# Patient Record
Sex: Female | Born: 1955 | Race: Black or African American | Hispanic: No | Smoking: Current some day smoker
Health system: Southern US, Community
[De-identification: ages and names within clinical notes are randomized; demographics above are authoritative.]

## PROBLEM LIST (undated history)

## (undated) DIAGNOSIS — R569 Unspecified convulsions: Secondary | ICD-10-CM

## (undated) DIAGNOSIS — J45909 Unspecified asthma, uncomplicated: Secondary | ICD-10-CM

## (undated) DIAGNOSIS — K219 Gastro-esophageal reflux disease without esophagitis: Secondary | ICD-10-CM

## (undated) DIAGNOSIS — M1990 Unspecified osteoarthritis, unspecified site: Secondary | ICD-10-CM

## (undated) DIAGNOSIS — F1910 Other psychoactive substance abuse, uncomplicated: Secondary | ICD-10-CM

## (undated) DIAGNOSIS — I10 Essential (primary) hypertension: Secondary | ICD-10-CM

## (undated) DIAGNOSIS — F1011 Alcohol abuse, in remission: Secondary | ICD-10-CM

## (undated) HISTORY — PX: ABDOMINAL HYSTERECTOMY: SHX81

## (undated) HISTORY — DX: Other psychoactive substance abuse, uncomplicated: F19.10

## (undated) HISTORY — PX: GALLBLADDER SURGERY: SHX652

## (undated) HISTORY — DX: Unspecified osteoarthritis, unspecified site: M19.90

## (undated) HISTORY — DX: Gastro-esophageal reflux disease without esophagitis: K21.9

## (undated) HISTORY — DX: Essential (primary) hypertension: I10

## (undated) HISTORY — PX: OTHER SURGICAL HISTORY: SHX169

---

## 1999-09-24 ENCOUNTER — Ambulatory Visit (HOSPITAL_COMMUNITY): Admission: RE | Admit: 1999-09-24 | Discharge: 1999-09-24 | Payer: Self-pay | Admitting: Internal Medicine

## 1999-09-24 ENCOUNTER — Encounter: Payer: Self-pay | Admitting: Internal Medicine

## 2000-02-15 ENCOUNTER — Encounter: Admission: RE | Admit: 2000-02-15 | Discharge: 2000-03-03 | Payer: Self-pay | Admitting: Specialist

## 2002-07-18 ENCOUNTER — Emergency Department (HOSPITAL_COMMUNITY): Admission: EM | Admit: 2002-07-18 | Discharge: 2002-07-18 | Payer: Self-pay | Admitting: Emergency Medicine

## 2002-07-21 ENCOUNTER — Emergency Department (HOSPITAL_COMMUNITY): Admission: EM | Admit: 2002-07-21 | Discharge: 2002-07-21 | Payer: Self-pay | Admitting: Emergency Medicine

## 2002-08-12 ENCOUNTER — Encounter: Payer: Self-pay | Admitting: Emergency Medicine

## 2002-08-12 ENCOUNTER — Emergency Department (HOSPITAL_COMMUNITY): Admission: EM | Admit: 2002-08-12 | Discharge: 2002-08-12 | Payer: Self-pay | Admitting: Emergency Medicine

## 2003-06-12 ENCOUNTER — Encounter: Payer: Self-pay | Admitting: Emergency Medicine

## 2003-06-12 ENCOUNTER — Emergency Department (HOSPITAL_COMMUNITY): Admission: EM | Admit: 2003-06-12 | Discharge: 2003-06-12 | Payer: Self-pay | Admitting: Emergency Medicine

## 2003-06-16 ENCOUNTER — Emergency Department (HOSPITAL_COMMUNITY): Admission: EM | Admit: 2003-06-16 | Discharge: 2003-06-16 | Payer: Self-pay | Admitting: Emergency Medicine

## 2003-07-19 ENCOUNTER — Emergency Department (HOSPITAL_COMMUNITY): Admission: EM | Admit: 2003-07-19 | Discharge: 2003-07-19 | Payer: Self-pay | Admitting: Emergency Medicine

## 2003-07-30 ENCOUNTER — Emergency Department (HOSPITAL_COMMUNITY): Admission: EM | Admit: 2003-07-30 | Discharge: 2003-07-30 | Payer: Self-pay | Admitting: Emergency Medicine

## 2004-06-08 ENCOUNTER — Other Ambulatory Visit: Payer: Self-pay

## 2004-07-03 ENCOUNTER — Emergency Department: Payer: Self-pay | Admitting: Emergency Medicine

## 2004-07-31 ENCOUNTER — Emergency Department: Payer: Self-pay | Admitting: Emergency Medicine

## 2004-07-31 ENCOUNTER — Other Ambulatory Visit: Payer: Self-pay

## 2004-08-23 ENCOUNTER — Emergency Department: Payer: Self-pay | Admitting: Emergency Medicine

## 2004-10-14 ENCOUNTER — Inpatient Hospital Stay: Payer: Self-pay | Admitting: Internal Medicine

## 2004-11-02 ENCOUNTER — Inpatient Hospital Stay: Payer: Self-pay | Admitting: Internal Medicine

## 2004-12-24 ENCOUNTER — Emergency Department: Payer: Self-pay | Admitting: Unknown Physician Specialty

## 2005-01-05 ENCOUNTER — Emergency Department: Payer: Self-pay | Admitting: Emergency Medicine

## 2005-06-15 ENCOUNTER — Emergency Department: Payer: Self-pay | Admitting: Unknown Physician Specialty

## 2005-10-08 ENCOUNTER — Other Ambulatory Visit: Payer: Self-pay

## 2005-10-08 ENCOUNTER — Emergency Department: Payer: Self-pay | Admitting: Emergency Medicine

## 2005-10-10 ENCOUNTER — Other Ambulatory Visit: Payer: Self-pay

## 2005-10-10 ENCOUNTER — Inpatient Hospital Stay: Payer: Self-pay | Admitting: Internal Medicine

## 2005-12-29 ENCOUNTER — Emergency Department: Payer: Self-pay | Admitting: Emergency Medicine

## 2006-08-31 ENCOUNTER — Emergency Department: Payer: Self-pay | Admitting: Emergency Medicine

## 2008-03-18 ENCOUNTER — Emergency Department: Payer: Self-pay | Admitting: Emergency Medicine

## 2008-03-20 ENCOUNTER — Emergency Department: Payer: Self-pay | Admitting: Emergency Medicine

## 2009-03-26 ENCOUNTER — Ambulatory Visit: Payer: Self-pay | Admitting: Cardiovascular Disease

## 2009-03-28 ENCOUNTER — Emergency Department: Payer: Self-pay | Admitting: Emergency Medicine

## 2009-05-08 ENCOUNTER — Emergency Department: Payer: Self-pay | Admitting: Emergency Medicine

## 2009-05-09 ENCOUNTER — Ambulatory Visit: Payer: Self-pay | Admitting: Gastroenterology

## 2010-04-22 ENCOUNTER — Emergency Department: Payer: Self-pay | Admitting: Unknown Physician Specialty

## 2010-05-03 ENCOUNTER — Emergency Department: Payer: Self-pay | Admitting: Emergency Medicine

## 2011-06-30 ENCOUNTER — Ambulatory Visit: Payer: Self-pay | Admitting: Family Medicine

## 2012-02-12 ENCOUNTER — Emergency Department: Payer: Self-pay | Admitting: Emergency Medicine

## 2012-02-25 ENCOUNTER — Emergency Department (HOSPITAL_COMMUNITY): Payer: Medicaid Other

## 2012-02-25 ENCOUNTER — Encounter (HOSPITAL_COMMUNITY): Payer: Self-pay | Admitting: *Deleted

## 2012-02-25 ENCOUNTER — Emergency Department (HOSPITAL_COMMUNITY)
Admission: EM | Admit: 2012-02-25 | Discharge: 2012-02-25 | Disposition: A | Discharge status: Home / Self Care | Payer: Medicaid Other | Attending: Emergency Medicine | Admitting: Emergency Medicine

## 2012-02-25 DIAGNOSIS — S52502A Unspecified fracture of the lower end of left radius, initial encounter for closed fracture: Secondary | ICD-10-CM

## 2012-02-25 DIAGNOSIS — R569 Unspecified convulsions: Secondary | ICD-10-CM | POA: Insufficient documentation

## 2012-02-25 DIAGNOSIS — S52539A Colles' fracture of unspecified radius, initial encounter for closed fracture: Secondary | ICD-10-CM | POA: Insufficient documentation

## 2012-02-25 DIAGNOSIS — S62639A Displaced fracture of distal phalanx of unspecified finger, initial encounter for closed fracture: Secondary | ICD-10-CM | POA: Insufficient documentation

## 2012-02-25 DIAGNOSIS — S62509A Fracture of unspecified phalanx of unspecified thumb, initial encounter for closed fracture: Secondary | ICD-10-CM

## 2012-02-25 DIAGNOSIS — W07XXXA Fall from chair, initial encounter: Secondary | ICD-10-CM | POA: Insufficient documentation

## 2012-02-25 LAB — COMPREHENSIVE METABOLIC PANEL
ALT: 14 U/L (ref 0–35)
Alkaline Phosphatase: 101 U/L (ref 39–117)
BUN: 15 mg/dL (ref 6–23)
CO2: 21 mEq/L (ref 19–32)
GFR calc Af Amer: >90 mL/min (ref >90)
GFR calc non Af Amer: >90 mL/min (ref >90)
Glucose, Bld: 80 mg/dL (ref 70–99)
Potassium: 4.2 mEq/L (ref 3.5–5.1)
Sodium: 139 mEq/L (ref 135–145)
Total Bilirubin: 0.3 mg/dL (ref 0.3–1.2)

## 2012-02-25 LAB — URINALYSIS, ROUTINE W REFLEX MICROSCOPIC
Bilirubin Urine: NEGATIVE
Ketones, ur: NEGATIVE mg/dL
Protein, ur: 30 mg/dL — AB
Urobilinogen, UA: 1 mg/dL (ref 0.0–1.0)

## 2012-02-25 LAB — PHENYTOIN LEVEL, TOTAL: Phenytoin Lvl: <2.5 ug/mL — ABNORMAL LOW (ref 10.0–20.0)

## 2012-02-25 LAB — URINE MICROSCOPIC-ADD ON

## 2012-02-25 LAB — DIFFERENTIAL
Lymphocytes Relative: 25 % (ref 12–46)
Lymphs Abs: 1.8 10*3/uL (ref 0.7–4.0)
Monocytes Relative: 9 % (ref 3–12)
Neutrophils Relative %: 66 % (ref 43–77)

## 2012-02-25 LAB — CBC
Hemoglobin: 12.2 g/dL (ref 12.0–15.0)
MCH: 30.7 pg (ref 26.0–34.0)
MCV: 88.9 fL (ref 78.0–100.0)
Platelets: 371 10*3/uL (ref 150–400)
RBC: 3.98 MIL/uL (ref 3.87–5.11)
WBC: 7.3 10*3/uL (ref 4.0–10.5)

## 2012-02-25 MED ORDER — LORAZEPAM 1 MG PO TABS
2.0000 mg | ORAL_TABLET | Freq: Once | ORAL | Status: AC
  Administered 2012-02-25: 2 mg via ORAL
  Filled 2012-02-25: qty 2

## 2012-02-25 MED ORDER — DIVALPROEX SODIUM 500 MG PO DR TAB
DELAYED_RELEASE_TABLET | ORAL | Status: AC
  Filled 2012-02-25: qty 1

## 2012-02-25 MED ORDER — DIVALPROEX SODIUM 500 MG PO DR TAB
500.0000 mg | DELAYED_RELEASE_TABLET | Freq: Once | ORAL | Status: AC
  Administered 2012-02-25: 500 mg via ORAL
  Filled 2012-02-25: qty 1

## 2012-02-25 MED ORDER — OXYCODONE-ACETAMINOPHEN 5-325 MG PO TABS
1.0000 | ORAL_TABLET | Freq: Once | ORAL | Status: AC
  Administered 2012-02-25: 1 via ORAL
  Filled 2012-02-25: qty 1

## 2012-02-25 MED ORDER — OXYCODONE-ACETAMINOPHEN 5-325 MG PO TABS: 1.0000 | ORAL_TABLET | Freq: Four times a day (QID) | ORAL | Status: AC | PRN

## 2012-02-25 NOTE — Discharge Instructions (Signed)
Cast or Splint Care  Casts and splints support injured limbs and keep bones from moving while they heal.   HOME CARE   Keep the cast or splint uncovered during the drying period.   A plaster cast can take 24 to 48 hours to dry.   A fiberglass cast will dry in less than 1 hour.   Do not rest the cast on anything harder than a pillow for 24 hours.   Do not put weight on your injured limb. Do not put pressure on the cast. Wait for your doctor's approval.   Keep the cast or splint dry.   Cover the cast or splint with a plastic bag during baths or wet weather.   If you have a cast over your chest and belly (trunk), take sponge baths until the cast is taken off.   Keep your cast or splint clean. Wash a dirty cast with a damp cloth.   Do not put any objects under your cast or splint. Do not scratch the skin under the cast with an object.   Do not take out the padding from inside your cast.   Exercise your joints near the cast as told by your doctor.   Raise (elevate) your injured limb on 1 or 2 pillows for the first 1 to 3 days.  GET HELP RIGHT AWAY IF:   Your cast or splint cracks.   Your cast or splint is too tight or too loose.   You itch badly under the cast.   Your cast gets wet or has a soft spot.   You have a bad smell coming from the cast.   You get an object stuck under the cast.   Your skin around the cast becomes red or raw.   You have new or more pain after the cast is put on.   You have fluid leaking through the cast.   You cannot move your fingers or toes.   Your fingers or toes turn colors or are cool, painful, or puffy (swollen).   You have tingling or lose feeling (numbness) around the injured area.   You have pain or pressure under the cast.   You have trouble breathing or have shortness of breath.   You have chest pain.  MAKE SURE YOU:   Understand these instructions.   Will watch your condition.   Will get help right away if you are not doing well or get worse.  Document  Released: 01/13/2011 Document Revised: 09/02/2011 Document Reviewed: 01/13/2011  Exit[employer] Patient Information 2012 [employer].

## 2012-02-25 NOTE — ED Provider Notes (Addendum)
History     CSN: [id number]  Arrival date & time 02/25/12  1736   First MD Initiated Contact with Patient 02/25/12 1824      Chief Complaint  Patient presents with  . Seizures    (Consider location/radiation/quality/duration/timing/severity/associated sxs/prior treatment) Patient is a 56 y.o. female presenting with seizures. The history is provided by the patient and a relative.  Seizures  This is a recurrent problem. The current episode started 3 to 5 hours ago. The problem has been resolved. There were 2 to 3 seizures. The most recent episode lasted 2 to 5 minutes. Associated symptoms include sleepiness. Pertinent negatives include no chest pain, no cough, no vomiting, no diarrhea and no muscle weakness. Characteristics include rhythmic jerking and loss of consciousness. Characteristics do not include bowel incontinence or bit tongue. The episode was witnessed. There was no sensation of an aura present. The seizures did not continue in the ED. The seizure(s) had no focality. Possible causes include missed seizure meds. Possible causes do not include recent illness or change in alcohol use. There has been no fever. There were no medications administered prior to arrival.    Past Medical History  Diagnosis Date  . Seizures     History reviewed. No pertinent past surgical history.  History reviewed. No pertinent family history.  History  Substance Use Topics  . Smoking status: Not on file  . Smokeless tobacco: Not on file  . Alcohol Use:     OB History    Grav Para Term Preterm Abortions TAB SAB Ect Mult Living                  Review of Systems  Respiratory: Negative for cough.   Cardiovascular: Negative for chest pain.  Gastrointestinal: Negative for vomiting, diarrhea and bowel incontinence.  Musculoskeletal:       Wrist and hip pain on the left since sz.  Had sz 2 weeks ago and fractured left wrist  Neurological: Positive for seizures and loss of consciousness.    All other systems reviewed and are negative.    Allergies  Penicillins  Home Medications   Current Outpatient Rx  Name Route Sig Dispense Refill  . DIVALPROEX SODIUM 500 MG PO TBEC Oral Take 500 mg by mouth 2 (two) times daily.      BP 185/102  Temp(Src) 97.9 F (36.6 C) (Oral)  Resp 20  SpO2 96%  Physical Exam  Nursing note and vitals reviewed. Constitutional: She is oriented to person, place, and time. She appears well-developed and well-nourished. No distress.  HENT:  Head: Normocephalic and atraumatic.  Mouth/Throat: Oropharynx is clear and moist.  Eyes: Conjunctivae and EOM are normal. Pupils are equal, round, and reactive to light.  Neck: Normal range of motion. Neck supple.  Cardiovascular: Normal rate, regular rhythm and intact distal pulses.   No murmur heard. Pulmonary/Chest: Effort normal and breath sounds normal. No respiratory distress. She has no wheezes. She has no rales.  Abdominal: Soft. She exhibits no distension. There is no tenderness. There is no rebound and no guarding.  Musculoskeletal: She exhibits no edema and no tenderness.       Left hip: She exhibits decreased range of motion, tenderness and bony tenderness. She exhibits no swelling and no deformity.       Arms: Neurological: She is alert and oriented to person, place, and time.  Skin: Skin is warm and dry. No rash noted. No erythema.  Psychiatric: She has a normal mood and affect. Her  behavior is normal.    ED Course  Procedures (including critical care time)  Labs Reviewed  CBC - Abnormal; Notable for the following:    HCT 35.4 (*)    All other components within normal limits  PHENYTOIN LEVEL, TOTAL - Abnormal; Notable for the following:    Phenytoin Lvl <2.5 (*)    All other components within normal limits  URINALYSIS, ROUTINE W REFLEX MICROSCOPIC - Abnormal; Notable for the following:    Hgb urine dipstick SMALL (*)    Protein, ur 30 (*)    All other components within normal  limits  VALPROIC ACID LEVEL - Abnormal; Notable for the following:    Valproic Acid Lvl 14.9 (*)    All other components within normal limits  URINE MICROSCOPIC-ADD ON - Abnormal; Notable for the following:    Bacteria, UA FEW (*)    All other components within normal limits  DIFFERENTIAL  COMPREHENSIVE METABOLIC PANEL   Dg Wrist Complete Left  02/25/2012  *RADIOLOGY REPORT*  Clinical Data: Wrist fracture  LEFT WRIST - COMPLETE 3+ VIEW  Comparison: None  Findings: Exam detail is obscured by a overlying splint material.  There is a comminuted fracture deformity involving the distal radius.  There is dorsal angulation of the distal fracture fragments.  Nondisplaced ulnar styloid fracture is suspected.  IMPRESSION:  1.  Comminuted distal radius fracture with dorsal angulation of the distal fracture fragments.  Per CMS PQRS reporting requirements (PQRS Measure 24): Given the patient's age of greater than 50 and the fracture site (hip, distal radius, or spine), the patient should be tested for osteoporosis using DXA, and the appropriate treatment considered based on the DXA results.  Original Report Authenticated By: Rosealee Albee, M.D.   Dg Hip Complete Left  02/25/2012  *RADIOLOGY REPORT*  Clinical Data: Seizure, anterior left hip pain.  LEFT HIP - COMPLETE 2+ VIEW  Comparison: None.  Findings: Mild degenerative joint disease changes.  There is a rim of osteophytes about the left hip joint. No acute bony abnormality. Specifically, no fracture, subluxation, or dislocation.  Soft tissues are intact.  SI joints are symmetric and unremarkable. Right hip joint unremarkable.  IMPRESSION: Degenerative changes in the left hip.  No acute bony abnormality.  Original Report Authenticated By: Cyndie Chime, M.D.   Dg Hand Complete Right  02/25/2012  *RADIOLOGY REPORT*  Clinical Data: Seizure.  Pain to the first metacarpal bone.  RIGHT HAND - COMPLETE 3+ VIEW  Comparison: None  Findings: There is a fracture  deformity involving the base of the first distal phalanx.  The fracture fragments are in anatomic alignment.  No additional fractures or subluxations identified.  No radiopaque foreign bodies or soft tissue calcifications.  IMPRESSION:  1.  Acute fracture involves the base of the first distal phalanx.  Original Report Authenticated By: Rosealee Albee, M.D.     1. Seizure   2. Thumb fracture   3. Distal radius fracture, left       MDM   Patient with a seizure disorder who is on Depakote 500 mg twice a day had 2 seizures today. She states she forgot to take her medication today which is most likely the reason why she had the seizure. She had a seizure 2 weeks ago breaking her left wrist and states when she had a seizure today she fell out of the chair onto her left side and so her left wrist and left hip her very painful.  Family denies her hitting  her head or any other injury.  Patient is well-appearing on exam but does have significant left hip tenderness. She is in the left wrist splints but has to capillary refill and movement of her fingers. Also she has pain and swelling of her right thumb Will check Depakote level. Patient given Ativan to prevent further seizures and plain film pending to evaluate for acute injury.  10:58 PM Patient with a low Depakote level of 14 it was given a load of 500 and she will continue her normal dose. Wrist films showed a comminuted distal radius fracture with dorsal angulation which is from her initial fall. She has followup with the orthopedist in [place] already and wants to continue that followup. Also she was found to have an acute fracture of the base of the first distal phalanx and was placed in a splint. Her left hip film was negative for acute injury but did have arthritis. This was all discussed with pt and she was d/ced home.        Gwyneth Sprout, MD 02/25/12 2300  Gwyneth Sprout, MD 02/25/12 2303

## 2012-02-25 NOTE — ED Notes (Signed)
Pt in via EMS c/o seizures x2 today, each lasting approx 5 min, witnessed by family, pt is taking dilantin for this but has not taken it today, pt had seizure last week and fractured left wrist

## 2012-09-07 ENCOUNTER — Ambulatory Visit: Payer: Self-pay | Admitting: Family Medicine

## 2012-09-15 ENCOUNTER — Emergency Department: Payer: Self-pay | Admitting: Emergency Medicine

## 2012-09-15 LAB — ETHANOL
Ethanol %: <0.003 % (ref 0.000–0.080)
Ethanol: <3 mg/dL

## 2012-09-15 LAB — COMPREHENSIVE METABOLIC PANEL
Albumin: 3.4 g/dL (ref 3.4–5.0)
Anion Gap: 11 (ref 7–16)
BUN: 12 mg/dL (ref 7–18)
Bilirubin,Total: 1 mg/dL (ref 0.2–1.0)
Calcium, Total: 8.8 mg/dL (ref 8.5–10.1)
Chloride: 110 mmol/L — ABNORMAL HIGH (ref 98–107)
Co2: 18 mmol/L — ABNORMAL LOW (ref 21–32)
Creatinine: 0.92 mg/dL (ref 0.60–1.30)
EGFR (Non-African Amer.): >60
Osmolality: 276 (ref 275–301)
Potassium: 4.2 mmol/L (ref 3.5–5.1)
SGPT (ALT): 19 U/L (ref 12–78)
Sodium: 139 mmol/L (ref 136–145)
Total Protein: 7.7 g/dL (ref 6.4–8.2)

## 2012-09-15 LAB — CBC
HGB: 14.7 g/dL (ref 12.0–16.0)
MCHC: 34 g/dL (ref 32.0–36.0)
Platelet: 352 10*3/uL (ref 150–440)
RBC: 4.59 10*6/uL (ref 3.80–5.20)

## 2013-02-01 ENCOUNTER — Emergency Department: Payer: Self-pay | Admitting: Emergency Medicine

## 2013-02-01 LAB — CBC
MCH: 30.3 pg (ref 26.0–34.0)
MCV: 90 fL (ref 80–100)
Platelet: 321 10*3/uL (ref 150–440)
RDW: 14.8 % — ABNORMAL HIGH (ref 11.5–14.5)
WBC: 5 10*3/uL (ref 3.6–11.0)

## 2013-02-01 LAB — URINALYSIS, COMPLETE
Glucose,UR: NEGATIVE mg/dL (ref 0–75)
Ketone: NEGATIVE
Nitrite: NEGATIVE
Ph: 6 (ref 4.5–8.0)
Protein: NEGATIVE
RBC,UR: 1 /HPF (ref 0–5)
Specific Gravity: 1.005 (ref 1.003–1.030)
Squamous Epithelial: 1
WBC UR: 1 /HPF (ref 0–5)

## 2013-02-01 LAB — COMPREHENSIVE METABOLIC PANEL
Albumin: 3.7 g/dL (ref 3.4–5.0)
Alkaline Phosphatase: 106 U/L (ref 50–136)
BUN: 6 mg/dL — ABNORMAL LOW (ref 7–18)
Bilirubin,Total: 0.4 mg/dL (ref 0.2–1.0)
Calcium, Total: 9.1 mg/dL (ref 8.5–10.1)
EGFR (African American): >60
EGFR (Non-African Amer.): >60
Osmolality: 280 (ref 275–301)
Potassium: 3.6 mmol/L (ref 3.5–5.1)
SGOT(AST): 23 U/L (ref 15–37)
SGPT (ALT): 15 U/L (ref 12–78)
Sodium: 142 mmol/L (ref 136–145)

## 2013-02-01 LAB — DRUG SCREEN, URINE
Amphetamines, Ur Screen: NEGATIVE (ref ?–1000)
Barbiturates, Ur Screen: NEGATIVE (ref ?–200)
Cannabinoid 50 Ng, Ur ~~LOC~~: NEGATIVE (ref ?–50)
Methadone, Ur Screen: NEGATIVE (ref ?–300)
Tricyclic, Ur Screen: NEGATIVE (ref ?–1000)

## 2013-02-01 LAB — ETHANOL: Ethanol: 196 mg/dL

## 2013-02-01 LAB — TSH: Thyroid Stimulating Horm: 0.134 u[IU]/mL — ABNORMAL LOW

## 2013-10-15 ENCOUNTER — Inpatient Hospital Stay: Payer: Self-pay | Admitting: Family Medicine

## 2013-10-15 LAB — CBC
HCT: 42.3 % (ref 35.0–47.0)
HGB: 14.1 g/dL (ref 12.0–16.0)
MCH: 31.6 pg (ref 26.0–34.0)
MCHC: 33.5 g/dL (ref 32.0–36.0)
MCV: 94 fL (ref 80–100)
PLATELETS: 394 10*3/uL (ref 150–440)
RBC: 4.48 10*6/uL (ref 3.80–5.20)
RDW: 14.3 % (ref 11.5–14.5)
WBC: 8.5 10*3/uL (ref 3.6–11.0)

## 2013-10-15 LAB — URINALYSIS, COMPLETE
Bilirubin,UR: NEGATIVE
Glucose,UR: NEGATIVE mg/dL (ref 0–75)
KETONE: NEGATIVE
LEUKOCYTE ESTERASE: NEGATIVE
Nitrite: NEGATIVE
Ph: 5 (ref 4.5–8.0)
Protein: 30
RBC,UR: 2 /HPF (ref 0–5)
SPECIFIC GRAVITY: 1.014 (ref 1.003–1.030)
Squamous Epithelial: <1
WBC UR: <1 /HPF (ref 0–5)

## 2013-10-15 LAB — DRUG SCREEN, URINE
Amphetamines, Ur Screen: NEGATIVE (ref ?–1000)
BENZODIAZEPINE, UR SCRN: NEGATIVE (ref ?–200)
Barbiturates, Ur Screen: NEGATIVE (ref ?–200)
COCAINE METABOLITE, UR ~~LOC~~: NEGATIVE (ref ?–300)
Cannabinoid 50 Ng, Ur ~~LOC~~: POSITIVE (ref ?–50)
MDMA (Ecstasy)Ur Screen: NEGATIVE (ref ?–500)
METHADONE, UR SCREEN: NEGATIVE (ref ?–300)
Opiate, Ur Screen: NEGATIVE (ref ?–300)
Phencyclidine (PCP) Ur S: NEGATIVE (ref ?–25)
Tricyclic, Ur Screen: NEGATIVE (ref ?–1000)

## 2013-10-15 LAB — HEPATIC FUNCTION PANEL A (ARMC)
ALT: 23 U/L (ref 12–78)
Albumin: 3.8 g/dL (ref 3.4–5.0)
Alkaline Phosphatase: 122 U/L — ABNORMAL HIGH
BILIRUBIN DIRECT: 0.1 mg/dL (ref 0.00–0.20)
Bilirubin,Total: 0.5 mg/dL (ref 0.2–1.0)
SGOT(AST): 38 U/L — ABNORMAL HIGH (ref 15–37)
TOTAL PROTEIN: 8.3 g/dL — AB (ref 6.4–8.2)

## 2013-10-15 LAB — BASIC METABOLIC PANEL
ANION GAP: 8 (ref 7–16)
BUN: 19 mg/dL — AB (ref 7–18)
CHLORIDE: 107 mmol/L (ref 98–107)
CO2: 23 mmol/L (ref 21–32)
Calcium, Total: 9.2 mg/dL (ref 8.5–10.1)
Creatinine: 0.82 mg/dL (ref 0.60–1.30)
EGFR (African American): >60
EGFR (Non-African Amer.): >60
Glucose: 81 mg/dL (ref 65–99)
OSMOLALITY: 277 (ref 275–301)
Potassium: 3.8 mmol/L (ref 3.5–5.1)
SODIUM: 138 mmol/L (ref 136–145)

## 2013-10-15 LAB — VALPROIC ACID LEVEL: Valproic Acid: 42 ug/mL — ABNORMAL LOW

## 2013-10-15 LAB — ETHANOL: Ethanol: <3 mg/dL

## 2013-10-16 LAB — BASIC METABOLIC PANEL
Anion Gap: 5 — ABNORMAL LOW (ref 7–16)
BUN: 15 mg/dL (ref 7–18)
CREATININE: 0.76 mg/dL (ref 0.60–1.30)
Calcium, Total: 8.5 mg/dL (ref 8.5–10.1)
Chloride: 107 mmol/L (ref 98–107)
Co2: 27 mmol/L (ref 21–32)
EGFR (Non-African Amer.): >60
GLUCOSE: 74 mg/dL (ref 65–99)
OSMOLALITY: 277 (ref 275–301)
Potassium: 3.4 mmol/L — ABNORMAL LOW (ref 3.5–5.1)
SODIUM: 139 mmol/L (ref 136–145)

## 2013-10-16 LAB — CBC WITH DIFFERENTIAL/PLATELET
BASOS PCT: 0.6 %
Basophil #: 0 10*3/uL (ref 0.0–0.1)
EOS ABS: 0 10*3/uL (ref 0.0–0.7)
Eosinophil %: 0.5 %
HCT: 35.9 % (ref 35.0–47.0)
HGB: 12.1 g/dL (ref 12.0–16.0)
LYMPHS PCT: 33.1 %
Lymphocyte #: 2.5 10*3/uL (ref 1.0–3.6)
MCH: 31.8 pg (ref 26.0–34.0)
MCHC: 33.8 g/dL (ref 32.0–36.0)
MCV: 94 fL (ref 80–100)
MONO ABS: 0.9 x10 3/mm (ref 0.2–0.9)
Monocyte %: 11.6 %
Neutrophil #: 4.1 10*3/uL (ref 1.4–6.5)
Neutrophil %: 54.2 %
PLATELETS: 319 10*3/uL (ref 150–440)
RBC: 3.81 10*6/uL (ref 3.80–5.20)
RDW: 14.2 % (ref 11.5–14.5)
WBC: 7.5 10*3/uL (ref 3.6–11.0)

## 2013-10-16 LAB — MAGNESIUM: Magnesium: 2 mg/dL

## 2013-10-16 LAB — TSH: Thyroid Stimulating Horm: 1.19 u[IU]/mL

## 2013-10-17 LAB — BASIC METABOLIC PANEL
Anion Gap: 5 — ABNORMAL LOW (ref 7–16)
BUN: 18 mg/dL (ref 7–18)
CALCIUM: 8.7 mg/dL (ref 8.5–10.1)
CREATININE: 0.79 mg/dL (ref 0.60–1.30)
Chloride: 108 mmol/L — ABNORMAL HIGH (ref 98–107)
Co2: 23 mmol/L (ref 21–32)
EGFR (Non-African Amer.): >60
Glucose: 134 mg/dL — ABNORMAL HIGH (ref 65–99)
OSMOLALITY: 276 (ref 275–301)
Potassium: 3.5 mmol/L (ref 3.5–5.1)
Sodium: 136 mmol/L (ref 136–145)

## 2013-11-05 ENCOUNTER — Emergency Department (HOSPITAL_COMMUNITY): Payer: Medicaid Other

## 2013-11-05 ENCOUNTER — Encounter (HOSPITAL_COMMUNITY): Payer: Self-pay | Admitting: Emergency Medicine

## 2013-11-05 ENCOUNTER — Inpatient Hospital Stay (HOSPITAL_COMMUNITY)
Admission: EM | Admit: 2013-11-05 | Discharge: 2013-11-09 | DRG: 100 | Disposition: A | Discharge status: Home / Self Care | Payer: Medicaid Other | Attending: Internal Medicine | Admitting: Internal Medicine

## 2013-11-05 DIAGNOSIS — R509 Fever, unspecified: Secondary | ICD-10-CM

## 2013-11-05 DIAGNOSIS — Z91199 Patient's noncompliance with other medical treatment and regimen due to unspecified reason: Secondary | ICD-10-CM

## 2013-11-05 DIAGNOSIS — Z598 Other problems related to housing and economic circumstances: Secondary | ICD-10-CM

## 2013-11-05 DIAGNOSIS — Z9119 Patient's noncompliance with other medical treatment and regimen: Secondary | ICD-10-CM

## 2013-11-05 DIAGNOSIS — L509 Urticaria, unspecified: Secondary | ICD-10-CM | POA: Diagnosis present

## 2013-11-05 DIAGNOSIS — G0490 Encephalitis and encephalomyelitis, unspecified: Secondary | ICD-10-CM

## 2013-11-05 DIAGNOSIS — R21 Rash and other nonspecific skin eruption: Secondary | ICD-10-CM | POA: Diagnosis present

## 2013-11-05 DIAGNOSIS — F1011 Alcohol abuse, in remission: Secondary | ICD-10-CM | POA: Diagnosis present

## 2013-11-05 DIAGNOSIS — L299 Pruritus, unspecified: Secondary | ICD-10-CM | POA: Diagnosis present

## 2013-11-05 DIAGNOSIS — F1020 Alcohol dependence, uncomplicated: Secondary | ICD-10-CM | POA: Diagnosis present

## 2013-11-05 DIAGNOSIS — R4182 Altered mental status, unspecified: Secondary | ICD-10-CM | POA: Diagnosis present

## 2013-11-05 DIAGNOSIS — H5316 Psychophysical visual disturbances: Secondary | ICD-10-CM | POA: Diagnosis present

## 2013-11-05 DIAGNOSIS — F10939 Alcohol use, unspecified with withdrawal, unspecified: Secondary | ICD-10-CM | POA: Diagnosis present

## 2013-11-05 DIAGNOSIS — F1210 Cannabis abuse, uncomplicated: Secondary | ICD-10-CM | POA: Diagnosis present

## 2013-11-05 DIAGNOSIS — F1010 Alcohol abuse, uncomplicated: Secondary | ICD-10-CM | POA: Diagnosis present

## 2013-11-05 DIAGNOSIS — Z5987 Material hardship due to limited financial resources, not elsewhere classified: Secondary | ICD-10-CM

## 2013-11-05 DIAGNOSIS — F10239 Alcohol dependence with withdrawal, unspecified: Secondary | ICD-10-CM

## 2013-11-05 DIAGNOSIS — R569 Unspecified convulsions: Secondary | ICD-10-CM | POA: Diagnosis present

## 2013-11-05 DIAGNOSIS — G40309 Generalized idiopathic epilepsy and epileptic syndromes, not intractable, without status epilepticus: Principal | ICD-10-CM | POA: Diagnosis present

## 2013-11-05 DIAGNOSIS — G9340 Encephalopathy, unspecified: Secondary | ICD-10-CM | POA: Diagnosis present

## 2013-11-05 DIAGNOSIS — IMO0002 Reserved for concepts with insufficient information to code with codable children: Secondary | ICD-10-CM

## 2013-11-05 LAB — URINALYSIS, ROUTINE W REFLEX MICROSCOPIC
Bilirubin Urine: NEGATIVE
Glucose, UA: NEGATIVE mg/dL
Ketones, ur: 15 mg/dL — AB
Leukocytes, UA: NEGATIVE
Nitrite: NEGATIVE
Protein, ur: 100 mg/dL — AB
Specific Gravity, Urine: 1.018 (ref 1.005–1.030)
Urobilinogen, UA: 0.2 mg/dL (ref 0.0–1.0)
pH: 5.5 (ref 5.0–8.0)

## 2013-11-05 LAB — BASIC METABOLIC PANEL
BUN: 10 mg/dL (ref 6–23)
CO2: 22 mEq/L (ref 19–32)
Calcium: 9.2 mg/dL (ref 8.4–10.5)
Chloride: 103 mEq/L (ref 96–112)
Creatinine, Ser: 0.7 mg/dL (ref 0.50–1.10)
GFR calc Af Amer: >90 mL/min (ref >90)
GFR calc non Af Amer: >90 mL/min (ref >90)
Glucose, Bld: 91 mg/dL (ref 70–99)
Potassium: 3.8 mEq/L (ref 3.7–5.3)
Sodium: 140 mEq/L (ref 137–147)

## 2013-11-05 LAB — CBC
HCT: 38.9 % (ref 36.0–46.0)
Hemoglobin: 13.3 g/dL (ref 12.0–15.0)
MCH: 31.3 pg (ref 26.0–34.0)
MCHC: 34.2 g/dL (ref 30.0–36.0)
MCV: 91.5 fL (ref 78.0–100.0)
Platelets: 454 10*3/uL — ABNORMAL HIGH (ref 150–400)
RBC: 4.25 MIL/uL (ref 3.87–5.11)
RDW: 13.9 % (ref 11.5–15.5)
WBC: 9.8 10*3/uL (ref 4.0–10.5)

## 2013-11-05 LAB — RAPID URINE DRUG SCREEN, HOSP PERFORMED
Amphetamines: NOT DETECTED
Barbiturates: NOT DETECTED
Benzodiazepines: NOT DETECTED
Cocaine: NOT DETECTED
Opiates: NOT DETECTED
Tetrahydrocannabinol: POSITIVE — AB

## 2013-11-05 LAB — URINE MICROSCOPIC-ADD ON

## 2013-11-05 LAB — ETHANOL: Alcohol, Ethyl (B): <11 mg/dL (ref 0–11)

## 2013-11-05 LAB — VALPROIC ACID LEVEL: Valproic Acid Lvl: <10 ug/mL — ABNORMAL LOW (ref 50.0–100.0)

## 2013-11-05 MED ORDER — VALPROATE SODIUM 500 MG/5ML IV SOLN
1350.0000 mg | Freq: Once | INTRAVENOUS | Status: AC
  Administered 2013-11-06: 1350 mg via INTRAVENOUS
  Filled 2013-11-05: qty 13.5

## 2013-11-05 MED ORDER — LORAZEPAM 1 MG PO TABS
2.0000 mg | ORAL_TABLET | Freq: Once | ORAL | Status: AC
  Administered 2013-11-05: 2 mg via ORAL
  Filled 2013-11-05: qty 2

## 2013-11-05 MED ORDER — VANCOMYCIN HCL IN DEXTROSE 1-5 GM/200ML-% IV SOLN
1000.0000 mg | Freq: Once | INTRAVENOUS | Status: AC
  Administered 2013-11-06: 1000 mg via INTRAVENOUS
  Filled 2013-11-05: qty 200

## 2013-11-05 MED ORDER — ACETAMINOPHEN 325 MG PO TABS
650.0000 mg | ORAL_TABLET | Freq: Once | ORAL | Status: AC
  Administered 2013-11-05: 650 mg via ORAL
  Filled 2013-11-05: qty 2

## 2013-11-05 MED ORDER — SODIUM CHLORIDE 0.9 % IV BOLUS (SEPSIS)
1000.0000 mL | INTRAVENOUS | Status: AC
  Administered 2013-11-06: 1000 mL via INTRAVENOUS

## 2013-11-05 MED ORDER — SODIUM CHLORIDE 0.9 % IV SOLN
2.0000 g | Freq: Once | INTRAVENOUS | Status: AC
  Administered 2013-11-06: 2 g via INTRAVENOUS
  Filled 2013-11-05: qty 2000

## 2013-11-05 MED ORDER — DEXTROSE 5 % IV SOLN
2.0000 g | Freq: Once | INTRAVENOUS | Status: AC
  Administered 2013-11-06: 2 g via INTRAVENOUS
  Filled 2013-11-05: qty 2

## 2013-11-05 NOTE — ED Notes (Signed)
Pt not cooperating with blood draw/IV stick. Nurse explained to pt how important it was to have IV incase shehas another seizure but pt did not want to reason. Both numbers for family on file were called and both were disconnected. Hilda Blades PA notified.

## 2013-11-05 NOTE — ED Notes (Signed)
Bed: WA07 Expected date:  Expected time:  Means of arrival:  Comments: EMS-sz

## 2013-11-05 NOTE — ED Provider Notes (Signed)
CSN: [id number]     Arrival date & time 11/05/13  1759 History   First MD Initiated Contact with Patient 11/05/13 1818     Chief Complaint  Patient presents with  . Seizures     (Consider location/radiation/quality/duration/timing/severity/associated sxs/prior Treatment) HPI Pt is a 58yo female with hx of seizures brought in by EMS.  Per family via EMS, pt had 2 grand mal seizures about 13min apart, lasting about 3 minutes each. Pt has hx of seizures, alcohol abuse, and medication non-compliance.  Daughter thinks last time pt took keppra and depakote was Jan 27 when pt last left the hospital.  No medical records in [place] system to indicate recent ED visit or hospitalization.  Per family, pt has been drinking alcohol since then. Pt is hypersensitive to stimuli, disordered for last 15-37min. Hx limited as family is not in ED and pt has AMS.  Past Medical History  Diagnosis Date  . Seizures    History reviewed. No pertinent past surgical history. No family history on file. History  Substance Use Topics  . Smoking status: Never Smoker   . Smokeless tobacco: Not on file  . Alcohol Use: Yes   OB History   Grav Para Term Preterm Abortions TAB SAB Ect Mult Living                 Review of Systems  Unable to perform ROS: Mental status change     Allergies  Morphine and related and Penicillins  Home Medications   Current Outpatient Rx  Name  Route  Sig  Dispense  Refill  . divalproex (DEPAKOTE) 500 MG DR tablet   Oral   Take 500 mg by mouth 2 (two) times daily.          BP 177/102  Pulse 111  Temp(Src) 101.2 F (38.4 C) (Rectal)  Resp 20  Ht 5\' 8"  (1.727 m)  Wt 150 lb (68.04 kg)  BMI 22.81 kg/m2  SpO2 97% Physical Exam  Nursing note and vitals reviewed. Constitutional:  Thin female lying in exam bed, appears older than stated age. Covered with blankets, mild shaking.   HENT:  Head: Normocephalic.    Eyes: Conjunctivae are normal. No scleral icterus.  Neck:  Normal range of motion.  Cardiovascular: Normal rate, regular rhythm and normal heart sounds.   Pulmonary/Chest: Effort normal and breath sounds normal. No respiratory distress. She has no wheezes. She has no rales. She exhibits no tenderness.  Abdominal: Soft. Bowel sounds are normal. She exhibits no distension and no mass. There is no tenderness. There is no rebound and no guarding.  Musculoskeletal: Normal range of motion.  Neurological: She is alert.  Skin: Skin is warm and dry.  Psychiatric: She is actively hallucinating ( auditory and visual). Thought content is paranoid. She expresses no homicidal and no suicidal ideation. She expresses no suicidal plans and no homicidal plans.    ED Course  Procedures (including critical care time) Labs Review Labs Reviewed  URINE RAPID DRUG SCREEN (HOSP PERFORMED) - Abnormal; Notable for the following:    Tetrahydrocannabinol POSITIVE (*)    All other components within normal limits  URINALYSIS, ROUTINE W REFLEX MICROSCOPIC - Abnormal; Notable for the following:    Hgb urine dipstick SMALL (*)    Ketones, ur 15 (*)    Protein, ur 100 (*)    All other components within normal limits  CBC - Abnormal; Notable for the following:    Platelets 454 (*)    All  other components within normal limits  VALPROIC ACID LEVEL - Abnormal; Notable for the following:    Valproic Acid Lvl <10.0 (*)    All other components within normal limits  CULTURE, BLOOD (ROUTINE X 2)  CULTURE, BLOOD (ROUTINE X 2)  URINE CULTURE  CSF CULTURE  GRAM STAIN  ETHANOL  BASIC METABOLIC PANEL  URINE MICROSCOPIC-ADD ON  CSF CELL COUNT WITH DIFFERENTIAL  PROTEIN, CSF  GLUCOSE, CSF   Imaging Review Ct Head Wo Contrast  11/05/2013   CLINICAL DATA:  Seizure, altered mental status.  EXAM: CT HEAD WITHOUT CONTRAST  TECHNIQUE: Contiguous axial images were obtained from the base of the skull through the vertex without intravenous contrast.  COMPARISON:  None.  FINDINGS: Bony  calvarium appears intact. Small right frontal scalp hematoma is noted. No mass effect or midline shift is noted. Ventricular size is within normal limits. There is no evidence of mass lesion, hemorrhage or acute infarction.  IMPRESSION: Small right frontal scalp hematoma. No acute intracranial abnormality seen.   Electronically Signed   By: Sabino Dick M.D.   On: 11/05/2013 20:35   Dg Chest Portable 1 View  11/05/2013   CLINICAL DATA:  Altered mental status.  Seizures.  EXAM: PORTABLE CHEST - 1 VIEW  COMPARISON:  No comparisons  FINDINGS: Cardiopericardial silhouette within normal limits. Mediastinal contours normal. Trachea midline. No airspace disease or effusion. Monitoring leads project over the chest. Mild basilar atelectasis on the right.  IMPRESSION: No acute cardiopulmonary disease.   Electronically Signed   By: Dereck Ligas M.D.   On: 11/05/2013 21:15    EKG Interpretation   None       MDM   Final diagnoses:  None   Pt with hx of seizures, alcohol abuse, and medication non-compliance brought in by EMS for 2 grand mal seizures and AMS.  19:58  PM Pt has been given 2mg  ativan as she is uncooperative for lab draws.    Labs: CBC and BMP: unremarkable. Urine: unremarkable Etoh: negative Urine Drug: positive for tetrahydrocannabinol  Valproic acid: <10.0, below therapeutic level, suspected due to non-compliance as reported by family. Acetaminophen given at 21:06   11:10 PM Consulted with Dr. Leonel Ramsay, neurology, recommended LP due to fever, seizures, as well as continued AMS.  Recommended loading dose for Depakote, 20mg /kg   11:36 PM Pt is not cooperative and is refusing LP.  Pt is stable, non-toxic appearing, discussed pt with Dr. Aline Brochure.  Will consult hospitalist to discuss empiric tx for fever.     1:01 AM  Pt appears more alert and oriented at this time and was agreeable to LP.  LP was able to be performed by Dr. Aline Brochure.  3 tubes sent for testing, 1 tube being held  for hospitalist for other testing as needed. Pt will be admitted by Dr. Posey Pronto.   Noland Fordyce, PA-C 11/06/13 0110

## 2013-11-05 NOTE — ED Notes (Signed)
Per family, pt had 2 grand mal seizures about 10 minutes apart lasting about 3 minutes. Pt has hx of seizures, alcohol abuse, and medication non-compliance. Daughter thinks the last time pt took keppra and depakote was Jan 27 when she last left the hospital. Pt has been drinking alcohol since then. Pt is hypersensitive to stimuli, disoriented for the past 15-20 minutes.

## 2013-11-05 NOTE — ED Notes (Signed)
Pt given PO ativan in hopes that she will be able to have IV later. In CT at this time.

## 2013-11-06 ENCOUNTER — Inpatient Hospital Stay (HOSPITAL_COMMUNITY)
Admit: 2013-11-06 | Discharge: 2013-11-06 | Disposition: A | Discharge status: Home / Self Care | Payer: Medicaid Other | Attending: Internal Medicine | Admitting: Internal Medicine

## 2013-11-06 DIAGNOSIS — G0491 Myelitis, unspecified: Secondary | ICD-10-CM

## 2013-11-06 DIAGNOSIS — G0490 Encephalitis and encephalomyelitis, unspecified: Secondary | ICD-10-CM

## 2013-11-06 DIAGNOSIS — F1011 Alcohol abuse, in remission: Secondary | ICD-10-CM | POA: Diagnosis present

## 2013-11-06 DIAGNOSIS — F1210 Cannabis abuse, uncomplicated: Secondary | ICD-10-CM | POA: Diagnosis present

## 2013-11-06 DIAGNOSIS — F10939 Alcohol use, unspecified with withdrawal, unspecified: Secondary | ICD-10-CM | POA: Diagnosis present

## 2013-11-06 DIAGNOSIS — F10239 Alcohol dependence with withdrawal, unspecified: Secondary | ICD-10-CM | POA: Diagnosis present

## 2013-11-06 DIAGNOSIS — R569 Unspecified convulsions: Secondary | ICD-10-CM

## 2013-11-06 DIAGNOSIS — F1010 Alcohol abuse, uncomplicated: Secondary | ICD-10-CM | POA: Diagnosis present

## 2013-11-06 DIAGNOSIS — R4182 Altered mental status, unspecified: Secondary | ICD-10-CM

## 2013-11-06 DIAGNOSIS — R509 Fever, unspecified: Secondary | ICD-10-CM

## 2013-11-06 LAB — CSF CELL COUNT WITH DIFFERENTIAL
RBC COUNT CSF: 0 /mm3
TUBE #: 3
WBC, CSF: 1 /mm3 (ref 0–5)

## 2013-11-06 LAB — CBC
HEMATOCRIT: 37.7 % (ref 36.0–46.0)
Hemoglobin: 12.9 g/dL (ref 12.0–15.0)
MCH: 31 pg (ref 26.0–34.0)
MCHC: 34.2 g/dL (ref 30.0–36.0)
MCV: 90.6 fL (ref 78.0–100.0)
Platelets: 475 10*3/uL — ABNORMAL HIGH (ref 150–400)
RBC: 4.16 MIL/uL (ref 3.87–5.11)
RDW: 13.7 % (ref 11.5–15.5)
WBC: 8.9 10*3/uL (ref 4.0–10.5)

## 2013-11-06 LAB — COMPREHENSIVE METABOLIC PANEL
ALT: 11 U/L (ref 0–35)
AST: 22 U/L (ref 0–37)
Albumin: 3.6 g/dL (ref 3.5–5.2)
Alkaline Phosphatase: 93 U/L (ref 39–117)
BUN: 9 mg/dL (ref 6–23)
CALCIUM: 8.8 mg/dL (ref 8.4–10.5)
CO2: 23 mEq/L (ref 19–32)
CREATININE: 0.7 mg/dL (ref 0.50–1.10)
Chloride: 102 mEq/L (ref 96–112)
GLUCOSE: 90 mg/dL (ref 70–99)
Potassium: 4 mEq/L (ref 3.7–5.3)
Sodium: 139 mEq/L (ref 137–147)
TOTAL PROTEIN: 6.9 g/dL (ref 6.0–8.3)
Total Bilirubin: 1.1 mg/dL (ref 0.3–1.2)

## 2013-11-06 LAB — LEGIONELLA ANTIGEN, URINE: LEGIONELLA ANTIGEN, URINE: NEGATIVE

## 2013-11-06 LAB — GLUCOSE, CSF: GLUCOSE CSF: 69 mg/dL (ref 43–76)

## 2013-11-06 LAB — GRAM STAIN: Gram Stain: NONE SEEN

## 2013-11-06 LAB — MRSA PCR SCREENING: MRSA by PCR: NEGATIVE

## 2013-11-06 LAB — PROTEIN, CSF: TOTAL PROTEIN, CSF: 21 mg/dL (ref 15–45)

## 2013-11-06 LAB — PROTIME-INR
INR: 1.04 (ref 0.00–1.49)
Prothrombin Time: 13.4 seconds (ref 11.6–15.2)

## 2013-11-06 LAB — STREP PNEUMONIAE URINARY ANTIGEN: STREP PNEUMO URINARY ANTIGEN: NEGATIVE

## 2013-11-06 MED ORDER — SODIUM CHLORIDE 0.9 % IV SOLN
INTRAVENOUS | Status: AC
  Administered 2013-11-06: 02:00:00 via INTRAVENOUS

## 2013-11-06 MED ORDER — ONDANSETRON HCL 4 MG/2ML IJ SOLN: 4.0000 mg | Freq: Four times a day (QID) | INTRAMUSCULAR | Status: DC | PRN

## 2013-11-06 MED ORDER — LORAZEPAM 2 MG/ML IJ SOLN
1.0000 mg | Freq: Four times a day (QID) | INTRAMUSCULAR | Status: AC | PRN
  Administered 2013-11-06 – 2013-11-07 (×2): 1 mg via INTRAVENOUS
  Filled 2013-11-06 (×3): qty 1

## 2013-11-06 MED ORDER — FOLIC ACID 1 MG PO TABS: 1.0000 mg | ORAL_TABLET | Freq: Every day | ORAL | Status: DC

## 2013-11-06 MED ORDER — THIAMINE HCL 100 MG/ML IJ SOLN
100.0000 mg | Freq: Every day | INTRAMUSCULAR | Status: DC
  Filled 2013-11-06: qty 1

## 2013-11-06 MED ORDER — ENSURE COMPLETE PO LIQD
237.0000 mL | Freq: Two times a day (BID) | ORAL | Status: DC
  Administered 2013-11-06 – 2013-11-08 (×4): 237 mL via ORAL

## 2013-11-06 MED ORDER — VANCOMYCIN HCL IN DEXTROSE 750-5 MG/150ML-% IV SOLN: 750.0000 mg | Freq: Two times a day (BID) | INTRAVENOUS | Status: DC

## 2013-11-06 MED ORDER — VITAMIN B-1 100 MG PO TABS
100.0000 mg | ORAL_TABLET | Freq: Every day | ORAL | Status: DC
  Filled 2013-11-06: qty 1

## 2013-11-06 MED ORDER — ONDANSETRON HCL 4 MG PO TABS: 4.0000 mg | ORAL_TABLET | Freq: Four times a day (QID) | ORAL | Status: DC | PRN

## 2013-11-06 MED ORDER — SODIUM CHLORIDE 0.9 % IJ SOLN
3.0000 mL | Freq: Two times a day (BID) | INTRAMUSCULAR | Status: DC
  Administered 2013-11-06: 3 mL via INTRAVENOUS

## 2013-11-06 MED ORDER — SODIUM CHLORIDE 0.9 % IV SOLN
1.0000 g | Freq: Four times a day (QID) | INTRAVENOUS | Status: DC
  Administered 2013-11-06: 1 g via INTRAVENOUS
  Filled 2013-11-06 (×2): qty 1000

## 2013-11-06 MED ORDER — FOLIC ACID 1 MG PO TABS
1.0000 mg | ORAL_TABLET | Freq: Every day | ORAL | Status: DC
  Administered 2013-11-06 – 2013-11-09 (×4): 1 mg via ORAL
  Filled 2013-11-06 (×4): qty 1

## 2013-11-06 MED ORDER — OXYCODONE-ACETAMINOPHEN 5-325 MG PO TABS
2.0000 | ORAL_TABLET | Freq: Once | ORAL | Status: AC
  Administered 2013-11-06: 2 via ORAL
  Filled 2013-11-06: qty 2

## 2013-11-06 MED ORDER — SODIUM CHLORIDE 0.9 % IV SOLN
INTRAVENOUS | Status: AC
  Administered 2013-11-06: 04:00:00 via INTRAVENOUS

## 2013-11-06 MED ORDER — ADULT MULTIVITAMIN W/MINERALS CH
1.0000 | ORAL_TABLET | Freq: Every day | ORAL | Status: DC
  Filled 2013-11-06: qty 1

## 2013-11-06 MED ORDER — FOLIC ACID 1 MG PO TABS
1.0000 mg | ORAL_TABLET | Freq: Every day | ORAL | Status: DC
  Filled 2013-11-06: qty 1

## 2013-11-06 MED ORDER — DEXTROSE 5 % IV SOLN
650.0000 mg | Freq: Three times a day (TID) | INTRAVENOUS | Status: DC
  Administered 2013-11-06 (×2): 650 mg via INTRAVENOUS
  Filled 2013-11-06 (×3): qty 13

## 2013-11-06 MED ORDER — LORAZEPAM 1 MG PO TABS
1.0000 mg | ORAL_TABLET | Freq: Four times a day (QID) | ORAL | Status: AC | PRN
  Administered 2013-11-07 – 2013-11-08 (×4): 1 mg via ORAL
  Filled 2013-11-06 (×4): qty 1

## 2013-11-06 MED ORDER — THIAMINE HCL 100 MG/ML IJ SOLN
INTRAVENOUS | Status: DC
  Filled 2013-11-06: qty 1000

## 2013-11-06 MED ORDER — CYCLOBENZAPRINE HCL 10 MG PO TABS
10.0000 mg | ORAL_TABLET | Freq: Three times a day (TID) | ORAL | Status: DC | PRN
  Filled 2013-11-06: qty 1

## 2013-11-06 MED ORDER — LORAZEPAM 2 MG/ML IJ SOLN: 2.0000 mg | INTRAMUSCULAR | Status: DC | PRN

## 2013-11-06 MED ORDER — DEXTROSE 5 % IV SOLN
2.0000 g | Freq: Two times a day (BID) | INTRAVENOUS | Status: DC
  Filled 2013-11-06: qty 2

## 2013-11-06 MED ORDER — HEPARIN SODIUM (PORCINE) 5000 UNIT/ML IJ SOLN
5000.0000 [IU] | Freq: Three times a day (TID) | INTRAMUSCULAR | Status: DC
  Administered 2013-11-06 – 2013-11-09 (×10): 5000 [IU] via SUBCUTANEOUS
  Filled 2013-11-06 (×13): qty 1

## 2013-11-06 MED ORDER — ADULT MULTIVITAMIN W/MINERALS CH: 1.0000 | ORAL_TABLET | Freq: Every day | ORAL | Status: DC

## 2013-11-06 MED ORDER — ADULT MULTIVITAMIN W/MINERALS CH
1.0000 | ORAL_TABLET | Freq: Every day | ORAL | Status: DC
  Administered 2013-11-06 – 2013-11-09 (×4): 1 via ORAL
  Filled 2013-11-06 (×4): qty 1

## 2013-11-06 MED ORDER — VALPROATE SODIUM 500 MG/5ML IV SOLN
500.0000 mg | Freq: Two times a day (BID) | INTRAVENOUS | Status: DC
  Administered 2013-11-06 – 2013-11-08 (×5): 500 mg via INTRAVENOUS
  Filled 2013-11-06 (×7): qty 5

## 2013-11-06 MED ORDER — VITAMIN B-1 100 MG PO TABS
100.0000 mg | ORAL_TABLET | Freq: Every day | ORAL | Status: DC
  Administered 2013-11-06 – 2013-11-09 (×4): 100 mg via ORAL
  Filled 2013-11-06 (×4): qty 1

## 2013-11-06 MED ORDER — VITAMIN B-1 100 MG PO TABS: 100.0000 mg | ORAL_TABLET | Freq: Every day | ORAL | Status: DC

## 2013-11-06 NOTE — ED Notes (Signed)
Pt currently having a LP done. Will collect labs later if pt is willing at that time.

## 2013-11-06 NOTE — Consult Note (Signed)
Neurology Consultation Reason for Consult: Seizure Referring Physician: Aline Brochure, F  CC: Seizures  History is obtained from:patient  HPI: Lindsey [name] is a 58 y.o. female with a history of seizures as well as alcohol abuse who presents with seizures. She has been medication non-compliant as evidenced by her low VPA level, however after arriving to the ER, she was having hallucinations and a very prolonged return to baseline. She was then found to be febrile as well. She was started on empiric abx pending LP.   Given these symptoms an LP was performed which shows nml glucose and protein but cell count is pending.     ROS: A 14 point ROS was performed and is negative except as noted in the HPI.  Past Medical History  Diagnosis Date  . Seizures     Family History: No hx similar  Social History: Drinks heavily.   Exam: Current vital signs: BP 158/72  Pulse 98  Temp(Src) 98.2 F (36.8 C) (Oral)  Resp 21  Ht 5\' 8"  (1.727 m)  Wt 68.04 kg (150 lb)  BMI 22.81 kg/m2  SpO2 100% Vital signs in last 24 hours: Temp:  [97.9 F (36.6 C)-101.2 F (38.4 C)] 98.2 F (36.8 C) (02/10 0145) Pulse Rate:  [89-124] 98 (02/10 0145) Resp:  [16-24] 21 (02/10 0145) BP: (156-184)/(72-102) 158/72 mmHg (02/10 0145) SpO2:  [94 %-100 %] 100 % (02/10 0145) Weight:  [68.04 kg (150 lb)] 68.04 kg (150 lb) (02/09 2339)  General: in bed, NAD CV: RRR Mental Status: Patient is awake, alert, oriented to person, place, month, year, and situation. Immediate and remote memory are intact. She has some difficulty with attention.  No signs of aphasia or neglect Cranial Nerves: II: Visual Fields are full. Pupils are equal, round, and reactive to light.  Discs are difficult to visualize. III,IV, VI: EOMI without ptosis or diploplia.  V: Facial sensation is symmetric to temperature VII: Facial movement is symmetric.  VIII: hearing is intact to voice X: Uvula elevates symmetrically XI: Shoulder shrug is  symmetric. XII: tongue is midline without atrophy or fasciculations.  Motor: Tone is normal. Bulk is normal. 5/5 strength was present in all four extremities.  Sensory: Sensation is symmetric to light touch and temperature in the arms and legs. Deep Tendon Reflexes: 2+ and symmetric in the biceps and patellae.  Plantars: Toes are downgoing bilaterally.  Cerebellar: Tremoulous throughout examination bilaterally.  Gait: Not assessed due to acute nature of evaluation and multiple medical monitors in ICU setting.         I have reviewed labs in epic and the results pertinent to this consultation are: CSF protein, glucose within nml limits  I have reviewed the images obtained:CT head - negative.   Impression: 57 yo F with sz in the setting of medication non-compliance. Her tachycardia, restlessness, fever, and hallucinations(earlier, not presently) are concerning for etoh withdrawal and she has been started on CIWA.   She has been loaded with depakote 20mg /kg and started back on home dose.   Recommendations: 1) Agree with CIWA 2) If no pleocytosis, would d/c treatments for encephalitis.  3) continue depakote 500mg  BID   Roland Rack, MD [facility] [phone]  If 7pm- 7am, please page neurology on call at [phone].

## 2013-11-06 NOTE — Progress Notes (Signed)
EEG completed; results pending.    

## 2013-11-06 NOTE — Procedures (Signed)
ELECTROENCEPHALOGRAM REPORT  Patient: Lindsey [name]       Room #: 7616 EEG No. ID: [id number] Age: 58 y.o.        Sex: female Referring Physician: Wyline Copas Report Date:  11/06/2013        Interpreting Physician: Anthony Sar  History: [name] is an 58 y.o. female 58 year old lady with a history of seizure disorder and alcohol abuse and poor compliance with taking recommended medication for seizures. She was admitted following a recurrent seizure with altered mental status. She was also febrile.  Indications for study:  Rule out seizure activity.  Technique: This is an 18 channel routine scalp EEG performed at the bedside with bipolar and monopolar montages arranged in accordance to the international 10/20 system of electrode placement.   Description: This EEG recording was performed during wakefulness and during sleep. Background activity during wakefulness consisted of low amplitude diffuse symmetrical 1-2 Hz irregular delta activity with superimposed diffuse mixed faster rhythms as well as occasional brief runs of 8 and 9 Hz alpha rhythm record from posterior head regions. Photic stimulation was not performed. Hyperventilation was not performed. Symmetrical vertex waves, sleep sandals and arousal responses were recorded during stage II of sleep. No epileptiform discharges were seen during wakefulness nor during sleep.  Interpretation: This EEG is abnormal with mild generalized nonspecific slowing of cerebral activity demonstrated during wakefulness. This pattern of slowing can be seen with a wide variety of neurologic disorders, including metabolic and toxic, as well as degenerative disorders. No evidence of epileptic activity was seen.  Rush Farmer M.D. [facility] [phone]

## 2013-11-06 NOTE — Progress Notes (Signed)
Nutrition Brief Note  Patient identified on the Malnutrition Screening Tool (MST) Report  Wt Readings from Last 15 Encounters:  11/05/13 150 lb (68.04 kg)    Body mass index is 22.81 kg/(m^2). Patient meets criteria for normal weight based on current BMI.   Current diet order is heart healthy, patient is consuming approximately 100% of meals at this time. Labs and medications reviewed. Pt with hx of seizures and alcohol abuse, admitted with change in mental status and seizures. Drinks 12-24 packs of beer per day. Blood alcohol level not elevated on admission. Met with pt who reports good appetite at home, was eating 3 meals/day and drinking 2 Ensure/day. Denies any changes in weight. Getting folic acid, thiamine, and multivitamin. Eating excellent. Will resume home regimen of 2 Ensures/day.   No further nutrition interventions warranted at this time. If nutrition issues arise, please consult RD.   Mikey College MS, [place], [place] Pager [phone] After Hours Pager

## 2013-11-06 NOTE — Progress Notes (Signed)
Patient has had no recurrence of seizure activity. Valproic acid has been restarted for seizure control. EEG showed mild generalized nonspecific slowing of cerebral activity but was otherwise unremarkable.  No further neurological intervention is indicated. Please fill free to contact us for followup if clinically indicated.  Rush Farmer M.D. [facility] [phone]

## 2013-11-06 NOTE — ED Notes (Signed)
Pt unwilling to let this writer draw labs at this time.

## 2013-11-06 NOTE — Progress Notes (Signed)
H and P from six hours ago reviewed. Agree with assessment and plan. Pt presents with seizures - consideration for med noncompliance vs etoh withdrawal. CSF reviewed. No sig WBC count and pt has been afebrile overnight. Pt is awake and oriented this AM. On further questioning, pt admits to drinking "twelve beers" 3-4 days prior to admit. Neurology recs reviewed. Will d/c abx. Continue CIWA. Cont valproic acid. Will remove droplet precautions given benign appearing CSF study.

## 2013-11-06 NOTE — Plan of Care (Signed)
Pt remains stable. Pt requests inpt detox for ETOH. Will consult Psychiatry.

## 2013-11-06 NOTE — H&P (Signed)
[facility] History and Physical  Patient: Lindsey [name]  IPJ:[id number]  DOB: [birth year]  DOS: the patient was seen and examined on 11/06/2013 PCP: [name], MD  Chief Complaint: Seizure/altered mental status  HPI: Lindsey [name] is a 58 y.o. female with Past medical history of seizures and alcohol abuse. The patient is coming from home The history was taken from ED documentation as the patient was first poor historian due to change in her mental status. She mentions that her doctor told her that she had 2 seizures and as per EMS they were tonic-clonic generalized. She denies any fall,. She mentions she drinks 12-24 packs of beer on a daily basis although she does not remember exactly when was her last drink. She also uses marijuana her last use was somewhat this month. As per the ED documentation she's not taking her seizure medications. It is unclear whether she had prior seizures than this admission after her recent discharge. She generally goes to [facility] for her prior workup and was recently discharged. Although she denies any focal deficit on my exam, headache, chest pain, nausea, vomiting, shortness of breath, dizziness, blurring of her vision, abdominal pain, diarrhea, burning urination.  Review of Systems: as mentioned in the history of present illness.  A Comprehensive review of the other systems is negative.  Past Medical History  Diagnosis Date  . Seizures    History reviewed. No pertinent past surgical history. Social History:  reports that she has never smoked. She does not have any smokeless tobacco history on file. She reports that she drinks alcohol. Her drug history is not on file. Independent for most of her  ADL.  Allergies  Allergen Reactions  . Morphine And Related   . Penicillins Nausea Only    No family history on file.  Prior to Admission medications   Medication Sig Start Date End Date Taking? Authorizing Provider  divalproex  (DEPAKOTE) 500 MG DR tablet Take 500 mg by mouth 2 (two) times daily.    Historical Provider, MD    Physical Exam: Filed Vitals:   11/05/13 1804 11/05/13 2050 11/05/13 2339  BP: 182/91 172/90   Pulse: 89 116   Temp: 97.9 F (36.6 C) 101.2 F (38.4 C)   TempSrc: Oral Rectal   Resp: 16 20   Height:   5\' 8"  (1.727 m)  Weight:   68.04 kg (150 lb)  SpO2: 94% 97%     General: Alert, Awake and Oriented to Time, Place and Person. Appear in moderate distress Eyes: PERRL ENT: Oral Mucosa clear moist. Neck: NO JVD Cardiovascular: S1 and S2 Present, NO Murmur, Peripheral Pulses Present Respiratory: Bilateral Air entry equal and Decreased, Clear to Auscultation,  NO Crackles,NO wheezes Abdomen: Bowel Sound Present, Soft and Non tender Skin: no Rash Extremities: no Pedal edema, no calf tenderness Neurologic: Grossly Unremarkable other than lack of attention.  Labs on Admission:  CBC:  Recent Labs Lab 11/05/13 2139  WBC 9.8  HGB 13.3  HCT 38.9  MCV 91.5  PLT 454*    CMP     Component Value Date/Time   NA 140 11/05/2013 2139   K 3.8 11/05/2013 2139   CL 103 11/05/2013 2139   CO2 22 11/05/2013 2139   GLUCOSE 91 11/05/2013 2139   BUN 10 11/05/2013 2139   CREATININE 0.70 11/05/2013 2139   CALCIUM 9.2 11/05/2013 2139   PROT 7.1 02/25/2012 1820   ALBUMIN 3.5 02/25/2012 1820   AST 34 02/25/2012 1820   ALT 14 02/25/2012  1820   ALKPHOS 101 02/25/2012 1820   BILITOT 0.3 02/25/2012 1820   GFRNONAA >90 11/05/2013 2139   GFRAA >90 11/05/2013 2139    No results found for this basename: LIPASE, AMYLASE,  in the last 168 hours No results found for this basename: AMMONIA,  in the last 168 hours  No results found for this basename: CKTOTAL, CKMB, CKMBINDEX, TROPONINI,  in the last 168 hours BNP (last 3 results) No results found for this basename: PROBNP,  in the last 8760 hours  Radiological Exams on Admission: Ct Head Wo Contrast  11/05/2013   CLINICAL DATA:  Seizure, altered mental status.  EXAM: CT  HEAD WITHOUT CONTRAST  TECHNIQUE: Contiguous axial images were obtained from the base of the skull through the vertex without intravenous contrast.  COMPARISON:  None.  FINDINGS: Bony calvarium appears intact. Small right frontal scalp hematoma is noted. No mass effect or midline shift is noted. Ventricular size is within normal limits. There is no evidence of mass lesion, hemorrhage or acute infarction.  IMPRESSION: Small right frontal scalp hematoma. No acute intracranial abnormality seen.   Electronically Signed   By: Sabino Dick M.D.   On: 11/05/2013 20:35   Dg Chest Portable 1 View  11/05/2013   CLINICAL DATA:  Altered mental status.  Seizures.  EXAM: PORTABLE CHEST - 1 VIEW  COMPARISON:  No comparisons  FINDINGS: Cardiopericardial silhouette within normal limits. Mediastinal contours normal. Trachea midline. No airspace disease or effusion. Monitoring leads project over the chest. Mild basilar atelectasis on the right.  IMPRESSION: No acute cardiopulmonary disease.   Electronically Signed   By: Dereck Ligas M.D.   On: 11/05/2013 21:15    Assessment/Plan Principal Problem:   Seizure Active Problems:   Altered mental status   Alcohol abuse   Alcohol withdrawal   Marijuana abuse   1. Seizure The patient is presenting with change in mental status and possible history of seizure. Her Depakote levels are negative and she is supposed to be on Depakote. Neurology has been consulted. At present she does not have any seizure in the ED since arrival, her CT scan of the head is showing scalp hematoma but no intracranial abnormality. Her laboratory is also not showing any acute abnormality. With this she will be admitted to step down unit. That is high chance that she has alcohol withdrawal and didn't secondary to the same and possibly seizure secondary to the same. She'll be kept n.p.o. aspiration precaution, fall precaution, seizure precautions. She has been given loading dose of Depakote and I  would continue her on Depakote 500 every 12 hours for neuro. Serial neuro checks  2. Alcohol abuse and possible alcohol withdrawal She is hypertensive and tachycardic and agitated and she mentions that she has been drinking significantly in the last month or so and currently her alcohol level is negative. With this that is high, that she has alcohol withdrawal and may be a seizure is also secondary to the same. I will place her on CIWA protocol, banana bag, IV Ativan when necessary.  Consults: Neurology appreciate input  DVT Prophylaxis: subcutaneous Heparin Nutrition: N.p.o.  Code Status: Full  Family Communication: Attempt was made to reach daughter on the given number but the number was out of service. Disposition: Admitted to inpatient in step-down unit.  Author: Berle Mull, MD [facility] Pager: [phone] 11/06/2013, 12:31 AM    If 7PM-7AM, please contact night-coverage [email or web address] Password [email or web address]

## 2013-11-06 NOTE — Progress Notes (Signed)
ANTIBIOTIC CONSULT NOTE - INITIAL  Pharmacy Consult for Vancomycin, ceftriaxone, acyclovir Indication: encephalitis  Allergies  Allergen Reactions  . Morphine And Related   . Penicillins Nausea Only    Patient Measurements: Height: 5\' 8"  (172.7 cm) Weight: 150 lb (68.04 kg) IBW/kg (Calculated) : 63.9 Adjusted Body Weight:   Vital Signs: Temp: 98.1 F (36.7 C) (02/10 0400) Temp src: Oral (02/10 0400) BP: 154/91 mmHg (02/10 0412) Pulse Rate: 95 (02/10 0500) Intake/Output from previous day: 02/09 0701 - 02/10 0700 In: 661.7 [I.V.:261.7; IV Piggyback:400] Out: 820 [Urine:820] Intake/Output from this shift: Total I/O In: 661.7 [I.V.:261.7; IV Piggyback:400] Out: 820 [Urine:820]  Labs:  Recent Labs  11/05/13 2139 11/06/13 0215  WBC 9.8 8.9  HGB 13.3 12.9  PLT 454* 475*  CREATININE 0.70 0.70   Estimated Creatinine Clearance: 79.2 ml/min (by C-G formula based on Cr of 0.7). No results found for this basename: VANCOTROUGH, VANCOPEAK, VANCORANDOM, [place], GENTPEAK, GENTRANDOM, TOBRATROUGH, TOBRAPEAK, TOBRARND, AMIKACINPEAK, AMIKACINTROU, AMIKACIN,  in the last 72 hours   Microbiology: Recent Results (from the past 720 hour(s))  GRAM STAIN     Status: None   Collection Time    11/06/13  1:05 AM      Result Value Range Status   Specimen Description CSF   Final   Special Requests NONE   Final   Gram Stain     Final   Value: NO ORGANISMS SEEN     NO WBC SEEN     Gram Stain Report Called to,Read Back By and Verified With: KALLAM,M/2W @0251  ON 11/06/13 BY KARCZEWSKI,S.     CYTOSPIN PREP   Report Status 11/06/2013 FINAL   Final    Medical History: Past Medical History  Diagnosis Date  . Seizures     Medications:  Anti-infectives   Start     Dose/Rate Route Frequency Ordered Stop   11/06/13 1800  vancomycin (VANCOCIN) IVPB 750 mg/150 ml premix     750 mg 150 mL/hr over 60 Minutes Intravenous Every 12 hours 11/06/13 0517     11/06/13 1200  cefTRIAXone  (ROCEPHIN) 2 g in dextrose 5 % 50 mL IVPB     2 g 100 mL/hr over 30 Minutes Intravenous Every 12 hours 11/06/13 0517     11/06/13 0600  ampicillin (OMNIPEN) 1 g in sodium chloride 0.9 % 50 mL IVPB     1 g 150 mL/hr over 20 Minutes Intravenous 4 times per day 11/06/13 0114     11/06/13 0130  acyclovir (ZOVIRAX) 650 mg in dextrose 5 % 100 mL IVPB     650 mg 113 mL/hr over 60 Minutes Intravenous 3 times per day 11/06/13 0117     11/06/13 0000  vancomycin (VANCOCIN) IVPB 1000 mg/200 mL premix     1,000 mg 200 mL/hr over 60 Minutes Intravenous  Once 11/05/13 2356     11/06/13 0000  cefTRIAXone (ROCEPHIN) 2 g in dextrose 5 % 50 mL IVPB     2 g 100 mL/hr over 30 Minutes Intravenous  Once 11/05/13 2356 11/06/13 0438   11/06/13 0000  ampicillin (OMNIPEN) 2 g in sodium chloride 0.9 % 50 mL IVPB     2 g 150 mL/hr over 20 Minutes Intravenous  Once 11/05/13 2356 11/06/13 0244     Assessment: Patient with encephalitis.  MD ordered vancomycin, ceftriaxone, and acyclovir per pharmacy. First dose of antibiotics already given   Goal of Therapy:   Vancomycin trough level 15-20 mcg/ml Rocephin/acyclovir based on manufacturer dosing recommendations.  Plan:  Measure antibiotic drug levels at steady state Follow up culture results Vancomycin 750mg  iv q12hr, acyclovir 650mg  iv q8hr, ceftriaxone 2gm iv q12hr  Tyler Deis, Atreyu Mak Crowford 11/06/2013,5:18 AM

## 2013-11-07 LAB — URINE CULTURE
COLONY COUNT: NO GROWTH
Culture: NO GROWTH

## 2013-11-07 MED ORDER — PREDNISONE 20 MG PO TABS
40.0000 mg | ORAL_TABLET | Freq: Every day | ORAL | Status: DC
  Administered 2013-11-08 – 2013-11-09 (×2): 40 mg via ORAL
  Filled 2013-11-07 (×3): qty 2

## 2013-11-07 MED ORDER — HYDROXYZINE HCL 25 MG PO TABS
25.0000 mg | ORAL_TABLET | Freq: Four times a day (QID) | ORAL | Status: DC | PRN
  Administered 2013-11-07 – 2013-11-09 (×5): 25 mg via ORAL
  Filled 2013-11-07 (×5): qty 1

## 2013-11-07 MED ORDER — PREDNISONE 50 MG PO TABS
60.0000 mg | ORAL_TABLET | Freq: Once | ORAL | Status: AC
  Administered 2013-11-07: 60 mg via ORAL
  Filled 2013-11-07: qty 1

## 2013-11-07 MED ORDER — FAMOTIDINE IN NACL 20-0.9 MG/50ML-% IV SOLN
20.0000 mg | Freq: Once | INTRAVENOUS | Status: AC
  Administered 2013-11-07: 20 mg via INTRAVENOUS
  Filled 2013-11-07: qty 50

## 2013-11-07 MED ORDER — FAMOTIDINE 20 MG PO TABS
20.0000 mg | ORAL_TABLET | Freq: Every day | ORAL | Status: DC
  Administered 2013-11-08 – 2013-11-09 (×2): 20 mg via ORAL
  Filled 2013-11-07 (×2): qty 1

## 2013-11-07 MED ORDER — DIPHENHYDRAMINE HCL 50 MG/ML IJ SOLN
25.0000 mg | Freq: Once | INTRAMUSCULAR | Status: AC
  Administered 2013-11-07: 25 mg via INTRAVENOUS
  Filled 2013-11-07: qty 1

## 2013-11-07 NOTE — ED Provider Notes (Signed)
Medical screening examination/treatment/procedure(s) were conducted as a shared visit with non-physician practitioner(s) and myself.  I personally evaluated the patient during the encounter.   I interviewed and examined the patient. Lungs are CTAB. Cardiac exam wnl. Abdomen soft. Pt agitated with unusual responses on my exam. Family reporting 2 seizures. Pt has a hx of non-compliance w/ her meds, also etoh abuse, so several reasons for seizures. Pt required ativan 2mg  initially to help her relax so that we could examine her and start an IV. She remained encephalopathic appearing during her stay in the ED and was found to have a fever. Neurology was consulted and recommended LP. She initially refused. Her MS began to clear some and by the time she spoke w/ the hospitalist, he was able to convince her to get the LP. She was consented for the LP by me and I performed the procedure. No complications. Vanc/Rocephin/Ampicillin ordered empirically.     Lumbar Puncture Procedure Note  Pre-operative Diagnosis: Altered mental status with fever  Post-operative Diagnosis: Altered mental status with fever  Indications: Diagnostic  Procedure Details   Consent: Informed consent was obtained. Risks of the procedure were discussed including: infection, bleeding, pain and headache.  The patient was positioned under sterile conditions. Betadine solution and sterile drapes were utilized. A spinal needle was inserted at the L2 - L3 interspace. Clear CSF obtained after 1 attempt.  Spinal fluid was obtained and sent to the laboratory.  Findings 48mL of clear spinal fluid was obtained. Opening pressure not calculated as the procedure was performed with the patient sitting up.   Complications:  None; patient tolerated the procedure well.        Condition: stable  Plan Bed rest and admission. Patient placed in the supine position.       Blanchard Kelch, MD 11/07/13 1056

## 2013-11-07 NOTE — Progress Notes (Signed)
Pt noted to have an elevated BP of 179/61. All other VS stable and pt is asymptomatic. [facility] on call was notified. No new orders received. Will continue to monitor pt.

## 2013-11-07 NOTE — Consult Note (Signed)
[facility] Face-to-Face Psychiatry Consult   Reason for Consult:  Alcohol detox Referring Physician:  Dr Haynes [name] is an 58 y.o. female. Total Time spent with patient: 30 minutes  Assessment: AXIS I:  Alcohol Abuse, ETOH withrawl seizure AXIS II:  Deferred AXIS III:   Past Medical History  Diagnosis Date  . Seizures    AXIS IV:  economic problems, other psychosocial or environmental problems, problems related to social environment and problems with primary support group AXIS V:  51-60 moderate symptoms  Plan:  No evidence of imminent risk to self or others at present.   Patient does not meet criteria for psychiatric inpatient admission. Patient requires long-term rehabilitation program.  Subjective:   Lindsey [name] is a 58 y.o. female patient admitted with seizures and change in her mental status.  HPI:  Patient seen chart reviewed.  Patient is 58 year old African American female who has a long history of alcohol abuse and seizures admitted because of change in her mental status.  Consult was called because patient is requesting treatment for her alcohol use.  Patient endorsed that she relapsed into drinking after many years this past Christmas.  She endorsed increased depression, poor sleep, having breakup with a boyfriend and feeling very lonely and sad.  She also admitted issues with other family members and they have not taking her medication on time.  She see Dr. Brunetta Genera in [place] who has prescribed her Depakote and Percocet.  However she is unclear when she was seen him last time.  Upon admission her Depakote level was low.  Patient had a seizure.  She admitted drinking 12-24 packs of beer on a daily basis.  Patient admitted sometime having visual hallucination, seeing family members and shadows.  She also endorsed some time confusion and difficulty remember things.  She has mild tremors and shakes.  She denied any suicidal thoughts or homicidal thoughts.  Patient is a  poor historian and sometimes she has difficulty organizing her thoughts.  She has a history of detox treatment twice but do not remember the details.  At times she appears confused and takes more time to answer the question.  She admitted history of suicidal attempt in the past when she cut her wrist however at that time she was heavily intoxicated and having to this admission.  Patient remembers it happens when she was living in [place].  Patient still has some tremors and shakes.  She denies any auditory hallucination, suicidal thought, homicidal pot, mania at this time.  She endorses 3 of smoking marijuana but do not go right for details.  Patient has seen by neurology.  She is taking Depakote. HPI Elements:   Location:  Increased drinking, sadness, depression. Quality:  Confusion, tremors and shakes. Severity:  Mild to moderate. Timing:  For past 2 months.  Past Psychiatric History: Past Medical History  Diagnosis Date  . Seizures     reports that she has never smoked. She does not have any smokeless tobacco history on file. She reports that she drinks alcohol. Her drug history is not on file. No family history on file.   Living Arrangements: Alone   Abuse/Neglect [facility]) Physical Abuse: Denies Verbal Abuse: Denies Sexual Abuse: Denies Allergies:   Allergies  Allergen Reactions  . Morphine And Related   . Penicillins Nausea Only    ACT Assessment Complete:  No:    Past Psychiatric History: Patient endorsed history of at least 2 detox treatment in the past.  She's been drinking  alcohol for long time.  She endorsed history of suicidal attempt many years ago when she cut her wrist while intoxicated.  She admitted to the psych hospital in [place] but do not remember the details.  She did not remember taking psychotropic medication from a psychiatrist in recent years.  She endorses to smoking marijuana but denies any intravenous drug use.  Place of Residence:  Lives with her  daughter. Employed/Unemployed:  On disability Education:  High school Family Supports:  Yes Objective: Blood pressure 179/61, pulse 84, temperature 96.8 F (36 C), temperature source Axillary, resp. rate 20, height 5' 8"  (1.727 m), weight 136 lb 3.9 oz (61.8 kg), SpO2 100.00%.Body mass index is 20.72 kg/(m^2). Results for orders placed during the hospital encounter of 11/05/13 (from the past 72 hour(s))  URINALYSIS, ROUTINE W REFLEX MICROSCOPIC     Status: Abnormal   Collection Time    11/05/13  8:51 PM      Result Value Ref Range   Color, Urine YELLOW  YELLOW   APPearance CLEAR  CLEAR   Specific Gravity, Urine 1.018  1.005 - 1.030   pH 5.5  5.0 - 8.0   Glucose, UA NEGATIVE  NEGATIVE mg/dL   Hgb urine dipstick SMALL (*) NEGATIVE   Bilirubin Urine NEGATIVE  NEGATIVE   Ketones, ur 15 (*) NEGATIVE mg/dL   Protein, ur 100 (*) NEGATIVE mg/dL   Urobilinogen, UA 0.2  0.0 - 1.0 mg/dL   Nitrite NEGATIVE  NEGATIVE   Leukocytes, UA NEGATIVE  NEGATIVE  URINE MICROSCOPIC-ADD ON     Status: None   Collection Time    11/05/13  8:51 PM      Result Value Ref Range   Squamous Epithelial / LPF RARE  RARE   WBC, UA 0-2  <3 WBC/hpf   RBC / HPF 3-6  <3 RBC/hpf   Bacteria, UA RARE  RARE  URINE CULTURE     Status: None   Collection Time    11/05/13  8:51 PM      Result Value Ref Range   Specimen Description URINE, CATHETERIZED     Special Requests NONE     Culture  Setup Time       Value: 11/06/2013 04:59     Performed at [place]       Value: NO GROWTH     Performed at [facility]   Culture       Value: NO GROWTH     Performed at [facility]   Report Status 11/07/2013 FINAL    URINE RAPID DRUG SCREEN (HOSP PERFORMED)     Status: Abnormal   Collection Time    11/05/13  8:52 PM      Result Value Ref Range   Opiates NONE DETECTED  NONE DETECTED   Cocaine NONE DETECTED  NONE DETECTED   Benzodiazepines NONE DETECTED  NONE DETECTED   Amphetamines  NONE DETECTED  NONE DETECTED   Tetrahydrocannabinol POSITIVE (*) NONE DETECTED   Barbiturates NONE DETECTED  NONE DETECTED   Comment:            DRUG SCREEN FOR MEDICAL PURPOSES     ONLY.  IF CONFIRMATION IS NEEDED     FOR ANY PURPOSE, NOTIFY LAB     WITHIN 5 DAYS.                LOWEST DETECTABLE LIMITS     FOR URINE DRUG SCREEN     Drug  Class       Cutoff (ng/mL)     Amphetamine      1000     Barbiturate      200     Benzodiazepine   341     Tricyclics       962     Opiates          300     Cocaine          300     THC              50  ETHANOL     Status: None   Collection Time    11/05/13  9:39 PM      Result Value Ref Range   Alcohol, Ethyl (B) <11  0 - 11 mg/dL   Comment:            LOWEST DETECTABLE LIMIT FOR     SERUM ALCOHOL IS 11 mg/dL     FOR MEDICAL PURPOSES ONLY  BASIC METABOLIC PANEL     Status: None   Collection Time    11/05/13  9:39 PM      Result Value Ref Range   Sodium 140  137 - 147 mEq/L   Potassium 3.8  3.7 - 5.3 mEq/L   Chloride 103  96 - 112 mEq/L   CO2 22  19 - 32 mEq/L   Glucose, Bld 91  70 - 99 mg/dL   BUN 10  6 - 23 mg/dL   Creatinine, Ser 0.70  0.50 - 1.10 mg/dL   Calcium 9.2  8.4 - 10.5 mg/dL   GFR calc non Af Amer >90  >90 mL/min   GFR calc Af Amer >90  >90 mL/min   Comment: (NOTE)     The eGFR has been calculated using the CKD EPI equation.     This calculation has not been validated in all clinical situations.     eGFR's persistently <90 mL/min signify possible Chronic Kidney     Disease.  CBC     Status: Abnormal   Collection Time    11/05/13  9:39 PM      Result Value Ref Range   WBC 9.8  4.0 - 10.5 K/uL   RBC 4.25  3.87 - 5.11 MIL/uL   Hemoglobin 13.3  12.0 - 15.0 g/dL   HCT 38.9  36.0 - 46.0 %   MCV 91.5  78.0 - 100.0 fL   MCH 31.3  26.0 - 34.0 pg   MCHC 34.2  30.0 - 36.0 g/dL   RDW 13.9  11.5 - 15.5 %   Platelets 454 (*) 150 - 400 K/uL  VALPROIC ACID LEVEL     Status: Abnormal   Collection Time    11/05/13  9:39 PM       Result Value Ref Range   Valproic Acid Lvl <10.0 (*) 50.0 - 100.0 ug/mL   Comment: Performed at [facility]  CSF CELL COUNT WITH DIFFERENTIAL     Status: None   Collection Time    11/06/13  1:05 AM      Result Value Ref Range   Tube # 3     Color, CSF COLORLESS  COLORLESS   Appearance, CSF CLEAR  CLEAR   Supernatant NOT INDICATED     RBC Count, CSF 0  0 /cu mm   WBC, CSF 1  0 - 5 /cu mm   Other Cells, CSF TOO FEW TO COUNT, SMEAR AVAILABLE FOR  REVIEW     Comment: RARE NEUTROPHIL, FEW LYMPHOCYTES AND RARE MONOCYTE SEEN ON CYTOSPIN SMEAR  PROTEIN, CSF     Status: None   Collection Time    11/06/13  1:05 AM      Result Value Ref Range   Total  Protein, CSF 21  15 - 45 mg/dL  GLUCOSE, CSF     Status: None   Collection Time    11/06/13  1:05 AM      Result Value Ref Range   Glucose, CSF 69  43 - 76 mg/dL  CSF CULTURE     Status: None   Collection Time    11/06/13  1:05 AM      Result Value Ref Range   Specimen Description CSF     Special Requests NONE     Gram Stain       Value: CYTOSPIN NO WBC SEEN     NO ORGANISMS SEEN     Performed at [facility] Gram Stain Report Called to,Read Back By and Verified With: Gram Stain Report Called to,Read Back By and Verified With: [facility] M/2W AT [place] ON 11/06/13 BY KARCZEWSKI S     Performed at [facility]   Culture PENDING     Report Status PENDING    GRAM STAIN     Status: None   Collection Time    11/06/13  1:05 AM      Result Value Ref Range   Specimen Description CSF     Special Requests NONE     Gram Stain       Value: NO ORGANISMS SEEN     NO WBC SEEN     Gram Stain Report Called to,Read Back By and Verified With: KALLAM,M/2W @0251  ON 11/06/13 BY KARCZEWSKI,S.     CYTOSPIN PREP   Report Status 11/06/2013 FINAL    CULTURE, BLOOD (ROUTINE X 2)     Status: None   Collection Time    11/06/13  2:14 AM      Result Value Ref Range   Specimen Description BLOOD LEFT ANTECUBITAL     Special Requests BOTTLES  DRAWN AEROBIC ONLY 1CC     Culture  Setup Time       Value: 11/06/2013 08:33     Performed at [facility]   Culture       Value:        BLOOD CULTURE RECEIVED NO GROWTH TO DATE CULTURE WILL BE HELD FOR 5 DAYS BEFORE ISSUING A FINAL NEGATIVE REPORT     Performed at [facility]   Report Status PENDING    COMPREHENSIVE METABOLIC PANEL     Status: None   Collection Time    11/06/13  2:15 AM      Result Value Ref Range   Sodium 139  137 - 147 mEq/L   Potassium 4.0  3.7 - 5.3 mEq/L   Chloride 102  96 - 112 mEq/L   CO2 23  19 - 32 mEq/L   Glucose, Bld 90  70 - 99 mg/dL   BUN 9  6 - 23 mg/dL   Creatinine, Ser 0.70  0.50 - 1.10 mg/dL   Calcium 8.8  8.4 - 10.5 mg/dL   Total Protein 6.9  6.0 - 8.3 g/dL   Albumin 3.6  3.5 - 5.2 g/dL   AST 22  0 - 37 U/L   Comment: SLIGHT HEMOLYSIS     HEMOLYSIS AT THIS LEVEL MAY AFFECT RESULT  ALT 11  0 - 35 U/L   Alkaline Phosphatase 93  39 - 117 U/L   Total Bilirubin 1.1  0.3 - 1.2 mg/dL   GFR calc non Af Amer >90  >90 mL/min   GFR calc Af Amer >90  >90 mL/min   Comment: (NOTE)     The eGFR has been calculated using the CKD EPI equation.     This calculation has not been validated in all clinical situations.     eGFR's persistently <90 mL/min signify possible Chronic Kidney     Disease.  CBC     Status: Abnormal   Collection Time    11/06/13  2:15 AM      Result Value Ref Range   WBC 8.9  4.0 - 10.5 K/uL   RBC 4.16  3.87 - 5.11 MIL/uL   Hemoglobin 12.9  12.0 - 15.0 g/dL   HCT 37.7  36.0 - 46.0 %   MCV 90.6  78.0 - 100.0 fL   MCH 31.0  26.0 - 34.0 pg   MCHC 34.2  30.0 - 36.0 g/dL   RDW 13.7  11.5 - 15.5 %   Platelets 475 (*) 150 - 400 K/uL  PROTIME-INR     Status: None   Collection Time    11/06/13  2:15 AM      Result Value Ref Range   Prothrombin Time 13.4  11.6 - 15.2 seconds   INR 1.04  0.00 - 1.49  STREP PNEUMONIAE URINARY ANTIGEN     Status: None   Collection Time    11/06/13  3:10 AM      Result Value Ref Range    Strep Pneumo Urinary Antigen NEGATIVE  NEGATIVE   Comment: PERFORMED AT [facility]                Infection due to S. pneumoniae     cannot be absolutely ruled out     since the antigen present     may be below the detection limit     of the test.     Performed at [place], URINE     Status: None   Collection Time    11/06/13  3:10 AM      Result Value Ref Range   Specimen Description URINE, RANDOM     Special Requests NONE     Legionella Antigen, Urine       Value: Negative for Legionella pneumophilia serogroup 1     Performed at [facility]   Report Status 11/06/2013 FINAL    MRSA PCR SCREENING     Status: None   Collection Time    11/06/13  4:58 AM      Result Value Ref Range   MRSA by PCR NEGATIVE  NEGATIVE   Comment:            The GeneXpert MRSA Assay (FDA     approved for NASAL specimens     only), is one component of a     comprehensive MRSA colonization     surveillance program. It is not     intended to diagnose MRSA     infection nor to guide or     monitor treatment for     MRSA infections.  CULTURE, BLOOD (ROUTINE X 2)     Status: None   Collection Time    11/06/13  5:00 AM      Result Value Ref Range   Specimen  Description BLOOD LEFT HAND     Special Requests BOTTLES DRAWN AEROBIC AND ANAEROBIC 6CC     Culture  Setup Time       Value: 11/06/2013 10:21     Performed at [facility]   Culture       Value:        BLOOD CULTURE RECEIVED NO GROWTH TO DATE CULTURE WILL BE HELD FOR 5 DAYS BEFORE ISSUING A FINAL NEGATIVE REPORT     Performed at [facility]   Report Status PENDING     Labs are reviewed.  Current Facility-Administered Medications  Medication Dose Route Frequency Provider Last Rate Last Dose  . cyclobenzaprine (FLEXERIL) tablet 10 mg  10 mg Oral TID PRN Jeryl Columbia, NP      . Derrill Memo ON 11/08/2013] famotidine (PEPCID) tablet 20 mg  20 mg Oral Daily Jeryl Columbia, NP       . feeding supplement (ENSURE COMPLETE) (ENSURE COMPLETE) liquid 237 mL  237 mL Oral BID BM Christie Beckers, RD   237 mL at 11/07/13 0913  . folic acid (FOLVITE) tablet 1 mg  1 mg Oral Daily Berle Mull, MD   1 mg at 11/07/13 0913  . heparin injection 5,000 Units  5,000 Units Subcutaneous 3 times per day Berle Mull, MD   5,000 Units at 11/07/13 0508  . hydrOXYzine (ATARAX/VISTARIL) tablet 25 mg  25 mg Oral Q6H PRN Jeryl Columbia, NP   25 mg at 11/07/13 0508  . LORazepam (ATIVAN) tablet 1 mg  1 mg Oral Q6H PRN Jeryl Columbia, NP       Or  . LORazepam (ATIVAN) injection 1 mg  1 mg Intravenous Q6H PRN Rhetta Mura Schorr, NP   1 mg at 11/07/13 0100  . LORazepam (ATIVAN) injection 2-3 mg  2-3 mg Intravenous Q1H PRN Berle Mull, MD      . multivitamin with minerals tablet 1 tablet  1 tablet Oral Daily Berle Mull, MD   1 tablet at 11/07/13 0913  . ondansetron (ZOFRAN) tablet 4 mg  4 mg Oral Q6H PRN Berle Mull, MD       Or  . ondansetron (ZOFRAN) injection 4 mg  4 mg Intravenous Q6H PRN Berle Mull, MD      . Derrill Memo ON 11/08/2013] predniSONE (DELTASONE) tablet 40 mg  40 mg Oral Q breakfast Rhetta Mura Schorr, NP      . sodium chloride 0.9 % injection 3 mL  3 mL Intravenous Q12H Berle Mull, MD   3 mL at 11/06/13 1024  . thiamine (VITAMIN B-1) tablet 100 mg  100 mg Oral Daily Berle Mull, MD   100 mg at 11/07/13 0913  . valproate (DEPACON) 500 mg in dextrose 5 % 50 mL IVPB  500 mg Intravenous Q12H Berle Mull, MD   500 mg at 11/06/13 2159    Psychiatric Specialty Exam:     Blood pressure 179/61, pulse 84, temperature 96.8 F (36 C), temperature source Axillary, resp. rate 20, height 5' 8"  (1.727 m), weight 136 lb 3.9 oz (61.8 kg), SpO2 100.00%.Body mass index is 20.72 kg/(m^2).  General Appearance: Disheveled and poor dental hygiene  Eye Contact::  Minimal  Speech:  Slow  Volume:  Normal  Mood:  Anxious and Dysphoric  Affect:  Constricted  Thought Process:  Circumstantial  and Loose  Orientation:  Other:  Fair  Thought Content:  Rumination and Some time confusion  Suicidal Thoughts:  No  Homicidal Thoughts:  No  Memory:  Difficult to remember old things.  Judgement:  Fair  Insight:  Fair  Psychomotor Activity:  Tremor  Concentration:  Poor  Recall:  Poor  Fund of Knowledge:Fair  Language: Fair  Akathisia:  No  Handed:  Right  AIMS (if indicated):     Assets:  Communication Skills Desire for Improvement Housing Social Support  Sleep:      Musculoskeletal: Strength & Muscle Tone: decreased Gait & Station: Patient lying on the bed Patient leans: N/A  Treatment Plan Summary: Medication management Patient is on Ativan and Depakote.  Patient requires long-term treatment upon finishing detox treatment.  She is interested for 30 days to 90 days program.  Consider social worker consult for appropriate disposition.  Patient does not require inpatient psychiatric treatment at this time.  Please call [phone] if you have any further question.  Eduin Friedel T. 11/07/2013 10:11 AM

## 2013-11-07 NOTE — Progress Notes (Signed)
Event: Notified by RN pt c/o itching to her back. Order placed for Benadryl 25 mg IV x 1. RN f/u report states pt got no relief of itching and now w/ raised red rash to entire back and hip area which is new. Pt also reports her throat feels "tight", but tells RN that this happens at home sometimes. NP to bedside. Subjective: Pt is a very vague historian which makes getting specific information challenging. However pt states this itching started "downstairs" before she was admitted. But then states she has had no itching until tonight. Pt relates that she got Percocet last night but she has taken Percocet many times and did not have itching. She admits that her throat felt tight but admits this "happens sometimes at home but I don't know why". She denies being SOB or facial swelling. Objective: Lindsey [name] is a 58 y/o female with a history of seizure disorder, alcohol abuse and poor compliance with taking recommended medication for seizures. She was admitted 11/05/2013 following a recurrent seizure with altered mental status. She has remained on the CIWA/Alcohol withdrawal protocol and has remained stable. At bedside pt noted sitting up in bed eating crackers in NAD. VSS and pt remains afebrile. No facial swelling. BBS w/ bil rhonchi which RN reports is not new. No wheezes or crackles. Pt is not SOB and 02 sats are 100% on r/a. Entire back and bil posterior hip area noted very erythematous w/ slightly raised papular rash which is irregularly shaped in both round and linear shaped distributions. No rash or itching noted anywhere else. No associated swelling. Assessment/Plan: 1. Pruritic rash c/w urticaria:  Etiology unclear. No respiratory distress. Given pt's subjective report of throat feeling tight will treat w/ PO Prednisone now then 40 gm PO qd x 5, IV Pepcid now then PO Pepcid qd x 5 and PRN hydroxyzine per pt report that Benadryl did not help. Will continue to monitor closely on telemetry.  Jeryl Columbia, NP-C [facility] Pager [phone]

## 2013-11-07 NOTE — Progress Notes (Signed)
Clinical Social Work Department CLINICAL SOCIAL WORK PSYCHIATRY SERVICE LINE ASSESSMENT 11/07/2013  Patient:  Lindsey [name]  Account:  [id number]  [place] Date:  11/05/2013  Clinical Social Worker:  Sindy Messing, LCSW  Date/Time:  11/07/2013 10:55 AM Referred by:  Physician  Date referred:  11/07/2013 Reason for Referral  Substance Abuse   Presenting Symptoms/Problems (In the person's/family's own words):   Psych consulted due to substance use.   Abuse/Neglect/Trauma History (check all that apply)  Witness to trauma   Abuse/Neglect/Trauma Comments:   Patient reports that mother was murdered when she was 49 years old.   Psychiatric History (check all that apply)  Outpatient treatment   Psychiatric medications:  None   Current Mental Health Hospitalizations/Previous Mental Health History:   Patient reports that she was diagnosed with COPD by MD. After further discussion, this writer believes patient actually means PTSD. Patient reports flashbacks and nightmares after mother's death. Patient denies any current symptoms of PTSD, depression or anxiety. Patient reports she feels overwhelmed at times but does not believe she is depressed.   Current provider:   Dr. Janit Bern and Date:   [place], [place]   Current Medications:   Scheduled Meds:      . [START ON 11/08/2013] famotidine  20 mg Oral Daily  . feeding supplement (ENSURE COMPLETE)  237 mL Oral BID BM  . folic acid  1 mg Oral Daily  . heparin  5,000 Units Subcutaneous 3 times per day  . multivitamin with minerals  1 tablet Oral Daily  . [START ON 11/08/2013] predniSONE  40 mg Oral Q breakfast  . sodium chloride  3 mL Intravenous Q12H  . thiamine  100 mg Oral Daily  . valproate sodium  500 mg Intravenous Q12H        Continuous Infusions:      PRN Meds:.cyclobenzaprine, hydrOXYzine, LORazepam, LORazepam, LORazepam, ondansetron (ZOFRAN) IV, ondansetron       Previous Impatient Admission/Date/Reason:   Patient went to  inpatient rehab for SA in [place] over 20 years ago to an agency called [facility].   Emotional Health / Current Symptoms    Suicide/Self Harm  None reported   Suicide attempt in the past:   Patient reports she cut her wrists when she was intoxicated in the past but was not trying to harm herself. Patient denies any SI or HI currently.   Other harmful behavior:   None reported   Psychotic/Dissociative Symptoms  Visual Hallucinations   Other Psychotic/Dissociative Symptoms:   Patient admits to some hallucinations when drinking alcohol but reports when she is sober she does not experience any hallucinations.    Attention/Behavioral Symptoms  Within Normal Limits   Other Attention / Behavioral Symptoms:   Patient engaged during assessment.    Cognitive Impairment  Within Normal Limits   Other Cognitive Impairment:   Patient alert and oriented.    Mood and Adjustment  Flat    Stress, Anxiety, Trauma, Any Recent Loss/Stressor  Flashbacks (Intrusive recollections of past traumatic events)   Anxiety (frequency):   N/A   Phobia (specify):   N/A   Compulsive behavior (specify):   N/A   Obsessive behavior (specify):   N/A   Other:   Patient reports flashbacks and nightmares from mother's death.   Substance Abuse/Use  Current substance use   SBIRT completed (please refer for detailed history):  Y  Self-reported substance use:   Patient is guarded when discussing substance use. Patient reports she smokes marijuana but that  her MD gave her permission due to bone pain. Patient does not want to speak about marijuana use in detail and reports she will also smoke THC. Patient reports that she was sober from alcohol for over 30 years but started drinking again at Christmas time. At one point patient reports that she was drinking on a daily basis but then later reported that she had not drank any alcohol since [place] Years Eve. Patient changes her story multiple times but  continues to deny any recent alcohol use. Per chart review, patient admitted to MD that she was drinking 12 beers 3-4 times a week. Patient reports she does not want to discuss alcohol consumption in further detail but does feel that treatment is warranted.   Urinary Drug Screen Completed:  Y Alcohol level:   <11    Environmental/Housing/Living Arrangement  Stable housing   Who is in the home:   Friend   Emergency contact:  [name]-friend   Financial  Medicaid   Patient's Strengths and Goals (patient's own words):   Patient reports that she has 2 dtrs that live nearby and are supportive.   Clinical Social Worker's Interpretive Summary:   CSW received referral in order to complete psychosocial assessment. CSW reviewed chart and met with patient at bedside. CSW introduced myself and explained role.    Patient reports that she came to the hospital because she had a seizure. Patient reports she has had seizures in the past but they have always been related to alcohol use. Patient reports she is from [place] but moved to [place] in order to live with her friend in 24. Patient reports that things have been going well in [place] but around Christmas time she felt she was being taken advantage of and lonely so she decided to have one drink. Patient reports a history of alcohol use but does not want to give further information. Patient reports she decided to go to treatment and has been sober for over 30 years. Patient is disappointed that she relapsed but reports she understands that everyone makes mistakes and that she can get sober again.    Patient spoke about childhood and traumatic event of mother being murdered when she was 47 years old. Patient and brother lived with their grandmother after event and patient attended therapy. Patient reports that she was diagnosed with COPD (this Probation officer believes she means PTSD) after event and that therapy was beneficial but she does not believe she has "ever moved  passed it." Patient was tearful and reported she still misses her mother and wished she hadn't passed away.    Patient reports that she drank between Christmas and [place] but denies to this Probation officer that she has consumed any alcohol since. Patient reports she understands she needs treatment but does not believe that SA is related to any emotional connections. Patient reports that she would be interested in individual therapy so that she could talk about her mother and discuss decreasing alcohol consumption. Per chart review, patient admits to consuming large amounts of alcohol more recently. CSW and patient completed SBIRT and discussed residential treatment. Patient reports at that at this time she wants to continue to live at home so that she can use her disability money and does not want any others spending her money. CSW and patient spoke about the benefits of residential programs and her success in the past.    CSW provided SA resources for [employer], outpatient, and residential treatment options. Patient agreeable to review  list and to consider options. Psych MD is recommending residential treatment if patient is agreeable. CSW provided patient with CSW contact information and will follow up in order to assist with patient's decision re: treatment options.    Patient alert and oriented during assessment but sometimes had delayed responses. Patient appears to minimize alcohol use when talking with CSW but is agreeable to treatment. Patient reports good informal supports but is lacking formal supports since move from [place]. Patient engaged in assessment and thanked CSW for visit. Patient agreeable for CSW to follow up during hospital stay.   Disposition:  Recommend Psych CSW continuing to support while in hospital   [place], [place] [phone]

## 2013-11-07 NOTE — Progress Notes (Signed)
PROGRESS NOTE  Lindsey [name] YNW:[id number] DOB: [birth year] DOA: 11/05/2013 PCP: [name], MD  Assessment/Plan: Seizure - neurology evaluated the patient, appreciate input. Patient without any further seizures. ETOH abuse - CIWA ETOH withdrawal - CIWA Acute encephalopathy - resolved, briefly on antibiotics discontinued after negative LP.  Diet: Heart healthy Fluids: None DVT Prophylaxis: heparin  Code Status: Full Family Communication: none  Disposition Plan: inpatient  Consultants:  Psychiatry  Neurology  Procedures:  none   Antibiotics Vanc/Ceftriaxone/Amp 2/10 >>2/10  HPI/Subjective: Mild itching in the back  Objective: Filed Vitals:   11/06/13 1919 11/07/13 0019 11/07/13 0513 11/07/13 1416  BP: 155/80 157/86 179/61 154/72  Pulse: 103 88 84 91  Temp: 98 F (36.7 C)  96.8 F (36 C) 97.7 F (36.5 C)  TempSrc: Oral  Axillary Oral  Resp: 22  20 18   Height: 5\' 8"  (1.727 m)     Weight: 61.8 kg (136 lb 3.9 oz)     SpO2: 100%  100% 99%    Intake/Output Summary (Last 24 hours) at 11/07/13 1800 Last data filed at 11/07/13 1500  Gross per 24 hour  Intake    750 ml  Output      0 ml  Net    750 ml   Filed Weights   11/05/13 2339 11/06/13 1919  Weight: 68.04 kg (150 lb) 61.8 kg (136 lb 3.9 oz)   Exam:  General:  NAD  Cardiovascular: regular rate and rhythm, without MRG  Respiratory: good air movement, clear to auscultation throughout, no wheezing, ronchi or rales  Abdomen: soft, not tender to palpation, positive bowel sounds  MSK: no peripheral edema  Neuro: CN 2-12 grossly intact, MS 5/5 in all 4  Data Reviewed: Basic Metabolic Panel:  Recent Labs Lab 11/05/13 2139 11/06/13 0215  NA 140 139  K 3.8 4.0  CL 103 102  CO2 22 23  GLUCOSE 91 90  BUN 10 9  CREATININE 0.70 0.70  CALCIUM 9.2 8.8   Liver Function Tests:  Recent Labs Lab 11/06/13 0215  AST 22  ALT 11  ALKPHOS 93  BILITOT 1.1  PROT 6.9  ALBUMIN 3.6    CBC:  Recent Labs Lab 11/05/13 2139 11/06/13 0215  WBC 9.8 8.9  HGB 13.3 12.9  HCT 38.9 37.7  MCV 91.5 90.6  PLT 454* 475*   Cardiac Enzymes: No results found for this basename: CKTOTAL, CKMB, CKMBINDEX, TROPONINI,  in the last 168 hours BNP (last 3 results) No results found for this basename: PROBNP,  in the last 8760 hours CBG: No results found for this basename: GLUCAP,  in the last 168 hours  Recent Results (from the past 240 hour(s))  URINE CULTURE     Status: None   Collection Time    11/05/13  8:51 PM      Result Value Ref Range Status   Specimen Description URINE, CATHETERIZED   Final   Special Requests NONE   Final   Culture  Setup Time     Final   Value: 11/06/2013 04:59     Performed at [place]     Final   Value: NO GROWTH     Performed at [facility]   Culture     Final   Value: NO GROWTH     Performed at [facility]   Report Status 11/07/2013 FINAL   Final  CSF CULTURE     Status: None   Collection Time  11/06/13  1:05 AM      Result Value Ref Range Status   Specimen Description CSF   Final   Special Requests NONE   Final   Gram Stain     Final   Value: CYTOSPIN NO WBC SEEN     NO ORGANISMS SEEN     Performed at [facility] Gram Stain Report Called to,Read Back By and Verified With: Gram Stain Report Called to,Read Back By and Verified With: [facility] M/2W AT [place] ON 11/06/13 BY KARCZEWSKI S     Performed at [facility]   Culture     Final   Value: NO GROWTH 1 DAY     Performed at [facility]   Report Status PENDING   Incomplete  GRAM STAIN     Status: None   Collection Time    11/06/13  1:05 AM      Result Value Ref Range Status   Specimen Description CSF   Final   Special Requests NONE   Final   Gram Stain     Final   Value: NO ORGANISMS SEEN     NO WBC SEEN     Gram Stain Report Called to,Read Back By and Verified With: KALLAM,M/2W @0251  ON 11/06/13 BY KARCZEWSKI,S.      CYTOSPIN PREP   Report Status 11/06/2013 FINAL   Final  CULTURE, BLOOD (ROUTINE X 2)     Status: None   Collection Time    11/06/13  2:14 AM      Result Value Ref Range Status   Specimen Description BLOOD LEFT ANTECUBITAL   Final   Special Requests BOTTLES DRAWN AEROBIC ONLY 1CC   Final   Culture  Setup Time     Final   Value: 11/06/2013 08:33     Performed at [facility]   Culture     Final   Value:        BLOOD CULTURE RECEIVED NO GROWTH TO DATE CULTURE WILL BE HELD FOR 5 DAYS BEFORE ISSUING A FINAL NEGATIVE REPORT     Performed at [facility]   Report Status PENDING   Incomplete  MRSA PCR SCREENING     Status: None   Collection Time    11/06/13  4:58 AM      Result Value Ref Range Status   MRSA by PCR NEGATIVE  NEGATIVE Final   Comment:            The GeneXpert MRSA Assay (FDA     approved for NASAL specimens     only), is one component of a     comprehensive MRSA colonization     surveillance program. It is not     intended to diagnose MRSA     infection nor to guide or     monitor treatment for     MRSA infections.  CULTURE, BLOOD (ROUTINE X 2)     Status: None   Collection Time    11/06/13  5:00 AM      Result Value Ref Range Status   Specimen Description BLOOD LEFT HAND   Final   Special Requests BOTTLES DRAWN AEROBIC AND ANAEROBIC [facility]   Final   Culture  Setup Time     Final   Value: 11/06/2013 10:21     Performed at [facility]   Culture     Final   Value:        BLOOD CULTURE RECEIVED NO GROWTH TO DATE  CULTURE WILL BE HELD FOR 5 DAYS BEFORE ISSUING A FINAL NEGATIVE REPORT     Performed at [facility]   Report Status PENDING   Incomplete     Studies: Ct Head Wo Contrast  11/05/2013   CLINICAL DATA:  Seizure, altered mental status.  EXAM: CT HEAD WITHOUT CONTRAST  TECHNIQUE: Contiguous axial images were obtained from the base of the skull through the vertex without intravenous contrast.  COMPARISON:  None.  FINDINGS: Bony  calvarium appears intact. Small right frontal scalp hematoma is noted. No mass effect or midline shift is noted. Ventricular size is within normal limits. There is no evidence of mass lesion, hemorrhage or acute infarction.  IMPRESSION: Small right frontal scalp hematoma. No acute intracranial abnormality seen.   Electronically Signed   By: Sabino Dick M.D.   On: 11/05/2013 20:35   Dg Chest Portable 1 View  11/05/2013   CLINICAL DATA:  Altered mental status.  Seizures.  EXAM: PORTABLE CHEST - 1 VIEW  COMPARISON:  No comparisons  FINDINGS: Cardiopericardial silhouette within normal limits. Mediastinal contours normal. Trachea midline. No airspace disease or effusion. Monitoring leads project over the chest. Mild basilar atelectasis on the right.  IMPRESSION: No acute cardiopulmonary disease.   Electronically Signed   By: Dereck Ligas M.D.   On: 11/05/2013 21:15    Scheduled Meds: . [START ON 11/08/2013] famotidine  20 mg Oral Daily  . feeding supplement (ENSURE COMPLETE)  237 mL Oral BID BM  . folic acid  1 mg Oral Daily  . heparin  5,000 Units Subcutaneous 3 times per day  . multivitamin with minerals  1 tablet Oral Daily  . [START ON 11/08/2013] predniSONE  40 mg Oral Q breakfast  . sodium chloride  3 mL Intravenous Q12H  . thiamine  100 mg Oral Daily  . valproate sodium  500 mg Intravenous Q12H   Continuous Infusions:   Principal Problem:   Seizure Active Problems:   Altered mental status   Alcohol abuse   Alcohol withdrawal   Marijuana abuse   Time spent: [place], [place] [facility] Pager [phone]. If 7 PM - 7 AM, please contact night-coverage at [email or web address], password [facility] 11/07/2013, 6:00 PM  LOS: 2 days

## 2013-11-08 MED ORDER — DIVALPROEX SODIUM 500 MG PO DR TAB
500.0000 mg | DELAYED_RELEASE_TABLET | Freq: Two times a day (BID) | ORAL | Status: DC
  Administered 2013-11-08 – 2013-11-09 (×2): 500 mg via ORAL
  Filled 2013-11-08 (×4): qty 1

## 2013-11-08 MED ORDER — HYDROCERIN EX CREA
TOPICAL_CREAM | Freq: Two times a day (BID) | CUTANEOUS | Status: DC
  Administered 2013-11-08 – 2013-11-09 (×2): via TOPICAL
  Filled 2013-11-08: qty 113

## 2013-11-08 NOTE — Progress Notes (Signed)
Clinical Social Work Progress Note PSYCHIATRY SERVICE LINE 11/08/2013  Patient:  DEBIE ASHLINE  Account:  [id number]  [place] Date:  11/05/2013  Clinical Social Worker:  Sindy Messing, LCSW  Date/Time:  11/08/2013 01:45 PM  Review of Patient  Overall Medical Condition:   Patient reports she is feeling better and is anxious to DC home.   Participation Level:  Active  Participation Quality  Appropriate   Other Participation Quality:   Patient engaged during session.   Affect  Flat   Cognitive  Appropriate   Reaction to Medications/Concerns:   None reported   Modes of Intervention  Support   Summary of Progress/Plan at Discharge   CSW met with patient at bedside. Patient laying in bed watching TV when CSW arrived. Patient reports she vaguely remembers talking with CSW yesterday. CSW reminded patient of how CSW assisted with substance abuse resources and patient remembered conversation.    Patient reports she has reviewed list and started to speak with family about options. Patient reports she knows she needs to remain sober but is not willing to go for residential treatment. Patient reports she has agreed to move in with dtr who lives in [place] for additional support. Patient reports she will follow up on outpatient basis but does not feel she can be aware from a home environment. Patient aware of options and reports she will review agencies before deciding which facility she wants to attend.    Patient engaged in assessment but still appears to minimize alcohol consumption and negative affects. Patient aware of SA options and feels that she can remain sober at home with family.    CSW agreeable to continue to follow and to assist as needed.      [place], [place] [phone]

## 2013-11-08 NOTE — Progress Notes (Signed)
PROGRESS NOTE  Lindsey [name] WJX:[id number] DOB: 02-23-1956 DOA: 11/05/2013 PCP: [name], MD  Assessment/Plan: Seizure - neurology evaluated the patient, appreciate input. Patient without any further seizures. ETOH abuse - CIWA, ETOH withdrawal - CIWA, still requiring IV Ativan today. Anticipate that she'll be safe for discharge tomorrow  Acute encephalopathy - resolved, briefly on antibiotics discontinued after negative LP.  Diet: Heart healthy Fluids: None DVT Prophylaxis: heparin  Code Status: Full Family Communication: none  Disposition Plan: inpatient  Consultants:  Psychiatry  Neurology  Procedures:  none   Antibiotics Vanc/Ceftriaxone/Amp 2/10 >>2/10  HPI/Subjective: Mild itching in the back  Objective: Filed Vitals:   11/07/13 1416 11/07/13 2158 11/08/13 0541 11/08/13 1358  BP: 154/72 145/66 152/81 152/75  Pulse: 91 90 71 92  Temp: 97.7 F (36.5 C) 98.1 F (36.7 C) 98 F (36.7 C) 98 F (36.7 C)  TempSrc: Oral Oral Oral Oral  Resp: 18 18 18 20   Height:      Weight:      SpO2: 99% 99% 100% 99%    Intake/Output Summary (Last 24 hours) at 11/08/13 1519 Last data filed at 11/08/13 1300  Gross per 24 hour  Intake   1095 ml  Output      0 ml  Net   1095 ml   Filed Weights   11/05/13 2339 11/06/13 1919  Weight: 68.04 kg (150 lb) 61.8 kg (136 lb 3.9 oz)   Exam:  General:  NAD  Cardiovascular: regular rate and rhythm, without MRG  Respiratory: good air movement, clear to auscultation throughout, no wheezing, ronchi or rales  Abdomen: soft, not tender to palpation, positive bowel sounds  MSK: no peripheral edema  Neuro: CN 2-12 grossly intact, MS 5/5 in all 4  Data Reviewed: Basic Metabolic Panel:  Recent Labs Lab 11/05/13 2139 11/06/13 0215  NA 140 139  K 3.8 4.0  CL 103 102  CO2 22 23  GLUCOSE 91 90  BUN 10 9  CREATININE 0.70 0.70  CALCIUM 9.2 8.8   Liver Function Tests:  Recent Labs Lab 11/06/13 0215  AST 22   ALT 11  ALKPHOS 93  BILITOT 1.1  PROT 6.9  ALBUMIN 3.6   CBC:  Recent Labs Lab 11/05/13 2139 11/06/13 0215  WBC 9.8 8.9  HGB 13.3 12.9  HCT 38.9 37.7  MCV 91.5 90.6  PLT 454* 475*     Recent Results (from the past 240 hour(s))  URINE CULTURE     Status: None   Collection Time    11/05/13  8:51 PM      Result Value Ref Range Status   Specimen Description URINE, CATHETERIZED   Final   Special Requests NONE   Final   Culture  Setup Time     Final   Value: 11/06/2013 04:59     Performed at [place]     Final   Value: NO GROWTH     Performed at [facility]   Culture     Final   Value: NO GROWTH     Performed at [facility]   Report Status 11/07/2013 FINAL   Final  CSF CULTURE     Status: None   Collection Time    11/06/13  1:05 AM      Result Value Ref Range Status   Specimen Description CSF   Final   Special Requests NONE   Final   Gram Stain     Final  Value: CYTOSPIN NO WBC SEEN     NO ORGANISMS SEEN     Performed at [facility] Gram Stain Report Called to,Read Back By and Verified With: Gram Stain Report Called to,Read Back By and Verified With: [facility] M/2W AT [place] ON 11/06/13 BY KARCZEWSKI S     Performed at [facility]   Culture     Final   Value: NO GROWTH 2 DAYS     Performed at [facility]   Report Status PENDING   Incomplete  GRAM STAIN     Status: None   Collection Time    11/06/13  1:05 AM      Result Value Ref Range Status   Specimen Description CSF   Final   Special Requests NONE   Final   Gram Stain     Final   Value: NO ORGANISMS SEEN     NO WBC SEEN     Gram Stain Report Called to,Read Back By and Verified With: KALLAM,M/2W @0251  ON 11/06/13 BY KARCZEWSKI,S.     CYTOSPIN PREP   Report Status 11/06/2013 FINAL   Final  CULTURE, BLOOD (ROUTINE X 2)     Status: None   Collection Time    11/06/13  2:14 AM      Result Value Ref Range Status   Specimen Description BLOOD  LEFT ANTECUBITAL   Final   Special Requests BOTTLES DRAWN AEROBIC ONLY 1CC   Final   Culture  Setup Time     Final   Value: 11/06/2013 08:33     Performed at [facility]   Culture     Final   Value:        BLOOD CULTURE RECEIVED NO GROWTH TO DATE CULTURE WILL BE HELD FOR 5 DAYS BEFORE ISSUING A FINAL NEGATIVE REPORT     Performed at [facility]   Report Status PENDING   Incomplete  MRSA PCR SCREENING     Status: None   Collection Time    11/06/13  4:58 AM      Result Value Ref Range Status   MRSA by PCR NEGATIVE  NEGATIVE Final   Comment:            The GeneXpert MRSA Assay (FDA     approved for NASAL specimens     only), is one component of a     comprehensive MRSA colonization     surveillance program. It is not     intended to diagnose MRSA     infection nor to guide or     monitor treatment for     MRSA infections.  CULTURE, BLOOD (ROUTINE X 2)     Status: None   Collection Time    11/06/13  5:00 AM      Result Value Ref Range Status   Specimen Description BLOOD LEFT HAND   Final   Special Requests BOTTLES DRAWN AEROBIC AND ANAEROBIC [facility]   Final   Culture  Setup Time     Final   Value: 11/06/2013 10:21     Performed at [facility]   Culture     Final   Value:        BLOOD CULTURE RECEIVED NO GROWTH TO DATE CULTURE WILL BE HELD FOR 5 DAYS BEFORE ISSUING A FINAL NEGATIVE REPORT     Performed at [facility]   Report Status PENDING   Incomplete     Studies: No results found.  Scheduled Meds: .  famotidine  20 mg Oral Daily  . feeding supplement (ENSURE COMPLETE)  237 mL Oral BID BM  . folic acid  1 mg Oral Daily  . heparin  5,000 Units Subcutaneous 3 times per day  . multivitamin with minerals  1 tablet Oral Daily  . predniSONE  40 mg Oral Q breakfast  . sodium chloride  3 mL Intravenous Q12H  . thiamine  100 mg Oral Daily  . valproate sodium  500 mg Intravenous Q12H   Continuous Infusions:   Principal Problem:    Seizure Active Problems:   Altered mental status   Alcohol abuse   Alcohol withdrawal   Marijuana abuse   Time spent: [place], [place] [facility] Pager [phone]. If 7 PM - 7 AM, please contact night-coverage at [email or web address], password [facility] 11/08/2013, 3:19 PM  LOS: 3 days

## 2013-11-08 NOTE — Progress Notes (Signed)
Pt HR noted to become elevated to 140 with activity (getting up to the [facility]). Pt was asymptomatic and HR returned to 80-90's at rest. Lamar Blinks, NP notified. Will continue to monitor.

## 2013-11-09 LAB — CSF CULTURE: CULTURE: NO GROWTH

## 2013-11-09 LAB — CSF CULTURE W GRAM STAIN: Gram Stain: NONE SEEN

## 2013-11-09 MED ORDER — PREDNISONE 20 MG PO TABS: 40.0000 mg | ORAL_TABLET | Freq: Every day | ORAL | Status: DC

## 2013-11-09 MED ORDER — ADULT MULTIVITAMIN W/MINERALS CH: 1.0000 | ORAL_TABLET | Freq: Every day | ORAL | Status: DC

## 2013-11-09 MED ORDER — HYDROXYZINE HCL 25 MG PO TABS: 25.0000 mg | ORAL_TABLET | Freq: Four times a day (QID) | ORAL | Status: DC | PRN

## 2013-11-09 NOTE — Discharge Instructions (Signed)

## 2013-11-09 NOTE — Progress Notes (Signed)
Pt d/c'd and provided with AVS and prescriptions. Taxi service called. Voucher provided by CSW.
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
## 2013-11-09 NOTE — Progress Notes (Signed)
[facility] is providing the following services: Kasandra Knudsen  If patient discharges after hours, please call [phone].   Linward Headland 11/09/2013, 10:25 AM

## 2013-11-09 NOTE — Discharge Summary (Signed)
Physician Discharge Summary  Lindsey [name] YJE:[id number] DOB: 02-May-1956 DOA: 11/05/2013  PCP: [name], MD  Admit date: 11/05/2013 Discharge date: 11/09/2013  Time spent: 35 minutes  Recommendations for Outpatient Follow-up:  1. Follow up with PCP in 1-2 weeks 2. Follow up with AA as outpatient.    Discharge Diagnoses:  Principal Problem:   Seizure Active Problems:   Altered mental status   Alcohol abuse   Alcohol withdrawal   Marijuana abuse  Discharge Condition: Stable  Diet recommendation: Regular  Filed Weights   11/05/13 2339 11/06/13 1919  Weight: 68.04 kg (150 lb) 61.8 kg (136 lb 3.9 oz)   History of present illness:  Lindsey [name] is a 58 y.o. female with Past medical history of seizures and alcohol abuse. The patient is coming from home  The history was taken from ED documentation as the patient was first poor historian due to change in her mental status. She mentions that her doctor told her that she had 2 seizures and as per EMS they were tonic-clonic generalized. She denies any fall,. She mentions she drinks 12-24 packs of beer on a daily basis although she does not remember exactly when was her last drink. She also uses marijuana her last use was somewhat this month. As per the ED documentation she's not taking her seizure medications.  It is unclear whether she had prior seizures than this admission after her recent discharge.  She generally goes to [facility] for her prior workup and was recently discharged.  Although she denies any focal deficit on my exam, headache, chest pain, nausea, vomiting, shortness of breath, dizziness, blurring of her vision, abdominal pain, diarrhea, burning urination.  Hospital Course:  Seizure - this is likely in the setting of medication noncompliance since her Depakote levels were negative, as well as concomitant alcohol abuse. Neurology has been consulted, patient was restarted on her medications, without any further seizure  episodes in the hospital. Alcohol abuse - patient expressed strong desire not to drink alcohol anymore. Psychiatry has been consulted, and they didn't feel like she requires inpatient psychiatric treatment at this time. Social work has been consulted and is talked to the patient several times about outpatient resources. She is inclined to followup with AA as an outpatient. Seems very determined not to drink anymore, and will be discharged to live with her daughter.  Acute encephalopathy - in the setting of #1 and #2, resolved. Rash on her back - mild, unclear etiology, resolved with prednisone. Quick taper at home. When necessary hydroxyzine.  Procedures:  none   Consultations:  Psychiatry   Neurology  Discharge Exam: Filed Vitals:   11/08/13 1358 11/08/13 2348 11/09/13 0420 11/09/13 0620  BP: 152/75 143/72 129/62 154/83  Pulse: 92 86 85 70  Temp: 98 F (36.7 C) 98.1 F (36.7 C) 97.8 F (36.6 C) 97.4 F (36.3 C)  TempSrc: Oral Oral Oral Oral  Resp: 20 19 16 18   Height:      Weight:      SpO2: 99% 100% 97% 100%   General: NAD Cardiovascular: RRR Respiratory: CTA biL  Discharge Instructions    Medication List         acetaminophen 500 MG tablet  Commonly known as:  TYLENOL  Take 500 mg by mouth every 6 (six) hours as needed for moderate pain.     albuterol 108 (90 BASE) MCG/ACT inhaler  Commonly known as:  PROVENTIL HFA;VENTOLIN HFA  Inhale 2 puffs into the lungs every 6 (six) hours  as needed for wheezing or shortness of breath.     divalproex 500 MG DR tablet  Commonly known as:  DEPAKOTE  Take 500 mg by mouth 2 (two) times daily.     hydrOXYzine 25 MG tablet  Commonly known as:  ATARAX/VISTARIL  Take 1 tablet (25 mg total) by mouth every 6 (six) hours as needed for itching.     levETIRAcetam 750 MG tablet  Commonly known as:  KEPPRA  Take 750 mg by mouth 2 (two) times daily.     multivitamin with minerals Tabs tablet  Take 1 tablet by mouth daily.      naproxen sodium 220 MG tablet  Commonly known as:  ANAPROX  Take 440 mg by mouth 2 (two) times daily as needed (pain).     predniSONE 20 MG tablet  Commonly known as:  DELTASONE  Take 2 tablets (40 mg total) by mouth daily with breakfast. 2 tablets daily for 2 days then 1 tablet daily for 2 days then 1/2 tablet daily for 2 days.       Follow-up Information   Follow up with [name], MD. Schedule an appointment as soon as possible for a visit in 1 week.   Specialty:  Family Medicine   Contact information:   [address] [place] [address] [phone]      The results of significant diagnostics from this hospitalization (including imaging, microbiology, ancillary and laboratory) are listed below for reference.    Significant Diagnostic Studies: Ct Head Wo Contrast  11/05/2013   CLINICAL DATA:  Seizure, altered mental status.  EXAM: CT HEAD WITHOUT CONTRAST  TECHNIQUE: Contiguous axial images were obtained from the base of the skull through the vertex without intravenous contrast.  COMPARISON:  None.  FINDINGS: Bony calvarium appears intact. Small right frontal scalp hematoma is noted. No mass effect or midline shift is noted. Ventricular size is within normal limits. There is no evidence of mass lesion, hemorrhage or acute infarction.  IMPRESSION: Small right frontal scalp hematoma. No acute intracranial abnormality seen.   Electronically Signed   By: Sabino Dick M.D.   On: 11/05/2013 20:35   Dg Chest Portable 1 View  11/05/2013   CLINICAL DATA:  Altered mental status.  Seizures.  EXAM: PORTABLE CHEST - 1 VIEW  COMPARISON:  No comparisons  FINDINGS: Cardiopericardial silhouette within normal limits. Mediastinal contours normal. Trachea midline. No airspace disease or effusion. Monitoring leads project over the chest. Mild basilar atelectasis on the right.  IMPRESSION: No acute cardiopulmonary disease.   Electronically Signed   By: Dereck Ligas M.D.   On: 11/05/2013 21:15    Microbiology: Recent Results (from the past 240 hour(s))  URINE CULTURE     Status: None   Collection Time    11/05/13  8:51 PM      Result Value Ref Range Status   Specimen Description URINE, CATHETERIZED   Final   Special Requests NONE   Final   Culture  Setup Time     Final   Value: 11/06/2013 04:59     Performed at [facility]     Final   Value: NO GROWTH     Performed at [facility]   Culture     Final   Value: NO GROWTH     Performed at [facility]   Report Status 11/07/2013 FINAL   Final  CSF CULTURE     Status: None   Collection Time  11/06/13  1:05 AM      Result Value Ref Range Status   Specimen Description CSF   Final   Special Requests NONE   Final   Gram Stain     Final   Value: CYTOSPIN NO WBC SEEN     NO ORGANISMS SEEN     Performed at [facility] Gram Stain Report Called to,Read Back By and Verified With: Gram Stain Report Called to,Read Back By and Verified With: [facility] M/2W AT [place] ON 11/06/13 BY KARCZEWSKI S     Performed at [facility]   Culture     Final   Value: NO GROWTH 3 DAYS     Performed at [facility]   Report Status 11/09/2013 FINAL   Final  GRAM STAIN     Status: None   Collection Time    11/06/13  1:05 AM      Result Value Ref Range Status   Specimen Description CSF   Final   Special Requests NONE   Final   Gram Stain     Final   Value: NO ORGANISMS SEEN     NO WBC SEEN     Gram Stain Report Called to,Read Back By and Verified With: KALLAM,M/2W @0251  ON 11/06/13 BY KARCZEWSKI,S.     CYTOSPIN PREP   Report Status 11/06/2013 FINAL   Final  CULTURE, BLOOD (ROUTINE X 2)     Status: None   Collection Time    11/06/13  2:14 AM      Result Value Ref Range Status   Specimen Description BLOOD LEFT ANTECUBITAL   Final   Special Requests BOTTLES DRAWN AEROBIC ONLY 1CC   Final   Culture  Setup Time     Final   Value: 11/06/2013 08:33     Performed at [facility]    Culture     Final   Value:        BLOOD CULTURE RECEIVED NO GROWTH TO DATE CULTURE WILL BE HELD FOR 5 DAYS BEFORE ISSUING A FINAL NEGATIVE REPORT     Performed at [facility]   Report Status PENDING   Incomplete  MRSA PCR SCREENING     Status: None   Collection Time    11/06/13  4:58 AM      Result Value Ref Range Status   MRSA by PCR NEGATIVE  NEGATIVE Final   Comment:            The GeneXpert MRSA Assay (FDA     approved for NASAL specimens     only), is one component of a     comprehensive MRSA colonization     surveillance program. It is not     intended to diagnose MRSA     infection nor to guide or     monitor treatment for     MRSA infections.  CULTURE, BLOOD (ROUTINE X 2)     Status: None   Collection Time    11/06/13  5:00 AM      Result Value Ref Range Status   Specimen Description BLOOD LEFT HAND   Final   Special Requests BOTTLES DRAWN AEROBIC AND ANAEROBIC [facility]   Final   Culture  Setup Time     Final   Value: 11/06/2013 10:21     Performed at [facility]   Culture     Final   Value:        BLOOD CULTURE RECEIVED NO GROWTH TO  DATE CULTURE WILL BE HELD FOR 5 DAYS BEFORE ISSUING A FINAL NEGATIVE REPORT     Performed at [facility]   Report Status PENDING   Incomplete   Labs: Basic Metabolic Panel:  Recent Labs Lab 11/05/13 2139 11/06/13 0215  NA 140 139  K 3.8 4.0  CL 103 102  CO2 22 23  GLUCOSE 91 90  BUN 10 9  CREATININE 0.70 0.70  CALCIUM 9.2 8.8   Liver Function Tests:  Recent Labs Lab 11/06/13 0215  AST 22  ALT 11  ALKPHOS 93  BILITOT 1.1  PROT 6.9  ALBUMIN 3.6   CBC:  Recent Labs Lab 11/05/13 2139 11/06/13 0215  WBC 9.8 8.9  HGB 13.3 12.9  HCT 38.9 37.7  MCV 91.5 90.6  PLT 454* 475*   Signed:  Jhair Witherington  [facility] 11/09/2013, 1:48 PM

## 2013-11-12 LAB — CULTURE, BLOOD (ROUTINE X 2)
CULTURE: NO GROWTH
Culture: NO GROWTH

## 2014-01-28 ENCOUNTER — Emergency Department: Payer: Self-pay | Admitting: Emergency Medicine

## 2014-01-28 LAB — URINALYSIS, COMPLETE
BILIRUBIN, UR: NEGATIVE
Bacteria: NONE SEEN
Glucose,UR: NEGATIVE mg/dL (ref 0–75)
Leukocyte Esterase: NEGATIVE
NITRITE: NEGATIVE
Ph: 5 (ref 4.5–8.0)
Protein: NEGATIVE
RBC,UR: 2 /HPF (ref 0–5)
Specific Gravity: 1.015 (ref 1.003–1.030)
WBC UR: 2 /HPF (ref 0–5)

## 2014-01-28 LAB — CBC
HCT: 41.9 % (ref 35.0–47.0)
HGB: 13.8 g/dL (ref 12.0–16.0)
MCH: 30.9 pg (ref 26.0–34.0)
MCHC: 32.8 g/dL (ref 32.0–36.0)
MCV: 94 fL (ref 80–100)
Platelet: 414 10*3/uL (ref 150–440)
RBC: 4.46 10*6/uL (ref 3.80–5.20)
RDW: 14.2 % (ref 11.5–14.5)
WBC: 9.6 10*3/uL (ref 3.6–11.0)

## 2014-01-28 LAB — VALPROIC ACID LEVEL: Valproic Acid: 4 ug/mL — ABNORMAL LOW

## 2014-01-28 LAB — COMPREHENSIVE METABOLIC PANEL
ALK PHOS: 112 U/L
ALT: 24 U/L (ref 12–78)
Albumin: 3.9 g/dL (ref 3.4–5.0)
Anion Gap: 9 (ref 7–16)
BILIRUBIN TOTAL: 0.8 mg/dL (ref 0.2–1.0)
BUN: 15 mg/dL (ref 7–18)
CALCIUM: 9.4 mg/dL (ref 8.5–10.1)
Chloride: 105 mmol/L (ref 98–107)
Co2: 23 mmol/L (ref 21–32)
Creatinine: 0.77 mg/dL (ref 0.60–1.30)
EGFR (African American): >60
EGFR (Non-African Amer.): >60
Glucose: 82 mg/dL (ref 65–99)
Osmolality: 274 (ref 275–301)
POTASSIUM: 4.8 mmol/L (ref 3.5–5.1)
SGOT(AST): 38 U/L — ABNORMAL HIGH (ref 15–37)
SODIUM: 137 mmol/L (ref 136–145)
Total Protein: 8.2 g/dL (ref 6.4–8.2)

## 2014-01-28 LAB — DRUG SCREEN, URINE
Amphetamines, Ur Screen: NEGATIVE (ref ?–1000)
BENZODIAZEPINE, UR SCRN: NEGATIVE (ref ?–200)
Barbiturates, Ur Screen: NEGATIVE (ref ?–200)
CANNABINOID 50 NG, UR ~~LOC~~: POSITIVE (ref ?–50)
Cocaine Metabolite,Ur ~~LOC~~: NEGATIVE (ref ?–300)
MDMA (Ecstasy)Ur Screen: NEGATIVE (ref ?–500)
Methadone, Ur Screen: NEGATIVE (ref ?–300)
OPIATE, UR SCREEN: NEGATIVE (ref ?–300)
PHENCYCLIDINE (PCP) UR S: NEGATIVE (ref ?–25)
Tricyclic, Ur Screen: NEGATIVE (ref ?–1000)

## 2014-01-28 LAB — ETHANOL: Ethanol: <3 mg/dL

## 2014-01-28 LAB — LIPASE, BLOOD: Lipase: 77 U/L (ref 73–393)

## 2014-01-28 LAB — TROPONIN I: TROPONIN-I: 0.05 ng/mL

## 2014-01-28 LAB — MAGNESIUM: Magnesium: 1.8 mg/dL

## 2014-03-04 ENCOUNTER — Encounter (HOSPITAL_COMMUNITY): Payer: Self-pay | Admitting: Emergency Medicine

## 2014-03-04 ENCOUNTER — Emergency Department (HOSPITAL_COMMUNITY): Payer: Medicaid Other

## 2014-03-04 ENCOUNTER — Inpatient Hospital Stay (HOSPITAL_COMMUNITY)
Admission: EM | Admit: 2014-03-04 | Discharge: 2014-03-06 | DRG: 100 | Disposition: A | Discharge status: Home / Self Care | Payer: Medicaid Other | Attending: Internal Medicine | Admitting: Internal Medicine

## 2014-03-04 DIAGNOSIS — F10939 Alcohol use, unspecified with withdrawal, unspecified: Secondary | ICD-10-CM | POA: Diagnosis present

## 2014-03-04 DIAGNOSIS — Z23 Encounter for immunization: Secondary | ICD-10-CM

## 2014-03-04 DIAGNOSIS — G40909 Epilepsy, unspecified, not intractable, without status epilepticus: Principal | ICD-10-CM | POA: Diagnosis present

## 2014-03-04 DIAGNOSIS — E860 Dehydration: Secondary | ICD-10-CM | POA: Diagnosis present

## 2014-03-04 DIAGNOSIS — R509 Fever, unspecified: Secondary | ICD-10-CM | POA: Diagnosis present

## 2014-03-04 DIAGNOSIS — Z9119 Patient's noncompliance with other medical treatment and regimen: Secondary | ICD-10-CM

## 2014-03-04 DIAGNOSIS — F1010 Alcohol abuse, uncomplicated: Secondary | ICD-10-CM | POA: Diagnosis present

## 2014-03-04 DIAGNOSIS — F10239 Alcohol dependence with withdrawal, unspecified: Secondary | ICD-10-CM | POA: Diagnosis present

## 2014-03-04 DIAGNOSIS — E872 Acidosis, unspecified: Secondary | ICD-10-CM | POA: Diagnosis present

## 2014-03-04 DIAGNOSIS — F1011 Alcohol abuse, in remission: Secondary | ICD-10-CM | POA: Diagnosis present

## 2014-03-04 DIAGNOSIS — F1020 Alcohol dependence, uncomplicated: Secondary | ICD-10-CM | POA: Diagnosis present

## 2014-03-04 DIAGNOSIS — Z91199 Patient's noncompliance with other medical treatment and regimen due to unspecified reason: Secondary | ICD-10-CM

## 2014-03-04 DIAGNOSIS — E876 Hypokalemia: Secondary | ICD-10-CM | POA: Diagnosis present

## 2014-03-04 DIAGNOSIS — F1210 Cannabis abuse, uncomplicated: Secondary | ICD-10-CM | POA: Diagnosis present

## 2014-03-04 DIAGNOSIS — E43 Unspecified severe protein-calorie malnutrition: Secondary | ICD-10-CM | POA: Diagnosis present

## 2014-03-04 DIAGNOSIS — Z79899 Other long term (current) drug therapy: Secondary | ICD-10-CM

## 2014-03-04 DIAGNOSIS — R4182 Altered mental status, unspecified: Secondary | ICD-10-CM

## 2014-03-04 DIAGNOSIS — G9340 Encephalopathy, unspecified: Secondary | ICD-10-CM | POA: Diagnosis present

## 2014-03-04 DIAGNOSIS — R569 Unspecified convulsions: Secondary | ICD-10-CM

## 2014-03-04 DIAGNOSIS — Z6825 Body mass index (BMI) 25.0-25.9, adult: Secondary | ICD-10-CM

## 2014-03-04 DIAGNOSIS — J45909 Unspecified asthma, uncomplicated: Secondary | ICD-10-CM | POA: Diagnosis present

## 2014-03-04 HISTORY — DX: Unspecified asthma, uncomplicated: J45.909

## 2014-03-04 LAB — COMPREHENSIVE METABOLIC PANEL
ALBUMIN: 4 g/dL (ref 3.5–5.2)
ALT: 16 U/L (ref 0–35)
AST: 37 U/L (ref 0–37)
Alkaline Phosphatase: 107 U/L (ref 39–117)
BUN: 13 mg/dL (ref 6–23)
CALCIUM: 9.5 mg/dL (ref 8.4–10.5)
CO2: 17 mEq/L — ABNORMAL LOW (ref 19–32)
Chloride: 106 mEq/L (ref 96–112)
Creatinine, Ser: 0.73 mg/dL (ref 0.50–1.10)
GFR calc non Af Amer: >90 mL/min (ref >90)
GLUCOSE: 110 mg/dL — AB (ref 70–99)
POTASSIUM: 4.1 meq/L (ref 3.7–5.3)
SODIUM: 147 meq/L (ref 137–147)
TOTAL PROTEIN: 8 g/dL (ref 6.0–8.3)
Total Bilirubin: 0.3 mg/dL (ref 0.3–1.2)

## 2014-03-04 LAB — CBC WITH DIFFERENTIAL/PLATELET
BASOS ABS: 0 10*3/uL (ref 0.0–0.1)
BASOS PCT: 0 % (ref 0–1)
EOS ABS: 0 10*3/uL (ref 0.0–0.7)
EOS PCT: 0 % (ref 0–5)
HCT: 44.5 % (ref 36.0–46.0)
Hemoglobin: 15.3 g/dL — ABNORMAL HIGH (ref 12.0–15.0)
Lymphocytes Relative: 10 % — ABNORMAL LOW (ref 12–46)
Lymphs Abs: 0.8 10*3/uL (ref 0.7–4.0)
MCH: 31.7 pg (ref 26.0–34.0)
MCHC: 34.4 g/dL (ref 30.0–36.0)
MCV: 92.3 fL (ref 78.0–100.0)
Monocytes Absolute: 0.5 10*3/uL (ref 0.1–1.0)
Monocytes Relative: 6 % (ref 3–12)
NEUTROS PCT: 84 % — AB (ref 43–77)
Neutro Abs: 7.3 10*3/uL (ref 1.7–7.7)
PLATELETS: 310 10*3/uL (ref 150–400)
RBC: 4.82 MIL/uL (ref 3.87–5.11)
RDW: 13.7 % (ref 11.5–15.5)
WBC: 8.7 10*3/uL (ref 4.0–10.5)

## 2014-03-04 LAB — ETHANOL: Alcohol, Ethyl (B): <11 mg/dL (ref 0–11)

## 2014-03-04 LAB — VALPROIC ACID LEVEL: Valproic Acid Lvl: <10 ug/mL — ABNORMAL LOW (ref 50.0–100.0)

## 2014-03-04 LAB — URINALYSIS, ROUTINE W REFLEX MICROSCOPIC
Bilirubin Urine: NEGATIVE
Glucose, UA: NEGATIVE mg/dL
Ketones, ur: NEGATIVE mg/dL
Leukocytes, UA: NEGATIVE
NITRITE: NEGATIVE
PH: 5 (ref 5.0–8.0)
Protein, ur: NEGATIVE mg/dL
Specific Gravity, Urine: 1.018 (ref 1.005–1.030)
Urobilinogen, UA: 0.2 mg/dL (ref 0.0–1.0)

## 2014-03-04 LAB — I-STAT CG4 LACTIC ACID, ED: LACTIC ACID, VENOUS: 2.52 mmol/L — AB (ref 0.5–2.2)

## 2014-03-04 LAB — RAPID URINE DRUG SCREEN, HOSP PERFORMED
Amphetamines: NOT DETECTED
BENZODIAZEPINES: NOT DETECTED
Barbiturates: NOT DETECTED
COCAINE: NOT DETECTED
Opiates: NOT DETECTED
TETRAHYDROCANNABINOL: POSITIVE — AB

## 2014-03-04 LAB — URINE MICROSCOPIC-ADD ON

## 2014-03-04 LAB — PHOSPHORUS: PHOSPHORUS: 2.5 mg/dL (ref 2.3–4.6)

## 2014-03-04 LAB — MAGNESIUM: MAGNESIUM: 1.5 mg/dL (ref 1.5–2.5)

## 2014-03-04 LAB — TSH: TSH: 0.217 u[IU]/mL — ABNORMAL LOW (ref 0.350–4.500)

## 2014-03-04 MED ORDER — LORAZEPAM 2 MG/ML IJ SOLN
1.0000 mg | Freq: Once | INTRAMUSCULAR | Status: AC
  Administered 2014-03-04: 1 mg via INTRAMUSCULAR

## 2014-03-04 MED ORDER — THIAMINE HCL 100 MG/ML IJ SOLN
100.0000 mg | Freq: Every day | INTRAMUSCULAR | Status: DC
  Administered 2014-03-04: 100 mg via INTRAVENOUS
  Filled 2014-03-04: qty 2

## 2014-03-04 MED ORDER — LORAZEPAM 1 MG PO TABS: 0.0000 mg | ORAL_TABLET | Freq: Four times a day (QID) | ORAL | Status: DC

## 2014-03-04 MED ORDER — LORAZEPAM 1 MG PO TABS: 1.0000 mg | ORAL_TABLET | Freq: Four times a day (QID) | ORAL | Status: DC | PRN

## 2014-03-04 MED ORDER — DEXTROSE 5 % IV SOLN
500.0000 mg | Freq: Two times a day (BID) | INTRAVENOUS | Status: DC
  Administered 2014-03-05 (×2): 500 mg via INTRAVENOUS
  Filled 2014-03-04 (×3): qty 5

## 2014-03-04 MED ORDER — LORAZEPAM 2 MG/ML IJ SOLN
1.0000 mg | Freq: Once | INTRAMUSCULAR | Status: DC
  Filled 2014-03-04: qty 1

## 2014-03-04 MED ORDER — VALPROATE SODIUM 500 MG/5ML IV SOLN
500.0000 mg | Freq: Once | INTRAVENOUS | Status: AC
  Administered 2014-03-04: 500 mg via INTRAVENOUS
  Filled 2014-03-04: qty 5

## 2014-03-04 MED ORDER — ONDANSETRON HCL 4 MG/2ML IJ SOLN: 4.0000 mg | Freq: Four times a day (QID) | INTRAMUSCULAR | Status: DC | PRN

## 2014-03-04 MED ORDER — LEVETIRACETAM 500 MG PO TABS
500.0000 mg | ORAL_TABLET | Freq: Once | ORAL | Status: AC
  Administered 2014-03-04: 500 mg via ORAL
  Filled 2014-03-04: qty 1

## 2014-03-04 MED ORDER — ACETAMINOPHEN 650 MG RE SUPP
650.0000 mg | Freq: Four times a day (QID) | RECTAL | Status: DC | PRN
Start: 2014-03-04 — End: 2014-03-06
  Administered 2014-03-04: 650 mg via RECTAL
  Filled 2014-03-04: qty 1

## 2014-03-04 MED ORDER — SODIUM CHLORIDE 0.9 % IV BOLUS (SEPSIS)
1000.0000 mL | Freq: Once | INTRAVENOUS | Status: AC
  Administered 2014-03-04: 1000 mL via INTRAVENOUS

## 2014-03-04 MED ORDER — LORAZEPAM 1 MG PO TABS: 0.0000 mg | ORAL_TABLET | Freq: Two times a day (BID) | ORAL | Status: DC

## 2014-03-04 MED ORDER — SODIUM CHLORIDE 0.9 % IV SOLN
INTRAVENOUS | Status: DC
  Administered 2014-03-04: via INTRAVENOUS

## 2014-03-04 MED ORDER — HEPARIN SODIUM (PORCINE) 5000 UNIT/ML IJ SOLN
5000.0000 [IU] | Freq: Three times a day (TID) | INTRAMUSCULAR | Status: DC
  Administered 2014-03-04 – 2014-03-05 (×3): 5000 [IU] via SUBCUTANEOUS
  Filled 2014-03-04 (×5): qty 1

## 2014-03-04 MED ORDER — ADULT MULTIVITAMIN W/MINERALS CH
1.0000 | ORAL_TABLET | Freq: Every day | ORAL | Status: DC
  Administered 2014-03-05 – 2014-03-06 (×2): 1 via ORAL
  Filled 2014-03-04 (×3): qty 1

## 2014-03-04 MED ORDER — ALBUTEROL SULFATE HFA 108 (90 BASE) MCG/ACT IN AERS: 2.0000 | INHALATION_SPRAY | Freq: Four times a day (QID) | RESPIRATORY_TRACT | Status: DC | PRN

## 2014-03-04 MED ORDER — SODIUM CHLORIDE 0.9 % IV SOLN
750.0000 mg | Freq: Two times a day (BID) | INTRAVENOUS | Status: DC
  Administered 2014-03-04 – 2014-03-05 (×2): 750 mg via INTRAVENOUS
  Filled 2014-03-04 (×3): qty 7.5

## 2014-03-04 MED ORDER — VITAMIN B-1 100 MG PO TABS
100.0000 mg | ORAL_TABLET | Freq: Every day | ORAL | Status: DC
  Administered 2014-03-05 – 2014-03-06 (×2): 100 mg via ORAL
  Filled 2014-03-04 (×3): qty 1

## 2014-03-04 MED ORDER — LORAZEPAM 2 MG/ML IJ SOLN
0.0000 mg | Freq: Two times a day (BID) | INTRAMUSCULAR | Status: DC
  Administered 2014-03-04: 2 mg via INTRAVENOUS
  Filled 2014-03-04: qty 1

## 2014-03-04 MED ORDER — ALBUTEROL SULFATE (2.5 MG/3ML) 0.083% IN NEBU: 2.5000 mg | INHALATION_SOLUTION | Freq: Four times a day (QID) | RESPIRATORY_TRACT | Status: DC | PRN

## 2014-03-04 MED ORDER — LEVOFLOXACIN IN D5W 750 MG/150ML IV SOLN
750.0000 mg | INTRAVENOUS | Status: DC
  Administered 2014-03-04: 750 mg via INTRAVENOUS
  Filled 2014-03-04 (×2): qty 150

## 2014-03-04 MED ORDER — SODIUM CHLORIDE 0.9 % IJ SOLN
3.0000 mL | Freq: Two times a day (BID) | INTRAMUSCULAR | Status: DC
  Administered 2014-03-04 – 2014-03-06 (×3): 3 mL via INTRAVENOUS

## 2014-03-04 MED ORDER — ACETAMINOPHEN 325 MG PO TABS
650.0000 mg | ORAL_TABLET | Freq: Four times a day (QID) | ORAL | Status: DC | PRN
  Administered 2014-03-05 (×2): 650 mg via ORAL
  Filled 2014-03-04 (×2): qty 2

## 2014-03-04 MED ORDER — FOLIC ACID 1 MG PO TABS
1.0000 mg | ORAL_TABLET | Freq: Every day | ORAL | Status: DC
  Administered 2014-03-05 – 2014-03-06 (×2): 1 mg via ORAL
  Filled 2014-03-04 (×3): qty 1

## 2014-03-04 MED ORDER — LORAZEPAM 2 MG/ML IJ SOLN: 1.0000 mg | Freq: Four times a day (QID) | INTRAMUSCULAR | Status: DC | PRN

## 2014-03-04 MED ORDER — HYDROXYZINE HCL 25 MG PO TABS
25.0000 mg | ORAL_TABLET | Freq: Four times a day (QID) | ORAL | Status: DC | PRN
Start: 2014-03-04 — End: 2014-03-06

## 2014-03-04 MED ORDER — LORAZEPAM 2 MG/ML IJ SOLN: 0.0000 mg | Freq: Four times a day (QID) | INTRAMUSCULAR | Status: DC

## 2014-03-04 MED ORDER — ONDANSETRON HCL 4 MG PO TABS
4.0000 mg | ORAL_TABLET | Freq: Four times a day (QID) | ORAL | Status: DC | PRN
  Administered 2014-03-05: 4 mg via ORAL
  Filled 2014-03-04: qty 1

## 2014-03-04 MED ORDER — LEVETIRACETAM 750 MG PO TABS
750.0000 mg | ORAL_TABLET | Freq: Two times a day (BID) | ORAL | Status: DC
  Filled 2014-03-04: qty 1

## 2014-03-04 MED ORDER — VITAMIN B-1 100 MG PO TABS: 100.0000 mg | ORAL_TABLET | Freq: Every day | ORAL | Status: DC

## 2014-03-04 MED ORDER — ACETAMINOPHEN 500 MG PO TABS: 500.0000 mg | ORAL_TABLET | Freq: Four times a day (QID) | ORAL | Status: DC | PRN

## 2014-03-04 MED ORDER — ACETAMINOPHEN 325 MG PO TABS
650.0000 mg | ORAL_TABLET | Freq: Once | ORAL | Status: AC
  Administered 2014-03-04: 650 mg via ORAL
  Filled 2014-03-04: qty 2

## 2014-03-04 MED ORDER — DIVALPROEX SODIUM 500 MG PO DR TAB
500.0000 mg | DELAYED_RELEASE_TABLET | Freq: Two times a day (BID) | ORAL | Status: DC
  Filled 2014-03-04: qty 1

## 2014-03-04 MED ORDER — ADULT MULTIVITAMIN W/MINERALS CH
1.0000 | ORAL_TABLET | Freq: Every day | ORAL | Status: DC
  Filled 2014-03-04 (×2): qty 1

## 2014-03-04 MED ORDER — THIAMINE HCL 100 MG/ML IJ SOLN
100.0000 mg | Freq: Every day | INTRAMUSCULAR | Status: DC
  Filled 2014-03-04 (×2): qty 1

## 2014-03-04 NOTE — ED Notes (Signed)
Attempted for IV access x 2.

## 2014-03-04 NOTE — H&P (Signed)
[facility] History and Physical  Lindsey [name] OEU:[id number] DOB: [birth year] DOA: 03/04/2014  Referring physician: Dr. Zenia Resides PCP: [name], MD   Chief Complaint: seizure   HPI: Lindsey [name] is a 58 y.o. female with pmh significant for asthma, seizure and alcohol abuse; came to ED secondary to 4 episodes of witnessed seizure. Per family reports to ED physician, patient has not been compliant with medications and has been drinking significantly since June the 1st. There is also reports of some ongoing diarrhea; but no fever, nausea or vomiting at home. In ED patient is obtunded/post ictal and her antiepileptic levels are subtherapeutic. [facility] called to admit patient for further evaluation and treatment. While in ED she was also found to be febrile.    Review of Systems:  unable to reviewed due to patient mental status.   Past Medical History  Diagnosis Date  . Seizures   . Asthma    History reviewed. No pertinent past surgical history.  Social History:  reports that she has never smoked. She does not have any smokeless tobacco history on file. She reports that she drinks alcohol. Her drug history is not on file.  Allergies  Allergen Reactions  . Morphine And Related   . Penicillins Nausea Only    Family hx unable to reviewed due to patient mental status.  Prior to Admission medications   Medication Sig Start Date End Date Taking? Authorizing Provider  acetaminophen (TYLENOL) 500 MG tablet Take 500 mg by mouth every 6 (six) hours as needed for moderate pain.    Historical Provider, MD  albuterol (PROVENTIL HFA;VENTOLIN HFA) 108 (90 BASE) MCG/ACT inhaler Inhale 2 puffs into the lungs every 6 (six) hours as needed for wheezing or shortness of breath.    Historical Provider, MD  divalproex (DEPAKOTE) 500 MG DR tablet Take 500 mg by mouth 2 (two) times daily.    Historical Provider, MD  hydrOXYzine (ATARAX/VISTARIL) 25 MG tablet Take 1 tablet (25 mg total) by mouth  every 6 (six) hours as needed for itching. 11/09/13   Costin Karlyne Greenspan, MD  levETIRAcetam (KEPPRA) 750 MG tablet Take 750 mg by mouth 2 (two) times daily.    Historical Provider, MD  Multiple Vitamin (MULTIVITAMIN WITH MINERALS) TABS tablet Take 1 tablet by mouth daily. 11/09/13   Costin Karlyne Greenspan, MD  naproxen sodium (ANAPROX) 220 MG tablet Take 440 mg by mouth 2 (two) times daily as needed (pain).    Historical Provider, MD  predniSONE (DELTASONE) 20 MG tablet Take 2 tablets (40 mg total) by mouth daily with breakfast. 2 tablets daily for 2 days then 1 tablet daily for 2 days then 1/2 tablet daily for 2 days. 11/09/13   Costin Karlyne Greenspan, MD   Physical Exam: Filed Vitals:   03/04/14 1825  BP: 120/71  Pulse: 111  Temp: 102.3 F (39.1 C)  Resp: 26    BP 120/71  Pulse 111  Temp(Src) 102.3 F (39.1 C) (Rectal)  Resp 26  SpO2 98%  General:  Appears calm; NAD; oriented to herself only; able to answer some questions; but mainly post ictal. Eyes: PERRL, normal lids, irises & conjunctiva; no icterus, no nystagmus ENT: grossly normal hearing, lips & tongue; no exudates or erythema inside her mouth Neck: no LAD, masses or thyromegaly, no JVD Cardiovascular: no rubs, murmurs or gallops; positive tachycardia Respiratory: positive rhonchi, no wheezing or crackles; no work of breathing  Abdomen: soft, nt, nd; positive BS Skin: no rash or induration seen on limited exam  Musculoskeletal: grossly normal tone BUE/BLE Psychiatric: obtunded/post ictal unable to fully evaluate Neurologic: grossly non-focal.          Labs on Admission:  Basic Metabolic Panel:  Recent Labs Lab 03/04/14 1201  NA 147  K 4.1  CL 106  CO2 17*  GLUCOSE 110*  BUN 13  CREATININE 0.73  CALCIUM 9.5   Liver Function Tests:  Recent Labs Lab 03/04/14 1201  AST 37  ALT 16  ALKPHOS 107  BILITOT 0.3  PROT 8.0  ALBUMIN 4.0   CBC:  Recent Labs Lab 03/04/14 1201  WBC 8.7  NEUTROABS 7.3  HGB 15.3*  HCT  44.5  MCV 92.3  PLT 310   CBG: No results found for this basename: GLUCAP,  in the last 168 hours  Radiological Exams on Admission: Dg Chest 2 View  03/04/2014   CLINICAL DATA:  Altered mental status. Nausea vomiting and seizures. Asthma.  EXAM: CHEST  2 VIEW  COMPARISON:  11/05/2013  FINDINGS: Cardiac leads project over the chest. Stable heart and mediastinal contours. Pulmonary vascularity is normal. Lung volumes are low normal and the lungs are clear. Negative for pleural effusion or pneumothorax. Probable remote healed fracture deformity of the proximal right humerus, unchanged. No acute bony abnormality identified.  IMPRESSION: No acute cardiopulmonary disease.   Electronically Signed   By: Curlene Dolphin M.D.   On: 03/04/2014 12:48   Ct Head Wo Contrast  03/04/2014   CLINICAL DATA:  Seizures with recent fall  EXAM: CT HEAD WITHOUT CONTRAST  TECHNIQUE: Contiguous axial images were obtained from the base of the skull through the vertex without intravenous contrast.  COMPARISON:  11/05/2013  FINDINGS: The bony calvarium is intact. The ventricles and cortical sulci are mildly prominent consistent with mild cortical atrophy. This is stable from the prior exam. No findings to suggest acute hemorrhage, acute infarction or space-occupying mass lesion are noted.  IMPRESSION: Chronic changes without acute abnormality.   Electronically Signed   By: Inez Catalina M.D.   On: 03/04/2014 13:03    Assessment/Plan 1-Seizure: patient with hx of seizure disorders, medication non-compliance and alcohol abuse. -admit to telemetry (for ciwa/seizure monitoring) -patient loaded with keppra and depakote in ED -slightly more alert/awake; will continue home meds -needs to stop drinking and been compliant with meds  2-Alcohol abuse and Marijuana abuse: cessation counseling provided. Patient is obtunded/post ictal; will benefit of further counseling. -SW to provide resources as well -CIWA for alcohol  abuse -thiamine/folic acid  3-Protein-calorie malnutrition, severe: nutrition consult. -ensure/boost TID once tolerating full PO's  4-acute Encephalopathy: post ictal from seizure; also with alcohol consumption/maybe withdrawal. Patient received ativan in ED as well that can contribute to altered mentation. Elevated temp, with concerns for asp PNA. -provide supportive care -CIWA for alcohol -will check TSH/B12 -start levaquin and use PRN antipyretics   5-Fever: in patient with active seizure. Presumed to be 2/2 pneumonitis/asp pneumonia -will treat with levaquin  6-diarrhea: will check GI pathogen by PCR -contact isolation  7-mild lactic acidosis: due to dehydration and seizure -will provide IVF's and supportive care -follow lactic acid in am  DVT: heparin   Code Status: Full Family Communication: no family at bedside Disposition Plan: inpatient, LOS > 2 midnights, telemetry   Time spent: 50 minutes  Barton Dubois [facility] Pager [phone]  **Disclaimer: This note may have been dictated with voice recognition software. Similar sounding words can inadvertently be transcribed and this note may contain transcription errors which may not have been corrected  upon publication of note.**

## 2014-03-04 NOTE — Progress Notes (Signed)
Patient is lethargic at this point. NP Baltazar Najjar, has been notified. P.O. Medication can be held at this time. Np, Baltazar Najjar has changed needed medications to IV. RN will continue to monitor

## 2014-03-04 NOTE — Progress Notes (Signed)
Per RN, pt is too lethargic after Ativan dosing in ED to take po seizure meds. Keppra and Depakote changed to IV. Baltazar Najjar, NP

## 2014-03-04 NOTE — ED Notes (Signed)
IV team at bedside 

## 2014-03-04 NOTE — ED Notes (Signed)
Pharmacy called for medications. 

## 2014-03-04 NOTE — ED Notes (Signed)
Paged [facility] to [phone]

## 2014-03-04 NOTE — ED Provider Notes (Signed)
CSN: [id number]     Arrival date & time 03/04/14  1107 History   First MD Initiated Contact with Patient 03/04/14 1109     Chief Complaint  Patient presents with  . Seizures     (Consider location/radiation/quality/duration/timing/severity/associated sxs/prior Treatment) The history is provided by the EMS personnel and medical records.   Level 5 Caveat:  AMS, post-ictal, hx provided by EMS and medical records. This is a 58 year old female with past medical history significant for seizure disorder, asthma, alcohol abuse, medication noncompliance, presenting to the ED via EMS following 4 witnessed grand mal seizures at her home. Currently, she is altered, appears post-ictal, answering limited questions and following limited commands. Patient is usually on depakote and keppra for seizure control, cannot tell me the last time she took these medications.  Family informs EMS that patient has been drinking alcohol continuously since June 1.  Pt cannot tell me how much or what she has been drinking.  On arrival, pt is oriented to self only.  Past Medical History  Diagnosis Date  . Seizures   . Asthma    History reviewed. No pertinent past surgical history. No family history on file. History  Substance Use Topics  . Smoking status: Never Smoker   . Smokeless tobacco: Not on file  . Alcohol Use: Yes   OB History   Grav Para Term Preterm Abortions TAB SAB Ect Mult Living                 Review of Systems  Unable to perform ROS: Mental status change      Allergies  Morphine and related and Penicillins  Home Medications   Prior to Admission medications   Medication Sig Start Date End Date Taking? Authorizing Provider  acetaminophen (TYLENOL) 500 MG tablet Take 500 mg by mouth every 6 (six) hours as needed for moderate pain.    Historical Provider, MD  albuterol (PROVENTIL HFA;VENTOLIN HFA) 108 (90 BASE) MCG/ACT inhaler Inhale 2 puffs into the lungs every 6 (six) hours as needed for  wheezing or shortness of breath.    Historical Provider, MD  divalproex (DEPAKOTE) 500 MG DR tablet Take 500 mg by mouth 2 (two) times daily.    Historical Provider, MD  hydrOXYzine (ATARAX/VISTARIL) 25 MG tablet Take 1 tablet (25 mg total) by mouth every 6 (six) hours as needed for itching. 11/09/13   Costin Karlyne Greenspan, MD  levETIRAcetam (KEPPRA) 750 MG tablet Take 750 mg by mouth 2 (two) times daily.    Historical Provider, MD  Multiple Vitamin (MULTIVITAMIN WITH MINERALS) TABS tablet Take 1 tablet by mouth daily. 11/09/13   Costin Karlyne Greenspan, MD  naproxen sodium (ANAPROX) 220 MG tablet Take 440 mg by mouth 2 (two) times daily as needed (pain).    Historical Provider, MD  predniSONE (DELTASONE) 20 MG tablet Take 2 tablets (40 mg total) by mouth daily with breakfast. 2 tablets daily for 2 days then 1 tablet daily for 2 days then 1/2 tablet daily for 2 days. 11/09/13   Costin Karlyne Greenspan, MD   BP 152/98  Pulse 103  Temp(Src) 98.4 F (36.9 C)  Resp 16  SpO2 100%  Physical Exam  Nursing note and vitals reviewed. Constitutional: She appears well-developed and well-nourished. No distress.  HENT:  Head: Normocephalic and atraumatic.  Mouth/Throat: Oropharynx is clear and moist.  Eyes: Conjunctivae and EOM are normal. Pupils are equal, round, and reactive to light.  Pupils pinpoint but reactive  Neck: Normal range of motion  and full passive range of motion without pain. Neck supple. No muscular tenderness present. No rigidity.  No meningeal signs  Cardiovascular: Normal rate, regular rhythm and normal heart sounds.   Pulmonary/Chest: Effort normal and breath sounds normal. No respiratory distress. She has no wheezes.  Abdominal: Soft. Bowel sounds are normal. There is no tenderness. There is no guarding.  Musculoskeletal: Normal range of motion.  Neurological: She has normal strength. She displays no tremor. No cranial nerve deficit or sensory deficit. She displays no seizure activity.  Awake,  oriented to self only; answering limited questions and following limited commands when prompted; spontaneously moving all 4 extremities with seemingly purposeful movements  Skin: Skin is warm and dry. She is not diaphoretic.  Psychiatric: She has a normal mood and affect.    ED Course  Procedures (including critical care time) Labs Review Labs Reviewed  CBC WITH DIFFERENTIAL - Abnormal; Notable for the following:    Hemoglobin 15.3 (*)    Neutrophils Relative % 84 (*)    Lymphocytes Relative 10 (*)    All other components within normal limits  COMPREHENSIVE METABOLIC PANEL - Abnormal; Notable for the following:    CO2 17 (*)    Glucose, Bld 110 (*)    All other components within normal limits  URINALYSIS, ROUTINE W REFLEX MICROSCOPIC - Abnormal; Notable for the following:    Hgb urine dipstick SMALL (*)    All other components within normal limits  URINE RAPID DRUG SCREEN (HOSP PERFORMED) - Abnormal; Notable for the following:    Tetrahydrocannabinol POSITIVE (*)    All other components within normal limits  VALPROIC ACID LEVEL - Abnormal; Notable for the following:    Valproic Acid Lvl <10.0 (*)    All other components within normal limits  URINE MICROSCOPIC-ADD ON - Abnormal; Notable for the following:    Bacteria, UA FEW (*)    Casts GRANULAR CAST (*)    All other components within normal limits  ETHANOL    Imaging Review Dg Chest 2 View  03/04/2014   CLINICAL DATA:  Altered mental status. Nausea vomiting and seizures. Asthma.  EXAM: CHEST  2 VIEW  COMPARISON:  11/05/2013  FINDINGS: Cardiac leads project over the chest. Stable heart and mediastinal contours. Pulmonary vascularity is normal. Lung volumes are low normal and the lungs are clear. Negative for pleural effusion or pneumothorax. Probable remote healed fracture deformity of the proximal right humerus, unchanged. No acute bony abnormality identified.  IMPRESSION: No acute cardiopulmonary disease.   Electronically Signed    By: Curlene Dolphin M.D.   On: 03/04/2014 12:48   Ct Head Wo Contrast  03/04/2014   CLINICAL DATA:  Seizures with recent fall  EXAM: CT HEAD WITHOUT CONTRAST  TECHNIQUE: Contiguous axial images were obtained from the base of the skull through the vertex without intravenous contrast.  COMPARISON:  11/05/2013  FINDINGS: The bony calvarium is intact. The ventricles and cortical sulci are mildly prominent consistent with mild cortical atrophy. This is stable from the prior exam. No findings to suggest acute hemorrhage, acute infarction or space-occupying mass lesion are noted.  IMPRESSION: Chronic changes without acute abnormality.   Electronically Signed   By: Inez Catalina M.D.   On: 03/04/2014 13:03     EKG Interpretation None      MDM   Final diagnoses:  Seizure  Fever  Alcohol abuse   58 y.o. F with known seizure d/o and EtOH abuse presenting to the ED following 4 witnessed seizures  PTA.  On arrival, pt appears post-ictal and is oriented to self only.  She has hx of medication non-compliance and arrives with nearly full bottles of depakote and keppra.  Pt somewhat tremulous on arrival, dose of ativan given.  Work-up initiated, will monitor closely.  Have spoken with pts daughter-- 4th seizure today occurred while patient was attempting to use the bathroom.  Pt has been having some diarrhea, no known fever.  No sick contacts or recent abx use.  Daughter states pt usually drinks 4-5 40 oz beer daily.  Last seizure prior to today was several weeks ago.  EKG sinus rhythm without ischemic change.  CT head negative for acute findings. Chest x-ray is clear.  Labs as above-- depakote level <10, UDS + for THC.  Pt was loaded with IV depakote and keppra.  She is now more awake and alert. She is able to follow commands and answer questions appropriately.  Pt did develop a fever while in the ED, will obtain lactic acid and blood cultures.  On review of medical records, pt had similar presentation in February  of this years-- had LP at that time which was normal.  Pt has no meningeal signs of exam today.  Lactic acid 2.52.  Pt has been given 2L fluid bolus.  Blood cultures pending.  No source of fever identified, possibly due to her GI bug.  Seizure activity likely multifactorial from her seizure disorder and EtOH withdrawal.  Case was discussed with Dr. Dyann Kief who will admit to observation, telemetry-- will hold abx for now as no source of fever has been identified.  GI stool pathogen panel ordered.  Temp admission orders placed. VS stable at this time.  Larene Pickett, PA-C 03/04/14 1907

## 2014-03-04 NOTE — ED Provider Notes (Addendum)
Medical screening examination/treatment/procedure(s) were conducted as a shared visit with non-physician practitioner(s) and myself.  I personally evaluated the patient during the encounter.   EKG Interpretation None        Date: 03/04/2014  Rate: 99  Rhythm: normal sinus rhythm  QRS Axis: normal  Intervals: normal  ST/T Wave abnormalities: nonspecific ST changes  Conduction Disutrbances:none  Narrative Interpretation:   Old EKG Reviewed: unchanged   Patient here after having 4 witnessed seizures prior to arrival. She has been noncompliant with her antiepileptic medication as well as continues to drink alcohol. Has a low-grade temperature here. On my exam, she has no nuchal rigidity. She is alert and oriented x4. He did not think that she has meningitis. She's given Ativan and I think that there is some component of alcohol chocolate as well as breakthrough seizures. Does also given Depakote 2. Also given Keppra will be admitted by the medicine service  Leota Jacobsen, MD 03/04/14 [place], MD 03/04/14 1640

## 2014-03-04 NOTE — ED Notes (Signed)
Pt needed to have perineal care performed before in and out performed;  Pt had fresh and dried fecal matter in vaginal area as well as tenderness/pain/discomfort to vaginal area during perineal care.  Visualization of raw spots in vaginal area reported to [place], [place]

## 2014-03-04 NOTE — Progress Notes (Signed)
[name] [id number] Admitted to 5031: 03/04/2014 7:41 PM Attending Provider: Barton Dubois, MD    Lindsey [name] is a 58 y.o. female patient admitted from ED lethargic, but arousable per ED RN,  Full Code, VSS - Blood pressure 120/71, pulse 111, temperature 102.3 F (39.1 C), temperature source Rectal, resp. rate 26, SpO2 98.00%., no c/o shortness of breath, no c/o chest pain, no distress noted. Tele # 20 placed and pt is currently running:sinus tachycardia.   IV site WDL:  forearm right, condition patent and no redness with a transparent dsg that's clean dry and intact.  Allergies:   Allergies  Allergen Reactions  . Morphine And Related   . Penicillins Nausea Only     Past Medical History  Diagnosis Date  . Seizures   . Asthma     History:  obtained from unobtainable from patient due to mental status.  Pt placed in bed with bed alarm on.  Skin, clean-dry- intact without evidence of bruising, or skin tears.   No evidence of skin break down noted on exam.    Will cont to monitor and assist as needed.  Dashawna Delbridge Murvin Natal, RN 03/04/2014 7:41 PM

## 2014-03-04 NOTE — ED Notes (Signed)
Per [facility]- pt had 4 witnessed grand mal seizures with a history of the same. Family states that she has not been taking her medication and has been drinking since June 1st. Pt fell from toilet and hit her head with the last of the witnessed seizures. Pt takes Depakote and Keppra to control seizures. Pt is still post-ictal and unable to answer questions at time of triage.

## 2014-03-05 ENCOUNTER — Encounter (HOSPITAL_COMMUNITY): Payer: Self-pay | Admitting: *Deleted

## 2014-03-05 LAB — CLOSTRIDIUM DIFFICILE BY PCR: Toxigenic C. Difficile by PCR: NEGATIVE

## 2014-03-05 LAB — COMPREHENSIVE METABOLIC PANEL
ALBUMIN: 3.1 g/dL — AB (ref 3.5–5.2)
ALK PHOS: 82 U/L (ref 39–117)
ALT: 11 U/L (ref 0–35)
AST: 24 U/L (ref 0–37)
BUN: 8 mg/dL (ref 6–23)
CO2: 22 mEq/L (ref 19–32)
Calcium: 8.8 mg/dL (ref 8.4–10.5)
Chloride: 106 mEq/L (ref 96–112)
Creatinine, Ser: 0.77 mg/dL (ref 0.50–1.10)
GFR calc Af Amer: >90 mL/min (ref >90)
GFR calc non Af Amer: >90 mL/min (ref >90)
Glucose, Bld: 88 mg/dL (ref 70–99)
POTASSIUM: 3.3 meq/L — AB (ref 3.7–5.3)
SODIUM: 142 meq/L (ref 137–147)
TOTAL PROTEIN: 6.4 g/dL (ref 6.0–8.3)
Total Bilirubin: 0.8 mg/dL (ref 0.3–1.2)

## 2014-03-05 LAB — CBC
HEMATOCRIT: 35.7 % — AB (ref 36.0–46.0)
HEMOGLOBIN: 12.3 g/dL (ref 12.0–15.0)
MCH: 30.8 pg (ref 26.0–34.0)
MCHC: 34.5 g/dL (ref 30.0–36.0)
MCV: 89.3 fL (ref 78.0–100.0)
Platelets: 277 10*3/uL (ref 150–400)
RBC: 4 MIL/uL (ref 3.87–5.11)
RDW: 13.4 % (ref 11.5–15.5)
WBC: 8.8 10*3/uL (ref 4.0–10.5)

## 2014-03-05 LAB — LACTIC ACID, PLASMA: Lactic Acid, Venous: 0.9 mmol/L (ref 0.5–2.2)

## 2014-03-05 LAB — VITAMIN B12: Vitamin B-12: 404 pg/mL (ref 211–911)

## 2014-03-05 MED ORDER — MAGNESIUM CHLORIDE 64 MG PO TBEC
2.0000 | DELAYED_RELEASE_TABLET | Freq: Every day | ORAL | Status: DC
  Administered 2014-03-05 – 2014-03-06 (×2): 128 mg via ORAL
  Filled 2014-03-05 (×2): qty 2

## 2014-03-05 MED ORDER — POTASSIUM CHLORIDE CRYS ER 20 MEQ PO TBCR
40.0000 meq | EXTENDED_RELEASE_TABLET | Freq: Two times a day (BID) | ORAL | Status: AC
  Administered 2014-03-05 (×2): 40 meq via ORAL
  Filled 2014-03-05 (×2): qty 2

## 2014-03-05 MED ORDER — ONDANSETRON 4 MG PO TBDP
4.0000 mg | ORAL_TABLET | Freq: Three times a day (TID) | ORAL | Status: DC
  Administered 2014-03-05 – 2014-03-06 (×2): 4 mg via ORAL
  Filled 2014-03-05 (×5): qty 1

## 2014-03-05 MED ORDER — ENSURE COMPLETE PO LIQD
237.0000 mL | Freq: Two times a day (BID) | ORAL | Status: DC
  Administered 2014-03-06: 237 mL via ORAL

## 2014-03-05 MED ORDER — MAGNESIUM SULFATE 40 MG/ML IJ SOLN
2.0000 g | Freq: Once | INTRAMUSCULAR | Status: AC
  Administered 2014-03-05: 2 g via INTRAVENOUS
  Filled 2014-03-05: qty 50

## 2014-03-05 MED ORDER — DIVALPROEX SODIUM 500 MG PO DR TAB
500.0000 mg | DELAYED_RELEASE_TABLET | Freq: Two times a day (BID) | ORAL | Status: DC
  Administered 2014-03-05 – 2014-03-06 (×2): 500 mg via ORAL
  Filled 2014-03-05 (×4): qty 1

## 2014-03-05 MED ORDER — LEVETIRACETAM 750 MG PO TABS
750.0000 mg | ORAL_TABLET | Freq: Two times a day (BID) | ORAL | Status: DC
  Administered 2014-03-06: 750 mg via ORAL
  Filled 2014-03-05 (×4): qty 1

## 2014-03-05 MED ORDER — PNEUMOCOCCAL VAC POLYVALENT 25 MCG/0.5ML IJ INJ
0.5000 mL | INJECTION | INTRAMUSCULAR | Status: AC
  Administered 2014-03-06: 0.5 mL via INTRAMUSCULAR
  Filled 2014-03-05: qty 0.5

## 2014-03-05 MED ORDER — CHLORHEXIDINE GLUCONATE 0.12 % MT SOLN
15.0000 mL | Freq: Two times a day (BID) | OROMUCOSAL | Status: DC
  Administered 2014-03-05 – 2014-03-06 (×3): 15 mL via OROMUCOSAL
  Filled 2014-03-05 (×5): qty 15

## 2014-03-05 MED ORDER — BIOTENE DRY MOUTH MT LIQD
15.0000 mL | Freq: Two times a day (BID) | OROMUCOSAL | Status: DC
Start: 2014-03-05 — End: 2014-03-06
  Administered 2014-03-05: 15 mL via OROMUCOSAL

## 2014-03-05 NOTE — Progress Notes (Addendum)
INITIAL NUTRITION ASSESSMENT  DOCUMENTATION CODES Per approved criteria  -Non-severe (moderate) malnutrition in the context of social or environmental circumstances   INTERVENTION: Add Ensure Complete po BID, each supplement provides 350 kcal and 13 grams of protein, vanilla flavor. Continue MVI. Further diet advancement per MD discretion. RD to continue to follow nutrition care plan.  NUTRITION DIAGNOSIS: Inadequate oral intake related to alcohol abuse as evidenced by patient report and moderate fat/muscle mass loss.   Goal: Intake to meet >90% of estimated nutrition needs.  Monitor:  weight trends, lab trends, I/O's, PO intake, supplement tolerance  Reason for Assessment: MD Consult for Nutrition Assessment  58 y.o. female  Admitting Dx: Seizure  ASSESSMENT: PMHx significant for asthma, seizure, and ETOH abuse. Admitted s/p seizure. Pt has been drinking heavily since June 1st per H&P.  Patient was advanced to a Regular diet this morning, however she is now downgraded to a Full Liquid diet. Discussed with RN. Per RN, pt vomited after lunch and he states that she only ate a few bites. Patient denies this and states that she vomited after dinner last night but not at all today. She is somewhat slow at answering my questions. She reports that back in the 1990's she weighed up to 330 lb and states that she has lost her weight slowly since then. She tells me that she has a variable appetite and doesn't know why. She states that sometimes she eats and sometimes she doesn't.  Pt unable to provide any additional information.  Discussed need for increased intake, pt agreeable to vanilla Ensure and states that she suspects she will be able to tolerate it well.  Discussed patient during progression rounds this morning.  Nutrition Focused Physical Exam:  Subcutaneous Fat:  Orbital Region: mild depletion Upper Arm Region: moderate depletion Thoracic and Lumbar Region: moderate  depletion  Muscle:  Temple Region: moderate depletion Clavicle Bone Region: moderate depletion Clavicle and Acromion Bone Region: moderate depletion Scapular Bone Region: n/a Dorsal Hand: WNL Patellar Region: WNL Anterior Thigh Region: moderate depletion Posterior Calf Region: n/a  Edema: none  Pt meets criteria for moderate MALNUTRITION in the context of social/environmental circumstances as evidenced by intake of <75% x at least 3 months and moderate fat and muscle mass depletion.  Currently on CIWA protocol; also receiving folic acid, thiamine and MVI.  Potassium is low at 3.3 --> ordered for KCl Magnesium WNL --> ordered for IV magnesium sulfate Phosphorus WNL   Height: Ht Readings from Last 1 Encounters:  03/04/14 5\' 3"  (1.6 m)    Weight: Wt Readings from Last 1 Encounters:  03/04/14 142 lb 12.8 oz (64.774 kg)    Ideal Body Weight: 115 lb  % Ideal Body Weight: 123%  Wt Readings from Last 10 Encounters:  03/04/14 142 lb 12.8 oz (64.774 kg)  11/06/13 136 lb 3.9 oz (61.8 kg)    Usual Body Weight: n/a  % Usual Body Weight: n/a  BMI:  Body mass index is 25.3 kg/(m^2). Overweight  Estimated Nutritional Needs: Kcal: 1650 - 1800 kcal Protein: 78 - 85 grams Fluid: 1.8 - 2 liters  Skin: intact  Diet Order: Full Liquid  EDUCATION NEEDS: -No education needs identified at this time   Intake/Output Summary (Last 24 hours) at 03/05/14 1546 Last data filed at 03/05/14 1300  Gross per 24 hour  Intake   2240 ml  Output      0 ml  Net   2240 ml    Last BM: 6/8  Labs:  Recent Labs Lab 03/04/14 1201 03/04/14 2050 03/05/14 0135  NA 147  --  142  K 4.1  --  3.3*  CL 106  --  106  CO2 17*  --  22  BUN 13  --  8  CREATININE 0.73  --  0.77  CALCIUM 9.5  --  8.8  MG  --  1.5  --   PHOS  --  2.5  --   GLUCOSE 110*  --  88    CBG (last 3)  No results found for this basename: GLUCAP,  in the last 72 hours  Scheduled Meds: . antiseptic oral rinse   15 mL Mouth Rinse q12n4p  . chlorhexidine  15 mL Mouth Rinse BID  . divalproex  500 mg Oral Q12H  . folic acid  1 mg Oral Daily  . levETIRAcetam  750 mg Oral Q12H  . LORazepam  0-4 mg Intravenous 4 times per day  . LORazepam  0-4 mg Intravenous Q12H  . magnesium chloride  2 tablet Oral Daily  . multivitamin with minerals  1 tablet Oral Daily  . ondansetron  4 mg Oral TID AC  . [START ON 03/06/2014] pneumococcal 23 valent vaccine  0.5 mL Intramuscular Tomorrow-1000  . potassium chloride  40 mEq Oral BID  . sodium chloride  3 mL Intravenous Q12H  . thiamine  100 mg Oral Daily    Continuous Infusions:   Past Medical History  Diagnosis Date  . Seizures   . Asthma     History reviewed. No pertinent past surgical history.  Inda Coke MS, RD, LDN Inpatient Registered Dietitian Pager: [phone] After-hours pager: [phone]

## 2014-03-05 NOTE — Progress Notes (Signed)
Utilization review completed.  

## 2014-03-05 NOTE — Progress Notes (Addendum)
PROGRESS NOTE  Lindsey [name] FKC:[id number] DOB: April 09, 1956 DOA: 03/04/2014 PCP: [name], MD  HPI/Subjective: Confused 58 y.o. female with h/o seizures brought to ED yesterday by family 4 episodes of witnessed seizures  and mild head trauma.  Reports noncompliance with Depakote and Keppra Valproic Acid lvls below therapeutic ref range Admits to alcohol abuse Marijuana use ? Reports of diarrhea in ED.   Exhausted and confused about last night's events.  ROS: Denies headache and tingling sensations. Reports vomiting x 2 last night.   Assessment/Plan: Principal Problem:  Seizure - h/o seizure disorder, medication noncompliance, alcohol abuse - head CT negative for hemorrhage - elevated Lactic Acid, now resolved - alert and oriented today - c/w Keppra and Depakote  Active Problems: Encephalopathy -secondary to post-ictal state -CT head negative -completely resolved today-await and alert  Alcohol Abuse and Marijuana Abuse - Strongly counseled  - c/w Thiamine and Folic Acid supplements  Low TSH -stable for outpatient f/u  Protein-calorie Malnutrition, severe - B12 pending - Nutrition consulted  Elevated Temperature - Likely due to seizures - Blood cultures pending, preliminary result no growth - C-Diff PCR ordered - Resolved overnight - Discontinue Levaquin  Hypokalemia - repleted with oral Potassium and IV Magnesium - repeat BMet 06/10  Resolved: Mild Lactic Acidosis due to dehydration and seizure - IVFs overnight, levels in ref range.   DVT Prophylaxis: Heparin  Code Status: Full  Family Communication: None at bedside  Disposition Plan: Inpatient  Consultants:  None  Procedures:  None  Antibiotics:  Received one dose of Levaquin, discontinued.   Objective: Filed Vitals:   03/04/14 2230 03/04/14 2341 03/05/14 0040 03/05/14 0508  BP:  155/95  131/84  Pulse:  122  106  Temp: 100.2 F (37.9 C) 101.1 F (38.4 C) 99.1 F (37.3  C) 97.5 F (36.4 C)  TempSrc: Axillary Rectal Oral Oral  Resp:  20  18  Height:      Weight:      SpO2:  95%  100%    Intake/Output Summary (Last 24 hours) at 03/05/14 1151 Last data filed at 03/05/14 0900  Gross per 24 hour  Intake   2000 ml  Output      0 ml  Net   2000 ml   Filed Weights   03/04/14 1954  Weight: 64.774 kg (142 lb 12.8 oz)    Exam: General: Well developed, NAD, appears stated age. Mildly confused during history.  HEENT:  PERR, EOMI, Anicteic Sclera.  Neck: Supple, no JVD, no masses  Cardiovascular: RRR, S1 S2 auscultated, no rubs, murmurs or gallops.   Respiratory: Clear to auscultation bilaterally anterior to posterior with equal chest rise. Normal respiratory effort.  Abdomen: Soft, nontender, nondistended, BS + Extremities: warm dry without cyanosis clubbing or edema.  Neuro: AAOx3, cranial nerves grossly intact. Strength 5/5 in upper and lower extremities. Finger to nose coordination is smooth.   Skin: Without rashes exudates or nodules. 12" oval hypopigmented skin on right anterior-lateral thigh.   Psych: Normal affect and demeanor with intact judgement and insight. Mildly confused about last night's events.    Data Reviewed: Basic Metabolic Panel:  Recent Labs Lab 03/04/14 1201 03/04/14 2050 03/05/14 0135  NA 147  --  142  K 4.1  --  3.3*  CL 106  --  106  CO2 17*  --  22  GLUCOSE 110*  --  88  BUN 13  --  8  CREATININE 0.73  --  0.77  CALCIUM 9.5  --  8.8  MG  --  1.5  --   PHOS  --  2.5  --    Liver Function Tests:  Recent Labs Lab 03/04/14 1201 03/05/14 0135  AST 37 24  ALT 16 11  ALKPHOS 107 82  BILITOT 0.3 0.8  PROT 8.0 6.4  ALBUMIN 4.0 3.1*   CBC:  Recent Labs Lab 03/04/14 1201 03/05/14 0135  WBC 8.7 8.8  NEUTROABS 7.3  --   HGB 15.3* 12.3  HCT 44.5 35.7*  MCV 92.3 89.3  PLT 310 277    Recent Results (from the past 240 hour(s))  CULTURE, BLOOD (ROUTINE X 2)     Status: None   Collection Time    03/04/14   5:10 PM      Result Value Ref Range Status   Specimen Description BLOOD ARM LEFT   Final   Special Requests BOTTLES DRAWN AEROBIC AND ANAEROBIC B 10CC R 5CC   Final   Culture  Setup Time     Final   Value: 03/04/2014 22:47     Performed at [facility]   Culture     Final   Value:        BLOOD CULTURE RECEIVED NO GROWTH TO DATE CULTURE WILL BE HELD FOR 5 DAYS BEFORE ISSUING A FINAL NEGATIVE REPORT     Performed at [facility]   Report Status PENDING   Incomplete  CULTURE, BLOOD (ROUTINE X 2)     Status: None   Collection Time    03/04/14  5:15 PM      Result Value Ref Range Status   Specimen Description BLOOD HAND LEFT   Final   Special Requests BOTTLES DRAWN AEROBIC ONLY 10CC   Final   Culture  Setup Time     Final   Value: 03/04/2014 22:46     Performed at [facility]   Culture     Final   Value:        BLOOD CULTURE RECEIVED NO GROWTH TO DATE CULTURE WILL BE HELD FOR 5 DAYS BEFORE ISSUING A FINAL NEGATIVE REPORT     Performed at [facility]   Report Status PENDING   Incomplete     Studies: Dg Chest 2 View  03/04/2014   CLINICAL DATA:  Altered mental status. Nausea vomiting and seizures. Asthma.  EXAM: CHEST  2 VIEW  COMPARISON:  11/05/2013  FINDINGS: Cardiac leads project over the chest. Stable heart and mediastinal contours. Pulmonary vascularity is normal. Lung volumes are low normal and the lungs are clear. Negative for pleural effusion or pneumothorax. Probable remote healed fracture deformity of the proximal right humerus, unchanged. No acute bony abnormality identified.  IMPRESSION: No acute cardiopulmonary disease.   Electronically Signed   By: Curlene Dolphin M.D.   On: 03/04/2014 12:48   Ct Head Wo Contrast  03/04/2014   CLINICAL DATA:  Seizures with recent fall  EXAM: CT HEAD WITHOUT CONTRAST  TECHNIQUE: Contiguous axial images were obtained from the base of the skull through the vertex without intravenous contrast.  COMPARISON:  11/05/2013   FINDINGS: The bony calvarium is intact. The ventricles and cortical sulci are mildly prominent consistent with mild cortical atrophy. This is stable from the prior exam. No findings to suggest acute hemorrhage, acute infarction or space-occupying mass lesion are noted.  IMPRESSION: Chronic changes without acute abnormality.   Electronically Signed   By: Inez Catalina M.D.   On: 03/04/2014 13:03    Scheduled Meds: .  antiseptic oral rinse  15 mL Mouth Rinse q12n4p  . chlorhexidine  15 mL Mouth Rinse BID  . folic acid  1 mg Oral Daily  . heparin  5,000 Units Subcutaneous 3 times per day  . levETIRAcetam  750 mg Intravenous Q12H  . LORazepam  0-4 mg Intravenous 4 times per day  . LORazepam  0-4 mg Intravenous Q12H  . multivitamin with minerals  1 tablet Oral Daily  . [START ON 03/06/2014] pneumococcal 23 valent vaccine  0.5 mL Intramuscular Tomorrow-1000  . potassium chloride  40 mEq Oral BID  . sodium chloride  3 mL Intravenous Q12H  . thiamine  100 mg Oral Daily   Or  . thiamine  100 mg Intravenous Daily  . valproate sodium  500 mg Intravenous Q12H   Continuous Infusions: . sodium chloride 100 mL/hr at 03/04/14 2332    Principal Problem:   Seizure Active Problems:   Alcohol abuse   Marijuana abuse   Protein-calorie malnutrition, severe   Encephalopathy   Fever   Signed, Martinique Hausladen, PA-S Imogene Burn, [place]  [facility] Pager [phone]. If 7PM-7AM, please contact night-coverage at [email or web address], password [facility] 03/05/2014, 11:51 AM  LOS: 1 day    Attending Patient was seen, examined,treatment plan was discussed with the Physician extender. I have directly reviewed the clinical findings, lab, imaging studies and management of this patient in detail. I have made the necessary changes to the above noted documentation, and agree with the documentation, as recorded by the Physician extender.  Nena Alexander MD [facility].

## 2014-03-06 LAB — POTASSIUM: POTASSIUM: 4.1 meq/L (ref 3.7–5.3)

## 2014-03-06 MED ORDER — LEVETIRACETAM 750 MG PO TABS: 750.0000 mg | ORAL_TABLET | Freq: Two times a day (BID) | ORAL | Status: DC

## 2014-03-06 MED ORDER — ENSURE COMPLETE PO LIQD: 237.0000 mL | Freq: Two times a day (BID) | ORAL | Status: DC

## 2014-03-06 MED ORDER — THIAMINE HCL 100 MG PO TABS: 100.0000 mg | ORAL_TABLET | Freq: Every day | ORAL | Status: DC

## 2014-03-06 MED ORDER — FOLIC ACID 1 MG PO TABS: 1.0000 mg | ORAL_TABLET | Freq: Every day | ORAL | Status: DC

## 2014-03-06 MED ORDER — DIVALPROEX SODIUM 500 MG PO DR TAB: 500.0000 mg | DELAYED_RELEASE_TABLET | Freq: Two times a day (BID) | ORAL | Status: DC

## 2014-03-06 NOTE — Discharge Instructions (Signed)
Please take your seizure medications and do not drink alcohol. THANKS!!!

## 2014-03-06 NOTE — ED Provider Notes (Signed)
Medical screening examination/treatment/procedure(s) were conducted as a shared visit with non-physician practitioner(s) and myself.  I personally evaluated the patient during the encounter.   EKG Interpretation   Date/Time:  Monday March 04 2014 11:14:13 EDT Ventricular Rate:  99 PR Interval:  143 QRS Duration: 88 QT Interval:  372 QTC Calculation: 477 R Axis:   90 Text Interpretation:  Sinus rhythm Borderline right axis deviation  Consider left ventricular hypertrophy Anterior Q waves, possibly due to  LVH No significant change since last tracing Confirmed by Zenia Resides  MD,  Rylon Poitra ([id number]) on 03/04/2014 4:36:44 PM       Leota Jacobsen, MD 03/06/14 1133

## 2014-03-06 NOTE — Progress Notes (Signed)
Patient discharge teaching given, including activity, diet, follow-up appoints, and medications. Patient verbalized understanding of all discharge instructions. IV access was d/c'd. Vitals are stable. Skin is intact except as charted in most recent assessments. Pt to be escorted out by NT, to be driven home by family. 

## 2014-03-06 NOTE — Discharge Summary (Signed)
Physician Discharge Summary  Lindsey [name] WNU:[id number] DOB: [birth year] DOA: 03/04/2014  PCP: [name], MD  Admit date: 03/04/2014 Discharge date: 03/06/2014  Time spent: 45 minutes  Recommendations for Outpatient Follow-up:  1. Check Thyroid Panel, depakote level, and  BMET in 1-2  weeks 2. Patient reported she would no longer drink and understands she has to take her seizure medications. 3. Check final blood culture results.  Discharge Diagnoses:  Principal Problem:   Seizure Active Problems:   Alcohol abuse   Marijuana abuse   Protein-calorie malnutrition, severe   Encephalopathy   Fever   Discharge Condition: stable.  Diet recommendation: heart healthy  Filed Weights   03/04/14 1954  Weight: 64.774 kg (142 lb 12.8 oz)    History of present illness:  Lindsey [name] is a 58 y.o. female with pmh significant for asthma, seizure and alcohol abuse; who came to ED after 4 episodes of witnessed seizure. Per family reports to ED physician, patient has not been compliant with medications and has been drinking significantly since June the 1st. There is also reports of some ongoing diarrhea; but no fever, nausea or vomiting at home. In ED patient is obtunded/post ictal and her antiepileptic levels are subtherapeutic.  While in ED she was also found to be febrile.    Hospital Course:  Seizure  - Received bolus doses of anti-seizure medications in the ED. - h/o seizure disorder, medication noncompliance, alcohol abuse  - head CT negative for hemorrhage  - elevated Lactic Acid, now resolved with IVF and no further seizures. - alert and oriented today  - c/w Keppra and Depakote   Active Problems:  Encephalopathy  -secondary to post-ictal state  -CT head negative  -completely resolved   Alcohol Abuse and Marijuana Abuse  - Strongly counseled  - c/w Thiamine and Folic Acid supplements   Low TSH  -stable for outpatient f/u   Protein-calorie Malnutrition,  moderate - B12 normal - Nutrition consulted recommended ensure complete BID  Elevated Temperature  - Likely due to seizures  - Blood cultures pending, No growth to date at the time of discharge. - C-Diff PCR negative. - Resolved overnight without recurrance - Discontinued Levaquin 6/9  Hypokalemia  - repleted with oral Potassium and IV Magnesium  - repeat BMet 06/10   Mild Lactic Acidosis due to dehydration and seizure  - IVFs overnight, levels in ref range.        Discharge Exam: Filed Vitals:   03/06/14 0609  BP: 120/70  Pulse: 84  Temp: 98.4 F (36.9 C)  Resp: 20    General: Wd, thin, AA female, in jovial mood, sitting on the side of the bed.  Husband at bedside. Cardiovascular: rrr no m/r/g Respiratory: cta no w/c/r Abdomen:  Soft, nt, nd, +Bs, no masses Extremities:  No C/C/E able to walk about the room with no difficulty.  Discharge Instructions       Discharge Instructions   Diet - low sodium heart healthy    Complete by:  As directed      Increase activity slowly    Complete by:  As directed             Medication List         acetaminophen 500 MG tablet  Commonly known as:  TYLENOL  Take 500 mg by mouth every 6 (six) hours as needed for moderate pain.     albuterol 108 (90 BASE) MCG/ACT inhaler  Commonly known as:  PROVENTIL HFA;VENTOLIN HFA  Inhale  2 puffs into the lungs every 6 (six) hours as needed for wheezing or shortness of breath.     cholecalciferol 1000 UNITS tablet  Commonly known as:  VITAMIN D  Take 1,000 Units by mouth daily.     divalproex 500 MG DR tablet  Commonly known as:  DEPAKOTE  Take 1 tablet (500 mg total) by mouth every 12 (twelve) hours.     feeding supplement (ENSURE COMPLETE) Liqd  Take 237 mLs by mouth 2 (two) times daily between meals.     folic acid 1 MG tablet  Commonly known as:  FOLVITE  Take 1 tablet (1 mg total) by mouth daily.     hydrOXYzine 25 MG tablet  Commonly known as:  ATARAX/VISTARIL   Take 1 tablet (25 mg total) by mouth every 6 (six) hours as needed for itching.     levETIRAcetam 750 MG tablet  Commonly known as:  KEPPRA  Take 1 tablet (750 mg total) by mouth 2 (two) times daily.     multivitamin with minerals Tabs tablet  Take 1 tablet by mouth daily.     naproxen sodium 220 MG tablet  Commonly known as:  ANAPROX  Take 440 mg by mouth 2 (two) times daily as needed (pain).     thiamine 100 MG tablet  Take 1 tablet (100 mg total) by mouth daily.       Allergies  Allergen Reactions  . Morphine And Related   . Penicillins Nausea Only   Follow-up Information   Follow up with [name], MD In 1 week.   Specialty:  Family Medicine   Contact information:   [place] [address] [phone]        The results of significant diagnostics from this hospitalization (including imaging, microbiology, ancillary and laboratory) are listed below for reference.    Significant Diagnostic Studies: Dg Chest 2 View  03/04/2014   CLINICAL DATA:  Altered mental status. Nausea vomiting and seizures. Asthma.  EXAM: CHEST  2 VIEW  COMPARISON:  11/05/2013  FINDINGS: Cardiac leads project over the chest. Stable heart and mediastinal contours. Pulmonary vascularity is normal. Lung volumes are low normal and the lungs are clear. Negative for pleural effusion or pneumothorax. Probable remote healed fracture deformity of the proximal right humerus, unchanged. No acute bony abnormality identified.  IMPRESSION: No acute cardiopulmonary disease.   Electronically Signed   By: Curlene Dolphin M.D.   On: 03/04/2014 12:48   Ct Head Wo Contrast  03/04/2014   CLINICAL DATA:  Seizures with recent fall  EXAM: CT HEAD WITHOUT CONTRAST  TECHNIQUE: Contiguous axial images were obtained from the base of the skull through the vertex without intravenous contrast.  COMPARISON:  11/05/2013  FINDINGS: The bony calvarium is intact. The ventricles and cortical sulci are mildly prominent  consistent with mild cortical atrophy. This is stable from the prior exam. No findings to suggest acute hemorrhage, acute infarction or space-occupying mass lesion are noted.  IMPRESSION: Chronic changes without acute abnormality.   Electronically Signed   By: Inez Catalina M.D.   On: 03/04/2014 13:03    Microbiology: Recent Results (from the past 240 hour(s))  CULTURE, BLOOD (ROUTINE X 2)     Status: None   Collection Time    03/04/14  5:10 PM      Result Value Ref Range Status   Specimen Description BLOOD ARM LEFT   Final   Special Requests BOTTLES DRAWN AEROBIC AND ANAEROBIC B 10CC R 5CC  Final   Culture  Setup Time     Final   Value: 03/04/2014 22:47     Performed at [facility]   Culture     Final   Value:        BLOOD CULTURE RECEIVED NO GROWTH TO DATE CULTURE WILL BE HELD FOR 5 DAYS BEFORE ISSUING A FINAL NEGATIVE REPORT     Performed at [facility]   Report Status PENDING   Incomplete  CULTURE, BLOOD (ROUTINE X 2)     Status: None   Collection Time    03/04/14  5:15 PM      Result Value Ref Range Status   Specimen Description BLOOD HAND LEFT   Final   Special Requests BOTTLES DRAWN AEROBIC ONLY 10CC   Final   Culture  Setup Time     Final   Value: 03/04/2014 22:46     Performed at [facility]   Culture     Final   Value:        BLOOD CULTURE RECEIVED NO GROWTH TO DATE CULTURE WILL BE HELD FOR 5 DAYS BEFORE ISSUING A FINAL NEGATIVE REPORT     Performed at [facility]   Report Status PENDING   Incomplete  CLOSTRIDIUM DIFFICILE BY PCR     Status: None   Collection Time    03/05/14  3:37 PM      Result Value Ref Range Status   C difficile by pcr NEGATIVE  NEGATIVE Final     Labs: Basic Metabolic Panel:  Recent Labs Lab 03/04/14 1201 03/04/14 2050 03/05/14 0135 03/06/14 0834  NA 147  --  142  --   K 4.1  --  3.3* 4.1  CL 106  --  106  --   CO2 17*  --  22  --   GLUCOSE 110*  --  88  --   BUN 13  --  8  --   CREATININE 0.73   --  0.77  --   CALCIUM 9.5  --  8.8  --   MG  --  1.5  --   --   PHOS  --  2.5  --   --    Liver Function Tests:  Recent Labs Lab 03/04/14 1201 03/05/14 0135  AST 37 24  ALT 16 11  ALKPHOS 107 82  BILITOT 0.3 0.8  PROT 8.0 6.4  ALBUMIN 4.0 3.1*   CBC:  Recent Labs Lab 03/04/14 1201 03/05/14 0135  WBC 8.7 8.8  NEUTROABS 7.3  --   HGB 15.3* 12.3  HCT 44.5 35.7*  MCV 92.3 89.3  PLT 310 277    SignedKaren Kitchens [phone]  [facility] 03/06/2014, 1:49 PM

## 2014-03-06 NOTE — Clinical Social Work Note (Signed)
CSW assisted patient with transportation and clothing. CSW signing off.    Liz Beach MSW, [place], [place], [phone]

## 2014-03-06 NOTE — Discharge Summary (Signed)
  I have directly reviewed the clinical findings, lab, imaging studies and management of this patient in detail. I have interviewed and examined the patient and agree with the documentation,  as recorded by the Physician extender.  Thurnell Lose M.D on 03/06/2014 at [facility] Office  [phone]

## 2014-03-10 LAB — CULTURE, BLOOD (ROUTINE X 2)
CULTURE: NO GROWTH
Culture: NO GROWTH

## 2014-08-16 ENCOUNTER — Encounter (HOSPITAL_COMMUNITY): Payer: Self-pay | Admitting: Emergency Medicine

## 2014-08-16 ENCOUNTER — Emergency Department (HOSPITAL_COMMUNITY)
Admission: EM | Admit: 2014-08-16 | Discharge: 2014-08-18 | Disposition: A | Discharge status: Home / Self Care | Payer: No Typology Code available for payment source | Attending: Emergency Medicine | Admitting: Emergency Medicine

## 2014-08-16 DIAGNOSIS — Z79899 Other long term (current) drug therapy: Secondary | ICD-10-CM | POA: Diagnosis not present

## 2014-08-16 DIAGNOSIS — R4182 Altered mental status, unspecified: Secondary | ICD-10-CM | POA: Diagnosis present

## 2014-08-16 DIAGNOSIS — Z880 Allergy status to penicillin: Secondary | ICD-10-CM | POA: Insufficient documentation

## 2014-08-16 DIAGNOSIS — R569 Unspecified convulsions: Secondary | ICD-10-CM

## 2014-08-16 DIAGNOSIS — J45909 Unspecified asthma, uncomplicated: Secondary | ICD-10-CM | POA: Diagnosis not present

## 2014-08-16 DIAGNOSIS — G40909 Epilepsy, unspecified, not intractable, without status epilepticus: Secondary | ICD-10-CM | POA: Diagnosis not present

## 2014-08-16 DIAGNOSIS — Z791 Long term (current) use of non-steroidal anti-inflammatories (NSAID): Secondary | ICD-10-CM | POA: Diagnosis not present

## 2014-08-16 DIAGNOSIS — F10230 Alcohol dependence with withdrawal, uncomplicated: Secondary | ICD-10-CM | POA: Diagnosis not present

## 2014-08-16 DIAGNOSIS — F202 Catatonic schizophrenia: Secondary | ICD-10-CM | POA: Diagnosis not present

## 2014-08-16 DIAGNOSIS — F10930 Alcohol use, unspecified with withdrawal, uncomplicated: Secondary | ICD-10-CM

## 2014-08-16 DIAGNOSIS — F10939 Alcohol use, unspecified with withdrawal, unspecified: Secondary | ICD-10-CM | POA: Diagnosis present

## 2014-08-16 DIAGNOSIS — F1011 Alcohol abuse, in remission: Secondary | ICD-10-CM | POA: Diagnosis present

## 2014-08-16 DIAGNOSIS — F10239 Alcohol dependence with withdrawal, unspecified: Secondary | ICD-10-CM | POA: Diagnosis present

## 2014-08-16 DIAGNOSIS — F1010 Alcohol abuse, uncomplicated: Secondary | ICD-10-CM | POA: Diagnosis present

## 2014-08-16 LAB — URINALYSIS, ROUTINE W REFLEX MICROSCOPIC
Bilirubin Urine: NEGATIVE
Glucose, UA: NEGATIVE mg/dL
KETONES UR: NEGATIVE mg/dL
Leukocytes, UA: NEGATIVE
NITRITE: NEGATIVE
Protein, ur: 30 mg/dL — AB
Specific Gravity, Urine: 1.016 (ref 1.005–1.030)
UROBILINOGEN UA: 1 mg/dL (ref 0.0–1.0)
pH: 6.5 (ref 5.0–8.0)

## 2014-08-16 LAB — URINE MICROSCOPIC-ADD ON

## 2014-08-16 LAB — CBC
HEMATOCRIT: 41.5 % (ref 36.0–46.0)
Hemoglobin: 14.4 g/dL (ref 12.0–15.0)
MCH: 31.2 pg (ref 26.0–34.0)
MCHC: 34.7 g/dL (ref 30.0–36.0)
MCV: 89.8 fL (ref 78.0–100.0)
Platelets: 412 10*3/uL — ABNORMAL HIGH (ref 150–400)
RBC: 4.62 MIL/uL (ref 3.87–5.11)
RDW: 13.2 % (ref 11.5–15.5)
WBC: 12.7 10*3/uL — ABNORMAL HIGH (ref 4.0–10.5)

## 2014-08-16 LAB — COMPREHENSIVE METABOLIC PANEL
ALK PHOS: 108 U/L (ref 39–117)
ALT: 11 U/L (ref 0–35)
AST: 22 U/L (ref 0–37)
Albumin: 4.1 g/dL (ref 3.5–5.2)
Anion gap: 13 (ref 5–15)
BILIRUBIN TOTAL: 1.3 mg/dL — AB (ref 0.3–1.2)
BUN: 12 mg/dL (ref 6–23)
CO2: 26 meq/L (ref 19–32)
CREATININE: 0.81 mg/dL (ref 0.50–1.10)
Calcium: 10.2 mg/dL (ref 8.4–10.5)
Chloride: 96 mEq/L (ref 96–112)
GFR calc Af Amer: >90 mL/min (ref >90)
GFR, EST NON AFRICAN AMERICAN: 79 mL/min — AB (ref >90)
GLUCOSE: 95 mg/dL (ref 70–99)
Potassium: 4 mEq/L (ref 3.7–5.3)
Sodium: 135 mEq/L — ABNORMAL LOW (ref 137–147)
Total Protein: 8.2 g/dL (ref 6.0–8.3)

## 2014-08-16 LAB — ETHANOL

## 2014-08-16 LAB — RAPID URINE DRUG SCREEN, HOSP PERFORMED
Amphetamines: NOT DETECTED
BARBITURATES: NOT DETECTED
BENZODIAZEPINES: NOT DETECTED
Cocaine: NOT DETECTED
Opiates: NOT DETECTED
Tetrahydrocannabinol: NOT DETECTED

## 2014-08-16 LAB — TROPONIN I

## 2014-08-16 LAB — VALPROIC ACID LEVEL: Valproic Acid Lvl: <10 ug/mL — ABNORMAL LOW (ref 50.0–100.0)

## 2014-08-16 MED ORDER — LEVETIRACETAM IN NACL 1000 MG/100ML IV SOLN
1000.0000 mg | Freq: Once | INTRAVENOUS | Status: AC
  Administered 2014-08-16: 1000 mg via INTRAVENOUS
  Filled 2014-08-16: qty 100

## 2014-08-16 MED ORDER — THIAMINE HCL 100 MG/ML IJ SOLN
100.0000 mg | Freq: Every day | INTRAMUSCULAR | Status: DC
  Administered 2014-08-16: 100 mg via INTRAVENOUS
  Filled 2014-08-16: qty 2

## 2014-08-16 MED ORDER — ONDANSETRON HCL 4 MG PO TABS: 4.0000 mg | ORAL_TABLET | Freq: Three times a day (TID) | ORAL | Status: DC | PRN

## 2014-08-16 MED ORDER — LORAZEPAM 2 MG/ML IJ SOLN
2.0000 mg | Freq: Once | INTRAMUSCULAR | Status: AC
Start: 2014-08-16 — End: 2014-08-16
  Administered 2014-08-16: 2 mg via INTRAMUSCULAR
  Filled 2014-08-16: qty 1

## 2014-08-16 MED ORDER — LORAZEPAM 2 MG/ML IJ SOLN: 1.0000 mg | Freq: Once | INTRAMUSCULAR | Status: DC

## 2014-08-16 MED ORDER — ACETAMINOPHEN 325 MG PO TABS
650.0000 mg | ORAL_TABLET | ORAL | Status: DC | PRN
  Administered 2014-08-17 – 2014-08-18 (×3): 650 mg via ORAL
  Filled 2014-08-16 (×3): qty 2

## 2014-08-16 MED ORDER — LORAZEPAM 1 MG PO TABS
1.0000 mg | ORAL_TABLET | Freq: Three times a day (TID) | ORAL | Status: DC | PRN
  Administered 2014-08-18: 1 mg via ORAL

## 2014-08-16 MED ORDER — LORAZEPAM 1 MG PO TABS: 0.0000 mg | ORAL_TABLET | Freq: Two times a day (BID) | ORAL | Status: DC

## 2014-08-16 MED ORDER — NICOTINE 21 MG/24HR TD PT24
21.0000 mg | MEDICATED_PATCH | Freq: Every day | TRANSDERMAL | Status: DC
  Administered 2014-08-17: 21 mg via TRANSDERMAL
  Filled 2014-08-16: qty 1

## 2014-08-16 MED ORDER — ZOLPIDEM TARTRATE 5 MG PO TABS
5.0000 mg | ORAL_TABLET | Freq: Every evening | ORAL | Status: DC | PRN
  Administered 2014-08-17: 5 mg via ORAL
  Filled 2014-08-16: qty 1

## 2014-08-16 MED ORDER — VITAMIN B-1 100 MG PO TABS
100.0000 mg | ORAL_TABLET | Freq: Every day | ORAL | Status: DC
  Administered 2014-08-17 – 2014-08-18 (×2): 100 mg via ORAL
  Filled 2014-08-16 (×2): qty 1

## 2014-08-16 MED ORDER — LORAZEPAM 1 MG PO TABS
0.0000 mg | ORAL_TABLET | Freq: Four times a day (QID) | ORAL | Status: DC
Start: 2014-08-16 — End: 2014-08-18
  Administered 2014-08-18: 1 mg via ORAL
  Filled 2014-08-16 (×2): qty 1

## 2014-08-16 NOTE — ED Notes (Signed)
Per Agricultural consultant, pt unable to go back to TCU at this time until pts are d/c'd. Pt is not suitable for psych ED at this time

## 2014-08-16 NOTE — ED Notes (Signed)
Patient walked to bathroom without any problems. Patient is oriented to person, place, and time.

## 2014-08-16 NOTE — ED Notes (Signed)
Pt unhooked herself from Heart Monitor and started wondering hall looking for restroom. Assisted pt to restroom and notified her we needed a urine sample. Placed cap t in commode. Pt voided both Urine and BM in cap. Ambulated pt back to room and hooked back up to Monitor and provided warm blankets

## 2014-08-16 NOTE — ED Notes (Signed)
Spoke with dr Ashok Cordia about Urine. He said we could wait on I/O. Pt is resting, will ask pt if she can void when she wakes up

## 2014-08-16 NOTE — BH Assessment (Signed)
Triage Specialist attempted to complete behavioral health assessment at 8:45pm but pt would not respond. Informed attending nurse. Will attempt assessment at a later time this evening.   Ramond Dial, [facility] Triage Specialist

## 2014-08-16 NOTE — ED Notes (Signed)
Bed: WA08 Expected date: 08/16/14 Expected time: 1:20 PM Means of arrival:  Comments: Ems seizure pt

## 2014-08-16 NOTE — ED Notes (Addendum)
Pt verbally abusive and combative when trying to assess pt sts,"Leave me alone mother fuckers." Will draw labs and gain IV access when pt has calmed from meds per Dr. Kathrynn Humble

## 2014-08-16 NOTE — ED Notes (Addendum)
Pt daughter called for status on her mother, was advised that pt is doing well. Pt daughter wants to be contacted with any change in status or new info [place] [phone] Clementeen Hoof [phone]

## 2014-08-16 NOTE — ED Provider Notes (Signed)
CSN: [id number]     Arrival date & time 08/16/14  1337 History   First MD Initiated Contact with Patient 08/16/14 1350     Chief Complaint  Patient presents with  . Seizures  . Altered Mental Status     (Consider location/radiation/quality/duration/timing/severity/associated sxs/prior Treatment) HPI Comments: Pt comes in with cc of seizures. She has hx of TBI, ETOH abuse, seizures and bipolar disorder and schizophrenia. LEVEL 5 CAVEAT - FOR PSYCH DISORDER. Pt allegedly had a witnessed seizure. She arrives to the ER with no seizures, but agitated, and wanting to be left alone, not cooperating with history or exam. Does tell me her name and location, but will otherwise answer in yes or no form and is verbally and physically aggressive to our staff that is trying to help her. Appears to have med non compliance issues.  Patient is a 58 y.o. female presenting with seizures and altered mental status. The history is provided by the patient.  Seizures Altered Mental Status Associated symptoms: seizures     Past Medical History  Diagnosis Date  . Seizures   . Asthma    History reviewed. No pertinent past surgical history. History reviewed. No pertinent family history. History  Substance Use Topics  . Smoking status: Never Smoker   . Smokeless tobacco: Not on file  . Alcohol Use: Yes   OB History    No data available     Review of Systems  Unable to perform ROS: Psychiatric disorder  Neurological: Positive for seizures.      Allergies  Morphine and related and Penicillins  Home Medications   Prior to Admission medications   Medication Sig Start Date End Date Taking? Authorizing Provider  acetaminophen (TYLENOL) 500 MG tablet Take 500 mg by mouth every 6 (six) hours as needed for moderate pain.    Historical Provider, MD  albuterol (PROVENTIL HFA;VENTOLIN HFA) 108 (90 BASE) MCG/ACT inhaler Inhale 2 puffs into the lungs every 6 (six) hours as needed for wheezing or shortness  of breath.    Historical Provider, MD  cholecalciferol (VITAMIN D) 1000 UNITS tablet Take 1,000 Units by mouth daily.    Historical Provider, MD  divalproex (DEPAKOTE) 500 MG DR tablet Take 1 tablet (500 mg total) by mouth every 12 (twelve) hours. 03/06/14   [name], PA-C  feeding supplement, ENSURE COMPLETE, (ENSURE COMPLETE) LIQD Take 237 mLs by mouth 2 (two) times daily between meals. 03/06/14   [name], PA-C  folic acid (FOLVITE) 1 MG tablet Take 1 tablet (1 mg total) by mouth daily. 03/06/14   [name], PA-C  hydrOXYzine (ATARAX/VISTARIL) 25 MG tablet Take 1 tablet (25 mg total) by mouth every 6 (six) hours as needed for itching. 11/09/13   Costin Karlyne Greenspan, MD  levETIRAcetam (KEPPRA) 750 MG tablet Take 1 tablet (750 mg total) by mouth 2 (two) times daily. 03/06/14   [name], PA-C  Multiple Vitamin (MULTIVITAMIN WITH MINERALS) TABS tablet Take 1 tablet by mouth daily. 11/09/13   Costin Karlyne Greenspan, MD  naproxen sodium (ANAPROX) 220 MG tablet Take 440 mg by mouth 2 (two) times daily as needed (pain).    Historical Provider, MD  thiamine 100 MG tablet Take 1 tablet (100 mg total) by mouth daily. 03/06/14   Marianne L York, PA-C   BP 167/75 mmHg  Pulse 97  Temp(Src)   Resp 24  SpO2 98% Physical Exam  Constitutional: She appears well-developed.  HENT:  Head: Normocephalic.  Eyes: Conjunctivae  are normal.  Neck: Neck supple.  Cardiovascular: Normal rate.   Pulmonary/Chest: Effort normal.  Abdominal: Soft. She exhibits no distension. There is no tenderness.  Neurological: She is alert.  Skin: Skin is warm.  Nursing note and vitals reviewed.   ED Course  Procedures (including critical care time) Labs Review Labs Reviewed  CBC - Abnormal; Notable for the following:    WBC 12.7 (*)    Platelets 412 (*)    All other components within normal limits  VALPROIC ACID LEVEL  COMPREHENSIVE METABOLIC PANEL  TROPONIN I  URINALYSIS, ROUTINE W REFLEX MICROSCOPIC  URINE  RAPID DRUG SCREEN (HOSP PERFORMED)  ETHANOL    Imaging Review No results found.   EKG Interpretation   Date/Time:  Friday August 16 2014 13:52:11 EST Ventricular Rate:  99 PR Interval:  148 QRS Duration: 90 QT Interval:  370 QTC Calculation: 475 R Axis:   42 Text Interpretation:  Sinus rhythm Right atrial enlargement Left  ventricular hypertrophy Anterior Q waves, possibly due to LVH Unchanged  EKG Confirmed by Kathrynn Humble, MD, Thelma Comp [id number]) on 08/16/2014 2:10:51 PM      MDM   Final diagnoses:  Catatonic schizophrenia  Seizure disorder   Pt comes in with cc of seizures. DDx: -Seizure disorder -Meningitis -Trauma -ICH -Electrolyte abnormality -Metabolic derangement -Stroke -Toxin induced seizures -Medication side effects -Hypoxia -Hypoglycemia  Hx of seizures, and non compliance. Should be on keppra, and depakote (depakote could be for psych related condition as well). Also has alcohol abuse. Will get basic labs, correct any reversible abnormalities. No signs of withdrawals.  I am concerned however that patient is possibly in catatonic schizophrenia, especially given med non compliance. She is not danger to self or others, and will not get IVC. Will get psych to see her and further evaluate.     Varney Biles, MD 08/16/14 [phone]

## 2014-08-16 NOTE — ED Notes (Addendum)
Attempted PIV stick x2. Patient became physically and verbally aggressive. Security at bedside. Unsuccessful with PIV attempts.

## 2014-08-16 NOTE — ED Notes (Signed)
Pt belongings place in locker 29

## 2014-08-16 NOTE — ED Notes (Addendum)
Per EMS-two seizures witnessed by housemates. Had two seizures last week, one earlier this week and two today (one earlier this morning and one at 1230). Traumatic brain injury in 1998 -seizures, bipolar, schizophrenia since TBI. Supposed to be taking Depakote-has prescription filled in [place] but unable to get medication. Lives with approximately four other people. Patient sleeps on two couch cushions. Hx tobacco use. No active seizing for EMS. Was lying on bed during seizure episode. No evidence patient hit head. Unable to answer questions appropriately. Has not been taking seizure or psych medications x2 weeks. VS: BP 166/90 HR 95 RR 18 Temp 99 F CBG 121.   Daughter [facility]) [place] (daughter) [phone]

## 2014-08-17 DIAGNOSIS — F1010 Alcohol abuse, uncomplicated: Secondary | ICD-10-CM

## 2014-08-17 DIAGNOSIS — F10239 Alcohol dependence with withdrawal, unspecified: Secondary | ICD-10-CM | POA: Diagnosis present

## 2014-08-17 MED ORDER — LEVETIRACETAM 500 MG PO TABS: 500.0000 mg | ORAL_TABLET | Freq: Two times a day (BID) | ORAL | Status: DC

## 2014-08-17 MED ORDER — LEVETIRACETAM 750 MG PO TABS: 750.0000 mg | ORAL_TABLET | Freq: Two times a day (BID) | ORAL | Status: DC

## 2014-08-17 MED ORDER — DIVALPROEX SODIUM 500 MG PO DR TAB
500.0000 mg | DELAYED_RELEASE_TABLET | Freq: Two times a day (BID) | ORAL | Status: DC
  Administered 2014-08-17 – 2014-08-18 (×2): 500 mg via ORAL
  Filled 2014-08-17 (×3): qty 1

## 2014-08-17 MED ORDER — LEVETIRACETAM 500 MG PO TABS: 500.0000 mg | ORAL_TABLET | Freq: Once | ORAL | Status: DC

## 2014-08-17 MED ORDER — FOLIC ACID 1 MG PO TABS
1.0000 mg | ORAL_TABLET | Freq: Every day | ORAL | Status: DC
  Administered 2014-08-17 – 2014-08-18 (×2): 1 mg via ORAL
  Filled 2014-08-17 (×2): qty 1

## 2014-08-17 MED ORDER — ALBUTEROL SULFATE HFA 108 (90 BASE) MCG/ACT IN AERS: 2.0000 | INHALATION_SPRAY | Freq: Four times a day (QID) | RESPIRATORY_TRACT | Status: DC | PRN

## 2014-08-17 NOTE — ED Notes (Signed)
Daughter [phone], [name]

## 2014-08-17 NOTE — ED Notes (Signed)
Pt ambulated to bathroom. Noticed unsteady gait. Encouraged patient to call for assistance.

## 2014-08-17 NOTE — Consult Note (Signed)
[facility] Consult   Reason for Consult:  Alcohol dependence Referring Physician:  EDP  Lindsey [name] is an 58 y.o. female. Total Time spent with patient: 45 minutes  Assessment: AXIS I:  Alcohol Abuse, alcohol dependence with complicated with withdrawal  AXIS II:  Deferred AXIS III:   Past Medical History  Diagnosis Date  . Seizures   . Asthma    AXIS IV:  other psychosocial or environmental problems, problems related to social environment and problems with primary support group AXIS V:  41-50 serious symptoms  Plan:  Supportive therapy provided about ongoing stressors.  Subjective:   Lindsey [name] is a 58 y.o. female patient started on Ativan detox protocol, re-evaluate in the am for stability.  HPI:  The patient was found unresponsive at home, history of seizures.  She was given fluids and IV anti-seizure medication in the ED.  Lindsey [name], pint to a fifth daily.  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  The patient denies suicidal/homicidal ideations, hallucinations, past psychiatric history or medications, and drug abuse.  Ativan alcohol detox protocol started, no signs or symptoms of withdrawal noted. HPI Elements:   Location:  generalized. Quality:  acute. Severity:  moderate. Timing:  constant. Duration:  [name]. Context:  stressors.  Past Psychiatric History: Past Medical History  Diagnosis Date  . Seizures   . Asthma     reports that she has never smoked. She does not have any smokeless tobacco history on file. She reports that she drinks alcohol. Her drug history is not on file. History reviewed. No pertinent family history. Family History Substance Abuse: Yes, Describe: (alcohol daily) Family Supports: Yes, List: (daughters) Living Arrangements: Children (adult children) Can pt return to current living arrangement?:  Yes   Allergies:   Allergies  Allergen Reactions  . Morphine And Related   . Penicillins Nausea Only    ACT Assessment Complete:  Yes:    Educational Status    Risk to Self: Risk to self with the past 6 months Suicidal Ideation: No-Not Currently/Within Last 6 Months Suicidal Intent: No-Not Currently/Within Last 6 Months Is patient at risk for suicide?: No Suicidal Plan?: No-Not Currently/Within Last 6 Months Access to Means: No What has been your use of drugs/alcohol within the last 12 months?: alcohol Previous Attempts/Gestures: No Intentional Self Injurious Behavior: None Family Suicide History: Unknown Recent stressful life event(s): Loss (Comment), Conflict (Comment) Persecutory voices/beliefs?: No Substance abuse history and/or treatment for substance abuse?: Yes (alcohol )  Risk to Others: Risk to Others within the past 6 months Homicidal Ideation: No Thoughts of Harm to Others: No-Not Currently Present/Within Last 6 Months Current Homicidal Intent: No-Not Currently/Within Last 6 Months Current Homicidal Plan: No-Not Currently/Within Last 6 Months Access to Homicidal Means: No History of harm to others?: No Assessment of Violence: None Noted Does patient have access to weapons?: No Criminal Charges Pending?: No Does patient have a court date: No  Abuse:    Prior Inpatient Therapy: Prior Inpatient Therapy Prior Inpatient Therapy: No  Prior Outpatient Therapy:    Additional Information: Additional Information 1:1 In Past 12 Months?: No CIRT Risk: No Elopement Risk: No Does patient have medical clearance?: Yes                  Objective: Blood pressure 119/73, pulse 83, temperature 98.1 F (36.7 C), temperature source Oral,  resp. rate 18, SpO2 100 %.There is no weight on file to calculate BMI. Results for orders placed or performed during the hospital encounter of 08/16/14 (from the past 72 hour(s))  CBC (if new onset seizures)     Status: Abnormal    Collection Time: 08/16/14  3:26 PM  Result Value Ref Range   WBC 12.7 (H) 4.0 - 10.5 K/uL   RBC 4.62 3.87 - 5.11 MIL/uL   Hemoglobin 14.4 12.0 - 15.0 g/dL   HCT 41.5 36.0 - 46.0 %   MCV 89.8 78.0 - 100.0 fL   MCH 31.2 26.0 - 34.0 pg   MCHC 34.7 30.0 - 36.0 g/dL   RDW 13.2 11.5 - 15.5 %   Platelets 412 (H) 150 - 400 K/uL  Valproic Acid (depakote) Level  (if patient is taking this medication)     Status: Abnormal   Collection Time: 08/16/14  3:27 PM  Result Value Ref Range   Valproic Acid Lvl <10.0 (L) 50.0 - 100.0 ug/mL    Comment: Performed at [facility]  Comprehensive metabolic panel     Status: Abnormal   Collection Time: 08/16/14  3:27 PM  Result Value Ref Range   Sodium 135 (L) 137 - 147 mEq/L   Potassium 4.0 3.7 - 5.3 mEq/L   Chloride 96 96 - 112 mEq/L   CO2 26 19 - 32 mEq/L   Glucose, Bld 95 70 - 99 mg/dL   BUN 12 6 - 23 mg/dL   Creatinine, Ser 0.81 0.50 - 1.10 mg/dL   Calcium 10.2 8.4 - 10.5 mg/dL   Total Protein 8.2 6.0 - 8.3 g/dL   Albumin 4.1 3.5 - 5.2 g/dL   AST 22 0 - 37 U/L   ALT 11 0 - 35 U/L   Alkaline Phosphatase 108 39 - 117 U/L   Total Bilirubin 1.3 (H) 0.3 - 1.2 mg/dL   GFR calc non Af Amer 79 (L) >90 mL/min   GFR calc Af Amer >90 >90 mL/min    Comment: (NOTE) The eGFR has been calculated using the CKD EPI equation. This calculation has not been validated in all clinical situations. eGFR's persistently <90 mL/min signify possible Chronic Kidney Disease.    Anion gap 13 5 - 15  Troponin I     Status: None   Collection Time: 08/16/14  3:27 PM  Result Value Ref Range   Troponin I <0.30 <0.30 ng/mL    Comment:        Due to the release kinetics of cTnI, a negative result within the first hours of the onset of symptoms does not rule out myocardial infarction with certainty. If myocardial infarction is still suspected, repeat the test at appropriate intervals.   Ethanol     Status: None   Collection Time: 08/16/14  3:27 PM  Result Value  Ref Range   Alcohol, Ethyl (B) <11 0 - 11 mg/dL    Comment:        LOWEST DETECTABLE LIMIT FOR SERUM ALCOHOL IS 11 mg/dL FOR MEDICAL PURPOSES ONLY   Urinalysis, Routine w reflex microscopic     Status: Abnormal   Collection Time: 08/16/14  9:24 PM  Result Value Ref Range   Color, Urine YELLOW YELLOW   APPearance CLEAR CLEAR   Specific Gravity, Urine 1.016 1.005 - 1.030   pH 6.5 5.0 - 8.0   Glucose, UA NEGATIVE NEGATIVE mg/dL   Hgb urine dipstick MODERATE (A) NEGATIVE   Bilirubin Urine NEGATIVE NEGATIVE   Ketones,  ur NEGATIVE NEGATIVE mg/dL   Protein, ur 30 (A) NEGATIVE mg/dL   Urobilinogen, UA 1.0 0.0 - 1.0 mg/dL   Nitrite NEGATIVE NEGATIVE   Leukocytes, UA NEGATIVE NEGATIVE  Urine rapid drug screen (hosp performed)     Status: None   Collection Time: 08/16/14  9:24 PM  Result Value Ref Range   Opiates NONE DETECTED NONE DETECTED   Cocaine NONE DETECTED NONE DETECTED   Benzodiazepines NONE DETECTED NONE DETECTED   Amphetamines NONE DETECTED NONE DETECTED   Tetrahydrocannabinol NONE DETECTED NONE DETECTED   Barbiturates NONE DETECTED NONE DETECTED    Comment:        DRUG SCREEN FOR MEDICAL PURPOSES ONLY.  IF CONFIRMATION IS NEEDED FOR ANY PURPOSE, NOTIFY LAB WITHIN 5 DAYS.        LOWEST DETECTABLE LIMITS FOR URINE DRUG SCREEN Drug Class       Cutoff (ng/mL) Amphetamine      1000 Barbiturate      200 Benzodiazepine   916 Tricyclics       606 Opiates          300 Cocaine          300 THC              50   Urine microscopic-add on     Status: Abnormal   Collection Time: 08/16/14  9:24 PM  Result Value Ref Range   Squamous Epithelial / LPF RARE RARE   WBC, UA 0-2 <3 WBC/hpf   RBC / HPF 3-6 <3 RBC/hpf   Bacteria, UA RARE RARE   Casts GRANULAR CAST (A) NEGATIVE   Urine-Other MUCOUS PRESENT    Labs are reviewed and are pertinent for no medical issues.  Current Facility-Administered Medications  Medication Dose Route Frequency Provider Last Rate Last Dose  .  acetaminophen (TYLENOL) tablet 650 mg  650 mg Oral Q4H PRN Ankit Nanavati, MD      . LORazepam (ATIVAN) tablet 0-4 mg  0-4 mg Oral 4 times per day Varney Biles, MD   Stopped at 08/16/14 1801   Followed by  . [START ON 08/18/2014] LORazepam (ATIVAN) tablet 0-4 mg  0-4 mg Oral Q12H Ankit Nanavati, MD      . LORazepam (ATIVAN) tablet 1 mg  1 mg Oral Q8H PRN Ankit Nanavati, MD      . nicotine (NICODERM CQ - dosed in mg/24 hours) patch 21 mg  21 mg Transdermal Daily Ankit Nanavati, MD   21 mg at 08/17/14 1047  . ondansetron (ZOFRAN) tablet 4 mg  4 mg Oral Q8H PRN Varney Biles, MD      . thiamine (VITAMIN B-1) tablet 100 mg  100 mg Oral Daily Ankit Nanavati, MD   100 mg at 08/17/14 1045   Or  . thiamine (B-1) injection 100 mg  100 mg Intravenous Daily Ankit Nanavati, MD   100 mg at 08/16/14 1738  . zolpidem (AMBIEN) tablet 5 mg  5 mg Oral QHS PRN Varney Biles, MD       Current Outpatient Prescriptions  Medication Sig Dispense Refill  . acetaminophen (TYLENOL) 500 MG tablet Take 1,000 mg by mouth every 6 (six) hours as needed for moderate pain.     Marland Kitchen albuterol (PROVENTIL HFA;VENTOLIN HFA) 108 (90 BASE) MCG/ACT inhaler Inhale 2 puffs into the lungs every 6 (six) hours as needed for wheezing or shortness of breath.    . cholecalciferol (VITAMIN D) 1000 UNITS tablet Take 1,000 Units by mouth daily.    . divalproex (  DEPAKOTE) 500 MG DR tablet Take 1 tablet (500 mg total) by mouth every 12 (twelve) hours. 60 tablet 6  . feeding supplement, ENSURE COMPLETE, (ENSURE COMPLETE) LIQD Take 237 mLs by mouth 2 (two) times daily between meals. 60 Bottle 6  . hydrOXYzine (ATARAX/VISTARIL) 25 MG tablet Take 1 tablet (25 mg total) by mouth every 6 (six) hours as needed for itching. 30 tablet 0  . levETIRAcetam (KEPPRA) 750 MG tablet Take 1 tablet (750 mg total) by mouth 2 (two) times daily. 60 tablet 6  . Multiple Vitamin (MULTIVITAMIN WITH MINERALS) TABS tablet Take 1 tablet by mouth daily. 30 tablet 0  .  naproxen sodium (ANAPROX) 220 MG tablet Take 440 mg by mouth 2 (two) times daily as needed (pain).    Marland Kitchen thiamine 100 MG tablet Take 1 tablet (100 mg total) by mouth daily.    . folic acid (FOLVITE) 1 MG tablet Take 1 tablet (1 mg total) by mouth daily. (Patient not taking: Reported on 08/17/2014) 30 tablet 3    Psychiatric Specialty Exam:     Blood pressure 119/73, pulse 83, temperature 98.1 F (36.7 C), temperature source Oral, resp. rate 18, SpO2 100 %.There is no weight on file to calculate BMI.  General Appearance: Casual  Eye Contact::  Good  Speech:  Normal Rate  Volume:  Normal  Mood:  Euthymic  Affect:  Congruent  Thought Process:  Coherent  Orientation:  Full (Time, Place, and Person)  Thought Content:  WDL  Suicidal Thoughts:  No  Homicidal Thoughts:  No  Memory:  Immediate;   Fair Recent;   Poor Remote;   Poor  Judgement:  Fair  Insight:  Fair  Psychomotor Activity:  Normal  Concentration:  Fair  Recall:  Poor  Fund of Knowledge:Poor  Language: Fair  Akathisia:  No  Handed:  Right  AIMS (if indicated):     Assets:  Housing Leisure Time Physical Health Resilience Social Support  Sleep:      Musculoskeletal: Strength & Muscle Tone: within normal limits Gait & Station: normal Patient leans: N/A  Treatment Plan Summary: Daily contact with patient to assess and evaluate symptoms and progress in treatment Medication management; ativan alcohol detox protocol in place, observer over night and re-evaluate in the am  Waylan Boga, [place]  08/17/2014 1:07 PM
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
## 2014-08-18 ENCOUNTER — Encounter (HOSPITAL_COMMUNITY): Payer: Self-pay | Admitting: Registered Nurse

## 2014-08-18 MED ORDER — DIVALPROEX SODIUM 500 MG PO DR TAB
500.0000 mg | DELAYED_RELEASE_TABLET | Freq: Two times a day (BID) | ORAL | Status: DC
Start: 2014-08-18 — End: 2015-01-19

## 2014-08-18 MED ORDER — NICOTINE 21 MG/24HR TD PT24: 21.0000 mg | MEDICATED_PATCH | Freq: Every day | TRANSDERMAL | Status: DC

## 2014-08-18 MED ORDER — LORAZEPAM 1 MG PO TABS: 1.0000 mg | ORAL_TABLET | Freq: Three times a day (TID) | ORAL | Status: DC | PRN

## 2014-08-18 MED ORDER — LORAZEPAM 2 MG PO TABS: 0.0000 mg | ORAL_TABLET | Freq: Four times a day (QID) | ORAL | Status: DC

## 2014-08-18 NOTE — [facility] Suicide Risk Assessment (Cosign Needed)
Suicide Risk Assessment  Discharge Assessment     Demographic Factors:  Female  Total Time spent with patient: 30 minutes  Psychiatric Specialty Exam:     Blood pressure 125/72, pulse 81, temperature 97.9 F (36.6 C), temperature source Oral, resp. rate 20, SpO2 100 %.There is no weight on file to calculate BMI.  General Appearance: Casual  Eye Contact:: Good  Speech: Normal Rate  Volume: Normal  Mood: "Good, I feel much better"  Affect: Congruent  Thought Process: Circumstantial, Coherent and Goal Directed  Orientation: Full (Time, Place, and Person)  Thought Content: WDL  Suicidal Thoughts: No  Homicidal Thoughts: No  Memory: Immediate; Fair Recent; Fair Remote; Fair  Judgement: Fair  Insight: Present  Psychomotor Activity: Normal  Concentration: Fair  Recall: [employer] of Knowledge:Fair  Language: Good  Akathisia: No  Handed: Right  AIMS (if indicated):    Assets: Housing Leisure Time Physical Health Resilience Social Support  Sleep:     Musculoskeletal: Strength & Muscle Tone: within normal limits Gait & Station: normal Patient leans: N/A   Mental Status Per Nursing Assessment::   On Admission:     Current Mental Status by Physician: denies homicidal/suicidal ideation, psychosis, and paranoia  Loss Factors: NA  Historical Factors: NA  Risk Reduction Factors:   Sense of responsibility to family, Religious beliefs about death and Positive social support  Continued Clinical Symptoms:  Medical Diagnoses and Treatments/Surgeries  Cognitive Features That Contribute To Risk:  None noted    Suicide Risk:  Minimal: No identifiable suicidal ideation.  Patients presenting with no risk factors but with morbid ruminations; may be classified as minimal risk based on the severity of the depressive symptoms  Discharge Diagnoses:  AXIS I: Alcohol Abuse, alcohol dependence with complicated with  withdrawal  AXIS II: Deferred AXIS III:  Past Medical History  Diagnosis Date  . Seizures   . Asthma    AXIS IV: other psychosocial or environmental problems, problems related to social environment and problems with primary support group AXIS V: 41-50 serious symptoms   Plan Of Care/Follow-up recommendations:  Activity:  as tolerated Diet:  as tolerated  Is patient on multiple antipsychotic therapies at discharge:  No   Has Patient had three or more failed trials of antipsychotic monotherapy by history:  No  Recommended Plan for Multiple Antipsychotic Therapies: NA    Drayce Tawil, FNP-BC 08/18/2014, 2:15 PM

## 2014-08-18 NOTE — ED Notes (Signed)
Ambulated to BR and back to bed unassisted. Alert and in NAD.

## 2014-08-18 NOTE — Discharge Instructions (Signed)
Epilepsy People with epilepsy have times when they shake and jerk uncontrollably (seizures). This happens when there is a sudden change in brain function. Epilepsy may have many possible causes. Anything that disturbs the normal pattern of brain cell activity can lead to seizures. HOME CARE   Follow your doctor's instructions about driving and safety during normal activities.  Get enough sleep.  Only take medicine as told by your doctor.  Avoid things that you know can cause you to have seizures (triggers).  Write down when your seizures happen and what you remember about each seizure. Write down anything you think may have caused the seizure to happen.  Tell the people you live and work with that you have seizures. Make sure they know how to help you. They should:  Cushion your head and body.  Turn you on your side.  Not restrain you.  Not place anything inside your mouth.  Call for local emergency medical help if there is any question about what has happened.  Keep all follow-up visits with your doctor. This is very important. GET HELP IF:  You get an infection or start to feel sick. You may have more seizures when you are sick.  You are having seizures more often.  Your seizure pattern is changing. GET HELP RIGHT AWAY IF:   A seizure does not stop after a few seconds or minutes.  A seizure causes you to have trouble breathing.  A seizure gives you a very bad headache.  A seizure makes you unable to speak or use a part of your body. Document Released: 07/11/2009 Document Revised: 07/04/2013 Document Reviewed: 04/25/2013 [facility] Patient Information 2015 [place], [place]. This information is not intended to replace advice given to you by your health care provider. Make sure you discuss any questions you have with your health care provider.  Seizure, Adult A seizure is abnormal electrical activity in the brain. Seizures usually last from 30 seconds to 2 minutes. There  are various types of seizures. Before a seizure, you may have a warning sensation (aura) that a seizure is about to occur. An aura may include the following symptoms:   Fear or anxiety.  Nausea.  Feeling like the room is spinning (vertigo).  Vision changes, such as seeing flashing lights or spots. Common symptoms during a seizure include:  A change in attention or behavior (altered mental status).  Convulsions with rhythmic jerking movements.  Drooling.  Rapid eye movements.  Grunting.  Loss of bladder and bowel control.  Bitter taste in the mouth.  Tongue biting. After a seizure, you may feel confused and sleepy. You may also have an injury resulting from convulsions during the seizure. HOME CARE INSTRUCTIONS   If you are given medicines, take them exactly as prescribed by your health care provider.  Keep all follow-up appointments as directed by your health care provider.  Do not swim or drive or engage in risky activity during which a seizure could cause further injury to you or others until your health care provider says it is OK.  Get adequate rest.  Teach friends and family what to do if you have a seizure. They should:  Lay you on the ground to prevent a fall.  Put a cushion under your head.  Loosen any tight clothing around your neck.  Turn you on your side. If vomiting occurs, this helps keep your airway clear.  Stay with you until you recover.  Know whether or not you need emergency care. SEEK IMMEDIATE  MEDICAL CARE IF:  The seizure lasts longer than 5 minutes.  The seizure is severe or you do not wake up immediately after the seizure.  You have an altered mental status after the seizure.  You are having more frequent or worsening seizures. Someone should drive you to the emergency department or call local emergency services (911 in [place] MAKE SURE YOU:  Understand these instructions.  Will watch your condition.  Will get help right away  if you are not doing well or get worse. Document Released: 09/10/2000 Document Revised: 07/04/2013 Document Reviewed: 04/25/2013 [facility] Patient Information 2015 [place], [place]. This information is not intended to replace advice given to you by your health care provider. Make sure you discuss any questions you have with your health care provider.

## 2014-08-18 NOTE — Consult Note (Signed)
[facility] Consult   Reason for Consult:  Alcohol dependence Referring Physician:  EDP  Lindsey [name] is an 58 y.o. female. Total Time spent with patient: 45 minutes  Assessment: AXIS I:  Alcohol Abuse, alcohol dependence with complicated with withdrawal  AXIS II:  Deferred AXIS III:   Past Medical History  Diagnosis Date  . Seizures   . Asthma    AXIS IV:  other psychosocial or environmental problems, problems related to social environment and problems with primary support group AXIS V:  41-50 serious symptoms  Plan:  Supportive therapy provided about ongoing stressors.  Subjective:   Lindsey [name] is a 58 y.o. female patient started on Ativan detox protocol, re-evaluate in the am for stability.  HPI:  The patient was found unresponsive at home, history of seizures.  She was given fluids and IV anti-seizure medication in the ED.  Lindsey [name] has also been drinking heavily the past month, pint to a fifth daily.  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  The patient denies suicidal/homicidal ideations, hallucinations, past psychiatric history or medications, and drug abuse.  Ativan alcohol detox protocol started, no signs or symptoms of withdrawal noted . Today:  Patient states that she is feeling better today.  States that she ran out of her seizure medication and needs to find a doctor that can prescribe because he doctor has lost license.  Patient states that he does not drink alcohol daily and states that the seizures were because she has been off of seizure medication for 1-2 month.  Patient denies suicidal/homicidal ideation, psychosis, and paranoia.  SW/TTS spoke with sister of patient and states that she is comfortable with patient coming back home.  '     HPI Elements:   Location:  generalized. Quality:  acute. Severity:  moderate. Timing:  constant. Duration:   month. Context:  stressors.  Past Psychiatric History: Past Medical History  Diagnosis Date  . Seizures   . Asthma     reports that she has never smoked. She does not have any smokeless tobacco history on file. She reports that she drinks alcohol. Her drug history is not on file. History reviewed. No pertinent family history. Family History Substance Abuse: Yes, Describe: (alcohol daily) Family Supports: Yes, List: (daughters) Living Arrangements: Children (adult children) Can pt return to current living arrangement?: Yes   Allergies:   Allergies  Allergen Reactions  . Morphine And Related   . Penicillins Nausea Only    ACT Assessment Complete:  Yes:    Educational Status    Risk to Self: Risk to self with the past 6 months Suicidal Ideation: No-Not Currently/Within Last 6 Months Suicidal Intent: No-Not Currently/Within Last 6 Months Is patient at risk for suicide?: No Suicidal Plan?: No-Not Currently/Within Last 6 Months Access to Means: No What has been your use of drugs/alcohol within the last 12 months?: alcohol Previous Attempts/Gestures: No Intentional Self Injurious Behavior: None Family Suicide History: Unknown Recent stressful life event(s): Loss (Comment), Conflict (Comment) Persecutory voices/beliefs?: No Substance abuse history and/or treatment for substance abuse?: No  Risk to Others: Risk to Others within the past 6 months Homicidal Ideation: No Thoughts of Harm to Others: No-Not Currently Present/Within Last 6 Months Current Homicidal Intent: No-Not Currently/Within Last 6 Months Current Homicidal Plan: No-Not Currently/Within Last 6 Months Access to Homicidal Means: No History of harm to others?: No Assessment of Violence: None Noted  Does patient have access to weapons?: No Criminal Charges Pending?: No Does patient have a court date: No  Abuse:    Prior Inpatient Therapy: Prior Inpatient Therapy Prior Inpatient Therapy: No  Prior Outpatient  Therapy:    Additional Information: Additional Information 1:1 In Past 12 Months?: No CIRT Risk: No Elopement Risk: No Does patient have medical clearance?: Yes                  Objective: Blood pressure 125/72, pulse 81, temperature 97.9 F (36.6 C), temperature source Oral, resp. rate 20, SpO2 100 %.There is no weight on file to calculate BMI. Results for orders placed or performed during the hospital encounter of 08/16/14 (from the past 72 hour(s))  CBC (if new onset seizures)     Status: Abnormal   Collection Time: 08/16/14  3:26 PM  Result Value Ref Range   WBC 12.7 (H) 4.0 - 10.5 K/uL   RBC 4.62 3.87 - 5.11 MIL/uL   Hemoglobin 14.4 12.0 - 15.0 g/dL   HCT 41.5 36.0 - 46.0 %   MCV 89.8 78.0 - 100.0 fL   MCH 31.2 26.0 - 34.0 pg   MCHC 34.7 30.0 - 36.0 g/dL   RDW 13.2 11.5 - 15.5 %   Platelets 412 (H) 150 - 400 K/uL  Valproic Acid (depakote) Level  (if patient is taking this medication)     Status: Abnormal   Collection Time: 08/16/14  3:27 PM  Result Value Ref Range   Valproic Acid Lvl <10.0 (L) 50.0 - 100.0 ug/mL    Comment: Performed at [facility]  Comprehensive metabolic panel     Status: Abnormal   Collection Time: 08/16/14  3:27 PM  Result Value Ref Range   Sodium 135 (L) 137 - 147 mEq/L   Potassium 4.0 3.7 - 5.3 mEq/L   Chloride 96 96 - 112 mEq/L   CO2 26 19 - 32 mEq/L   Glucose, Bld 95 70 - 99 mg/dL   BUN 12 6 - 23 mg/dL   Creatinine, Ser 0.81 0.50 - 1.10 mg/dL   Calcium 10.2 8.4 - 10.5 mg/dL   Total Protein 8.2 6.0 - 8.3 g/dL   Albumin 4.1 3.5 - 5.2 g/dL   AST 22 0 - 37 U/L   ALT 11 0 - 35 U/L   Alkaline Phosphatase 108 39 - 117 U/L   Total Bilirubin 1.3 (H) 0.3 - 1.2 mg/dL   GFR calc non Af Amer 79 (L) >90 mL/min   GFR calc Af Amer >90 >90 mL/min    Comment: (NOTE) The eGFR has been calculated using the CKD EPI equation. This calculation has not been validated in all clinical situations. eGFR's persistently <90 mL/min signify  possible Chronic Kidney Disease.    Anion gap 13 5 - 15  Troponin I     Status: None   Collection Time: 08/16/14  3:27 PM  Result Value Ref Range   Troponin I <0.30 <0.30 ng/mL    Comment:        Due to the release kinetics of cTnI, a negative result within the first hours of the onset of symptoms does not rule out myocardial infarction with certainty. If myocardial infarction is still suspected, repeat the test at appropriate intervals.   Ethanol     Status: None   Collection Time: 08/16/14  3:27 PM  Result Value Ref Range   Alcohol, Ethyl (B) <11 0 - 11 mg/dL    Comment:  LOWEST DETECTABLE LIMIT FOR SERUM ALCOHOL IS 11 mg/dL FOR MEDICAL PURPOSES ONLY   Urinalysis, Routine w reflex microscopic     Status: Abnormal   Collection Time: 08/16/14  9:24 PM  Result Value Ref Range   Color, Urine YELLOW YELLOW   APPearance CLEAR CLEAR   Specific Gravity, Urine 1.016 1.005 - 1.030   pH 6.5 5.0 - 8.0   Glucose, UA NEGATIVE NEGATIVE mg/dL   Hgb urine dipstick MODERATE (A) NEGATIVE   Bilirubin Urine NEGATIVE NEGATIVE   Ketones, ur NEGATIVE NEGATIVE mg/dL   Protein, ur 30 (A) NEGATIVE mg/dL   Urobilinogen, UA 1.0 0.0 - 1.0 mg/dL   Nitrite NEGATIVE NEGATIVE   Leukocytes, UA NEGATIVE NEGATIVE  Urine rapid drug screen (hosp performed)     Status: None   Collection Time: 08/16/14  9:24 PM  Result Value Ref Range   Opiates NONE DETECTED NONE DETECTED   Cocaine NONE DETECTED NONE DETECTED   Benzodiazepines NONE DETECTED NONE DETECTED   Amphetamines NONE DETECTED NONE DETECTED   Tetrahydrocannabinol NONE DETECTED NONE DETECTED   Barbiturates NONE DETECTED NONE DETECTED    Comment:        DRUG SCREEN FOR MEDICAL PURPOSES ONLY.  IF CONFIRMATION IS NEEDED FOR ANY PURPOSE, NOTIFY LAB WITHIN 5 DAYS.        LOWEST DETECTABLE LIMITS FOR URINE DRUG SCREEN Drug Class       Cutoff (ng/mL) Amphetamine      1000 Barbiturate      200 Benzodiazepine   478 Tricyclics        295 Opiates          300 Cocaine          300 THC              50   Urine microscopic-add on     Status: Abnormal   Collection Time: 08/16/14  9:24 PM  Result Value Ref Range   Squamous Epithelial / LPF RARE RARE   WBC, UA 0-2 <3 WBC/hpf   RBC / HPF 3-6 <3 RBC/hpf   Bacteria, UA RARE RARE   Casts GRANULAR CAST (A) NEGATIVE   Urine-Other MUCOUS PRESENT    Labs are reviewed and are pertinent for no medical issues.  Current Facility-Administered Medications  Medication Dose Route Frequency Provider Last Rate Last Dose  . acetaminophen (TYLENOL) tablet 650 mg  650 mg Oral Q4H PRN Varney Biles, MD   650 mg at 08/18/14 0523  . albuterol (PROVENTIL HFA;VENTOLIN HFA) 108 (90 BASE) MCG/ACT inhaler 2 puff  2 puff Inhalation Q6H PRN Waylan Boga, NP      . divalproex (DEPAKOTE) DR tablet 500 mg  500 mg Oral Q12H Waylan Boga, NP   500 mg at 08/18/14 0906  . folic acid (FOLVITE) tablet 1 mg  1 mg Oral Daily Waylan Boga, NP   1 mg at 08/18/14 0906  . LORazepam (ATIVAN) tablet 0-4 mg  0-4 mg Oral 4 times per day Varney Biles, MD   1 mg at 08/18/14 1120   Followed by  . LORazepam (ATIVAN) tablet 0-4 mg  0-4 mg Oral Q12H Ankit Nanavati, MD      . LORazepam (ATIVAN) tablet 1 mg  1 mg Oral Q8H PRN Varney Biles, MD   1 mg at 08/18/14 0523  . nicotine (NICODERM CQ - dosed in mg/24 hours) patch 21 mg  21 mg Transdermal Daily Ankit Nanavati, MD   21 mg at 08/17/14 1047  . ondansetron (ZOFRAN) tablet 4  mg  4 mg Oral Q8H PRN Varney Biles, MD      . thiamine (VITAMIN B-1) tablet 100 mg  100 mg Oral Daily Ankit Nanavati, MD   100 mg at 08/18/14 6599   Or  . thiamine (B-1) injection 100 mg  100 mg Intravenous Daily Ankit Nanavati, MD   100 mg at 08/16/14 1738  . zolpidem (AMBIEN) tablet 5 mg  5 mg Oral QHS PRN Varney Biles, MD   5 mg at 08/17/14 2229   Current Outpatient Prescriptions  Medication Sig Dispense Refill  . acetaminophen (TYLENOL) 500 MG tablet Take 1,000 mg by mouth every 6 (six)  hours as needed for moderate pain.     Marland Kitchen albuterol (PROVENTIL HFA;VENTOLIN HFA) 108 (90 BASE) MCG/ACT inhaler Inhale 2 puffs into the lungs every 6 (six) hours as needed for wheezing or shortness of breath.    . cholecalciferol (VITAMIN D) 1000 UNITS tablet Take 1,000 Units by mouth daily.    . divalproex (DEPAKOTE) 500 MG DR tablet Take 1 tablet (500 mg total) by mouth every 12 (twelve) hours. 60 tablet 6  . feeding supplement, ENSURE COMPLETE, (ENSURE COMPLETE) LIQD Take 237 mLs by mouth 2 (two) times daily between meals. 60 Bottle 6  . hydrOXYzine (ATARAX/VISTARIL) 25 MG tablet Take 1 tablet (25 mg total) by mouth every 6 (six) hours as needed for itching. 30 tablet 0  . levETIRAcetam (KEPPRA) 750 MG tablet Take 1 tablet (750 mg total) by mouth 2 (two) times daily. 60 tablet 6  . Multiple Vitamin (MULTIVITAMIN WITH MINERALS) TABS tablet Take 1 tablet by mouth daily. 30 tablet 0  . naproxen sodium (ANAPROX) 220 MG tablet Take 440 mg by mouth 2 (two) times daily as needed (pain).    Marland Kitchen thiamine 100 MG tablet Take 1 tablet (100 mg total) by mouth daily.    . folic acid (FOLVITE) 1 MG tablet Take 1 tablet (1 mg total) by mouth daily. (Patient not taking: Reported on 08/17/2014) 30 tablet 3    Psychiatric Specialty Exam:     Blood pressure 125/72, pulse 81, temperature 97.9 F (36.6 C), temperature source Oral, resp. rate 20, SpO2 100 %.There is no weight on file to calculate BMI.  General Appearance: Casual  Eye Contact::  Good  Speech:  Normal Rate  Volume:  Normal  Mood:  "Good, I feel much better"  Affect:  Congruent  Thought Process:  Circumstantial, Coherent and Goal Directed  Orientation:  Full (Time, Place, and Person)  Thought Content:  WDL  Suicidal Thoughts:  No  Homicidal Thoughts:  No  Memory:  Immediate;   Fair Recent;   Fair Remote;   Fair  Judgement:  Fair  Insight:  Present  Psychomotor Activity:  Normal  Concentration:  Fair  Recall:  [employer] [place]   Language: Good  Akathisia:  No  Handed:  Right  AIMS (if indicated):     Assets:  Housing Leisure Time Physical Health Resilience Social Support  Sleep:      Musculoskeletal: Strength & Muscle Tone: within normal limits Gait & Station: normal Patient leans: N/A  Treatment Plan Summary: Discharge home with 30 day RX for Depakote 500 mg Q 12 hr.  Also give patient resources for primary doctor and neurologist for follow and seizure management.     Earleen Newport, FNP-BC 08/18/2014 1:53 PM

## 2014-09-16 ENCOUNTER — Emergency Department (HOSPITAL_COMMUNITY)
Admission: EM | Admit: 2014-09-16 | Discharge: 2014-09-16 | Disposition: A | Discharge status: Home / Self Care | Payer: Medicaid Other | Attending: Emergency Medicine | Admitting: Emergency Medicine

## 2014-09-16 ENCOUNTER — Encounter (HOSPITAL_COMMUNITY): Payer: Self-pay

## 2014-09-16 DIAGNOSIS — J45909 Unspecified asthma, uncomplicated: Secondary | ICD-10-CM | POA: Insufficient documentation

## 2014-09-16 DIAGNOSIS — Z79899 Other long term (current) drug therapy: Secondary | ICD-10-CM | POA: Diagnosis not present

## 2014-09-16 DIAGNOSIS — F1011 Alcohol abuse, in remission: Secondary | ICD-10-CM

## 2014-09-16 DIAGNOSIS — Z8659 Personal history of other mental and behavioral disorders: Secondary | ICD-10-CM | POA: Insufficient documentation

## 2014-09-16 DIAGNOSIS — R569 Unspecified convulsions: Secondary | ICD-10-CM | POA: Diagnosis present

## 2014-09-16 DIAGNOSIS — G40909 Epilepsy, unspecified, not intractable, without status epilepticus: Secondary | ICD-10-CM

## 2014-09-16 DIAGNOSIS — Z880 Allergy status to penicillin: Secondary | ICD-10-CM | POA: Insufficient documentation

## 2014-09-16 MED ORDER — DIVALPROEX SODIUM 500 MG PO DR TAB
500.0000 mg | DELAYED_RELEASE_TABLET | Freq: Once | ORAL | Status: AC
  Administered 2014-09-16: 500 mg via ORAL
  Filled 2014-09-16: qty 1

## 2014-09-16 NOTE — ED Notes (Signed)
[facility] called for transportation  

## 2014-09-16 NOTE — ED Notes (Signed)
Patient reports that she has been drinking heavily and not taking her meds. Patient states she has been drinking today.

## 2014-09-16 NOTE — ED Provider Notes (Signed)
CSN: [id number]     Arrival date & time 09/16/14  1813 History   First MD Initiated Contact with Patient 09/16/14 1841     Chief Complaint  Patient presents with  . Seizures     (Consider location/radiation/quality/duration/timing/severity/associated sxs/prior Treatment) HPI The patient reports that she has been told that she had a seizure. She reports that she has a known seizure disorder. She reports that she has not been taking her Depakote. The patient is not endorsing alcohol use at this time however review of medical record indicates heavy chronic alcohol use as well. Patient reports she has pain on the right side of her tongue where she bit it and her jaw hurts a little bit. She denies any other acute complaints. 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 She reports her daughter takes care of her in most things. Past Medical History  Diagnosis Date  . Seizures   . Asthma    Past Surgical History  Procedure Laterality Date  . Abdominal hysterectomy     History reviewed. No pertinent family history. History  Substance Use Topics  . Smoking status: Never Smoker   . Smokeless tobacco: Never Used  . Alcohol Use: Yes     Comment: drinks heavily   OB History    No data available     Review of Systems  10 Systems reviewed and are negative for acute change except as noted in the HPI.   Allergies  Morphine and related and Penicillins  Home Medications   Prior to Admission medications   Medication Sig Start Date End Date Taking? Authorizing Provider  acetaminophen (TYLENOL) 500 MG tablet Take 1,000 mg by mouth every 6 (six) hours as needed for moderate pain.    Yes Historical Provider, MD  albuterol (PROVENTIL HFA;VENTOLIN HFA) 108 (90 BASE) MCG/ACT inhaler Inhale 2 puffs into the lungs every 6 (six) hours as needed for wheezing or shortness of breath.   Yes Historical Provider, MD  divalproex (DEPAKOTE) 500 MG DR tablet Take 1  tablet (500 mg total) by mouth every 12 (twelve) hours. 08/18/14  Yes Shuvon Rankin, NP  levETIRAcetam (KEPPRA) 750 MG tablet Take 1 tablet (750 mg total) by mouth 2 (two) times daily. 03/06/14  Yes Marianne L York, PA-C  divalproex (DEPAKOTE) 500 MG DR tablet Take 1 tablet (500 mg total) by mouth every 12 (twelve) hours. Patient not taking: Reported on 09/16/2014 03/06/14   [name], PA-C  feeding supplement, ENSURE COMPLETE, (ENSURE COMPLETE) LIQD Take 237 mLs by mouth 2 (two) times daily between meals. Patient not taking: Reported on 09/16/2014 03/06/14   [name], PA-C  folic acid (FOLVITE) 1 MG tablet Take 1 tablet (1 mg total) by mouth daily. Patient not taking: Reported on 08/17/2014 03/06/14   [name], PA-C  hydrOXYzine (ATARAX/VISTARIL) 25 MG tablet Take 1 tablet (25 mg total) by mouth every 6 (six) hours as needed for itching. Patient not taking: Reported on 09/16/2014 11/09/13   [name], MD  LORazepam (ATIVAN) 1 MG tablet Take 1 tablet (1 mg total) by mouth every 8 (eight) hours as needed for anxiety (agitation). Patient not taking: Reported on 09/16/2014 08/18/14   Shuvon Rankin, NP  LORazepam (ATIVAN) 2 MG tablet Take 0-2 tablets (0-4 mg total) by mouth every 6 (six) hours. Patient not taking: Reported on 09/16/2014 08/18/14   Shuvon Rankin, NP  Multiple Vitamin (MULTIVITAMIN WITH MINERALS) TABS tablet Take  1 tablet by mouth daily. Patient not taking: Reported on 09/16/2014 11/09/13   [name], MD  nicotine (NICODERM CQ - DOSED IN MG/24 HOURS) 21 mg/24hr patch Place 1 patch (21 mg total) onto the skin daily. Patient not taking: Reported on 09/16/2014 08/18/14   Shuvon Rankin, NP  thiamine 100 MG tablet Take 1 tablet (100 mg total) by mouth daily. Patient not taking: Reported on 09/16/2014 03/06/14   [name], PA-C   Pulse 88  Temp(Src) 97.9 F (36.6 C) (Oral)  Resp 18  SpO2 100% Physical Exam  Constitutional: She is oriented to person,  place, and time. She appears well-developed and well-nourished.  The patient is calm, nontoxic and alert. She is no respiratory distress.  HENT:  Head: Normocephalic and atraumatic.  Mouth/Throat: Oropharynx is clear and moist.  Patient ports having bitten the right side of her tongue however I don't see an acute tongue laceration. She does endorse some tenderness to palpation along the right mandible. There however is no deformity or swelling. And normal range of motion of the TMJ.  Eyes: EOM are normal. Pupils are equal, round, and reactive to light.  Neck: Neck supple.  No cervical spine tenderness, supple.  Cardiovascular: Normal rate, regular rhythm, normal heart sounds and intact distal pulses.   Pulmonary/Chest: Effort normal and breath sounds normal.  Abdominal: Soft. Bowel sounds are normal. She exhibits no distension. There is tenderness (patient endorses diffuse tenderness to palpation. She has no guarding present. With distraction there is no pain response.). There is no rebound and no guarding.  Musculoskeletal: Normal range of motion. She exhibits no edema or tenderness.  There are no acute appearing deformities. The patient has intact range of motion of all 4 extremities. The patient's nurse expressed concern for the left wrist and potential need for imaging. I have however examined this and find that she has thickening of the wrist but no acute effusion she does not have pain with significant range of motion against resistance. There is no focal bony pain to palpation. Review of medical record indicates a prior comminuted wrist fracture in 2013. These findings are consistent with old injury.  Neurological: She is alert and oriented to person, place, and time. She has normal strength. No cranial nerve deficit. Coordination normal. GCS eye subscore is 4. GCS verbal subscore is 5. GCS motor subscore is 6.  The patient's mental status is clear. She reports poor recall due to old brain  injury. She however has normal speech patterns and normal communication. She is not showing any signs of tremulousness or anxiety to suggest withdrawal. She follows all commands appropriately.  Skin: Skin is warm, dry and intact.  Psychiatric: She has a normal mood and affect.    ED Course  Procedures (including critical care time) Labs Review Labs Reviewed - No data to display  Imaging Review No results found.   EKG Interpretation None      MDM   Final diagnoses:  Seizure disorder  History of alcohol abuse   At this time patient is seen for prior seizure. Patient has known seizure disorder. She is reporting noncompliance with Depakote. She also has a history of alcohol abuse. Currently she is not showing any evidence of alcohol withdrawal. Her mental status is completely clear vital signs are stable, she is not tremulous or confused. It appears most likely etiology is noncompliance with medications.    Lindsey Shanks, MD 09/16/14 [phone]

## 2014-09-16 NOTE — ED Notes (Signed)
Bed: WA10 Expected date:  Expected time:  Means of arrival:  Comments: ems 

## 2014-09-16 NOTE — ED Notes (Signed)
Per EMS- Family witnessed a seizure that lasted one minute. Patient has not taken seizure medication x 3 weeks. Patient was postical when EMS arrived. Patient is now alert and oriented.

## 2014-09-16 NOTE — Discharge Instructions (Signed)
Seizure, Adult °A seizure is abnormal electrical activity in the brain. Seizures usually last from 30 seconds to 2 minutes. There are various types of seizures. °Before a seizure, you may have a warning sensation (aura) that a seizure is about to occur. An aura may include the following symptoms:  °· Fear or anxiety. °· Nausea. °· Feeling like the room is spinning (vertigo). °· Vision changes, such as seeing flashing lights or spots. °Common symptoms during a seizure include: °· A change in attention or behavior (altered mental status). °· Convulsions with rhythmic jerking movements. °· Drooling. °· Rapid eye movements. °· Grunting. °· Loss of bladder and bowel control. °· Bitter taste in the mouth. °· Tongue biting. °After a seizure, you may feel confused and sleepy. You may also have an injury resulting from convulsions during the seizure. °HOME CARE INSTRUCTIONS  °· If you are given medicines, take them exactly as prescribed by your health care provider. °· Keep all follow-up appointments as directed by your health care provider. °· Do not swim or drive or engage in risky activity during which a seizure could cause further injury to you or others until your health care provider says it is OK. °· Get adequate rest. °· Teach friends and family what to do if you have a seizure. They should: °¨ Lay you on the ground to prevent a fall. °¨ Put a cushion under your head. °¨ Loosen any tight clothing around your neck. °¨ Turn you on your side. If vomiting occurs, this helps keep your airway clear. °¨ Stay with you until you recover. °¨ Know whether or not you need emergency care. °SEEK IMMEDIATE MEDICAL CARE IF: °· The seizure lasts longer than 5 minutes. °· The seizure is severe or you do not wake up immediately after the seizure. °· You have an altered mental status after the seizure. °· You are having more frequent or worsening seizures. °Someone should drive you to the emergency department or call local emergency  services (911 in [place] °MAKE SURE YOU: °· Understand these instructions. °· Will watch your condition. °· Will get help right away if you are not doing well or get worse. °Document Released: 09/10/2000 Document Revised: 07/04/2013 Document Reviewed: 04/25/2013 °ExitCare® Patient Information ©2015 [employer]. This information is not intended to replace advice given to you by your health care provider. Make sure you discuss any questions you have with your health care provider. ° °Emergency Department Resource Guide °1) Find a Doctor and Pay Out of Pocket °Although you won't have to find out who is covered by your insurance plan, it is a good idea to ask around and get recommendations. You will then need to call the office and see if the doctor you have chosen will accept you as a new patient and what types of options they offer for patients who are self-pay. Some doctors offer discounts or will set up payment plans for their patients who do not have insurance, but you will need to ask so you aren't surprised when you get to your appointment. ° °2) Contact Your Local Health Department °Not all health departments have doctors that can see patients for sick visits, but many do, so it is worth a call to see if yours does. If you don't know where your local health department is, you can check in your phone book. The CDC also has a tool to help you locate your state's health department, and many state websites also have listings of all of their   local health departments. ° °3) Find a Walk-in Clinic °If your illness is not likely to be very severe or complicated, you may want to try a walk in clinic. These are popping up all over the country in pharmacies, drugstores, and shopping centers. They're usually staffed by nurse practitioners or physician assistants that have been trained to treat common illnesses and complaints. They're usually fairly quick and inexpensive. However, if you have serious medical issues or  chronic medical problems, these are probably not your best option. ° °No Primary Care Doctor: °- Call [facility] at  832-8000 - they can help you locate a primary care doctor that  accepts your insurance, provides certain services, etc. °- Physician Referral Service- 1-800-533-3463 ° °Chronic Pain Problems: °Organization         Address  Phone   Notes  °[facility]  (336) 297-2271 Patients need to be referred by their primary care doctor.  ° °Medication Assistance: °Organization         Address  Phone   Notes  °[place] [facility] [address] °[place], [place] [address] (336) 641-8030 --Must be a resident of [place] °-- Must have NO insurance coverage whatsoever (no Medicaid/ Medicare, etc.) °-- The pt. MUST have a primary care doctor that directs their care regularly and follows them in the community °  °[facility]  (866) 331-1348   °[employer]  (888) 892-1162   ° °Agencies that provide inexpensive medical care: °Organization         Address  Phone   Notes  °[facility]  (336) 832-8035   °[facility]    (336) 832-7272   °[facility] [address] °[place], [place] [address] (336) 832-4777   °[facility] [address], °[place] (336) 271-4999   °[facility]    (336) 373-0678   °[facility]    (336) 272-1050   °[facility] ° [address], [place] Phone:  (336) 832-4444, Fax:  (336) 832-4440 Hours of Operation:  9 am - 6 pm, M-F.  Also accepts Medicaid/Medicare and self-pay.  °[facility] ° [address], [place] Phone: (336) 832-3150, Fax: (336) 832-3151. Hours of Operation:  8:30 am - 5:30 pm, M-F.  Also accepts Medicaid and self-pay.  °[facility] [place] [address], [place] Phone: (336) 878-6027   °[facility] [address], [place],   (336)723-1848, Ext. 123 Mondays & Thursdays: 7-9 AM.  First 15 patients are seen on a first come, first serve basis. °  ° °Medicaid-accepting [place] Providers: ° °Organization         Address  Phone   Notes  °[facility] [address], [place] (336) 641-2100 Also accepts self-pay patients.  °[facility] [address], [place] ° (336) 856-9996   °[facility] [address], [place] (336) 288-8857   °[facility] [address] [place] [address], [place] (336) 299-7000   °Veita Bland [address],   ° (336) 373-1557 Only accepts [place] Access Medicaid patients after they have their name applied to their card.  ° °Self-Pay (no insurance) in [place]: ° °Organization         Address  Phone   Notes  °Sickle Cell Patients,   [facility] [address], [place] (336) 832-1970   °[facility] [address], [place] (336) 832-4400   °[facility] [place] ° [address], [place] (336) 992-4800   °[facility]/Dr. Osei-Bonsu ° [address] [place] [address], [place] or [address], [place] (336) 841-8500 Phone number for both [place] and [place] locations is the same.  °[facility] [address], [place] (336) 299-0000   °[facility] [address] [place] [address], [place] or [address] (336) 852-7530 °(336) 878-2260   °[facility] [address], [place] (336) 350-1642, phone; (336) 294-5005, fax Sees patients 1st and 3rd Saturday of every month.  Must not qualify for public or private insurance (i.e. Medicaid, Medicare, [place] Health Choice, Veterans' Benefits) • Household income should be no more than 200% of the poverty level •The clinic cannot treat you if you are pregnant or think you are pregnant • Sexually transmitted  diseases are not treated at the clinic.  ° ° °Dental Care: °Organization         Address  Phone  Notes  °[facility] [address], [place] (336) 641-6152 Accepts children up to age 21 who are enrolled in Medicaid or [place] Health Choice; pregnant women with a Medicaid card; and children who have applied for Medicaid or [place] Health Choice, but were declined, whose parents can pay a reduced fee at time of service.  °[facility] [place]  [address], [place] (336) 641-7733 Accepts children up to age 21 who are enrolled in Medicaid or [place] Health Choice; pregnant women with a Medicaid card; and children who have applied for Medicaid or [place] Health Choice, but were declined, whose parents can pay a reduced fee at time of service.  °[facility] ° [address], [place] (336) 641-4533 Patients are seen by appointment only. Walk-ins are not accepted. [facility] will see patients 18 years of age and older. °Monday - Tuesday (8am-5pm) °Most Wednesdays (8:30-5pm) °$30 per visit, cash only  °[facility] ° [address], [place] (336) 641-4533 Patients are seen by appointment only. Walk-ins are not accepted. [facility] will see patients 18 years of age and older. °One Wednesday Evening (Monthly: Volunteer Based).  $30 per visit, cash only  °[facility]  (919) 537-3737 for adults; Children under age 4, call Graduate Pediatric Dentistry at (919) [phone]. Children aged 4-14, please call (919) [phone] to request a pediatric application. ° Dental services are provided in all areas of dental care including fillings, crowns and bridges, complete and partial dentures, implants, gum treatment, root canals, and extractions. Preventive care is also provided. Treatment is provided to both adults and children. °Patients are selected via a  lottery and there is often a waiting list. °  °[facility] [address], °[place] ° (336) 763-8833 [email or web address] °  °[facility] [address], [place], [place] (336)723-1848, Ext. 123 Second and Fourth Thursday of each month, opens at 6:30 AM; Clinic ends at 9 AM.  Patients are seen on a first-come first-served basis, and a limited number are seen during each clinic.  ° °[facility] ° [address], [place],  (336) 723-7904   Eligibility Requirements °You must   have lived in [place], [place], or [place] counties for at least the last three months. °  You cannot be eligible for state or federal sponsored healthcare insurance, including Veterans Administration, Medicaid, or Medicare. °  You generally cannot be eligible for healthcare insurance through your employer.  °  How to apply: °Eligibility screenings are held every Tuesday and Wednesday afternoon from 1:00 pm until 4:00 pm. You do not need an appointment for the interview!  °[facility] [address], [place], [place] 336-631-2330   °[facility]  336-342-8273   °[facility]  336-703-3100   °[facility]  336-570-6415   ° °Behavioral Health Resources in the Community: °Intensive Outpatient Programs °Organization         Address  Phone  Notes  °[facility] [address], [place], [place] 336-878-6098   °[facility] [address], [place], [place] 336-832-9800   °[facility] [address], [place], [place] ° 336-882-2125   °[facility] [name],  °[place], [place] 1-800-853-5163 or 336-641-4981   °Substance Abuse Resources °Organization         Address  Phone  Notes  °[facility]  336-882-2125   °[facility]  336-784-9470   °[facility]  336-285-9073   °[facility]  336-845-3988   °Residential &  Outpatient Substance Abuse Program  1-800-659-3381   °Psychological Services °Organization         Address  Phone  Notes  °[facility]  336- 832-9600   °[facility]  336- 378-7881   °[facility] [name], [place] 1-800-853-5163 or 336-641-4981   ° °Mobile Crisis Teams °Organization         Address  Phone  Notes  °[facility], Mobile Crisis Care Unit  1-877-626-1772   °Assertive °[facility] ° [address] [place], [place] 336-834-9664   °Sharon DeEsch [address] °[place] 336-554-5454   ° °Self-Help/Support Groups °Organization         Address  Phone             Notes  °[facility] - variety of support groups  336- 373-1402 Call for more information  °Narcotics Anonymous (NA), [facility] [address], °[place]  2 meetings at this location  ° °Residential Treatment Programs °Organization         Address  Phone  Notes  °[facility] [address],    °[place]  1-866-801-8205   °[facility] ° [address], [place], [place] 704-293-8524   °[facility] [address], [place] 336-845-3988 Admissions: 8am-3pm M-F  °[facility] [address]    °[place], [place] 336-841-1104   °[facility] [address], [place], [place] 336-379-7146   °[facility] [address]  °[place], [place] 336-285-9073   °[facility] - Intensive Outpatient [address], [place], [place] 336-852-3033   °[facility] [address]  °[place], [place] 1-877-615-2722 or 336-784-9470   °[facility] [address] [place], [place] 336-227-7417 Accepts Medicaid  °[facility] [address]  °[place] 1-800-659-3381 Substance Abuse/Addiction Treatment  ° °[place] Behavioral Health Resources °Organization          Address    Phone  Notes  °[facility]  (888) 581-9988   °Julie Brannon, PhD [address] [place], [place]   (336) 349-5553 or (336) 951-0000   °[facility]   [address] °[place], [place] (336) 349-4454   °[facility] [address], [place], [place] (336) 342-8316 Insurance/Medicaid/sponsorship through [facility]  °[facility] [address]                                    [place], [place] (336) 342-8316 Therapy/tele-psych/case  °[facility] [address]  ° [place], [place] (336) 349-2233    °Dr. Arfeen  (336) 349-4544   °[facility] [place]  [employer] [place] [facility] 1) [address], [place] °2) [address], [place] °3)  [address], [place] (336) 349-3220 °(336) 342-7768 ° °(336) 342-8140   °[place] [facility] (336) 342-1394 or (336) 342-3537 (After Hours)    ° ° °

## 2014-09-16 NOTE — ED Notes (Signed)
Awake. Verbally responsive. A/O x4. Resp even and unlabored. No audible adventitious breath sounds noted. ABC's intact. Waiting on transport

## 2014-11-17 ENCOUNTER — Encounter (HOSPITAL_COMMUNITY): Payer: Self-pay | Admitting: Family Medicine

## 2014-11-17 ENCOUNTER — Emergency Department (HOSPITAL_COMMUNITY)
Admission: EM | Admit: 2014-11-17 | Discharge: 2014-11-17 | Disposition: A | Discharge status: Home / Self Care | Payer: Medicaid Other | Attending: Emergency Medicine | Admitting: Emergency Medicine

## 2014-11-17 DIAGNOSIS — J45909 Unspecified asthma, uncomplicated: Secondary | ICD-10-CM | POA: Diagnosis not present

## 2014-11-17 DIAGNOSIS — Z79899 Other long term (current) drug therapy: Secondary | ICD-10-CM | POA: Diagnosis not present

## 2014-11-17 DIAGNOSIS — Z880 Allergy status to penicillin: Secondary | ICD-10-CM | POA: Insufficient documentation

## 2014-11-17 DIAGNOSIS — R569 Unspecified convulsions: Secondary | ICD-10-CM

## 2014-11-17 DIAGNOSIS — G40909 Epilepsy, unspecified, not intractable, without status epilepticus: Secondary | ICD-10-CM | POA: Diagnosis not present

## 2014-11-17 LAB — I-STAT CHEM 8, ED
BUN: 20 mg/dL (ref 6–23)
CREATININE: 0.9 mg/dL (ref 0.50–1.10)
Calcium, Ion: 1.21 mmol/L (ref 1.12–1.23)
Chloride: 103 mmol/L (ref 96–112)
Glucose, Bld: 83 mg/dL (ref 70–99)
HCT: 46 % (ref 36.0–46.0)
Hemoglobin: 15.6 g/dL — ABNORMAL HIGH (ref 12.0–15.0)
POTASSIUM: 3.8 mmol/L (ref 3.5–5.1)
Sodium: 142 mmol/L (ref 135–145)
TCO2: 25 mmol/L (ref 0–100)

## 2014-11-17 LAB — URINALYSIS, ROUTINE W REFLEX MICROSCOPIC
Bilirubin Urine: NEGATIVE
Glucose, UA: NEGATIVE mg/dL
Hgb urine dipstick: NEGATIVE
KETONES UR: NEGATIVE mg/dL
Nitrite: NEGATIVE
Protein, ur: NEGATIVE mg/dL
Specific Gravity, Urine: 1.021 (ref 1.005–1.030)
Urobilinogen, UA: 0.2 mg/dL (ref 0.0–1.0)
pH: 6 (ref 5.0–8.0)

## 2014-11-17 LAB — RAPID URINE DRUG SCREEN, HOSP PERFORMED
Amphetamines: NOT DETECTED
BARBITURATES: NOT DETECTED
BENZODIAZEPINES: NOT DETECTED
Cocaine: POSITIVE — AB
Opiates: NOT DETECTED
Tetrahydrocannabinol: POSITIVE — AB

## 2014-11-17 LAB — VALPROIC ACID LEVEL

## 2014-11-17 LAB — URINE MICROSCOPIC-ADD ON

## 2014-11-17 MED ORDER — SODIUM CHLORIDE 0.9 % IV BOLUS (SEPSIS)
1000.0000 mL | INTRAVENOUS | Status: AC
  Administered 2014-11-17: 1000 mL via INTRAVENOUS

## 2014-11-17 MED ORDER — DIVALPROEX SODIUM 500 MG PO DR TAB: 500.0000 mg | DELAYED_RELEASE_TABLET | Freq: Two times a day (BID) | ORAL | Status: DC

## 2014-11-17 MED ORDER — DIVALPROEX SODIUM 500 MG PO DR TAB
500.0000 mg | DELAYED_RELEASE_TABLET | ORAL | Status: AC
  Administered 2014-11-17: 500 mg via ORAL
  Filled 2014-11-17: qty 1

## 2014-11-17 NOTE — ED Notes (Signed)
Per EMS, pt is from home. She has had 5 focal seizures in a row. Also, normally has grand-mal seizures. Normally, takes Depakote but has been out of medication for 2 days. Last seizure was 2 weeks ago.

## 2014-11-17 NOTE — ED Provider Notes (Signed)
CSN: [id number]     Arrival date & time 11/17/14  1947 History   First MD Initiated Contact with Patient 11/17/14 1957     Chief Complaint  Patient presents with  . Seizures     (Consider location/radiation/quality/duration/timing/severity/associated sxs/prior Treatment) Patient is a 59 y.o. female presenting with seizures. The history is provided by the EMS personnel.  Seizures Seizure activity on arrival: no   Seizure type:  Unable to specify Initial focality:  Unable to specify Episode characteristics: generalized shaking   Postictal symptoms: confusion and somnolence   Severity:  Mild Timing:  Clustered Number of seizures this episode:  4-5 Progression:  Resolved Context comment:  Out of medication PTA treatment:  None History of seizures: yes     Past Medical History  Diagnosis Date  . Seizures   . Asthma    Past Surgical History  Procedure Laterality Date  . Abdominal hysterectomy     History reviewed. No pertinent family history. History  Substance Use Topics  . Smoking status: Never Smoker   . Smokeless tobacco: Never Used  . Alcohol Use: Yes     Comment: drinks heavily. Agrees with drinking every other day.    OB History    No data available     Review of Systems  Constitutional: Negative for fever and fatigue.  HENT: Negative for congestion and drooling.   Eyes: Negative for pain.  Respiratory: Negative for cough and shortness of breath.   Cardiovascular: Negative for chest pain.  Gastrointestinal: Negative for nausea, vomiting, abdominal pain and diarrhea.  Genitourinary: Negative for dysuria and hematuria.  Musculoskeletal: Negative for back pain, gait problem and neck pain.  Skin: Negative for color change.  Neurological: Positive for seizures. Negative for dizziness and headaches.  Hematological: Negative for adenopathy.  Psychiatric/Behavioral: Negative for behavioral problems.  All other systems reviewed and are negative.     Allergies   Morphine and related and Penicillins  Home Medications   Prior to Admission medications   Medication Sig Start Date End Date Taking? Authorizing Provider  acetaminophen (TYLENOL) 500 MG tablet Take 1,000 mg by mouth every 6 (six) hours as needed for moderate pain.     Historical Provider, MD  albuterol (PROVENTIL HFA;VENTOLIN HFA) 108 (90 BASE) MCG/ACT inhaler Inhale 2 puffs into the lungs every 6 (six) hours as needed for wheezing or shortness of breath.    Historical Provider, MD  divalproex (DEPAKOTE) 500 MG DR tablet Take 1 tablet (500 mg total) by mouth every 12 (twelve) hours. Patient not taking: Reported on 09/16/2014 03/06/14   [name], PA-C  divalproex (DEPAKOTE) 500 MG DR tablet Take 1 tablet (500 mg total) by mouth every 12 (twelve) hours. 08/18/14   Shuvon Rankin, NP  feeding supplement, ENSURE COMPLETE, (ENSURE COMPLETE) LIQD Take 237 mLs by mouth 2 (two) times daily between meals. Patient not taking: Reported on 09/16/2014 03/06/14   [name], PA-C  folic acid (FOLVITE) 1 MG tablet Take 1 tablet (1 mg total) by mouth daily. Patient not taking: Reported on 08/17/2014 03/06/14   [name], PA-C  hydrOXYzine (ATARAX/VISTARIL) 25 MG tablet Take 1 tablet (25 mg total) by mouth every 6 (six) hours as needed for itching. Patient not taking: Reported on 09/16/2014 11/09/13   [name], MD  levETIRAcetam (KEPPRA) 750 MG tablet Take 1 tablet (750 mg total) by mouth 2 (two) times daily. 03/06/14   [name], PA-C  LORazepam (ATIVAN) 1 MG tablet Take 1 tablet (  1 mg total) by mouth every 8 (eight) hours as needed for anxiety (agitation). Patient not taking: Reported on 09/16/2014 08/18/14   Shuvon Rankin, NP  LORazepam (ATIVAN) 2 MG tablet Take 0-2 tablets (0-4 mg total) by mouth every 6 (six) hours. Patient not taking: Reported on 09/16/2014 08/18/14   Shuvon Rankin, NP  Multiple Vitamin (MULTIVITAMIN WITH MINERALS) TABS tablet Take 1 tablet by mouth  daily. Patient not taking: Reported on 09/16/2014 11/09/13   [name], MD  nicotine (NICODERM CQ - DOSED IN MG/24 HOURS) 21 mg/24hr patch Place 1 patch (21 mg total) onto the skin daily. Patient not taking: Reported on 09/16/2014 08/18/14   Shuvon Rankin, NP  thiamine 100 MG tablet Take 1 tablet (100 mg total) by mouth daily. Patient not taking: Reported on 09/16/2014 03/06/14   [name], PA-C   BP 153/81 mmHg  Pulse 95  Temp(Src) 97.4 F (36.3 C) (Oral)  Resp 18  Wt 145 lb (65.772 kg)  SpO2 96% Physical Exam  Constitutional: She is oriented to person, place, and time. She appears well-developed and well-nourished.  HENT:  Head: Normocephalic.  Mouth/Throat: Oropharynx is clear and moist. No oropharyngeal exudate.  Eyes: Conjunctivae and EOM are normal. Pupils are equal, round, and reactive to light.  Neck: Normal range of motion. Neck supple.  Cardiovascular: Normal rate, regular rhythm, normal heart sounds and intact distal pulses.  Exam reveals no gallop and no friction rub.   No murmur heard. Pulmonary/Chest: Effort normal and breath sounds normal. No respiratory distress. She has no wheezes.  Abdominal: Soft. Bowel sounds are normal. There is no tenderness. There is no rebound and no guarding.  Musculoskeletal: Normal range of motion. She exhibits no edema or tenderness.  Neurological: She is alert and oriented to person, place, and time.  alert, oriented x3 speech: normal in context and clarity memory: intact grossly cranial nerves II-XII: intact motor strength: full proximally and distally no involuntary movements or tremors sensation: intact to light touch diffusely  cerebellar: finger-to-nose and heel-to-shin intact gait: normal forwards and backwards  Skin: Skin is warm and dry.  Psychiatric: She has a normal mood and affect. Her behavior is normal.  Nursing note and vitals reviewed.   ED Course  Procedures (including critical care time) Labs  Review Labs Reviewed  URINALYSIS, ROUTINE W REFLEX MICROSCOPIC - Abnormal; Notable for the following:    Leukocytes, UA TRACE (*)    All other components within normal limits  VALPROIC ACID LEVEL - Abnormal; Notable for the following:    Valproic Acid Lvl <10.0 (*)    All other components within normal limits  URINE RAPID DRUG SCREEN (HOSP PERFORMED) - Abnormal; Notable for the following:    Cocaine POSITIVE (*)    Tetrahydrocannabinol POSITIVE (*)    All other components within normal limits  I-STAT CHEM 8, ED - Abnormal; Notable for the following:    Hemoglobin 15.6 (*)    All other components within normal limits  URINE MICROSCOPIC-ADD ON    Imaging Review No results found.   EKG Interpretation   Date/Time:  Sunday November 17 2014 19:52:20 EST Ventricular Rate:  92 PR Interval:  141 QRS Duration: 79 QT Interval:  379 QTC Calculation: 469 R Axis:   49 Text Interpretation:  Sinus rhythm Consider left ventricular hypertrophy  No significant change since last tracing Confirmed by Angelis Gates  MD,  [place] ([id number]) on 11/17/2014 8:00:47 PM      MDM   Final diagnoses:  Seizure  8:16 PM 59 y.o. female with a history of seizures on Depakote who presents with a seizure. EMS is reporting the patient had multiple seizures in a row and has been out of her Depakote for 2 days. She is afebrile and vital signs are unremarkable here. She appears postictal on exam and is a poor historian. She is able to follow commands. We'll get screening lab work and Depakote level.  10:57 PM: I interpreted/reviewed the labs and/or imaging which were non-contributory.  Pt has a normal neuro exam, back to baseline. Given depakote here. She has been out of her meds for 2 days which is likely the cause of her seizure. I provided a prescription for Depakote. I have discussed the diagnosis/risks/treatment options with the patient and believe the pt to be eligible for discharge home to follow-up with her  neurologist next week. We also discussed returning to the ED immediately if new or worsening sx occur. We discussed the sx which are most concerning (e.g., recurrent seizures, HA, fever) that necessitate immediate return. Medications administered to the patient during their visit and any new prescriptions provided to the patient are listed below.  Medications given during this visit Medications  sodium chloride 0.9 % bolus 1,000 mL (0 mLs Intravenous Stopped 11/17/14 2229)  divalproex (DEPAKOTE) DR tablet 500 mg (500 mg Oral Given 11/17/14 2229)    New Prescriptions   DIVALPROEX (DEPAKOTE) 500 MG DR TABLET    Take 1 tablet (500 mg total) by mouth 2 (two) times daily.     [name], MD 11/17/14 [id number]

## 2014-11-17 NOTE — Discharge Instructions (Signed)
Seizure, Adult °A seizure is abnormal electrical activity in the brain. Seizures usually last from 30 seconds to 2 minutes. There are various types of seizures. °Before a seizure, you may have a warning sensation (aura) that a seizure is about to occur. An aura may include the following symptoms:  °· Fear or anxiety. °· Nausea. °· Feeling like the room is spinning (vertigo). °· Vision changes, such as seeing flashing lights or spots. °Common symptoms during a seizure include: °· A change in attention or behavior (altered mental status). °· Convulsions with rhythmic jerking movements. °· Drooling. °· Rapid eye movements. °· Grunting. °· Loss of bladder and bowel control. °· Bitter taste in the mouth. °· Tongue biting. °After a seizure, you may feel confused and sleepy. You may also have an injury resulting from convulsions during the seizure. °HOME CARE INSTRUCTIONS  °· If you are given medicines, take them exactly as prescribed by your health care provider. °· Keep all follow-up appointments as directed by your health care provider. °· Do not swim or drive or engage in risky activity during which a seizure could cause further injury to you or others until your health care provider says it is OK. °· Get adequate rest. °· Teach friends and family what to do if you have a seizure. They should: °· Lay you on the ground to prevent a fall. °· Put a cushion under your head. °· Loosen any tight clothing around your neck. °· Turn you on your side. If vomiting occurs, this helps keep your airway clear. °· Stay with you until you recover. °· Know whether or not you need emergency care. °SEEK IMMEDIATE MEDICAL CARE IF: °· The seizure lasts longer than 5 minutes. °· The seizure is severe or you do not wake up immediately after the seizure. °· You have an altered mental status after the seizure. °· You are having more frequent or worsening seizures. °Someone should drive you to the emergency department or call local emergency  services (911 in [place] °MAKE SURE YOU: °· Understand these instructions. °· Will watch your condition. °· Will get help right away if you are not doing well or get worse. °Document Released: 09/10/2000 Document Revised: 07/04/2013 Document Reviewed: 04/25/2013 °Exit[employer]® Patient Information ©2015 [employer]. This information is not intended to replace advice given to you by your health care provider. Make sure you discuss any questions you have with your health care provider. ° °Driving and Equipment Restrictions °Some medical problems make it dangerous to drive, ride a bike, or use machines. Some of these problems are: °· A hard blow to the head (concussion). °· Passing out (fainting). °· Twitching and shaking (seizures). °· Low blood sugar. °· Taking medicine to help you relax (sedatives). °· Taking pain medicines. °· Wearing an eye patch. °· Wearing splints. This can make it hard to use parts of your body that you need to drive safely. °HOME CARE  °· Do not drive until your doctor says it is okay. °· Do not use machines until your doctor says it is okay. °You may need a form signed by your doctor (medical release) before you can drive again. You may also need this form before you do other tasks where you need to be fully alert. °MAKE SURE YOU: °· Understand these instructions. °· Will watch your condition. °· Will get help right away if you are not doing well or get worse. °Document Released: 10/21/2004 Document Revised: 12/06/2011 Document Reviewed: 01/21/2010 °Exit[employer]® Patient Information ©2015 [employer].   This information is not intended to replace advice given to you by your health care provider. Make sure you discuss any questions you have with your health care provider. ° °

## 2014-11-17 NOTE — ED Notes (Signed)
Bed: BJ62 Expected date: 11/17/14 Expected time: 7:20 PM Means of arrival: Ambulance Comments: Seizures

## 2014-12-18 ENCOUNTER — Encounter (HOSPITAL_COMMUNITY): Payer: Self-pay | Admitting: *Deleted

## 2014-12-18 ENCOUNTER — Emergency Department (HOSPITAL_COMMUNITY)
Admission: EM | Admit: 2014-12-18 | Discharge: 2014-12-19 | Disposition: A | Discharge status: Home / Self Care | Payer: Medicaid Other | Attending: Emergency Medicine | Admitting: Emergency Medicine

## 2014-12-18 DIAGNOSIS — G40909 Epilepsy, unspecified, not intractable, without status epilepticus: Secondary | ICD-10-CM | POA: Diagnosis not present

## 2014-12-18 DIAGNOSIS — Z880 Allergy status to penicillin: Secondary | ICD-10-CM | POA: Diagnosis not present

## 2014-12-18 DIAGNOSIS — Z79899 Other long term (current) drug therapy: Secondary | ICD-10-CM | POA: Diagnosis not present

## 2014-12-18 DIAGNOSIS — J45909 Unspecified asthma, uncomplicated: Secondary | ICD-10-CM | POA: Insufficient documentation

## 2014-12-18 DIAGNOSIS — R569 Unspecified convulsions: Secondary | ICD-10-CM | POA: Diagnosis present

## 2014-12-18 LAB — CBG MONITORING, ED: Glucose-Capillary: 72 mg/dL (ref 70–99)

## 2014-12-18 MED ORDER — LORAZEPAM 1 MG PO TABS
1.0000 mg | ORAL_TABLET | Freq: Once | ORAL | Status: AC
  Administered 2014-12-18: 1 mg via ORAL
  Filled 2014-12-18: qty 1

## 2014-12-18 NOTE — ED Notes (Signed)
Bed: VZ56 Expected date: 12/18/14 Expected time: 7:32 PM Means of arrival: Ambulance Comments: EMS  Seizure

## 2014-12-18 NOTE — ED Notes (Signed)
Pt. CBG 72, RN,Kim made aware.

## 2014-12-18 NOTE — ED Notes (Signed)
Pt reports she did not have any seizures today - states "I was just dreaming, my kids called EMS b/c they saw me sweating" pt states she takes her Depakote everyday however did not today yet b/c she fell asleep.

## 2014-12-18 NOTE — ED Notes (Signed)
Attempted a second blood drawer, unsuccessful Lindsey [name], [facility] made aware.

## 2014-12-18 NOTE — ED Notes (Signed)
Upon attempting an IV start pt appears agitated and states "They already stuck me!" - rationale for IV and blood work explained to pt, pt allowed this RN to apply tourniquet and attempt to look for an PIV site, while lightly touching the patient w/ one index finger pt began to yell out stating "that hurts! That Hurts!" - pt refusing PIV start at this time. Pt educated again on indication for PIV and blood work. Phlebotomy made aware of this RN unable to obtain blood work.

## 2014-12-18 NOTE — ED Notes (Signed)
Per [facility] - pt from home w/ c/o seizures - pt w/ hx of same, per family pt has been noncompliant w/ her Depakote. Pt w/ witnessed grandmal seizures approx 1-75min in duration. Pt A&Ox4 en route, refused EMS IV start.

## 2014-12-18 NOTE — ED Provider Notes (Signed)
CSN: [id number]     Arrival date & time 12/18/14  1944 History   First MD Initiated Contact with Patient 12/18/14 1946     Chief Complaint  Patient presents with  . Seizures     (Consider location/radiation/quality/duration/timing/severity/associated sxs/prior Treatment) HPI Comments: Patient presents to the ED with a chief complaint of seizures.  Patient has a history of seizures.  She has a history of non-compliance.  She states that she has seizures when she gets agitated.  States that she had a death in the family recently and this is what she believes triggered her seizure.  She denies any fevers, chills, nausea, vomiting, diarrhea, or constipation.  Denies any injuries.  Denies any cough or dysuria.  The history is provided by the patient. No language interpreter was used.    Past Medical History  Diagnosis Date  . Seizures   . Asthma    Past Surgical History  Procedure Laterality Date  . Abdominal hysterectomy     History reviewed. No pertinent family history. History  Substance Use Topics  . Smoking status: Never Smoker   . Smokeless tobacco: Never Used  . Alcohol Use: Yes     Comment: drinks heavily. Agrees with drinking every other day.    OB History    No data available     Review of Systems  Constitutional: Negative for fever and chills.  Respiratory: Negative for shortness of breath.   Cardiovascular: Negative for chest pain.  Gastrointestinal: Negative for nausea, vomiting, diarrhea and constipation.  Genitourinary: Negative for dysuria.  Neurological: Positive for seizures.  All other systems reviewed and are negative.     Allergies  Morphine and related and Penicillins  Home Medications   Prior to Admission medications   Medication Sig Start Date End Date Taking? Authorizing Provider  acetaminophen (TYLENOL) 500 MG tablet Take 1,000 mg by mouth every 6 (six) hours as needed for moderate pain.     Historical Provider, MD  albuterol (PROVENTIL  HFA;VENTOLIN HFA) 108 (90 BASE) MCG/ACT inhaler Inhale 2 puffs into the lungs every 6 (six) hours as needed for wheezing or shortness of breath.    Historical Provider, MD  divalproex (DEPAKOTE) 500 MG DR tablet Take 1 tablet (500 mg total) by mouth every 12 (twelve) hours. Patient not taking: Reported on 09/16/2014 03/06/14   [name], PA-C  divalproex (DEPAKOTE) 500 MG DR tablet Take 1 tablet (500 mg total) by mouth every 12 (twelve) hours. Patient not taking: Reported on 12/18/2014 08/18/14   [name], NP  divalproex (DEPAKOTE) 500 MG DR tablet Take 1 tablet (500 mg total) by mouth 2 (two) times daily. 11/17/14   [name], MD  feeding supplement, ENSURE COMPLETE, (ENSURE COMPLETE) LIQD Take 237 mLs by mouth 2 (two) times daily between meals. Patient not taking: Reported on 09/16/2014 03/06/14   [name], PA-C  folic acid (FOLVITE) 1 MG tablet Take 1 tablet (1 mg total) by mouth daily. Patient not taking: Reported on 08/17/2014 03/06/14   [name], PA-C  hydrOXYzine (ATARAX/VISTARIL) 25 MG tablet Take 1 tablet (25 mg total) by mouth every 6 (six) hours as needed for itching. Patient not taking: Reported on 09/16/2014 11/09/13   [name], MD  lactose free nutrition (BOOST) LIQD Take 237 mLs by mouth 3 (three) times daily between meals.    Historical Provider, MD  levETIRAcetam (KEPPRA) 750 MG tablet Take 1 tablet (750 mg total) by mouth 2 (two) times daily. 03/06/14  [name], PA-C  LORazepam (ATIVAN) 1 MG tablet Take 1 tablet (1 mg total) by mouth every 8 (eight) hours as needed for anxiety (agitation). Patient not taking: Reported on 09/16/2014 08/18/14   [name], NP  LORazepam (ATIVAN) 2 MG tablet Take 0-2 tablets (0-4 mg total) by mouth every 6 (six) hours. Patient not taking: Reported on 09/16/2014 08/18/14   [name], NP  Multiple Vitamin (MULTIVITAMIN WITH MINERALS) TABS tablet Take 1 tablet by mouth daily. Patient not taking:  Reported on 09/16/2014 11/09/13   [name], MD  nicotine (NICODERM CQ - DOSED IN MG/24 HOURS) 21 mg/24hr patch Place 1 patch (21 mg total) onto the skin daily. Patient not taking: Reported on 09/16/2014 08/18/14   [name], NP  thiamine 100 MG tablet Take 1 tablet (100 mg total) by mouth daily. Patient not taking: Reported on 09/16/2014 03/06/14   Haynes Dage L York, PA-C   BP 171/103 mmHg  Pulse 120  Temp(Src) 98.1 F (36.7 C) (Oral)  Resp 18  SpO2 98% Physical Exam  Constitutional: She is oriented to person, place, and time. She appears well-developed and well-nourished.  HENT:  Head: Normocephalic and atraumatic.  No evidence of oral trauma  Eyes: Conjunctivae and EOM are normal. Pupils are equal, round, and reactive to light.  Neck: Normal range of motion. Neck supple.  Cardiovascular: Normal rate and regular rhythm.  Exam reveals no gallop and no friction rub.   No murmur heard. Pulmonary/Chest: Effort normal and breath sounds normal. No respiratory distress. She has no wheezes. She has no rales. She exhibits no tenderness.  Abdominal: Soft. Bowel sounds are normal. She exhibits no distension and no mass. There is no tenderness. There is no rebound and no guarding.  Musculoskeletal: Normal range of motion. She exhibits no edema or tenderness.  Neurological: She is alert and oriented to person, place, and time.  Skin: Skin is warm and dry.  Psychiatric: She has a normal mood and affect. Her behavior is normal. Judgment and thought content normal.  Nursing note and vitals reviewed.   ED Course  Procedures (including critical care time) Results for orders placed or performed during the hospital encounter of 12/18/14  CBG monitoring, ED  Result Value Ref Range   Glucose-Capillary 72 70 - 99 mg/dL   Comment 1 Notify RN    No results found.     EKG Interpretation None      MDM   Final diagnoses:  Seizure    Patient with history of seizures and medical  non-compliance.  Will check labs and reassess.  Patient refuses to give labs.  Will give some ativan and try again.  Patient still unwilling to give blood.  Patient has a long history of non-compliance.  No symptoms to suggest infection.  Patient states that she is ready to go home, but still unwilling to give blood.  Patient discussed with Dr. Rogene Houston.  Will discharge patient.  Instructed patient to resume taking her depakote, which she says she has.  Recommend follow-up with PCP/neurology.   Filed Vitals:   12/18/14 2335  BP: 144/86  Pulse: 91  Temp: 98 F (36.7 C)  Resp: [address] PA-C 12/19/14 0037  Fredia Sorrow, MD 12/19/14 [phone]

## 2014-12-18 NOTE — ED Notes (Signed)
[place], RN attempted to draw blood from patient however pt yelled out and jerked away during blood draw. Pt continues to refuse and further attempts for PIV or blood draw. Herbie Baltimore, [facility] made aware.

## 2014-12-19 MED ORDER — DIVALPROEX SODIUM 500 MG PO DR TAB
500.0000 mg | DELAYED_RELEASE_TABLET | Freq: Two times a day (BID) | ORAL | Status: DC
  Administered 2014-12-19: 500 mg via ORAL
  Filled 2014-12-19: qty 1

## 2014-12-19 NOTE — ED Notes (Signed)
Pt states she does not know her daughters phone number or her address - states she no longer lives at the [place] address listed in our system - pt states she lives in [place]. Attempted to call phone numbers listed in pt's chart however no answer.

## 2014-12-19 NOTE — ED Notes (Signed)
Pt's daughter, Ms Berenice Primas called asked if she was able to come pick up pt. She stated that she didn't have transportation and would call me back.

## 2014-12-19 NOTE — ED Notes (Signed)
Unable to get in contact w/ pt's daughter, unable to provide pt w/ a safe ride home. Per discussion w/ Sheffield Slider, Charge RN and Clarise Cruz [facility] RN - pt to hold until AM for social worker consult.

## 2014-12-19 NOTE — Discharge Instructions (Signed)

## 2014-12-19 NOTE — ED Notes (Signed)
Pt's daughter called back and stated that she was able to find a ride.

## 2015-01-18 ENCOUNTER — Emergency Department (HOSPITAL_COMMUNITY): Payer: Medicaid Other

## 2015-01-18 ENCOUNTER — Inpatient Hospital Stay (HOSPITAL_COMMUNITY)
Admission: EM | Admit: 2015-01-18 | Discharge: 2015-01-19 | DRG: 100 | Disposition: A | Discharge status: Home / Self Care | Payer: Medicaid Other | Attending: Internal Medicine | Admitting: Internal Medicine

## 2015-01-18 ENCOUNTER — Encounter (HOSPITAL_COMMUNITY): Payer: Self-pay | Admitting: Emergency Medicine

## 2015-01-18 DIAGNOSIS — F1210 Cannabis abuse, uncomplicated: Secondary | ICD-10-CM | POA: Diagnosis present

## 2015-01-18 DIAGNOSIS — Z79899 Other long term (current) drug therapy: Secondary | ICD-10-CM

## 2015-01-18 DIAGNOSIS — J45909 Unspecified asthma, uncomplicated: Secondary | ICD-10-CM | POA: Diagnosis present

## 2015-01-18 DIAGNOSIS — F1010 Alcohol abuse, uncomplicated: Secondary | ICD-10-CM | POA: Diagnosis present

## 2015-01-18 DIAGNOSIS — R569 Unspecified convulsions: Secondary | ICD-10-CM | POA: Diagnosis not present

## 2015-01-18 DIAGNOSIS — G40901 Epilepsy, unspecified, not intractable, with status epilepticus: Secondary | ICD-10-CM | POA: Diagnosis not present

## 2015-01-18 DIAGNOSIS — F1910 Other psychoactive substance abuse, uncomplicated: Secondary | ICD-10-CM

## 2015-01-18 DIAGNOSIS — Z9114 Patient's other noncompliance with medication regimen: Secondary | ICD-10-CM | POA: Diagnosis present

## 2015-01-18 DIAGNOSIS — G9340 Encephalopathy, unspecified: Secondary | ICD-10-CM | POA: Diagnosis not present

## 2015-01-18 DIAGNOSIS — N390 Urinary tract infection, site not specified: Secondary | ICD-10-CM

## 2015-01-18 DIAGNOSIS — E872 Acidosis: Secondary | ICD-10-CM | POA: Diagnosis present

## 2015-01-18 DIAGNOSIS — F1410 Cocaine abuse, uncomplicated: Secondary | ICD-10-CM | POA: Diagnosis present

## 2015-01-18 DIAGNOSIS — F1290 Cannabis use, unspecified, uncomplicated: Secondary | ICD-10-CM | POA: Diagnosis present

## 2015-01-18 DIAGNOSIS — G40301 Generalized idiopathic epilepsy and epileptic syndromes, not intractable, with status epilepticus: Secondary | ICD-10-CM | POA: Diagnosis present

## 2015-01-18 DIAGNOSIS — I10 Essential (primary) hypertension: Secondary | ICD-10-CM | POA: Diagnosis present

## 2015-01-18 DIAGNOSIS — R509 Fever, unspecified: Secondary | ICD-10-CM | POA: Diagnosis not present

## 2015-01-18 LAB — CBC WITH DIFFERENTIAL/PLATELET
BASOS ABS: 0 10*3/uL (ref 0.0–0.1)
BASOS PCT: 0 % (ref 0–1)
Eosinophils Absolute: 0 10*3/uL (ref 0.0–0.7)
Eosinophils Relative: 0 % (ref 0–5)
HCT: 39.4 % (ref 36.0–46.0)
HEMOGLOBIN: 13.1 g/dL (ref 12.0–15.0)
LYMPHS ABS: 1 10*3/uL (ref 0.7–4.0)
LYMPHS PCT: 12 % (ref 12–46)
MCH: 29.7 pg (ref 26.0–34.0)
MCHC: 33.2 g/dL (ref 30.0–36.0)
MCV: 89.3 fL (ref 78.0–100.0)
MONOS PCT: 8 % (ref 3–12)
Monocytes Absolute: 0.7 10*3/uL (ref 0.1–1.0)
NEUTROS ABS: 7 10*3/uL (ref 1.7–7.7)
NEUTROS PCT: 80 % — AB (ref 43–77)
Platelets: 368 10*3/uL (ref 150–400)
RBC: 4.41 MIL/uL (ref 3.87–5.11)
RDW: 14.3 % (ref 11.5–15.5)
WBC: 8.8 10*3/uL (ref 4.0–10.5)

## 2015-01-18 LAB — RAPID URINE DRUG SCREEN, HOSP PERFORMED
Amphetamines: NOT DETECTED
BARBITURATES: NOT DETECTED
BENZODIAZEPINES: NOT DETECTED
Cocaine: POSITIVE — AB
Opiates: NOT DETECTED
TETRAHYDROCANNABINOL: POSITIVE — AB

## 2015-01-18 LAB — URINALYSIS, ROUTINE W REFLEX MICROSCOPIC
BILIRUBIN URINE: NEGATIVE
GLUCOSE, UA: NEGATIVE mg/dL
HGB URINE DIPSTICK: NEGATIVE
Ketones, ur: NEGATIVE mg/dL
Leukocytes, UA: NEGATIVE
Nitrite: NEGATIVE
PH: 5 (ref 5.0–8.0)
Protein, ur: 30 mg/dL — AB
SPECIFIC GRAVITY, URINE: 1.017 (ref 1.005–1.030)
Urobilinogen, UA: 0.2 mg/dL (ref 0.0–1.0)

## 2015-01-18 LAB — COMPREHENSIVE METABOLIC PANEL
ALT: 10 U/L (ref 0–35)
ANION GAP: 11 (ref 5–15)
AST: 21 U/L (ref 0–37)
Albumin: 3.6 g/dL (ref 3.5–5.2)
Alkaline Phosphatase: 99 U/L (ref 39–117)
BILIRUBIN TOTAL: 0.7 mg/dL (ref 0.3–1.2)
BUN: 10 mg/dL (ref 6–23)
CHLORIDE: 105 mmol/L (ref 96–112)
CO2: 23 mmol/L (ref 19–32)
CREATININE: 0.97 mg/dL (ref 0.50–1.10)
Calcium: 9.4 mg/dL (ref 8.4–10.5)
GFR calc Af Amer: 74 mL/min — ABNORMAL LOW (ref >90)
GFR calc non Af Amer: 64 mL/min — ABNORMAL LOW (ref >90)
Glucose, Bld: 104 mg/dL — ABNORMAL HIGH (ref 70–99)
POTASSIUM: 3.7 mmol/L (ref 3.5–5.1)
SODIUM: 139 mmol/L (ref 135–145)
Total Protein: 6.6 g/dL (ref 6.0–8.3)

## 2015-01-18 LAB — URINE MICROSCOPIC-ADD ON

## 2015-01-18 LAB — LACTIC ACID, PLASMA: Lactic Acid, Venous: 1.4 mmol/L (ref 0.5–2.0)

## 2015-01-18 LAB — I-STAT TROPONIN, ED: TROPONIN I, POC: 0 ng/mL (ref 0.00–0.08)

## 2015-01-18 LAB — I-STAT CG4 LACTIC ACID, ED: LACTIC ACID, VENOUS: 2.22 mmol/L — AB (ref 0.5–2.0)

## 2015-01-18 LAB — VALPROIC ACID LEVEL: Valproic Acid Lvl: <10 ug/mL — ABNORMAL LOW (ref 50.0–100.0)

## 2015-01-18 LAB — ETHANOL: Alcohol, Ethyl (B): <5 mg/dL (ref 0–9)

## 2015-01-18 MED ORDER — ONDANSETRON HCL 4 MG/2ML IJ SOLN: 4.0000 mg | Freq: Four times a day (QID) | INTRAMUSCULAR | Status: DC | PRN

## 2015-01-18 MED ORDER — VITAMIN B-1 100 MG PO TABS
100.0000 mg | ORAL_TABLET | Freq: Every day | ORAL | Status: DC
  Administered 2015-01-19: 100 mg via ORAL
  Filled 2015-01-18: qty 1

## 2015-01-18 MED ORDER — SODIUM CHLORIDE 0.9 % IV SOLN
750.0000 mg | Freq: Two times a day (BID) | INTRAVENOUS | Status: DC
  Administered 2015-01-18 – 2015-01-19 (×2): 750 mg via INTRAVENOUS
  Filled 2015-01-18 (×4): qty 7.5

## 2015-01-18 MED ORDER — SODIUM CHLORIDE 0.9 % IV BOLUS (SEPSIS)
1000.0000 mL | Freq: Once | INTRAVENOUS | Status: AC
  Administered 2015-01-18: 1000 mL via INTRAVENOUS

## 2015-01-18 MED ORDER — HYDRALAZINE HCL 20 MG/ML IJ SOLN: 10.0000 mg | Freq: Four times a day (QID) | INTRAMUSCULAR | Status: DC | PRN

## 2015-01-18 MED ORDER — VALPROATE SODIUM 500 MG/5ML IV SOLN
750.0000 mg | Freq: Two times a day (BID) | INTRAVENOUS | Status: DC
  Administered 2015-01-19: 750 mg via INTRAVENOUS
  Filled 2015-01-18 (×3): qty 7.5

## 2015-01-18 MED ORDER — THIAMINE HCL 100 MG/ML IJ SOLN
100.0000 mg | Freq: Every day | INTRAMUSCULAR | Status: DC
  Administered 2015-01-18: 100 mg via INTRAVENOUS
  Filled 2015-01-18: qty 2

## 2015-01-18 MED ORDER — SODIUM CHLORIDE 0.9 % IJ SOLN: 3.0000 mL | Freq: Two times a day (BID) | INTRAMUSCULAR | Status: DC

## 2015-01-18 MED ORDER — ONDANSETRON HCL 4 MG PO TABS: 4.0000 mg | ORAL_TABLET | Freq: Four times a day (QID) | ORAL | Status: DC | PRN

## 2015-01-18 MED ORDER — SODIUM CHLORIDE 0.9 % IV SOLN
1000.0000 mg | Freq: Once | INTRAVENOUS | Status: AC
  Administered 2015-01-18: 1000 mg via INTRAVENOUS
  Filled 2015-01-18: qty 10

## 2015-01-18 MED ORDER — SODIUM CHLORIDE 0.9 % IV SOLN
INTRAVENOUS | Status: DC
  Administered 2015-01-18 – 2015-01-19 (×2): via INTRAVENOUS
  Filled 2015-01-18 (×5): qty 1000

## 2015-01-18 MED ORDER — LEVETIRACETAM 500 MG PO TABS
1000.0000 mg | ORAL_TABLET | Freq: Once | ORAL | Status: DC
  Filled 2015-01-18: qty 2

## 2015-01-18 MED ORDER — THIAMINE HCL 100 MG/ML IJ SOLN
100.0000 mg | Freq: Once | INTRAMUSCULAR | Status: AC
  Administered 2015-01-18: 100 mg via INTRAVENOUS
  Filled 2015-01-18: qty 2

## 2015-01-18 MED ORDER — ENOXAPARIN SODIUM 40 MG/0.4ML ~~LOC~~ SOLN
40.0000 mg | SUBCUTANEOUS | Status: DC
  Administered 2015-01-18: 40 mg via SUBCUTANEOUS
  Filled 2015-01-18 (×2): qty 0.4

## 2015-01-18 MED ORDER — ACETAMINOPHEN 325 MG PO TABS
650.0000 mg | ORAL_TABLET | Freq: Four times a day (QID) | ORAL | Status: DC | PRN
  Administered 2015-01-19: 650 mg via ORAL
  Filled 2015-01-18: qty 2

## 2015-01-18 MED ORDER — ACETAMINOPHEN 650 MG RE SUPP
650.0000 mg | RECTAL | Status: DC | PRN
  Administered 2015-01-18: 650 mg via RECTAL
  Filled 2015-01-18: qty 1

## 2015-01-18 MED ORDER — THIAMINE HCL 100 MG/ML IJ SOLN: 100.0000 mg | Freq: Every day | INTRAMUSCULAR | Status: DC

## 2015-01-18 MED ORDER — ADULT MULTIVITAMIN W/MINERALS CH
1.0000 | ORAL_TABLET | Freq: Every day | ORAL | Status: DC
  Administered 2015-01-19: 1 via ORAL
  Filled 2015-01-18: qty 1

## 2015-01-18 MED ORDER — DEXTROSE 5 % IV SOLN
1.0000 g | Freq: Once | INTRAVENOUS | Status: AC
  Administered 2015-01-18: 1 g via INTRAVENOUS
  Filled 2015-01-18: qty 10

## 2015-01-18 MED ORDER — LORAZEPAM 2 MG/ML IJ SOLN: 1.0000 mg | Freq: Four times a day (QID) | INTRAMUSCULAR | Status: DC | PRN

## 2015-01-18 MED ORDER — FOLIC ACID 1 MG PO TABS
1.0000 mg | ORAL_TABLET | Freq: Every day | ORAL | Status: DC
  Administered 2015-01-19: 1 mg via ORAL
  Filled 2015-01-18: qty 1

## 2015-01-18 MED ORDER — ACETAMINOPHEN 650 MG RE SUPP: 650.0000 mg | Freq: Four times a day (QID) | RECTAL | Status: DC | PRN

## 2015-01-18 MED ORDER — LORAZEPAM 1 MG PO TABS: 1.0000 mg | ORAL_TABLET | Freq: Four times a day (QID) | ORAL | Status: DC | PRN

## 2015-01-18 MED ORDER — LORAZEPAM 2 MG/ML IJ SOLN
2.0000 mg | Freq: Once | INTRAMUSCULAR | Status: AC
  Administered 2015-01-18: 2 mg via INTRAMUSCULAR
  Filled 2015-01-18: qty 1

## 2015-01-18 MED ORDER — VALPROATE SODIUM 500 MG/5ML IV SOLN
1.0000 g | Freq: Once | INTRAVENOUS | Status: AC
  Administered 2015-01-18: 1000 mg via INTRAVENOUS
  Filled 2015-01-18: qty 10
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
## 2015-01-18 NOTE — ED Notes (Signed)
Lindsey Rocker, PA aware of abnormal lab test results

## 2015-01-18 NOTE — ED Notes (Signed)
Per [facility], attempting to get pt bed assignement, re-assigned to a different room. Will call back
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
## 2015-01-18 NOTE — ED Provider Notes (Signed)
Medical screening examination/treatment/procedure(s) were conducted as a shared visit with non-physician practitioner(s) or resident  and myself.  I personally evaluated the patient during the encounter and agree with the findings.   I have personally reviewed any xrays and/ or EKG's with the provider and I agree with interpretation.    Patient with history of malnutrition, alcohol abuse, seizures, noncompliance with medication presents after 3 generalized seizures. Reportedly similar to previous. Patient's mental status worse in the ER, on recheck patient had mild asymmetry in pupils , mild agitation decreased verbal response, CT scan ordered to look for signs of bleeding or swelling. , patient increased verbal response after CT scan ,  No meningismus, no fever,pupils responsive bilateral with mild increased size left pupil, horizontal eye movement intact, difficult neuro exam as patient is mild agitation , difficulty understanding verbal response mostly 1 or 2 words, mild combative. If CT scan unremarkable concern for subclinical status  epilepticus , IV  Seizure medicines, close monitoring and neuro consult in ER.  CRITICAL CARE Performed by: Mariea Clonts   Total critical care time: 40 min Critical care time was exclusive of separately billable procedures and treating other patients.  Critical care was necessary to treat or prevent imminent or life-threatening deterioration.  Critical care was time spent personally by me on the following activities: development of treatment plan with patient and/or surrogate as well as nursing, discussions with consultants, evaluation of patient's response to treatment, examination of patient, obtaining history from patient or surrogate, ordering and performing treatments and interventions, ordering and review of laboratory studies, ordering and review of radiographic studies, pulse oximetry and re-evaluation of patient's condition.   Seizures, altered mental  status  Elnora Morrison, MD 01/21/15 [phone]
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
## 2015-01-18 NOTE — ED Notes (Signed)
Hospitalist at bedside 

## 2015-01-18 NOTE — ED Provider Notes (Signed)
CSN: [id number]     Arrival date & time 01/18/15  0554 History   First MD Initiated Contact with Patient 01/18/15 [phone]     Chief Complaint  Patient presents with  . Seizures     (Consider location/radiation/quality/duration/timing/severity/associated sxs/prior Treatment) HPI Lindsey [name] is a 59 y.o. female with hx of seizure disorder, asthma, alcohol abuse, presents by EMS with reported seizure. Pt reportedly had 3 seizures, no family at bedside at this time, pt unable to provide much history other than she has a headache. She also admits to not taking her medications, unable to tell for how long, states she cannot afford them. Pt refusing to answer any other questions.   Past Medical History  Diagnosis Date  . Seizures   . Asthma    Past Surgical History  Procedure Laterality Date  . Abdominal hysterectomy     History reviewed. No pertinent family history. History  Substance Use Topics  . Smoking status: Never Smoker   . Smokeless tobacco: Never Used  . Alcohol Use: Yes     Comment: drinks heavily. Agrees with drinking every other day.    OB History    No data available     Review of Systems  Unable to perform ROS: Mental status change      Allergies  Morphine and related and Penicillins  Home Medications   Prior to Admission medications   Medication Sig Start Date End Date Taking? Authorizing Provider  acetaminophen (TYLENOL) 500 MG tablet Take 1,000 mg by mouth every 6 (six) hours as needed for moderate pain.     Historical Provider, MD  albuterol (PROVENTIL HFA;VENTOLIN HFA) 108 (90 BASE) MCG/ACT inhaler Inhale 2 puffs into the lungs every 6 (six) hours as needed for wheezing or shortness of breath.    Historical Provider, MD  divalproex (DEPAKOTE) 500 MG DR tablet Take 1 tablet (500 mg total) by mouth every 12 (twelve) hours. Patient not taking: Reported on 09/16/2014 03/06/14   [name], PA-C  divalproex (DEPAKOTE) 500 MG DR tablet Take 1 tablet (500  mg total) by mouth every 12 (twelve) hours. Patient not taking: Reported on 12/18/2014 08/18/14   [name], NP  divalproex (DEPAKOTE) 500 MG DR tablet Take 1 tablet (500 mg total) by mouth 2 (two) times daily. 11/17/14   [name], MD  feeding supplement, ENSURE COMPLETE, (ENSURE COMPLETE) LIQD Take 237 mLs by mouth 2 (two) times daily between meals. Patient not taking: Reported on 09/16/2014 03/06/14   [name], PA-C  folic acid (FOLVITE) 1 MG tablet Take 1 tablet (1 mg total) by mouth daily. Patient not taking: Reported on 08/17/2014 03/06/14   [name], PA-C  hydrOXYzine (ATARAX/VISTARIL) 25 MG tablet Take 1 tablet (25 mg total) by mouth every 6 (six) hours as needed for itching. Patient not taking: Reported on 09/16/2014 11/09/13   [name], MD  lactose free nutrition (BOOST) LIQD Take 237 mLs by mouth 3 (three) times daily between meals.    Historical Provider, MD  levETIRAcetam (KEPPRA) 750 MG tablet Take 1 tablet (750 mg total) by mouth 2 (two) times daily. 03/06/14   [name], PA-C  LORazepam (ATIVAN) 1 MG tablet Take 1 tablet (1 mg total) by mouth every 8 (eight) hours as needed for anxiety (agitation). Patient not taking: Reported on 09/16/2014 08/18/14   [name], NP  LORazepam (ATIVAN) 2 MG tablet Take 0-2 tablets (0-4 mg total) by mouth every 6 (six) hours. Patient  not taking: Reported on 09/16/2014 08/18/14   [name], NP  Multiple Vitamin (MULTIVITAMIN WITH MINERALS) TABS tablet Take 1 tablet by mouth daily. Patient not taking: Reported on 09/16/2014 11/09/13   [name], MD  nicotine (NICODERM CQ - DOSED IN MG/24 HOURS) 21 mg/24hr patch Place 1 patch (21 mg total) onto the skin daily. Patient not taking: Reported on 09/16/2014 08/18/14   [name], NP  thiamine 100 MG tablet Take 1 tablet (100 mg total) by mouth daily. Patient not taking: Reported on 09/16/2014 03/06/14   [name], PA-C   BP 193/100 mmHg   Pulse 97  Temp(Src) 98.4 F (36.9 C) (Axillary)  Resp 25  Ht 5\' 8"  (1.727 m)  Wt 145 lb (65.772 kg)  BMI 22.05 kg/m2  SpO2 98% Physical Exam  Constitutional: She appears well-developed and well-nourished.  HENT:  Head: Normocephalic.  Eyes: Conjunctivae are normal.  Pupils pin point, reactive  Neck: Normal range of motion. Neck supple.  Cardiovascular: Normal rate, regular rhythm and normal heart sounds.   Pulmonary/Chest: Effort normal and breath sounds normal. No respiratory distress. She has no wheezes. She has no rales.  Abdominal: Soft. Bowel sounds are normal. She exhibits no distension. There is no tenderness. There is no rebound.  Musculoskeletal: She exhibits no edema.  Neurological: She is alert.  Pt is moving all extremities. She is somnolent, but arousable to voice. Refuses to answer most of the questions or follow directions. She is able to hold arms up and legs up with no pronator drift. Refused to squeeze my hands.   Skin: Skin is warm and dry.  Nursing note and vitals reviewed.   ED Course  Procedures (including critical care time) Labs Review Labs Reviewed  CBC WITH DIFFERENTIAL/PLATELET  COMPREHENSIVE METABOLIC PANEL  URINE RAPID DRUG SCREEN (HOSP PERFORMED)  VALPROIC ACID LEVEL  URINALYSIS, ROUTINE W REFLEX MICROSCOPIC  ETHANOL  I-STAT TROPOININ, ED    Imaging Review No results found.   EKG Interpretation   Date/Time:  Saturday January 18 2015 05:55:11 EDT Ventricular Rate:  94 PR Interval:  167 QRS Duration: 80 QT Interval:  390 QTC Calculation: 488 R Axis:   72 Text Interpretation:  Sinus rhythm No significant change since last  tracing Confirmed by Glynn Octave [phone]) on 01/18/2015 6:00:54 AM      MDM   Final diagnoses:  Status epilepticus  Acute encephalopathy  UTI (lower urinary tract infection)  Polysubstance abuse    Pt with reported seizures at home x3? Hx of seizure disorder, also alcohol withdrawal seizures. At this  time, pt is possibly post ictal vs intoxicated vs encephalopathic. She is answering some of the questions, she is somnolent, moving all extremities and no apparent neuro deficits. Will get labs, will monitor.   7:26 AM Was called by a nurse into the room. Pt is not following any directions now at all. She will say "what do you want" but otherwise not answering any question. Pt is protecting her airway. Normal gag reflex. Also now noted left pupil mid dilated, not reactive. CT head ordered.   9:35 AM Pt seen by neurology. Will admit to [facility].   Filed Vitals:   01/18/15 1031 01/18/15 1045 01/18/15 1203 01/18/15 1420  BP: 165/77 169/74 141/52 138/70  Pulse: 85 87 100 102  Temp:   97.9 F (36.6 C) 99.6 F (37.6 C)  TempSrc:   Axillary Axillary  Resp: 19 19 20 20   Height:  Weight:      SpO2: 98% 98% 98% 99%     Jeannett Senior, PA-C 01/18/15 1618  Everlene Balls, MD 01/19/15 0254

## 2015-01-18 NOTE — ED Notes (Signed)
Upon entering room pt not responding to my questions, not following commands, pupils were apparently both pinpoint for prior nurse Santiago Glad, now left pupil is 4 mm nonreactive, right pupil remains pinpoint. PA Tatyana aware and at bedside to assess pt.

## 2015-01-18 NOTE — ED Notes (Signed)
While in CT pt removed IV on her own, on attempt of new IV placement pt very combative and uncooperative. PA aware.
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
## 2015-01-18 NOTE — Progress Notes (Signed)
Patient admitted from ER, patient lethargic but responds to pain. Patient made comfortable.No Family at bed side at this time.

## 2015-01-18 NOTE — Consult Note (Signed)
Consult Reason for Consult:breakthrough seizures and altered mental status Referring Physician: Dr Reather Converse  CC: altered mental status  HPI: Lindsey [name] is an 59 y.o. female with history of seizures, medication noncompliance and substance abuse presenting with breakthrough seizures. Per records, patient had 3 seizure episodes. In ED noted to have worsening of her mental status. Remains argumentative and combative. Complaining of headache. She was given 1000mg  of Keppra in the ED.   VPA level < 10, UDS + for cocaine and THC, UA shows many bacteria. No leukocytosis, normal Na and Ca. CT head imaging reviewed and overall unremarkable.   Past Medical History  Diagnosis Date  . Seizures   . Asthma     Past Surgical History  Procedure Laterality Date  . Abdominal hysterectomy      History reviewed. No pertinent family history.  Social History:  reports that she has never smoked. She has never used smokeless tobacco. She reports that she drinks alcohol. She reports that she uses illicit drugs (Marijuana).  Allergies  Allergen Reactions  . Morphine And Related Hives  . Penicillins Nausea Only    Medications: I have reviewed the patient's current medications.  ROS: Out of a complete 14 system review, the patient complains of only the following symptoms, and all other reviewed systems are negative. +cold, headache  Physical Examination: Filed Vitals:   01/18/15 0806  BP:   Pulse:   Temp: 100.7 F (38.2 C)  Resp:    Physical Exam  Constitutional: He appears well-developed and well-nourished.  Psych: Affect appropriate to situation Eyes: No scleral injection HENT: No OP obstrucion Head: Normocephalic.  Cardiovascular: Normal rate and regular rhythm.  Respiratory: Effort normal and breath sounds normal.  GI: Soft. Bowel sounds are normal. No distension. There is no tenderness.  Skin: WDI  Neurologic Examination Mental Status: Lethargic, eyes closed but will  briefly open to voice and noxious stimuli. Oriented to name only, states "leave me alone, stop talking to me". Pushes examiner away. Not following commands Cranial Nerves: II: unable to visualize optic discs, blinks to threat bilaterally, pupils equal, round, reactive to light III,IV, VI: ptosis not present, doesn't track but appears to move eyes fully. No gaze deviation or preference V,VII: face symmetric VIII: hearing normal bilaterally IX,X: gag reflex present XI: unable to test XII: unable to test  Motor: Due to mental status unable to formally test but appears to move all extremities symmetrically and against light resistance Sensory: withdrawals to noxious stimuli in all 4 extremities Deep Tendon Reflexes: 2+ and symmetric throughout Plantars: Right: downgoing   Left: downgoing Cerebellar: Unable to test Gait: unable to test  Laboratory Studies:   Basic Metabolic Panel:  Recent Labs Lab 01/18/15 0610  NA 139  K 3.7  CL 105  CO2 23  GLUCOSE 104*  BUN 10  CREATININE 0.97  CALCIUM 9.4    Liver Function Tests:  Recent Labs Lab 01/18/15 0610  AST 21  ALT 10  ALKPHOS 99  BILITOT 0.7  PROT 6.6  ALBUMIN 3.6   No results for input(s): LIPASE, AMYLASE in the last 168 hours. No results for input(s): AMMONIA in the last 168 hours.  CBC:  Recent Labs Lab 01/18/15 0610  WBC 8.8  NEUTROABS 7.0  HGB 13.1  HCT 39.4  MCV 89.3  PLT 368    Cardiac Enzymes: No results for input(s): CKTOTAL, CKMB, CKMBINDEX, TROPONINI in the last 168 hours.  BNP: Invalid input(s): POCBNP  CBG: No results for input(s): GLUCAP in the  last 168 hours.  Microbiology: Results for orders placed or performed during the hospital encounter of 03/04/14  Culture, blood (routine x 2)     Status: None   Collection Time: 03/04/14  5:10 PM  Result Value Ref Range Status   Specimen Description BLOOD ARM LEFT  Final   Special Requests BOTTLES DRAWN AEROBIC AND ANAEROBIC B 10CC R 5CC  Final    Culture  Setup Time   Final    03/04/2014 22:47 Performed at [facility]   Culture   Final    NO GROWTH 5 DAYS Performed at [facility]   Report Status 03/10/2014 FINAL  Final  Culture, blood (routine x 2)     Status: None   Collection Time: 03/04/14  5:15 PM  Result Value Ref Range Status   Specimen Description BLOOD HAND LEFT  Final   Special Requests BOTTLES DRAWN AEROBIC ONLY 10CC  Final   Culture  Setup Time   Final    03/04/2014 22:46 Performed at [facility]   Culture   Final    NO GROWTH 5 DAYS Performed at [facility]   Report Status 03/10/2014 FINAL  Final  Clostridium Difficile by PCR     Status: None   Collection Time: 03/05/14  3:37 PM  Result Value Ref Range Status   C difficile by pcr NEGATIVE NEGATIVE Final    Coagulation Studies: No results for input(s): LABPROT, INR in the last 72 hours.  Urinalysis:  Recent Labs Lab 01/18/15 0625  COLORURINE YELLOW  LABSPEC 1.017  PHURINE 5.0  GLUCOSEU NEGATIVE  HGBUR NEGATIVE  BILIRUBINUR NEGATIVE  KETONESUR NEGATIVE  PROTEINUR 30*  UROBILINOGEN 0.2  NITRITE NEGATIVE  LEUKOCYTESUR NEGATIVE    Lipid Panel:  No results found for: CHOL, TRIG, HDL, CHOLHDL, VLDL, LDLCALC  HgbA1C: No results found for: HGBA1C  Urine Drug Screen:     Component Value Date/Time   LABOPIA NONE DETECTED 01/18/2015 0625   COCAINSCRNUR POSITIVE* 01/18/2015 0625   LABBENZ NONE DETECTED 01/18/2015 0625   AMPHETMU NONE DETECTED 01/18/2015 0625   THCU POSITIVE* 01/18/2015 0625   LABBARB NONE DETECTED 01/18/2015 0625    Alcohol Level:  Recent Labs Lab 01/18/15 0610  ETH <5    Other results:  Imaging: Ct Head Wo Contrast  01/18/2015   CLINICAL DATA:  Seizures x 3 Hx of seizures Pt is not cooperative and does not follow commands Pt Very combative during transport from stretcher to/from table Pt pulled her IV out after scan Unable to ask medical questions  EXAM: CT HEAD WITHOUT CONTRAST   TECHNIQUE: Contiguous axial images were obtained from the base of the skull through the vertex without intravenous contrast.  COMPARISON:  03/04/2014  FINDINGS: Ventricles are normal in size and configuration. There are no parenchymal masses or mass effect. There is no evidence of an infarct. There are no extra-axial masses or abnormal fluid collections.  No intracranial hemorrhage.  Some dependent secretions in the left sphenoid sinus. Remaining visualized sinuses are clear. Clear mastoid air cells. No skull lesion.  IMPRESSION: No acute intracranial abnormalities.   Electronically Signed   By: Lajean Manes M.D.   On: 01/18/2015 08:31     Assessment/Plan:  59y/o woman with known seizure disorder, medication noncompliance and substance abuse presenting to ED with breakthrough seizures and prolonged post ictal confusion. CT head unremarkable. Lab workup pertinent for + cocaine, + UA and VPA level less than 10. Breakthrough seizures likely related to medication noncompliance, substance  abuse and underlying UTI. Patient remains confused but able to converse making NCSE less likely.   -load with keppra 1000mg  and VPA 1000mg   -restart keppra 750mg  Q12 and VPA 500mg  Q12 -will check VPA level in the morning -seizure precautions -UTI treatment per primary team -will hold on EEG at this time and monitor mental status. Based on exam not consistent with NCSE.  -will continue to follow   [name], DO [facility] [phone]  If 7pm- 7am, please page neurology on call as listed in [place]. 01/18/2015, 9:07 AM

## 2015-01-18 NOTE — ED Notes (Addendum)
Pt brought to ED by [facility] for c/o seizures x 3 at home, no seizures witness by EMS pt denies any dizziness, nausea or vomiting. Pt is not cooperative during assessment and very combative, refusing to have oral temp taking. Pt out of her medication, because family can't afford them.

## 2015-01-19 LAB — BASIC METABOLIC PANEL
Anion gap: 11 (ref 5–15)
BUN: 5 mg/dL — ABNORMAL LOW (ref 6–23)
CALCIUM: 8.8 mg/dL (ref 8.4–10.5)
CHLORIDE: 111 mmol/L (ref 96–112)
CO2: 20 mmol/L (ref 19–32)
Creatinine, Ser: 0.86 mg/dL (ref 0.50–1.10)
GFR calc Af Amer: 85 mL/min — ABNORMAL LOW (ref >90)
GFR, EST NON AFRICAN AMERICAN: 74 mL/min — AB (ref >90)
GLUCOSE: 85 mg/dL (ref 70–99)
POTASSIUM: 3.8 mmol/L (ref 3.5–5.1)
Sodium: 142 mmol/L (ref 135–145)

## 2015-01-19 LAB — HIV ANTIBODY (ROUTINE TESTING W REFLEX): HIV SCREEN 4TH GENERATION: NONREACTIVE

## 2015-01-19 LAB — VALPROIC ACID LEVEL: Valproic Acid Lvl: 69 ug/mL (ref 50.0–100.0)

## 2015-01-19 MED ORDER — DIVALPROEX SODIUM 500 MG PO DR TAB: 500.0000 mg | DELAYED_RELEASE_TABLET | Freq: Two times a day (BID) | ORAL | Status: DC

## 2015-01-19 MED ORDER — ENSURE COMPLETE PO LIQD: 237.0000 mL | Freq: Two times a day (BID) | ORAL | Status: DC

## 2015-01-19 MED ORDER — LEVETIRACETAM 750 MG PO TABS: 750.0000 mg | ORAL_TABLET | Freq: Two times a day (BID) | ORAL | Status: DC

## 2015-01-19 MED ORDER — ALBUTEROL SULFATE HFA 108 (90 BASE) MCG/ACT IN AERS: 2.0000 | INHALATION_SPRAY | Freq: Four times a day (QID) | RESPIRATORY_TRACT | Status: DC | PRN

## 2015-01-19 MED ORDER — FOLIC ACID 1 MG PO TABS: 1.0000 mg | ORAL_TABLET | Freq: Every day | ORAL | Status: DC

## 2015-01-19 NOTE — Discharge Summary (Signed)
PATIENT DETAILS Name: Lindsey [name] Age: 59 y.o. Sex: female Date of Birth: 06/30/1956 MRN: [id number]. Admitting Physician: Orson Eva, MD [id number], Sabino Gasser, MD  Admit Date: 01/18/2015 Discharge date: 01/19/2015  Recommendations for Outpatient Follow-up:  1. Needs ongoing counseling regarding compliance to medications and to avoid cocaine/ETOH/marijuana use.  2.  Prelim blood cultures neg at time of discharge-please follow till final.  PRIMARY DISCHARGE DIAGNOSIS:  Active Problems:   Seizure   Encephalopathy   Fever   Acute encephalopathy   Polysubstance abuse      PAST MEDICAL HISTORY: Past Medical History  Diagnosis Date  . Seizures   . Asthma     DISCHARGE MEDICATIONS: Current Discharge Medication List    CONTINUE these medications which have CHANGED   Details  albuterol (PROVENTIL HFA;VENTOLIN HFA) 108 (90 BASE) MCG/ACT inhaler Inhale 2 puffs into the lungs every 6 (six) hours as needed for wheezing or shortness of breath. Qty: 18 g, Refills: 0    divalproex (DEPAKOTE) 500 MG DR tablet Take 1 tablet (500 mg total) by mouth every 12 (twelve) hours. Qty: 60 tablet, Refills: 0    feeding supplement, ENSURE COMPLETE, (ENSURE COMPLETE) LIQD Take 237 mLs by mouth 2 (two) times daily between meals. Qty: 60 Bottle, Refills: 0    folic acid (FOLVITE) 1 MG tablet Take 1 tablet (1 mg total) by mouth daily. Qty: 30 tablet, Refills: 0    levETIRAcetam (KEPPRA) 750 MG tablet Take 1 tablet (750 mg total) by mouth 2 (two) times daily. Qty: 60 tablet, Refills: 0      CONTINUE these medications which have NOT CHANGED   Details  acetaminophen (TYLENOL) 500 MG tablet Take 1,000 mg by mouth every 6 (six) hours as needed for moderate pain.       STOP taking these medications     hydrOXYzine (ATARAX/VISTARIL) 25 MG tablet      LORazepam (ATIVAN) 1 MG tablet      LORazepam (ATIVAN) 2 MG tablet      Multiple Vitamin (MULTIVITAMIN WITH MINERALS) TABS tablet      nicotine (NICODERM CQ - DOSED IN MG/24 HOURS) 21 mg/24hr patch      thiamine 100 MG tablet         ALLERGIES:   Allergies  Allergen Reactions  . Morphine And Related Hives  . Penicillins Nausea Only    BRIEF HPI:  See H&P, Labs, Consult and Test reports for all details in brief, patient is a 58 year old female with a history of polysubstance abuse, seizure disorder, tobacco abuse presented by EMS after 3 witnessed seizures at home.  CONSULTATIONS:   neurology  PERTINENT RADIOLOGIC STUDIES: Ct Head Wo Contrast  01/18/2015   CLINICAL DATA:  Seizures x 3 Hx of seizures Pt is not cooperative and does not follow commands Pt Very combative during transport from stretcher to/from table Pt pulled her IV out after scan Unable to ask medical questions  EXAM: CT HEAD WITHOUT CONTRAST  TECHNIQUE: Contiguous axial images were obtained from the base of the skull through the vertex without intravenous contrast.  COMPARISON:  03/04/2014  FINDINGS: Ventricles are normal in size and configuration. There are no parenchymal masses or mass effect. There is no evidence of an infarct. There are no extra-axial masses or abnormal fluid collections.  No intracranial hemorrhage.  Some dependent secretions in the left sphenoid sinus. Remaining visualized sinuses are clear. Clear mastoid air cells. No skull lesion.  IMPRESSION: No acute intracranial abnormalities.   Electronically Signed   By:  Lajean Manes M.D.   On: 01/18/2015 08:31   Dg Chest Portable 1 View  01/18/2015   CLINICAL DATA:  Seizures.  EXAM: PORTABLE CHEST - 1 VIEW  COMPARISON:  03/04/2014  FINDINGS: Cardiac silhouette is normal in size and configuration. Aorta is mildly uncoiled. No mediastinal or hilar masses or evidence of adenopathy. Clear lungs. No pleural effusion or pneumothorax.  Bony thorax is grossly intact.  IMPRESSION: No active disease.   Electronically Signed   By: Lajean Manes M.D.   On: 01/18/2015 09:16     PERTINENT LAB  RESULTS: CBC:  Recent Labs  01/18/15 0610  WBC 8.8  HGB 13.1  HCT 39.4  PLT 368   CMET CMP     Component Value Date/Time   NA 142 01/19/2015 0719   NA 139 09/15/2012 1146   K 3.8 01/19/2015 0719   K 4.2 09/15/2012 1146   CL 111 01/19/2015 0719   CL 110* 09/15/2012 1146   CO2 20 01/19/2015 0719   CO2 18* 09/15/2012 1146   GLUCOSE 85 01/19/2015 0719   GLUCOSE 74 09/15/2012 1146   BUN 5* 01/19/2015 0719   BUN 12 09/15/2012 1146   CREATININE 0.86 01/19/2015 0719   CREATININE 0.92 09/15/2012 1146   CALCIUM 8.8 01/19/2015 0719   CALCIUM 8.8 09/15/2012 1146   PROT 6.6 01/18/2015 0610   PROT 7.7 09/15/2012 1146   ALBUMIN 3.6 01/18/2015 0610   ALBUMIN 3.4 09/15/2012 1146   AST 21 01/18/2015 0610   AST 30 09/15/2012 1146   ALT 10 01/18/2015 0610   ALT 19 09/15/2012 1146   ALKPHOS 99 01/18/2015 0610   ALKPHOS 126 09/15/2012 1146   BILITOT 0.7 01/18/2015 0610   GFRNONAA 74* 01/19/2015 0719   GFRNONAA >60 09/15/2012 1146   GFRAA 85* 01/19/2015 0719   GFRAA >60 09/15/2012 1146    GFR Estimated Creatinine Clearance: 72.8 mL/min (by C-G formula based on Cr of 0.86). No results for input(s): LIPASE, AMYLASE in the last 72 hours. No results for input(s): CKTOTAL, CKMB, CKMBINDEX, TROPONINI in the last 72 hours. Invalid input(s): POCBNP No results for input(s): DDIMER in the last 72 hours. No results for input(s): HGBA1C in the last 72 hours. No results for input(s): CHOL, HDL, LDLCALC, TRIG, CHOLHDL, LDLDIRECT in the last 72 hours. No results for input(s): TSH, T4TOTAL, T3FREE, THYROIDAB in the last 72 hours.  Invalid input(s): FREET3 No results for input(s): VITAMINB12, FOLATE, FERRITIN, TIBC, IRON, RETICCTPCT in the last 72 hours. Coags: No results for input(s): INR in the last 72 hours.  Invalid input(s): PT Microbiology: Recent Results (from the past 240 hour(s))  Blood culture (routine x 2)     Status: None (Preliminary result)   Collection Time: 01/18/15  9:10  AM  Result Value Ref Range Status   Specimen Description BLOOD RIGHT ARM  Final   Special Requests BOTTLES DRAWN AEROBIC AND ANAEROBIC 5CC  Final   Culture   Final           BLOOD CULTURE RECEIVED NO GROWTH TO DATE CULTURE WILL BE HELD FOR 5 DAYS BEFORE ISSUING A FINAL NEGATIVE REPORT Performed at [facility]    Report Status PENDING  Incomplete  Blood culture (routine x 2)     Status: None (Preliminary result)   Collection Time: 01/18/15  9:16 AM  Result Value Ref Range Status   Specimen Description BLOOD LEFT HAND  Final   Special Requests BOTTLES DRAWN AEROBIC ONLY 3CC  Final   Culture  Final           BLOOD CULTURE RECEIVED NO GROWTH TO DATE CULTURE WILL BE HELD FOR 5 DAYS BEFORE ISSUING A FINAL NEGATIVE REPORT Note: Culture results may be compromised due to an inadequate volume of blood received in culture bottles. Performed at [place] COURSE:   Active Problems: Acute encephalopathy:Likely due to prolonged post ictal state. Now awake and alert at time of discharge.   Breakthrough Seizures:secondary to patient's substance abuse and noncompliance.Neurology was consulted, resumed Keppra and Depakote. No further seizures since admission. Counseled extensively regarding compliance and life disabling/threatening effects of status epilepticus. This MD also spoke with patient's daughter (after patients permission) and explained importance of compliance. Patient was instructed-No driving, no participating in activities at heights, no high speed water sports till cleared by your primary MD or primary Neurologist  Cocaine/Marijuana and ETOH abuse:counseled extensively.   Fever: low grade fever on admission, likely non infectious-likely from post ictal state. Prelim blood cultures neg at time of discharge. Will need PCP to follow till final.UA neg for UTI.   TODAY-DAY OF DISCHARGE:  Subjective:   Dani Wallner today has no headache,no chest abdominal pain,no new weakness tingling or numbness, feels much better wants to go home today.  Objective:   Blood pressure 142/84, pulse 74, temperature 98 F (36.7 C), temperature source Oral, resp. rate 18, height 5\' 8"  (1.727 m), weight 65.772 kg (145 lb), SpO2 100 %.  Intake/Output Summary (Last 24 hours) at 01/19/15 1023 Last data filed at 01/18/15 1700  Gross per 24 hour  Intake      0 ml  Output      0 ml  Net      0 ml   Filed Weights   01/18/15 0558  Weight: 65.772 kg (145 lb)    Exam Awake Alert, Oriented *3, No new F.N deficits, Normal affect .AT,PERRAL Supple Neck,No JVD, No cervical lymphadenopathy appriciated.  Symmetrical Chest wall movement, Good air movement bilaterally, CTAB RRR,No Gallops,Rubs or new Murmurs, No Parasternal Heave +ve B.Sounds, Abd Soft, Non tender, No organomegaly appriciated, No rebound -guarding or rigidity. No Cyanosis, Clubbing or edema, No new Rash or bruise  DISCHARGE CONDITION: Stable  DISPOSITION: Home  DISCHARGE INSTRUCTIONS:    Activity:  As tolerated with Full fall precautions use walker/cane & assistance as needed  Diet recommendation: Regular Diet   Discharge Instructions    Call MD for:    Complete by:  As directed   Recurrent seizure     Diet - low sodium heart healthy    Complete by:  As directed      Increase activity slowly    Complete by:  As directed      Other Restrictions    Complete by:  As directed   No driving, no participating in activities at heights, no high speed water sports till cleared by your primary MD or primary Neurologist           Follow-up Information    Follow up with [name], MD. Schedule an appointment as soon as possible for a visit in 1 week.   Specialty:  Family Medicine   Contact information:   [place] [address] [phone]         Total Time spent on discharge equals 25  minutes.  SignedOren Binet 01/19/2015 10:23 AM

## 2015-01-19 NOTE — Discharge Instructions (Signed)
No driving, no participating in activities at heights, no high speed water sports till cleared by your primary MD or primary Neurologist

## 2015-01-19 NOTE — Progress Notes (Signed)
Subjective: Patient more alert and interactive this morning. Reports having lumbar and LE pain.   VPA level 69  Objective: Current vital signs: BP 143/74 mmHg  Pulse 80  Temp(Src) 97.4 F (36.3 C) (Oral)  Resp 18  Ht 5\' 8"  (1.727 m)  Wt 65.772 kg (145 lb)  BMI 22.05 kg/m2  SpO2 100% Vital signs in last 24 hours: Temp:  [97.4 F (36.3 C)-99.6 F (37.6 C)] 97.4 F (36.3 C) (04/24 0657) Pulse Rate:  [80-102] 80 (04/24 0657) Resp:  [18-20] 18 (04/24 0657) BP: (126-187)/(52-95) 143/74 mmHg (04/24 0657) SpO2:  [96 %-100 %] 100 % (04/24 0657)  Intake/Output from previous day:   Intake/Output this shift:   Nutritional status: Diet regular Room service appropriate?: Yes; Fluid consistency:: Thin  Neurologic Exam: Mental Status: Asleep but easily aroused with voice. Oriented to name, hospital and date. No aphasia or dysarthria noted. Follows simple commands Cranial Nerves: II: pupils equal, round, reactive to light III,IV, VI: ptosis not present, EOMI V,VII: face symmetric VIII: hearing normal bilaterally Motor: Moves all extremities symmetrically and against light resistance Sensory: LT intact in all extremities  Lab Results: Basic Metabolic Panel:  Recent Labs Lab 01/18/15 0610  NA 139  K 3.7  CL 105  CO2 23  GLUCOSE 104*  BUN 10  CREATININE 0.97  CALCIUM 9.4    Liver Function Tests:  Recent Labs Lab 01/18/15 0610  AST 21  ALT 10  ALKPHOS 99  BILITOT 0.7  PROT 6.6  ALBUMIN 3.6   No results for input(s): LIPASE, AMYLASE in the last 168 hours. No results for input(s): AMMONIA in the last 168 hours.  CBC:  Recent Labs Lab 01/18/15 0610  WBC 8.8  NEUTROABS 7.0  HGB 13.1  HCT 39.4  MCV 89.3  PLT 368    Cardiac Enzymes: No results for input(s): CKTOTAL, CKMB, CKMBINDEX, TROPONINI in the last 168 hours.  Lipid Panel: No results for input(s): CHOL, TRIG, HDL, CHOLHDL, VLDL, LDLCALC in the last 168 hours.  CBG: No results for input(s):  GLUCAP in the last 168 hours.  Microbiology: Results for orders placed or performed during the hospital encounter of 01/18/15  Blood culture (routine x 2)     Status: None (Preliminary result)   Collection Time: 01/18/15  9:10 AM  Result Value Ref Range Status   Specimen Description BLOOD RIGHT ARM  Final   Special Requests BOTTLES DRAWN AEROBIC AND ANAEROBIC 5CC  Final   Culture   Final           BLOOD CULTURE RECEIVED NO GROWTH TO DATE CULTURE WILL BE HELD FOR 5 DAYS BEFORE ISSUING A FINAL NEGATIVE REPORT Performed at [facility]    Report Status PENDING  Incomplete  Blood culture (routine x 2)     Status: None (Preliminary result)   Collection Time: 01/18/15  9:16 AM  Result Value Ref Range Status   Specimen Description BLOOD LEFT HAND  Final   Special Requests BOTTLES DRAWN AEROBIC ONLY 3CC  Final   Culture   Final           BLOOD CULTURE RECEIVED NO GROWTH TO DATE CULTURE WILL BE HELD FOR 5 DAYS BEFORE ISSUING A FINAL NEGATIVE REPORT Note: Culture results may be compromised due to an inadequate volume of blood received in culture bottles. Performed at [facility]    Report Status PENDING  Incomplete    Coagulation Studies: No results for input(s): LABPROT, INR in the last 72 hours.  Imaging: Ct Head Wo Contrast  01/18/2015   CLINICAL DATA:  Seizures x 3 Hx of seizures Pt is not cooperative and does not follow commands Pt Very combative during transport from stretcher to/from table Pt pulled her IV out after scan Unable to ask medical questions  EXAM: CT HEAD WITHOUT CONTRAST  TECHNIQUE: Contiguous axial images were obtained from the base of the skull through the vertex without intravenous contrast.  COMPARISON:  03/04/2014  FINDINGS: Ventricles are normal in size and configuration. There are no parenchymal masses or mass effect. There is no evidence of an infarct. There are no extra-axial masses or abnormal fluid collections.  No intracranial hemorrhage.  Some  dependent secretions in the left sphenoid sinus. Remaining visualized sinuses are clear. Clear mastoid air cells. No skull lesion.  IMPRESSION: No acute intracranial abnormalities.   Electronically Signed   By: Lajean Manes M.D.   On: 01/18/2015 08:31   Dg Chest Portable 1 View  01/18/2015   CLINICAL DATA:  Seizures.  EXAM: PORTABLE CHEST - 1 VIEW  COMPARISON:  03/04/2014  FINDINGS: Cardiac silhouette is normal in size and configuration. Aorta is mildly uncoiled. No mediastinal or hilar masses or evidence of adenopathy. Clear lungs. No pleural effusion or pneumothorax.  Bony thorax is grossly intact.  IMPRESSION: No active disease.   Electronically Signed   By: Lajean Manes M.D.   On: 01/18/2015 09:16    Medications:  Scheduled: . enoxaparin (LOVENOX) injection  40 mg Subcutaneous Q24H  . folic acid  1 mg Oral Daily  . levETIRAcetam  750 mg Intravenous Q12H  . multivitamin with minerals  1 tablet Oral Daily  . sodium chloride  3 mL Intravenous Q12H  . thiamine  100 mg Oral Daily   Or  . thiamine  100 mg Intravenous Daily  . valproate sodium  750 mg Intravenous Q12H    Assessment/Plan: 58y/o woman with known seizure disorder, medication noncompliance and substance abuse presenting to ED with breakthrough seizures and prolonged post ictal confusion. CT head unremarkable. Lab workup pertinent for + cocaine, + UA and VPA level less than 10. Breakthrough seizures likely related to medication noncompliance, substance abuse. Appears back to baseline mental status this morning. VPA level 69.   -continue keppra 750mg  Q12 and VPA 500mg  Q12 -seizure precautions -no further inpatient neurological workup indicated at this time. Will need outpatient neurology follow up    LOS: 1 day   [name], DO [facility] [phone]  If 7pm- 7am, please page neurology on call as listed in [place]. 01/19/2015  8:11 AM

## 2015-01-19 NOTE — Social Work (Signed)
CSW met with patient about needs for transportation. Patient stated that she has 2 daughters but does not have a ride home. Patient stated that the ride she had previously to get here, could be made available. CSW offered a bus pass which patient was willing to accept. When RN helped patient walk, patient was unsteady, so CSW will provide taxi voucher.   Patient discharging, no further CSW needs.  Christene Lye MSW, [place]

## 2015-01-19 NOTE — Progress Notes (Signed)
Utilization review completed.  

## 2015-01-21 LAB — URINE CULTURE: Colony Count: >=100000

## 2015-01-24 LAB — CULTURE, BLOOD (ROUTINE X 2)
CULTURE: NO GROWTH
Culture: NO GROWTH

## 2015-01-25 LAB — CULTURE, BLOOD (ROUTINE X 2)
CULTURE: NO GROWTH
Culture: NO GROWTH

## 2015-02-06 ENCOUNTER — Inpatient Hospital Stay (HOSPITAL_COMMUNITY)
Admission: EM | Admit: 2015-02-06 | Discharge: 2015-02-07 | DRG: 100 | Disposition: A | Discharge status: Home / Self Care | Payer: Medicaid Other | Attending: Internal Medicine | Admitting: Internal Medicine

## 2015-02-06 ENCOUNTER — Inpatient Hospital Stay (HOSPITAL_COMMUNITY): Payer: Medicaid Other

## 2015-02-06 ENCOUNTER — Other Ambulatory Visit (HOSPITAL_COMMUNITY): Payer: Self-pay

## 2015-02-06 ENCOUNTER — Encounter (HOSPITAL_COMMUNITY): Payer: Self-pay | Admitting: Emergency Medicine

## 2015-02-06 DIAGNOSIS — I493 Ventricular premature depolarization: Secondary | ICD-10-CM | POA: Diagnosis present

## 2015-02-06 DIAGNOSIS — S0083XA Contusion of other part of head, initial encounter: Secondary | ICD-10-CM

## 2015-02-06 DIAGNOSIS — F1290 Cannabis use, unspecified, uncomplicated: Secondary | ICD-10-CM | POA: Diagnosis present

## 2015-02-06 DIAGNOSIS — G40909 Epilepsy, unspecified, not intractable, without status epilepticus: Principal | ICD-10-CM | POA: Diagnosis present

## 2015-02-06 DIAGNOSIS — R569 Unspecified convulsions: Secondary | ICD-10-CM | POA: Diagnosis present

## 2015-02-06 DIAGNOSIS — Z79899 Other long term (current) drug therapy: Secondary | ICD-10-CM

## 2015-02-06 DIAGNOSIS — F1910 Other psychoactive substance abuse, uncomplicated: Secondary | ICD-10-CM

## 2015-02-06 DIAGNOSIS — J45909 Unspecified asthma, uncomplicated: Secondary | ICD-10-CM | POA: Diagnosis present

## 2015-02-06 DIAGNOSIS — G9340 Encephalopathy, unspecified: Secondary | ICD-10-CM | POA: Diagnosis present

## 2015-02-06 DIAGNOSIS — Z9114 Patient's other noncompliance with medication regimen: Secondary | ICD-10-CM | POA: Diagnosis present

## 2015-02-06 DIAGNOSIS — R0602 Shortness of breath: Secondary | ICD-10-CM

## 2015-02-06 DIAGNOSIS — M25511 Pain in right shoulder: Secondary | ICD-10-CM

## 2015-02-06 DIAGNOSIS — G4089 Other seizures: Secondary | ICD-10-CM

## 2015-02-06 DIAGNOSIS — R52 Pain, unspecified: Secondary | ICD-10-CM

## 2015-02-06 LAB — CBC WITH DIFFERENTIAL/PLATELET
BASOS ABS: 0 10*3/uL (ref 0.0–0.1)
Basophils Relative: 0 % (ref 0–1)
Eosinophils Absolute: 0.1 10*3/uL (ref 0.0–0.7)
Eosinophils Relative: 1 % (ref 0–5)
HEMATOCRIT: 38.2 % (ref 36.0–46.0)
Hemoglobin: 12.8 g/dL (ref 12.0–15.0)
LYMPHS PCT: 40 % (ref 12–46)
Lymphs Abs: 2.8 10*3/uL (ref 0.7–4.0)
MCH: 29.8 pg (ref 26.0–34.0)
MCHC: 33.5 g/dL (ref 30.0–36.0)
MCV: 88.8 fL (ref 78.0–100.0)
Monocytes Absolute: 0.6 10*3/uL (ref 0.1–1.0)
Monocytes Relative: 8 % (ref 3–12)
NEUTROS ABS: 3.5 10*3/uL (ref 1.7–7.7)
Neutrophils Relative %: 51 % (ref 43–77)
PLATELETS: 313 10*3/uL (ref 150–400)
RBC: 4.3 MIL/uL (ref 3.87–5.11)
RDW: 14.3 % (ref 11.5–15.5)
WBC: 6.9 10*3/uL (ref 4.0–10.5)

## 2015-02-06 LAB — COMPREHENSIVE METABOLIC PANEL
ALT: 16 U/L (ref 14–54)
ANION GAP: 10 (ref 5–15)
AST: 23 U/L (ref 15–41)
Albumin: 3.9 g/dL (ref 3.5–5.0)
Alkaline Phosphatase: 106 U/L (ref 38–126)
BILIRUBIN TOTAL: 0.8 mg/dL (ref 0.3–1.2)
BUN: 15 mg/dL (ref 6–20)
CALCIUM: 9.4 mg/dL (ref 8.9–10.3)
CHLORIDE: 107 mmol/L (ref 101–111)
CO2: 22 mmol/L (ref 22–32)
CREATININE: 0.88 mg/dL (ref 0.44–1.00)
GFR calc Af Amer: >60 mL/min (ref >60)
GLUCOSE: 78 mg/dL (ref 65–99)
Potassium: 3.5 mmol/L (ref 3.5–5.1)
SODIUM: 139 mmol/L (ref 135–145)
Total Protein: 7.3 g/dL (ref 6.5–8.1)

## 2015-02-06 LAB — BASIC METABOLIC PANEL
ANION GAP: 10 (ref 5–15)
BUN: 10 mg/dL (ref 6–20)
CO2: 23 mmol/L (ref 22–32)
Calcium: 8.8 mg/dL — ABNORMAL LOW (ref 8.9–10.3)
Chloride: 106 mmol/L (ref 101–111)
Creatinine, Ser: 0.76 mg/dL (ref 0.44–1.00)
GFR calc non Af Amer: >60 mL/min (ref >60)
Glucose, Bld: 71 mg/dL (ref 65–99)
POTASSIUM: 4.2 mmol/L (ref 3.5–5.1)
Sodium: 139 mmol/L (ref 135–145)

## 2015-02-06 LAB — CBC
HEMATOCRIT: 38.7 % (ref 36.0–46.0)
Hemoglobin: 13.3 g/dL (ref 12.0–15.0)
MCH: 30.2 pg (ref 26.0–34.0)
MCHC: 34.4 g/dL (ref 30.0–36.0)
MCV: 88 fL (ref 78.0–100.0)
PLATELETS: 235 10*3/uL (ref 150–400)
RBC: 4.4 MIL/uL (ref 3.87–5.11)
RDW: 14.2 % (ref 11.5–15.5)
WBC: 7.4 10*3/uL (ref 4.0–10.5)

## 2015-02-06 LAB — ETHANOL

## 2015-02-06 LAB — GLUCOSE, CAPILLARY
Glucose-Capillary: 54 mg/dL — ABNORMAL LOW (ref 65–99)
Glucose-Capillary: 112 mg/dL — ABNORMAL HIGH (ref 65–99)
Glucose-Capillary: 85 mg/dL (ref 65–99)
Glucose-Capillary: 110 mg/dL — ABNORMAL HIGH (ref 65–99)

## 2015-02-06 LAB — CBG MONITORING, ED: Glucose-Capillary: 93 mg/dL (ref 65–99)

## 2015-02-06 LAB — VALPROIC ACID LEVEL: Valproic Acid Lvl: 29 ug/mL — ABNORMAL LOW (ref 50.0–100.0)

## 2015-02-06 LAB — MRSA PCR SCREENING: MRSA by PCR: NEGATIVE

## 2015-02-06 MED ORDER — DEXTROSE-NACL 5-0.9 % IV SOLN
INTRAVENOUS | Status: DC
Start: 2015-02-06 — End: 2015-02-07
  Administered 2015-02-06: 14:00:00 via INTRAVENOUS

## 2015-02-06 MED ORDER — ACETAMINOPHEN 325 MG PO TABS: 650.0000 mg | ORAL_TABLET | ORAL | Status: DC | PRN

## 2015-02-06 MED ORDER — LORAZEPAM 2 MG/ML IJ SOLN: 1.0000 mg | INTRAMUSCULAR | Status: DC | PRN

## 2015-02-06 MED ORDER — ONDANSETRON HCL 4 MG PO TABS: 4.0000 mg | ORAL_TABLET | Freq: Four times a day (QID) | ORAL | Status: DC | PRN

## 2015-02-06 MED ORDER — HEPARIN SODIUM (PORCINE) 5000 UNIT/ML IJ SOLN
5000.0000 [IU] | Freq: Three times a day (TID) | INTRAMUSCULAR | Status: DC
  Administered 2015-02-06 – 2015-02-07 (×4): 5000 [IU] via SUBCUTANEOUS
  Filled 2015-02-06 (×6): qty 1

## 2015-02-06 MED ORDER — DEXTROSE 50 % IV SOLN
25.0000 mL | Freq: Once | INTRAVENOUS | Status: AC
  Administered 2015-02-06: 25 mL via INTRAVENOUS

## 2015-02-06 MED ORDER — ACETAMINOPHEN 650 MG RE SUPP: 650.0000 mg | RECTAL | Status: DC | PRN

## 2015-02-06 MED ORDER — LEVETIRACETAM 500 MG/5ML IV SOLN
750.0000 mg | Freq: Two times a day (BID) | INTRAVENOUS | Status: DC
  Administered 2015-02-06 (×2): 750 mg via INTRAVENOUS
  Filled 2015-02-06 (×4): qty 7.5

## 2015-02-06 MED ORDER — CLINDAMYCIN PHOSPHATE 300 MG/50ML IV SOLN
300.0000 mg | Freq: Three times a day (TID) | INTRAVENOUS | Status: DC
Start: 2015-02-06 — End: 2015-02-07
  Administered 2015-02-06 – 2015-02-07 (×4): 300 mg via INTRAVENOUS
  Filled 2015-02-06 (×6): qty 50

## 2015-02-06 MED ORDER — ONDANSETRON HCL 4 MG/2ML IJ SOLN: 4.0000 mg | Freq: Four times a day (QID) | INTRAMUSCULAR | Status: DC | PRN

## 2015-02-06 MED ORDER — DEXTROSE 50 % IV SOLN
INTRAVENOUS | Status: AC
  Administered 2015-02-06: 25 mL via INTRAVENOUS
  Filled 2015-02-06: qty 50

## 2015-02-06 MED ORDER — THIAMINE HCL 100 MG/ML IJ SOLN
100.0000 mg | Freq: Every day | INTRAMUSCULAR | Status: DC
  Administered 2015-02-06 – 2015-02-07 (×2): 100 mg via INTRAVENOUS
  Filled 2015-02-06 (×2): qty 1

## 2015-02-06 MED ORDER — VALPROATE SODIUM 500 MG/5ML IV SOLN
500.0000 mg | Freq: Two times a day (BID) | INTRAVENOUS | Status: DC
  Administered 2015-02-06 (×2): 500 mg via INTRAVENOUS
  Filled 2015-02-06 (×5): qty 5

## 2015-02-06 MED ORDER — SODIUM CHLORIDE 0.9 % IV SOLN
75.0000 mL/h | INTRAVENOUS | Status: DC
  Administered 2015-02-06: 75 mL/h via INTRAVENOUS

## 2015-02-06 MED ORDER — VALPROATE SODIUM 500 MG/5ML IV SOLN
10.0000 mg/kg | Freq: Once | INTRAVENOUS | Status: AC
  Administered 2015-02-06: 590 mg via INTRAVENOUS
  Filled 2015-02-06: qty 5.9

## 2015-02-06 MED ORDER — LORAZEPAM 2 MG/ML IJ SOLN
1.0000 mg | Freq: Once | INTRAMUSCULAR | Status: AC
  Administered 2015-02-06: 1 mg via INTRAVENOUS
  Filled 2015-02-06: qty 1

## 2015-02-06 NOTE — H&P (Signed)
[facility] History and Physical  Patient: Lindsey [name]  MRN: [id number]  DOB: 09-13-1956  DOS: the patient was seen and examined on 02/06/2015 PCP: [name], MD  Referring physician: Dr. Roxanne Mins Chief Complaint: Seizure  HPI: Lindsey [name] is a 59 y.o. female with Past medical history of seizure, substance abuse. The patient is presenting with a seizure-like episode. The history is limited due to patient's condition. The patient as per documentation had around 3 seizures which are generalized tonic-clonic at home. Patient was initially admitted baseline in the ED. In the ED she had another episode of seizure with loss of her 1 minute. Patient has received lorazepam. The patient reportedly mentioned that she was compliant with all the medications.  The patient is coming from home And at her baseline independent for most of her ADL.  Review of Systems: as mentioned in the history of present illness.  A comprehensive review of the other systems is negative.  Past Medical History  Diagnosis Date  . Seizures   . Asthma    Past Surgical History  Procedure Laterality Date  . Abdominal hysterectomy     Social History:  reports that she has never smoked. She has never used smokeless tobacco. She reports that she drinks alcohol. She reports that she uses illicit drugs (Marijuana).  Allergies  Allergen Reactions  . Morphine And Related Hives  . Penicillins Nausea Only    No family history on file.  Prior to Admission medications   Medication Sig Start Date End Date Taking? Authorizing Provider  albuterol (PROVENTIL HFA;VENTOLIN HFA) 108 (90 BASE) MCG/ACT inhaler Inhale 2 puffs into the lungs every 6 (six) hours as needed for wheezing or shortness of breath. 01/19/15  Yes Shanker Kristeen Mans, MD  divalproex (DEPAKOTE) 500 MG DR tablet Take 1 tablet (500 mg total) by mouth every 12 (twelve) hours. 01/19/15  Yes Shanker Kristeen Mans, MD  feeding supplement, ENSURE  COMPLETE, (ENSURE COMPLETE) LIQD Take 237 mLs by mouth 2 (two) times daily between meals. 01/19/15  Yes Shanker Kristeen Mans, MD  folic acid (FOLVITE) 1 MG tablet Take 1 tablet (1 mg total) by mouth daily. 01/19/15  Yes Shanker Kristeen Mans, MD  levETIRAcetam (KEPPRA) 750 MG tablet Take 1 tablet (750 mg total) by mouth 2 (two) times daily. 01/19/15  Yes Shanker Kristeen Mans, MD  acetaminophen (TYLENOL) 500 MG tablet Take 1,000 mg by mouth every 6 (six) hours as needed for moderate pain.     Historical Provider, MD    Physical Exam: Filed Vitals:   02/06/15 0430 02/06/15 0515 02/06/15 0530 02/06/15 0531  BP: 141/84 140/73 129/70 129/70  Pulse: 78 84 81 81  Temp:      TempSrc:      Resp: 16 17 16 15   Height:      Weight:      SpO2: 100% 100% 100% 100%    General: Drowsy and does not follow command. Appear in mild distress Eyes: PERRL ENT: Oral Mucosa clear moist. Neck: no JVD Cardiovascular: S1 and S2 Present, no Murmur, Peripheral Pulses Present Respiratory: Bilateral Air entry equal and Decreased,  Occasional rhonchi, no Crackles, no wheezes Abdomen: Bowel Sound present, Soft and non tender Skin: no Rash Extremities: no Pedal edema, no calf tenderness Neurologic: Drowsy and does not follow command spontaneously movement of all extremity is. Withdraws to painful stimuli.  Labs on Admission:  CBC:  Recent Labs Lab 02/06/15 0349  WBC 6.9  NEUTROABS 3.5  HGB 12.8  HCT 38.2  MCV 88.8  PLT 313    CMP     Component Value Date/Time   NA 139 02/06/2015 0349   NA 137 01/28/2014 1826   K 3.5 02/06/2015 0349   K 4.8 01/28/2014 1826   CL 107 02/06/2015 0349   CL 105 01/28/2014 1826   CO2 22 02/06/2015 0349   CO2 23 01/28/2014 1826   GLUCOSE 78 02/06/2015 0349   GLUCOSE 82 01/28/2014 1826   BUN 15 02/06/2015 0349   BUN 15 01/28/2014 1826   CREATININE 0.88 02/06/2015 0349   CREATININE 0.77 01/28/2014 1826   CALCIUM 9.4 02/06/2015 0349   CALCIUM 9.4 01/28/2014 1826   PROT 7.3  02/06/2015 0349   PROT 8.2 01/28/2014 1826   ALBUMIN 3.9 02/06/2015 0349   ALBUMIN 3.9 01/28/2014 1826   AST 23 02/06/2015 0349   AST 38* 01/28/2014 1826   ALT 16 02/06/2015 0349   ALT 24 01/28/2014 1826   ALKPHOS 106 02/06/2015 0349   ALKPHOS 112 01/28/2014 1826   BILITOT 0.8 02/06/2015 0349   GFRNONAA >60 02/06/2015 0349   GFRNONAA >60 01/28/2014 1826   GFRAA >60 02/06/2015 0349   GFRAA >60 01/28/2014 1826    No results for input(s): LIPASE, AMYLASE in the last 168 hours.  No results for input(s): CKTOTAL, CKMB, CKMBINDEX, TROPONINI in the last 168 hours. BNP (last 3 results) No results for input(s): BNP in the last 8760 hours.  ProBNP (last 3 results) No results for input(s): PROBNP in the last 8760 hours.   Radiological Exams on Admission: No results found. Assessment/Plan Principal Problem:   Seizure secondary to subtherapeutic anticonvulsant medication Active Problems:   Acute encephalopathy   Polysubstance abuse   Recurrent seizures   1. Seizure secondary to subtherapeutic anticonvulsant medication The patient is presenting with seizure-like episode. Patient currently postictal. She will be admitted in stepdown unit. Most likely the seizure secondary to noncompliance with medication as her levels are subtherapeutic. Patient has been loaded with valproic acid. We will continue home medications in IV form. Lorazepam as needed. Seizure prophylaxis. Follow further workup.  Nothing by mouth until the patient is fully awake.  2. Right facial injury. History is limited and therefore unknown etiology. CT scan does not show any acute abnormality. Close monitoring.  Advance goals of care discussion: Full code presumed   Consults: ED physician discussed with Dr. Janann Colonel from neurology.  DVT Prophylaxis: subcutaneous Heparin Nutrition: Nothing by mouth  Disposition: Admitted as inpatient, step-down unit.  Author: Berle Mull, MD [facility] Pager:  [phone] 02/06/2015  If 7PM-7AM, please contact night-coverage [email or web address] Password [email or web address]

## 2015-02-06 NOTE — ED Notes (Signed)
Pt returned to room  

## 2015-02-06 NOTE — ED Notes (Addendum)
This RN attempting to suction pt. Found leaning to the left with drool coming from her mouth with oxygen saturation levels in the 70's. Dr. Roxanne Mins notified.

## 2015-02-06 NOTE — ED Notes (Signed)
Missed IV access by this RN. Iv team consulted.

## 2015-02-06 NOTE — ED Notes (Signed)
IV team at bedside 

## 2015-02-06 NOTE — ED Notes (Signed)
Dr. Glick at the bedside.  

## 2015-02-06 NOTE — Procedures (Signed)
History: 59 yo F with seizure disorder and breakthrough seizures.   Sedation: none  Technique: This is a 19 channel routine scalp EEG performed at the bedside with bipolar and monopolar montages arranged in accordance to the international 10/20 system of electrode placement. One channel was dedicated to EKG recording.    Background: The background consists of intermixed alpha and beta activities. There is a well defined posterior dominant rhythm of 9 - 10 Hz that attenuates with eye opening. Sleep is recorded with normal appearing structures. She has two left frontal epileptiform discharges.  Photic stimulation: Physiologic driving is not perofmre  EEG Abnormalities: 1) Left frontal sharp waves  Clinical Interpretation: This EEG is consistent with a potential seizure focus in the left frontal region. There was no seizure recorded on this study.   Roland Rack, MD [facility] [phone]  If 7pm- 7am, please page neurology on call as listed in [place].

## 2015-02-06 NOTE — ED Notes (Signed)
Patient transported to [place] with Primitivo Gauze RN

## 2015-02-06 NOTE — Progress Notes (Signed)
UR completed.  Suspected breakthrough seizures. Noted to have been independent with most ADLs prior to admission. CM to follow for any identified d/c needs.   Sandi Mariscal, RN BSN [place] [facility] Trauma/Neuro ICU Case Manager [phone]

## 2015-02-06 NOTE — Progress Notes (Signed)
[facility] PROGRESS NOTE  Lindsey [name] YIR:[id number] DOB: August 02, 1956 DOA: 02/06/2015 PCP: [name], MD  Assessment/Plan: 1-Seizure:  Seondary to subtherapeutic anticonvulsant medication Continue with Keppra and Valproate.  Ativan PRN for seizure.  Dr [name] to see patient.  Ct Head: No acute intracranial process.   2-AMS, Encephalopathy;  Related to seizure, UDS pending.   3-Facial injury. Right eye erythema. Patient unable to provide history at this time. Unclear if history of trauma. I will start empirically clindamycin to cover for preseptal cellulitis.   Code Status: Full Code.  Family Communication: None at bedside.  Disposition Plan: Remain inpatient Step down.    Consultants:  Neurology  Procedures:  EEG.   Antibiotics:  none  HPI/Subjective: Patient is lethargic, right arm rigid, she cough a couple of times while I was in the room.  She yell once. Goes back to sleep.   Objective: Filed Vitals:   02/06/15 0700  BP: 123/59  Pulse: 75  Temp:   Resp: 14    Intake/Output Summary (Last 24 hours) at 02/06/15 0806 Last data filed at 02/06/15 5009  Gross per 24 hour  Intake    106 ml  Output      0 ml  Net    106 ml   Filed Weights   02/06/15 0325 02/06/15 0600  Weight: 59.024 kg (130 lb 2 oz) 59.1 kg (130 lb 4.7 oz)    Exam:   General: Patient lethargic, move lower extremity at times.   Cardiovascular: S 1, S 2 RRR  Respiratory: Bilateral ronchus  Abdomen: Bs presents, soft, NT  Musculoskeletal: no edema.   Data Reviewed: Basic Metabolic Panel:  Recent Labs Lab 02/06/15 0349  NA 139  K 3.5  CL 107  CO2 22  GLUCOSE 78  BUN 15  CREATININE 0.88  CALCIUM 9.4   Liver Function Tests:  Recent Labs Lab 02/06/15 0349  AST 23  ALT 16  ALKPHOS 106  BILITOT 0.8  PROT 7.3  ALBUMIN 3.9   No results for input(s): LIPASE, AMYLASE in the last 168 hours. No results for input(s): AMMONIA in the last 168  hours. CBC:  Recent Labs Lab 02/06/15 0349  WBC 6.9  NEUTROABS 3.5  HGB 12.8  HCT 38.2  MCV 88.8  PLT 313   Cardiac Enzymes: No results for input(s): CKTOTAL, CKMB, CKMBINDEX, TROPONINI in the last 168 hours. BNP (last 3 results) No results for input(s): BNP in the last 8760 hours.  ProBNP (last 3 results) No results for input(s): PROBNP in the last 8760 hours.  CBG:  Recent Labs Lab 02/06/15 0509  GLUCAP 93    Recent Results (from the past 240 hour(s))  MRSA PCR Screening     Status: None   Collection Time: 02/06/15  6:18 AM  Result Value Ref Range Status   MRSA by PCR NEGATIVE NEGATIVE Final    Comment:        The GeneXpert MRSA Assay (FDA approved for NASAL specimens only), is one component of a comprehensive MRSA colonization surveillance program. It is not intended to diagnose MRSA infection nor to guide or monitor treatment for MRSA infections.      Studies: Ct Head Wo Contrast  02/06/2015   CLINICAL DATA:  Initial evaluation for acute seizure, possible head injury.  EXAM: CT HEAD WITHOUT CONTRAST  CT MAXILLOFACIAL WITHOUT CONTRAST  TECHNIQUE: Multidetector CT imaging of the head and maxillofacial structures were performed using the standard protocol without intravenous contrast. Multiplanar CT image reconstructions of the  maxillofacial structures were also generated.  COMPARISON:  Prior study from 01/18/2015  FINDINGS: CT HEAD FINDINGS  Atrophy with mild chronic microvascular ischemic changes present.  There is no acute intracranial hemorrhage or infarct. No mass lesion or midline shift. Gray-white matter differentiation is well maintained. Ventricles are normal in size without evidence of hydrocephalus. CSF containing spaces are within normal limits. No extra-axial fluid collection.  The calvarium is intact.  Orbital soft tissues are within normal limits.  The mastoid air cells are clear.  Scalp soft tissues are unremarkable.  CT MAXILLOFACIAL FINDINGS  No  significant soft tissue swelling present within the face. Globes are intact. No retro-orbital pathology. Bony orbits are intact without evidence of orbital floor fracture.  Zygomatic arches are intact. Pterygoid plates are intact. Maxilla intact. No nasal bone fracture. Nasal septum midline.  Mandible intact. Mandibular condyles normally situated within the temporomandibular fossa.  Mild opacity present within the left sphenoid sinus. Paranasal sinuses are otherwise clear.  IMPRESSION: CT HEAD:  No acute intracranial process.  CT MAXILLOFACIAL:  No acute maxillofacial injury.   Electronically Signed   By: Jeannine Boga M.D.   On: 02/06/2015 06:26   Ct Maxillofacial Wo Cm  02/06/2015   CLINICAL DATA:  Initial evaluation for acute seizure, possible head injury.  EXAM: CT HEAD WITHOUT CONTRAST  CT MAXILLOFACIAL WITHOUT CONTRAST  TECHNIQUE: Multidetector CT imaging of the head and maxillofacial structures were performed using the standard protocol without intravenous contrast. Multiplanar CT image reconstructions of the maxillofacial structures were also generated.  COMPARISON:  Prior study from 01/18/2015  FINDINGS: CT HEAD FINDINGS  Atrophy with mild chronic microvascular ischemic changes present.  There is no acute intracranial hemorrhage or infarct. No mass lesion or midline shift. Gray-white matter differentiation is well maintained. Ventricles are normal in size without evidence of hydrocephalus. CSF containing spaces are within normal limits. No extra-axial fluid collection.  The calvarium is intact.  Orbital soft tissues are within normal limits.  The mastoid air cells are clear.  Scalp soft tissues are unremarkable.  CT MAXILLOFACIAL FINDINGS  No significant soft tissue swelling present within the face. Globes are intact. No retro-orbital pathology. Bony orbits are intact without evidence of orbital floor fracture.  Zygomatic arches are intact. Pterygoid plates are intact. Maxilla intact. No nasal  bone fracture. Nasal septum midline.  Mandible intact. Mandibular condyles normally situated within the temporomandibular fossa.  Mild opacity present within the left sphenoid sinus. Paranasal sinuses are otherwise clear.  IMPRESSION: CT HEAD:  No acute intracranial process.  CT MAXILLOFACIAL:  No acute maxillofacial injury.   Electronically Signed   By: Jeannine Boga M.D.   On: 02/06/2015 06:26    Scheduled Meds: . heparin  5,000 Units Subcutaneous 3 times per day  . levETIRAcetam  750 mg Intravenous BID  . thiamine  100 mg Intravenous Daily  . valproate sodium  500 mg Intravenous Q12H   Continuous Infusions: . sodium chloride 75 mL/hr (02/06/15 6734)    Principal Problem:   Seizure secondary to subtherapeutic anticonvulsant medication Active Problems:   Acute encephalopathy   Polysubstance abuse   Recurrent seizures    Time spent: 35 minutes.     Niel Hummer A  [facility] Pager [phone]. If 7PM-7AM, please contact night-coverage at [email or web address], password [facility] 02/06/2015, 8:06 AM  LOS: 0 days

## 2015-02-06 NOTE — Progress Notes (Signed)
EEG Completed; Results Pending  

## 2015-02-06 NOTE — Progress Notes (Signed)
Subjective: Patient much more awke  Exam: Filed Vitals:   02/06/15 0700  BP: 123/59  Pulse: 75  Temp:   Resp: 14   Gen: In bed, NAD MS: Patient is somnolent, however when stimulus is applied, patient initially does not respond(suspect voluntarily), then says "Sir, please stop doing that, my name is Lindsey [name]" in response to mild noxious stimulus to try to rouse her.  YH:TMBPJ at me and fixates and tracks, then closes eyes.  Motor: Guards right arm, with very tender right shoulder.  Sensory:responds to stim x 4.   Pertinent Labs: VPA 29 on admission  Impression: 59 yo F with breakthrough seizures.   Recommendations: 1) Will discuss compliance when patient is more cooperative.  2) EEG 3) UDS 4) Continue vpa and kepra.   Roland Rack, MD [facility] [phone]  If 7pm- 7am, please page neurology on call as listed in [place].

## 2015-02-06 NOTE — ED Provider Notes (Signed)
CSN: [id number]     Arrival date & time 02/06/15  3354 History  This chart was scribed for Delora Fuel, MD by Chester Holstein, ED Scribe. This patient was seen in room A01C/A01C and the patient's care was started at 3:40 AM.     Chief Complaint  Patient presents with  . Seizures     Patient is a 59 y.o. female presenting with seizures. The history is provided by the patient. No language interpreter was used.  Seizures Episode characteristics: no incontinence and no tongue biting    HPI Comments: Lindsey [name] is a 59 y.o. female brought in by ambulance, with PMHx of polysubstance abuse, asthma and seizures who presents to the Emergency Department complaining of seizure. Pt is unsure of events of the seziure. She sates "I woke up here." Per nursing note pt's daughter reported to EMS that pt had 1-4 grand mal seizures lasting about 10 minutes. Pt notes associated headache, possible head injury.   Pt denies tongue biting and urinary or bowel incontinence. Pt last seen in ED for seizures on 01/18/15 and admitted. Pt was not compliant with her seizure medication at that time.  She states she has been taking her Depakote and Keppra since hospitalization. She states she had a beer yesterday but denies any other EtOH use since hospitalization. No family at bedise.   Past Medical History  Diagnosis Date  . Seizures   . Asthma    Past Surgical History  Procedure Laterality Date  . Abdominal hysterectomy     No family history on file. History  Substance Use Topics  . Smoking status: Never Smoker   . Smokeless tobacco: Never Used  . Alcohol Use: Yes     Comment: drinks heavily. Agrees with drinking every other day.    OB History    No data available     Review of Systems  HENT:       Negative for tongue biting  Genitourinary:       Negative for incontinence  Neurological: Positive for seizures.      Allergies  Morphine and related and Penicillins  Home Medications   Prior to  Admission medications   Medication Sig Start Date End Date Taking? Authorizing Provider  acetaminophen (TYLENOL) 500 MG tablet Take 1,000 mg by mouth every 6 (six) hours as needed for moderate pain.     Historical Provider, MD  albuterol (PROVENTIL HFA;VENTOLIN HFA) 108 (90 BASE) MCG/ACT inhaler Inhale 2 puffs into the lungs every 6 (six) hours as needed for wheezing or shortness of breath. 01/19/15   Shanker Kristeen Mans, MD  divalproex (DEPAKOTE) 500 MG DR tablet Take 1 tablet (500 mg total) by mouth every 12 (twelve) hours. 01/19/15   Shanker Kristeen Mans, MD  feeding supplement, ENSURE COMPLETE, (ENSURE COMPLETE) LIQD Take 237 mLs by mouth 2 (two) times daily between meals. 01/19/15   Shanker Kristeen Mans, MD  folic acid (FOLVITE) 1 MG tablet Take 1 tablet (1 mg total) by mouth daily. 01/19/15   Shanker Kristeen Mans, MD  levETIRAcetam (KEPPRA) 750 MG tablet Take 1 tablet (750 mg total) by mouth 2 (two) times daily. 01/19/15   Shanker Kristeen Mans, MD   BP 132/116 mmHg  Pulse 88  Temp(Src) 97.5 F (36.4 C) (Oral)  Resp 17  Ht 5\' 8"  (1.727 m)  Wt 130 lb 2 oz (59.024 kg)  BMI 19.79 kg/m2  SpO2 99% Physical Exam  Constitutional: She is oriented to person, place, and time. She appears well-developed and  well-nourished.  HENT:  Head: Normocephalic.  Mild right periorbital swelling with tenderness  Eyes: Conjunctivae are normal. Pupils are equal, round, and reactive to light.  Neck: Normal range of motion. Neck supple. No JVD present.  Cardiovascular: Normal rate, regular rhythm and normal heart sounds.   No murmur heard. Pulmonary/Chest: Effort normal and breath sounds normal. She has no wheezes. She has no rales. She exhibits no tenderness.  Abdominal: Soft. Bowel sounds are normal. She exhibits no distension and no mass. There is no tenderness.  Musculoskeletal: Normal range of motion. She exhibits no edema.  Lymphadenopathy:    She has no cervical adenopathy.  Neurological: She is alert and oriented to  person, place, and time. No cranial nerve deficit. She exhibits normal muscle tone. Coordination normal.  Skin: Skin is warm and dry. No rash noted.  Psychiatric: She has a normal mood and affect. Her behavior is normal.  Nursing note and vitals reviewed.   ED Course  Procedures (including critical care time) DIAGNOSTIC STUDIES: Oxygen Saturation is 99% on room air, normal by my interpretation.    COORDINATION OF CARE: 3:43 AM Discussed treatment plan with patient at beside, the patient agrees with the plan and has no further questions at this time.   Labs Review Results for orders placed or performed during the hospital encounter of 02/06/15  Valproic Acid (depakote) Level  (if patient is taking this medication)  Result Value Ref Range   Valproic Acid Lvl 29 (L) 50.0 - 100.0 ug/mL  Comprehensive metabolic panel  Result Value Ref Range   Sodium 139 135 - 145 mmol/L   Potassium 3.5 3.5 - 5.1 mmol/L   Chloride 107 101 - 111 mmol/L   CO2 22 22 - 32 mmol/L   Glucose, Bld 78 65 - 99 mg/dL   BUN 15 6 - 20 mg/dL   Creatinine, Ser 0.88 0.44 - 1.00 mg/dL   Calcium 9.4 8.9 - 10.3 mg/dL   Total Protein 7.3 6.5 - 8.1 g/dL   Albumin 3.9 3.5 - 5.0 g/dL   AST 23 15 - 41 U/L   ALT 16 14 - 54 U/L   Alkaline Phosphatase 106 38 - 126 U/L   Total Bilirubin 0.8 0.3 - 1.2 mg/dL   GFR calc non Af Amer >60 >60 mL/min   GFR calc Af Amer >60 >60 mL/min   Anion gap 10 5 - 15  Ethanol  Result Value Ref Range   Alcohol, Ethyl (B) <5 <5 mg/dL  CBC with Differential  Result Value Ref Range   WBC 6.9 4.0 - 10.5 K/uL   RBC 4.30 3.87 - 5.11 MIL/uL   Hemoglobin 12.8 12.0 - 15.0 g/dL   HCT 38.2 36.0 - 46.0 %   MCV 88.8 78.0 - 100.0 fL   MCH 29.8 26.0 - 34.0 pg   MCHC 33.5 30.0 - 36.0 g/dL   RDW 14.3 11.5 - 15.5 %   Platelets 313 150 - 400 K/uL   Neutrophils Relative % 51 43 - 77 %   Neutro Abs 3.5 1.7 - 7.7 K/uL   Lymphocytes Relative 40 12 - 46 %   Lymphs Abs 2.8 0.7 - 4.0 K/uL   Monocytes  Relative 8 3 - 12 %   Monocytes Absolute 0.6 0.1 - 1.0 K/uL   Eosinophils Relative 1 0 - 5 %   Eosinophils Absolute 0.1 0.0 - 0.7 K/uL   Basophils Relative 0 0 - 1 %   Basophils Absolute 0.0 0.0 - 0.1 K/uL  CBG monitoring, ED  Result Value Ref Range   Glucose-Capillary 93 65 - 99 mg/dL    Imaging Review Ct Head Wo Contrast  02/06/2015   CLINICAL DATA:  Initial evaluation for acute seizure, possible head injury.  EXAM: CT HEAD WITHOUT CONTRAST  CT MAXILLOFACIAL WITHOUT CONTRAST  TECHNIQUE: Multidetector CT imaging of the head and maxillofacial structures were performed using the standard protocol without intravenous contrast. Multiplanar CT image reconstructions of the maxillofacial structures were also generated.  COMPARISON:  Prior study from 01/18/2015  FINDINGS: CT HEAD FINDINGS  Atrophy with mild chronic microvascular ischemic changes present.  There is no acute intracranial hemorrhage or infarct. No mass lesion or midline shift. Gray-white matter differentiation is well maintained. Ventricles are normal in size without evidence of hydrocephalus. CSF containing spaces are within normal limits. No extra-axial fluid collection.  The calvarium is intact.  Orbital soft tissues are within normal limits.  The mastoid air cells are clear.  Scalp soft tissues are unremarkable.  CT MAXILLOFACIAL FINDINGS  No significant soft tissue swelling present within the face. Globes are intact. No retro-orbital pathology. Bony orbits are intact without evidence of orbital floor fracture.  Zygomatic arches are intact. Pterygoid plates are intact. Maxilla intact. No nasal bone fracture. Nasal septum midline.  Mandible intact. Mandibular condyles normally situated within the temporomandibular fossa.  Mild opacity present within the left sphenoid sinus. Paranasal sinuses are otherwise clear.  IMPRESSION: CT HEAD:  No acute intracranial process.  CT MAXILLOFACIAL:  No acute maxillofacial injury.   Electronically Signed    By: Jeannine Boga M.D.   On: 02/06/2015 06:26   Ct Maxillofacial Wo Cm  02/06/2015   CLINICAL DATA:  Initial evaluation for acute seizure, possible head injury.  EXAM: CT HEAD WITHOUT CONTRAST  CT MAXILLOFACIAL WITHOUT CONTRAST  TECHNIQUE: Multidetector CT imaging of the head and maxillofacial structures were performed using the standard protocol without intravenous contrast. Multiplanar CT image reconstructions of the maxillofacial structures were also generated.  COMPARISON:  Prior study from 01/18/2015  FINDINGS: CT HEAD FINDINGS  Atrophy with mild chronic microvascular ischemic changes present.  There is no acute intracranial hemorrhage or infarct. No mass lesion or midline shift. Gray-white matter differentiation is well maintained. Ventricles are normal in size without evidence of hydrocephalus. CSF containing spaces are within normal limits. No extra-axial fluid collection.  The calvarium is intact.  Orbital soft tissues are within normal limits.  The mastoid air cells are clear.  Scalp soft tissues are unremarkable.  CT MAXILLOFACIAL FINDINGS  No significant soft tissue swelling present within the face. Globes are intact. No retro-orbital pathology. Bony orbits are intact without evidence of orbital floor fracture.  Zygomatic arches are intact. Pterygoid plates are intact. Maxilla intact. No nasal bone fracture. Nasal septum midline.  Mandible intact. Mandibular condyles normally situated within the temporomandibular fossa.  Mild opacity present within the left sphenoid sinus. Paranasal sinuses are otherwise clear.  IMPRESSION: CT HEAD:  No acute intracranial process.  CT MAXILLOFACIAL:  No acute maxillofacial injury.   Electronically Signed   By: Jeannine Boga M.D.   On: 02/06/2015 06:26    ECG shows normal sinus rhythm with a rate of 82, no ectopy. Normal axis. Normal P wave. Normal QRS. Normal intervals. Voltage criteria for left ventricular hypertrophy present. Normal ST and T waves.  Impression: Probable left ventricular hypertrophy. When compared with ECG of 01/18/2015, no significant changes are seen.   MDM   Final diagnoses:  Seizure  Pain  Seizure  secondary to subtherapeutic anticonvulsant medication    Apparent seizure. Old records are reviewed and she has had problems with medication compliance in the past. Patient assures me that she has been compliant. Laboratory workup was done including valproic acid level which has come back subtherapeutic but detectable. CT of head and maxillofacial showed no acute process. I was called to the room when patient's oxygen saturations dropped and was told by RN they were concerned that she had aspirated. However, it appears that she had another seizure and was postictal. Oxygen saturations quickly came up without any specific treatment. Because of multiple seizures, it was decided to admit her. Case is discussed with Dr. Posey Pronto of [facility] who agrees to admit the patient. Dr. Janann Colonel of neurology service also saw the patient in consultation and is giving her a loading dose of valproic acid.   I personally performed the services described in this documentation, which was scribed in my presence. The recorded information has been reviewed and is accurate.       Delora Fuel, MD 81/[id number]

## 2015-02-06 NOTE — ED Notes (Signed)
IV team at the bedside. 

## 2015-02-06 NOTE — ED Notes (Signed)
Per EMS daughter states patient had 1-4 gran mal seizures that lasted 10 minutes.  Hx of seizures.  On depakote.  Drank one beer today.  Noted to be very drowsy.

## 2015-02-06 NOTE — ED Notes (Signed)
CT transport came to pick up patient, delayed until IV access obtained.

## 2015-02-06 NOTE — ED Notes (Signed)
Dr. Glick at bedside.  

## 2015-02-06 NOTE — Consult Note (Signed)
Consult Reason for Consult:seizures Referring Physician: Dr Roxanne Mins  CC: seizures  HPI: Lindsey [name] is an 59 y.o. female with history of seizures, medication noncompliance and substance abuse presenting to the ED with breakthrough seizures. Per faimily/records she had 1-3 GTC seizures in a row at home. Returned to baseline in ED and then had subsequent seizure lasting <1-2 minutes. Given ativan and seizure broke.   Patient reports compliance with her seizure medications (keppra 750mg  q12 and VPA 500mg  q12). Current VPA level of 29.   Past Medical History  Diagnosis Date  . Seizures   . Asthma     Past Surgical History  Procedure Laterality Date  . Abdominal hysterectomy      No family history on file.  Social History:  reports that she has never smoked. She has never used smokeless tobacco. She reports that she drinks alcohol. She reports that she uses illicit drugs (Marijuana).  Allergies  Allergen Reactions  . Morphine And Related Hives  . Penicillins Nausea Only    Medications: I have reviewed the patient's current medications.   ROS: Out of a complete 14 system review, the patient complains of only the following symptoms, and all other reviewed systems are negative. Unable to test due to mental status  Physical Examination: Filed Vitals:   02/06/15 0325  BP: 132/116  Pulse: 88  Temp: 97.5 F (36.4 C)  Resp: 17   Physical Exam  Constitutional: He appears well-developed and well-nourished.  Psych: Affect appropriate to situation Eyes: No scleral injection HENT: No OP obstrucion Head: Normocephalic.  Cardiovascular: Normal rate and regular rhythm.  Respiratory: Effort normal and breath sounds normal.  GI: Soft. Bowel sounds are normal. No distension. There is no tenderness.  Skin: WDI  Neurologic Examination Mental Status: Lethargic but opens eyes to voice/noxious stimuli, nonverbal but will yell out to noxious stimuli. Not following  commands Cranial Nerves: II: unable to visualize optic discs, blinks to threat bilaterally, pupils equal, round, reactive to light III,IV, VI: ptosis not present, doesn't track but appears to move eyes fully. No gaze deviation or preference V,VII: face symmetric VIII: hearing normal bilaterally IX,X: gag reflex present XI: unable to test XII: unable to test  Motor: Due to mental status unable to formally test but appears to move all extremities symmetrically and against light resistance Sensory: withdrawals to noxious stimuli in all 4 extremities Deep Tendon Reflexes: 2+ and symmetric throughout Plantars: Right: downgoingLeft: downgoing Cerebellar: Unable to test Gait: unable to test Laboratory Studies:   Basic Metabolic Panel:  Recent Labs Lab 02/06/15 0349  NA 139  K 3.5  CL 107  CO2 22  GLUCOSE 78  BUN 15  CREATININE 0.88  CALCIUM 9.4    Liver Function Tests:  Recent Labs Lab 02/06/15 0349  AST 23  ALT 16  ALKPHOS 106  BILITOT 0.8  PROT 7.3  ALBUMIN 3.9   No results for input(s): LIPASE, AMYLASE in the last 168 hours. No results for input(s): AMMONIA in the last 168 hours.  CBC:  Recent Labs Lab 02/06/15 0349  WBC 6.9  NEUTROABS 3.5  HGB 12.8  HCT 38.2  MCV 88.8  PLT 313    Cardiac Enzymes: No results for input(s): CKTOTAL, CKMB, CKMBINDEX, TROPONINI in the last 168 hours.  BNP: Invalid input(s): POCBNP  CBG: No results for input(s): GLUCAP in the last 168 hours.  Microbiology: Results for orders placed or performed during the hospital encounter of 01/18/15  Urine culture     Status: None  Collection Time: 01/18/15  6:24 AM  Result Value Ref Range Status   Specimen Description URINE, CLEAN CATCH  Final   Special Requests NONE  Final   Colony Count   Final    >=100,000 COLONIES/ML Performed at [facility]    Culture   Final    ESCHERICHIA COLI Performed at [facility]    Report  Status 01/21/2015 FINAL  Final   Organism ID, Bacteria ESCHERICHIA COLI  Final      Susceptibility   Escherichia coli - MIC*    AMPICILLIN <=2 SENSITIVE Sensitive     CEFAZOLIN <=4 SENSITIVE Sensitive     CEFTRIAXONE <=1 SENSITIVE Sensitive     CIPROFLOXACIN <=0.25 SENSITIVE Sensitive     GENTAMICIN <=1 SENSITIVE Sensitive     LEVOFLOXACIN <=0.12 SENSITIVE Sensitive     NITROFURANTOIN <=16 SENSITIVE Sensitive     TOBRAMYCIN <=1 SENSITIVE Sensitive     TRIMETH/SULFA <=20 SENSITIVE Sensitive     PIP/TAZO <=4 SENSITIVE Sensitive     * ESCHERICHIA COLI  Blood culture (routine x 2)     Status: None   Collection Time: 01/18/15  9:10 AM  Result Value Ref Range Status   Specimen Description BLOOD RIGHT ARM  Final   Special Requests BOTTLES DRAWN AEROBIC AND ANAEROBIC 5CC  Final   Culture   Final    NO GROWTH 5 DAYS Performed at [facility]    Report Status 01/24/2015 FINAL  Final  Blood culture (routine x 2)     Status: None   Collection Time: 01/18/15  9:16 AM  Result Value Ref Range Status   Specimen Description BLOOD LEFT HAND  Final   Special Requests BOTTLES DRAWN AEROBIC ONLY 3CC  Final   Culture   Final    NO GROWTH 5 DAYS Note: Culture results may be compromised due to an inadequate volume of blood received in culture bottles. Performed at [facility]    Report Status 01/24/2015 FINAL  Final  Culture, blood (routine x 2)     Status: None   Collection Time: 01/18/15  3:45 PM  Result Value Ref Range Status   Specimen Description BLOOD RIGHT ANTECUBITAL  Final   Special Requests BOTTLES DRAWN AEROBIC ONLY 10CC  Final   Culture   Final    NO GROWTH 5 DAYS Performed at [facility]    Report Status 01/25/2015 FINAL  Final  Culture, blood (routine x 2)     Status: None   Collection Time: 01/18/15  3:48 PM  Result Value Ref Range Status   Specimen Description BLOOD RIGHT HAND  Final   Special Requests BOTTLES DRAWN AEROBIC ONLY 3CC  Final    Culture   Final    NO GROWTH 5 DAYS Note: Culture results may be compromised due to an inadequate volume of blood received in culture bottles. Performed at [facility]    Report Status 01/25/2015 FINAL  Final    Coagulation Studies: No results for input(s): LABPROT, INR in the last 72 hours.  Urinalysis: No results for input(s): COLORURINE, LABSPEC, PHURINE, GLUCOSEU, HGBUR, BILIRUBINUR, KETONESUR, PROTEINUR, UROBILINOGEN, NITRITE, LEUKOCYTESUR in the last 168 hours.  Invalid input(s): APPERANCEUR  Lipid Panel:  No results found for: CHOL, TRIG, HDL, CHOLHDL, VLDL, LDLCALC  HgbA1C: No results found for: HGBA1C  Urine Drug Screen:     Component Value Date/Time   LABOPIA NONE DETECTED 01/18/2015 0625   COCAINSCRNUR POSITIVE* 01/18/2015 0625   LABBENZ NONE DETECTED 01/18/2015  0625   AMPHETMU NONE DETECTED 01/18/2015 0625   THCU POSITIVE* 01/18/2015 0625   LABBARB NONE DETECTED 01/18/2015 0625    Alcohol Level:  Recent Labs Lab 02/06/15 0350  ETH <5     Imaging: No results found.   Assessment/Plan:  59y/o woman with history of seizure disorder, medication noncompliance and substance abuse presents to ED with breakthrough seizures. Currently lethargic but easily aroused and withdrawals to noxious stimuli. Appears post-ictal. Question of compliance as VPA level 29.   -load with VPA -check CT head -check UDS -continue keppra 750mg  q12 and VPA 500mg  q12   [name], DO [facility] [phone]  If 7pm- 7am, please page neurology on call as listed in AMION. 02/06/2015, 4:46 AM

## 2015-02-06 NOTE — ED Notes (Signed)
Dr. Patel at bedside 

## 2015-02-07 ENCOUNTER — Encounter (HOSPITAL_COMMUNITY): Payer: Self-pay | Admitting: *Deleted

## 2015-02-07 LAB — RAPID URINE DRUG SCREEN, HOSP PERFORMED
Amphetamines: NOT DETECTED
Barbiturates: NOT DETECTED
Benzodiazepines: NOT DETECTED
Cocaine: NOT DETECTED
Opiates: NOT DETECTED
Tetrahydrocannabinol: POSITIVE — AB

## 2015-02-07 LAB — TROPONIN I: Troponin I: <0.03 ng/mL (ref <0.031)

## 2015-02-07 LAB — BASIC METABOLIC PANEL
ANION GAP: 10 (ref 5–15)
BUN: 8 mg/dL (ref 6–20)
CO2: 21 mmol/L — ABNORMAL LOW (ref 22–32)
CREATININE: 0.76 mg/dL (ref 0.44–1.00)
Calcium: 8.8 mg/dL — ABNORMAL LOW (ref 8.9–10.3)
Chloride: 108 mmol/L (ref 101–111)
GFR calc Af Amer: >60 mL/min (ref >60)
Glucose, Bld: 87 mg/dL (ref 65–99)
POTASSIUM: 4.8 mmol/L (ref 3.5–5.1)
Sodium: 139 mmol/L (ref 135–145)

## 2015-02-07 LAB — GLUCOSE, CAPILLARY
GLUCOSE-CAPILLARY: 98 mg/dL (ref 65–99)
Glucose-Capillary: 126 mg/dL — ABNORMAL HIGH (ref 65–99)

## 2015-02-07 LAB — MAGNESIUM: Magnesium: 1.8 mg/dL (ref 1.7–2.4)

## 2015-02-07 MED ORDER — CLINDAMYCIN HCL 300 MG PO CAPS: 300.0000 mg | ORAL_CAPSULE | Freq: Three times a day (TID) | ORAL | Status: DC

## 2015-02-07 MED ORDER — MAGNESIUM OXIDE 400 (241.3 MG) MG PO TABS
400.0000 mg | ORAL_TABLET | Freq: Two times a day (BID) | ORAL | Status: DC
  Administered 2015-02-07: 400 mg via ORAL
  Filled 2015-02-07 (×2): qty 1

## 2015-02-07 MED ORDER — MAGNESIUM OXIDE 400 (241.3 MG) MG PO TABS: 400.0000 mg | ORAL_TABLET | Freq: Two times a day (BID) | ORAL | Status: DC

## 2015-02-07 MED ORDER — VALPROIC ACID 250 MG PO CAPS
500.0000 mg | ORAL_CAPSULE | Freq: Two times a day (BID) | ORAL | Status: DC
  Administered 2015-02-07: 500 mg via ORAL
  Filled 2015-02-07 (×2): qty 2

## 2015-02-07 MED ORDER — DIVALPROEX SODIUM 500 MG PO DR TAB: 500.0000 mg | DELAYED_RELEASE_TABLET | Freq: Two times a day (BID) | ORAL | Status: DC

## 2015-02-07 MED ORDER — LEVETIRACETAM 750 MG PO TABS: 750.0000 mg | ORAL_TABLET | Freq: Two times a day (BID) | ORAL | Status: DC

## 2015-02-07 MED ORDER — CLINDAMYCIN HCL 300 MG PO CAPS
300.0000 mg | ORAL_CAPSULE | Freq: Three times a day (TID) | ORAL | Status: DC
  Filled 2015-02-07 (×4): qty 1

## 2015-02-07 MED ORDER — MAGNESIUM SULFATE 2 GM/50ML IV SOLN: 2.0000 g | Freq: Once | INTRAVENOUS | Status: DC

## 2015-02-07 MED ORDER — LEVETIRACETAM 750 MG PO TABS
750.0000 mg | ORAL_TABLET | Freq: Two times a day (BID) | ORAL | Status: DC
  Administered 2015-02-07: 750 mg via ORAL
  Filled 2015-02-07 (×2): qty 1

## 2015-02-07 MED ORDER — CLINDAMYCIN HCL 300 MG PO CAPS
300.0000 mg | ORAL_CAPSULE | Freq: Three times a day (TID) | ORAL | Status: DC
  Administered 2015-02-07: 300 mg via ORAL
  Filled 2015-02-07 (×4): qty 1

## 2015-02-07 NOTE — Discharge Summary (Signed)
Physician Discharge Summary  Chari Parmenter ERD:[id number] DOB: [birth year] DOA: 02/06/2015  PCP: [name], MD  Admit date: 02/06/2015 Discharge date: 02/07/2015  Time spent: 35 minutes  Recommendations for Outpatient Follow-up:  1. counseling regarding complaint with medications.   Discharge Diagnoses:    Seizure secondary to subtherapeutic anticonvulsant medication   Acute encephalopathy   Polysubstance abuse   Recurrent seizures   Discharge Condition: Stable.   Diet recommendation: Heart healthy  Filed Weights   02/06/15 0325 02/06/15 0600  Weight: 59.024 kg (130 lb 2 oz) 59.1 kg (130 lb 4.7 oz)    History of present illness:  Lindsey [name] is a 59 y.o. female with Past medical history of seizure, substance abuse. The patient is presenting with a seizure-like episode. The history is limited due to patient's condition. The patient as per documentation had around 3 seizures which are generalized tonic-clonic at home. Patient was initially admitted baseline in the ED. In the ED she had another episode of seizure with loss of her 1 minute. Patient has received lorazepam. The patient reportedly mentioned that she was compliant with all the medications.  The patient is coming from home And at her baseline independent for most of her ADL.  Hospital Course:  1-Seizure:  Seondary to subtherapeutic anticonvulsant medication. Non compliant. Continue with Keppra and Valproate.  Ativan PRN for seizure.  Ct Head: No acute intracranial process.  Explain to patient importance of taking mediations.   2-AMS, Encephalopathy;  Related to seizure, UDS pending.  Back to baseline.   3-Facial injury. Right eye erythema. Improved on clindamycin to cover for preseptal cellulitis. Will provide 5 day treatment.   4-PVC:  mg at 1.8. Patient decline IV supplement. Will supplement orally. K and troponin normal. She was asymptomatic.   Procedures: EEG; EEG Abnormalities: 1) Left  frontal sharp waves   Clinical Interpretation: This EEG is consistent with a potential seizure focus in the left frontal region. There was no seizure recorded on this study.   Consultations:  neurology  Discharge Exam: Filed Vitals:   02/07/15 0800  BP: 144/74  Pulse: 71  Temp:   Resp: 17    General: NAD Cardiovascular: S 1, S 2 RRR Respiratory: CTA  Discharge Instructions   Discharge Instructions    Diet - low sodium heart healthy    Complete by:  As directed      Increase activity slowly    Complete by:  As directed           Current Discharge Medication List    START taking these medications   Details  clindamycin (CLEOCIN) 300 MG capsule Take 1 capsule (300 mg total) by mouth every 8 (eight) hours. Qty: 12 capsule, Refills: 0      CONTINUE these medications which have CHANGED   Details  divalproex (DEPAKOTE) 500 MG DR tablet Take 1 tablet (500 mg total) by mouth every 12 (twelve) hours. Qty: 60 tablet, Refills: 0    levETIRAcetam (KEPPRA) 750 MG tablet Take 1 tablet (750 mg total) by mouth 2 (two) times daily. Qty: 60 tablet, Refills: 0      CONTINUE these medications which have NOT CHANGED   Details  acetaminophen (TYLENOL) 500 MG tablet Take 1,000 mg by mouth every 6 (six) hours as needed for moderate pain.     albuterol (PROVENTIL HFA;VENTOLIN HFA) 108 (90 BASE) MCG/ACT inhaler Inhale 2 puffs into the lungs every 6 (six) hours as needed for wheezing or shortness of breath. Qty: 18 g, Refills: 0  diclofenac sodium (VOLTAREN) 1 % GEL Apply 4 g topically 4 (four) times daily.    feeding supplement, ENSURE COMPLETE, (ENSURE COMPLETE) LIQD Take 237 mLs by mouth 2 (two) times daily between meals. Qty: 60 Bottle, Refills: 0    folic acid (FOLVITE) 1 MG tablet Take 1 tablet (1 mg total) by mouth daily. Qty: 30 tablet, Refills: 0    LORazepam (ATIVAN) 2 MG tablet Take 2 mg by mouth every 6 (six) hours as needed for anxiety.       Allergies   Allergen Reactions  . Morphine And Related Hives  . Penicillins Nausea Only   Follow-up Information    Follow up with [name], MD In 1 week.   Specialty:  Family Medicine   Contact information:   [place] [id number] [phone]        The results of significant diagnostics from this hospitalization (including imaging, microbiology, ancillary and laboratory) are listed below for reference.    Significant Diagnostic Studies: Ct Head Wo Contrast  02/06/2015   CLINICAL DATA:  Initial evaluation for acute seizure, possible head injury.  EXAM: CT HEAD WITHOUT CONTRAST  CT MAXILLOFACIAL WITHOUT CONTRAST  TECHNIQUE: Multidetector CT imaging of the head and maxillofacial structures were performed using the standard protocol without intravenous contrast. Multiplanar CT image reconstructions of the maxillofacial structures were also generated.  COMPARISON:  Prior study from 01/18/2015  FINDINGS: CT HEAD FINDINGS  Atrophy with mild chronic microvascular ischemic changes present.  There is no acute intracranial hemorrhage or infarct. No mass lesion or midline shift. Gray-white matter differentiation is well maintained. Ventricles are normal in size without evidence of hydrocephalus. CSF containing spaces are within normal limits. No extra-axial fluid collection.  The calvarium is intact.  Orbital soft tissues are within normal limits.  The mastoid air cells are clear.  Scalp soft tissues are unremarkable.  CT MAXILLOFACIAL FINDINGS  No significant soft tissue swelling present within the face. Globes are intact. No retro-orbital pathology. Bony orbits are intact without evidence of orbital floor fracture.  Zygomatic arches are intact. Pterygoid plates are intact. Maxilla intact. No nasal bone fracture. Nasal septum midline.  Mandible intact. Mandibular condyles normally situated within the temporomandibular fossa.  Mild opacity present within the left sphenoid sinus. Paranasal  sinuses are otherwise clear.  IMPRESSION: CT HEAD:  No acute intracranial process.  CT MAXILLOFACIAL:  No acute maxillofacial injury.   Electronically Signed   By: Jeannine Boga M.D.   On: 02/06/2015 06:26   Ct Head Wo Contrast  01/18/2015   CLINICAL DATA:  Seizures x 3 Hx of seizures Pt is not cooperative and does not follow commands Pt Very combative during transport from stretcher to/from table Pt pulled her IV out after scan Unable to ask medical questions  EXAM: CT HEAD WITHOUT CONTRAST  TECHNIQUE: Contiguous axial images were obtained from the base of the skull through the vertex without intravenous contrast.  COMPARISON:  03/04/2014  FINDINGS: Ventricles are normal in size and configuration. There are no parenchymal masses or mass effect. There is no evidence of an infarct. There are no extra-axial masses or abnormal fluid collections.  No intracranial hemorrhage.  Some dependent secretions in the left sphenoid sinus. Remaining visualized sinuses are clear. Clear mastoid air cells. No skull lesion.  IMPRESSION: No acute intracranial abnormalities.   Electronically Signed   By: Lajean Manes M.D.   On: 01/18/2015 08:31   Dg Chest Port 1 View  02/06/2015   CLINICAL DATA:  Shortness of breath  EXAM: PORTABLE CHEST - 1 VIEW  COMPARISON:  Portable chest x-ray of 01/18/2015  FINDINGS: No active infiltrate or effusion is seen. Mediastinal and hilar contours are unremarkable. Heart size is stable. No bony abnormality is seen.  IMPRESSION: No active disease.   Electronically Signed   By: Ivar Drape M.D.   On: 02/06/2015 08:18   Dg Chest Portable 1 View  01/18/2015   CLINICAL DATA:  Seizures.  EXAM: PORTABLE CHEST - 1 VIEW  COMPARISON:  03/04/2014  FINDINGS: Cardiac silhouette is normal in size and configuration. Aorta is mildly uncoiled. No mediastinal or hilar masses or evidence of adenopathy. Clear lungs. No pleural effusion or pneumothorax.  Bony thorax is grossly intact.  IMPRESSION: No active  disease.   Electronically Signed   By: Lajean Manes M.D.   On: 01/18/2015 09:16   Dg Shoulder Right Port  02/06/2015   CLINICAL DATA:  59 year old female with right shoulder pain  EXAM: PORTABLE RIGHT SHOULDER - 2+ VIEW  COMPARISON:  None.  FINDINGS: There is no evidence of fracture or dislocation. There is no evidence of arthropathy or other focal bone abnormality. Soft tissues are unremarkable.  IMPRESSION: Negative.   Electronically Signed   By: Jacqulynn Cadet M.D.   On: 02/06/2015 11:02   Ct Maxillofacial Wo Cm  02/06/2015   CLINICAL DATA:  Initial evaluation for acute seizure, possible head injury.  EXAM: CT HEAD WITHOUT CONTRAST  CT MAXILLOFACIAL WITHOUT CONTRAST  TECHNIQUE: Multidetector CT imaging of the head and maxillofacial structures were performed using the standard protocol without intravenous contrast. Multiplanar CT image reconstructions of the maxillofacial structures were also generated.  COMPARISON:  Prior study from 01/18/2015  FINDINGS: CT HEAD FINDINGS  Atrophy with mild chronic microvascular ischemic changes present.  There is no acute intracranial hemorrhage or infarct. No mass lesion or midline shift. Gray-white matter differentiation is well maintained. Ventricles are normal in size without evidence of hydrocephalus. CSF containing spaces are within normal limits. No extra-axial fluid collection.  The calvarium is intact.  Orbital soft tissues are within normal limits.  The mastoid air cells are clear.  Scalp soft tissues are unremarkable.  CT MAXILLOFACIAL FINDINGS  No significant soft tissue swelling present within the face. Globes are intact. No retro-orbital pathology. Bony orbits are intact without evidence of orbital floor fracture.  Zygomatic arches are intact. Pterygoid plates are intact. Maxilla intact. No nasal bone fracture. Nasal septum midline.  Mandible intact. Mandibular condyles normally situated within the temporomandibular fossa.  Mild opacity present within the  left sphenoid sinus. Paranasal sinuses are otherwise clear.  IMPRESSION: CT HEAD:  No acute intracranial process.  CT MAXILLOFACIAL:  No acute maxillofacial injury.   Electronically Signed   By: Jeannine Boga M.D.   On: 02/06/2015 06:26    Microbiology: Recent Results (from the past 240 hour(s))  MRSA PCR Screening     Status: None   Collection Time: 02/06/15  6:18 AM  Result Value Ref Range Status   MRSA by PCR NEGATIVE NEGATIVE Final    Comment:        The GeneXpert MRSA Assay (FDA approved for NASAL specimens only), is one component of a comprehensive MRSA colonization surveillance program. It is not intended to diagnose MRSA infection nor to guide or monitor treatment for MRSA infections.      Labs: Basic Metabolic Panel:  Recent Labs Lab 02/06/15 0349 02/06/15 1150  NA 139 139  K 3.5 4.2  CL 107 106  CO2 22 23  GLUCOSE 78 71  BUN 15 10  CREATININE 0.88 0.76  CALCIUM 9.4 8.8*   Liver Function Tests:  Recent Labs Lab 02/06/15 0349  AST 23  ALT 16  ALKPHOS 106  BILITOT 0.8  PROT 7.3  ALBUMIN 3.9   No results for input(s): LIPASE, AMYLASE in the last 168 hours. No results for input(s): AMMONIA in the last 168 hours. CBC:  Recent Labs Lab 02/06/15 0349 02/06/15 1150  WBC 6.9 7.4  NEUTROABS 3.5  --   HGB 12.8 13.3  HCT 38.2 38.7  MCV 88.8 88.0  PLT 313 235   Cardiac Enzymes: No results for input(s): CKTOTAL, CKMB, CKMBINDEX, TROPONINI in the last 168 hours. BNP: BNP (last 3 results) No results for input(s): BNP in the last 8760 hours.  ProBNP (last 3 results) No results for input(s): PROBNP in the last 8760 hours.  CBG:  Recent Labs Lab 02/06/15 1250 02/06/15 1614 02/06/15 1949 02/07/15 0115 02/07/15 0755  GLUCAP 112* 85 110* 98 126*       Signed:  Corian Handley A  [facility] 02/07/2015, 8:48 AM

## 2015-02-07 NOTE — Progress Notes (Signed)
Subjective: Feels back to baseline.   Exam: Filed Vitals:   02/07/15 0800  BP: 144/74  Pulse: 71  Temp:   Resp: 17   Gen: In bed, NAD MS: awake, alert, oriented.  SX:[id number] Motor: MAEW Sensory:intact to LT   Impression: 59 yo F with breakthrough seizure sin teh setting of medication non-compliance(ran out). She is back to baseline.   Recommendations: 1) No changes to home doses given non-compliance.  2) No further recs at this time. Patient ok to d/c from neuro perspective.  3) Please call with any further questions or concerns.   Roland Rack, MD [facility] [phone]  If 7pm- 7am, please page neurology on call as listed in [place].

## 2015-02-07 NOTE — Progress Notes (Signed)
Nutrition Brief Note  Consult received for pt who reports to RN that she only consumes ensure at home.  Pt with hx of polysubstance abuse, recurrent seizures and noncompliance with sz medications.  Discussed with pt whom it was difficult to obtain specifics from. She reports that she eats at home and drinks ensure when she can get it.  Breakfast: ensure, cereal Lunch: bologna sandwich (she loves bologna) Dinner: bologna sandwich or hot meal prepared at home (she does not always like what they prepare and does not always eat it) Pt eating 100% of her meals here, reports she loves fruit but is on a limited income and cannot afford much.  Her church gave her ensure last time and she loves it and drinks it when she can get it. It appears that she has limited income for food, etc.    Wt Readings from Last 15 Encounters:  02/06/15 130 lb 4.7 oz (59.1 kg)  01/18/15 145 lb (65.772 kg)  11/17/14 145 lb (65.772 kg)  03/04/14 142 lb 12.8 oz (64.774 kg)  11/06/13 136 lb 3.9 oz (61.8 kg)    Body mass index is 21.04 kg/(m^2). WNL  Current diet order is Regular, patient is consuming approximately 100% of meals at this time. Labs and medications reviewed.   We discussed inexpensive options for fruit and ensure. Pt very pleasant and appreciative of visit.  Social worker also to visit pt today.   [place], [place], [place] Pager [phone] After Hours Pager

## 2015-02-10 LAB — GLUCOSE, CAPILLARY: Glucose-Capillary: 30 mg/dL — CL (ref 65–99)

## 2015-03-01 ENCOUNTER — Emergency Department (HOSPITAL_COMMUNITY): Payer: Medicaid Other

## 2015-03-01 ENCOUNTER — Inpatient Hospital Stay (HOSPITAL_COMMUNITY)
Admission: EM | Admit: 2015-03-01 | Discharge: 2015-03-08 | DRG: 101 | Disposition: A | Discharge status: Home / Self Care | Payer: Medicaid Other | Attending: Internal Medicine | Admitting: Internal Medicine

## 2015-03-01 ENCOUNTER — Encounter (HOSPITAL_COMMUNITY): Payer: Self-pay

## 2015-03-01 DIAGNOSIS — A047 Enterocolitis due to Clostridium difficile: Secondary | ICD-10-CM | POA: Diagnosis not present

## 2015-03-01 DIAGNOSIS — T426X6A Underdosing of other antiepileptic and sedative-hypnotic drugs, initial encounter: Secondary | ICD-10-CM | POA: Diagnosis present

## 2015-03-01 DIAGNOSIS — Y92002 Bathroom of unspecified non-institutional (private) residence single-family (private) house as the place of occurrence of the external cause: Secondary | ICD-10-CM

## 2015-03-01 DIAGNOSIS — S0512XA Contusion of eyeball and orbital tissues, left eye, initial encounter: Secondary | ICD-10-CM | POA: Diagnosis present

## 2015-03-01 DIAGNOSIS — F1910 Other psychoactive substance abuse, uncomplicated: Secondary | ICD-10-CM | POA: Diagnosis present

## 2015-03-01 DIAGNOSIS — Z79899 Other long term (current) drug therapy: Secondary | ICD-10-CM

## 2015-03-01 DIAGNOSIS — M25551 Pain in right hip: Secondary | ICD-10-CM | POA: Diagnosis present

## 2015-03-01 DIAGNOSIS — S0990XA Unspecified injury of head, initial encounter: Secondary | ICD-10-CM | POA: Diagnosis present

## 2015-03-01 DIAGNOSIS — R569 Unspecified convulsions: Secondary | ICD-10-CM | POA: Diagnosis not present

## 2015-03-01 DIAGNOSIS — Z885 Allergy status to narcotic agent status: Secondary | ICD-10-CM

## 2015-03-01 DIAGNOSIS — Z880 Allergy status to penicillin: Secondary | ICD-10-CM

## 2015-03-01 DIAGNOSIS — W1839XA Other fall on same level, initial encounter: Secondary | ICD-10-CM | POA: Diagnosis present

## 2015-03-01 DIAGNOSIS — Z6820 Body mass index (BMI) 20.0-20.9, adult: Secondary | ICD-10-CM

## 2015-03-01 DIAGNOSIS — Z791 Long term (current) use of non-steroidal anti-inflammatories (NSAID): Secondary | ICD-10-CM

## 2015-03-01 DIAGNOSIS — T148XXA Other injury of unspecified body region, initial encounter: Secondary | ICD-10-CM

## 2015-03-01 DIAGNOSIS — J45909 Unspecified asthma, uncomplicated: Secondary | ICD-10-CM | POA: Diagnosis present

## 2015-03-01 DIAGNOSIS — D62 Acute posthemorrhagic anemia: Secondary | ICD-10-CM | POA: Diagnosis not present

## 2015-03-01 DIAGNOSIS — Z91128 Patient's intentional underdosing of medication regimen for other reason: Secondary | ICD-10-CM | POA: Diagnosis present

## 2015-03-01 DIAGNOSIS — Z9114 Patient's other noncompliance with medication regimen: Secondary | ICD-10-CM | POA: Diagnosis present

## 2015-03-01 DIAGNOSIS — S37011A Minor contusion of right kidney, initial encounter: Secondary | ICD-10-CM | POA: Diagnosis present

## 2015-03-01 DIAGNOSIS — G9340 Encephalopathy, unspecified: Secondary | ICD-10-CM | POA: Diagnosis not present

## 2015-03-01 DIAGNOSIS — R1031 Right lower quadrant pain: Secondary | ICD-10-CM

## 2015-03-01 DIAGNOSIS — R4182 Altered mental status, unspecified: Secondary | ICD-10-CM | POA: Diagnosis not present

## 2015-03-01 DIAGNOSIS — Z90710 Acquired absence of both cervix and uterus: Secondary | ICD-10-CM

## 2015-03-01 DIAGNOSIS — F419 Anxiety disorder, unspecified: Secondary | ICD-10-CM | POA: Diagnosis present

## 2015-03-01 DIAGNOSIS — W19XXXA Unspecified fall, initial encounter: Secondary | ICD-10-CM

## 2015-03-01 DIAGNOSIS — G40909 Epilepsy, unspecified, not intractable, without status epilepticus: Principal | ICD-10-CM | POA: Diagnosis present

## 2015-03-01 DIAGNOSIS — S37019A Minor contusion of unspecified kidney, initial encounter: Secondary | ICD-10-CM | POA: Insufficient documentation

## 2015-03-01 LAB — VALPROIC ACID LEVEL: VALPROIC ACID LVL: 42 ug/mL — AB (ref 50.0–100.0)

## 2015-03-01 LAB — RAPID URINE DRUG SCREEN, HOSP PERFORMED
Amphetamines: NOT DETECTED
BARBITURATES: NOT DETECTED
Benzodiazepines: NOT DETECTED
COCAINE: POSITIVE — AB
Opiates: NOT DETECTED
TETRAHYDROCANNABINOL: POSITIVE — AB

## 2015-03-01 LAB — URINE MICROSCOPIC-ADD ON

## 2015-03-01 LAB — COMPREHENSIVE METABOLIC PANEL
ALK PHOS: 104 U/L (ref 38–126)
ALT: 15 U/L (ref 14–54)
AST: 41 U/L (ref 15–41)
Albumin: 4 g/dL (ref 3.5–5.0)
Anion gap: 10 (ref 5–15)
BUN: 8 mg/dL (ref 6–20)
CALCIUM: 9.3 mg/dL (ref 8.9–10.3)
CHLORIDE: 106 mmol/L (ref 101–111)
CO2: 24 mmol/L (ref 22–32)
Creatinine, Ser: 1.08 mg/dL — ABNORMAL HIGH (ref 0.44–1.00)
GFR calc Af Amer: >60 mL/min (ref >60)
GFR, EST NON AFRICAN AMERICAN: 56 mL/min — AB (ref >60)
GLUCOSE: 94 mg/dL (ref 65–99)
Potassium: 4.1 mmol/L (ref 3.5–5.1)
SODIUM: 140 mmol/L (ref 135–145)
Total Bilirubin: 0.6 mg/dL (ref 0.3–1.2)
Total Protein: 7.4 g/dL (ref 6.5–8.1)

## 2015-03-01 LAB — CBC WITH DIFFERENTIAL/PLATELET
BASOS ABS: 0 10*3/uL (ref 0.0–0.1)
Basophils Relative: 0 % (ref 0–1)
EOS ABS: 0 10*3/uL (ref 0.0–0.7)
Eosinophils Relative: 0 % (ref 0–5)
HEMATOCRIT: 39.4 % (ref 36.0–46.0)
HEMOGLOBIN: 13.3 g/dL (ref 12.0–15.0)
Lymphocytes Relative: 9 % — ABNORMAL LOW (ref 12–46)
Lymphs Abs: 0.8 10*3/uL (ref 0.7–4.0)
MCH: 30.2 pg (ref 26.0–34.0)
MCHC: 33.8 g/dL (ref 30.0–36.0)
MCV: 89.5 fL (ref 78.0–100.0)
Monocytes Absolute: 0.6 10*3/uL (ref 0.1–1.0)
Monocytes Relative: 6 % (ref 3–12)
Neutro Abs: 7.5 10*3/uL (ref 1.7–7.7)
Neutrophils Relative %: 85 % — ABNORMAL HIGH (ref 43–77)
PLATELETS: 389 10*3/uL (ref 150–400)
RBC: 4.4 MIL/uL (ref 3.87–5.11)
RDW: 14.4 % (ref 11.5–15.5)
WBC: 8.9 10*3/uL (ref 4.0–10.5)

## 2015-03-01 LAB — URINALYSIS, ROUTINE W REFLEX MICROSCOPIC
Bilirubin Urine: NEGATIVE
Glucose, UA: NEGATIVE mg/dL
Ketones, ur: 15 mg/dL — AB
Leukocytes, UA: NEGATIVE
Nitrite: NEGATIVE
PH: 5.5 (ref 5.0–8.0)
Protein, ur: 100 mg/dL — AB
SPECIFIC GRAVITY, URINE: 1.015 (ref 1.005–1.030)
Urobilinogen, UA: 0.2 mg/dL (ref 0.0–1.0)

## 2015-03-01 LAB — ACETAMINOPHEN LEVEL: Acetaminophen (Tylenol), Serum: <10 ug/mL — ABNORMAL LOW (ref 10–30)

## 2015-03-01 LAB — SALICYLATE LEVEL: Salicylate Lvl: <4 mg/dL (ref 2.8–30.0)

## 2015-03-01 LAB — ETHANOL: Alcohol, Ethyl (B): <5 mg/dL (ref <5)

## 2015-03-01 MED ORDER — ALBUTEROL SULFATE HFA 108 (90 BASE) MCG/ACT IN AERS: 2.0000 | INHALATION_SPRAY | Freq: Four times a day (QID) | RESPIRATORY_TRACT | Status: DC | PRN

## 2015-03-01 MED ORDER — ADULT MULTIVITAMIN W/MINERALS CH
1.0000 | ORAL_TABLET | Freq: Every day | ORAL | Status: DC
  Administered 2015-03-03: 1 via ORAL
  Filled 2015-03-01 (×3): qty 1

## 2015-03-01 MED ORDER — THIAMINE HCL 100 MG/ML IJ SOLN
100.0000 mg | Freq: Every day | INTRAMUSCULAR | Status: DC
  Administered 2015-03-01: 100 mg via INTRAVENOUS
  Filled 2015-03-01 (×3): qty 1

## 2015-03-01 MED ORDER — LORAZEPAM 2 MG/ML IJ SOLN
2.0000 mg | Freq: Once | INTRAMUSCULAR | Status: DC
  Filled 2015-03-01: qty 1

## 2015-03-01 MED ORDER — FOLIC ACID 1 MG PO TABS
1.0000 mg | ORAL_TABLET | Freq: Every day | ORAL | Status: DC
  Administered 2015-03-03: 1 mg via ORAL
  Filled 2015-03-01 (×3): qty 1

## 2015-03-01 MED ORDER — LORAZEPAM 1 MG PO TABS: 1.0000 mg | ORAL_TABLET | Freq: Four times a day (QID) | ORAL | Status: DC | PRN

## 2015-03-01 MED ORDER — LORAZEPAM 2 MG/ML IJ SOLN
1.0000 mg | Freq: Four times a day (QID) | INTRAMUSCULAR | Status: DC | PRN
Start: 2015-03-01 — End: 2015-03-03
  Administered 2015-03-02: 1 mg via INTRAVENOUS
  Filled 2015-03-01: qty 1

## 2015-03-01 MED ORDER — SODIUM CHLORIDE 0.9 % IV SOLN
INTRAVENOUS | Status: DC
  Administered 2015-03-01 – 2015-03-05 (×2): via INTRAVENOUS

## 2015-03-01 MED ORDER — SODIUM CHLORIDE 0.9 % IV BOLUS (SEPSIS)
1000.0000 mL | Freq: Once | INTRAVENOUS | Status: AC
  Administered 2015-03-01: 1000 mL via INTRAVENOUS

## 2015-03-01 MED ORDER — VALPROATE SODIUM 500 MG/5ML IV SOLN
500.0000 mg | Freq: Once | INTRAVENOUS | Status: AC
  Administered 2015-03-01: 500 mg via INTRAVENOUS
  Filled 2015-03-01: qty 5

## 2015-03-01 MED ORDER — FOLIC ACID 1 MG PO TABS
1.0000 mg | ORAL_TABLET | Freq: Every day | ORAL | Status: DC
  Administered 2015-03-04 – 2015-03-08 (×4): 1 mg via ORAL
  Filled 2015-03-01 (×6): qty 1

## 2015-03-01 MED ORDER — LORAZEPAM 2 MG/ML IJ SOLN
1.0000 mg | Freq: Once | INTRAMUSCULAR | Status: AC
  Administered 2015-03-01: 1 mg via INTRAVENOUS

## 2015-03-01 MED ORDER — ALBUTEROL SULFATE (2.5 MG/3ML) 0.083% IN NEBU
2.5000 mg | INHALATION_SOLUTION | Freq: Four times a day (QID) | RESPIRATORY_TRACT | Status: DC | PRN
Start: 2015-03-01 — End: 2015-03-03

## 2015-03-01 MED ORDER — LEVETIRACETAM 500 MG/5ML IV SOLN
750.0000 mg | Freq: Two times a day (BID) | INTRAVENOUS | Status: DC
  Administered 2015-03-02 – 2015-03-03 (×3): 750 mg via INTRAVENOUS
  Filled 2015-03-01 (×5): qty 7.5

## 2015-03-01 MED ORDER — LORAZEPAM 1 MG PO TABS: 2.0000 mg | ORAL_TABLET | Freq: Four times a day (QID) | ORAL | Status: DC | PRN

## 2015-03-01 MED ORDER — HEPARIN SODIUM (PORCINE) 5000 UNIT/ML IJ SOLN
5000.0000 [IU] | Freq: Three times a day (TID) | INTRAMUSCULAR | Status: DC
  Administered 2015-03-01 – 2015-03-03 (×6): 5000 [IU] via SUBCUTANEOUS
  Filled 2015-03-01 (×7): qty 1

## 2015-03-01 MED ORDER — VITAMIN B-1 100 MG PO TABS
100.0000 mg | ORAL_TABLET | Freq: Every day | ORAL | Status: DC
  Administered 2015-03-03 – 2015-03-08 (×6): 100 mg via ORAL
  Filled 2015-03-01 (×8): qty 1

## 2015-03-01 MED ORDER — LORAZEPAM 2 MG/ML IJ SOLN
2.0000 mg | Freq: Once | INTRAMUSCULAR | Status: AC
  Administered 2015-03-01: 2 mg via INTRAVENOUS
  Filled 2015-03-01: qty 1

## 2015-03-01 MED ORDER — LORAZEPAM 2 MG/ML IJ SOLN
1.0000 mg | Freq: Once | INTRAMUSCULAR | Status: AC
  Filled 2015-03-01: qty 1

## 2015-03-01 MED ORDER — ENSURE ENLIVE PO LIQD
237.0000 mL | Freq: Two times a day (BID) | ORAL | Status: DC
  Administered 2015-03-02 – 2015-03-08 (×10): 237 mL via ORAL

## 2015-03-01 MED ORDER — ACETAMINOPHEN 500 MG PO TABS: 1000.0000 mg | ORAL_TABLET | Freq: Four times a day (QID) | ORAL | Status: DC | PRN

## 2015-03-01 MED ORDER — SODIUM CHLORIDE 0.9 % IV SOLN
1000.0000 mg | Freq: Once | INTRAVENOUS | Status: AC
  Administered 2015-03-01: 1000 mg via INTRAVENOUS
  Filled 2015-03-01: qty 10

## 2015-03-01 MED ORDER — VALPROATE SODIUM 500 MG/5ML IV SOLN
500.0000 mg | Freq: Two times a day (BID) | INTRAVENOUS | Status: DC
  Administered 2015-03-02 – 2015-03-03 (×3): 500 mg via INTRAVENOUS
  Filled 2015-03-01 (×4): qty 5

## 2015-03-01 MED ORDER — SODIUM CHLORIDE 0.9 % IJ SOLN
3.0000 mL | Freq: Two times a day (BID) | INTRAMUSCULAR | Status: DC
  Administered 2015-03-01 – 2015-03-07 (×7): 3 mL via INTRAVENOUS

## 2015-03-01 MED ORDER — ENSURE COMPLETE PO LIQD: 237.0000 mL | Freq: Two times a day (BID) | ORAL | Status: DC

## 2015-03-01 MED ORDER — ACETAMINOPHEN 650 MG RE SUPP
650.0000 mg | Freq: Once | RECTAL | Status: AC
  Administered 2015-03-01: 650 mg via RECTAL
  Filled 2015-03-01: qty 1

## 2015-03-01 MED ORDER — DICLOFENAC SODIUM 1 % TD GEL
4.0000 g | Freq: Four times a day (QID) | TRANSDERMAL | Status: DC
  Administered 2015-03-02 – 2015-03-08 (×22): 4 g via TOPICAL
  Filled 2015-03-01: qty 100

## 2015-03-01 MED ORDER — MAGNESIUM OXIDE 400 (241.3 MG) MG PO TABS
400.0000 mg | ORAL_TABLET | Freq: Two times a day (BID) | ORAL | Status: DC
  Administered 2015-03-03 – 2015-03-08 (×11): 400 mg via ORAL
  Filled 2015-03-01 (×15): qty 1

## 2015-03-01 NOTE — ED Notes (Signed)
Attempted an in and out catth but patient too combative.

## 2015-03-01 NOTE — Progress Notes (Signed)
MD paged that patient L eye non-reactive on neuro check.  Lindsey [name] stated she will let the team know.  Will continue to monitor patient.

## 2015-03-01 NOTE — ED Notes (Signed)
Dr Stewart at bedside.

## 2015-03-01 NOTE — Progress Notes (Signed)
MD notified due to acute change in patient status. Vital signs and labs are listed below.  Responding MD:  Dr. Alcario Drought Time MD responded: 2155 MD response: Move patient to step-down for closer monitoring.  Vital Signs Filed Vitals:   03/01/15 1916 03/01/15 2000 03/01/15 2037 03/01/15 2124  BP: 133/71 132/78  108/89  Pulse: 108 106  107  Temp:   101.5 F (38.6 C) 99.3 F (37.4 C)  TempSrc:   Rectal Oral  Resp: 19 22  17   SpO2: 100% 99%  100%     Lab Results WBC  Date/Time Value Ref Range Status  03/01/2015 04:55 PM 8.9 4.0 - 10.5 K/uL Final  02/06/2015 11:50 AM 7.4 4.0 - 10.5 K/uL Final  02/06/2015 03:49 AM 6.9 4.0 - 10.5 K/uL Final  01/28/2014 06:26 PM 9.6 3.6-11.0 x10 3/mm 3 Final  10/16/2013 04:39 AM 7.5 3.6-11.0 x10 3/mm 3 Final  10/15/2013 01:26 PM 8.5 3.6-11.0 x10 3/mm 3 Final   NEUTROPHIL %  Date/Time Value Ref Range Status  10/16/2013 04:39 AM 54.2 % Final   NEUTROPHILS RELATIVE %  Date/Time Value Ref Range Status  03/01/2015 04:55 PM 85* 43 - 77 % Final  02/06/2015 03:49 AM 51 43 - 77 % Final  01/18/2015 06:10 AM 80* 43 - 77 % Final   No results found for: PCO2ART LACTIC ACID, VENOUS  Date/Time Value Ref Range Status  01/18/2015 03:44 PM 1.4 0.5 - 2.0 mmol/L Final  01/18/2015 09:25 AM 2.22* 0.5 - 2.0 mmol/L Final  03/05/2014 01:35 AM 0.9 0.5 - 2.2 mmol/L Final   No results found for: PCO2VEN   [name], RN 03/01/2015, 9:45 PM

## 2015-03-01 NOTE — Consult Note (Signed)
Admission H&P    Chief Complaint: Recurrent generalized seizures.  HPI: Lindsey [name] is an 59 y.o. female with a history of seizures and polysubstance abuse as well as poor compliance with taking anticonvulsive medications, presenting with recurrent generalized seizures. Patient has had witnessed seizures today including a generalized seizure in the emergency room. She was hospitalized on 02/07/2015 with a very similar presentation. He has been prescribed Depakote and Keppra. On previous admission Depakote level was low. He was discharged on Depakote 500 mg twice a day and Keppra 750 mg twice a day. Family indicates that she may not have been compliant start with taking medications. She was given Ativan 2 mg 2 and the ED. Keppra 1000 mg IV was ordered.  Past Medical History  Diagnosis Date  . Seizures   . Asthma     Past Surgical History  Procedure Laterality Date  . Abdominal hysterectomy     Family history: Family history on file and unavailable due to patient's mental status.  Social History:  reports that she has never smoked. She has never used smokeless tobacco. She reports that she drinks alcohol. She reports that she uses illicit drugs (Marijuana).  Allergies:  Allergies  Allergen Reactions  . Morphine And Related Hives  . Penicillins Nausea Only   Medications: Patient's admission medications were reviewed by me.  ROS: Available due to patient's mental status.  Physical Examination: Blood pressure 150/97, pulse 94, temperature 99.4 F (37.4 C), temperature source Axillary, resp. rate 20, SpO2 95 %.  HEENT-  Normocephalic, no lesions, without obvious abnormality.  Normal external eye and conjunctiva.  Normal TM's bilaterally.  Normal auditory canals and external ears. Normal external nose, mucus membranes and septum.  Normal pharynx. Neck supple with no masses, nodes, nodules or enlargement. Cardiovascular - regular rate and rhythm, S1, S2 normal, no murmur, click,  rub or gallop Lungs - chest clear, no wheezing, rales, normal symmetric air entry Abdomen - soft, non-tender; bowel sounds normal; no masses,  no organomegaly Extremities - no joint deformities, effusion, or inflammation and no edema  Neurologic Examination: Patient was obtunded and moderately agitated. She had no verbal output and did not follow commands. Pupils were equal and reacted normally to light. Extraocular movements were intact with oculocephalic maneuvers. No facial weakness was noted. Left periorbital ecchymosis and swelling noted. Patient moved extremities symmetrically with good strength. Withdrawal movements were equal. Deep tendon reflexes were normal and symmetrical. Plantar responses were flexor bilaterally.  Results for orders placed or performed during the hospital encounter of 03/01/15 (from the past 48 hour(s))  CBC with Differential     Status: Abnormal   Collection Time: 03/01/15  4:55 PM  Result Value Ref Range   WBC 8.9 4.0 - 10.5 K/uL   RBC 4.40 3.87 - 5.11 MIL/uL   Hemoglobin 13.3 12.0 - 15.0 g/dL   HCT 39.4 36.0 - 46.0 %   MCV 89.5 78.0 - 100.0 fL   MCH 30.2 26.0 - 34.0 pg   MCHC 33.8 30.0 - 36.0 g/dL   RDW 14.4 11.5 - 15.5 %   Platelets 389 150 - 400 K/uL   Neutrophils Relative % 85 (H) 43 - 77 %   Neutro Abs 7.5 1.7 - 7.7 K/uL   Lymphocytes Relative 9 (L) 12 - 46 %   Lymphs Abs 0.8 0.7 - 4.0 K/uL   Monocytes Relative 6 3 - 12 %   Monocytes Absolute 0.6 0.1 - 1.0 K/uL   Eosinophils Relative 0 0 - 5 %  Eosinophils Absolute 0.0 0.0 - 0.7 K/uL   Basophils Relative 0 0 - 1 %   Basophils Absolute 0.0 0.0 - 0.1 K/uL   No results found.  Assessment/Plan 59 year old lady with a history of seizure disorder and poor compliance with treatment, as well as polysubstance abuse, presenting with recurrent generalized seizures. Depakote level is pending. Urine drug screen is also pending.   Recommend initial management with current dose of Keppra 750 mg twice  a day and Depakote 500 mg twice a day, administered IV or by mouth. Doses will be adjusted as needed depending on doses needed for seizure control and ongoing lab results.  We will continue follow this patient with you.  C.R. Nicole Kindred, [place] [facility] [phone]  03/01/2015, 6:15 PM

## 2015-03-01 NOTE — ED Notes (Signed)
Portable xray at bedside.
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
## 2015-03-01 NOTE — ED Notes (Signed)
XRay called and asked to take pt to CT scan once xrays are completed.

## 2015-03-01 NOTE — ED Notes (Addendum)
PER EMS: pt from home. Hx of seizures and takes Depakote. She had one seizure this morning and then 2 seizures this afternoon and she fell in the bathroom and incontinent of stool. No seizure activity with EMS but pt had been post ictal en route. Pt becoming more alert upon arrival. Pt points to her right hip when asked where her pain is and her left eye. BP-170/90, CBG-104, HR-100 regular, 97% RA. Hematoma over left eye. C-collar in place.

## 2015-03-01 NOTE — ED Notes (Signed)
Pt unable to lay down for hip xray so she was brought back over to her room in [facility]. Upon arrival, pt had another generalized tonic clonic seizure lasting about 30 seconds witnessed by Dr. Darl Householder. O2 decreased to 88% on RA and 2L [place] placed on patient. Pt is now post ictal, not answering questions moaning and breathing hard. 2mg  ativan given IV.

## 2015-03-01 NOTE — ED Provider Notes (Signed)
CSN: [id number]     Arrival date & time 03/01/15  1636 History   First MD Initiated Contact with Patient 03/01/15 1640     Chief Complaint  Patient presents with  . Seizures  . Fall     (Consider location/radiation/quality/duration/timing/severity/associated sxs/prior Treatment) The history is provided by the EMS personnel and a relative.  Lindsey [name] is a 59 y.o. female hx of seizure, asthma here presenting with another seizure. Patient had one episode of seizure this morning that resolved. This afternoon, patient had 2 back-to-back seizures with no return to baseline. Patient also was noted to have grand mal seizures as per family. Patient also was noted to have right hip pain and swell as left forehead hematoma. She also had bowel movement on herself when she had a seizure. Of note, patient was admitted a month ago for seizures that was likely from polysubstance abuse and medication compliance. Per the family, patient has been taking her Depakote and Keppra.   Level V caveat- post ictal    Past Medical History  Diagnosis Date  . Seizures   . Asthma    Past Surgical History  Procedure Laterality Date  . Abdominal hysterectomy     No family history on file. History  Substance Use Topics  . Smoking status: Never Smoker   . Smokeless tobacco: Never Used  . Alcohol Use: Yes     Comment: drinks heavily. Agrees with drinking every other day.    OB History    No data available     Review of Systems  Neurological: Positive for seizures.  All other systems reviewed and are negative.     Allergies  Morphine and related and Penicillins  Home Medications   Prior to Admission medications   Medication Sig Start Date End Date Taking? Authorizing Provider  acetaminophen (TYLENOL) 500 MG tablet Take 1,000 mg by mouth every 6 (six) hours as needed for moderate pain.     Historical Provider, MD  albuterol (PROVENTIL HFA;VENTOLIN HFA) 108 (90 BASE) MCG/ACT inhaler Inhale 2 puffs  into the lungs every 6 (six) hours as needed for wheezing or shortness of breath. 01/19/15   Shanker Kristeen Mans, MD  clindamycin (CLEOCIN) 300 MG capsule Take 1 capsule (300 mg total) by mouth every 8 (eight) hours. 02/07/15   Belkys A Regalado, MD  diclofenac sodium (VOLTAREN) 1 % GEL Apply 4 g topically 4 (four) times daily.    Historical Provider, MD  divalproex (DEPAKOTE) 500 MG DR tablet Take 1 tablet (500 mg total) by mouth every 12 (twelve) hours. 02/07/15   Belkys A Regalado, MD  feeding supplement, ENSURE COMPLETE, (ENSURE COMPLETE) LIQD Take 237 mLs by mouth 2 (two) times daily between meals. 01/19/15   Shanker Kristeen Mans, MD  folic acid (FOLVITE) 1 MG tablet Take 1 tablet (1 mg total) by mouth daily. 01/19/15   Shanker Kristeen Mans, MD  levETIRAcetam (KEPPRA) 750 MG tablet Take 1 tablet (750 mg total) by mouth 2 (two) times daily. 02/07/15   Belkys A Regalado, MD  LORazepam (ATIVAN) 2 MG tablet Take 2 mg by mouth every 6 (six) hours as needed for anxiety.    Historical Provider, MD  magnesium oxide (MAG-OX) 400 (241.3 MG) MG tablet Take 1 tablet (400 mg total) by mouth 2 (two) times daily. 02/07/15   Belkys A Regalado, MD   BP 133/71 mmHg  Pulse 108  Temp(Src) 99.4 F (37.4 C) (Axillary)  Resp 19  SpO2 100% Physical Exam  Constitutional:  Altered,  awake, no seizure activity   HENT:  Head: Normocephalic.  L forehead hematoma. No obvious laceration   Eyes: Conjunctivae are normal. Pupils are equal, round, and reactive to light.  Neck: Normal range of motion. Neck supple.  Cardiovascular: Normal rate, regular rhythm and normal heart sounds.   Pulmonary/Chest: Effort normal and breath sounds normal. No respiratory distress. She has no wheezes. She has no rales.  Abdominal: Soft. Bowel sounds are normal. She exhibits no distension. There is no tenderness. There is no rebound.  Musculoskeletal:  R hip dec ROM, no obvious deformity   Neurological:  Altered, not talking. No obvious eye  deviation. Moving all extremities   Skin: Skin is warm and dry.  Psychiatric:  Unable   Nursing note and vitals reviewed.   ED Course  Procedures (including critical care time) Labs Review Labs Reviewed  CBC WITH DIFFERENTIAL/PLATELET - Abnormal; Notable for the following:    Neutrophils Relative % 85 (*)    Lymphocytes Relative 9 (*)    All other components within normal limits  COMPREHENSIVE METABOLIC PANEL - Abnormal; Notable for the following:    Creatinine, Ser 1.08 (*)    GFR calc non Af Amer 56 (*)    All other components within normal limits  VALPROIC ACID LEVEL - Abnormal; Notable for the following:    Valproic Acid Lvl 42 (*)    All other components within normal limits  ACETAMINOPHEN LEVEL - Abnormal; Notable for the following:    Acetaminophen (Tylenol), Serum <10 (*)    All other components within normal limits  ETHANOL  SALICYLATE LEVEL  URINE RAPID DRUG SCREEN (HOSP PERFORMED) NOT AT [facility]  URINALYSIS, ROUTINE W REFLEX MICROSCOPIC (NOT AT [facility])    Imaging Review Dg Chest 1 View  03/01/2015   CLINICAL DATA:  Seizures.  Fall in bathroom.  EXAM: CHEST  1 VIEW  COMPARISON:  02/06/2015  FINDINGS: Normal cardiac silhouette. No effusion, infiltrate, or pneumothorax. No acute osseous abnormality.  IMPRESSION: No acute cardiopulmonary process.   Electronically Signed   By: Suzy Bouchard M.D.   On: 03/01/2015 18:19   Ct Head Wo Contrast  03/01/2015   CLINICAL DATA:  Acute onset seizures. Status post fall in bathroom. Hematoma over the left orbit. Concern for cervical spine injury. Initial encounter.  EXAM: CT HEAD WITHOUT CONTRAST  CT CERVICAL SPINE WITHOUT CONTRAST  TECHNIQUE: Multidetector CT imaging of the head and cervical spine was performed following the standard protocol without intravenous contrast. Multiplanar CT image reconstructions of the cervical spine were also generated.  COMPARISON:  CT of the head performed 02/06/2015, and CT of the cervical spine performed  12/29/2005  FINDINGS: CT HEAD FINDINGS  There is no evidence of acute infarction, mass lesion, or intra- or extra-axial hemorrhage on CT.  Prominence of the ventricles and sulci reflects mild cortical volume loss. Mild cerebellar atrophy is noted.  The brainstem and fourth ventricle are within normal limits. The basal ganglia are unremarkable in appearance. The cerebral hemispheres demonstrate grossly normal gray-white differentiation. No mass effect or midline shift is seen.  There is no evidence of fracture; visualized osseous structures are unremarkable in appearance. The visualized portions of the orbits are within normal limits. The paranasal sinuses and mastoid air cells are well-aerated. Mild soft tissue swelling is noted lateral to the left orbit.  CT CERVICAL SPINE FINDINGS  There is no evidence of acute fracture or subluxation. Vertebral bodies demonstrate normal height. Chronic deformity at the right-sided facet at C5-C6 likely reflects  remote traumatic injury. An associated osseous fragment extends partially into the spinal canal. This appearance is stable from 2007. There is also mild grade 1 retrolisthesis of C4 on C5. An accessory osseous fragment is noted superior to the dens, apparently new from 2007 and likely degenerative in nature. Intervertebral disc spaces are preserved. Prevertebral soft tissues are within normal limits.  A 0.9 cm hypodensity within the right thyroid lobe is likely benign, given its size. Mild emphysematous change is noted at the lung apices. No significant soft tissue abnormalities are seen.  IMPRESSION: 1. No evidence of traumatic intracranial injury or fracture. 2. No evidence of acute fracture or subluxation along the cervical spine. 3. Mild soft tissue swelling noted lateral to the left orbit. 4. Chronic deformity of the right-sided facet at C5-C6 likely reflects remote traumatic injury. Associated osseous fragment extends partially into the spinal canal, stable from  2007. Mild grade 1 retrolisthesis of C4 on C5. 5. Mild cortical volume loss noted.   Electronically Signed   By: Garald Balding M.D.   On: 03/01/2015 19:41   Ct Cervical Spine Wo Contrast  03/01/2015   CLINICAL DATA:  Acute onset seizures. Status post fall in bathroom. Hematoma over the left orbit. Concern for cervical spine injury. Initial encounter.  EXAM: CT HEAD WITHOUT CONTRAST  CT CERVICAL SPINE WITHOUT CONTRAST  TECHNIQUE: Multidetector CT imaging of the head and cervical spine was performed following the standard protocol without intravenous contrast. Multiplanar CT image reconstructions of the cervical spine were also generated.  COMPARISON:  CT of the head performed 02/06/2015, and CT of the cervical spine performed 12/29/2005  FINDINGS: CT HEAD FINDINGS  There is no evidence of acute infarction, mass lesion, or intra- or extra-axial hemorrhage on CT.  Prominence of the ventricles and sulci reflects mild cortical volume loss. Mild cerebellar atrophy is noted.  The brainstem and fourth ventricle are within normal limits. The basal ganglia are unremarkable in appearance. The cerebral hemispheres demonstrate grossly normal gray-white differentiation. No mass effect or midline shift is seen.  There is no evidence of fracture; visualized osseous structures are unremarkable in appearance. The visualized portions of the orbits are within normal limits. The paranasal sinuses and mastoid air cells are well-aerated. Mild soft tissue swelling is noted lateral to the left orbit.  CT CERVICAL SPINE FINDINGS  There is no evidence of acute fracture or subluxation. Vertebral bodies demonstrate normal height. Chronic deformity at the right-sided facet at C5-C6 likely reflects remote traumatic injury. An associated osseous fragment extends partially into the spinal canal. This appearance is stable from 2007. There is also mild grade 1 retrolisthesis of C4 on C5. An accessory osseous fragment is noted superior to the dens,  apparently new from 2007 and likely degenerative in nature. Intervertebral disc spaces are preserved. Prevertebral soft tissues are within normal limits.  A 0.9 cm hypodensity within the right thyroid lobe is likely benign, given its size. Mild emphysematous change is noted at the lung apices. No significant soft tissue abnormalities are seen.  IMPRESSION: 1. No evidence of traumatic intracranial injury or fracture. 2. No evidence of acute fracture or subluxation along the cervical spine. 3. Mild soft tissue swelling noted lateral to the left orbit. 4. Chronic deformity of the right-sided facet at C5-C6 likely reflects remote traumatic injury. 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 Mild grade 1 retrolisthesis of C4 on C5. 5. Mild cortical volume loss noted.   Electronically Signed   By: Jacqulynn Cadet  Chang M.D.   On: 03/01/2015 19:41   Dg Pelvis Portable  03/01/2015   CLINICAL DATA:  The patient fell secondary to a seizure.  EXAM: PORTABLE PELVIS 1-2 VIEWS  COMPARISON:  Hip radiographs dated 03/18/2008  FINDINGS: There is no fracture or dislocation. There is slight arthritis of the left hip with marginal osteophytes on the inferior aspect of the acetabulum and on the femoral head. No joint space narrowing.  IMPRESSION: No acute abnormality.   Electronically Signed   By: Lorriane Shire M.D.   On: 03/01/2015 19:17     EKG Interpretation   Date/Time:  Saturday March 01 2015 16:57:56 EDT Ventricular Rate:  100 PR Interval:  136 QRS Duration: 77 QT Interval:  414 QTC Calculation: 534 R Axis:   88 Text Interpretation:  Sinus tachycardia Right atrial enlargement Consider  left ventricular hypertrophy Prolonged QT interval No significant change  since last tracing Confirmed by YAO  MD, DAVID ([id number]) on 03/01/2015 5:03:39  PM      MDM   Final diagnoses:  Fall    Lindsey [name] is a 59 y.o. female here presenting with seizure. Currently still altered and may  be post ictal. We will check labs, UDS, Depakote level. Suspect medication noncompliance again.   6 PM Patient had another seizure. Given ativan. Neurology consulted and recommend loading with keppra. Depakote level pending so will hold off on loading with depakote.   7:50 PM Depakote level slightly low. Dr. Nicole Kindred recommend loading with depakote 500 mg IV. Still altered. Unable to get UDS yet. Will admit for seizures likely from medication uncompliance.     Wandra Arthurs, MD 03/01/15 [id number]

## 2015-03-01 NOTE — Progress Notes (Signed)
Attempted to notify family about patient moving to 3S; message left to call this nurse back.  Will await call.

## 2015-03-01 NOTE — ED Notes (Signed)
Number to contact for family 336 [phone].

## 2015-03-02 DIAGNOSIS — R1031 Right lower quadrant pain: Secondary | ICD-10-CM | POA: Diagnosis not present

## 2015-03-02 DIAGNOSIS — S0512XA Contusion of eyeball and orbital tissues, left eye, initial encounter: Secondary | ICD-10-CM | POA: Diagnosis present

## 2015-03-02 DIAGNOSIS — R569 Unspecified convulsions: Secondary | ICD-10-CM | POA: Diagnosis present

## 2015-03-02 DIAGNOSIS — S0990XA Unspecified injury of head, initial encounter: Secondary | ICD-10-CM | POA: Diagnosis not present

## 2015-03-02 DIAGNOSIS — Z6820 Body mass index (BMI) 20.0-20.9, adult: Secondary | ICD-10-CM | POA: Diagnosis not present

## 2015-03-02 DIAGNOSIS — A047 Enterocolitis due to Clostridium difficile: Secondary | ICD-10-CM | POA: Diagnosis not present

## 2015-03-02 DIAGNOSIS — Z9114 Patient's other noncompliance with medication regimen: Secondary | ICD-10-CM | POA: Diagnosis present

## 2015-03-02 DIAGNOSIS — S0990XD Unspecified injury of head, subsequent encounter: Secondary | ICD-10-CM | POA: Diagnosis not present

## 2015-03-02 DIAGNOSIS — D62 Acute posthemorrhagic anemia: Secondary | ICD-10-CM | POA: Diagnosis not present

## 2015-03-02 DIAGNOSIS — Z79899 Other long term (current) drug therapy: Secondary | ICD-10-CM | POA: Diagnosis not present

## 2015-03-02 DIAGNOSIS — T426X6A Underdosing of other antiepileptic and sedative-hypnotic drugs, initial encounter: Secondary | ICD-10-CM | POA: Diagnosis present

## 2015-03-02 DIAGNOSIS — W1839XA Other fall on same level, initial encounter: Secondary | ICD-10-CM | POA: Diagnosis present

## 2015-03-02 DIAGNOSIS — N2889 Other specified disorders of kidney and ureter: Secondary | ICD-10-CM | POA: Diagnosis not present

## 2015-03-02 DIAGNOSIS — Z791 Long term (current) use of non-steroidal anti-inflammatories (NSAID): Secondary | ICD-10-CM | POA: Diagnosis not present

## 2015-03-02 DIAGNOSIS — Z885 Allergy status to narcotic agent status: Secondary | ICD-10-CM | POA: Diagnosis not present

## 2015-03-02 DIAGNOSIS — S37011A Minor contusion of right kidney, initial encounter: Secondary | ICD-10-CM | POA: Diagnosis present

## 2015-03-02 DIAGNOSIS — Z91128 Patient's intentional underdosing of medication regimen for other reason: Secondary | ICD-10-CM | POA: Diagnosis present

## 2015-03-02 DIAGNOSIS — Z880 Allergy status to penicillin: Secondary | ICD-10-CM | POA: Diagnosis not present

## 2015-03-02 DIAGNOSIS — Y92002 Bathroom of unspecified non-institutional (private) residence single-family (private) house as the place of occurrence of the external cause: Secondary | ICD-10-CM | POA: Diagnosis not present

## 2015-03-02 DIAGNOSIS — Z90710 Acquired absence of both cervix and uterus: Secondary | ICD-10-CM | POA: Diagnosis not present

## 2015-03-02 DIAGNOSIS — F1910 Other psychoactive substance abuse, uncomplicated: Secondary | ICD-10-CM | POA: Diagnosis not present

## 2015-03-02 DIAGNOSIS — F419 Anxiety disorder, unspecified: Secondary | ICD-10-CM | POA: Diagnosis present

## 2015-03-02 DIAGNOSIS — G9340 Encephalopathy, unspecified: Secondary | ICD-10-CM | POA: Diagnosis not present

## 2015-03-02 DIAGNOSIS — J45909 Unspecified asthma, uncomplicated: Secondary | ICD-10-CM | POA: Diagnosis present

## 2015-03-02 DIAGNOSIS — M25551 Pain in right hip: Secondary | ICD-10-CM | POA: Diagnosis present

## 2015-03-02 DIAGNOSIS — G40909 Epilepsy, unspecified, not intractable, without status epilepticus: Secondary | ICD-10-CM | POA: Diagnosis present

## 2015-03-02 NOTE — Progress Notes (Signed)
Subjective: No recurrence of seizure activity reported. Depakote level was low at 42. She was given a 500 mg loading of Depacon IV, and continued on Depakote 500 mg twice a day. Keppra was resumed at 750 mg twice a day following a 1000 mg IV loading dose. Patient has been lethargic, but arousable and oriented. Urine drug screen was positive for cocaine and THC.  Objective: Current vital signs: BP 110/69 mmHg  Pulse 111  Temp(Src) 99.3 F (37.4 C) (Axillary)  Resp 21  Ht 5\' 8"  (1.727 m)  Wt 61.1 kg (134 lb 11.2 oz)  BMI 20.49 kg/m2  SpO2 100%  Neurologic Exam: Patient was sleeping but could be aroused easily. He was in no acute distress. She was oriented to current age as well as correct month. She was slightly disoriented to place. She didn't know she was in the hospital, however. Extraocular movements were full and conjugate. The facial weakness was noted. He should moved extremities equally with normal strength throughout.  Medications: I have reviewed the patient's current medications.  Assessment/Plan: 59 year old lady with a history of seizure disorder and compliance issues with treatment, as well as polysubstance abuse, presenting with recurrent generalized seizures. She appears to be stable at this point with no recurrence of seizure activity after admission initial treatment with anticonvulsive medication in the ED.  Recommend no changes in current management. Further neurological intervention is indicated acutely. We will see her in follow-up on an as-needed basis.  C.R. Nicole Kindred, MD [facility] [phone]  03/02/2015  9:32 AM

## 2015-03-02 NOTE — Progress Notes (Signed)
UR COMPLETED  

## 2015-03-02 NOTE — Progress Notes (Signed)
[facility] TEAM 1 - Stepdown/ICU TEAM Progress Note  Lindsey [name] YTK:[id number] DOB: [birth year] DOA: 03/01/2015 PCP: [name], MD  Admit HPI / Brief Narrative: Lindsey [name] is a 59 y.o. BF PMHx Seizures and Polysubstance abuse, asthma, and Poor compliance with anti-seizure meds.   Presents to the ED today with multiple episodes of generalized seizures. Witnessed seizures today including one in the ED. Was hospitalized on 5/13 with similar presentation. At that time depakote level was low. Discharged on depakote and keppra. Family indicates to neurologist today that she may not have been compliant with her meds.  HPI/Subjective: 6/5 A/O 4, admits she doesn't know if she's been taking her medication daily. States did take her medication yesterday. Does not recall seizure/falling.  Assessment/Plan:  Seizure  -Multifactorial to include noncompliance with seizure medication and substance abuse -Continue Keppra 750 mg BID -Continue valproate 500 mg BID -Ativan 2 mg QID PRN anxiety -NPO until she passes swallow study  C-spine/head trauma -CT C-spine; shows no acute traumatic fractures, DC c-collar  Encephalopathy -Post-ictal state, resolving -Seizure precautions  Polysubstance abuse -UDS positive for marijuana. However one month ago positive for both cocaine and marijuana. -Negative for alcohol.     Code Status: FULL Family Communication: no family present at time of exam Disposition Plan:     Consultants: NA  Procedure/Significant Events: 6/4 CT head without contrast/C-spine without contrast;-No traumatic intracranial injury or fracture.-No  acute fracture or subluxation along C-spine  -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 -Mild grade 1 retrolisthesis of C4 on C5.    Culture NA  Antibiotics: NA  DVT  prophylaxis: Subcutaneous heparin  Devices    LINES / TUBES:      Continuous Infusions: . sodium chloride 75 mL/hr at 03/02/15 0700    Objective: VITAL SIGNS: Temp: 98.3 F (36.8 C) (06/05 1930) Temp Source: Oral (06/05 1930) BP: 112/61 mmHg (06/05 1930) Pulse Rate: 99 (06/05 1930) SPO2; FIO2:   Intake/Output Summary (Last 24 hours) at 03/02/15 2044 Last data filed at 03/02/15 1930  Gross per 24 hour  Intake 623.75 ml  Output    500 ml  Net 123.75 ml     Exam: General: A/O 4, NAD, No acute respiratory distress Eyes: Positive left eye/cheek ecchymosis, Negative headache, eye pain, negative retinal hemorrhage ENT: Negative Runny nose, extremely poor dentition, negative gingival bleeding, Neck:  Ecchymosis along its. Portion left lateral neck under mandible consistent with a fall, Negative scars, masses, torticollis, lymphadenopathy, JVD Lungs: Clear to auscultation bilaterally without wheezes or crackles Cardiovascular: Tachycardic, Regular rhythm without murmur gallop or rub normal S1 and S2 Abdomen:negative abdominal pain, negative dysphagia, Nontender, nondistended, soft, bowel sounds positive, no rebound, no ascites, no appreciable mass Extremities: No significant cyanosis, clubbing, or edema bilateral lower extremities Psychiatric:  Negative depression, negative anxiety, negative fatigue, negative mania  Neurologic:  Cranial nerves II through XII intact, tongue/uvula midline, all extremities muscle strength 5/5, sensation intact throughout, negative dysarthria, negative expressive aphasia, negative receptive aphasia. Did not ambulate patient    Data Reviewed: Basic Metabolic Panel:  Recent Labs Lab 03/01/15 1655  NA 140  K 4.1  CL 106  CO2 24  GLUCOSE 94  BUN 8  CREATININE 1.08*  CALCIUM 9.3   Liver Function Tests:  Recent Labs Lab 03/01/15 1655  AST 41  ALT 15  ALKPHOS 104  BILITOT 0.6  PROT 7.4  ALBUMIN 4.0   No  results for input(s):  LIPASE, AMYLASE in the last 168 hours. No results for input(s): AMMONIA in the last 168 hours. CBC:  Recent Labs Lab 03/01/15 1655  WBC 8.9  NEUTROABS 7.5  HGB 13.3  HCT 39.4  MCV 89.5  PLT 389   Cardiac Enzymes: No results for input(s): CKTOTAL, CKMB, CKMBINDEX, TROPONINI in the last 168 hours. BNP (last 3 results) No results for input(s): BNP in the last 8760 hours.  ProBNP (last 3 results) No results for input(s): PROBNP in the last 8760 hours.  CBG: No results for input(s): GLUCAP in the last 168 hours.  No results found for this or any previous visit (from the past 240 hour(s)).   Studies:  Recent x-ray studies have been reviewed in detail by the Attending Physician  Scheduled Meds:  Scheduled Meds: . diclofenac sodium  4 g Topical QID  . feeding supplement (ENSURE ENLIVE)  237 mL Oral BID BM  . folic acid  1 mg Oral Daily  . folic acid  1 mg Oral Daily  . heparin  5,000 Units Subcutaneous 3 times per day  . levETIRAcetam  750 mg Intravenous Q12H  . magnesium oxide  400 mg Oral BID  . multivitamin with minerals  1 tablet Oral Daily  . sodium chloride  3 mL Intravenous Q12H  . thiamine  100 mg Oral Daily   Or  . thiamine  100 mg Intravenous Daily  . valproate sodium  500 mg Intravenous Q12H    Time spent on care of this patient: 40 mins   WOODS, Geraldo Docker , MD  [facility] Office  [phone] Pager [phone]  On-Call/Text Page:      [email or web address]      password [email or web address]  If 7PM-7AM, please contact night-coverage [email or web address] Password [email or web address] 03/02/2015, 8:44 PM   LOS: 0 days   Care during the described time interval was provided by me .  I have reviewed this patient's available data, including medical history, events of note, physical examination, and all test results as part of my evaluation. I have personally reviewed and interpreted all radiology studies.   Dia Crawford, MD [phone] Pager

## 2015-03-03 ENCOUNTER — Inpatient Hospital Stay (HOSPITAL_COMMUNITY): Payer: Medicaid Other

## 2015-03-03 ENCOUNTER — Encounter (HOSPITAL_COMMUNITY): Payer: Self-pay | Admitting: Radiology

## 2015-03-03 DIAGNOSIS — S0990XD Unspecified injury of head, subsequent encounter: Secondary | ICD-10-CM

## 2015-03-03 DIAGNOSIS — G8929 Other chronic pain: Secondary | ICD-10-CM

## 2015-03-03 DIAGNOSIS — R1031 Right lower quadrant pain: Secondary | ICD-10-CM

## 2015-03-03 DIAGNOSIS — G9340 Encephalopathy, unspecified: Secondary | ICD-10-CM

## 2015-03-03 LAB — CLOSTRIDIUM DIFFICILE BY PCR: CDIFFPCR: POSITIVE — AB

## 2015-03-03 MED ORDER — IOHEXOL 300 MG/ML  SOLN
100.0000 mL | Freq: Once | INTRAMUSCULAR | Status: AC | PRN
  Administered 2015-03-03: 100 mL via INTRAVENOUS

## 2015-03-03 MED ORDER — ADULT MULTIVITAMIN W/MINERALS CH
1.0000 | ORAL_TABLET | Freq: Every day | ORAL | Status: DC
  Administered 2015-03-04 – 2015-03-08 (×5): 1 via ORAL
  Filled 2015-03-03 (×6): qty 1

## 2015-03-03 MED ORDER — LEVETIRACETAM 750 MG PO TABS
750.0000 mg | ORAL_TABLET | Freq: Two times a day (BID) | ORAL | Status: DC
  Filled 2015-03-03 (×2): qty 1

## 2015-03-03 MED ORDER — ACETAMINOPHEN 325 MG PO TABS
650.0000 mg | ORAL_TABLET | Freq: Four times a day (QID) | ORAL | Status: DC | PRN
Start: 2015-03-03 — End: 2015-03-04
  Administered 2015-03-03: 650 mg via ORAL
  Filled 2015-03-03: qty 2

## 2015-03-03 MED ORDER — DIVALPROEX SODIUM 500 MG PO DR TAB
500.0000 mg | DELAYED_RELEASE_TABLET | Freq: Two times a day (BID) | ORAL | Status: DC
  Filled 2015-03-03 (×2): qty 1

## 2015-03-03 MED ORDER — LEVETIRACETAM 750 MG PO TABS: 750.0000 mg | ORAL_TABLET | Freq: Two times a day (BID) | ORAL | Status: DC

## 2015-03-03 MED ORDER — TRAMADOL HCL 50 MG PO TABS
50.0000 mg | ORAL_TABLET | Freq: Four times a day (QID) | ORAL | Status: DC | PRN
  Administered 2015-03-03 – 2015-03-04 (×4): 50 mg via ORAL
  Filled 2015-03-03 (×4): qty 1

## 2015-03-03 MED ORDER — DIVALPROEX SODIUM 500 MG PO DR TAB
500.0000 mg | DELAYED_RELEASE_TABLET | Freq: Two times a day (BID) | ORAL | Status: DC
  Administered 2015-03-03 – 2015-03-08 (×10): 500 mg via ORAL
  Filled 2015-03-03 (×11): qty 1

## 2015-03-03 MED ORDER — GADOBENATE DIMEGLUMINE 529 MG/ML IV SOLN
15.0000 mL | Freq: Once | INTRAVENOUS | Status: AC | PRN
  Administered 2015-03-03: 15 mL via INTRAVENOUS

## 2015-03-03 MED ORDER — LORAZEPAM 1 MG PO TABS
2.0000 mg | ORAL_TABLET | Freq: Four times a day (QID) | ORAL | Status: DC | PRN
Start: 2015-03-03 — End: 2015-03-08
  Administered 2015-03-03 – 2015-03-08 (×11): 2 mg via ORAL
  Filled 2015-03-03 (×11): qty 2

## 2015-03-03 MED ORDER — METRONIDAZOLE 500 MG PO TABS
500.0000 mg | ORAL_TABLET | Freq: Three times a day (TID) | ORAL | Status: DC
  Administered 2015-03-03 – 2015-03-08 (×15): 500 mg via ORAL
  Filled 2015-03-03 (×15): qty 1

## 2015-03-03 NOTE — Evaluation (Signed)
Clinical/Bedside Swallow Evaluation Patient Details  Name: Lindsey [name] MRN: [id number] Date of Birth: Mar 03, 1956  Today's Date: 03/03/2015 Time: SLP Start Time (ACUTE ONLY): 5701 SLP Stop Time (ACUTE ONLY): 0847 SLP Time Calculation (min) (ACUTE ONLY): 12 min  Past Medical History:  Past Medical History  Diagnosis Date  . Seizures   . Asthma    Past Surgical History:  Past Surgical History  Procedure Laterality Date  . Abdominal hysterectomy     HPI:  Lindsey [name] is a 59 y.o. BF PMHx Seizures and Polysubstance abuse, asthma, and Poor compliance with anti-seizure meds.    Assessment / Plan / Recommendation Clinical Impression  Pt demonstrates normal swallow function. No evidence of dysphagia or aspiration. No SLP f/u needed, will order regular texture diet with thin liquids.     Aspiration Risk       Diet Recommendation Age appropriate regular solids;Thin   Medication Administration: Whole meds with liquid    Other  Recommendations     Follow Up Recommendations       Frequency and Duration        Pertinent Vitals/Pain NA    SLP Swallow Goals     Swallow Study Prior Functional Status       General Other Pertinent Information: Lindsey [name] is a 59 y.o. BF PMHx Seizures and Polysubstance abuse, asthma, and Poor compliance with anti-seizure meds.  Type of Study: Bedside swallow evaluation Previous Swallow Assessment: none Diet Prior to this Study: NPO Temperature Spikes Noted: No Respiratory Status: Room air History of Recent Intubation: No Behavior/Cognition: Alert;Cooperative Oral Cavity - Dentition: Missing dentition Self-Feeding Abilities: Able to feed self Patient Positioning: Upright in bed Baseline Vocal Quality: Normal Volitional Cough: Strong Volitional Swallow: Able to elicit    Oral/Motor/Sensory Function Overall Oral Motor/Sensory Function: Appears within functional limits for tasks assessed   Ice Chips     Thin Liquid Thin Liquid:  Within functional limits Presentation: Straw;Self Fed;Cup    Nectar Thick Nectar Thick Liquid: Not tested   Honey Thick Honey Thick Liquid: Not tested   Puree Puree: Within functional limits Presentation: Self Fed   Solid   GO    Solid: Within functional limits      [facility], MA CCC-SLP [phone]  Lindsey [name], Katherene Ponto 03/03/2015,8:48 AM

## 2015-03-03 NOTE — Care Management Note (Signed)
Case Management Note  Patient Details  Name: Lindsey [name] MRN: [id number] Date of Birth: 1956/05/30  Subjective/Objective:                 Pt with hx of seizure disorder and substance abuse. From with home daughter readmitted with generalized seizures, last noted admission , 02/06/15 with similar problem. ? Noncompliance with medications.  Action/Plan: Return to home when medically stable. CM to f/u with d/c needs.  Expected Discharge Date:                  Expected Discharge Plan:  [place]  In-House Referral:     Discharge planning Services  CM Consult  Post Acute Care Choice:    Choice offered to:     DME Arranged:    DME Agency:     HH Arranged:    [place] Agency:     Status of Service:  In process, will continue to follow  Medicare Important Message Given:    Date Medicare IM Given:    Medicare IM give by:    Date Additional Medicare IM Given:    Additional Medicare Important Message give by:     If discussed at [place] of Stay Meetings, dates discussed:    Additional CommentsSharyn [name]( daughter), [phone]  Lindsey Mons, RN 03/03/2015, 10:22 AM

## 2015-03-03 NOTE — Progress Notes (Signed)
[facility] TEAM 1 - Stepdown/ICU TEAM Progress Note  Lindsey [name] GQQ:[id number] DOB: 03-14-1956 DOA: 03/01/2015 PCP: [name], MD  Admit HPI / Brief Narrative: 59 y.o. F Hx Seizures and Polysubstance abuse, asthma, and poor compliance with anti-seizure meds who presented to the ED with multiple episodes of generalized seizures. She was hospitalized on 5/13 with similar presentation. At that time depakote level was low. Discharged on depakote and keppra. Family indicated to Neurologist that she may not have been compliant with her meds again.    HPI/Subjective: The patient is complaining of exquisite right lower quadrant abdominal pain.  She reports this is a chronic issue that occurs intermittently.  She denies nausea or vomiting.  She denies melanoma or hematochezia.  She denies ongoing use of cocaine and states that she "smokes summary for that had mixed in".  She denies chest pain shortness of breath or headache at present.  Assessment/Plan:   Seizure  -Multifactorial to include noncompliance with seizure medication and substance abuse -Continue Keppra 750 mg BID -Continue valproate 500 mg BID  C-spine / head trauma -CT C-spine shows no acute traumatic fractures, DC c-collar  Encephalopathy -Post-ictal state - appears to have returned to her baseline mental status -Seizure precautions   Polysubstance abuse -UDS positive for marijuana and cocaine - patient admits to use of marijuana but denies chronic cocaine abuse - she has been counseled directly and clearly about the absolute need to avoid all substances of abuse and voices understanding of same -Negative for alcohol  Abdominal pain -Symptoms are not specific for a focused problem - physical exam reveals no findings other than pain with palpation -Given extent of pain and do feel a workup while hospitalized is appropriate - began with CT scan of abdomen   Code Status: FULL Family Communication: no family  present at time of exam Disposition Plan: Transfer to medical bed - clear liquid diet only until abdominal pain further evaluated - CT abdomen  Consultants: Neurology  Procedure/Significant Events: 6/4 CT head without contrast/C-spine without contrast;-No traumatic intracranial injury or fracture.-No  acute fracture or subluxation along C-spine  -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 -Mild grade 1 retrolisthesis of C4 on C5.  Antibiotics: NA  DVT prophylaxis: Subcutaneous heparin  Objective: Blood pressure 129/58, pulse 95, temperature 97.4 F (36.3 C), temperature source Oral, resp. rate 18, height 5\' 8"  (1.727 m), weight 61.1 kg (134 lb 11.2 oz), SpO2 100 %.  Intake/Output Summary (Last 24 hours) at 03/03/15 1032 Last data filed at 03/03/15 0600  Gross per 24 hour  Intake   1845 ml  Output    600 ml  Net   1245 ml   Exam: General: No acute respiratory distress Lungs: Clear to auscultation bilaterally without wheezes or crackles Cardiovascular: Regular rate and rhythm without murmur gallop or rub normal S1 and S2 Abdomen: Nondistended, soft, bowel sounds positive, no rebound, diffusely tender throughout entire right lower quadrant, no masses appreciable Extremities: No significant cyanosis, clubbing, or edema bilateral lower extremities Neurologic:  Alert and oriented 4, cranial nerves II through XII intact bilaterally, 5 over 5 strength bilateral upper and lower stream is  Data Reviewed: Basic Metabolic Panel:  Recent Labs Lab 03/01/15 1655  NA 140  K 4.1  CL 106  CO2 24  GLUCOSE 94  BUN 8  CREATININE 1.08*  CALCIUM 9.3   Liver Function Tests:  Recent Labs Lab 03/01/15  1655  AST 41  ALT 15  ALKPHOS 104  BILITOT 0.6  PROT 7.4  ALBUMIN 4.0   CBC:  Recent Labs Lab 03/01/15 1655  WBC 8.9  NEUTROABS 7.5  HGB 13.3  HCT  39.4  MCV 89.5  PLT 389    Studies:  Recent x-ray studies have been reviewed in detail by the Attending Physician  Scheduled Meds:  Scheduled Meds: . diclofenac sodium  4 g Topical QID  . feeding supplement (ENSURE ENLIVE)  237 mL Oral BID BM  . folic acid  1 mg Oral Daily  . folic acid  1 mg Oral Daily  . heparin  5,000 Units Subcutaneous 3 times per day  . levETIRAcetam  750 mg Intravenous Q12H  . magnesium oxide  400 mg Oral BID  . multivitamin with minerals  1 tablet Oral Daily  . sodium chloride  3 mL Intravenous Q12H  . thiamine  100 mg Oral Daily   Or  . thiamine  100 mg Intravenous Daily  . valproate sodium  500 mg Intravenous Q12H    Time spent on care of this patient: 35 mins  Cherene Altes, MD [facility] For Consults/Admissions - Flow Manager - [phone] Office  [phone]  Contact MD directly via text page:      [email or web address]      password [email or web address]  03/03/2015, 10:32 AM   LOS: 1 day

## 2015-03-03 NOTE — Progress Notes (Signed)
Pt arrived to 4N04 via wheelchair, ambulated to bed.  Pt welcomed and settled into the room.  Pt a/o x4.  Vital signs taken.  Call bell within reach.  Will continue to monitor. Cori Razor, RN

## 2015-03-03 NOTE — Progress Notes (Signed)
Pt had 3rd loose stool in 24 hours. Placed on enteric precautions. Asked pt if she was having diarrhea PTA and she reported several loose BMs for 2-3 days while at home. Will send sample and continue to monitor.

## 2015-03-03 NOTE — Progress Notes (Signed)
Called report to Hin, RN on 4N. Pt's VSS, all personal belongings with pt. CCMD and Elink notified, monitor removed.

## 2015-03-04 ENCOUNTER — Encounter (HOSPITAL_COMMUNITY): Payer: Self-pay | Admitting: Radiology

## 2015-03-04 DIAGNOSIS — N2889 Other specified disorders of kidney and ureter: Secondary | ICD-10-CM

## 2015-03-04 DIAGNOSIS — D62 Acute posthemorrhagic anemia: Secondary | ICD-10-CM

## 2015-03-04 DIAGNOSIS — S37019A Minor contusion of unspecified kidney, initial encounter: Secondary | ICD-10-CM | POA: Insufficient documentation

## 2015-03-04 DIAGNOSIS — R569 Unspecified convulsions: Secondary | ICD-10-CM

## 2015-03-04 LAB — CBC
HCT: 18.8 % — ABNORMAL LOW (ref 36.0–46.0)
HCT: 20.6 % — ABNORMAL LOW (ref 36.0–46.0)
HEMATOCRIT: 26.1 % — AB (ref 36.0–46.0)
HEMOGLOBIN: 7 g/dL — AB (ref 12.0–15.0)
Hemoglobin: 6.4 g/dL — CL (ref 12.0–15.0)
Hemoglobin: 9.1 g/dL — ABNORMAL LOW (ref 12.0–15.0)
MCH: 30 pg (ref 26.0–34.0)
MCH: 30.4 pg (ref 26.0–34.0)
MCH: 29.8 pg (ref 26.0–34.0)
MCHC: 34 g/dL (ref 30.0–36.0)
MCHC: 34 g/dL (ref 30.0–36.0)
MCHC: 34.9 g/dL (ref 30.0–36.0)
MCV: 88.3 fL (ref 78.0–100.0)
MCV: 89.6 fL (ref 78.0–100.0)
MCV: 85.6 fL (ref 78.0–100.0)
PLATELETS: 212 10*3/uL (ref 150–400)
PLATELETS: 257 10*3/uL (ref 150–400)
PLATELETS: 202 10*3/uL (ref 150–400)
RBC: 2.13 MIL/uL — ABNORMAL LOW (ref 3.87–5.11)
RBC: 2.3 MIL/uL — ABNORMAL LOW (ref 3.87–5.11)
RBC: 3.05 MIL/uL — ABNORMAL LOW (ref 3.87–5.11)
RDW: 14.1 % (ref 11.5–15.5)
RDW: 14.3 % (ref 11.5–15.5)
RDW: 14.7 % (ref 11.5–15.5)
WBC: 9.1 10*3/uL (ref 4.0–10.5)
WBC: 9.3 10*3/uL (ref 4.0–10.5)
WBC: 8.1 10*3/uL (ref 4.0–10.5)

## 2015-03-04 LAB — COMPREHENSIVE METABOLIC PANEL
ALT: 11 U/L — ABNORMAL LOW (ref 14–54)
AST: 18 U/L (ref 15–41)
Albumin: 3 g/dL — ABNORMAL LOW (ref 3.5–5.0)
Alkaline Phosphatase: 55 U/L (ref 38–126)
Anion gap: 7 (ref 5–15)
BUN: 9 mg/dL (ref 6–20)
CALCIUM: 8.9 mg/dL (ref 8.9–10.3)
CO2: 26 mmol/L (ref 22–32)
Chloride: 101 mmol/L (ref 101–111)
Creatinine, Ser: 0.72 mg/dL (ref 0.44–1.00)
GFR calc Af Amer: >60 mL/min (ref >60)
GFR calc non Af Amer: >60 mL/min (ref >60)
Glucose, Bld: 96 mg/dL (ref 65–99)
Potassium: 3.5 mmol/L (ref 3.5–5.1)
SODIUM: 134 mmol/L — AB (ref 135–145)
Total Bilirubin: 0.6 mg/dL (ref 0.3–1.2)
Total Protein: 5.8 g/dL — ABNORMAL LOW (ref 6.5–8.1)

## 2015-03-04 LAB — ABO/RH: ABO/RH(D): O POS

## 2015-03-04 LAB — APTT: aPTT: 34 seconds (ref 24–37)

## 2015-03-04 LAB — PROTIME-INR
INR: 1.03 (ref 0.00–1.49)
Prothrombin Time: 13.7 seconds (ref 11.6–15.2)

## 2015-03-04 LAB — PREPARE RBC (CROSSMATCH)

## 2015-03-04 MED ORDER — SODIUM CHLORIDE 0.9 % IV SOLN
Freq: Once | INTRAVENOUS | Status: AC
  Administered 2015-03-04: 14:00:00 via INTRAVENOUS

## 2015-03-04 MED ORDER — SODIUM CHLORIDE 0.9 % IJ SOLN
10.0000 mL | INTRAMUSCULAR | Status: DC | PRN
  Administered 2015-03-04 – 2015-03-07 (×4): 10 mL
  Administered 2015-03-08: 20 mL
  Filled 2015-03-04 (×5): qty 40

## 2015-03-04 MED ORDER — ACETAMINOPHEN 325 MG PO TABS
650.0000 mg | ORAL_TABLET | Freq: Four times a day (QID) | ORAL | Status: DC | PRN
  Administered 2015-03-06: 650 mg via ORAL
  Filled 2015-03-04: qty 2

## 2015-03-04 MED ORDER — TRAMADOL HCL 50 MG PO TABS
50.0000 mg | ORAL_TABLET | Freq: Four times a day (QID) | ORAL | Status: DC | PRN
  Administered 2015-03-04 – 2015-03-08 (×9): 50 mg via ORAL
  Filled 2015-03-04 (×9): qty 1

## 2015-03-04 MED ORDER — LEVETIRACETAM 750 MG PO TABS
750.0000 mg | ORAL_TABLET | Freq: Two times a day (BID) | ORAL | Status: DC
  Administered 2015-03-05 – 2015-03-08 (×7): 750 mg via ORAL
  Filled 2015-03-04 (×8): qty 1

## 2015-03-04 NOTE — Progress Notes (Addendum)
Lindsey [name] TEAM 1 - Stepdown/ICU TEAM Progress Note  Lindsey [name] WIO:[id number] DOB: 08-06-1956 DOA: 03/01/2015 PCP: [name], MD  Admit HPI / Brief Narrative: 59 y.o. F Hx Seizures and Polysubstance abuse, asthma, and poor compliance with anti-seizure meds who presented to the ED with multiple episodes of generalized seizures. She was hospitalized on 5/13 with similar presentation. At that time depakote level was low. Discharged on depakote and keppra. Family indicated to Neurologist that she may not have been compliant with her meds again.    HPI/Subjective: Complaints of constant right flank pain rated at 8-9/10 in severity, nonradiating, worse with movements. She however stated that she slept well overnight until this morning. Denies overt bleeding. No dizziness, lightheadedness, chest pain or dyspnea. Having diarrhea and cannot tell if it's any better than yesterday.  Assessment/Plan:   Seizure  -Multifactorial to include noncompliance with seizure medication and substance abuse -Continue Keppra 750 mg BID-had been inadvertently discontinued 6/6-resumed 6/7 -Continue valproate 500 mg BID - No further seizures reported.  Right perinephric hematoma - Evaluation of right flank pain since 6/6 has revealed large right perinephric hematoma on CT and then MRI of abdomen. - This may be secondary to fall with seizure at home on 6/4. However she may have underlying lesion and will need further evaluation for same. - Urology consulted and discussed with Dr. Louis Meckel: Recommended conservative treatment initially including bedrest, Foley catheter, nothing by mouth status in case intervention is warranted, monitor hemoglobin closely, transfuse as needed. If she decompensates or hemoglobin does not remain stable, may need embolization by IR-consulted. - As per IR, if patient able to discharge without procedure, would need to follow up in IR clinic with Dr. Earleen Newport 2-4 weeks post discharge  for discussion of renal arteriogram evaluation for source of bleed and possible treatments. - Pain control. - Remains hemodynamically stable at this time.  Acute posthemorrhagic anemia - Secondary to right perinephric hematoma - Transfusing 2 units of PRBCs and follow CBCs closely. Transfuse if hemoglobin less than 8 g per DL.  C. difficile colitis - Continue metronidazole and complete 14 days' treatment  C-spine / head trauma -CT C-spine shows no acute traumatic fractures, DC c-collar  Encephalopathy -Post-ictal state - appears to have returned to her baseline mental status -Seizure precautions   Polysubstance abuse -UDS positive for marijuana and cocaine - patient admits to use of marijuana but denies chronic cocaine abuse - she has been counseled directly and clearly about the absolute need to avoid all substances of abuse and voices understanding of same -Negative for alcohol     Code Status: FULL Family Communication: no family present at time of exam Disposition Plan: DC home when medically stable.  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 -Mild grade 1 retrolisthesis of C4 on C5.  03/03/2015 CT abdomen and pelvis with contrast: IMPRESSION: 1. 8 x 9.8 x 13.4 cm right perinephric hematoma which appears to arise from the lateral interpolar aspect of the right kidney suggesting an underlying mass such as an angiomyolipoma or renal cell carcinoma versus renal trauma/laceration. Small amount of hemorrhage extending inferiorly along the right psoas muscle. Further evaluation with a MRI of the abdomen is  recommended for evaluation of the kidney.  2. Small right  pleural effusion.  03/03/15: MRI of the abdomen with and without contrast IMPRESSION: 1. Stable appearance of a large hematoma extending from the right mid kidney into the perinephric space. This could have a subcapsular component. The possibility of a renal cyst with internal bleeding and subsequent expansion into the perirenal space is raised as a possibility, given that an underlying mass lesion is not readily apparent. Our sensitivity for smaller mass lesions is reduced due to the severity of motion artifact. There are several adjacent cysts, in the right kidney upper pole and in the right mid to lower kidney, which are Bosniak category 1. A part of the hematoma does cause concavity along the adjacent renal margin, but the kidney is not flattened in the typical fashion as if the entire hematoma was subcapsular, and much of the hematoma is in the perinephric space. 2. Edema in the perinephric space and retroperitoneum, flattening of the posterior margin of the right hepatic lobe, and right pleural effusion may all be reactive. 3. Small left perirenal fluid collection just below the left renal vein, probably representing a dilated lymphatic structure  Antibiotics: NA  DVT prophylaxis: SCDs. Discontinued subcutaneous heparin secondary to hemorrhage.  Objective: Filed Vitals:   03/04/15 1515 03/04/15 1530 03/04/15 1635 03/04/15 1700  BP: 152/53 102/71 158/65 151/71  Pulse: 111 106 93 92  Temp: 98 F (36.7 C) 98.2 F (36.8 C) 98.1 F (36.7 C) 98.6 F (37 C)  TempSrc: Oral Oral Oral Oral  Resp: 16 16 16 14   Height:      Weight:      SpO2: 100% 100% 100% 100%      Intake/Output Summary (Last 24 hours) at 03/04/15 1657 Last data filed at 03/04/15 1635  Gross per 24 hour  Intake    355 ml  Output      0 ml  Net    355 ml   Exam: General: No acute respiratory distress Lungs: Clear to auscultation  bilaterally without wheezes or crackles Cardiovascular: Regular rate and rhythm without murmur gallop or rub normal S1 and S2 Abdomen: Nondistended, soft, bowel sounds positive, no rebound, diffusely tender throughout entire right lower quadrant, no masses appreciable. No peritoneal signs. No external evidence of bleeding/ecchymosis or trauma. Patient has scars of previous skin graft in right lower extremity and possibly right lower abdomen. Patient was examined with a female nurse/charge nurse in the room. Extremities: No significant cyanosis, clubbing, or edema bilateral lower extremities Neurologic:  Alert and oriented 4, cranial nerves II through XII intact bilaterally, 5 over 5 strength bilateral upper and lower stream is  Data Reviewed: Basic Metabolic Panel:  Recent Labs Lab 03/01/15 1655 03/04/15 0739  NA 140 134*  K 4.1 3.5  CL 106 101  CO2 24 26  GLUCOSE 94 96  BUN 8 9  CREATININE 1.08* 0.72  CALCIUM 9.3 8.9   Liver Function Tests:  Recent Labs Lab 03/01/15 1655 03/04/15 0739  AST 41 18  ALT 15 11*  ALKPHOS 104 55  BILITOT 0.6 0.6  PROT 7.4 5.8*  ALBUMIN 4.0 3.0*   CBC:  Recent Labs Lab 03/01/15 1655 03/04/15 0945 03/04/15 1150  WBC 8.9 9.1 9.3  NEUTROABS 7.5  --   --   HGB 13.3 6.4* 7.0*  HCT 39.4 18.8* 20.6*  MCV 89.5 88.3 89.6  PLT 389 212 257    Studies:  Recent x-ray studies have been reviewed in detail by the Attending Physician  Scheduled Meds:  Scheduled Meds: . diclofenac sodium  4 g Topical QID  . divalproex  500 mg Oral Q12H  . feeding supplement (ENSURE ENLIVE)  237 mL Oral BID BM  . folic acid  1 mg Oral Daily  . magnesium oxide  400 mg Oral BID  . metroNIDAZOLE  500 mg Oral 3 times per day  . multivitamin with minerals  1 tablet Oral Daily  . sodium chloride  3 mL Intravenous Q12H  . thiamine  100 mg Oral Daily    Time spent on care of this patient: 65 mins  Oliana Gowens, MD, FACP, FHM. [facility] Pager  [phone]  If 7PM-7AM, please contact night-coverage [email or web address] Password [email or web address] 03/04/2015, 5:10 PM   LOS: 2 days

## 2015-03-04 NOTE — Consult Note (Signed)
I have been asked to see the patient by Dr. Vernell Leep, for evaluation and management of right retroperitoneal hemorrhage.  History of present illness: 59 year old female who presented to the emergency department having suffered a large seizure. The patient states that this was after smoking a laced marijuana joint- presumably cocaine.Marland Kitchen She was subsequently admitted to the hospital and treated for her seizures with Depakote. She then began complaining of exquisite abdominal tenderness. CT scan was subsequently obtained demonstrating a large right perinephric hematoma. This was followed up with an MRI demonstrating a expanding right retroperitoneal hemorrhage. There is no apparent or obvious mass underlying the hemorrhagic region. Currently, the patient is complaining of right lower quadrant pain. She denies any voiding symptoms. She's not had any gross hematuria. She denies any nausea or vomiting. There is no family history of tuberous sclerosis. The patient has had numerous CT scans, none demonstrating tubers.  Review of systems: A 12 point comprehensive review of systems was obtained and is negative unless otherwise stated in the history of present illness.  Patient Active Problem List   Diagnosis Date Noted  . Perinephric hematoma   . Head trauma   . Post-ictal state 03/01/2015  . Seizure secondary to subtherapeutic anticonvulsant medication 02/06/2015  . Recurrent seizures 02/06/2015  . Acute encephalopathy   . Polysubstance abuse   . Alcohol dependence with withdrawal with complication 10/62/6948  . Protein-calorie malnutrition, severe 03/04/2014  . Encephalopathy 03/04/2014  . Fever 03/04/2014  . Alcohol abuse 11/06/2013  . Alcohol withdrawal 11/06/2013  . Marijuana abuse 11/06/2013  . Seizure 11/05/2013  . Altered mental status 11/05/2013    No current facility-administered medications on file prior to encounter.   Current Outpatient Prescriptions on File Prior to Encounter   Medication Sig Dispense Refill  . acetaminophen (TYLENOL) 500 MG tablet Take 1,000 mg by mouth every 6 (six) hours as needed for moderate pain.     Marland Kitchen diclofenac sodium (VOLTAREN) 1 % GEL Apply 4 g topically 4 (four) times daily.    . divalproex (DEPAKOTE) 500 MG DR tablet Take 1 tablet (500 mg total) by mouth every 12 (twelve) hours. 60 tablet 0  . feeding supplement, ENSURE COMPLETE, (ENSURE COMPLETE) LIQD Take 237 mLs by mouth 2 (two) times daily between meals. 60 Bottle 0  . folic acid (FOLVITE) 1 MG tablet Take 1 tablet (1 mg total) by mouth daily. 30 tablet 0  . levETIRAcetam (KEPPRA) 750 MG tablet Take 1 tablet (750 mg total) by mouth 2 (two) times daily. 60 tablet 0  . albuterol (PROVENTIL HFA;VENTOLIN HFA) 108 (90 BASE) MCG/ACT inhaler Inhale 2 puffs into the lungs every 6 (six) hours as needed for wheezing or shortness of breath. 18 g 0  . LORazepam (ATIVAN) 2 MG tablet Take 2 mg by mouth every 6 (six) hours as needed for anxiety.    . magnesium oxide (MAG-OX) 400 (241.3 MG) MG tablet Take 1 tablet (400 mg total) by mouth 2 (two) times daily. 6 tablet 0    Past Medical History  Diagnosis Date  . Seizures   . Asthma     Past Surgical History  Procedure Laterality Date  . Abdominal hysterectomy      History  Substance Use Topics  . Smoking status: Never Smoker   . Smokeless tobacco: Never Used  . Alcohol Use: Yes     Comment: drinks heavily. Agrees with drinking every other day.     History reviewed. No pertinent family history.  PE: Filed Vitals:  03/04/15 1530 03/04/15 1635 03/04/15 1700 03/04/15 1820  BP: 102/71 158/65 151/71 171/69  Pulse: 106 93 92 90  Temp: 98.2 F (36.8 C) 98.1 F (36.7 C) 98.6 F (37 C) 97.9 F (36.6 C)  TempSrc: Oral Oral Oral Oral  Resp: 16 16 14 16   Height:      Weight:      SpO2: 100% 100% 100% 100%   Patient appears to be in no acute distress  patient is alert and oriented x3 Atraumatic normocephalic head No cervical or  supraclavicular lymphadenopathy appreciated No increased work of breathing, no audible wheezes/rhonchi Regular sinus rhythm/rate Abdomen is soft, patient has right lower quadrant tenderness to palpation. In addition, she has right flank tenderness. There is no left-sided tenderness. The patient's Foley catheter is draining clear yellow urine. There is no evidence of hematuria. Lower extremities are symmetric without appreciable edema Grossly neurologically intact No identifiable skin lesions   Recent Labs  03/04/15 0945 03/04/15 1150 03/04/15 1945  WBC 9.1 9.3 8.1  HGB 6.4* 7.0* 9.1*  HCT 18.8* 20.6* 26.1*    Recent Labs  03/04/15 0739  NA 134*  K 3.5  CL 101  CO2 26  GLUCOSE 96  BUN 9  CREATININE 0.72  CALCIUM 8.9    Recent Labs  03/04/15 1150  INR 1.03   No results for input(s): LABURIN in the last 72 hours. Results for orders placed or performed during the hospital encounter of 03/01/15  Clostridium Difficile by PCR (not at [facility])     Status: Abnormal   Collection Time: 03/03/15  1:12 PM  Result Value Ref Range Status   C difficile by pcr POSITIVE (A) NEGATIVE Final    Comment: CRITICAL RESULT CALLED TO, READ BACK BY AND VERIFIED WITH: [facility] RN 15:40 03/03/15 ([email or web address])     Imaging: I've independently reviewed the patient's CT scan and MRI demonstrating a large right-sided retroperitoneal hemorrhage which appears to be emanating from the kidney. There is no underlying or obvious tumor. However, given the extent of bleeding, underlying malignancy cannot be ruled out.  Imp: The patient has a large right-sided retroperitoneal hemorrhage. She has developed acute blood loss anemia and required to units of packed red blood cells earlier today. Her repeat hemoglobin demonstrates an appropriate response to her 2 units. Hopefully, she has stopped bleeding at this point. The etiology of her hemorrhages not clear to me, however it may have been triggered by her cocaine use or  possibly by her fall associated with her seizure. She is currently stable despite her large volume blood loss.  Recommendations: Would continue to keep the patient on bedrest until her hemoglobin has proven to be stable. She should have every 6 hemoglobin levels. I also recommended that interventional radiology be consult at sooner rather than later in the event that they need to embolize the bleeding vessel within the patient's kidney. However, I think at this point she stable and this is unlikely. Given that she has no gross hematuria, her Foley catheter can be removed at this point.  As long as her hemoglobin remains stable over 24 hours the patient can be taken off of bedrest and discharged home. She will need follow-up with me in 3 months with a CT scan prior to ensure that there is no underlying mass or lesion contributing to her hemorrhage. I worked to get her scheduled for follow-up.  Thank you for involving me in this patient's care, please page with any further questions or concerns. Jaheim Canino  W

## 2015-03-04 NOTE — Progress Notes (Signed)
Blood started at 13:30, pt complaining of pain at IV site and site began leaking so blood was stopped at 13:43. IV team at bedside to insert midline IV. Blood tubing changed and resumed at 15:15 to midline access. Pt with no complaints.

## 2015-03-04 NOTE — Progress Notes (Signed)
2nd unit PRBCs completed at 1814. STAT CBC order and IV team consult placed to draw labs.

## 2015-03-04 NOTE — Progress Notes (Signed)
Peripherally Inserted Central Catheter/Midline Placement  The IV Nurse has discussed with the patient and/or persons authorized to consent for the patient, the purpose of this procedure and the potential benefits and risks involved with this procedure.  The benefits include less needle sticks, lab draws from the catheter and patient may be discharged home with the catheter.  Risks include, but not limited to, infection, bleeding, blood clot (thrombus formation), and puncture of an artery; nerve damage and irregular heat beat.  Alternatives to this procedure were also discussed.  PICC/Midline Placement Documentation        Henderson Baltimore 03/04/2015, 2:37 PM Consent obtained by Claretha Cooper, RN

## 2015-03-04 NOTE — Progress Notes (Signed)
I was consult by Dr. Algis Liming for help in management of this patient's right retroperitoneal hemorrhage.  I reviewed the patient's chart as well as her recent imaging.  She is likely bleeding from her kidney, it is unclear if she has an underlying mass.  There are several cysts reported on a ultrasound performed in 2007.  She has no abdominal imaging to compare to.  Regardless, the patient's hemoglobin is currently 6.  As such, I recommended that the patient be made nothing by mouth, placed on bedrest and a catheter placed, transfused, trend her hemoglobin, and consult IR.  I don't think the patient needs to be urgently embolized as she is otherwise stable.  However, she may need to be embolized in the near future and I think it is better to give interventional radiology a heads up just in case.  I will follow with a formal consult note soon.

## 2015-03-04 NOTE — Consult Note (Signed)
Chief Complaint: Chief Complaint  Patient presents with  . Seizures  . Fall  R perinephric hematoma  Referring Physician(s): Dr Louis Meckel  History of Present Illness: Lindsey [name] is a 59 y.o. female   Pt with known seizure disorder Non compliance with meds Known polysubstance abuse Pt with grand mal sz 6/4 at home and fell while in restroom Hit commode then floor To ED for evaluation Initially worked up for sz disorder and non compliance Developed abd pain; Rt flank pain CT 6/6 revealed large Rt perinephric bleed; without evidence of extravasation/active bleed Hg drop from 13.3 to 6.4 now 7.0 BP stable at 120/68 HR 87 Pt has been seen and examined by Dr Algis Liming and Dr Louis Meckel - Urology He states plan:  to monitor vitals and hg If change in hemodynamics---will call IR for possible renal arteriogram with embolization Dr Earleen Newport has reviewed chart and imaging and approves procedure/plan I have seen and examined pt   Past Medical History  Diagnosis Date  . Seizures   . Asthma     Past Surgical History  Procedure Laterality Date  . Abdominal hysterectomy      Allergies: Morphine and related and Penicillins  Medications: Prior to Admission medications   Medication Sig Start Date End Date Taking? Authorizing Provider  acetaminophen (TYLENOL) 500 MG tablet Take 1,000 mg by mouth every 6 (six) hours as needed for moderate pain.    Yes Historical Provider, MD  diclofenac sodium (VOLTAREN) 1 % GEL Apply 4 g topically 4 (four) times daily.   Yes Historical Provider, MD  divalproex (DEPAKOTE) 500 MG DR tablet Take 1 tablet (500 mg total) by mouth every 12 (twelve) hours. 02/07/15  Yes Belkys A Regalado, MD  feeding supplement, ENSURE COMPLETE, (ENSURE COMPLETE) LIQD Take 237 mLs by mouth 2 (two) times daily between meals. 01/19/15  Yes Shanker Kristeen Mans, MD  folic acid (FOLVITE) 1 MG tablet Take 1 tablet (1 mg total) by mouth daily. 01/19/15  Yes Shanker Kristeen Mans, MD    levETIRAcetam (KEPPRA) 750 MG tablet Take 1 tablet (750 mg total) by mouth 2 (two) times daily. 02/07/15  Yes Belkys A Regalado, MD  Multiple Vitamin (MULTIVITAMIN WITH MINERALS) TABS tablet Take 1 tablet by mouth daily.   Yes Historical Provider, MD  albuterol (PROVENTIL HFA;VENTOLIN HFA) 108 (90 BASE) MCG/ACT inhaler Inhale 2 puffs into the lungs every 6 (six) hours as needed for wheezing or shortness of breath. 01/19/15   Shanker Kristeen Mans, MD  LORazepam (ATIVAN) 2 MG tablet Take 2 mg by mouth every 6 (six) hours as needed for anxiety.    Historical Provider, MD  magnesium oxide (MAG-OX) 400 (241.3 MG) MG tablet Take 1 tablet (400 mg total) by mouth 2 (two) times daily. 02/07/15   Elmarie Shiley, MD     History reviewed. No pertinent family history.  History   Social History  . Marital Status: Legally Separated    Spouse Name: N/A  . Number of Children: N/A  . Years of Education: N/A   Social History Main Topics  . Smoking status: Never Smoker   . Smokeless tobacco: Never Used  . Alcohol Use: Yes     Comment: drinks heavily. Agrees with drinking every other day.   . Drug Use: Yes    Special: Marijuana     Comment: "every now and then"  . Sexual Activity: Not on file   Other Topics Concern  . None   Social History Narrative  Review of Systems: A 12 point ROS discussed and pertinent positives are indicated in the HPI above.  All other systems are negative.  Review of Systems  Constitutional: Positive for activity change, appetite change and fatigue. Negative for fever.  Respiratory: Positive for shortness of breath. Negative for cough.   Gastrointestinal: Positive for nausea and abdominal pain.  Musculoskeletal: Positive for back pain.  Neurological: Positive for seizures and weakness. Negative for dizziness.  Psychiatric/Behavioral: Negative for behavioral problems and confusion.    Vital Signs: BP 140/63 mmHg  Pulse 90  Temp(Src) 97.9 F (36.6 C) (Oral)  Resp  16  Ht 5\' 8"  (1.727 m)  Wt 61.1 kg (134 lb 11.2 oz)  BMI 20.49 kg/m2  SpO2 100%  Physical Exam  Constitutional: She is oriented to person, place, and time. She appears well-nourished.  Cardiovascular: Normal rate and regular rhythm.   No murmur heard. Pulmonary/Chest: Effort normal and breath sounds normal. No respiratory distress. She has no wheezes.  Abdominal: Soft. Bowel sounds are normal. There is tenderness.  Musculoskeletal: Normal range of motion.  Neurological: She is alert and oriented to person, place, and time.  Skin: Skin is warm and dry.  Psychiatric: She has a normal mood and affect. Her behavior is normal. Judgment and thought content normal.  Nursing note and vitals reviewed.   Mallampati Score:  MD Evaluation Airway: WNL Heart: WNL Abdomen: WNL Chest/ Lungs: WNL ASA  Classification: 3 Mallampati/Airway Score: One  Imaging: Dg Chest 1 View  03/01/2015   CLINICAL DATA:  Seizures.  Fall in bathroom.  EXAM: CHEST  1 VIEW  COMPARISON:  02/06/2015  FINDINGS: Normal cardiac silhouette. No effusion, infiltrate, or pneumothorax. No acute osseous abnormality.  IMPRESSION: No acute cardiopulmonary process.   Electronically Signed   By: Suzy Bouchard M.D.   On: 03/01/2015 18:19   Ct Head Wo Contrast  03/01/2015   CLINICAL DATA:  Acute onset seizures. Status post fall in bathroom. Hematoma over the left orbit. Concern for cervical spine injury. Initial encounter.  EXAM: CT HEAD WITHOUT CONTRAST  CT CERVICAL SPINE WITHOUT CONTRAST  TECHNIQUE: Multidetector CT imaging of the head and cervical spine was performed following the standard protocol without intravenous contrast. Multiplanar CT image reconstructions of the cervical spine were also generated.  COMPARISON:  CT of the head performed 02/06/2015, and CT of the cervical spine performed 12/29/2005  FINDINGS: CT HEAD FINDINGS  There is no evidence of acute infarction, mass lesion, or intra- or extra-axial hemorrhage on CT.   Prominence of the ventricles and sulci reflects mild cortical volume loss. Mild cerebellar atrophy is noted.  The brainstem and fourth ventricle are within normal limits. The basal ganglia are unremarkable in appearance. The cerebral hemispheres demonstrate grossly normal gray-white differentiation. No mass effect or midline shift is seen.  There is no evidence of fracture; visualized osseous structures are unremarkable in appearance. The visualized portions of the orbits are within normal limits. The paranasal sinuses and mastoid air cells are well-aerated. Mild soft tissue swelling is noted lateral to the left orbit.  CT CERVICAL SPINE FINDINGS  There is no evidence of acute fracture or subluxation. Vertebral bodies demonstrate normal height. Chronic deformity at the right-sided facet at C5-C6 likely reflects remote traumatic injury. An associated osseous fragment extends partially into the spinal canal. This appearance is stable from 2007. There is also mild grade 1 retrolisthesis of C4 on C5. An accessory osseous fragment is noted superior to the dens, apparently new from 2007 and likely  degenerative in nature. Intervertebral disc spaces are preserved. Prevertebral soft tissues are within normal limits.  A 0.9 cm hypodensity within the right thyroid lobe is likely benign, given its size. Mild emphysematous change is noted at the lung apices. No significant soft tissue abnormalities are seen.  IMPRESSION: 1. No evidence of traumatic intracranial injury or fracture. 2. No evidence of acute fracture or subluxation along the cervical spine. 3. Mild soft tissue swelling noted lateral to the left orbit. 4. Chronic deformity of the right-sided facet at C5-C6 likely reflects remote traumatic injury. 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 Mild grade 1 retrolisthesis of C4 on C5. 5. Mild cortical volume loss noted.   Electronically Signed   By: Garald Balding M.D.   On:  03/01/2015 19:41   Ct Head Wo Contrast  02/06/2015   CLINICAL DATA:  Initial evaluation for acute seizure, possible head injury.  EXAM: CT HEAD WITHOUT CONTRAST  CT MAXILLOFACIAL WITHOUT CONTRAST  TECHNIQUE: Multidetector CT imaging of the head and maxillofacial structures were performed using the standard protocol without intravenous contrast. Multiplanar CT image reconstructions of the maxillofacial structures were also generated.  COMPARISON:  Prior study from 01/18/2015  FINDINGS: CT HEAD FINDINGS  Atrophy with mild chronic microvascular ischemic changes present.  There is no acute intracranial hemorrhage or infarct. No mass lesion or midline shift. Gray-white matter differentiation is well maintained. Ventricles are normal in size without evidence of hydrocephalus. CSF containing spaces are within normal limits. No extra-axial fluid collection.  The calvarium is intact.  Orbital soft tissues are within normal limits.  The mastoid air cells are clear.  Scalp soft tissues are unremarkable.  CT MAXILLOFACIAL FINDINGS  No significant soft tissue swelling present within the face. Globes are intact. No retro-orbital pathology. Bony orbits are intact without evidence of orbital floor fracture.  Zygomatic arches are intact. Pterygoid plates are intact. Maxilla intact. No nasal bone fracture. Nasal septum midline.  Mandible intact. Mandibular condyles normally situated within the temporomandibular fossa.  Mild opacity present within the left sphenoid sinus. Paranasal sinuses are otherwise clear.  IMPRESSION: CT HEAD:  No acute intracranial process.  CT MAXILLOFACIAL:  No acute maxillofacial injury.   Electronically Signed   By: Jeannine Boga M.D.   On: 02/06/2015 06:26   Ct Cervical Spine Wo Contrast  03/01/2015   CLINICAL DATA:  Acute onset seizures. Status post fall in bathroom. Hematoma over the left orbit. Concern for cervical spine injury. Initial encounter.  EXAM: CT HEAD WITHOUT CONTRAST  CT CERVICAL  SPINE WITHOUT CONTRAST  TECHNIQUE: Multidetector CT imaging of the head and cervical spine was performed following the standard protocol without intravenous contrast. Multiplanar CT image reconstructions of the cervical spine were also generated.  COMPARISON:  CT of the head performed 02/06/2015, and CT of the cervical spine performed 12/29/2005  FINDINGS: CT HEAD FINDINGS  There is no evidence of acute infarction, mass lesion, or intra- or extra-axial hemorrhage on CT.  Prominence of the ventricles and sulci reflects mild cortical volume loss. Mild cerebellar atrophy is noted.  The brainstem and fourth ventricle are within normal limits. The basal ganglia are unremarkable in appearance. The cerebral hemispheres demonstrate grossly normal gray-white differentiation. No mass effect or midline shift is seen.  There is no evidence of fracture; visualized osseous structures are unremarkable in appearance. The visualized portions of the orbits are within normal limits. The paranasal sinuses and mastoid air cells are well-aerated. Mild soft tissue swelling is  noted lateral to the left orbit.  CT CERVICAL SPINE FINDINGS  There is no evidence of acute fracture or subluxation. Vertebral bodies demonstrate normal height. Chronic deformity at the right-sided facet at C5-C6 likely reflects remote traumatic injury. An associated osseous fragment extends partially into the spinal canal. This appearance is stable from 2007. There is also mild grade 1 retrolisthesis of C4 on C5. An accessory osseous fragment is noted superior to the dens, apparently new from 2007 and likely degenerative in nature. Intervertebral disc spaces are preserved. Prevertebral soft tissues are within normal limits.  A 0.9 cm hypodensity within the right thyroid lobe is likely benign, given its size. Mild emphysematous change is noted at the lung apices. No significant soft tissue abnormalities are seen.  IMPRESSION: 1. No evidence of traumatic intracranial  injury or fracture. 2. No evidence of acute fracture or subluxation along the cervical spine. 3. Mild soft tissue swelling noted lateral to the left orbit. 4. Chronic deformity of the right-sided facet at C5-C6 likely reflects remote traumatic injury. 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 Mild grade 1 retrolisthesis of C4 on C5. 5. Mild cortical volume loss noted.   Electronically Signed   By: Garald Balding M.D.   On: 03/01/2015 19:41   Mr Abdomen W Wo Contrast  03/04/2015   CLINICAL DATA:  Right lower quadrant abdominal pain. Perinephric hematoma. Question underlying mass.  EXAM: MRI ABDOMEN WITHOUT AND WITH CONTRAST  TECHNIQUE: Multiplanar multisequence MR imaging of the abdomen was performed both before and after the administration of intravenous contrast.  CONTRAST:  79mL MULTIHANCE GADOBENATE DIMEGLUMINE 529 MG/ML IV SOLN  COMPARISON:  03/03/2015  FINDINGS: Despite efforts by the technologist and patient, motion artifact is present on today's exam and could not be eliminated. This reduces exam sensitivity and specificity.  Lower chest:  Small right pleural effusion with passive atelectasis.  Hepatobiliary: Flattening of the posterior margin of the right hepatic lobe due to mass-effect from the right perinephric hematoma. Cholecystectomy. No biliary dilatation.  Pancreas: Unremarkable  Spleen: Small spleen, otherwise unremarkable.  Adrenals/Urinary Tract: Arising from the right kidney and extending into the perinephric space, there is a 13.6 by 8.8 by 6.1 cm hematoma with mixed precontrast signal intensity and no discernible internal enhancement. This has a convex border along the kidney (the kidney is concave) laterally suggesting a renal or subcapsular component, but does not confined itself to the renal capsule. No associated abnormal enhancement is identified on subtraction images to indicate underlying mass, although clearly motion artifact reduces  sensitivity. No compelling evidence that this is actually an aneurysm.  There is also a Bosniak category 1 cyst of the right kidney upper pole measuring 2.9 cm in diameter, as well as a 1.4 by 1.0 cm Bosniak category 1 cyst of the right mid to lower kidney.  Just below the left renal vein, there is a 1.7 by 1.1 cm retroperitoneal fluid signal intensity which does not enhance. Conceivably this could be a small exophytic cyst arising from the kidney although definite connection to the kidney is not seen. I also do not observe a definite connection to the left ureter although they are adjacent. This could reflect a dilated lymphatic structure.  There is edema in the right perinephric space and right retroperitoneum.  Stomach/Bowel: Unremarkable  Vascular/Lymphatic: Unremarkable  Other: No supplemental non-categorized findings.  Musculoskeletal: Suspected hemangioma eccentric to the right in the T12 vertebral bodies; similar lesion at T9.  IMPRESSION: 1. Stable appearance  of a large hematoma extending from the right mid kidney into the perinephric space. This could have a subcapsular component. The possibility of a renal cyst with internal bleeding and subsequent expansion into the perirenal space is raised as a possibility, given that an underlying mass lesion is not readily apparent. Our sensitivity for smaller mass lesions is reduced due to the severity of motion artifact. There are several adjacent cysts, in the right kidney upper pole and in the right mid to lower kidney, which are Bosniak category 1. A part of the hematoma does cause concavity along the adjacent renal margin, but the kidney is not flattened in the typical fashion as if the entire hematoma was subcapsular, and much of the hematoma is in the perinephric space. 2. Edema in the perinephric space and retroperitoneum, flattening of the posterior margin of the right hepatic lobe, and right pleural effusion may all be reactive. 3. Small left perirenal  fluid collection just below the left renal vein, probably representing a dilated lymphatic structure.   Electronically Signed   By: Van Clines M.D.   On: 03/04/2015 08:23   Ct Abdomen Pelvis W Contrast  03/03/2015   CLINICAL DATA:  Abdominal pain, bulging at the emboli kiss  EXAM: CT ABDOMEN AND PELVIS WITH CONTRAST  TECHNIQUE: Multidetector CT imaging of the abdomen and pelvis was performed using the standard protocol following bolus administration of intravenous contrast.  CONTRAST:  155mL OMNIPAQUE IOHEXOL 300 MG/ML  SOLN  COMPARISON:  None.  FINDINGS: Lower chest: Small right pleural effusion with compressive atelectasis.  Hepatobiliary: Normal liver without focal abnormality. Prior cholecystectomy. No intrahepatic or extrahepatic biliary ductal dilatation.  Pancreas: Normal.  Spleen: Normal.  Adrenals/Urinary Tract: Normal adrenal glands. Normal left kidney. No obstructive uropathy. Normal bladder.  8 x 9.8 x 13.4 cm right perinephric hematoma which appears to arise from the lateral interpolar aspect of the right kidney with a crescentic shaped area of renal cortex cupping the hematoma. There is hemorrhage extending inferiorly along the right psoas muscle. The hematoma displaces the kidney anteriorly and medially. There is a 3.2 cm hypodense, fluid attenuating right upper pole renal mass most consistent with a small cyst. There is a smaller cyst in the inferior pole of the right kidney.  Stomach/Bowel: No bowel dilatation or wall thickening. No extraluminal contrast. No pneumatosis, pneumoperitoneum or portal venous gas. Small amount of pelvic free fluid.  Vascular/Lymphatic: Normal caliber aorta with atherosclerosis. No abdominal or pelvic lymphadenopathy.  Reproductive: Prior hysterectomy.  Other: No focal fluid collection. 13 mm hyperdense nodule in the right anterior abdominal wall subcutaneous fat likely representing a small hematoma versus enhancing nodule.  Musculoskeletal: Degenerative disc  disease with disc height loss at L5-S1. Broad-based disc bulge at L5-S1. Bilateral facet arthropathy throughout the lumbar spine most severe at L4-5 and L5-S1. 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  IMPRESSION: 1. 8 x 9.8 x 13.4 cm right perinephric hematoma which appears to arise from the lateral interpolar aspect of the right kidney suggesting an underlying mass such as an angiomyolipoma or renal cell carcinoma versus renal trauma/laceration. Small amount of hemorrhage extending inferiorly along the right psoas muscle. Further evaluation with a MRI of the abdomen is recommended for evaluation of the kidney.  2. Small right pleural effusion. Critical Value/emergent results were called by telephone at the time of interpretation on 03/03/2015 at 6:26 pm to Dr. Joette Catching , who verbally acknowledged these results.   Electronically Signed   By: Elbert Ewings  Patel   On: 03/03/2015 18:28   Dg Pelvis Portable  03/01/2015   CLINICAL DATA:  The patient fell secondary to a seizure.  EXAM: PORTABLE PELVIS 1-2 VIEWS  COMPARISON:  Hip radiographs dated 03/18/2008  FINDINGS: There is no fracture or dislocation. There is slight arthritis of the left hip with marginal osteophytes on the inferior aspect of the acetabulum and on the femoral head. No joint space narrowing.  IMPRESSION: No acute abnormality.   Electronically Signed   By: Lorriane Shire M.D.   On: 03/01/2015 19:17   Dg Chest Port 1 View  02/06/2015   CLINICAL DATA:  Shortness of breath  EXAM: PORTABLE CHEST - 1 VIEW  COMPARISON:  Portable chest x-ray of 01/18/2015  FINDINGS: No active infiltrate or effusion is seen. Mediastinal and hilar contours are unremarkable. Heart size is stable. No bony abnormality is seen.  IMPRESSION: No active disease.   Electronically Signed   By: Ivar Drape M.D.   On: 02/06/2015 08:18   Dg Shoulder Right Port  02/06/2015   CLINICAL DATA:  59 year old female with right shoulder pain  EXAM: PORTABLE RIGHT SHOULDER -  2+ VIEW  COMPARISON:  None.  FINDINGS: There is no evidence of fracture or dislocation. There is no evidence of arthropathy or other focal bone abnormality. Soft tissues are unremarkable.  IMPRESSION: Negative.   Electronically Signed   By: Jacqulynn Cadet M.D.   On: 02/06/2015 11:02   Ct Maxillofacial Wo Cm  02/06/2015   CLINICAL DATA:  Initial evaluation for acute seizure, possible head injury.  EXAM: CT HEAD WITHOUT CONTRAST  CT MAXILLOFACIAL WITHOUT CONTRAST  TECHNIQUE: Multidetector CT imaging of the head and maxillofacial structures were performed using the standard protocol without intravenous contrast. Multiplanar CT image reconstructions of the maxillofacial structures were also generated.  COMPARISON:  Prior study from 01/18/2015  FINDINGS: CT HEAD FINDINGS  Atrophy with mild chronic microvascular ischemic changes present.  There is no acute intracranial hemorrhage or infarct. No mass lesion or midline shift. Gray-white matter differentiation is well maintained. Ventricles are normal in size without evidence of hydrocephalus. CSF containing spaces are within normal limits. No extra-axial fluid collection.  The calvarium is intact.  Orbital soft tissues are within normal limits.  The mastoid air cells are clear.  Scalp soft tissues are unremarkable.  CT MAXILLOFACIAL FINDINGS  No significant soft tissue swelling present within the face. Globes are intact. No retro-orbital pathology. Bony orbits are intact without evidence of orbital floor fracture.  Zygomatic arches are intact. Pterygoid plates are intact. Maxilla intact. No nasal bone fracture. Nasal septum midline.  Mandible intact. Mandibular condyles normally situated within the temporomandibular fossa.  Mild opacity present within the left sphenoid sinus. Paranasal sinuses are otherwise clear.  IMPRESSION: CT HEAD:  No acute intracranial process.  CT MAXILLOFACIAL:  No acute maxillofacial injury.   Electronically Signed   By: Jeannine Boga  M.D.   On: 02/06/2015 06:26    Labs:  CBC:  Recent Labs  02/06/15 1150 03/01/15 1655 03/04/15 0945 03/04/15 1150  WBC 7.4 8.9 9.1 9.3  HGB 13.3 13.3 6.4* 7.0*  HCT 38.7 39.4 18.8* 20.6*  PLT 235 389 212 257    COAGS:  Recent Labs  03/04/15 1150  INR 1.03  APTT 34    BMP:  Recent Labs  02/06/15 1150 02/07/15 1117 03/01/15 1655 03/04/15 0739  NA 139 139 140 134*  K 4.2 4.8 4.1 3.5  CL 106 108 106 101  CO2 23  21* 24 26  GLUCOSE 71 87 94 96  BUN 10 8 8 9   CALCIUM 8.8* 8.8* 9.3 8.9  CREATININE 0.76 0.76 1.08* 0.72  GFRNONAA >60 >60 56* >60  GFRAA >60 >60 >60 >60    LIVER FUNCTION TESTS:  Recent Labs  01/18/15 0610 02/06/15 0349 03/01/15 1655 03/04/15 0739  BILITOT 0.7 0.8 0.6 0.6  AST 21 23 41 18  ALT 10 16 15  11*  ALKPHOS 99 106 104 55  PROT 6.6 7.3 7.4 5.8*  ALBUMIN 3.6 3.9 4.0 3.0*    TUMOR MARKERS: No results for input(s): AFPTM, CEA, CA199, CHROMGRNA in the last 8760 hours.  Assessment and Plan:  Sz disorder- non compliance with meds Fell with sz at home 6/4 Developed abd and Rt flank pain CT 6/6 reveals Rt perinephric hematoma- stable appearing Hemodynamically stable now We have seen pt regarding the need for a possible renal arteriogram with embolization if hg would fall or VS change Risks and Benefits discussed with the patient including, but not limited to bleeding, infection, vascular injury or contrast induced renal failure. All of the patient's questions were answered, patient is agreeable to proceed--if needed. If pt able to be discharged without procedure would need to follow up in IR clinic with Dr Earleen Newport 2-4 weeks post discharge for discussion of renal arteriogram evaluation for source of bleed and possible treatments.   Thank you for this interesting consult.  I greatly enjoyed meeting [facility] and look forward to participating in their care.  Signed: Michala Deblanc A 03/04/2015, 1:13 PM   I spent a total of 40  Minutes    in face to face in clinical consultation, greater than 50% of which was counseling/coordinating care for renal arteriogram/embolization if needed for Rt perinephric hematoma

## 2015-03-04 NOTE — Progress Notes (Signed)
Pt is adamantly refusing Keppra, stating that it is the medication that causes her seizures, she was reeducated about mechanism of action of drug, pt still refused

## 2015-03-05 LAB — COMPREHENSIVE METABOLIC PANEL
ALT: 11 U/L — ABNORMAL LOW (ref 14–54)
ANION GAP: 9 (ref 5–15)
AST: 18 U/L (ref 15–41)
Albumin: 3.3 g/dL — ABNORMAL LOW (ref 3.5–5.0)
Alkaline Phosphatase: 63 U/L (ref 38–126)
BUN: 8 mg/dL (ref 6–20)
CHLORIDE: 103 mmol/L (ref 101–111)
CO2: 25 mmol/L (ref 22–32)
Calcium: 8.9 mg/dL (ref 8.9–10.3)
Creatinine, Ser: 0.71 mg/dL (ref 0.44–1.00)
GFR calc non Af Amer: >60 mL/min (ref >60)
GLUCOSE: 82 mg/dL (ref 65–99)
Potassium: 4 mmol/L (ref 3.5–5.1)
SODIUM: 137 mmol/L (ref 135–145)
TOTAL PROTEIN: 6.6 g/dL (ref 6.5–8.1)
Total Bilirubin: 1.3 mg/dL — ABNORMAL HIGH (ref 0.3–1.2)

## 2015-03-05 LAB — CBC
HCT: 28 % — ABNORMAL LOW (ref 36.0–46.0)
HCT: 28.1 % — ABNORMAL LOW (ref 36.0–46.0)
HEMATOCRIT: 28.3 % — AB (ref 36.0–46.0)
HEMOGLOBIN: 9.6 g/dL — AB (ref 12.0–15.0)
HEMOGLOBIN: 9.7 g/dL — AB (ref 12.0–15.0)
Hemoglobin: 9.7 g/dL — ABNORMAL LOW (ref 12.0–15.0)
MCH: 29.4 pg (ref 26.0–34.0)
MCH: 29.8 pg (ref 26.0–34.0)
MCH: 29.9 pg (ref 26.0–34.0)
MCHC: 34.5 g/dL (ref 30.0–36.0)
MCHC: 34.3 g/dL (ref 30.0–36.0)
MCHC: 34.3 g/dL (ref 30.0–36.0)
MCV: 85.6 fL (ref 78.0–100.0)
MCV: 86.2 fL (ref 78.0–100.0)
MCV: 87.3 fL (ref 78.0–100.0)
PLATELETS: 220 10*3/uL (ref 150–400)
Platelets: 231 10*3/uL (ref 150–400)
Platelets: 235 10*3/uL (ref 150–400)
RBC: 3.24 MIL/uL — ABNORMAL LOW (ref 3.87–5.11)
RBC: 3.26 MIL/uL — ABNORMAL LOW (ref 3.87–5.11)
RBC: 3.27 MIL/uL — ABNORMAL LOW (ref 3.87–5.11)
RDW: 15.7 % — ABNORMAL HIGH (ref 11.5–15.5)
RDW: 15.8 % — ABNORMAL HIGH (ref 11.5–15.5)
RDW: 15.2 % (ref 11.5–15.5)
WBC: 7.8 10*3/uL (ref 4.0–10.5)
WBC: 8.9 10*3/uL (ref 4.0–10.5)
WBC: 9.1 10*3/uL (ref 4.0–10.5)

## 2015-03-05 NOTE — Clinical Social Work Note (Signed)
CSW consult acknowledged:  Clinical Social Worker received consult for transportation at discharge. CSW will continue follow patient to arrange transportation at discharge.  CSW remains available.   Glendon Axe, MSW, LCSWA [phone] 03/05/2015 12:51 PM

## 2015-03-05 NOTE — Progress Notes (Signed)
[place] TEAM 1 - Stepdown/ICU TEAM Progress Note  Glanda Spanbauer HQI:[id number] DOB: [birth year] DOA: 03/01/2015 PCP: [name], MD  Admit HPI / Brief Narrative: 59 y.o. F Hx Seizures and Polysubstance abuse, asthma, and poor compliance with anti-seizure meds who presented to the ED with multiple episodes of generalized seizures. She was hospitalized on 5/13 with similar presentation. At that time depakote level was low. Discharged on depakote and keppra. Family indicated to Neurologist that she may not have been compliant with her meds again.    HPI/Subjective: Patient denies any bleeding overnight, is hungry and would like to eat,. No dizziness, lightheadedness, chest pain or dyspnea. Having diarrhea and cannot tell if it's any better than yesterday.  Assessment/Plan:   Seizure  -Multifactorial to include noncompliance with seizure medication and substance abuse -Continue Keppra 750 mg BID-had been inadvertently discontinued 6/6-resumed 6/7 -Continue valproate 500 mg BID - No further seizures reported.  Right perinephric hematoma - Evaluation of right flank pain since 6/6 has revealed large right perinephric hematoma on CT and then MRI of abdomen. - This may be secondary to fall with seizure at home on 6/4. However she may have underlying lesion and will need further evaluation for same. - Urology consulted and discussed with Dr. Louis Meckel: Recommended conservative treatment initially including bedrest, Foley catheter, nothing by mouth status in case intervention is warranted, monitor hemoglobin closely, transfuse as needed. If she decompensates or hemoglobin does not remain stable, may need embolization by IR-consulted. - As per IR, if patient able to discharge without procedure, would need to follow up in IR clinic with Dr. Earleen Newport 2-4 weeks post discharge for discussion of renal arteriogram evaluation for source of bleed and possible treatments. - Pain control. -. Serial  Hemoglobin is stable No surgical intervention at this time, therefore will initiate diet  Acute posthemorrhagic anemia - Secondary to right perinephric hematoma -Status post 2 units of PRBCs and follow CBCs closely. Transfuse if hemoglobin less than 8 g per DL. We will discontinue serial CBC and recheck CBC in the morning If stable the patient can discharge home in the morning  C. difficile colitis - Continue metronidazole and complete 14 days' treatment  C-spine / head trauma -CT C-spine shows no acute traumatic fractures, DC c-collar  Encephalopathy -Post-ictal state - appears to have returned to her baseline mental status -Seizure precautions   Polysubstance abuse -UDS positive for marijuana and cocaine - patient admits to use of marijuana but denies chronic cocaine abuse - she has been counseled directly and clearly about the absolute need to avoid all substances of abuse and voices understanding of same -Negative for alcohol     Code Status: FULL Family Communication: no family present at time of exam Disposition Plan: DC in the morning if hemoglobin is stable .  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 -Mild grade 1 retrolisthesis of C4 on C5.  03/03/2015 CT abdomen and pelvis with contrast: IMPRESSION: 1. 8 x 9.8 x 13.4 cm right perinephric hematoma which appears to arise from the lateral interpolar aspect of the right kidney suggesting an underlying mass such as an angiomyolipoma or renal cell carcinoma versus renal trauma/laceration. Small amount of hemorrhage extending inferiorly along the right psoas  muscle. Further evaluation with  a MRI of the abdomen is recommended for evaluation of the kidney.  2. Small right pleural effusion.  03/03/15: MRI of the abdomen with and without contrast IMPRESSION: 1. Stable appearance of a large hematoma extending from the right mid kidney into the perinephric space. This could have a subcapsular component. The possibility of a renal cyst with internal bleeding and subsequent expansion into the perirenal space is raised as a possibility, given that an underlying mass lesion is not readily apparent. Our sensitivity for smaller mass lesions is reduced due to the severity of motion artifact. There are several adjacent cysts, in the right kidney upper pole and in the right mid to lower kidney, which are Bosniak category 1. A part of the hematoma does cause concavity along the adjacent renal margin, but the kidney is not flattened in the typical fashion as if the entire hematoma was subcapsular, and much of the hematoma is in the perinephric space. 2. Edema in the perinephric space and retroperitoneum, flattening of the posterior margin of the right hepatic lobe, and right pleural effusion may all be reactive. 3. Small left perirenal fluid collection just below the left renal vein, probably representing a dilated lymphatic structure  Antibiotics: NA  DVT prophylaxis: SCDs. Discontinued subcutaneous heparin secondary to hemorrhage.  Objective: Filed Vitals:   03/04/15 2100 03/05/15 0100 03/05/15 0602 03/05/15 1010  BP: 171/73 142/75 140/82 141/64  Pulse: 91 84 79 76  Temp: 98.2 F (36.8 C) 98.2 F (36.8 C) 98.4 F (36.9 C) 98.1 F (36.7 C)  TempSrc: Oral Oral Oral Oral  Resp: 15 18 18 18   Height:      Weight:      SpO2: 100% 98% 100% 100%      Intake/Output Summary (Last 24 hours) at 03/05/15 1322 Last data filed at 03/05/15 0962  Gross per 24 hour  Intake    385 ml  Output   1350 ml  Net   -965 ml   Exam: General: No acute  respiratory distress Lungs: Clear to auscultation bilaterally without wheezes or crackles Cardiovascular: Regular rate and rhythm without murmur gallop or rub normal S1 and S2 Abdomen: Nondistended, soft, bowel sounds positive, no rebound, diffusely tender throughout entire right lower quadrant, no masses appreciable. No peritoneal signs. No external evidence of bleeding/ecchymosis or trauma. Patient has scars of previous skin graft in right lower extremity and possibly right lower abdomen. Patient was examined with a female nurse/charge nurse in the room. Extremities: No significant cyanosis, clubbing, or edema bilateral lower extremities Neurologic:  Alert and oriented 4, cranial nerves II through XII intact bilaterally, 5 over 5 strength bilateral upper and lower stream is  Data Reviewed: Basic Metabolic Panel:  Recent Labs Lab 03/01/15 1655 03/04/15 0739 03/05/15 0442  NA 140 134* 137  K 4.1 3.5 4.0  CL 106 101 103  CO2 24 26 25   GLUCOSE 94 96 82  BUN 8 9 8   CREATININE 1.08* 0.72 0.71  CALCIUM 9.3 8.9 8.9   Liver Function Tests:  Recent Labs Lab 03/01/15 1655 03/04/15 0739 03/05/15 0442  AST 41 18 18  ALT 15 11* 11*  ALKPHOS 104 55 63  BILITOT 0.6 0.6 1.3*  PROT 7.4 5.8* 6.6  ALBUMIN 4.0 3.0* 3.3*   CBC:  Recent Labs Lab 03/01/15 1655  03/04/15 1150 03/04/15 1945 03/04/15 2349 03/05/15 0442 03/05/15 1120  WBC 8.9  < > 9.3 8.1 9.1 8.9 7.8  NEUTROABS 7.5  --   --   --   --   --   --  HGB 13.3  < > 7.0* 9.1* 9.6* 9.7* 9.7*  HCT 39.4  < > 20.6* 26.1* 28.0* 28.1* 28.3*  MCV 89.5  < > 89.6 85.6 85.6 86.2 87.3  PLT 389  < > 257 202 231 220 235  < > = values in this interval not displayed.  Studies:  Recent x-ray studies have been reviewed in detail by the Attending Physician  Scheduled Meds:  Scheduled Meds: . diclofenac sodium  4 g Topical QID  . divalproex  500 mg Oral Q12H  . feeding supplement (ENSURE ENLIVE)  237 mL Oral BID BM  . folic acid  1 mg  Oral Daily  . levETIRAcetam  750 mg Oral BID  . magnesium oxide  400 mg Oral BID  . metroNIDAZOLE  500 mg Oral 3 times per day  . multivitamin with minerals  1 tablet Oral Daily  . sodium chloride  3 mL Intravenous Q12H  . thiamine  100 mg Oral Daily    Time spent on care of this patient: 58 mins  Lova Urbieta, MD,  [facility] Pager [phone]  If 7PM-7AM, please contact night-coverage [email or web address] Password [email or web address] 03/05/2015, 1:22 PM   LOS: 3 days

## 2015-03-05 NOTE — Progress Notes (Signed)
Hemoglobin is now stable. D/c foley Liberalize activity. OK to discharge after hgb stable x 24 hours. Will schedule follow-up with urology in 3 months.

## 2015-03-06 ENCOUNTER — Other Ambulatory Visit: Payer: Self-pay | Admitting: Radiology

## 2015-03-06 DIAGNOSIS — S37091D Other injury of right kidney, subsequent encounter: Secondary | ICD-10-CM

## 2015-03-06 LAB — COMPREHENSIVE METABOLIC PANEL
ALT: 10 U/L — AB (ref 14–54)
AST: 15 U/L (ref 15–41)
Albumin: 3.1 g/dL — ABNORMAL LOW (ref 3.5–5.0)
Alkaline Phosphatase: 62 U/L (ref 38–126)
Anion gap: 8 (ref 5–15)
BUN: 12 mg/dL (ref 6–20)
CHLORIDE: 100 mmol/L — AB (ref 101–111)
CO2: 28 mmol/L (ref 22–32)
CREATININE: 0.68 mg/dL (ref 0.44–1.00)
Calcium: 9 mg/dL (ref 8.9–10.3)
GFR calc non Af Amer: >60 mL/min (ref >60)
Glucose, Bld: 74 mg/dL (ref 65–99)
Potassium: 4.4 mmol/L (ref 3.5–5.1)
SODIUM: 136 mmol/L (ref 135–145)
Total Bilirubin: 0.9 mg/dL (ref 0.3–1.2)
Total Protein: 6.2 g/dL — ABNORMAL LOW (ref 6.5–8.1)

## 2015-03-06 LAB — CBC
HEMATOCRIT: 28.6 % — AB (ref 36.0–46.0)
HEMOGLOBIN: 9.9 g/dL — AB (ref 12.0–15.0)
MCH: 30.3 pg (ref 26.0–34.0)
MCHC: 34.6 g/dL (ref 30.0–36.0)
MCV: 87.5 fL (ref 78.0–100.0)
Platelets: 191 10*3/uL (ref 150–400)
RBC: 3.27 MIL/uL — ABNORMAL LOW (ref 3.87–5.11)
RDW: 15.4 % (ref 11.5–15.5)
WBC: 6.7 10*3/uL (ref 4.0–10.5)

## 2015-03-06 MED ORDER — OXYCODONE HCL 5 MG PO TABS
5.0000 mg | ORAL_TABLET | ORAL | Status: DC | PRN
  Administered 2015-03-06 – 2015-03-08 (×11): 5 mg via ORAL
  Filled 2015-03-06 (×11): qty 1

## 2015-03-06 NOTE — Evaluation (Signed)
Occupational Therapy Evaluation Patient Details Name: Lindsey [name] MRN: [id number] DOB: 02-18-1956 Today's Date: 03/06/2015    History of Present Illness Pt was admitted for seizure, ? noncompliant with medication.   She has a perinephric hematoma, possibly from fall related to seizure.   PMH significant for seizures, polysubstance abuse.     Clinical Impression   This 59 year old female was admitted for the above. Unsure of PLOF.  Pt currently needs min A for SPT and max A for LB adls.  She will benefit from skilled OT to increase safety and independence with adls.  Goals in acute are for min guard to min A     Follow Up Recommendations  SNF;Supervision/Assistance - 24 hour    Equipment Recommendations  3 in 1 bedside comode    Recommendations for Other Services       Precautions / Restrictions Precautions Precautions: Fall Restrictions Weight Bearing Restrictions: No      Mobility Bed Mobility Overal bed mobility: Needs Assistance Bed Mobility: Supine to Sit     Supine to sit: Min assist     General bed mobility comments: used rails, extra time, min A for safety as pt became off balance due to trunk flexed forward  Transfers Overall transfer level: Needs assistance Equipment used: 1 person hand held assist Transfers: Sit to/from [facility] Sit to Stand: Min assist Stand pivot transfers: Min assist       General transfer comment: min A to power up and stabilize     Balance Overall balance assessment: Needs assistance Sitting-balance support: Feet supported Sitting balance-Leahy Scale: Fair Sitting balance - Comments: min guard for safety   Standing balance support: No upper extremity supported;During functional activity Standing balance-Leahy Scale: Poor                              ADL Overall ADL's : Needs assistance/impaired     Grooming: Wash/dry hands;Bed level;Set up   Upper Body Bathing: Set up;Sitting    Lower Body Bathing: Maximal assistance;Sit to/from stand   Upper Body Dressing : Minimal assistance;Bed level   Lower Body Dressing: Maximal assistance;Sit to/from stand   Toilet Transfer: Minimal assistance;Stand-pivot;BSC   Toileting- Clothing Manipulation and Hygiene: Minimal assistance;Sit to/from stand         General ADL Comments: Pt very unsteady.  She initially was off balance when she sat up at EOB--min guard for safety.  Pt's ADLs are limited by pain.       Vision     Perception     Praxis      Pertinent Vitals/Pain Pain Assessment: 0-10 Pain Score: 10-Worst pain ever Pain Location: R flank Pain Descriptors / Indicators: Aching Pain Intervention(s): Limited activity within patient's tolerance;Monitored during session;Repositioned;RN gave pain meds during session     Hand Dominance Right   Extremity/Trunk Assessment Upper Extremity Assessment Upper Extremity Assessment: Generalized weakness           Communication Communication Communication: No difficulties   Cognition Arousal/Alertness: Awake/alert Behavior During Therapy: WFL for tasks assessed/performed Overall Cognitive Status: No family/caregiver present to determine baseline cognitive functioning Area of Impairment: Following commands;Problem solving;Safety/judgement;Awareness       Following Commands: Follows one step commands with increased time Safety/Judgement: Decreased awareness of safety;Decreased awareness of deficits Awareness: Intellectual Problem Solving: Slow processing General Comments: pt slow to process, cues for safety   General Comments       Exercises  Shoulder Instructions      Home Living Family/patient expects to be discharged to:: Unsure                                 Additional Comments: pt needs 24/7 assistance or SNF      Prior Functioning/Environment          Comments: unsure of PLOF    OT Diagnosis: Generalized  weakness;Acute pain;Cognitive deficits   OT Problem List: Decreased strength;Decreased activity tolerance;Impaired balance (sitting and/or standing);Decreased cognition;Decreased knowledge of use of DME or AE;Decreased safety awareness;Pain   OT Treatment/Interventions: Self-care/ADL training;DME and/or AE instruction;Cognitive remediation/compensation;Therapeutic activities;Balance training;Patient/family education    OT Goals(Current goals can be found in the care plan section) Acute Rehab OT Goals Patient Stated Goal: none stated; agreeable to OT and wanted to use 3:1 commode OT Goal Formulation: With patient Time For Goal Achievement: 03/13/15 Potential to Achieve Goals: Good ADL Goals Pt Will Perform Lower Body Bathing: with min assist;with adaptive equipment;sit to/from stand Pt Will Perform Lower Body Dressing: with min assist;with adaptive equipment;sit to/from stand Pt Will Transfer to Toilet: with min guard assist;ambulating;bedside commode Pt Will Perform Toileting - Clothing Manipulation and hygiene: with min guard assist;sit to/from stand Additional ADL Goal #1: pt will sit eob and perform UB adls with set up/supervision Additional ADL Goal #2: Pt will complete ADL/toilet transfers with one cue per task for safety   OT Frequency: Min 2X/week   Barriers to D/C:            Co-evaluation              End of Session    Activity Tolerance: Patient limited by fatigue;Patient limited by pain Patient left: in bed;with call bell/phone within reach;with bed alarm set   Time: 1400-1419 OT Time Calculation (min): 19 min Charges:  OT General Charges $OT Visit: 1 Procedure OT Evaluation $Initial OT Evaluation Tier I: 1 Procedure G-Codes:    Danthony Kendrix 2015-03-19, 3:43 PM Lesle Chris, OTR/L [phone] 2015/03/19

## 2015-03-06 NOTE — Progress Notes (Signed)
Spoke with patient's brother this evening. He requested that Social worker please contact him re discharge plans. Lindsey [name] [phone].

## 2015-03-06 NOTE — Progress Notes (Addendum)
[place] TEAM 1 - Stepdown/ICU TEAM Progress Note  Lindsey [name] ELF:[id number] DOB: 1956-05-11 DOA: 03/01/2015 PCP: [name], MD  Admit HPI / Brief Narrative: 59 y.o. F Hx Seizures and Polysubstance abuse, asthma, and poor compliance with anti-seizure meds who presented to the ED with multiple episodes of generalized seizures. She was hospitalized on 5/13 with similar presentation. At that time depakote level was low. Discharged on depakote and keppra. Family indicated to Neurologist that she may not have been compliant with her meds again.    HPI/Subjective: Flank pain is significantly better than yesterday  Assessment/Plan:   Seizure  -Multifactorial to include noncompliance with seizure medication and substance abuse -Continue Keppra 750 mg BID  -Continue valproate 500 mg BID - No further seizures reported.  Right perinephric hematoma 6/6 discovered to have large right perinephric hematoma on CT and then MRI of abdomen. - This may be secondary to fall with seizure at home on 6/4. However she may have underlying lesion and will need further evaluation for same. - Urology consulted and discussed with Dr. Louis Meckel: Recommended conservative treatment initially including bedrest, Foley catheter, nothing by mouth status in case intervention is warranted, monitor hemoglobin closely, transfuse as needed. If she decompensates or hemoglobin does not remain stable, may need embolization by IR-consulted. - As per IR, if patient able to discharge without procedure, would need to follow up in IR clinic with Dr. Earleen Newport 2-4 weeks post discharge for discussion of renal arteriogram evaluation for source of bleed and possible treatments. - Pain control. -. Serial Hemoglobin is stable No surgical intervention at this time, therefore will initiate diet  Acute posthemorrhagic anemia - Secondary to right perinephric hematoma -Status post 2 units of PRBCs and follow CBCs closely. Transfuse  if hemoglobin less than 8 g per DL. Hemoglobin has remained stable 2 days  C. difficile colitis - Continue metronidazole and complete 14 days' treatment  C-spine / head trauma -CT C-spine shows no acute traumatic fractures, DC c-collar  Encephalopathy -Post-ictal state - appears to have returned to her baseline mental status -Seizure precautions   Polysubstance abuse -UDS positive for marijuana and cocaine - patient admits to use of marijuana but denies chronic cocaine abuse - she has been counseled directly and clearly about the absolute need to avoid all substances of abuse and voices understanding of same -Negative for alcohol     Code Status: FULL Family Communication: no family present at time of exam Disposition Plan: PT/OT eval as the patient appears to be quite deconditioned, anticipate discharge tomorrow .  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 -Mild grade 1 retrolisthesis of C4 on C5.  03/03/2015 CT abdomen and pelvis with contrast: IMPRESSION: 1. 8 x 9.8 x 13.4 cm right perinephric hematoma which appears to arise from the lateral interpolar aspect of the right kidney suggesting an underlying mass such as an angiomyolipoma or renal cell carcinoma versus renal trauma/laceration. Small amount of hemorrhage extending inferiorly along the right psoas muscle. Further evaluation with a MRI of the abdomen is recommended for evaluation of the kidney.  2. Small right pleural effusion.  03/03/15: MRI of the abdomen with and without contrast IMPRESSION: 1. Stable appearance of a large hematoma extending  from the right mid kidney into  the perinephric space. This could have a subcapsular component. The possibility of a renal cyst with internal bleeding and subsequent expansion into the perirenal space is raised as a possibility, given that an underlying mass lesion is not readily apparent. Our sensitivity for smaller mass lesions is reduced due to the severity of motion artifact. There are several adjacent cysts, in the right kidney upper pole and in the right mid to lower kidney, which are Bosniak category 1. A part of the hematoma does cause concavity along the adjacent renal margin, but the kidney is not flattened in the typical fashion as if the entire hematoma was subcapsular, and much of the hematoma is in the perinephric space. 2. Edema in the perinephric space and retroperitoneum, flattening of the posterior margin of the right hepatic lobe, and right pleural effusion may all be reactive. 3. Small left perirenal fluid collection just below the left renal vein, probably representing a dilated lymphatic structure  Antibiotics: NA  DVT prophylaxis: SCDs. Discontinued subcutaneous heparin secondary to hemorrhage.  Objective: Filed Vitals:   03/05/15 2100 03/06/15 0142 03/06/15 0502 03/06/15 0927  BP: 136/76 144/73 131/72 142/62  Pulse: 78 92 75 80  Temp: 98.6 F (37 C) 97.5 F (36.4 C) 98.2 F (36.8 C) 97.7 F (36.5 C)  TempSrc: Oral Oral Oral Oral  Resp: 16 18 20 20   Height:      Weight:      SpO2: 100% 100% 100% 100%      Intake/Output Summary (Last 24 hours) at 03/06/15 1253 Last data filed at 03/06/15 1610  Gross per 24 hour  Intake    310 ml  Output   1200 ml  Net   -890 ml   Exam: General: No acute respiratory distress Lungs: Clear to auscultation bilaterally without wheezes or crackles Cardiovascular: Regular rate and rhythm without murmur gallop or rub normal S1 and S2 Abdomen: Nondistended, soft, bowel sounds positive, no rebound, diffusely tender  throughout entire right lower quadrant, no masses appreciable. No peritoneal signs. No external evidence of bleeding/ecchymosis or trauma. Patient has scars of previous skin graft in right lower extremity and possibly right lower abdomen. Patient was examined with a female nurse/charge nurse in the room. Extremities: No significant cyanosis, clubbing, or edema bilateral lower extremities Neurologic:  Alert and oriented 4, cranial nerves II through XII intact bilaterally, 5 over 5 strength bilateral upper and lower stream is  Data Reviewed: Basic Metabolic Panel:  Recent Labs Lab 03/01/15 1655 03/04/15 0739 03/05/15 0442 03/06/15 0456  NA 140 134* 137 136  K 4.1 3.5 4.0 4.4  CL 106 101 103 100*  CO2 24 26 25 28   GLUCOSE 94 96 82 74  BUN 8 9 8 12   CREATININE 1.08* 0.72 0.71 0.68  CALCIUM 9.3 8.9 8.9 9.0   Liver Function Tests:  Recent Labs Lab 03/01/15 1655 03/04/15 0739 03/05/15 0442 03/06/15 0456  AST 41 18 18 15   ALT 15 11* 11* 10*  ALKPHOS 104 55 63 62  BILITOT 0.6 0.6 1.3* 0.9  PROT 7.4 5.8* 6.6 6.2*  ALBUMIN 4.0 3.0* 3.3* 3.1*   CBC:  Recent Labs Lab 03/01/15 1655  03/04/15 1945 03/04/15 2349 03/05/15 0442 03/05/15 1120 03/06/15 0456  WBC 8.9  < > 8.1 9.1 8.9 7.8 6.7  NEUTROABS 7.5  --   --   --   --   --   --   HGB 13.3  < > 9.1* 9.6* 9.7* 9.7* 9.9*  HCT 39.4  < >  26.1* 28.0* 28.1* 28.3* 28.6*  MCV 89.5  < > 85.6 85.6 86.2 87.3 87.5  PLT 389  < > 202 231 220 235 191  < > = values in this interval not displayed.  Studies:  Recent x-ray studies have been reviewed in detail by the Attending Physician  Scheduled Meds:  Scheduled Meds: . diclofenac sodium  4 g Topical QID  . divalproex  500 mg Oral Q12H  . feeding supplement (ENSURE ENLIVE)  237 mL Oral BID BM  . folic acid  1 mg Oral Daily  . levETIRAcetam  750 mg Oral BID  . magnesium oxide  400 mg Oral BID  . metroNIDAZOLE  500 mg Oral 3 times per day  . multivitamin with minerals  1 tablet  Oral Daily  . sodium chloride  3 mL Intravenous Q12H  . thiamine  100 mg Oral Daily    Time spent on care of this patient: 74 mins  Jyllian Haynie, MD,  [facility] Pager [phone]  If 7PM-7AM, please contact night-coverage [email or web address] Password [email or web address] 03/06/2015, 12:53 PM   LOS: 4 days

## 2015-03-06 NOTE — Progress Notes (Signed)
Discussed case with Dr. Earleen Newport today and will setup IR clinic follow up in 2 months with repeat imaging in approximately 3 months.   Tsosie Billing PA-C Interventional Radiology  03/06/15  2:53 PM

## 2015-03-07 LAB — CBC
HCT: 26.8 % — ABNORMAL LOW (ref 36.0–46.0)
Hemoglobin: 9.1 g/dL — ABNORMAL LOW (ref 12.0–15.0)
MCH: 30 pg (ref 26.0–34.0)
MCHC: 34 g/dL (ref 30.0–36.0)
MCV: 88.4 fL (ref 78.0–100.0)
PLATELETS: 271 10*3/uL (ref 150–400)
RBC: 3.03 MIL/uL — ABNORMAL LOW (ref 3.87–5.11)
RDW: 15.1 % (ref 11.5–15.5)
WBC: 8 10*3/uL (ref 4.0–10.5)

## 2015-03-07 LAB — COMPREHENSIVE METABOLIC PANEL
ALT: 8 U/L — AB (ref 14–54)
AST: 17 U/L (ref 15–41)
Albumin: 2.8 g/dL — ABNORMAL LOW (ref 3.5–5.0)
Alkaline Phosphatase: 57 U/L (ref 38–126)
Anion gap: 9 (ref 5–15)
BUN: 11 mg/dL (ref 6–20)
CALCIUM: 8.8 mg/dL — AB (ref 8.9–10.3)
CHLORIDE: 101 mmol/L (ref 101–111)
CO2: 27 mmol/L (ref 22–32)
Creatinine, Ser: 0.66 mg/dL (ref 0.44–1.00)
GFR calc Af Amer: >60 mL/min (ref >60)
GFR calc non Af Amer: >60 mL/min (ref >60)
GLUCOSE: 76 mg/dL (ref 65–99)
Potassium: 4.4 mmol/L (ref 3.5–5.1)
Sodium: 137 mmol/L (ref 135–145)
TOTAL PROTEIN: 5.4 g/dL — AB (ref 6.5–8.1)
Total Bilirubin: 1 mg/dL (ref 0.3–1.2)

## 2015-03-07 NOTE — Evaluation (Signed)
Physical Therapy Evaluation Patient Details Name: Lindsey [name] MRN: [id number] DOB: 02/01/1956 Today's Date: 03/07/2015   History of Present Illness  Pt was admitted for seizure, ? noncompliant with medication.   She has a perinephric hematoma, possibly from fall related to seizure.   PMH significant for seizures, polysubstance abuse.    Clinical Impression  Pt impulsive and at times difficult to stay on task.  Pt with unclear baseline level of cognition and unclear if pt will have adequate support at home.  Feel SNF is safest D/C option for pt at this time.  Will continue to follow.      Follow Up Recommendations SNF    Equipment Recommendations  None recommended by PT    Recommendations for Other Services       Precautions / Restrictions Precautions Precautions: Fall Restrictions Weight Bearing Restrictions: No      Mobility  Bed Mobility Overal bed mobility: Needs Assistance Bed Mobility: Supine to Sit     Supine to sit: Min guard;HOB elevated     General bed mobility comments: pt utilizes bed rails to A with coming to sitting.    Transfers Overall transfer level: Needs assistance Equipment used: 1 person hand held assist Transfers: Sit to/from Stand Sit to Stand: Min guard Stand pivot transfers: Min assist       General transfer comment: pt with heavy use of UEs when coming to standing, but only needed A once standing and for performing pivot to chair for balance.    Ambulation/Gait                Stairs            Wheelchair Mobility    Modified Rankin (Stroke Patients Only)       Balance Overall balance assessment: Needs assistance Sitting-balance support: No upper extremity supported;Feet supported Sitting balance-Leahy Scale: Good     Standing balance support: No upper extremity supported;During functional activity Standing balance-Leahy Scale: Poor Standing balance comment: pt able to stand for peri hygiene, but impulsively  reaches out for chair armrest and will pick up one leg while leaning on bed rail.                               Pertinent Vitals/Pain Pain Assessment: 0-10 Pain Score: 10-Worst pain ever Pain Location: R flank- pt states blood in her Kidney Pain Descriptors / Indicators: Aching Pain Intervention(s): Limited activity within patient's tolerance;Monitored during session;Premedicated before session;Repositioned    Home Living Family/patient expects to be discharged to:: Unsure                 Additional Comments: pt needs 24/7 assistance or SNF    Prior Function           Comments: unsure of PLOF     Hand Dominance   Dominant Hand: Right    Extremity/Trunk Assessment   Upper Extremity Assessment: Defer to OT evaluation           Lower Extremity Assessment: Generalized weakness      Cervical / Trunk Assessment: Normal  Communication   Communication: No difficulties  Cognition Arousal/Alertness: Awake/alert Behavior During Therapy: Impulsive Overall Cognitive Status: No family/caregiver present to determine baseline cognitive functioning Area of Impairment: Attention;Following commands;Safety/judgement;Awareness;Problem solving   Current Attention Level: Selective   Following Commands: Follows one step commands with increased time Safety/Judgement: Decreased awareness of safety;Decreased awareness of deficits Awareness: Intellectual Problem Solving: Slow processing;Difficulty  sequencing;Requires verbal cues;Requires tactile cues General Comments: Unclear if this is pt's baseline level of cognition.      General Comments      Exercises        Assessment/Plan    PT Assessment Patient needs continued PT services  PT Diagnosis Difficulty walking;Generalized weakness   PT Problem List Decreased strength;Decreased activity tolerance;Decreased balance;Decreased mobility;Decreased coordination;Decreased cognition;Decreased knowledge of use of  DME;Decreased safety awareness;Pain  PT Treatment Interventions DME instruction;Gait training;Functional mobility training;Therapeutic activities;Therapeutic exercise;Stair training;Balance training;Cognitive remediation;Patient/family education   PT Goals (Current goals can be found in the Care Plan section) Acute Rehab PT Goals Patient Stated Goal: pt not stating when asked.   PT Goal Formulation: With patient Time For Goal Achievement: 03/21/15 Potential to Achieve Goals: Good    Frequency Min 3X/week   Barriers to discharge Other (comment) Unclear level of A pt has at home.      Co-evaluation               End of Session   Activity Tolerance: Patient limited by fatigue Patient left: in chair;with call bell/phone within reach;with chair alarm set Nurse Communication: Mobility status         Time: 2111-7356 PT Time Calculation (min) (ACUTE ONLY): 29 min   Charges:   PT Evaluation $Initial PT Evaluation Tier I: 1 Procedure PT Treatments $Therapeutic Activity: 8-22 mins   PT G CodesCatarina Hartshorn, [place] [phone] 03/07/2015, 1:48 PM

## 2015-03-07 NOTE — Clinical Social Work Note (Signed)
Patient's family declined SNF placement and prefers Home Health. CSW notified RNCM. Full psychosocial assessment completed below.   Clinical Social Worker will sign off for now as social work intervention is no longer needed. Please consult [place] again if new need arises.  Glendon Axe, MSW, [place] [phone] 03/07/2015 2:08 PM

## 2015-03-07 NOTE — Progress Notes (Signed)
[facility] TEAM 1 - Stepdown/ICU TEAM Progress Note  Lindsey [name] UEA:[id number] DOB: [birth year] DOA: 03/01/2015 PCP: [name], MD  Admit HPI / Brief Narrative: 59 y.o. F Hx Seizures and Polysubstance abuse, asthma, and poor compliance with anti-seizure meds who presented to the ED with multiple episodes of generalized seizures. She was hospitalized on 5/13 with similar presentation. At that time depakote level was low. Discharged on depakote and keppra. Family indicated to Neurologist that she may not have been compliant with her meds again.    HPI/Subjective:  patient appears to have a very short-term memory, does not remember the events from yesterday , pain is improving  Assessment/Plan:   Seizure  -Multifactorial to include noncompliance with seizure medication and substance abuse -Continue Keppra 750 mg BID  -Continue valproate 500 mg BID - No further seizures reported.  Right perinephric hematoma 6/6 discovered to have large right perinephric hematoma on CT and then MRI of abdomen. - This may be secondary to fall with seizure at home on 6/4. - Urology consulted and discussed with Dr. Louis Meckel: Recommended conservative treatment initially including bedrest, Foley catheter, nothing by mouth status in case intervention is warranted,  Hemoglobin has been stable . If she decompensates or hemoglobin drops , may need embolization by IR-consulted. - As per IR, if patient able to discharge without procedure, would need to follow up in IR clinic with Dr. Earleen Newport 2-4 weeks post discharge for discussion of renal arteriogram evaluation for source of bleed and possible treatments. - Pain control improved after being started on oxycodone . -. Serial Hemoglobin is stable No surgical intervention at this time, therefore will initiate diet   Acute posthemorrhagic anemia - Secondary to right perinephric hematoma -Status post 2 units of PRBCs on 6/7 Hemoglobin stable around  9 Hemoglobin has remained stable 3 days  C. difficile colitis - Continue metronidazole and complete 14 days' treatment, completes 6/20  C-spine / head trauma -CT C-spine shows no acute traumatic fractures, DC c-collar  Encephalopathy -Post-ictal state - appears to have returned to her baseline mental status -Seizure precautions   Polysubstance abuse -UDS positive for marijuana and cocaine - patient admits to use of marijuana but denies chronic cocaine abuse - she has been counseled directly and clearly about the absolute need to avoid all substances of abuse and voices understanding of same -Negative for alcohol     Code Status: FULL Family Communication: no family present at time of exam Disposition Plan: PT/OT eval as the patient appears to be quite deconditioned, recommends SNF , anticipate DC over the weekend  .  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 -Mild grade 1 retrolisthesis of C4 on C5.  03/03/2015 CT abdomen and pelvis with contrast: IMPRESSION: 1. 8 x 9.8 x 13.4 cm right perinephric hematoma which appears to arise from the lateral interpolar aspect of the right kidney suggesting an underlying mass such as an angiomyolipoma or renal cell carcinoma versus renal trauma/laceration. Small amount of hemorrhage extending inferiorly along the right psoas muscle. Further evaluation with a MRI of the abdomen is recommended for evaluation of the kidney.  2. Small right pleural effusion.  03/03/15: MRI of the abdomen with and without contrast IMPRESSION: 1. Stable appearance of a large hematoma extending  from the right mid kidney into the perinephric space. This could have a subcapsular component. The possibility of a renal cyst with internal bleeding and subsequent expansion into the perirenal space is raised as a possibility, given that an underlying mass lesion is not readily apparent. Our sensitivity for smaller mass lesions is reduced due to the severity of motion artifact. There are several adjacent cysts, in the right kidney upper pole and in the right mid to lower kidney, which are Bosniak category 1. A part of the hematoma does cause concavity along the adjacent renal margin, but the kidney is not flattened in the typical fashion as if the entire hematoma was subcapsular, and much of the hematoma is in the perinephric space. 2. Edema in the perinephric space and retroperitoneum, flattening of the posterior margin of the right hepatic lobe, and right pleural effusion may all be reactive. 3. Small left perirenal fluid collection just below the left renal vein, probably representing a dilated lymphatic structure  Antibiotics: Anti-infectives    Start     Dose/Rate Route Frequency Ordered Stop   03/03/15 1615  metroNIDAZOLE (FLAGYL) tablet 500 mg     500 mg Oral 3 times per day 03/03/15 1604 03/17/15 1359       DVT prophylaxis: SCDs. Discontinued subcutaneous heparin secondary to hemorrhage.  Objective: Filed Vitals:   03/06/15 1748 03/06/15 2057 03/07/15 0300 03/07/15 0600  BP: 149/70 149/66 130/65 121/66  Pulse: 91 103 78 86  Temp: 97.7 F (36.5 C) 97.9 F (36.6 C) 97.9 F (36.6 C) 97.7 F (36.5 C)  TempSrc: Oral Oral Oral Oral  Resp: 20 14 14 16   Height:      Weight:      SpO2: 100% 98% 100% 98%      Intake/Output Summary (Last 24 hours) at 03/07/15 1347 Last data filed at 03/06/15 1500  Gross per 24 hour  Intake      0 ml  Output      0 ml  Net      0 ml   Exam: General: No acute respiratory distress Lungs: Clear to auscultation bilaterally without  wheezes or crackles Cardiovascular: Regular rate and rhythm without murmur gallop or rub normal S1 and S2 Abdomen: Nondistended, soft, bowel sounds positive, no rebound, diffusely tender throughout entire right lower quadrant, no masses appreciable. No peritoneal signs. No external evidence of bleeding/ecchymosis or trauma. Patient has scars of previous skin graft in right lower extremity and possibly right lower abdomen. Patient was examined with a female nurse/charge nurse in the room. Extremities: No significant cyanosis, clubbing, or edema bilateral lower extremities Neurologic:  Alert and oriented 4, cranial nerves II through XII intact bilaterally, 5 over 5 strength bilateral upper and lower stream is  Data Reviewed: Basic Metabolic Panel:  Recent Labs Lab 03/01/15 1655 03/04/15 0739 03/05/15 0442 03/06/15 0456 03/07/15 0522  NA 140 134* 137 136 137  K 4.1 3.5 4.0 4.4 4.4  CL 106 101 103 100* 101  CO2 24 26 25 28 27   GLUCOSE 94 96 82 74 76  BUN 8 9 8 12 11   CREATININE 1.08* 0.72 0.71 0.68 0.66  CALCIUM 9.3 8.9 8.9 9.0 8.8*   Liver Function Tests:  Recent Labs Lab 03/01/15 1655 03/04/15 0739 03/05/15 0442 03/06/15 0456 03/07/15 0522  AST 41 18 18 15 17   ALT 15 11* 11* 10* 8*  ALKPHOS 104 55 63 62 57  BILITOT 0.6 0.6 1.3* 0.9 1.0  PROT 7.4 5.8* 6.6 6.2* 5.4*  ALBUMIN 4.0 3.0* 3.3* 3.1* 2.8*   CBC:  Recent Labs Lab 03/01/15 1655  03/04/15 2349 03/05/15 0442 03/05/15 1120 03/06/15 0456 03/07/15 0522  WBC 8.9  < > 9.1 8.9 7.8 6.7 8.0  NEUTROABS 7.5  --   --   --   --   --   --   HGB 13.3  < > 9.6* 9.7* 9.7* 9.9* 9.1*  HCT 39.4  < > 28.0* 28.1* 28.3* 28.6* 26.8*  MCV 89.5  < > 85.6 86.2 87.3 87.5 88.4  PLT 389  < > 231 220 235 191 271  < > = values in this interval not displayed.  Studies:  Recent x-ray studies have been reviewed in detail by the Attending Physician  Scheduled Meds:  Scheduled Meds: . diclofenac sodium  4 g Topical QID  . divalproex   500 mg Oral Q12H  . feeding supplement (ENSURE ENLIVE)  237 mL Oral BID BM  . folic acid  1 mg Oral Daily  . levETIRAcetam  750 mg Oral BID  . magnesium oxide  400 mg Oral BID  . metroNIDAZOLE  500 mg Oral 3 times per day  . multivitamin with minerals  1 tablet Oral Daily  . sodium chloride  3 mL Intravenous Q12H  . thiamine  100 mg Oral Daily    Time spent on care of this patient: 77 mins  Dearra Myhand, MD,  [facility] Pager [phone]  If 7PM-7AM, please contact night-coverage [email or web address] Password [email or web address] 03/07/2015, 1:47 PM   LOS: 5 days

## 2015-03-07 NOTE — Clinical Social Work Note (Signed)
Clinical Social Work Assessment  Patient Details  Name: Lindsey [name] MRN: [id number] Date of Birth: Feb 24, 1956  Date of referral:  03/07/15               Reason for consult:  Facility Placement, Discharge Planning                Permission sought to share information with:  Case Manager, Facility Sport and exercise psychologist, PCP, Family Supports Permission granted to share information::  Yes, Verbal Permission Granted  Name::      Lindsey [name] )  Agency::   (N/A)  Relationship::   (Daughter )  Contact Information:   [phone])  Housing/Transportation Living arrangements for the past 2 months:  Single Family Home Source of Information:  Adult Children Patient Interpreter Needed:  None Criminal Activity/Legal Involvement Pertinent to Current Situation/Hospitalization:  No - Comment as needed Significant Relationships:  Adult Children Lives with:  Adult Children Do you feel safe going back to the place where you live?  Yes Need for family participation in patient care:  Yes (Comment)  Care giving concerns:  No care giving concerns at this time.    Social Worker assessment / plan:  Holiday representative spoke with patient's dtr, Lindsey [name] at length in reference to post-acute placement for SNF. CSW introduced CSW role and SNF process. Pt was admitted for seizures and has a history of polysubstance abuse. Pt is from home with her daughter. Pt's dtr reported she does not want her mother in a nursing home and prefers for her to return home with her. Pt's dtr further reported her mother lives with her and she is available 24/7 to care for her mother at home. Pt's dtr asked several questions in reference to [place] and CSW explained [place] and informed dtr that home health is NOT a 24 hour service. Pt's dtr continued to decline SNF placement and would like to speak with RNCM in regards to home health. Pt further stated pt would require transportation at discharge. No further  concerns reported at this time by family.  Clinical Social Worker will sign off for now as social work intervention is no longer needed. Please consult [place] again if new need arises.  Employment status:  Unemployed Forensic scientist:  Medicaid In [place] PT Recommendations:  [place] / Referral to community resources:   (None )  Patient/Family's Response to care:  Pt's lying in bed disoriented. Pt's dtr declined SNF placement and prefers for pt to return home with home health services. Pt's dtr pleasant and appreciated social work intervention.  Patient/Family's Understanding of and Emotional Response to Diagnosis, Current Treatment, and Prognosis:  Pt's dtr understands home health will not provide 24 hour assistance and plans to care for pt at home. CSW signing off.   Emotional Assessment Appearance:  Appears stated age Attitude/Demeanor/Rapport:  Unable to Assess Affect (typically observed):  Unable to Assess Orientation:  Oriented to Self, Oriented to Place Alcohol / Substance use:  Alcohol Use, Illicit Drugs Psych involvement (Current and /or in the community):  No (Comment)  Discharge Needs  Concerns to be addressed:  Patient refuses services Readmission within the last 30 days:  Yes Current discharge risk:  Dependent with Mobility Barriers to Discharge:  Active Substance Use, Barriers Resolved   Glendon Axe, MSW, LCSWA [phone] 03/07/2015 2:06 PM

## 2015-03-08 LAB — TYPE AND SCREEN
ABO/RH(D): O POS
Antibody Screen: NEGATIVE
UNIT DIVISION: 0
UNIT DIVISION: 0
Unit division: 0
Unit division: 0

## 2015-03-08 MED ORDER — TRAMADOL HCL 50 MG PO TABS: 50.0000 mg | ORAL_TABLET | Freq: Four times a day (QID) | ORAL | Status: DC | PRN

## 2015-03-08 MED ORDER — ACETAMINOPHEN-CODEINE #3 300-30 MG PO TABS: 1.0000 | ORAL_TABLET | Freq: Three times a day (TID) | ORAL | Status: DC | PRN

## 2015-03-08 MED ORDER — ENSURE ENLIVE PO LIQD: 237.0000 mL | Freq: Two times a day (BID) | ORAL | Status: DC

## 2015-03-08 MED ORDER — METRONIDAZOLE 500 MG PO TABS: 500.0000 mg | ORAL_TABLET | Freq: Three times a day (TID) | ORAL | Status: DC

## 2015-03-08 MED ORDER — THIAMINE HCL 100 MG PO TABS: 100.0000 mg | ORAL_TABLET | Freq: Every day | ORAL | Status: DC

## 2015-03-08 NOTE — Discharge Summary (Signed)
Physician Discharge Summary  Chelci Wintermute MRN: [id number] DOB/AGE: 1956-01-07 59 y.o.  PCP: [name], MD   Admit date: 03/01/2015 Discharge date: 03/08/2015  Discharge Diagnoses:   Active Problems:   Seizure   Encephalopathy   Seizure secondary to subtherapeutic anticonvulsant medication   Post-ictal state   Head trauma   Perinephric hematoma    Follow-up recommendations Follow-up with PCP in 3-5 days , including although additional recommended appointments as below Follow-up CBC, CMP in 3-5 days      Medication List    TAKE these medications        acetaminophen 500 MG tablet  Commonly known as:  TYLENOL  Take 1,000 mg by mouth every 6 (six) hours as needed for moderate pain.     acetaminophen-codeine 300-30 MG per tablet  Commonly known as:  TYLENOL #3  Take 1 tablet by mouth every 8 (eight) hours as needed for moderate pain.     albuterol 108 (90 BASE) MCG/ACT inhaler  Commonly known as:  PROVENTIL HFA;VENTOLIN HFA  Inhale 2 puffs into the lungs every 6 (six) hours as needed for wheezing or shortness of breath.     diclofenac sodium 1 % Gel  Commonly known as:  VOLTAREN  Apply 4 g topically 4 (four) times daily.     divalproex 500 MG DR tablet  Commonly known as:  DEPAKOTE  Take 1 tablet (500 mg total) by mouth every 12 (twelve) hours.     feeding supplement (ENSURE COMPLETE) Liqd  Take 237 mLs by mouth 2 (two) times daily between meals.     feeding supplement (ENSURE ENLIVE) Liqd  Take 237 mLs by mouth 2 (two) times daily between meals.     folic acid 1 MG tablet  Commonly known as:  FOLVITE  Take 1 tablet (1 mg total) by mouth daily.     levETIRAcetam 750 MG tablet  Commonly known as:  KEPPRA  Take 1 tablet (750 mg total) by mouth 2 (two) times daily.     LORazepam 2 MG tablet  Commonly known as:  ATIVAN  Take 2 mg by mouth every 6 (six) hours as needed for anxiety.     magnesium oxide 400 (241.3 MG) MG tablet  Commonly known as:   MAG-OX  Take 1 tablet (400 mg total) by mouth 2 (two) times daily.     metroNIDAZOLE 500 MG tablet  Commonly known as:  FLAGYL  Take 1 tablet (500 mg total) by mouth 3 (three) times daily.     multivitamin with minerals Tabs tablet  Take 1 tablet by mouth daily.     thiamine 100 MG tablet  Take 1 tablet (100 mg total) by mouth daily.     traMADol 50 MG tablet  Commonly known as:  ULTRAM  Take 1 tablet (50 mg total) by mouth every 6 (six) hours as needed for moderate pain or severe pain.         Discharge Condition: Stable  Disposition: Home with the daughter, she refused SNF, home health is being set up   Consults:  Interventional radiology Urology   Significant Diagnostic Studies:  Dg Chest 1 View  03/01/2015   CLINICAL DATA:  Seizures.  Fall in bathroom.  EXAM: CHEST  1 VIEW  COMPARISON:  02/06/2015  FINDINGS: Normal cardiac silhouette. No effusion, infiltrate, or pneumothorax. No acute osseous abnormality.  IMPRESSION: No acute cardiopulmonary process.   Electronically Signed   By: Suzy Bouchard M.D.   On: 03/01/2015 18:19  Ct Head Wo Contrast  03/01/2015   CLINICAL DATA:  Acute onset seizures. Status post fall in bathroom. Hematoma over the left orbit. Concern for cervical spine injury. Initial encounter.  EXAM: CT HEAD WITHOUT CONTRAST  CT CERVICAL SPINE WITHOUT CONTRAST  TECHNIQUE: Multidetector CT imaging of the head and cervical spine was performed following the standard protocol without intravenous contrast. Multiplanar CT image reconstructions of the cervical spine were also generated.  COMPARISON:  CT of the head performed 02/06/2015, and CT of the cervical spine performed 12/29/2005  FINDINGS: CT HEAD FINDINGS  There is no evidence of acute infarction, mass lesion, or intra- or extra-axial hemorrhage on CT.  Prominence of the ventricles and sulci reflects mild cortical volume loss. Mild cerebellar atrophy is noted.  The brainstem and fourth ventricle are within  normal limits. The basal ganglia are unremarkable in appearance. The cerebral hemispheres demonstrate grossly normal gray-white differentiation. No mass effect or midline shift is seen.  There is no evidence of fracture; visualized osseous structures are unremarkable in appearance. The visualized portions of the orbits are within normal limits. The paranasal sinuses and mastoid air cells are well-aerated. Mild soft tissue swelling is noted lateral to the left orbit.  CT CERVICAL SPINE FINDINGS  There is no evidence of acute fracture or subluxation. Vertebral bodies demonstrate normal height. Chronic deformity at the right-sided facet at C5-C6 likely reflects remote traumatic injury. An associated osseous fragment extends partially into the spinal canal. This appearance is stable from 2007. There is also mild grade 1 retrolisthesis of C4 on C5. An accessory osseous fragment is noted superior to the dens, apparently new from 2007 and likely degenerative in nature. Intervertebral disc spaces are preserved. Prevertebral soft tissues are within normal limits.  A 0.9 cm hypodensity within the right thyroid lobe is likely benign, given its size. Mild emphysematous change is noted at the lung apices. No significant soft tissue abnormalities are seen.  IMPRESSION: 1. No evidence of traumatic intracranial injury or fracture. 2. No evidence of acute fracture or subluxation along the cervical spine. 3. Mild soft tissue swelling noted lateral to the left orbit. 4. Chronic deformity of the right-sided facet at C5-C6 likely reflects remote traumatic injury. 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 Mild grade 1 retrolisthesis of C4 on C5. 5. Mild cortical volume loss noted.   Electronically Signed   By: Garald Balding M.D.   On: 03/01/2015 19:41   Ct Cervical Spine Wo Contrast  03/01/2015   CLINICAL DATA:  Acute onset seizures. Status post fall in bathroom. Hematoma over the left  orbit. Concern for cervical spine injury. Initial encounter.  EXAM: CT HEAD WITHOUT CONTRAST  CT CERVICAL SPINE WITHOUT CONTRAST  TECHNIQUE: Multidetector CT imaging of the head and cervical spine was performed following the standard protocol without intravenous contrast. Multiplanar CT image reconstructions of the cervical spine were also generated.  COMPARISON:  CT of the head performed 02/06/2015, and CT of the cervical spine performed 12/29/2005  FINDINGS: CT HEAD FINDINGS  There is no evidence of acute infarction, mass lesion, or intra- or extra-axial hemorrhage on CT.  Prominence of the ventricles and sulci reflects mild cortical volume loss. Mild cerebellar atrophy is noted.  The brainstem and fourth ventricle are within normal limits. The basal ganglia are unremarkable in appearance. The cerebral hemispheres demonstrate grossly normal gray-white differentiation. No mass effect or midline shift is seen.  There is no evidence of fracture; visualized osseous structures are unremarkable  in appearance. The visualized portions of the orbits are within normal limits. The paranasal sinuses and mastoid air cells are well-aerated. Mild soft tissue swelling is noted lateral to the left orbit.  CT CERVICAL SPINE FINDINGS  There is no evidence of acute fracture or subluxation. Vertebral bodies demonstrate normal height. Chronic deformity at the right-sided facet at C5-C6 likely reflects remote traumatic injury. An associated osseous fragment extends partially into the spinal canal. This appearance is stable from 2007. There is also mild grade 1 retrolisthesis of C4 on C5. An accessory osseous fragment is noted superior to the dens, apparently new from 2007 and likely degenerative in nature. Intervertebral disc spaces are preserved. Prevertebral soft tissues are within normal limits.  A 0.9 cm hypodensity within the right thyroid lobe is likely benign, given its size. Mild emphysematous change is noted at the lung apices.  No significant soft tissue abnormalities are seen.  IMPRESSION: 1. No evidence of traumatic intracranial injury or fracture. 2. No evidence of acute fracture or subluxation along the cervical spine. 3. Mild soft tissue swelling noted lateral to the left orbit. 4. Chronic deformity of the right-sided facet at C5-C6 likely reflects remote traumatic injury. 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 Mild grade 1 retrolisthesis of C4 on C5. 5. Mild cortical volume loss noted.   Electronically Signed   By: Garald Balding M.D.   On: 03/01/2015 19:41   Mr Abdomen W Wo Contrast  03/04/2015   CLINICAL DATA:  Right lower quadrant abdominal pain. Perinephric hematoma. Question underlying mass.  EXAM: MRI ABDOMEN WITHOUT AND WITH CONTRAST  TECHNIQUE: Multiplanar multisequence MR imaging of the abdomen was performed both before and after the administration of intravenous contrast.  CONTRAST:  16m MULTIHANCE GADOBENATE DIMEGLUMINE 529 MG/ML IV SOLN  COMPARISON:  03/03/2015  FINDINGS: Despite efforts by the technologist and patient, motion artifact is present on today's exam and could not be eliminated. This reduces exam sensitivity and specificity.  Lower chest:  Small right pleural effusion with passive atelectasis.  Hepatobiliary: Flattening of the posterior margin of the right hepatic lobe due to mass-effect from the right perinephric hematoma. Cholecystectomy. No biliary dilatation.  Pancreas: Unremarkable  Spleen: Small spleen, otherwise unremarkable.  Adrenals/Urinary Tract: Arising from the right kidney and extending into the perinephric space, there is a 13.6 by 8.8 by 6.1 cm hematoma with mixed precontrast signal intensity and no discernible internal enhancement. This has a convex border along the kidney (the kidney is concave) laterally suggesting a renal or subcapsular component, but does not confined itself to the renal capsule. No associated abnormal enhancement is  identified on subtraction images to indicate underlying mass, although clearly motion artifact reduces sensitivity. No compelling evidence that this is actually an aneurysm.  There is also a Bosniak category 1 cyst of the right kidney upper pole measuring 2.9 cm in diameter, as well as a 1.4 by 1.0 cm Bosniak category 1 cyst of the right mid to lower kidney.  Just below the left renal vein, there is a 1.7 by 1.1 cm retroperitoneal fluid signal intensity which does not enhance. Conceivably this could be a small exophytic cyst arising from the kidney although definite connection to the kidney is not seen. I also do not observe a definite connection to the left ureter although they are adjacent. This could reflect a dilated lymphatic structure.  There is edema in the right perinephric space and right retroperitoneum.  Stomach/Bowel: Unremarkable  Vascular/Lymphatic: Unremarkable  Other:  No supplemental non-categorized findings.  Musculoskeletal: Suspected hemangioma eccentric to the right in the T12 vertebral bodies; similar lesion at T9.  IMPRESSION: 1. Stable appearance of a large hematoma extending from the right mid kidney into the perinephric space. This could have a subcapsular component. The possibility of a renal cyst with internal bleeding and subsequent expansion into the perirenal space is raised as a possibility, given that an underlying mass lesion is not readily apparent. Our sensitivity for smaller mass lesions is reduced due to the severity of motion artifact. There are several adjacent cysts, in the right kidney upper pole and in the right mid to lower kidney, which are Bosniak category 1. A part of the hematoma does cause concavity along the adjacent renal margin, but the kidney is not flattened in the typical fashion as if the entire hematoma was subcapsular, and much of the hematoma is in the perinephric space. 2. Edema in the perinephric space and retroperitoneum, flattening of the posterior margin  of the right hepatic lobe, and right pleural effusion may all be reactive. 3. Small left perirenal fluid collection just below the left renal vein, probably representing a dilated lymphatic structure.   Electronically Signed   By: Van Clines M.D.   On: 03/04/2015 08:23   Ct Abdomen Pelvis W Contrast  03/03/2015   CLINICAL DATA:  Abdominal pain, bulging at the emboli kiss  EXAM: CT ABDOMEN AND PELVIS WITH CONTRAST  TECHNIQUE: Multidetector CT imaging of the abdomen and pelvis was performed using the standard protocol following bolus administration of intravenous contrast.  CONTRAST:  147m OMNIPAQUE IOHEXOL 300 MG/ML  SOLN  COMPARISON:  None.  FINDINGS: Lower chest: Small right pleural effusion with compressive atelectasis.  Hepatobiliary: Normal liver without focal abnormality. Prior cholecystectomy. No intrahepatic or extrahepatic biliary ductal dilatation.  Pancreas: Normal.  Spleen: Normal.  Adrenals/Urinary Tract: Normal adrenal glands. Normal left kidney. No obstructive uropathy. Normal bladder.  8 x 9.8 x 13.4 cm right perinephric hematoma which appears to arise from the lateral interpolar aspect of the right kidney with a crescentic shaped area of renal cortex cupping the hematoma. There is hemorrhage extending inferiorly along the right psoas muscle. The hematoma displaces the kidney anteriorly and medially. There is a 3.2 cm hypodense, fluid attenuating right upper pole renal mass most consistent with a small cyst. There is a smaller cyst in the inferior pole of the right kidney.  Stomach/Bowel: No bowel dilatation or wall thickening. No extraluminal contrast. No pneumatosis, pneumoperitoneum or portal venous gas. Small amount of pelvic free fluid.  Vascular/Lymphatic: Normal caliber aorta with atherosclerosis. No abdominal or pelvic lymphadenopathy.  Reproductive: Prior hysterectomy.  Other: No focal fluid collection. 13 mm hyperdense nodule in the right anterior abdominal wall subcutaneous fat  likely representing a small hematoma versus enhancing nodule.  Musculoskeletal: Degenerative disc disease with disc height loss at L5-S1. Broad-based disc bulge at L5-S1. Bilateral facet arthropathy throughout the lumbar spine most severe at L4-5 and L5-S1. 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  IMPRESSION: 1. 8 x 9.8 x 13.4 cm right perinephric hematoma which appears to arise from the lateral interpolar aspect of the right kidney suggesting an underlying mass such as an angiomyolipoma or renal cell carcinoma versus renal trauma/laceration. Small amount of hemorrhage extending inferiorly along the right psoas muscle. Further evaluation with a MRI of the abdomen is recommended for evaluation of the kidney.  2. Small right pleural effusion. Critical Value/emergent results were called by telephone at the time  of interpretation on 03/03/2015 at 6:26 pm to Dr. Joette Catching , who verbally acknowledged these results.   Electronically Signed   By: Kathreen Devoid   On: 03/03/2015 18:28   Dg Pelvis Portable  03/01/2015   CLINICAL DATA:  The patient fell secondary to a seizure.  EXAM: PORTABLE PELVIS 1-2 VIEWS  COMPARISON:  Hip radiographs dated 03/18/2008  FINDINGS: There is no fracture or dislocation. There is slight arthritis of the left hip with marginal osteophytes on the inferior aspect of the acetabulum and on the femoral head. No joint space narrowing.  IMPRESSION: No acute abnormality.   Electronically Signed   By: Lorriane Shire M.D.   On: 03/01/2015 19:17      Filed Weights   03/02/15 0005  Weight: 61.1 kg (134 lb 11.2 oz)     Microbiology: Recent Results (from the past 240 hour(s))  Clostridium Difficile by PCR (not at [facility])     Status: Abnormal   Collection Time: 03/03/15  1:12 PM  Result Value Ref Range Status   C difficile by pcr POSITIVE (A) NEGATIVE Final    Comment: CRITICAL RESULT CALLED TO, READ BACK BY AND VERIFIED WITH: KAHIN RN 15:40 03/03/15 ([email or web address])         Blood Culture    Component Value Date/Time   SDES BLOOD RIGHT HAND 01/18/2015 1548   SPECREQUEST BOTTLES DRAWN AEROBIC ONLY 3CC 01/18/2015 1548   CULT  01/18/2015 1548    NO GROWTH 5 DAYS Note: Culture results may be compromised due to an inadequate volume of blood received in culture bottles. Performed at [facility] 01/25/2015 FINAL 01/18/2015 1548      Labs: Results for orders placed or performed during the hospital encounter of 03/01/15 (from the past 48 hour(s))  CBC     Status: Abnormal   Collection Time: 03/07/15  5:22 AM  Result Value Ref Range   WBC 8.0 4.0 - 10.5 K/uL   RBC 3.03 (L) 3.87 - 5.11 MIL/uL   Hemoglobin 9.1 (L) 12.0 - 15.0 g/dL   HCT 26.8 (L) 36.0 - 46.0 %   MCV 88.4 78.0 - 100.0 fL   MCH 30.0 26.0 - 34.0 pg   MCHC 34.0 30.0 - 36.0 g/dL   RDW 15.1 11.5 - 15.5 %   Platelets 271 150 - 400 K/uL  Comprehensive metabolic panel     Status: Abnormal   Collection Time: 03/07/15  5:22 AM  Result Value Ref Range   Sodium 137 135 - 145 mmol/L   Potassium 4.4 3.5 - 5.1 mmol/L   Chloride 101 101 - 111 mmol/L   CO2 27 22 - 32 mmol/L   Glucose, Bld 76 65 - 99 mg/dL   BUN 11 6 - 20 mg/dL   Creatinine, Ser 0.66 0.44 - 1.00 mg/dL   Calcium 8.8 (L) 8.9 - 10.3 mg/dL   Total Protein 5.4 (L) 6.5 - 8.1 g/dL   Albumin 2.8 (L) 3.5 - 5.0 g/dL   AST 17 15 - 41 U/L   ALT 8 (L) 14 - 54 U/L   Alkaline Phosphatase 57 38 - 126 U/L   Total Bilirubin 1.0 0.3 - 1.2 mg/dL   GFR calc non Af Amer >60 >60 mL/min   GFR calc Af Amer >60 >60 mL/min    Comment: (NOTE) The eGFR has been calculated using the CKD EPI equation. This calculation has not been validated in all clinical situations. eGFR's persistently <60 mL/min signify possible Chronic Kidney  Disease.    Anion gap 9 5 - 15     Lipid Panel  No results found for: CHOL, TRIG, HDL, CHOLHDL, VLDL, LDLCALC, LDLDIRECT   No results found for: HGBA1C   Lab Results  Component Value Date    CREATININE 0.66 03/07/2015     HPI :y.o. female with a history of seizures and polysubstance abuse as well as poor compliance with taking anticonvulsive medications, presenting with recurrent generalized seizures. Patient has had witnessed seizures today including a generalized seizure in the emergency room. She was hospitalized on 02/07/2015 with a very similar presentation. He has been prescribed Depakote and Keppra. On previous admission Depakote level was low. He was discharged on Depakote 500 mg twice a day and Keppra 750 mg twice a day. Family indicates that she may not have been compliant start with taking medications. She was given Ativan 2 mg 2 and the ED. Keppra 1000 mg IV was ordered. Patient subsequently found to have a large right perinephric bleed, without any evidence of extravasation or active bleeding with a drop in hemoglobin from 13.3-6.4, requiring blood transfusion during this hospitalization.    HOSPITAL COURSE: Seizure  -Multifactorial to include noncompliance with seizure medication and substance abuse -Continue Keppra 750 mg BID  -Continue valproate 500 mg BID - No further seizures reported.  Right perinephric hematoma 6/6 discovered to have large right perinephric hematoma on CT and then MRI of abdomen. - This may be secondary to fall with seizure at home on 6/4. - Urology consulted and discussed with Dr. Louis Meckel: Recommended conservative treatment initially including bedrest, Foley catheter, nothing by mouth status in case intervention is warranted,  Hemoglobin has been stable . If she decompensates or hemoglobin drops , may need embolization by IR-consulted. - As per IR, if patient able to discharge without procedure, would need to follow up in IR clinic with Dr. Earleen Newport 2-4 weeks post discharge for discussion of renal arteriogram evaluation for source of bleed and possible treatments. - Pain control improved after being started on oxycodone . However given history  of drug abuse, the patient is being discharged on tramadol and Tylenol with Codeine, would not provide any further refills in the outpatient setting. -. Serial Hemoglobin is stable    Acute posthemorrhagic anemia - Secondary to right perinephric hematoma -Status post 2 units of PRBCs on 6/7 Hemoglobin stable around 9 Check CBC in 3-5 days  C. difficile colitis - Continue metronidazole and complete 14 days' treatment, completes in another 7 days  C-spine / head trauma -CT C-spine shows no acute traumatic fractures, DC c-collar  Encephalopathy -Post-ictal state - appears to have returned to her baseline mental status -Seizure precautions   Polysubstance abuse -UDS positive for marijuana and cocaine - patient admits to use of marijuana but denies chronic cocaine abuse - she has been counseled directly and clearly about the absolute need to avoid all substances of abuse and voices understanding of same -Negative for alcohol Patient declined SNF, daughter wants to take her mother home and therefore the patient is being discharged home with her Home health is being arranged    Code Status: FULL    Discharge Exam:    Blood pressure 144/75, pulse 88, temperature 97.8 F (36.6 C), temperature source Oral, resp. rate 20, height 5' 8"  (1.727 m), weight 61.1 kg (134 lb 11.2 oz), SpO2 100 %.  General: No acute respiratory distress Lungs: Clear to auscultation bilaterally without wheezes or crackles Cardiovascular: Regular rate and rhythm without murmur gallop  or rub normal S1 and S2 Abdomen: Nondistended, soft, bowel sounds positive, no rebound, diffusely tender throughout entire right lower quadrant, no masses appreciable. No peritoneal signs. No external evidence of bleeding/ecchymosis or trauma. Patient has scars of previous skin graft in right lower extremity and possibly right lower abdomen. Patient was examined with a female nurse/charge nurse in the room. Extremities: No  significant cyanosis, clubbing, or edema bilateral lower extremities Neurologic: Alert and oriented 4, cranial nerves II through XII intact bilaterally, 5 over 5 strength bilateral upper and lower stream is       Discharge Instructions    Diet - low sodium heart healthy    Complete by:  As directed      Increase activity slowly    Complete by:  As directed            Follow-up Information    Follow up with Ardis Hughs, MD In 3 months.   Specialty:  Urology   Why:  with repeat CT scan to evaluate kidney   Contact information:   [place] [id number] [phone]       Follow up with [name], MD. Schedule an appointment as soon as possible for a visit in 1 week.   Specialty:  Family Medicine   Contact information:   [address] [place] [address] [phone]       Follow up with WAGNER, JAIME, DO. Schedule an appointment as soon as possible for a visit in 1 week.   Specialty:  Interventional Radiology   Why:  Post hospital follow-up, please call clinic to set up appointment   Contact information:   [address] [place] [address] [phone]       Signed: Reyne Dumas 03/08/2015, 11:23 AM        Time spent >45 mins

## 2015-03-08 NOTE — Care Management Note (Signed)
Case Management Note  Patient Details  Name: Lindsey [name] MRN: [id number] Date of Birth: 1956/07/31  Subjective/Objective:                   Recurrent seizures Action/Plan: Discharge planning  Expected Discharge Date:  03/08/15               Expected Discharge Plan:  [place]  In-House Referral:     Discharge planning Services  CM Consult  Post Acute Care Choice:    Choice offered to:  Adult Children  DME Arranged:    DME Agency:  [facility] Arranged:  RN, Nurse's Aide [place] Agency:  [facility]  Status of Service:  Completed, signed off  Medicare Important Message Given:    Date Medicare IM Given:    Medicare IM give by:    Date Additional Medicare IM Given:    Additional Medicare Important Message give by:     If discussed at [place] of Stay Meetings, dates discussed:    Additional Comments: CM spoke with pt and explained Medicaid would only cover the HHRN/AIDE and pt does NOT wish to pay out of pocket.  Pt will be staying with her daughter, Lindsey [name] and asked I call Lindsey [name] to discuss [place].  CM called Lindsey [name] and Lindsey [name] states the facesheet address and contact is her's as her mother lives with her. Lindsey [name] agrees to refuse HHPT/OT but accepts HHRN/Aide from [facility].  Referral called to [facility] rep, Lindsey [name].  No other CM needs were communicated. Dellie Catholic, RN 03/08/2015, 2:51 PM

## 2015-03-08 NOTE — Progress Notes (Signed)
OT Cancellation Note  Patient Details Name: Lindsey [name] MRN: [id number] DOB: [birth year]   Cancelled Treatment:    Reason Eval/Treat Not Completed: Other (comment), pt. Declined stating she was "trying to eat breakfast".  Became notably agitated and was resistive to further review of attempted tx. session  Janice Coffin, COTA/L 03/08/2015, 8:58 AM

## 2015-03-08 NOTE — Progress Notes (Signed)
Pt is being discharged home with home health.

## 2015-03-18 ENCOUNTER — Emergency Department (HOSPITAL_COMMUNITY)
Admission: EM | Admit: 2015-03-18 | Discharge: 2015-03-18 | Disposition: A | Discharge status: Home / Self Care | Payer: Medicaid Other | Attending: Emergency Medicine | Admitting: Emergency Medicine

## 2015-03-18 ENCOUNTER — Encounter (HOSPITAL_COMMUNITY): Payer: Self-pay | Admitting: Emergency Medicine

## 2015-03-18 DIAGNOSIS — R1011 Right upper quadrant pain: Secondary | ICD-10-CM | POA: Insufficient documentation

## 2015-03-18 DIAGNOSIS — G40909 Epilepsy, unspecified, not intractable, without status epilepticus: Secondary | ICD-10-CM | POA: Diagnosis not present

## 2015-03-18 DIAGNOSIS — R569 Unspecified convulsions: Secondary | ICD-10-CM | POA: Diagnosis present

## 2015-03-18 DIAGNOSIS — Z792 Long term (current) use of antibiotics: Secondary | ICD-10-CM | POA: Insufficient documentation

## 2015-03-18 DIAGNOSIS — J45909 Unspecified asthma, uncomplicated: Secondary | ICD-10-CM | POA: Insufficient documentation

## 2015-03-18 DIAGNOSIS — R400 Somnolence: Secondary | ICD-10-CM | POA: Diagnosis not present

## 2015-03-18 DIAGNOSIS — Z79899 Other long term (current) drug therapy: Secondary | ICD-10-CM | POA: Diagnosis not present

## 2015-03-18 DIAGNOSIS — R410 Disorientation, unspecified: Secondary | ICD-10-CM | POA: Insufficient documentation

## 2015-03-18 LAB — CBC WITH DIFFERENTIAL/PLATELET
BASOS ABS: 0 10*3/uL (ref 0.0–0.1)
BASOS PCT: 1 % (ref 0–1)
EOS ABS: 0.1 10*3/uL (ref 0.0–0.7)
Eosinophils Relative: 2 % (ref 0–5)
HCT: 30.8 % — ABNORMAL LOW (ref 36.0–46.0)
Hemoglobin: 10.2 g/dL — ABNORMAL LOW (ref 12.0–15.0)
Lymphocytes Relative: 22 % (ref 12–46)
Lymphs Abs: 1.5 10*3/uL (ref 0.7–4.0)
MCH: 30.3 pg (ref 26.0–34.0)
MCHC: 33.1 g/dL (ref 30.0–36.0)
MCV: 91.4 fL (ref 78.0–100.0)
Monocytes Absolute: 0.8 10*3/uL (ref 0.1–1.0)
Monocytes Relative: 12 % (ref 3–12)
NEUTROS PCT: 63 % (ref 43–77)
Neutro Abs: 4.3 10*3/uL (ref 1.7–7.7)
PLATELETS: 558 10*3/uL — AB (ref 150–400)
RBC: 3.37 MIL/uL — ABNORMAL LOW (ref 3.87–5.11)
RDW: 14.8 % (ref 11.5–15.5)
WBC: 6.6 10*3/uL (ref 4.0–10.5)

## 2015-03-18 LAB — COMPREHENSIVE METABOLIC PANEL
ALBUMIN: 3.1 g/dL — AB (ref 3.5–5.0)
ALK PHOS: 68 U/L (ref 38–126)
ALT: 10 U/L — ABNORMAL LOW (ref 14–54)
AST: 16 U/L (ref 15–41)
Anion gap: 6 (ref 5–15)
BILIRUBIN TOTAL: 0.5 mg/dL (ref 0.3–1.2)
BUN: 7 mg/dL (ref 6–20)
CHLORIDE: 106 mmol/L (ref 101–111)
CO2: 25 mmol/L (ref 22–32)
Calcium: 9 mg/dL (ref 8.9–10.3)
Creatinine, Ser: 0.86 mg/dL (ref 0.44–1.00)
GFR calc Af Amer: >60 mL/min (ref >60)
GFR calc non Af Amer: >60 mL/min (ref >60)
Glucose, Bld: 114 mg/dL — ABNORMAL HIGH (ref 65–99)
Potassium: 3.6 mmol/L (ref 3.5–5.1)
SODIUM: 137 mmol/L (ref 135–145)
TOTAL PROTEIN: 6.2 g/dL — AB (ref 6.5–8.1)

## 2015-03-18 LAB — VALPROIC ACID LEVEL: Valproic Acid Lvl: 21 ug/mL — ABNORMAL LOW (ref 50.0–100.0)

## 2015-03-18 MED ORDER — ONDANSETRON HCL 4 MG/2ML IJ SOLN
4.0000 mg | Freq: Once | INTRAMUSCULAR | Status: AC
  Administered 2015-03-18: 4 mg via INTRAVENOUS
  Filled 2015-03-18: qty 2

## 2015-03-18 MED ORDER — VALPROATE SODIUM 500 MG/5ML IV SOLN
500.0000 mg | Freq: Once | INTRAVENOUS | Status: AC
  Administered 2015-03-18 (×2): 500 mg via INTRAVENOUS
  Filled 2015-03-18: qty 5

## 2015-03-18 MED ORDER — LORAZEPAM 2 MG/ML IJ SOLN
1.0000 mg | Freq: Once | INTRAMUSCULAR | Status: AC
  Administered 2015-03-18: 1 mg via INTRAVENOUS

## 2015-03-18 MED ORDER — LORAZEPAM 2 MG/ML IJ SOLN
INTRAMUSCULAR | Status: AC
  Filled 2015-03-18: qty 1

## 2015-03-18 MED ORDER — SODIUM CHLORIDE 0.9 % IV SOLN
1000.0000 mg | Freq: Once | INTRAVENOUS | Status: AC
  Administered 2015-03-18: 1000 mg via INTRAVENOUS
  Filled 2015-03-18: qty 10

## 2015-03-18 NOTE — ED Notes (Signed)
Patient was ambulated by the tech who reported that patient ambulated but had a slightly unsteady gait.

## 2015-03-18 NOTE — ED Notes (Signed)
Patient being discharged with cab voucher from [employer], [employer].  SW also working on long term medical transportation for patient.  Patient in hallway for now, will move to lobby to wait for cab when she is finished.

## 2015-03-18 NOTE — Discharge Instructions (Signed)

## 2015-03-18 NOTE — ED Notes (Signed)
Pt placed in ED lobby with warm blankets to await cab.  I called the number listed in the patient's chart and spoke with Beverlee Nims, who is related to patient and informed her to expect the patient soon as she will be arriving by cab.

## 2015-03-18 NOTE — ED Notes (Signed)
Patient arrived with EMS from home , witnessed grand mal seizure at home by family this evening approx. 5 mins. , post ictal at EMS arrival at home , CBG = 93 , saline lock at left hand 22g. Alert and oriented at arrival.

## 2015-03-18 NOTE — ED Provider Notes (Signed)
CSN: [id number]     Arrival date & time 03/18/15  8469 History   First MD Initiated Contact with Patient 03/18/15 0334     This chart was scribed for Lindsey Rice, MD by Forrestine Him, ED Scribe. This patient was seen in room A12C/A12C and the patient's care was started 3:36 AM.   Chief Complaint  Patient presents with  . Seizures   The history is provided by the patient. No language interpreter was used.    HPI Comments: Lindsey [name] brought in by EMS from home is a 59 y.o. female with a PMHx of seizures who presents to the Emergency Department here after a grand mal witnessed seizure this evening. Seizure activity lasted for approximately 5 minutes. Post ictal at EMS arrival to home. She now c/o constant, ongoing generalized pain but does not specify. No recent fever or chills. [name] takes Depakote and Keppra daily for history of seizures. Denies any misses doses. Pt with known allergies to Penicillins and Morphine.   Past Medical History  Diagnosis Date  . Seizures   . Asthma    Past Surgical History  Procedure Laterality Date  . Abdominal hysterectomy     No family history on file. History  Substance Use Topics  . Smoking status: Never Smoker   . Smokeless tobacco: Never Used  . Alcohol Use: Yes     Comment: drinks heavily. Agrees with drinking every other day.    OB History    No data available     Review of Systems  Constitutional: Negative for fever and chills.  Respiratory: Negative for cough and shortness of breath.   Cardiovascular: Negative for chest pain.  Gastrointestinal: Positive for abdominal pain. Negative for nausea and vomiting.  Musculoskeletal: Negative for back pain.  Skin: Negative for rash and wound.  Neurological: Positive for seizures. Negative for dizziness, weakness and headaches.  All other systems reviewed and are negative.     Allergies  Morphine and related and Penicillins  Home Medications   Prior to Admission medications    Medication Sig Start Date End Date Taking? Authorizing Provider  acetaminophen (TYLENOL) 500 MG tablet Take 1,000 mg by mouth every 6 (six) hours as needed for moderate pain.     Historical Provider, MD  acetaminophen-codeine (TYLENOL #3) 300-30 MG per tablet Take 1 tablet by mouth every 8 (eight) hours as needed for moderate pain. 03/08/15   Reyne Dumas, MD  albuterol (PROVENTIL HFA;VENTOLIN HFA) 108 (90 BASE) MCG/ACT inhaler Inhale 2 puffs into the lungs every 6 (six) hours as needed for wheezing or shortness of breath. 01/19/15   Shanker Kristeen Mans, MD  diclofenac sodium (VOLTAREN) 1 % GEL Apply 4 g topically 4 (four) times daily.    Historical Provider, MD  divalproex (DEPAKOTE) 500 MG DR tablet Take 1 tablet (500 mg total) by mouth every 12 (twelve) hours. 02/07/15   Belkys A Regalado, MD  feeding supplement, ENSURE COMPLETE, (ENSURE COMPLETE) LIQD Take 237 mLs by mouth 2 (two) times daily between meals. 01/19/15   Shanker Kristeen Mans, MD  feeding supplement, ENSURE ENLIVE, (ENSURE ENLIVE) LIQD Take 237 mLs by mouth 2 (two) times daily between meals. 03/08/15   Reyne Dumas, MD  folic acid (FOLVITE) 1 MG tablet Take 1 tablet (1 mg total) by mouth daily. 01/19/15   Shanker Kristeen Mans, MD  levETIRAcetam (KEPPRA) 750 MG tablet Take 1 tablet (750 mg total) by mouth 2 (two) times daily. 02/07/15   Elmarie Shiley, MD  LORazepam (  ATIVAN) 2 MG tablet Take 2 mg by mouth every 6 (six) hours as needed for anxiety.    Historical Provider, MD  magnesium oxide (MAG-OX) 400 (241.3 MG) MG tablet Take 1 tablet (400 mg total) by mouth 2 (two) times daily. 02/07/15   Belkys A Regalado, MD  metroNIDAZOLE (FLAGYL) 500 MG tablet Take 1 tablet (500 mg total) by mouth 3 (three) times daily. 03/08/15   Reyne Dumas, MD  Multiple Vitamin (MULTIVITAMIN WITH MINERALS) TABS tablet Take 1 tablet by mouth daily.    Historical Provider, MD  thiamine 100 MG tablet Take 1 tablet (100 mg total) by mouth daily. 03/08/15   Reyne Dumas, MD   traMADol (ULTRAM) 50 MG tablet Take 1 tablet (50 mg total) by mouth every 6 (six) hours as needed for moderate pain or severe pain. 03/08/15   Reyne Dumas, MD   Triage Vitals: BP 112/60 mmHg  Pulse 75  Temp(Src) 97.8 F (36.6 C) (Oral)  Resp 21  SpO2 99%   Physical Exam  Constitutional: She appears well-developed and well-nourished. No distress.  Drowsy and confused  HENT:  Head: Normocephalic and atraumatic.  Mouth/Throat: Oropharynx is clear and moist. No oropharyngeal exudate.  No intraoral trauma  Eyes: EOM are normal. Pupils are equal, round, and reactive to light.  Pinpoint pupils bilaterally.  Neck: Normal range of motion. Neck supple.  No posterior midline cervical tenderness to palpation. No meningismus.  Cardiovascular: Normal rate and regular rhythm.   Pulmonary/Chest: Effort normal and breath sounds normal. No respiratory distress. She has no wheezes. She has no rales. She exhibits no tenderness.  Abdominal: Soft. Bowel sounds are normal. She exhibits no distension and no mass. There is tenderness (tenderness to palpation in the right upper quadrant.). There is no rebound and no guarding.  Musculoskeletal: Normal range of motion. She exhibits no edema or tenderness.  No midline thoracic or lumbar tenderness to palpation.  Neurological:  Drowsy and confused. Patient answering some questions. Follow simple commands. Moves all extremities without obvious deficit. Sensation is grossly intact.  Skin: Skin is warm and dry. No rash noted. No erythema.  Psychiatric: She has a normal mood and affect. Her behavior is normal.  Nursing note and vitals reviewed.   ED Course  Procedures (including critical care time)  DIAGNOSTIC STUDIES: Oxygen Saturation is 98% on RA, Normal by my interpretation.    COORDINATION OF CARE: 3:36 AM- Will order CBC, CMP, EKG, and valproic acid level. Discussed treatment plan with pt at bedside and pt agreed to plan.     Labs Review Labs  Reviewed  CBC WITH DIFFERENTIAL/PLATELET - Abnormal; Notable for the following:    RBC 3.37 (*)    Hemoglobin 10.2 (*)    HCT 30.8 (*)    Platelets 558 (*)    All other components within normal limits  COMPREHENSIVE METABOLIC PANEL - Abnormal; Notable for the following:    Glucose, Bld 114 (*)    Total Protein 6.2 (*)    Albumin 3.1 (*)    ALT 10 (*)    All other components within normal limits  VALPROIC ACID LEVEL - Abnormal; Notable for the following:    Valproic Acid Lvl 21 (*)    All other components within normal limits    Imaging Review No results found.   EKG Interpretation None      MDM   Final diagnoses:  Recurrent seizures  Post-ictal state    I personally performed the services described in this documentation, which  was scribed in my presence. The recorded information has been reviewed and is accurate.    Patient had witnessed tonic-clonic seizure-like activity in the emergency department lasting roughly 2-3 minutes. Resolved with Ativan. The time patient's been postictal and very drowsy. Signed out to the oncoming emergency physician pending reassessment for return to baseline mental status. If she returns to her baseline mental status I believe she can be discharged home to follow-up with her neurologist as an outpatient otherwise she will have to be admitted to the hospital.  Lindsey Rice, MD 03/18/15 2345

## 2015-03-18 NOTE — ED Provider Notes (Signed)
Pt more awake, is alert, complains of nausea. Will given zofran, PO challenge, then ambulate.   Pt ambulated, slightly unsteady, but in NAD.   1. Recurrent seizures   2. Post-ictal state      Ernestina Patches, MD 03/18/15 1148

## 2015-04-19 ENCOUNTER — Emergency Department (HOSPITAL_COMMUNITY)
Admission: EM | Admit: 2015-04-19 | Discharge: 2015-04-20 | Disposition: A | Discharge status: Home / Self Care | Payer: Medicaid Other | Attending: Emergency Medicine | Admitting: Emergency Medicine

## 2015-04-19 ENCOUNTER — Emergency Department (HOSPITAL_COMMUNITY): Payer: Medicaid Other

## 2015-04-19 DIAGNOSIS — Z792 Long term (current) use of antibiotics: Secondary | ICD-10-CM | POA: Diagnosis not present

## 2015-04-19 DIAGNOSIS — G40909 Epilepsy, unspecified, not intractable, without status epilepticus: Secondary | ICD-10-CM | POA: Diagnosis not present

## 2015-04-19 DIAGNOSIS — R000 Tachycardia, unspecified: Secondary | ICD-10-CM | POA: Diagnosis not present

## 2015-04-19 DIAGNOSIS — Z880 Allergy status to penicillin: Secondary | ICD-10-CM | POA: Insufficient documentation

## 2015-04-19 DIAGNOSIS — R454 Irritability and anger: Secondary | ICD-10-CM | POA: Insufficient documentation

## 2015-04-19 DIAGNOSIS — R569 Unspecified convulsions: Secondary | ICD-10-CM

## 2015-04-19 DIAGNOSIS — R451 Restlessness and agitation: Secondary | ICD-10-CM | POA: Insufficient documentation

## 2015-04-19 DIAGNOSIS — J45909 Unspecified asthma, uncomplicated: Secondary | ICD-10-CM | POA: Insufficient documentation

## 2015-04-19 DIAGNOSIS — Z9114 Patient's other noncompliance with medication regimen: Secondary | ICD-10-CM | POA: Insufficient documentation

## 2015-04-19 DIAGNOSIS — Z79899 Other long term (current) drug therapy: Secondary | ICD-10-CM | POA: Diagnosis not present

## 2015-04-19 LAB — I-STAT CHEM 8, ED
BUN: 9 mg/dL (ref 6–20)
CHLORIDE: 102 mmol/L (ref 101–111)
Calcium, Ion: 1.16 mmol/L (ref 1.12–1.23)
Creatinine, Ser: 0.7 mg/dL (ref 0.44–1.00)
GLUCOSE: 118 mg/dL — AB (ref 65–99)
HEMATOCRIT: 45 % (ref 36.0–46.0)
Hemoglobin: 15.3 g/dL — ABNORMAL HIGH (ref 12.0–15.0)
Potassium: 4.2 mmol/L (ref 3.5–5.1)
SODIUM: 138 mmol/L (ref 135–145)
TCO2: 22 mmol/L (ref 0–100)

## 2015-04-19 LAB — BASIC METABOLIC PANEL
Anion gap: 14 (ref 5–15)
BUN: 9 mg/dL (ref 6–20)
CHLORIDE: 102 mmol/L (ref 101–111)
CO2: 23 mmol/L (ref 22–32)
CREATININE: 0.78 mg/dL (ref 0.44–1.00)
Calcium: 9.2 mg/dL (ref 8.9–10.3)
GFR calc non Af Amer: >60 mL/min (ref >60)
GLUCOSE: 118 mg/dL — AB (ref 65–99)
POTASSIUM: 4.1 mmol/L (ref 3.5–5.1)
Sodium: 139 mmol/L (ref 135–145)

## 2015-04-19 LAB — CBC
HCT: 42.6 % (ref 36.0–46.0)
Hemoglobin: 13.9 g/dL (ref 12.0–15.0)
MCH: 30.4 pg (ref 26.0–34.0)
MCHC: 32.6 g/dL (ref 30.0–36.0)
MCV: 93.2 fL (ref 78.0–100.0)
PLATELETS: 436 10*3/uL — AB (ref 150–400)
RBC: 4.57 MIL/uL (ref 3.87–5.11)
RDW: 14 % (ref 11.5–15.5)
WBC: 13.1 10*3/uL — AB (ref 4.0–10.5)

## 2015-04-19 LAB — VALPROIC ACID LEVEL: VALPROIC ACID LVL: 24 ug/mL — AB (ref 50.0–100.0)

## 2015-04-19 LAB — ETHANOL

## 2015-04-19 MED ORDER — VALPROATE SODIUM 500 MG/5ML IV SOLN
1000.0000 mg | Freq: Once | INTRAVENOUS | Status: AC
  Administered 2015-04-19: 1000 mg via INTRAVENOUS
  Filled 2015-04-19: qty 10

## 2015-04-19 MED ORDER — LORAZEPAM 2 MG/ML IJ SOLN
INTRAMUSCULAR | Status: AC
  Administered 2015-04-19: 1 mg
  Filled 2015-04-19: qty 1

## 2015-04-19 MED ORDER — LORAZEPAM 2 MG/ML IJ SOLN
1.0000 mg | Freq: Once | INTRAMUSCULAR | Status: AC
  Administered 2015-04-19: 1 mg via INTRAVENOUS
  Filled 2015-04-19: qty 1

## 2015-04-19 NOTE — ED Notes (Signed)
PA at bedside with this RN. Pt uncooperative and is not willing to allow assessment.

## 2015-04-19 NOTE — ED Provider Notes (Signed)
CSN: [id number]     Arrival date & time 04/19/15  1902 History   First MD Initiated Contact with Patient 04/19/15 1917     Chief Complaint  Patient presents with  . Seizures     (Consider location/radiation/quality/duration/timing/severity/associated sxs/prior Treatment) HPI Comments: 59 year old female presenting via EMS after 2 witnessed seizures today and to yesterday. Yesterday while seizing, the patient fell and hit her head. The family was not helpful providing much information of the history to EMS other than stating she is not compliant with Depakote, and did not want to help provide information. She misses appointments with the neurologist. On EMS arrival, patient was postictal, and en route, ripped out her IV and told them not to touch her. On arrival here to the ED, patient is very agitated and not allowing any assessment, and is threatening to "hurt" staff. Denying any pain. Denies injury, HA, dizziness, lightheadedness, confusion, CP, SOB.  Patient is a 59 y.o. female presenting with seizures. The history is provided by the EMS personnel. The history is limited by the condition of the patient.  Seizures   Past Medical History  Diagnosis Date  . Seizures   . Asthma    Past Surgical History  Procedure Laterality Date  . Abdominal hysterectomy     No family history on file. History  Substance Use Topics  . Smoking status: Never Smoker   . Smokeless tobacco: Never Used  . Alcohol Use: Yes     Comment: drinks heavily. Agrees with drinking every other day.    OB History    No data available     Review of Systems  Neurological: Positive for seizures.  All other systems reviewed and are negative.     Allergies  Morphine and related and Penicillins  Home Medications   Prior to Admission medications   Medication Sig Start Date End Date Taking? Authorizing Provider  acetaminophen (TYLENOL) 500 MG tablet Take 1,000 mg by mouth every 6 (six) hours as needed for  moderate pain.    Yes Historical Provider, MD  acetaminophen-codeine (TYLENOL #3) 300-30 MG per tablet Take 1 tablet by mouth every 8 (eight) hours as needed for moderate pain. 03/08/15  Yes Reyne Dumas, MD  albuterol (PROVENTIL HFA;VENTOLIN HFA) 108 (90 BASE) MCG/ACT inhaler Inhale 2 puffs into the lungs every 6 (six) hours as needed for wheezing or shortness of breath. 01/19/15  Yes Shanker Kristeen Mans, MD  diclofenac sodium (VOLTAREN) 1 % GEL Apply 4 g topically 4 (four) times daily as needed (pain).    Yes Historical Provider, MD  divalproex (DEPAKOTE) 500 MG DR tablet Take 1 tablet (500 mg total) by mouth every 12 (twelve) hours. 02/07/15  Yes Belkys A Regalado, MD  feeding supplement, ENSURE COMPLETE, (ENSURE COMPLETE) LIQD Take 237 mLs by mouth 2 (two) times daily between meals. 01/19/15  Yes Shanker Kristeen Mans, MD  folic acid (FOLVITE) 1 MG tablet Take 1 tablet (1 mg total) by mouth daily. 01/19/15  Yes Shanker Kristeen Mans, MD  levETIRAcetam (KEPPRA) 750 MG tablet Take 1 tablet (750 mg total) by mouth 2 (two) times daily. 02/07/15  Yes Belkys A Regalado, MD  LORazepam (ATIVAN) 2 MG tablet Take 2 mg by mouth every 6 (six) hours as needed for anxiety.   Yes Historical Provider, MD  magnesium oxide (MAG-OX) 400 (241.3 MG) MG tablet Take 1 tablet (400 mg total) by mouth 2 (two) times daily. 02/07/15  Yes Belkys A Regalado, MD  Multiple Vitamin (MULTIVITAMIN WITH MINERALS) TABS  tablet Take 1 tablet by mouth daily.   Yes Historical Provider, MD  thiamine 100 MG tablet Take 1 tablet (100 mg total) by mouth daily. 03/08/15  Yes Reyne Dumas, MD  traMADol (ULTRAM) 50 MG tablet Take 1 tablet (50 mg total) by mouth every 6 (six) hours as needed for moderate pain or severe pain. 03/08/15  Yes Reyne Dumas, MD  divalproex (DEPAKOTE) 500 MG DR tablet Take 1 tablet (500 mg total) by mouth every 12 (twelve) hours. 04/20/15   Carman Ching, PA-C  feeding supplement, ENSURE ENLIVE, (ENSURE ENLIVE) LIQD Take 237 mLs by mouth 2  (two) times daily between meals. 03/08/15   Reyne Dumas, MD  metroNIDAZOLE (FLAGYL) 500 MG tablet Take 1 tablet (500 mg total) by mouth 3 (three) times daily. 03/08/15   Reyne Dumas, MD   BP 128/52 mmHg  Pulse 92  Temp(Src) 97.9 F (36.6 C) (Axillary)  Resp 18  SpO2 100% Physical Exam  Constitutional: She is oriented to person, place, and time. She appears well-developed and well-nourished. No distress.  HENT:  Head: Normocephalic and atraumatic.  Mouth/Throat: Oropharynx is clear and moist.  Eyes: Conjunctivae and EOM are normal. Pupils are equal, round, and reactive to light.  Pinpoint pupils, equal and reactive to light.  Neck: Normal range of motion. Neck supple.  Cardiovascular: Regular rhythm and normal heart sounds.  Tachycardia present.   Pulmonary/Chest: Effort normal and breath sounds normal. No respiratory distress.  Musculoskeletal: Normal range of motion. She exhibits no edema.  Neurological: She is alert and oriented to person, place, and time.  Pt refusing to cooperate.  Skin: Skin is warm and dry.  Psychiatric: Her affect is angry. She is agitated and aggressive.  Nursing note and vitals reviewed.   ED Course  Procedures (including critical care time) Labs Review Labs Reviewed  CBC - Abnormal; Notable for the following:    WBC 13.1 (*)    Platelets 436 (*)    All other components within normal limits  BASIC METABOLIC PANEL - Abnormal; Notable for the following:    Glucose, Bld 118 (*)    All other components within normal limits  VALPROIC ACID LEVEL - Abnormal; Notable for the following:    Valproic Acid Lvl 24 (*)    All other components within normal limits  I-STAT CHEM 8, ED - Abnormal; Notable for the following:    Glucose, Bld 118 (*)    Hemoglobin 15.3 (*)    All other components within normal limits  ETHANOL    Imaging Review No results found.   EKG Interpretation None      MDM   Final diagnoses:  Seizure  Non compliance w medication  regimen   Nontoxic appearing. Very agitated on arrival and would not allow any exam and would not cooperate. After initial evaluation went back to evaluate to see if the patient would cooperate, it is noted she was seizing, full body generalized seizure. 1 mg IM Ativan given and the patient became post ictal. IV started, labs pending. Another dose of IV Ativan given.  No further seizure activity in ED. Depakote level sub therapeutic. Loading dose given in ED. Labs significant for leukocytosis of 13.1, unlikely from infectious source, most likely from seizure activity. Labs otherwise without any acute finding. Pt alert and oriented, refusing to give urine sample or have head CT. Doubt intracranial injury. Moving all extremities without difficulty, speech fluent and goal oriented. Discussed importance of medication compliance with depakote. Rx given. Stable for  d/c. Return precautions given. Patient states understanding of treatment care plan and is agreeable.  Discussed with attending Dr. Tawnya Crook who also evaluated patient and agrees with plan of care.  Carman Ching, PA-C 04/20/15 0040  Ernestina Patches, MD 04/21/15 [phone]

## 2015-04-19 NOTE — ED Notes (Addendum)
Assisted patient to get cleaned up. Brief changed with NT and myself. Patient swinging arms. Refusing to allow [place] to clean her. States "ma'am I am trying to hurt you." Pt not willing to allow staff to get temperature. Per EMS pt would like allow IV.   Lights dimmed for comfort. Patient is holding head.

## 2015-04-19 NOTE — ED Notes (Addendum)
Pt from home per ems pt had two witnessed seizure today and two yesterday. Per ems per family pt hit her head yesterday while seizure. Hx seizure. Takes Depakote, and per ems pt appears to not be compliant with her meds. Also pt missed appointments with neurologists. Per ems pt is postictal, pt responds to voice yet will not answer assessment questions.

## 2015-04-19 NOTE — ED Notes (Signed)
toileting offered. Changed chucks under buttocks. Attempted set of vitals. Pt alert to say "please please."

## 2015-04-19 NOTE — ED Notes (Signed)
Bed: WA04 Expected date: 04/19/15 Expected time: 6:58 PM Means of arrival: Ambulance Comments: seizure

## 2015-04-19 NOTE — ED Notes (Signed)
Verbal order with MD and PA at bedside to start IV line in foot.

## 2015-04-20 MED ORDER — DIVALPROEX SODIUM 500 MG PO DR TAB: 500.0000 mg | DELAYED_RELEASE_TABLET | Freq: Two times a day (BID) | ORAL | Status: DC

## 2015-04-20 NOTE — Discharge Instructions (Signed)
It is very important to take your depakote as prescribed.   Seizure, Adult A seizure is abnormal electrical activity in the brain. Seizures usually last from 30 seconds to 2 minutes. There are various types of seizures. Before a seizure, you may have a warning sensation (aura) that a seizure is about to occur. An aura may include the following symptoms:   Fear or anxiety.  Nausea.  Feeling like the room is spinning (vertigo).  Vision changes, such as seeing flashing lights or spots. Common symptoms during a seizure include:  A change in attention or behavior (altered mental status).  Convulsions with rhythmic jerking movements.  Drooling.  Rapid eye movements.  Grunting.  Loss of bladder and bowel control.  Bitter taste in the mouth.  Tongue biting. After a seizure, you may feel confused and sleepy. You may also have an injury resulting from convulsions during the seizure. HOME CARE INSTRUCTIONS   If you are given medicines, take them exactly as prescribed by your health care provider.  Keep all follow-up appointments as directed by your health care provider.  Do not swim or drive or engage in risky activity during which a seizure could cause further injury to you or others until your health care provider says it is OK.  Get adequate rest.  Teach friends and family what to do if you have a seizure. They should:  Lay you on the ground to prevent a fall.  Put a cushion under your head.  Loosen any tight clothing around your neck.  Turn you on your side. If vomiting occurs, this helps keep your airway clear.  Stay with you until you recover.  Know whether or not you need emergency care. SEEK IMMEDIATE MEDICAL CARE IF:  The seizure lasts longer than 5 minutes.  The seizure is severe or you do not wake up immediately after the seizure.  You have an altered mental status after the seizure.  You are having more frequent or worsening seizures. Someone should  drive you to the emergency department or call local emergency services (911 in [place] MAKE SURE YOU:  Understand these instructions.  Will watch your condition.  Will get help right away if you are not doing well or get worse. Document Released: 09/10/2000 Document Revised: 07/04/2013 Document Reviewed: 04/25/2013 [facility] Patient Information 2015 [place], [place]. This information is not intended to replace advice given to you by your health care provider. Make sure you discuss any questions you have with your health care provider.  Epilepsy Epilepsy is a disorder in which a person has repeated seizures over time. A seizure is a release of abnormal electrical activity in the brain. Seizures can cause a change in attention, behavior, or the ability to remain awake and alert (altered mental status). Seizures often involve uncontrollable shaking (convulsions).  Most people with epilepsy lead normal lives. However, people with epilepsy are at an increased risk of falls, accidents, and injuries. Therefore, it is important to begin treatment right away. CAUSES  Epilepsy has many possible causes. Anything that disturbs the normal pattern of brain cell activity can lead to seizures. This may include:   Head injury.  Birth trauma.  High fever as a child.  Stroke.  Bleeding into or around the brain.  Certain drugs.  Prolonged low oxygen, such as what occurs after CPR efforts.  Abnormal brain development.  Certain illnesses, such as meningitis, encephalitis (brain infection), malaria, and other infections.  An imbalance of nerve signaling chemicals (neurotransmitters).  SIGNS AND SYMPTOMS  The symptoms of a seizure can vary greatly from one person to another. Right before a seizure, you may have a warning (aura) that a seizure is about to occur. An aura may include the following symptoms:  Fear or anxiety.  Nausea.  Feeling like the room is spinning (vertigo).  Vision changes, such  as seeing flashing lights or spots. Common symptoms during a seizure include:  Abnormal sensations, such as an abnormal smell or a bitter taste in the mouth.   Sudden, general body stiffness.   Convulsions that involve rhythmic jerking of the face, arm, or leg on one or both sides.   Sudden change in consciousness.   Appearing to be awake but not responding.   Appearing to be asleep but cannot be awakened.   Grimacing, chewing, lip smacking, drooling, tongue biting, or loss of bowel or bladder control. After a seizure, you may feel sleepy for a while. DIAGNOSIS  Your health care provider will ask about your symptoms and take a medical history. Descriptions from any witnesses to your seizures will be very helpful in the diagnosis. A physical exam, including a detailed neurological exam, is necessary. Various tests may be done, such as:   An electroencephalogram (EEG). This is a painless test of your brain waves. In this test, a diagram is created of your brain waves. These diagrams can be interpreted by a specialist.  An MRI of the brain.   A CT scan of the brain.   A spinal tap (lumbar puncture, LP).  Blood tests to check for signs of infection or abnormal blood chemistry. TREATMENT  There is no cure for epilepsy, but it is generally treatable. Once epilepsy is diagnosed, it is important to begin treatment as soon as possible. For most people with epilepsy, seizures can be controlled with medicines. The following may also be used:  A pacemaker for the brain (vagus nerve stimulator) can be used for people with seizures that are not well controlled by medicine.  Surgery on the brain. For some people, epilepsy eventually goes away. HOME CARE INSTRUCTIONS   Follow your health care provider's recommendations on driving and safety in normal activities.  Get enough rest. Lack of sleep can cause seizures.  Only take over-the-counter or prescription medicines as directed by  your health care provider. Take any prescribed medicine exactly as directed.  Avoid any known triggers of your seizures.  Keep a seizure diary. Record what you recall about any seizure, especially any possible trigger.   Make sure the people you live and work with know that you are prone to seizures. They should receive instructions on how to help you. In general, a witness to a seizure should:   Cushion your head and body.   Turn you on your side.   Avoid unnecessarily restraining you.   Not place anything inside your mouth.   Call for emergency medical help if there is any question about what has occurred.   Follow up with your health care provider as directed. You may need regular blood tests to monitor the levels of your medicine.  SEEK MEDICAL CARE IF:   You develop signs of infection or other illness. This might increase the risk of a seizure.   You seem to be having more frequent seizures.   Your seizure pattern is changing.  SEEK IMMEDIATE MEDICAL CARE IF:   You have a seizure that does not stop after a few moments.   You have a seizure that causes any difficulty in  breathing.   You have a seizure that results in a very severe headache.   You have a seizure that leaves you with the inability to speak or use a part of your body.  Document Released: 09/13/2005 Document Revised: 07/04/2013 Document Reviewed: 04/25/2013 [facility] Patient Information 2015 [place], [place]. This information is not intended to replace advice given to you by your health care provider. Make sure you discuss any questions you have with your health care provider.

## 2015-04-20 NOTE — ED Notes (Signed)
This RN spoke with patients daughter about d/c. Daughter will be at the residence to receive the patient.

## 2015-04-20 NOTE — ED Notes (Signed)
Pt is more alert and oriented. Able to confirm address and families name. This RN gave information about patients stay. Pt shook head and said yes ma'am when given information.

## 2015-04-25 ENCOUNTER — Encounter (HOSPITAL_COMMUNITY): Payer: Self-pay | Admitting: Emergency Medicine

## 2015-04-25 ENCOUNTER — Emergency Department (HOSPITAL_COMMUNITY)
Admission: EM | Admit: 2015-04-25 | Discharge: 2015-04-25 | Disposition: A | Discharge status: Home / Self Care | Payer: Medicaid Other | Attending: Emergency Medicine | Admitting: Emergency Medicine

## 2015-04-25 DIAGNOSIS — J45909 Unspecified asthma, uncomplicated: Secondary | ICD-10-CM | POA: Insufficient documentation

## 2015-04-25 DIAGNOSIS — R569 Unspecified convulsions: Secondary | ICD-10-CM | POA: Insufficient documentation

## 2015-04-25 LAB — CBC WITH DIFFERENTIAL/PLATELET
Basophils Absolute: 0 10*3/uL (ref 0.0–0.1)
Basophils Relative: 0 % (ref 0–1)
EOS ABS: 0.1 10*3/uL (ref 0.0–0.7)
EOS PCT: 1 % (ref 0–5)
HEMATOCRIT: 39.7 % (ref 36.0–46.0)
Hemoglobin: 13.1 g/dL (ref 12.0–15.0)
LYMPHS ABS: 2.2 10*3/uL (ref 0.7–4.0)
LYMPHS PCT: 28 % (ref 12–46)
MCH: 30.3 pg (ref 26.0–34.0)
MCHC: 33 g/dL (ref 30.0–36.0)
MCV: 91.7 fL (ref 78.0–100.0)
Monocytes Absolute: 0.7 10*3/uL (ref 0.1–1.0)
Monocytes Relative: 9 % (ref 3–12)
Neutro Abs: 4.8 10*3/uL (ref 1.7–7.7)
Neutrophils Relative %: 62 % (ref 43–77)
Platelets: 430 10*3/uL — ABNORMAL HIGH (ref 150–400)
RBC: 4.33 MIL/uL (ref 3.87–5.11)
RDW: 13.5 % (ref 11.5–15.5)
WBC: 7.8 10*3/uL (ref 4.0–10.5)

## 2015-04-25 LAB — BASIC METABOLIC PANEL
Anion gap: 8 (ref 5–15)
BUN: 13 mg/dL (ref 6–20)
CO2: 27 mmol/L (ref 22–32)
Calcium: 9.3 mg/dL (ref 8.9–10.3)
Chloride: 106 mmol/L (ref 101–111)
Creatinine, Ser: 0.72 mg/dL (ref 0.44–1.00)
GFR calc Af Amer: >60 mL/min (ref >60)
GFR calc non Af Amer: >60 mL/min (ref >60)
Glucose, Bld: 80 mg/dL (ref 65–99)
Potassium: 3.7 mmol/L (ref 3.5–5.1)
Sodium: 141 mmol/L (ref 135–145)

## 2015-04-25 LAB — VALPROIC ACID LEVEL: VALPROIC ACID LVL: 54 ug/mL (ref 50.0–100.0)

## 2015-04-25 MED ORDER — ACETAMINOPHEN 325 MG PO TABS
650.0000 mg | ORAL_TABLET | Freq: Once | ORAL | Status: AC
  Administered 2015-04-25: 650 mg via ORAL
  Filled 2015-04-25: qty 2

## 2015-04-25 NOTE — ED Provider Notes (Signed)
History   Chief Complaint  Patient presents with  . Seizures    HPI 59 year old female past history of seizures and asthma who presents ED for multiple mini seizures today. According to patient, she has had 3 total today. She has been her usual state of health all her medications as instructed. No recent changes in her medications. No trauma reported. Seizures were witnessed today by family members who said these lasted less than 23 seconds and patient returned to baseline immediately. She was talking during these. These were also described as shaking head noted movements. Patient denies any headache, neck pain, dizziness, vision changes, numbness, tingling, weakness. No other complaints at this time.  Past medical/surgical history, social history, medications, allergies and FH have been reviewed with patient and/or in documentation. Furthermore, if pt family or friend(s) present, additional historical information was obtained from them.  Past Medical History  Diagnosis Date  . Seizures   . Asthma    Past Surgical History  Procedure Laterality Date  . Abdominal hysterectomy     No family history on file. History  Substance Use Topics  . Smoking status: Never Smoker   . Smokeless tobacco: Never Used  . Alcohol Use: Yes     Comment: drinks heavily. Agrees with drinking every other day.      Review of Systems Constitutional: - F/C, -fatigue.  HENT: - congestion, -rhinorrhea, -sore throat.   Eyes: - eye pain, -visual disturbance.  Respiratory: - cough, -SOB, -hemoptysis.   Cardiovascular: - CP, -palps.  Gastrointestinal: - N/V/D, -abd pain  Genitourinary: - flank pain, -dysuria, -frequency.  Musculoskeletal: - myalgia/arthritis, -joint swelling, -gait abnormality, -back pain, -neck pain/stiffness, -leg pain/swelling.  Skin: - rash/lesion.  Neurological: - focal weakness, -lightheadedness, -dizziness, -numbness, -HA.  positive seizures All other systems reviewed and are  negative.   Physical Exam  Physical Exam  ED Triage Vitals  Enc Vitals Group     BP 04/25/15 2115 160/82 mmHg     Pulse Rate 04/25/15 2122 79     Resp 04/25/15 2115 10     Temp 04/25/15 2122 97.9 F (36.6 C)     Temp Source 04/25/15 2122 Oral     SpO2 04/25/15 2122 100 %     Weight --      Height 04/25/15 2122 5\' 8"  (1.727 m)     Head Cir --      Peak Flow --      Pain Score 04/25/15 2125 10     Pain Loc --      Pain Edu? --      Excl. in [place]? --    Constitutional: Patient is well appearing and in no acute distress Head: Normocephalic and atraumatic.  Eyes: Extraocular motion intact, no scleral icterus Mouth: MMM, OP clear Neck: Supple without meningismus, mass, or overt JVD Respiratory: No respiratory distress. Normal WOB. No w/r/g. CV: RRR, no obvious murmurs.  Pulses +2 and symmetric. Euvolemic Abdomen: Soft, NT, ND, no r/g. No mass.  MSK: Extremities are atraumatic without deformity, ROM intact Skin: Warm, dry, intact without rash Neuro: HDS, AAOx4. PERRL, EOMI, TML, face sym. CN 2-12 grossly intact. 5/5 sym, no drift, SILT, normal gait and coordination.     ED Course  Procedures   Labs Reviewed  CBC WITH DIFFERENTIAL/PLATELET - Abnormal; Notable for the following:    Platelets 430 (*)    All other components within normal limits  VALPROIC ACID LEVEL  BASIC METABOLIC PANEL   I personally reviewed and interpreted  all labs.  No orders to display   I personally viewed above image(s) which were used in my medical decision making. Formal interpretations by Radiology.  MDM: Arella Blinder is a 59 y.o. female with H&P as above who p/w CC: Multiple " mini seizures."  On arrival, patient is noted to be moving her head erratically and is conscious and speaking. There is no abnormal lower extremity movements noted. She then becomes cooperative with examination shortly after this resolves. Patient is a benign neuro exam and is as trauma. 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 Screening labs were sent and Depakote level was checked and all labs were normal. No seizure activity in ED.  Unchanged neuro exam. Patient stable for discharge.   Old records reviewed (if available). Labs and imaging reviewed personally by myself and considered in medical decision making if ordered. Clinical Impression: 1. Seizure-like activity     Disposition: Discharge  Condition: Good  I have discussed the results, Dx and Tx plan with the pt(& family if present). He/she/they expressed understanding and agree(s) with the plan. Discharge instructions discussed at great length. Strict return precautions discussed and pt &/or family have verbalized understanding of the instructions. No further questions at time of discharge.    New Prescriptions   No medications on file    Follow Up: [name], MD [place] [address] [phone]  Schedule an appointment as soon as possible for a visit in 1 week   [place] [address] [id number] [place] [phone]  If symptoms worsen   Pt seen in conjunction with Dr. Noemi Chapel, MD  Kirstie Peri, [facility] Resident - PGY-3      Kirstie Peri, MD 04/25/15 2325  Noemi Chapel, MD 04/25/15 2330

## 2015-04-25 NOTE — ED Notes (Addendum)
[facility] states pt daughter informed EMS that the pt had 3 mini seizures and that she fell but was caught by her daughter. Daughter told [facility] that she went "unresponsive" but has been alert and oriwented x4 with EMS. Pt presented to hospital with tremors. PT tearful but states its because her head hurts. VSS. 160/90 BP, 114 CBG, 82 HR. 20G RH. 10 out of 10 pain.

## 2015-04-25 NOTE — ED Notes (Signed)
Lab at bedside

## 2015-04-25 NOTE — Discharge Instructions (Signed)

## 2015-04-25 NOTE — ED Provider Notes (Signed)
The patient is a 59 year old female with a history of seizure disorder on Depakote, presents to the hospital after having several short lasted seizure-like episodes today. If this was reported by family members. The patient does not recall these events, during the evaluation the patient is having short bursts of shaking, she is awake and alert during these episodes. It appears to be myoclonic jerking. Heart and lung sounds are normal, the patient is otherwise well-appearing and after 30 minutes of observation without medications the patient is now back to normal, she is speaking normal, able to dial phone, can carry on a normal conversation and has no myoclonic jerking. Labs pending, Depakote level pending, anticipate discharge home, may need Depakote prior to discharge if subtherapeutic. The patient is in agreement with the plan.  I saw and evaluated the patient, reviewed the resident's note and I agree with the findings and plan.   Final diagnoses:  Seizure-like activity      Noemi Chapel, MD 04/25/15 2330

## 2015-05-22 ENCOUNTER — Emergency Department (HOSPITAL_COMMUNITY)
Admission: EM | Admit: 2015-05-22 | Discharge: 2015-05-22 | Disposition: A | Discharge status: Home / Self Care | Payer: Medicaid Other | Attending: Emergency Medicine | Admitting: Emergency Medicine

## 2015-05-22 ENCOUNTER — Encounter (HOSPITAL_COMMUNITY): Payer: Self-pay | Admitting: Emergency Medicine

## 2015-05-22 DIAGNOSIS — J45909 Unspecified asthma, uncomplicated: Secondary | ICD-10-CM | POA: Insufficient documentation

## 2015-05-22 DIAGNOSIS — G40909 Epilepsy, unspecified, not intractable, without status epilepticus: Secondary | ICD-10-CM | POA: Diagnosis present

## 2015-05-22 DIAGNOSIS — Z79899 Other long term (current) drug therapy: Secondary | ICD-10-CM | POA: Insufficient documentation

## 2015-05-22 DIAGNOSIS — Z880 Allergy status to penicillin: Secondary | ICD-10-CM | POA: Insufficient documentation

## 2015-05-22 LAB — CBC WITH DIFFERENTIAL/PLATELET
Basophils Absolute: 0 10*3/uL (ref 0.0–0.1)
Basophils Relative: 0 % (ref 0–1)
EOS ABS: 0.1 10*3/uL (ref 0.0–0.7)
EOS PCT: 1 % (ref 0–5)
HCT: 38.9 % (ref 36.0–46.0)
Hemoglobin: 13 g/dL (ref 12.0–15.0)
LYMPHS ABS: 2 10*3/uL (ref 0.7–4.0)
LYMPHS PCT: 30 % (ref 12–46)
MCH: 30.1 pg (ref 26.0–34.0)
MCHC: 33.4 g/dL (ref 30.0–36.0)
MCV: 90 fL (ref 78.0–100.0)
Monocytes Absolute: 0.8 10*3/uL (ref 0.1–1.0)
Monocytes Relative: 11 % (ref 3–12)
Neutro Abs: 3.9 10*3/uL (ref 1.7–7.7)
Neutrophils Relative %: 58 % (ref 43–77)
PLATELETS: 272 10*3/uL (ref 150–400)
RBC: 4.32 MIL/uL (ref 3.87–5.11)
RDW: 13.4 % (ref 11.5–15.5)
WBC: 6.8 10*3/uL (ref 4.0–10.5)

## 2015-05-22 LAB — BASIC METABOLIC PANEL
Anion gap: 4 — ABNORMAL LOW (ref 5–15)
BUN: 17 mg/dL (ref 6–20)
CALCIUM: 9.1 mg/dL (ref 8.9–10.3)
CO2: 29 mmol/L (ref 22–32)
Chloride: 104 mmol/L (ref 101–111)
Creatinine, Ser: 0.64 mg/dL (ref 0.44–1.00)
GFR calc Af Amer: >60 mL/min (ref >60)
GLUCOSE: 82 mg/dL (ref 65–99)
Potassium: 3.8 mmol/L (ref 3.5–5.1)
Sodium: 137 mmol/L (ref 135–145)

## 2015-05-22 LAB — VALPROIC ACID LEVEL: Valproic Acid Lvl: 55 ug/mL (ref 50.0–100.0)

## 2015-05-22 MED ORDER — ACETAMINOPHEN 500 MG PO TABS
1000.0000 mg | ORAL_TABLET | Freq: Once | ORAL | Status: AC
  Administered 2015-05-22: 1000 mg via ORAL
  Filled 2015-05-22: qty 2

## 2015-05-22 NOTE — ED Provider Notes (Signed)
CSN: [id number]     Arrival date & time 05/22/15  1716 History   First MD Initiated Contact with Patient 05/22/15 1719     Chief Complaint  Patient presents with  . Seizures    hx of same, x3 grand mal today while laying on her bed     (Consider location/radiation/quality/duration/timing/severity/associated sxs/prior Treatment) HPI Comments: Patient with a history of Seizure Disorder presents today after a seizure.  She is currently on Depakote.  When asked if she has been compliant with her medication she replies, "I think so."  She states that she took Depakote this morning.  According to EMS family reported three grand mal seizures.  Patient thinks that she only had one seizure.  Multiple attempts made to call the daughter that witnessed the seizure, but no answer.  Patient is unsure how long the seizure lasted.  She reports that she has a headache at this time, but no confusion.  Headache is diffuse.  She states that the seizure occurred when lying down and did not fall.  She denies any neck pain or extremity pain.  She reports that she was in her normal state of health prior to the seizure.  She denies fever, chills, nausea, vomiting, back pain aside from her chronic pain, vision changes, focal weakness, dizziness, numbness, or tingling.  She has numerous ED visits in the past for Seizures.  She has a history of non compliance with Depakote.    The history is provided by the patient.    Past Medical History  Diagnosis Date  . Seizures   . Asthma    Past Surgical History  Procedure Laterality Date  . Abdominal hysterectomy     No family history on file. Social History  Substance Use Topics  . Smoking status: Never Smoker   . Smokeless tobacco: Never Used  . Alcohol Use: Yes     Comment: drinks heavily. Agrees with drinking every other day.    OB History    No data available     Review of Systems  All other systems reviewed and are negative.     Allergies  Morphine and  related; Penicillins; and Strawberry  Home Medications   Prior to Admission medications   Medication Sig Start Date End Date Taking? Authorizing Provider  acetaminophen (TYLENOL) 500 MG tablet Take 1,000 mg by mouth every 6 (six) hours as needed for moderate pain.     Historical Provider, MD  acetaminophen-codeine (TYLENOL #3) 300-30 MG per tablet Take 1 tablet by mouth every 8 (eight) hours as needed for moderate pain. 03/08/15   Reyne Dumas, MD  albuterol (PROVENTIL HFA;VENTOLIN HFA) 108 (90 BASE) MCG/ACT inhaler Inhale 2 puffs into the lungs every 6 (six) hours as needed for wheezing or shortness of breath. 01/19/15   Shanker Kristeen Mans, MD  diclofenac sodium (VOLTAREN) 1 % GEL Apply 4 g topically 4 (four) times daily as needed (pain).     Historical Provider, MD  divalproex (DEPAKOTE) 500 MG DR tablet Take 1 tablet (500 mg total) by mouth every 12 (twelve) hours. Patient not taking: Reported on 04/25/2015 02/07/15   Belkys A Regalado, MD  divalproex (DEPAKOTE) 500 MG DR tablet Take 1 tablet (500 mg total) by mouth every 12 (twelve) hours. 04/20/15   Carman Ching, PA-C  feeding supplement, ENSURE COMPLETE, (ENSURE COMPLETE) LIQD Take 237 mLs by mouth 2 (two) times daily between meals. 01/19/15   Shanker Kristeen Mans, MD  feeding supplement, ENSURE ENLIVE, (ENSURE ENLIVE) LIQD  Take 237 mLs by mouth 2 (two) times daily between meals. Patient not taking: Reported on 04/25/2015 03/08/15   Reyne Dumas, MD  folic acid (FOLVITE) 1 MG tablet Take 1 tablet (1 mg total) by mouth daily. 01/19/15   Shanker Kristeen Mans, MD  levETIRAcetam (KEPPRA) 750 MG tablet Take 1 tablet (750 mg total) by mouth 2 (two) times daily. 02/07/15   Belkys A Regalado, MD  LORazepam (ATIVAN) 2 MG tablet Take 2 mg by mouth every 6 (six) hours as needed for anxiety.    Historical Provider, MD  magnesium oxide (MAG-OX) 400 (241.3 MG) MG tablet Take 1 tablet (400 mg total) by mouth 2 (two) times daily. 02/07/15   Belkys A Regalado, MD   metroNIDAZOLE (FLAGYL) 500 MG tablet Take 1 tablet (500 mg total) by mouth 3 (three) times daily. Patient not taking: Reported on 04/25/2015 03/08/15   Reyne Dumas, MD  Multiple Vitamin (MULTIVITAMIN WITH MINERALS) TABS tablet Take 1 tablet by mouth daily.    Historical Provider, MD  thiamine 100 MG tablet Take 1 tablet (100 mg total) by mouth daily. 03/08/15   Reyne Dumas, MD  traMADol (ULTRAM) 50 MG tablet Take 1 tablet (50 mg total) by mouth every 6 (six) hours as needed for moderate pain or severe pain. 03/08/15   Reyne Dumas, MD   BP 151/86 mmHg  Pulse 73  Temp(Src) 98.3 F (36.8 C) (Oral)  Resp 18  SpO2 100% Physical Exam  Constitutional: She appears well-developed and well-nourished.  HENT:  Head: Normocephalic and atraumatic.  Eyes: EOM are normal. Pupils are equal, round, and reactive to light.  Pinpoint pupils  Neck: Normal range of motion. Neck supple.  Cardiovascular: Normal rate, regular rhythm and normal heart sounds.   Pulmonary/Chest: Effort normal and breath sounds normal.  Musculoskeletal: Normal range of motion.       Cervical back: She exhibits normal range of motion, no tenderness, no bony tenderness, no swelling, no edema and no deformity.       Thoracic back: She exhibits normal range of motion, no tenderness, no bony tenderness, no swelling, no edema and no deformity.       Lumbar back: She exhibits normal range of motion, no tenderness, no bony tenderness, no swelling, no edema and no deformity.  Full ROM of UE and LE  Neurological: She is alert. She has normal strength. No cranial nerve deficit or sensory deficit.  Skin: Skin is warm and dry.  Psychiatric: She has a normal mood and affect.  Nursing note and vitals reviewed.   ED Course  Procedures (including critical care time) Labs Review Labs Reviewed  CBC WITH DIFFERENTIAL/PLATELET  BASIC METABOLIC PANEL  VALPROIC ACID LEVEL    Imaging Review No results found. I have personally reviewed and  evaluated these images and lab results as part of my medical decision-making.   EKG Interpretation None     7:23 PM Patient ambulated in the ED with a walker without difficulty.  She uses a cane at baseline.  She is back to baseline.   MDM   Final diagnoses:  None   Patient with known history of Seizure Disorder presents today after a seizure.  Patient is unclear of all the details of the Seizure.  Multiple attempts made to reach her daughter who witnessed the seizure, but she did not answer.   No signs of head trauma on exam.  Normal neurological exam.  Full ROM of all extremities.  No spinal tenderness.  No signs of  infection.  Labs unremarkable.  Depakote level WNL.  Patient is baseline at time of discharge and ambulatory in the ED.  Feel that the patient is stable for discharge.  Instructed to follow up with Neurology.  Return precautions given.    Hyman Bible, PA-C 05/22/15 [place], PA-C 05/22/15 [id number]  Orlie Dakin, MD 05/22/15 [phone]

## 2015-05-22 NOTE — ED Notes (Addendum)
Pt ambulated with walker and nurse tech, per EDPA request.

## 2015-05-22 NOTE — ED Provider Notes (Signed)
Patient had had seizure today. Has only complains of headache, diffuse, which she typically gets after a seizure. She denies noncompliance with medications. Patient is alert Glasgow Coma Score 15 moves all extremity as well no distress  Orlie Dakin, MD 05/22/15 1827

## 2015-05-22 NOTE — ED Notes (Signed)
Pt ambulated 30 feet and showed no signs of distress.  HR72 and O2 at 100%.

## 2015-05-22 NOTE — ED Notes (Signed)
Seizure pads placed at 17:25

## 2015-05-22 NOTE — ED Notes (Signed)
Bed: WA12 Expected date:  Expected time:  Means of arrival:  Comments: Ems : seizure 

## 2015-05-22 NOTE — ED Notes (Signed)
Patient is alert and oriented x3.  She was given DC instructions and follow up visit instructions.  Patient gave verbal understanding. She was DC via stretcher to daughters home.  V/S stable.  He was not showing any signs of distress on DC

## 2015-05-22 NOTE — Discharge Instructions (Signed)

## 2015-05-22 NOTE — ED Notes (Signed)
PER EMS - pt from home with c/o grand mal sz x3 today, hx of same, reports compliant with her medications, last dose 0900 this AM.  Sz occurred while laying in bed, no injury.  Pt c/o HA and feeling tired, typical of her sz, family wants her checked out.

## 2015-05-28 ENCOUNTER — Other Ambulatory Visit: Payer: Self-pay | Admitting: Interventional Radiology

## 2015-05-28 DIAGNOSIS — S37019A Minor contusion of unspecified kidney, initial encounter: Secondary | ICD-10-CM

## 2015-05-31 ENCOUNTER — Encounter (HOSPITAL_COMMUNITY): Payer: Self-pay | Admitting: Emergency Medicine

## 2015-05-31 ENCOUNTER — Emergency Department (HOSPITAL_COMMUNITY): Payer: Medicaid Other

## 2015-05-31 ENCOUNTER — Emergency Department (HOSPITAL_COMMUNITY)
Admission: EM | Admit: 2015-05-31 | Discharge: 2015-06-01 | Disposition: A | Discharge status: Home / Self Care | Payer: Medicaid Other | Attending: Emergency Medicine | Admitting: Emergency Medicine

## 2015-05-31 DIAGNOSIS — Z79899 Other long term (current) drug therapy: Secondary | ICD-10-CM | POA: Diagnosis not present

## 2015-05-31 DIAGNOSIS — R220 Localized swelling, mass and lump, head: Secondary | ICD-10-CM | POA: Diagnosis not present

## 2015-05-31 DIAGNOSIS — Z880 Allergy status to penicillin: Secondary | ICD-10-CM | POA: Insufficient documentation

## 2015-05-31 DIAGNOSIS — R110 Nausea: Secondary | ICD-10-CM | POA: Diagnosis not present

## 2015-05-31 DIAGNOSIS — G40909 Epilepsy, unspecified, not intractable, without status epilepticus: Secondary | ICD-10-CM | POA: Insufficient documentation

## 2015-05-31 DIAGNOSIS — J45909 Unspecified asthma, uncomplicated: Secondary | ICD-10-CM | POA: Insufficient documentation

## 2015-05-31 DIAGNOSIS — R569 Unspecified convulsions: Secondary | ICD-10-CM

## 2015-05-31 LAB — BASIC METABOLIC PANEL
Anion gap: 8 (ref 5–15)
BUN: 14 mg/dL (ref 6–20)
CALCIUM: 8.8 mg/dL — AB (ref 8.9–10.3)
CHLORIDE: 107 mmol/L (ref 101–111)
CO2: 24 mmol/L (ref 22–32)
CREATININE: 0.93 mg/dL (ref 0.44–1.00)
GFR calc Af Amer: >60 mL/min (ref >60)
GFR calc non Af Amer: >60 mL/min (ref >60)
Glucose, Bld: 93 mg/dL (ref 65–99)
Potassium: 3.8 mmol/L (ref 3.5–5.1)
Sodium: 139 mmol/L (ref 135–145)

## 2015-05-31 LAB — CBC
HCT: 40.6 % (ref 36.0–46.0)
HEMOGLOBIN: 13.5 g/dL (ref 12.0–15.0)
MCH: 30.2 pg (ref 26.0–34.0)
MCHC: 33.3 g/dL (ref 30.0–36.0)
MCV: 90.8 fL (ref 78.0–100.0)
Platelets: 388 10*3/uL (ref 150–400)
RBC: 4.47 MIL/uL (ref 3.87–5.11)
RDW: 13.7 % (ref 11.5–15.5)
WBC: 8.7 10*3/uL (ref 4.0–10.5)

## 2015-05-31 MED ORDER — LEVETIRACETAM 750 MG PO TABS
750.0000 mg | ORAL_TABLET | Freq: Once | ORAL | Status: AC
  Administered 2015-05-31: 750 mg via ORAL
  Filled 2015-05-31: qty 1

## 2015-05-31 MED ORDER — DIVALPROEX SODIUM 500 MG PO DR TAB
500.0000 mg | DELAYED_RELEASE_TABLET | Freq: Two times a day (BID) | ORAL | Status: DC
  Administered 2015-05-31: 500 mg via ORAL
  Filled 2015-05-31: qty 1

## 2015-05-31 NOTE — ED Notes (Signed)
Brought in by EMS from home with c/o seizure activity.  Pt's son reported that pt has had a "3-minute grand mal seizures".  Pt fell forward on her face /head at the onset of her seizure (witnessed by son), hitting head on a laminate floor.  Hx of head surgery.  Pt has been compliant with her seizure medications---- Keppra and Depakote.  Pt fully alert and awake on EMS' arrival.  No s/s apparent injury noted by EMS.  Pt was given Zofran 4 mg IV and O2 at @l /min en route to ED.

## 2015-05-31 NOTE — ED Notes (Signed)
Bed: UP73 Expected date: 05/31/15 Expected time:  Means of arrival:  Comments: EMS-seizure, head injury

## 2015-05-31 NOTE — ED Provider Notes (Signed)
CSN: [id number]     Arrival date & time 05/31/15  2139 History   First MD Initiated Contact with Patient 05/31/15 2150     Chief Complaint  Patient presents with  . Seizures     (Consider location/radiation/quality/duration/timing/severity/associated sxs/prior Treatment) Patient is a 59 y.o. female presenting with seizures. The history is provided by the patient.  Seizures Seizure activity on arrival: no   Preceding symptoms: no sensation of an aura present   Initial focality:  None Episode characteristics: generalized shaking   Return to baseline: yes   Severity:  Moderate Duration:  3 minutes Timing:  Once Context: medical non-compliance   Recent head injury:  No recent head injuries   Past Medical History  Diagnosis Date  . Seizures   . Asthma    Past Surgical History  Procedure Laterality Date  . Abdominal hysterectomy     History reviewed. No pertinent family history. Social History  Substance Use Topics  . Smoking status: Never Smoker   . Smokeless tobacco: Never Used  . Alcohol Use: Yes     Comment: drinks heavily. Agrees with drinking every other day.    OB History    No data available     Review of Systems  Constitutional: Negative for fever.  Respiratory: Negative for cough and shortness of breath.   Gastrointestinal: Positive for nausea.  Neurological: Positive for seizures.  All other systems reviewed and are negative.     Allergies  Morphine and related; Penicillins; and Strawberry  Home Medications   Prior to Admission medications   Medication Sig Start Date End Date Taking? Authorizing Provider  acetaminophen (TYLENOL) 500 MG tablet Take 1,000 mg by mouth every 6 (six) hours as needed for moderate pain.     Historical Provider, MD  acetaminophen-codeine (TYLENOL #3) 300-30 MG per tablet Take 1 tablet by mouth every 8 (eight) hours as needed for moderate pain. Patient not taking: Reported on 05/22/2015 03/08/15   Reyne Dumas, MD  albuterol  (PROVENTIL HFA;VENTOLIN HFA) 108 (90 BASE) MCG/ACT inhaler Inhale 2 puffs into the lungs every 6 (six) hours as needed for wheezing or shortness of breath. 01/19/15   Shanker Kristeen Mans, MD  diclofenac sodium (VOLTAREN) 1 % GEL Apply 4 g topically 4 (four) times daily as needed (pain).     Historical Provider, MD  divalproex (DEPAKOTE) 500 MG DR tablet Take 1 tablet (500 mg total) by mouth every 12 (twelve) hours. Patient not taking: Reported on 04/25/2015 02/07/15   Belkys A Regalado, MD  divalproex (DEPAKOTE) 500 MG DR tablet Take 1 tablet (500 mg total) by mouth every 12 (twelve) hours. 04/20/15   Carman Ching, PA-C  feeding supplement, ENSURE COMPLETE, (ENSURE COMPLETE) LIQD Take 237 mLs by mouth 2 (two) times daily between meals. 01/19/15   Shanker Kristeen Mans, MD  feeding supplement, ENSURE ENLIVE, (ENSURE ENLIVE) LIQD Take 237 mLs by mouth 2 (two) times daily between meals. Patient not taking: Reported on 04/25/2015 03/08/15   Reyne Dumas, MD  folic acid (FOLVITE) 1 MG tablet Take 1 tablet (1 mg total) by mouth daily. 01/19/15   Shanker Kristeen Mans, MD  levETIRAcetam (KEPPRA) 750 MG tablet Take 1 tablet (750 mg total) by mouth 2 (two) times daily. 02/07/15   Belkys A Regalado, MD  LORazepam (ATIVAN) 2 MG tablet Take 2 mg by mouth every 6 (six) hours as needed for anxiety.    Historical Provider, MD  magnesium oxide (MAG-OX) 400 (241.3 MG) MG tablet Take 1 tablet (  400 mg total) by mouth 2 (two) times daily. 02/07/15   Belkys A Regalado, MD  metroNIDAZOLE (FLAGYL) 500 MG tablet Take 1 tablet (500 mg total) by mouth 3 (three) times daily. Patient not taking: Reported on 04/25/2015 03/08/15   Reyne Dumas, MD  Multiple Vitamin (MULTIVITAMIN WITH MINERALS) TABS tablet Take 1 tablet by mouth daily.    Historical Provider, MD  thiamine 100 MG tablet Take 1 tablet (100 mg total) by mouth daily. 03/08/15   Reyne Dumas, MD  traMADol (ULTRAM) 50 MG tablet Take 1 tablet (50 mg total) by mouth every 6 (six) hours as  needed for moderate pain or severe pain. 03/08/15   Reyne Dumas, MD   BP 140/78 mmHg  Pulse 96  Temp(Src) 98.4 F (36.9 C) (Oral)  Resp 20  SpO2 96% Physical Exam  Constitutional: She is oriented to person, place, and time. She appears well-developed and well-nourished. No distress.  HENT:  Head: Normocephalic.    Mouth/Throat: Oropharynx is clear and moist.  Eyes: EOM are normal. Pupils are equal, round, and reactive to light.  Neck: Normal range of motion. Neck supple.  Cardiovascular: Normal rate and regular rhythm.  Exam reveals no friction rub.   No murmur heard. Pulmonary/Chest: Effort normal and breath sounds normal. No respiratory distress. She has no wheezes. She has no rales.  Abdominal: Soft. She exhibits no distension. There is no tenderness. There is no rebound.  Musculoskeletal: Normal range of motion. She exhibits no edema.  Neurological: She is alert and oriented to person, place, and time.  Skin: She is not diaphoretic.  Nursing note and vitals reviewed.   ED Course  Procedures (including critical care time) Labs Review Labs Reviewed  CBC  BASIC METABOLIC PANEL    Imaging Review Ct Head Wo Contrast  06/01/2015   CLINICAL DATA:  3 minute witnessed grand mal seizure, fell forward onto face, hitting head on laminate floor. History of head surgery, alcohol dependence with withdrawal, recurrent seizures, head trauma.  EXAM: CT HEAD WITHOUT CONTRAST  TECHNIQUE: Contiguous axial images were obtained from the base of the skull through the vertex without intravenous contrast.  COMPARISON:  CT head March 01, 2015 and multiple priors.  FINDINGS: The ventricles and sulci are normal. No intraparenchymal hemorrhage, mass effect nor midline shift. No acute large vascular territory infarcts.  No abnormal extra-axial fluid collections. Basal cisterns are patent. Mild calcific atherosclerosis the carotid siphons.  No skull fracture. Osteopenia. Small RIGHT frontal scalp hematoma. The  included ocular globes and orbital contents are non-suspicious ; smaller LEFT optic nerve sheath complexes likely artifact. Less sphenoid sinus mucosal thickening. The mastoid air cells are well aerated. 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  IMPRESSION: No acute intracranial process.  Small RIGHT frontal scalp hematoma, no skull fracture.   Electronically Signed   By: Elon Alas M.D.   On: 06/01/2015 00:25   I have personally reviewed and evaluated these images and lab results as part of my medical decision-making.   EKG Interpretation None      MDM   Final diagnoses:  Seizure    59 year old female here with a seizure. Seen multiple times for seizures, she is chronically noncompliant. She had 3 mg all seizure which is similar for her. She fell forward hitting her head on the floor is complaining of forehead pain. She states she has not been taking her Keppra or Depakote to me because she has been drinking and doesn't one of mixed alcohol and her medications. We'll  scan her head and provide PO indications. Check basic labs  Labs ok. CT ok. Given Rx for her anti-epileptics.  Stable for discharge.  Evelina Bucy, MD 06/01/15 [phone]

## 2015-06-01 MED ORDER — LEVETIRACETAM 750 MG PO TABS: 750.0000 mg | ORAL_TABLET | Freq: Two times a day (BID) | ORAL | Status: DC

## 2015-06-01 MED ORDER — DIVALPROEX SODIUM 500 MG PO DR TAB: 500.0000 mg | DELAYED_RELEASE_TABLET | Freq: Two times a day (BID) | ORAL | Status: DC

## 2015-06-01 NOTE — Discharge Instructions (Signed)

## 2015-06-01 NOTE — ED Notes (Signed)
[facility] here to transport pt to pt's home/residence.

## 2015-06-11 ENCOUNTER — Encounter: Payer: Self-pay | Admitting: Radiology

## 2015-06-16 ENCOUNTER — Emergency Department (HOSPITAL_COMMUNITY)
Admission: EM | Admit: 2015-06-16 | Discharge: 2015-06-16 | Disposition: A | Discharge status: Home / Self Care | Payer: Medicaid Other | Attending: Emergency Medicine | Admitting: Emergency Medicine

## 2015-06-16 ENCOUNTER — Emergency Department (HOSPITAL_COMMUNITY): Payer: Medicaid Other

## 2015-06-16 ENCOUNTER — Encounter (HOSPITAL_COMMUNITY): Payer: Self-pay

## 2015-06-16 DIAGNOSIS — J45909 Unspecified asthma, uncomplicated: Secondary | ICD-10-CM | POA: Insufficient documentation

## 2015-06-16 DIAGNOSIS — Z880 Allergy status to penicillin: Secondary | ICD-10-CM | POA: Insufficient documentation

## 2015-06-16 DIAGNOSIS — Z79899 Other long term (current) drug therapy: Secondary | ICD-10-CM | POA: Diagnosis not present

## 2015-06-16 DIAGNOSIS — Y998 Other external cause status: Secondary | ICD-10-CM | POA: Diagnosis not present

## 2015-06-16 DIAGNOSIS — S0081XA Abrasion of other part of head, initial encounter: Secondary | ICD-10-CM | POA: Insufficient documentation

## 2015-06-16 DIAGNOSIS — R05 Cough: Secondary | ICD-10-CM | POA: Insufficient documentation

## 2015-06-16 DIAGNOSIS — R569 Unspecified convulsions: Secondary | ICD-10-CM

## 2015-06-16 DIAGNOSIS — G40909 Epilepsy, unspecified, not intractable, without status epilepticus: Secondary | ICD-10-CM | POA: Insufficient documentation

## 2015-06-16 DIAGNOSIS — Y9289 Other specified places as the place of occurrence of the external cause: Secondary | ICD-10-CM | POA: Diagnosis not present

## 2015-06-16 DIAGNOSIS — W06XXXA Fall from bed, initial encounter: Secondary | ICD-10-CM | POA: Diagnosis not present

## 2015-06-16 DIAGNOSIS — R51 Headache: Secondary | ICD-10-CM | POA: Insufficient documentation

## 2015-06-16 DIAGNOSIS — Y9389 Activity, other specified: Secondary | ICD-10-CM | POA: Insufficient documentation

## 2015-06-16 HISTORY — DX: Alcohol abuse, in remission: F10.11

## 2015-06-16 LAB — CBC WITH DIFFERENTIAL/PLATELET
BASOS ABS: 0 10*3/uL (ref 0.0–0.1)
BASOS PCT: 0 %
EOS PCT: 1 %
Eosinophils Absolute: 0 10*3/uL (ref 0.0–0.7)
HCT: 39.3 % (ref 36.0–46.0)
Hemoglobin: 13.1 g/dL (ref 12.0–15.0)
LYMPHS PCT: 20 %
Lymphs Abs: 1.4 10*3/uL (ref 0.7–4.0)
MCH: 29.8 pg (ref 26.0–34.0)
MCHC: 33.3 g/dL (ref 30.0–36.0)
MCV: 89.3 fL (ref 78.0–100.0)
Monocytes Absolute: 0.5 10*3/uL (ref 0.1–1.0)
Monocytes Relative: 7 %
Neutro Abs: 5.1 10*3/uL (ref 1.7–7.7)
Neutrophils Relative %: 72 %
PLATELETS: 306 10*3/uL (ref 150–400)
RBC: 4.4 MIL/uL (ref 3.87–5.11)
RDW: 13.6 % (ref 11.5–15.5)
WBC: 7 10*3/uL (ref 4.0–10.5)

## 2015-06-16 LAB — COMPREHENSIVE METABOLIC PANEL
ALK PHOS: 82 U/L (ref 38–126)
ALT: 9 U/L — ABNORMAL LOW (ref 14–54)
ANION GAP: 5 (ref 5–15)
AST: 15 U/L (ref 15–41)
Albumin: 3.9 g/dL (ref 3.5–5.0)
BILIRUBIN TOTAL: 0.7 mg/dL (ref 0.3–1.2)
BUN: 15 mg/dL (ref 6–20)
CALCIUM: 9 mg/dL (ref 8.9–10.3)
CO2: 28 mmol/L (ref 22–32)
Chloride: 107 mmol/L (ref 101–111)
Creatinine, Ser: 0.83 mg/dL (ref 0.44–1.00)
GFR calc Af Amer: >60 mL/min (ref >60)
Glucose, Bld: 79 mg/dL (ref 65–99)
POTASSIUM: 4 mmol/L (ref 3.5–5.1)
Sodium: 140 mmol/L (ref 135–145)
TOTAL PROTEIN: 7.1 g/dL (ref 6.5–8.1)

## 2015-06-16 LAB — VALPROIC ACID LEVEL: VALPROIC ACID LVL: 18 ug/mL — AB (ref 50.0–100.0)

## 2015-06-16 LAB — CBG MONITORING, ED: Glucose-Capillary: 73 mg/dL (ref 65–99)

## 2015-06-16 MED ORDER — SODIUM CHLORIDE 0.9 % IV SOLN
1000.0000 mg | Freq: Once | INTRAVENOUS | Status: AC
  Administered 2015-06-16: 1000 mg via INTRAVENOUS
  Filled 2015-06-16: qty 10

## 2015-06-16 MED ORDER — LEVETIRACETAM 750 MG PO TABS: 750.0000 mg | ORAL_TABLET | Freq: Two times a day (BID) | ORAL | Status: DC

## 2015-06-16 MED ORDER — ACETAMINOPHEN 325 MG PO TABS
650.0000 mg | ORAL_TABLET | Freq: Once | ORAL | Status: AC
  Administered 2015-06-16: 650 mg via ORAL
  Filled 2015-06-16: qty 2

## 2015-06-16 NOTE — ED Notes (Signed)
Brief placed on pt, EMS said pt incontinent

## 2015-06-16 NOTE — Discharge Instructions (Signed)
Your Depakoate level is low, make sure you take this as prescribed and call your seizure specialist or primary care doctor for further instructions in your dosing.     Seizure, Adult A seizure is abnormal electrical activity in the brain. Seizures usually last from 30 seconds to 2 minutes. There are various types of seizures. Before a seizure, you may have a warning sensation (aura) that a seizure is about to occur. An aura may include the following symptoms:   Fear or anxiety.  Nausea.  Feeling like the room is spinning (vertigo).  Vision changes, such as seeing flashing lights or spots. Common symptoms during a seizure include:  A change in attention or behavior (altered mental status).  Convulsions with rhythmic jerking movements.  Drooling.  Rapid eye movements.  Grunting.  Loss of bladder and bowel control.  Bitter taste in the mouth.  Tongue biting. After a seizure, you may feel confused and sleepy. You may also have an injury resulting from convulsions during the seizure. HOME CARE INSTRUCTIONS   If you are given medicines, take them exactly as prescribed by your health care provider.  Keep all follow-up appointments as directed by your health care provider.  Do not swim or drive or engage in risky activity during which a seizure could cause further injury to you or others until your health care provider says it is OK.  Get adequate rest.  Teach friends and family what to do if you have a seizure. They should:  Lay you on the ground to prevent a fall.  Put a cushion under your head.  Loosen any tight clothing around your neck.  Turn you on your side. If vomiting occurs, this helps keep your airway clear.  Stay with you until you recover.  Know whether or not you need emergency care. SEEK IMMEDIATE MEDICAL CARE IF:  The seizure lasts longer than 5 minutes.  The seizure is severe or you do not wake up immediately after the seizure.  You have an  altered mental status after the seizure.  You are having more frequent or worsening seizures. Someone should drive you to the emergency department or call local emergency services (911 in [place] MAKE SURE YOU:  Understand these instructions.  Will watch your condition.  Will get help right away if you are not doing well or get worse. Document Released: 09/10/2000 Document Revised: 07/04/2013 Document Reviewed: 04/25/2013 [facility] Patient Information 2015 [place], [place]. This information is not intended to replace advice given to you by your health care provider. Make sure you discuss any questions you have with your health care provider.

## 2015-06-16 NOTE — ED Notes (Signed)
Bed: ZC58 Expected date:  Expected time:  Means of arrival:  Comments: EMS- Seizure/ETOH abuse

## 2015-06-16 NOTE — ED Notes (Signed)
Bed: WHALE Expected date:  Expected time:  Means of arrival:  Comments: 

## 2015-06-16 NOTE — ED Notes (Signed)
Patient transported to X-ray 

## 2015-06-16 NOTE — ED Notes (Signed)
IV attempted not successful. Blood drawn and held. Lanelle Bal RN made aware

## 2015-06-16 NOTE — ED Notes (Signed)
Per [facility]- Pt ran out of medication Keppra yesterday. Noncompliant at times. Witnessed seizures by family this AM 0700 approx. 5 minutes. No injury. Postictal. Upon arrival to ED GCS 15. Ambulatory to bathroom.

## 2015-06-16 NOTE — ED Notes (Signed)
Pt states she does not have a ride back to her residence. Pt offered bus pass. Pt states she cannot walk and does "not know how to ride the bus". Pt states she goes home by ambulance, having her family meet [facility] upon their arrival. Camera operator notified. [facility] called.

## 2015-06-16 NOTE — ED Notes (Signed)
MD at bedside. EDP [place] PRESENT 

## 2015-06-16 NOTE — ED Provider Notes (Signed)
CSN: [id number]     Arrival date & time 06/16/15  8938 History   First MD Initiated Contact with Patient 06/16/15 [phone]     Chief Complaint  Patient presents with  . Seizures     (Consider location/radiation/quality/duration/timing/severity/associated sxs/prior Treatment) HPI  59 year old female presents after 2 seizures. First seizure around 3 AM, second seizure less than one hour ago. Patient is on Keppra and Depakote. She states she took her Depakote this morning but has run out of her Keppra. Patient defers a lot of the questioning to the patient's daughter who is not currently present. Patient states she has a headache, this typically happens after a seizure. Denies any fevers but states she has been coughing a lot recently. No urinary symptoms. No focal weakness or numbness. When talking to the daughter, [name], over the phone she notes that patient told her she missed her seizure medicines either yesterday or the day before. Patient did fall out of bed and daughter think she did hit her head. Patient denies any alcohol use although there is a history of EtOH abuse and her history.  Past Medical History  Diagnosis Date  . Seizures   . Asthma   . H/O ETOH abuse    Past Surgical History  Procedure Laterality Date  . Abdominal hysterectomy     No family history on file. Social History  Substance Use Topics  . Smoking status: Never Smoker   . Smokeless tobacco: Never Used  . Alcohol Use: Yes     Comment: drinks heavily. Agrees with drinking every other day.    OB History    No data available     Review of Systems  Respiratory: Positive for cough. Negative for shortness of breath.   Gastrointestinal: Negative for vomiting.  Genitourinary: Negative for dysuria.  Neurological: Positive for seizures and headaches. Negative for weakness.  All other systems reviewed and are negative.     Allergies  Morphine and related; Penicillins; and Strawberry  Home Medications    Prior to Admission medications   Medication Sig Start Date End Date Taking? Authorizing Provider  acetaminophen (TYLENOL) 500 MG tablet Take 1,000 mg by mouth every 6 (six) hours as needed for moderate pain.     Historical Provider, MD  acetaminophen-codeine (TYLENOL #3) 300-30 MG per tablet Take 1 tablet by mouth every 8 (eight) hours as needed for moderate pain. Patient not taking: Reported on 05/22/2015 03/08/15   Reyne Dumas, MD  albuterol (PROVENTIL HFA;VENTOLIN HFA) 108 (90 BASE) MCG/ACT inhaler Inhale 2 puffs into the lungs every 6 (six) hours as needed for wheezing or shortness of breath. 01/19/15   Shanker Kristeen Mans, MD  diclofenac sodium (VOLTAREN) 1 % GEL Apply 4 g topically 4 (four) times daily as needed (pain).     Historical Provider, MD  divalproex (DEPAKOTE) 500 MG DR tablet Take 1 tablet (500 mg total) by mouth every 12 (twelve) hours. Patient not taking: Reported on 04/25/2015 02/07/15   Belkys A Regalado, MD  divalproex (DEPAKOTE) 500 MG DR tablet Take 1 tablet (500 mg total) by mouth every 12 (twelve) hours. 04/20/15   Robyn M Hess, PA-C  divalproex (DEPAKOTE) 500 MG DR tablet Take 1 tablet (500 mg total) by mouth every 12 (twelve) hours. 06/01/15   Evelina Bucy, MD  feeding supplement, ENSURE COMPLETE, (ENSURE COMPLETE) LIQD Take 237 mLs by mouth 2 (two) times daily between meals. 01/19/15   Shanker Kristeen Mans, MD  feeding supplement, ENSURE ENLIVE, (ENSURE ENLIVE) LIQD Take  237 mLs by mouth 2 (two) times daily between meals. Patient not taking: Reported on 04/25/2015 03/08/15   Reyne Dumas, MD  folic acid (FOLVITE) 1 MG tablet Take 1 tablet (1 mg total) by mouth daily. 01/19/15   Shanker Kristeen Mans, MD  levETIRAcetam (KEPPRA) 750 MG tablet Take 1 tablet (750 mg total) by mouth 2 (two) times daily. 02/07/15   Belkys A Regalado, MD  levETIRAcetam (KEPPRA) 750 MG tablet Take 1 tablet (750 mg total) by mouth 2 (two) times daily. 06/01/15   Evelina Bucy, MD  LORazepam (ATIVAN) 2 MG tablet Take  2 mg by mouth every 6 (six) hours as needed for anxiety.    Historical Provider, MD  magnesium oxide (MAG-OX) 400 (241.3 MG) MG tablet Take 1 tablet (400 mg total) by mouth 2 (two) times daily. 02/07/15   Belkys A Regalado, MD  metroNIDAZOLE (FLAGYL) 500 MG tablet Take 1 tablet (500 mg total) by mouth 3 (three) times daily. Patient not taking: Reported on 04/25/2015 03/08/15   Reyne Dumas, MD  Multiple Vitamin (MULTIVITAMIN WITH MINERALS) TABS tablet Take 1 tablet by mouth daily.    Historical Provider, MD  thiamine 100 MG tablet Take 1 tablet (100 mg total) by mouth daily. 03/08/15   Reyne Dumas, MD  traMADol (ULTRAM) 50 MG tablet Take 1 tablet (50 mg total) by mouth every 6 (six) hours as needed for moderate pain or severe pain. 03/08/15   Reyne Dumas, MD   BP 190/87 mmHg  Temp(Src) 97.5 F (36.4 C) (Oral)  Resp 20  SpO2 95% Physical Exam  Constitutional: She is oriented to person, place, and time. She appears well-developed and well-nourished.  HENT:  Head: Normocephalic. Head is with abrasion.    Right Ear: External ear normal.  Left Ear: External ear normal.  Nose: Nose normal.  Eyes: EOM are normal. Pupils are equal, round, and reactive to light. Right eye exhibits no discharge. Left eye exhibits no discharge.  Neck: Normal range of motion. Neck supple.  Cardiovascular: Normal rate, regular rhythm and normal heart sounds.   Pulmonary/Chest: Effort normal and breath sounds normal.  Abdominal: Soft. There is no tenderness.  Neurological: She is alert and oriented to person, place, and time.  Awake, alert, and appropriate. CN 2-12 grossly intact, 5/5 strength in all 4 extremiteis  Skin: Skin is warm and dry.  Nursing note and vitals reviewed.   ED Course  Procedures (including critical care time) Labs Review Labs Reviewed  COMPREHENSIVE METABOLIC PANEL - Abnormal; Notable for the following:    ALT 9 (*)    All other components within normal limits  VALPROIC ACID LEVEL -  Abnormal; Notable for the following:    Valproic Acid Lvl 18 (*)    All other components within normal limits  CBC WITH DIFFERENTIAL/PLATELET  CBG MONITORING, ED    Imaging Review Dg Chest 2 View  06/16/2015   CLINICAL DATA:  Witnessed seizure this morning. Cough. History of asthma.  EXAM: CHEST  2 VIEW  COMPARISON:  03/01/2015  FINDINGS: Heart and mediastinal contours are within normal limits. No focal opacities or effusions. No acute bony abnormality. Degenerative changes in the shoulders.  IMPRESSION: No active cardiopulmonary disease.   Electronically Signed   By: Rolm Baptise M.D.   On: 06/16/2015 10:38   Ct Head Wo Contrast  06/16/2015   CLINICAL DATA:  Witnessed seizure this morning.  EXAM: CT HEAD WITHOUT CONTRAST  TECHNIQUE: Contiguous axial images were obtained from the base of the skull  through the vertex without intravenous contrast.  COMPARISON:  05/31/2015  FINDINGS: No acute intracranial abnormality. Specifically, no hemorrhage, hydrocephalus, mass lesion, acute infarction, or significant intracranial injury. No acute calvarial abnormality. Visualized paranasal sinuses and mastoids clear. Orbital soft tissues unremarkable.  IMPRESSION: Negative.   Electronically Signed   By: Rolm Baptise M.D.   On: 06/16/2015 10:50   I have personally reviewed and evaluated these images and lab results as part of my medical decision-making.   EKG Interpretation None      MDM   Final diagnoses:  Seizure    Patient given IV Keppra given that she has missed at least one dose if not more and his had 2 seizures today. No seizure-like activity or altered mental status while being observed in the emergency department. Patient was told about her low Depakote level and encouraged to follow up with her PCP and/or neurologist for further outpatient dosing. Stable for discharge home.    Sherwood Gambler, MD 06/16/15 [phone]

## 2015-08-15 ENCOUNTER — Encounter (HOSPITAL_COMMUNITY): Payer: Self-pay

## 2015-08-15 ENCOUNTER — Emergency Department (HOSPITAL_COMMUNITY): Payer: Medicaid Other

## 2015-08-15 ENCOUNTER — Inpatient Hospital Stay (HOSPITAL_COMMUNITY)
Admission: EM | Admit: 2015-08-15 | Discharge: 2015-08-19 | DRG: 100 | Disposition: A | Discharge status: Skilled Nursing Facility | Payer: Medicaid Other | Attending: Internal Medicine | Admitting: Internal Medicine

## 2015-08-15 DIAGNOSIS — R9431 Abnormal electrocardiogram [ECG] [EKG]: Secondary | ICD-10-CM | POA: Diagnosis present

## 2015-08-15 DIAGNOSIS — G40909 Epilepsy, unspecified, not intractable, without status epilepticus: Secondary | ICD-10-CM | POA: Diagnosis not present

## 2015-08-15 DIAGNOSIS — R300 Dysuria: Secondary | ICD-10-CM | POA: Diagnosis present

## 2015-08-15 DIAGNOSIS — F1011 Alcohol abuse, in remission: Secondary | ICD-10-CM | POA: Diagnosis present

## 2015-08-15 DIAGNOSIS — Z880 Allergy status to penicillin: Secondary | ICD-10-CM | POA: Diagnosis not present

## 2015-08-15 DIAGNOSIS — K208 Other esophagitis: Secondary | ICD-10-CM | POA: Diagnosis present

## 2015-08-15 DIAGNOSIS — F1010 Alcohol abuse, uncomplicated: Secondary | ICD-10-CM

## 2015-08-15 DIAGNOSIS — Z885 Allergy status to narcotic agent status: Secondary | ICD-10-CM | POA: Diagnosis not present

## 2015-08-15 DIAGNOSIS — Z91018 Allergy to other foods: Secondary | ICD-10-CM | POA: Diagnosis not present

## 2015-08-15 DIAGNOSIS — Z6822 Body mass index (BMI) 22.0-22.9, adult: Secondary | ICD-10-CM | POA: Diagnosis not present

## 2015-08-15 DIAGNOSIS — R4182 Altered mental status, unspecified: Secondary | ICD-10-CM | POA: Diagnosis present

## 2015-08-15 DIAGNOSIS — G40901 Epilepsy, unspecified, not intractable, with status epilepticus: Secondary | ICD-10-CM | POA: Diagnosis present

## 2015-08-15 DIAGNOSIS — G9340 Encephalopathy, unspecified: Secondary | ICD-10-CM | POA: Diagnosis present

## 2015-08-15 DIAGNOSIS — Z79899 Other long term (current) drug therapy: Secondary | ICD-10-CM | POA: Diagnosis not present

## 2015-08-15 DIAGNOSIS — R569 Unspecified convulsions: Secondary | ICD-10-CM | POA: Diagnosis present

## 2015-08-15 DIAGNOSIS — Z9119 Patient's noncompliance with other medical treatment and regimen: Secondary | ICD-10-CM

## 2015-08-15 DIAGNOSIS — E43 Unspecified severe protein-calorie malnutrition: Secondary | ICD-10-CM

## 2015-08-15 DIAGNOSIS — J45909 Unspecified asthma, uncomplicated: Secondary | ICD-10-CM | POA: Diagnosis present

## 2015-08-15 DIAGNOSIS — Z23 Encounter for immunization: Secondary | ICD-10-CM

## 2015-08-15 LAB — CBC WITH DIFFERENTIAL/PLATELET
BASOS ABS: 0 10*3/uL (ref 0.0–0.1)
Basophils Relative: 0 %
EOS ABS: 0 10*3/uL (ref 0.0–0.7)
Eosinophils Relative: 0 %
HCT: 37.5 % (ref 36.0–46.0)
HEMOGLOBIN: 12.6 g/dL (ref 12.0–15.0)
LYMPHS ABS: 1.2 10*3/uL (ref 0.7–4.0)
LYMPHS PCT: 12 %
MCH: 30.4 pg (ref 26.0–34.0)
MCHC: 33.6 g/dL (ref 30.0–36.0)
MCV: 90.4 fL (ref 78.0–100.0)
Monocytes Absolute: 0.7 10*3/uL (ref 0.1–1.0)
Monocytes Relative: 7 %
NEUTROS PCT: 81 %
Neutro Abs: 8.3 10*3/uL — ABNORMAL HIGH (ref 1.7–7.7)
PLATELETS: 424 10*3/uL — AB (ref 150–400)
RBC: 4.15 MIL/uL (ref 3.87–5.11)
RDW: 14.1 % (ref 11.5–15.5)
WBC: 10.3 10*3/uL (ref 4.0–10.5)

## 2015-08-15 LAB — COMPREHENSIVE METABOLIC PANEL
ALT: 12 U/L — ABNORMAL LOW (ref 14–54)
AST: 23 U/L (ref 15–41)
Albumin: 3.8 g/dL (ref 3.5–5.0)
Alkaline Phosphatase: 91 U/L (ref 38–126)
Anion gap: 11 (ref 5–15)
BILIRUBIN TOTAL: 1.1 mg/dL (ref 0.3–1.2)
BUN: 15 mg/dL (ref 6–20)
CHLORIDE: 104 mmol/L (ref 101–111)
CO2: 22 mmol/L (ref 22–32)
CREATININE: 0.65 mg/dL (ref 0.44–1.00)
Calcium: 9.2 mg/dL (ref 8.9–10.3)
Glucose, Bld: 96 mg/dL (ref 65–99)
POTASSIUM: 3.8 mmol/L (ref 3.5–5.1)
Sodium: 137 mmol/L (ref 135–145)
TOTAL PROTEIN: 7.3 g/dL (ref 6.5–8.1)

## 2015-08-15 LAB — ETHANOL

## 2015-08-15 LAB — CBG MONITORING, ED: GLUCOSE-CAPILLARY: 82 mg/dL (ref 65–99)

## 2015-08-15 LAB — VALPROIC ACID LEVEL: Valproic Acid Lvl: <10 ug/mL — ABNORMAL LOW (ref 50.0–100.0)

## 2015-08-15 MED ORDER — THIAMINE HCL 100 MG/ML IJ SOLN
Freq: Once | INTRAVENOUS | Status: AC
  Administered 2015-08-15: 19:00:00 via INTRAVENOUS
  Filled 2015-08-15: qty 1000

## 2015-08-15 MED ORDER — LORAZEPAM 2 MG/ML IJ SOLN
1.0000 mg | Freq: Once | INTRAMUSCULAR | Status: AC
Start: 2015-08-15 — End: 2015-08-15
  Administered 2015-08-15: 1 mg via INTRAVENOUS
  Filled 2015-08-15: qty 1

## 2015-08-15 MED ORDER — LORAZEPAM 2 MG/ML IJ SOLN
INTRAMUSCULAR | Status: AC
  Filled 2015-08-15: qty 1

## 2015-08-15 MED ORDER — SODIUM CHLORIDE 0.9 % IV SOLN
750.0000 mg | Freq: Two times a day (BID) | INTRAVENOUS | Status: DC
  Administered 2015-08-16 – 2015-08-17 (×3): 750 mg via INTRAVENOUS
  Filled 2015-08-15: qty 7.5
  Filled 2015-08-15: qty 5
  Filled 2015-08-15 (×2): qty 7.5

## 2015-08-15 MED ORDER — POLYETHYLENE GLYCOL 3350 17 G PO PACK
17.0000 g | PACK | Freq: Every day | ORAL | Status: DC | PRN
  Filled 2015-08-15: qty 1

## 2015-08-15 MED ORDER — DIAZEPAM 5 MG/ML IJ SOLN
INTRAMUSCULAR | Status: AC
  Filled 2015-08-15: qty 2

## 2015-08-15 MED ORDER — VITAMIN B-1 100 MG PO TABS
100.0000 mg | ORAL_TABLET | Freq: Every day | ORAL | Status: DC
  Administered 2015-08-17 – 2015-08-19 (×3): 100 mg via ORAL
  Filled 2015-08-15 (×4): qty 1

## 2015-08-15 MED ORDER — DEXTROSE 5 % IV SOLN
1.0000 g | INTRAVENOUS | Status: DC
  Administered 2015-08-15: 1 g via INTRAVENOUS
  Filled 2015-08-15 (×2): qty 10

## 2015-08-15 MED ORDER — SODIUM CHLORIDE 0.9 % IV SOLN
75.0000 mL/h | INTRAVENOUS | Status: DC
Start: 2015-08-15 — End: 2015-08-19
  Administered 2015-08-17: 75 mL/h via INTRAVENOUS

## 2015-08-15 MED ORDER — ADULT MULTIVITAMIN W/MINERALS CH
1.0000 | ORAL_TABLET | Freq: Every day | ORAL | Status: DC
  Administered 2015-08-17 – 2015-08-19 (×3): 1 via ORAL
  Filled 2015-08-15 (×4): qty 1

## 2015-08-15 MED ORDER — FOLIC ACID 1 MG PO TABS
1.0000 mg | ORAL_TABLET | Freq: Every day | ORAL | Status: DC
  Administered 2015-08-17 – 2015-08-19 (×3): 1 mg via ORAL
  Filled 2015-08-15 (×4): qty 1

## 2015-08-15 MED ORDER — FOLIC ACID 1 MG PO TABS: 1.0000 mg | ORAL_TABLET | Freq: Every day | ORAL | Status: DC

## 2015-08-15 MED ORDER — HEPARIN SODIUM (PORCINE) 5000 UNIT/ML IJ SOLN
5000.0000 [IU] | Freq: Three times a day (TID) | INTRAMUSCULAR | Status: DC
  Administered 2015-08-15 – 2015-08-19 (×11): 5000 [IU] via SUBCUTANEOUS
  Filled 2015-08-15 (×14): qty 1

## 2015-08-15 MED ORDER — THIAMINE HCL 100 MG/ML IJ SOLN
100.0000 mg | Freq: Every day | INTRAMUSCULAR | Status: DC
Start: 2015-08-15 — End: 2015-08-15
  Administered 2015-08-15: 100 mg via INTRAVENOUS
  Filled 2015-08-15: qty 2

## 2015-08-15 MED ORDER — FOLIC ACID 5 MG/ML IJ SOLN
1.0000 mg | Freq: Every day | INTRAMUSCULAR | Status: DC
  Filled 2015-08-15: qty 0.2

## 2015-08-15 MED ORDER — FOLIC ACID 5 MG/ML IJ SOLN
1.0000 mg | Freq: Every day | INTRAMUSCULAR | Status: DC
  Administered 2015-08-16: 1 mg via INTRAVENOUS
  Filled 2015-08-15 (×5): qty 0.2

## 2015-08-15 MED ORDER — SODIUM CHLORIDE 0.9 % IV SOLN
INTRAVENOUS | Status: AC
  Administered 2015-08-15: 16:00:00 via INTRAVENOUS

## 2015-08-15 MED ORDER — FOLIC ACID 1 MG PO TABS
1.0000 mg | ORAL_TABLET | Freq: Every day | ORAL | Status: DC
  Filled 2015-08-15: qty 1

## 2015-08-15 MED ORDER — LORAZEPAM 2 MG/ML IJ SOLN
0.0000 mg | Freq: Four times a day (QID) | INTRAMUSCULAR | Status: AC
  Administered 2015-08-15 – 2015-08-17 (×5): 2 mg via INTRAVENOUS
  Administered 2015-08-17: 1 mg via INTRAVENOUS
  Filled 2015-08-15 (×5): qty 1

## 2015-08-15 MED ORDER — VALPROATE SODIUM 500 MG/5ML IV SOLN
500.0000 mg | Freq: Two times a day (BID) | INTRAVENOUS | Status: DC
  Administered 2015-08-15 – 2015-08-17 (×4): 500 mg via INTRAVENOUS
  Filled 2015-08-15 (×5): qty 5

## 2015-08-15 MED ORDER — MAGNESIUM OXIDE 400 (241.3 MG) MG PO TABS
400.0000 mg | ORAL_TABLET | Freq: Two times a day (BID) | ORAL | Status: DC
  Filled 2015-08-15 (×3): qty 1

## 2015-08-15 MED ORDER — ONDANSETRON HCL 4 MG/2ML IJ SOLN: 4.0000 mg | Freq: Four times a day (QID) | INTRAMUSCULAR | Status: DC | PRN

## 2015-08-15 MED ORDER — LORAZEPAM 1 MG PO TABS
1.0000 mg | ORAL_TABLET | Freq: Four times a day (QID) | ORAL | Status: AC | PRN
  Administered 2015-08-16 – 2015-08-18 (×2): 1 mg via ORAL
  Filled 2015-08-15 (×2): qty 1

## 2015-08-15 MED ORDER — LORAZEPAM 2 MG/ML IJ SOLN
2.0000 mg | Freq: Once | INTRAMUSCULAR | Status: AC
  Administered 2015-08-15: 2 mg via INTRAVENOUS

## 2015-08-15 MED ORDER — THIAMINE HCL 100 MG/ML IJ SOLN
100.0000 mg | Freq: Every day | INTRAMUSCULAR | Status: DC
  Administered 2015-08-16: 100 mg via INTRAVENOUS
  Filled 2015-08-15 (×3): qty 1

## 2015-08-15 MED ORDER — LEVETIRACETAM 500 MG/5ML IV SOLN
1000.0000 mg | Freq: Once | INTRAVENOUS | Status: AC
  Administered 2015-08-15: 1000 mg via INTRAVENOUS
  Filled 2015-08-15: qty 5

## 2015-08-15 MED ORDER — ONDANSETRON HCL 4 MG PO TABS: 4.0000 mg | ORAL_TABLET | Freq: Four times a day (QID) | ORAL | Status: DC | PRN

## 2015-08-15 MED ORDER — LORAZEPAM 2 MG/ML IJ SOLN
1.0000 mg | Freq: Four times a day (QID) | INTRAMUSCULAR | Status: AC | PRN
  Filled 2015-08-15: qty 1

## 2015-08-15 MED ORDER — INFLUENZA VAC SPLIT QUAD 0.5 ML IM SUSY
0.5000 mL | PREFILLED_SYRINGE | INTRAMUSCULAR | Status: AC
  Administered 2015-08-17: 0.5 mL via INTRAMUSCULAR
  Filled 2015-08-15 (×2): qty 0.5

## 2015-08-15 MED ORDER — LORAZEPAM 2 MG/ML IJ SOLN
0.0000 mg | Freq: Two times a day (BID) | INTRAMUSCULAR | Status: AC
  Administered 2015-08-17: 2 mg via INTRAVENOUS
  Filled 2015-08-15: qty 1

## 2015-08-15 NOTE — ED Notes (Signed)
Oxygen sat dropped to 85%. Patient placed on 2L via nasal cannula. O2 sat 98%.

## 2015-08-15 NOTE — ED Notes (Signed)
Patient presents to ED by EMS after having 3 witnessed seizures by family in a 30 minute time frame. Seizures stopped at approx. 0825. Grand mal in nature. Hasn't taken depakote in 3 weeks. Prescribed Keppra but does not take it.

## 2015-08-15 NOTE — ED Notes (Signed)
Daughter 'Arrie Aran' called. UPdated on patient's condition. She states she is patient's primary caretaker.

## 2015-08-15 NOTE — ED Notes (Signed)
Patient transported to CT 

## 2015-08-15 NOTE — ED Notes (Signed)
Unable to I & O cath due to patient refusal and combative nature. MD notified.

## 2015-08-15 NOTE — ED Notes (Signed)
Placed pt on bedpan, pt uncooperative and rolls off of bedpan. Pt's mental status has improved significantly, pt tracks visually and responds to verbal stimuli but remains mute. Pt does not give any indication to staff that she understands what staff says.

## 2015-08-15 NOTE — H&P (Signed)
[facility] History and Physical     History and Physical:    Lindsey [name]   [id number] DOB: 09-14-1956 DOA: 08/15/2015  Referring MD/provider: Dr. Tomi Bamberger PCP: Jackelyn Knife, MD   Chief Complaint: encephalopathy  History of Present Illness:   Lindsey [name] is an 59 y.o. female with past medical history and polysubstance abuse including alcohol abuse with noncompliance of her antiseizure medications comes to the ED coating to her daughter she had 3 seizures in the morning. The patient is a final path ecchymosis of the history is obtained from the chart and by talking to her daughter. She relates she has not been taking her medications at home continues to drink alcohol. Her lax drink was 2 days prior to visit to the ED. Here in the ED she had a few seizures according to the staff she had 2-3 she was given Ativan and loaded with Keppra. When I went to the room the patient had ongoing seizures and she had to be given IV Ativan. With cessation of her seizures. According to the daughter she has also been complaining of burning when she urinates.   In the ED: Basic metabolic loss within normal limits she had a mild thrombocytosis of 424, her blood work as it was less than 10 alcohol level system 5 blood glucose was 96 and CT scan of the head is below  ROS:   ROS  Constitutional: Cannot assess review system as patient is somnolent due to several doses of Ativan.   Past Medical History:   Past Medical History  Diagnosis Date  . Seizures ([place])   . Asthma   . H/O ETOH abuse     Past Surgical History:   Past Surgical History  Procedure Laterality Date  . Abdominal hysterectomy      Social History:   Social History   Social History  . Marital Status: Legally Separated    Spouse Name: N/A  . Number of Children: N/A  . Years of Education: N/A   Occupational History  . Not on file.   Social History Main Topics  . Smoking status: Never Smoker   .  Smokeless tobacco: Never Used  . Alcohol Use: Yes     Comment: drinks heavily. Agrees with drinking every other day.   . Drug Use: Yes    Special: Marijuana     Comment: "every now and then"  . Sexual Activity: Not on file   Other Topics Concern  . Not on file   Social History Narrative    Family history:   History reviewed. No pertinent family history.  Cannot obtain a family history as patient is heavily sedated, when I spoke to the daughter she cannot give me any information about her father or grandmother  Allergies   Morphine and related; Penicillins; and Strawberry extract  Current Medications:   Prior to Admission medications   Medication Sig Start Date End Date Taking? Authorizing Provider  acetaminophen-codeine (TYLENOL #3) 300-30 MG per tablet Take 1 tablet by mouth every 8 (eight) hours as needed for moderate pain. 03/08/15  Yes Reyne Dumas, MD  albuterol (PROVENTIL HFA;VENTOLIN HFA) 108 (90 BASE) MCG/ACT inhaler Inhale 2 puffs into the lungs every 6 (six) hours as needed for wheezing or shortness of breath. 01/19/15  Yes Shanker Kristeen Mans, MD  Cholecalciferol (VITAMIN D PO) Take 1 tablet by mouth daily.   Yes Historical Provider, MD  divalproex (DEPAKOTE) 500 MG DR tablet Take 1 tablet (500 mg total) by mouth every  12 (twelve) hours. 04/20/15  Yes Robyn M Hess, PA-C  feeding supplement, ENSURE COMPLETE, (ENSURE COMPLETE) LIQD Take 237 mLs by mouth 2 (two) times daily between meals. 01/19/15  Yes Shanker Kristeen Mans, MD  folic acid (FOLVITE) 1 MG tablet Take 1 tablet (1 mg total) by mouth daily. 01/19/15  Yes Shanker Kristeen Mans, MD  LORazepam (ATIVAN) 2 MG tablet Take 2 mg by mouth every 6 (six) hours as needed for anxiety.   Yes Historical Provider, MD  magnesium oxide (MAG-OX) 400 (241.3 MG) MG tablet Take 1 tablet (400 mg total) by mouth 2 (two) times daily. 02/07/15  Yes Belkys A Regalado, MD  Multiple Vitamin (MULTIVITAMIN WITH MINERALS) TABS tablet Take 1 tablet by mouth  daily.   Yes Historical Provider, MD  traMADol (ULTRAM) 50 MG tablet Take 1 tablet (50 mg total) by mouth every 6 (six) hours as needed for moderate pain or severe pain. 03/08/15  Yes Reyne Dumas, MD  levETIRAcetam (KEPPRA) 750 MG tablet Take 1 tablet (750 mg total) by mouth 2 (two) times daily. Patient not taking: Reported on 08/15/2015 06/16/15   Sherwood Gambler, MD  metroNIDAZOLE (FLAGYL) 500 MG tablet Take 1 tablet (500 mg total) by mouth 3 (three) times daily. Patient not taking: Reported on 04/25/2015 03/08/15   Reyne Dumas, MD  thiamine 100 MG tablet Take 1 tablet (100 mg total) by mouth daily. Patient not taking: Reported on 08/15/2015 03/08/15   Reyne Dumas, MD    Physical Exam:   Filed Vitals:   08/15/15 1030 08/15/15 1130 08/15/15 1200 08/15/15 1215  BP:   149/74   Pulse: 75 89 90 93  Temp:   98.3 F (36.8 C)   TempSrc:   Oral   Resp: 29 17 17 19   SpO2: 95% 100% 99% 85%     Physical Exam: Blood pressure 149/74, pulse 93, temperature 98.3 F (36.8 C), temperature source Oral, resp. rate 19, SpO2 85 %. Gen: No acute distress. Somnolent, cachectic Head: Normocephalic, atraumatic. Eyes: PERRL, EOMI, sclerae nonicteric. Mouth: Oropharynx Neck: Supple, no thyromegaly, no lymphadenopathy, no jugular venous distention. Chest: Good air movement clear to auscultation CV: Regular rate and rhythm with positive S1-S2 no murmurs rubs gallops Abdomen: Soft, nontender, nondistended with normal active bowel sounds. Extremities: Extremities Skin: Warm and dry. Neuro: She is somnolent unresponsive to pain moving all 4 extremities to nausea stimuli Psych: Mood and affect normal.   Data Review:    Labs: Basic Metabolic Panel:  Recent Labs Lab 08/15/15 0949  NA 137  K 3.8  CL 104  CO2 22  GLUCOSE 96  BUN 15  CREATININE 0.65  CALCIUM 9.2   Liver Function Tests:  Recent Labs Lab 08/15/15 0949  AST 23  ALT 12*  ALKPHOS 91  BILITOT 1.1  PROT 7.3  ALBUMIN 3.8   No  results for input(s): LIPASE, AMYLASE in the last 168 hours. No results for input(s): AMMONIA in the last 168 hours. CBC:  Recent Labs Lab 08/15/15 0949  WBC 10.3  NEUTROABS 8.3*  HGB 12.6  HCT 37.5  MCV 90.4  PLT 424*   Cardiac Enzymes: No results for input(s): CKTOTAL, CKMB, CKMBINDEX, TROPONINI in the last 168 hours.  BNP (last 3 results) No results for input(s): PROBNP in the last 8760 hours. CBG:  Recent Labs Lab 08/15/15 1052  GLUCAP 82    Radiographic Studies: Ct Head Wo Contrast  08/15/2015  CLINICAL DATA:  Status post seizure x3 today.  Subsequent encounter. EXAM: CT HEAD  WITHOUT CONTRAST TECHNIQUE: Contiguous axial images were obtained from the base of the skull through the vertex without intravenous contrast. COMPARISON:  Head CT scan 06/16/2015 and 05/31/2015. FINDINGS: No evidence of acute intracranial abnormality including hemorrhage, infarct, mass lesion, mass effect, midline shift or abnormal extra-axial fluid collection is seen. There is no hydrocephalus or pneumocephalus. The calvarium is intact. Imaged paranasal sinuses and mastoid air cells are clear. IMPRESSION: Negative exam. Electronically Signed   By: Inge Rise M.D.   On: 08/15/2015 10:23   *I have personally reviewed the images above*  EKG: Independently reviewed. *Normal sinus rhythm no T wave abnormalities   Assessment/Plan:   Acute encephalopathy due to post icta state  Seizure disorder [facility]): She was given  Ativan in the ED, when I got into the room she was seizing so I gave her 2 mg of IV Ativan. I loaded her with Keppra. Alcohol level is less than 5,  check a UDS, also check a UA as the daughter is relating that she had been complaining of dysuria before coming into the ED.  The daughter relates that she has not been taking any of her antiseizure medication so we'll switch her to IV, place her in nothing by mouth seizure precautions and still she is more awake. Neurology is been  consulted by the ED physician.  Alcohol abuse - Give her thiamine and folate, will monitor with CIWA protocol for signs of withdrawal time if needed we'll start her on Ativan.  Protein-calorie malnutrition, severe ([place])  have to do counseling and start her on ensure 3 times a day.  DVT prophylaxis  Code Status: Full. Family Communication: Spoke to the daughter over the phone which is [phone] Disposition Plan: Home when stable.  Time spent: 75 min  [name] [facility] Pager [phone]  If 7PM-7AM, please contact night-coverage [email or web address] Password [email or web address] 08/15/2015, 2:55 PM

## 2015-08-15 NOTE — ED Notes (Signed)
Bed: WA05 Expected date:  Expected time:  Means of arrival:  Comments: EMS/seizure 

## 2015-08-15 NOTE — ED Provider Notes (Signed)
CSN: [id number]     Arrival date & time 08/15/15  [id number] History   First MD Initiated Contact with Patient 08/15/15 [phone]     Chief Complaint  Patient presents with  . Seizures   Level V caveat: Altered mental status HPI Patient presents to the emergency room for evaluation of having 3 witnessed seizures by her family within the last 30 minutes. She has a known history of seizure disorder. According to EMS reports the patient hasn't taken any Depakote 3 weeks. She has also been prescribed Keppra but she does not take her medication. Patient appears postictal and is unable to provide any history.  Nurses observed another seizure while in the ED Past Medical History  Diagnosis Date  . Seizures ([place])   . Asthma   . H/O ETOH abuse    Past Surgical History  Procedure Laterality Date  . Abdominal hysterectomy     History reviewed. No pertinent family history. Social History  Substance Use Topics  . Smoking status: Never Smoker   . Smokeless tobacco: Never Used  . Alcohol Use: Yes     Comment: drinks heavily. Agrees with drinking every other day.    OB History    No data available     Review of Systems  Unable to perform ROS: Mental status change      Allergies  Morphine and related; Penicillins; and Strawberry extract  Home Medications   Prior to Admission medications   Medication Sig Start Date End Date Taking? Authorizing Provider  acetaminophen-codeine (TYLENOL #3) 300-30 MG per tablet Take 1 tablet by mouth every 8 (eight) hours as needed for moderate pain. Patient not taking: Reported on 05/22/2015 03/08/15   Reyne Dumas, MD  albuterol (PROVENTIL HFA;VENTOLIN HFA) 108 (90 BASE) MCG/ACT inhaler Inhale 2 puffs into the lungs every 6 (six) hours as needed for wheezing or shortness of breath. 01/19/15   Shanker Kristeen Mans, MD  divalproex (DEPAKOTE) 500 MG DR tablet Take 1 tablet (500 mg total) by mouth every 12 (twelve) hours. Patient not taking: Reported on 04/25/2015 02/07/15    Belkys A Regalado, MD  divalproex (DEPAKOTE) 500 MG DR tablet Take 1 tablet (500 mg total) by mouth every 12 (twelve) hours. 04/20/15   Robyn M Hess, PA-C  divalproex (DEPAKOTE) 500 MG DR tablet Take 1 tablet (500 mg total) by mouth every 12 (twelve) hours. Patient not taking: Reported on 06/16/2015 06/01/15   Evelina Bucy, MD  feeding supplement, ENSURE COMPLETE, (ENSURE COMPLETE) LIQD Take 237 mLs by mouth 2 (two) times daily between meals. 01/19/15   Shanker Kristeen Mans, MD  feeding supplement, ENSURE ENLIVE, (ENSURE ENLIVE) LIQD Take 237 mLs by mouth 2 (two) times daily between meals. Patient not taking: Reported on 04/25/2015 03/08/15   Reyne Dumas, MD  folic acid (FOLVITE) 1 MG tablet Take 1 tablet (1 mg total) by mouth daily. 01/19/15   Shanker Kristeen Mans, MD  levETIRAcetam (KEPPRA) 750 MG tablet Take 1 tablet (750 mg total) by mouth 2 (two) times daily. 06/16/15   Sherwood Gambler, MD  LORazepam (ATIVAN) 2 MG tablet Take 2 mg by mouth every 6 (six) hours as needed for anxiety.    Historical Provider, MD  magnesium oxide (MAG-OX) 400 (241.3 MG) MG tablet Take 1 tablet (400 mg total) by mouth 2 (two) times daily. 02/07/15   Belkys A Regalado, MD  metroNIDAZOLE (FLAGYL) 500 MG tablet Take 1 tablet (500 mg total) by mouth 3 (three) times daily. Patient not taking: Reported on 04/25/2015  03/08/15   Reyne Dumas, MD  Multiple Vitamin (MULTIVITAMIN WITH MINERALS) TABS tablet Take 1 tablet by mouth daily.    Historical Provider, MD  thiamine 100 MG tablet Take 1 tablet (100 mg total) by mouth daily. 03/08/15   Reyne Dumas, MD  traMADol (ULTRAM) 50 MG tablet Take 1 tablet (50 mg total) by mouth every 6 (six) hours as needed for moderate pain or severe pain. 03/08/15   Reyne Dumas, MD   BP 149/74 mmHg  Pulse 93  Temp(Src) 98.3 F (36.8 C) (Oral)  Resp 19  SpO2 85% Physical Exam  Constitutional: She appears lethargic. No distress.  HENT:  Head: Normocephalic and atraumatic.  Right Ear: External ear normal.   Left Ear: External ear normal.  Eyes: Conjunctivae are normal. Right eye exhibits no discharge. Left eye exhibits no discharge. No scleral icterus.  Neck: Neck supple. No tracheal deviation present.  Cardiovascular: Normal rate, regular rhythm and intact distal pulses.   Pulmonary/Chest: Effort normal and breath sounds normal. No stridor. No respiratory distress. She has no wheezes. She has no rales.  Abdominal: Soft. Bowel sounds are normal. She exhibits no distension. There is no tenderness. There is no rebound and no guarding.  Musculoskeletal: She exhibits no edema or tenderness.  Neurological: She appears lethargic. No cranial nerve deficit (no facial droop, extraocular movements intact, no slurred speech) or sensory deficit. She exhibits normal muscle tone. Coordination normal. GCS eye subscore is 2. GCS verbal subscore is 2. GCS motor subscore is 5.  Patient response to painful stimuli but does not follow commands and does not speak  Skin: Skin is warm and dry. No rash noted.  Psychiatric: She has a normal mood and affect.  Nursing note and vitals reviewed.   ED Course  Procedures (including critical care time) Labs Review Labs Reviewed  COMPREHENSIVE METABOLIC PANEL - Abnormal; Notable for the following:    ALT 12 (*)    All other components within normal limits  CBC WITH DIFFERENTIAL/PLATELET - Abnormal; Notable for the following:    Platelets 424 (*)    Neutro Abs 8.3 (*)    All other components within normal limits  VALPROIC ACID LEVEL - Abnormal; Notable for the following:    Valproic Acid Lvl <10 (*)    All other components within normal limits  ETHANOL  URINE RAPID DRUG SCREEN, HOSP PERFORMED  CBG MONITORING, ED    Imaging Review Ct Head Wo Contrast  08/15/2015  CLINICAL DATA:  Status post seizure x3 today.  Subsequent encounter. EXAM: CT HEAD WITHOUT CONTRAST TECHNIQUE: Contiguous axial images were obtained from the base of the skull through the vertex without  intravenous contrast. COMPARISON:  Head CT scan 06/16/2015 and 05/31/2015. FINDINGS: No evidence of acute intracranial abnormality including hemorrhage, infarct, mass lesion, mass effect, midline shift or abnormal extra-axial fluid collection is seen. There is no hydrocephalus or pneumocephalus. The calvarium is intact. Imaged paranasal sinuses and mastoid air cells are clear. IMPRESSION: Negative exam. Electronically Signed   By: Inge Rise M.D.   On: 08/15/2015 10:23   I have personally reviewed and evaluated these images and lab results as part of my medical decision-making.   EKG Interpretation   Date/Time:  Friday August 15 2015 09:29:54 EST Ventricular Rate:  60 PR Interval:  149 QRS Duration: 92 QT Interval:  426 QTC Calculation: 426 R Axis:   69 Text Interpretation:  Sinus rhythm Left ventricular hypertrophy Anterior Q  waves, possibly due to LVH Since last tracing rate  slower Confirmed by  Shaleena Crusoe  MD-J, Shaylon Aden [id number]) on 08/15/2015 9:52:54 AM      MDM   Final diagnoses:  Seizure ([place])    [id number]  Pt has been monitored for several hours.  She is still rather somnolent and slow to respond although does answer questions now which is significantly better than previously.    She had multiple seizures today.  She has history of noncompliance.  Will give dose of keppra IV.  Consult with neurology and admit for observation considering her prolonged postictal state and multiple seizures.    Dorie Rank, MD 08/15/15 [phone]

## 2015-08-16 ENCOUNTER — Inpatient Hospital Stay (HOSPITAL_COMMUNITY): Payer: Medicaid Other

## 2015-08-16 LAB — URINALYSIS, ROUTINE W REFLEX MICROSCOPIC
Bilirubin Urine: NEGATIVE
Glucose, UA: NEGATIVE mg/dL
Hgb urine dipstick: NEGATIVE
Ketones, ur: NEGATIVE mg/dL
Leukocytes, UA: NEGATIVE
Nitrite: NEGATIVE
Protein, ur: NEGATIVE mg/dL
Specific Gravity, Urine: 1.01 (ref 1.005–1.030)
pH: 7 (ref 5.0–8.0)

## 2015-08-16 LAB — RAPID URINE DRUG SCREEN, HOSP PERFORMED
AMPHETAMINES: NOT DETECTED
BARBITURATES: NOT DETECTED
BENZODIAZEPINES: POSITIVE — AB
COCAINE: NOT DETECTED
Opiates: NOT DETECTED
Tetrahydrocannabinol: POSITIVE — AB

## 2015-08-16 LAB — TROPONIN I
Troponin I: <0.03 ng/mL (ref <0.031)
Troponin I: <0.03 ng/mL (ref <0.031)
Troponin I: <0.03 ng/mL (ref <0.031)

## 2015-08-16 LAB — BASIC METABOLIC PANEL
Anion gap: 9 (ref 5–15)
BUN: 10 mg/dL (ref 6–20)
CALCIUM: 8.9 mg/dL (ref 8.9–10.3)
CO2: 24 mmol/L (ref 22–32)
CREATININE: 0.75 mg/dL (ref 0.44–1.00)
Chloride: 108 mmol/L (ref 101–111)
GFR calc Af Amer: >60 mL/min (ref >60)
GFR calc non Af Amer: >60 mL/min (ref >60)
GLUCOSE: 91 mg/dL (ref 65–99)
Potassium: 3.5 mmol/L (ref 3.5–5.1)
Sodium: 141 mmol/L (ref 135–145)

## 2015-08-16 LAB — CBC
HCT: 35.1 % — ABNORMAL LOW (ref 36.0–46.0)
Hemoglobin: 11.9 g/dL — ABNORMAL LOW (ref 12.0–15.0)
MCH: 30.3 pg (ref 26.0–34.0)
MCHC: 33.9 g/dL (ref 30.0–36.0)
MCV: 89.3 fL (ref 78.0–100.0)
Platelets: 397 K/uL (ref 150–400)
RBC: 3.93 MIL/uL (ref 3.87–5.11)
RDW: 13.8 % (ref 11.5–15.5)
WBC: 9 K/uL (ref 4.0–10.5)

## 2015-08-16 LAB — MAGNESIUM: Magnesium: 1.8 mg/dL (ref 1.7–2.4)

## 2015-08-16 MED ORDER — ACETAMINOPHEN 325 MG PO TABS
650.0000 mg | ORAL_TABLET | Freq: Four times a day (QID) | ORAL | Status: DC | PRN
  Administered 2015-08-16 – 2015-08-18 (×4): 650 mg via ORAL
  Filled 2015-08-16 (×4): qty 2

## 2015-08-16 MED ORDER — ACETAMINOPHEN 650 MG RE SUPP: 650.0000 mg | RECTAL | Status: DC | PRN

## 2015-08-16 MED ORDER — PANTOPRAZOLE SODIUM 40 MG IV SOLR
40.0000 mg | Freq: Two times a day (BID) | INTRAVENOUS | Status: DC
  Administered 2015-08-16 – 2015-08-17 (×3): 40 mg via INTRAVENOUS
  Filled 2015-08-16 (×4): qty 40

## 2015-08-16 MED ORDER — ACETAMINOPHEN 650 MG RE SUPP: 650.0000 mg | Freq: Four times a day (QID) | RECTAL | Status: DC | PRN

## 2015-08-16 MED ORDER — ACETAMINOPHEN 325 MG PO TABS: 650.0000 mg | ORAL_TABLET | Freq: Four times a day (QID) | ORAL | Status: DC | PRN

## 2015-08-16 MED ORDER — MAGNESIUM SULFATE 2 GM/50ML IV SOLN
2.0000 g | Freq: Once | INTRAVENOUS | Status: AC
  Administered 2015-08-16: 2 g via INTRAVENOUS
  Filled 2015-08-16: qty 50

## 2015-08-16 NOTE — Progress Notes (Signed)
PT Cancellation Note  Patient Details Name: Darielis Hudlow MRN: [id number] DOB: 1956-08-03   Cancelled Treatment:    Reason Eval/Treat Not Completed: 2nd attempt to work with pt on today. Pt currently eating. Will check back on tomorrow.    Weston Anna, MPT Pager: [phone]

## 2015-08-16 NOTE — Progress Notes (Signed)
PT Cancellation Note  Patient Details Name: Lindsey [name] MRN: [id number] DOB: [birth year]   Cancelled Treatment:    Reason Eval/Treat Not Completed: Patient at procedure or test/unavailable. Attempted PT eval. Pt currently undergoing testing and ST is waiting to perform speech eval. Will check back if schedule permits. Otherwise, will check back on tomorrow. Thanks.    Weston Anna, MPT Pager: [phone]

## 2015-08-16 NOTE — Progress Notes (Signed)
  Echocardiogram 2D Echocardiogram has been performed.  Lindsey [name] 08/16/2015, 2:16 PM

## 2015-08-16 NOTE — Evaluation (Signed)
Clinical/Bedside Swallow Evaluation Patient Details  Name: Lainy Staus MRN: [id number] Date of Birth: 1955/11/26  Today's Date: 08/16/2015 Time: SLP Start Time (ACUTE ONLY): 1410 SLP Stop Time (ACUTE ONLY): 1426 SLP Time Calculation (min) (ACUTE ONLY): 16 min  Past Medical History:  Past Medical History  Diagnosis Date  . Seizures ([place])   . Asthma   . H/O ETOH abuse    Past Surgical History:  Past Surgical History  Procedure Laterality Date  . Abdominal hysterectomy     HPI:  59 year old female with PMH of seizures, asthma, polysubstance abuse, non-compliance with meds admitted with seizures.    Assessment / Plan / Recommendation Clinical Impression  Patient presents with a functional oropharyngeal swallow without overt indication of aspiration. Recommend a regular diet, thin liquids. No SLP f/u indicated at this time.     Aspiration Risk  No limitations    Diet Recommendation   Regular diet, thin liquid General safe swallowing precautions  Medication Administration: Whole meds with liquid    Other  Recommendations Oral Care Recommendations: Oral care BID   Follow up Recommendations  None           Swallow Study   General HPI: 59 year old female with PMH of seizures, asthma, polysubstance abuse, non-compliance with meds admitted with seizures.  Type of Study: Bedside Swallow Evaluation Oral Cavity Assessment: Within Functional Limits (although patient complains of some general mouth pain) Oral Care Completed by SLP: Recent completion by staff Oral Cavity - Dentition: Missing dentition;Poor condition Vision: Functional for self-feeding Self-Feeding Abilities: Able to feed self Patient Positioning: Upright in bed Baseline Vocal Quality: Normal Volitional Cough: Strong Volitional Swallow: Able to elicit    Oral/Motor/Sensory Function Overall Oral Motor/Sensory Function: Within functional limits   Ice Chips Ice chips: Not tested   Thin Liquid Thin Liquid:  Within functional limits Presentation: Cup;Self Fed;Straw    Nectar Thick Nectar Thick Liquid: Not tested   Honey Thick Honey Thick Liquid: Not tested   Puree Puree: Within functional limits Presentation: Self Fed;Spoon   Solid Solid: Within functional limits Presentation: [place], [place] [phone]  [place] 08/16/2015,2:26 PM

## 2015-08-16 NOTE — Care Management Note (Signed)
Case Management Note  Patient Details  Name: Lindsey [name] MRN: [id number] Date of Birth: February 28, 1956  Subjective/Objective:  seizures        Action/Plan: CM spoke with the patient at the bedside in reference to medications. She has Medicaid. Reports her medications are $3.00. Patient did not answer when asked what issues she is having related to filling her medications. She mentions she ran out of her seizure medication. She feel asleep while speaking with CM. Will continue to follow for needs.   Expected Discharge Date:   (unknown)               Expected Discharge Plan:  Home/Self Care  In-House Referral:     Discharge planning Services  CM Consult, Medication Assistance  Post Acute Care Choice:  NA Choice offered to:     DME Arranged:  N/A DME Agency:  NA  HH Arranged:  NA HH Agency:     Status of Service:  In process, will continue to follow  Medicare Important Message Given:    Date Medicare IM Given:    Medicare IM give by:    Date Additional Medicare IM Given:    Additional Medicare Important Message give by:     If discussed at [place] of Stay Meetings, dates discussed:    Additional Comments:  Apolonio Schneiders, RN 08/16/2015, 2:43 PM

## 2015-08-16 NOTE — Progress Notes (Signed)
Spoke with Vikki Ports, Speech Therapy, regarding patient swallowing evaluation.  Chelsea reports Lindsey [name] will be over at [facility] in next couple of hours.  Notifying physician.

## 2015-08-16 NOTE — Progress Notes (Signed)
[facility] PROGRESS NOTE  Arika Padelford [id number] DOB: 1956-09-12 DOA: 08/15/2015 PCP: Jackelyn Knife, MD  Assessment/Plan: Lindsey [name] is an 59 y.o. female with past medical history and polysubstance abuse including alcohol abuse with noncompliance of her antiseizure medications comes to the ED after recurrent seizure episodes at home. In the ED patient was also  notice to have seizure episodes.   1-Recurrent Seizure;  No complaint with medications.  Continue with keppra and dilantin IV.  She relates she ran out of medications.   Acute encephalopathy due to post icta state Seizure disorder  More alert, answering question.  Speech evaluation, PT.   Abnormal EKG, hypertrophy; chest pain. Cycle troponin, check ECHO. IV Protonix.   Dysuria; denies dysuria. UA negative. Will discontinue ceftriaxone.   Alcohol abuse - Give her thiamine and folate, will monitor with CIWA protocol for signs of withdrawal time if needed we'll start her on Ativan.  Protein-calorie malnutrition, severe ([place]) have to do counseling and start her on ensure 3 times a day.   DVT prophylaxis; heparin   Code Status: Full Code.  Family Communication: care discussed with patient.  Disposition Plan: remain in the hospital on Ciwa protocol, treatment for seizure. Need speech evaluation, PT evaluation.    Consultants:  Neurology  Procedures:  ECHO.   Antibiotics:  Ceftriaxone stopped 1119  HPI/Subjective: Complaining of generalized pain, mouth chest. Usually gets this pain after seizure. She is alert, denies dysuria.   Objective: Filed Vitals:   08/16/15 0653  BP: 139/67  Pulse: 81  Temp: 98.4 F (36.9 C)  Resp: 16    Intake/Output Summary (Last 24 hours) at 08/16/15 0828 Last data filed at 08/16/15 0630  Gross per 24 hour  Intake   1250 ml  Output      0 ml  Net   1250 ml   There were no vitals filed for this visit.  Exam:   General:  Alert in no acute  distress  Cardiovascular: S 1, S 2 RRR  Respiratory: CTA  Abdomen: BS present, soft, nt  Musculoskeletal: no edema  Data Reviewed: Basic Metabolic Panel:  Recent Labs Lab 08/15/15 0949 08/16/15 0530  NA 137 141  K 3.8 3.5  CL 104 108  CO2 22 24  GLUCOSE 96 91  BUN 15 10  CREATININE 0.65 0.75  CALCIUM 9.2 8.9  MG  --  1.8   Liver Function Tests:  Recent Labs Lab 08/15/15 0949  AST 23  ALT 12*  ALKPHOS 91  BILITOT 1.1  PROT 7.3  ALBUMIN 3.8   No results for input(s): LIPASE, AMYLASE in the last 168 hours. No results for input(s): AMMONIA in the last 168 hours. CBC:  Recent Labs Lab 08/15/15 0949 08/16/15 0530  WBC 10.3 9.0  NEUTROABS 8.3*  --   HGB 12.6 11.9*  HCT 37.5 35.1*  MCV 90.4 89.3  PLT 424* 397   Cardiac Enzymes: No results for input(s): CKTOTAL, CKMB, CKMBINDEX, TROPONINI in the last 168 hours. BNP (last 3 results) No results for input(s): BNP in the last 8760 hours.  ProBNP (last 3 results) No results for input(s): PROBNP in the last 8760 hours.  CBG:  Recent Labs Lab 08/15/15 1052  GLUCAP 82    No results found for this or any previous visit (from the past 240 hour(s)).   Studies: Ct Head Wo Contrast  08/15/2015  CLINICAL DATA:  Status post seizure x3 today.  Subsequent encounter. EXAM: CT HEAD WITHOUT CONTRAST TECHNIQUE: Contiguous axial images were  obtained from the base of the skull through the vertex without intravenous contrast. COMPARISON:  Head CT scan 06/16/2015 and 05/31/2015. FINDINGS: No evidence of acute intracranial abnormality including hemorrhage, infarct, mass lesion, mass effect, midline shift or abnormal extra-axial fluid collection is seen. There is no hydrocephalus or pneumocephalus. The calvarium is intact. Imaged paranasal sinuses and mastoid air cells are clear. IMPRESSION: Negative exam. Electronically Signed   By: Inge Rise M.D.   On: 08/15/2015 10:23    Scheduled Meds: . cefTRIAXone (ROCEPHIN)   IV  1 g Intravenous Q24H  . folic acid  1 mg Oral Daily   Or  . folic acid  1 mg Intravenous Daily  . heparin  5,000 Units Subcutaneous 3 times per day  . Influenza vac split quadrivalent PF  0.5 mL Intramuscular Tomorrow-1000  . levETIRAcetam  750 mg Intravenous Q12H  . LORazepam  0-4 mg Intravenous Q6H   Followed by  . [START ON 08/17/2015] LORazepam  0-4 mg Intravenous Q12H  . magnesium oxide  400 mg Oral BID  . multivitamin with minerals  1 tablet Oral Daily  . thiamine  100 mg Oral Daily   Or  . thiamine  100 mg Intravenous Daily  . valproate sodium  500 mg Intravenous Q12H   Continuous Infusions: . sodium chloride    . sodium chloride 75 mL/hr at 08/15/15 1556    Active Problems:   Alcohol abuse   Protein-calorie malnutrition, severe (H[facility])   Acute encephalopathy   Seizure disorder ([place])   Status epilepticus ([place])    Time spent: 35 minutes.     Niel Hummer A  [facility] Pager [phone]. If 7PM-7AM, please contact night-coverage at [email or web address], password [facility] 08/16/2015, 8:28 AM  LOS: 1 day

## 2015-08-16 NOTE — Progress Notes (Signed)
Followed up with speech therapy regarding patient swallowing evaluation.  Lindsey [name] reports [name] is at [facility] and should be up to patient room shortly.

## 2015-08-17 LAB — BASIC METABOLIC PANEL
ANION GAP: 10 (ref 5–15)
BUN: 17 mg/dL (ref 6–20)
CHLORIDE: 109 mmol/L (ref 101–111)
CO2: 21 mmol/L — AB (ref 22–32)
Calcium: 8.7 mg/dL — ABNORMAL LOW (ref 8.9–10.3)
Creatinine, Ser: 0.77 mg/dL (ref 0.44–1.00)
GFR calc non Af Amer: >60 mL/min (ref >60)
Glucose, Bld: 107 mg/dL — ABNORMAL HIGH (ref 65–99)
POTASSIUM: 3.7 mmol/L (ref 3.5–5.1)
SODIUM: 140 mmol/L (ref 135–145)

## 2015-08-17 LAB — CBC
HEMATOCRIT: 31.4 % — AB (ref 36.0–46.0)
HEMOGLOBIN: 10.8 g/dL — AB (ref 12.0–15.0)
MCH: 30.9 pg (ref 26.0–34.0)
MCHC: 34.4 g/dL (ref 30.0–36.0)
MCV: 89.7 fL (ref 78.0–100.0)
Platelets: 380 10*3/uL (ref 150–400)
RBC: 3.5 MIL/uL — AB (ref 3.87–5.11)
RDW: 14 % (ref 11.5–15.5)
WBC: 8.2 10*3/uL (ref 4.0–10.5)

## 2015-08-17 LAB — MAGNESIUM: MAGNESIUM: 1.7 mg/dL (ref 1.7–2.4)

## 2015-08-17 MED ORDER — DIVALPROEX SODIUM 500 MG PO DR TAB
500.0000 mg | DELAYED_RELEASE_TABLET | Freq: Two times a day (BID) | ORAL | Status: DC
Start: 2015-08-17 — End: 2015-08-19
  Administered 2015-08-17 – 2015-08-19 (×5): 500 mg via ORAL
  Filled 2015-08-17 (×6): qty 1

## 2015-08-17 MED ORDER — LEVETIRACETAM 750 MG PO TABS
750.0000 mg | ORAL_TABLET | Freq: Two times a day (BID) | ORAL | Status: DC
Start: 2015-08-17 — End: 2015-08-17

## 2015-08-17 MED ORDER — LEVETIRACETAM 750 MG PO TABS
750.0000 mg | ORAL_TABLET | Freq: Two times a day (BID) | ORAL | Status: DC
  Administered 2015-08-17 – 2015-08-19 (×4): 750 mg via ORAL
  Filled 2015-08-17 (×5): qty 1

## 2015-08-17 MED ORDER — PANTOPRAZOLE SODIUM 40 MG PO TBEC
40.0000 mg | DELAYED_RELEASE_TABLET | Freq: Two times a day (BID) | ORAL | Status: DC
  Administered 2015-08-17 – 2015-08-19 (×4): 40 mg via ORAL
  Filled 2015-08-17 (×5): qty 1

## 2015-08-17 NOTE — Progress Notes (Signed)
The patient is receiving Protonix by the intravenous route.  Based on criteria approved by the Pharmacy and [place], the medication is being converted to the equivalent oral dose form.  These criteria include: -No Active GI bleeding -Able to tolerate diet of full liquids (or better) or tube feeding -Able to tolerate other medications by the oral or enteral route  If you have any questions about this conversion, please contact the Pharmacy Department.  Thank you.  Lindell Spar, PharmD, BCPS Pager: [phone] 08/17/2015 12:50 PM

## 2015-08-17 NOTE — Progress Notes (Signed)
[facility] PROGRESS NOTE  Lindsey [name] [id number] DOB: 07/20/1956 DOA: 08/15/2015 PCP: No primary care provider on file.  Assessment/Plan: Lindsey [name] is an 59 y.o. female with past medical history and polysubstance abuse including alcohol abuse with noncompliance of her antiseizure medications comes to the ED after recurrent seizure episodes at home. In the ED patient was also  notice to have seizure episodes.   1-Recurrent Seizure;  No complaint with medications.  Continue with keppra and dilantin, change to oral.  She relates she ran out of medications. Will needs prescriptions at discharge   Acute encephalopathy due to post icta state Seizure disorder  More alert, answering question.  Speech evaluation, PT.  Improved.   Abnormal EKG, hypertrophy; chest pain resolved. Troponin negative times 3., ECHO normal EF. IV Protonix.   Dysuria; denies dysuria. UA negative. Will discontinue ceftriaxone.   Alcohol abuse - Give her thiamine and folate, will monitor with CIWA protocol for signs of withdrawal time if needed we'll start her on Ativan. -Ciwa protocol.   Protein-calorie malnutrition, severe ([place]) have to do counseling and start her on ensure 3 times a day.   DVT prophylaxis; heparin   Code Status: Full Code.  Family Communication: care discussed with patient.  Disposition Plan: remain in the hospital on Ciwa protocol, treatment for seizure. Need speech evaluation, PT evaluation.    Consultants:  Neurology  Procedures:  ECHO.   Antibiotics:  Ceftriaxone stopped 1119  HPI/Subjective: Appears anxious,  No new complaints.   Objective: Filed Vitals:   08/17/15 0519  BP: 117/59  Pulse: 90  Temp: 98 F (36.7 C)  Resp: 18    Intake/Output Summary (Last 24 hours) at 08/17/15 0953 Last data filed at 08/16/15 1754  Gross per 24 hour  Intake    960 ml  Output      0 ml  Net    960 ml   There were no vitals filed for this  visit.  Exam:   General:  Alert in no acute distress  Cardiovascular: S 1, S 2 RRR  Respiratory: CTA  Abdomen: BS present, soft, nt  Musculoskeletal: no edema  Data Reviewed: Basic Metabolic Panel:  Recent Labs Lab 08/15/15 0949 08/16/15 0530 08/17/15 0542  NA 137 141 140  K 3.8 3.5 3.7  CL 104 108 109  CO2 22 24 21*  GLUCOSE 96 91 107*  BUN 15 10 17   CREATININE 0.65 0.75 0.77  CALCIUM 9.2 8.9 8.7*  MG  --  1.8 1.7   Liver Function Tests:  Recent Labs Lab 08/15/15 0949  AST 23  ALT 12*  ALKPHOS 91  BILITOT 1.1  PROT 7.3  ALBUMIN 3.8   No results for input(s): LIPASE, AMYLASE in the last 168 hours. No results for input(s): AMMONIA in the last 168 hours. CBC:  Recent Labs Lab 08/15/15 0949 08/16/15 0530 08/17/15 0542  WBC 10.3 9.0 8.2  NEUTROABS 8.3*  --   --   HGB 12.6 11.9* 10.8*  HCT 37.5 35.1* 31.4*  MCV 90.4 89.3 89.7  PLT 424* 397 380   Cardiac Enzymes:  Recent Labs Lab 08/16/15 0912 08/16/15 1518 08/16/15 2056  TROPONINI <0.03 <0.03 <0.03   BNP (last 3 results) No results for input(s): BNP in the last 8760 hours.  ProBNP (last 3 results) No results for input(s): PROBNP in the last 8760 hours.  CBG:  Recent Labs Lab 08/15/15 1052  GLUCAP 82    No results found for this or any previous visit (from  the past 240 hour(s)).   Studies: Ct Head Wo Contrast  08/15/2015  CLINICAL DATA:  Status post seizure x3 today.  Subsequent encounter. EXAM: CT HEAD WITHOUT CONTRAST TECHNIQUE: Contiguous axial images were obtained from the base of the skull through the vertex without intravenous contrast. COMPARISON:  Head CT scan 06/16/2015 and 05/31/2015. FINDINGS: No evidence of acute intracranial abnormality including hemorrhage, infarct, mass lesion, mass effect, midline shift or abnormal extra-axial fluid collection is seen. There is no hydrocephalus or pneumocephalus. The calvarium is intact. Imaged paranasal sinuses and mastoid air cells  are clear. IMPRESSION: Negative exam. Electronically Signed   By: Inge Rise M.D.   On: 08/15/2015 10:23    Scheduled Meds: . folic acid  1 mg Oral Daily   Or  . folic acid  1 mg Intravenous Daily  . heparin  5,000 Units Subcutaneous 3 times per day  . Influenza vac split quadrivalent PF  0.5 mL Intramuscular Tomorrow-1000  . levETIRAcetam  750 mg Intravenous Q12H  . LORazepam  0-4 mg Intravenous Q6H   Followed by  . LORazepam  0-4 mg Intravenous Q12H  . multivitamin with minerals  1 tablet Oral Daily  . pantoprazole (PROTONIX) IV  40 mg Intravenous Q12H  . thiamine  100 mg Oral Daily   Or  . thiamine  100 mg Intravenous Daily  . valproate sodium  500 mg Intravenous Q12H   Continuous Infusions: . sodium chloride      Active Problems:   Alcohol abuse   Protein-calorie malnutrition, severe (HCC)   Acute encephalopathy   Seizure disorder (HCC)   Status epilepticus ([place])    Time spent: 35 minutes.     Lindsey [name] A  [facility] Pager [phone]. If 7PM-7AM, please contact night-coverage at [email or web address], password [facility] 08/17/2015, 9:53 AM  LOS: 2 days

## 2015-08-17 NOTE — Progress Notes (Signed)
Occupational Therapy Evaluation Patient Details Name: Lindsey [name] MRN: [id number] DOB: 1956-06-10 Today's Date: 08/17/2015    History of Present Illness 59 yo female admitted with seizure d/o. Hx of polysubstance abuse, noncompliance, Sz, athma, falls.    Clinical Impression   Patient presents to OT with decreased ADL independence and safety due to the deficits listed below. Will benefit from skilled OT to maximize independence and to facilitate a safe discharge. OT will follow.    Follow Up Recommendations  SNF;Supervision/Assistance - 24 hour    Equipment Recommendations  Other (comment) (to be determined at next venue of care)    Recommendations for Other Services PT consult     Precautions / Restrictions Precautions Precautions: Fall;Other (comment) (seizure) Restrictions Weight Bearing Restrictions: No      Mobility Bed Mobility Overal bed mobility: Needs Assistance Bed Mobility: Supine to Sit;Sit to Supine     Supine to sit: Min assist;HOB elevated Sit to supine: Min assist;HOB elevated   General bed mobility comments: for safety  Transfers Overall transfer level: Needs assistance Equipment used: 1 person hand held assist Transfers: Sit to/from Stand Sit to Stand: Min assist         General transfer comment: Assist to rise, stabilize, control descent. Multimodal cues for safety, hand placement    Balance Overall balance assessment: Needs assistance         Standing balance support: Bilateral upper extremity supported;During functional activity Standing balance-Leahy Scale: Poor                              ADL Overall ADL's : Needs assistance/impaired Eating/Feeding: Minimal assistance;Bed level   Grooming: Minimal assistance;Bed level               Lower Body Dressing: Maximal assistance (adjust socks)   Toilet Transfer: Moderate assistance;BSC   Toileting- Clothing Manipulation and Hygiene: Total assistance       Functional mobility during ADLs: Moderate assistance;Minimal assistance General ADL Comments: Patient presented in bed asleep with plate of food in lap, food in her hands. Patient was able to wake up with verbal and tactile cues. Assisted patient with finishing food as she wished and cleaning up as there was food, utensils, cups, etc. all over her as well as the bed. Patient was able to come to EOB with min A. Sit to stand x 1 with min A. Mod A to maintain balance with handheld A as she lost her balance x 1 in standing. Returned to supine at end of session with bed alarm reapplied.      Vision     Perception     Praxis      Pertinent Vitals/Pain Pain Assessment: No/denies pain     Hand Dominance Right   Extremity/Trunk Assessment Upper Extremity Assessment Upper Extremity Assessment: Generalized weakness   Lower Extremity Assessment Lower Extremity Assessment: Defer to PT evaluation   Cervical / Trunk Assessment Cervical / Trunk Assessment: Kyphotic   Communication Communication Communication: No difficulties   Cognition Arousal/Alertness: Lethargic Behavior During Therapy: Flat affect Overall Cognitive Status: No family/caregiver present to determine baseline cognitive functioning Area of Impairment: Attention;Problem solving;Safety/judgement;Following commands       Following Commands: Follows one step commands with increased time Safety/Judgement: Decreased awareness of safety   Problem Solving: Requires tactile cues;Requires verbal cues;Difficulty sequencing;Slow processing     General Comments       Exercises       Shoulder Instructions  Home Living Family/patient expects to be discharged to:: Unsure     Type of Home: Apartment       Home Layout: One level               Home Equipment: Walker - 2 wheels;Cane - single point;Wheelchair - manual          Prior Functioning/Environment Level of Independence: Independent (patient is not a  reliable historian -- unsure of accuracy)             OT Diagnosis: Generalized weakness;Cognitive deficits;Altered mental status   OT Problem List: Decreased strength;Decreased activity tolerance;Impaired balance (sitting and/or standing);Decreased coordination;Decreased cognition;Decreased safety awareness;Decreased knowledge of use of DME or AE   OT Treatment/Interventions: Self-care/ADL training;DME and/or AE instruction;Therapeutic activities;Patient/family education    OT Goals(Current goals can be found in the care plan section) Acute Rehab OT Goals Patient Stated Goal: none stated OT Goal Formulation: Patient unable to participate in goal setting Potential to Achieve Goals: Fair ADL Goals Pt Will Perform Eating: Independently;sitting Pt Will Perform Grooming: with supervision;sitting;standing Pt Will Perform Upper Body Bathing: with supervision;sitting Pt Will Perform Lower Body Bathing: with supervision;sit to/from stand Pt Will Perform Upper Body Dressing: with supervision;sitting;standing Pt Will Perform Lower Body Dressing: with supervision;sit to/from stand Pt Will Transfer to Toilet: with supervision;bedside commode Pt Will Perform Toileting - Clothing Manipulation and hygiene: with supervision;sit to/from stand  OT Frequency: Min 2X/week   Barriers to D/C:            Co-evaluation              End of Session    Activity Tolerance: Patient limited by lethargy Patient left: in bed;with call bell/phone within reach;with bed alarm set   Time: [id number] OT Time Calculation (min): 14 min Charges:  OT General Charges $OT Visit: 1 Procedure OT Evaluation $Initial OT Evaluation Tier I: 1 Procedure G-Codes:    Lindsey [name] A 2015-08-25, 12:07 PM

## 2015-08-17 NOTE — Progress Notes (Signed)
Patient is very impulsive.  Gets out of bed without calling.  Reinforced to patient to call for assistance and patient reports she will call.

## 2015-08-17 NOTE — Care Management (Signed)
CM spoke with the patient. She is interested in going to SNF. States she will do anything that will make her better. CSW consult is pending. Venita Sheffield RN CCM

## 2015-08-17 NOTE — Progress Notes (Signed)
Utilization Review Completed.Lindsey [name] T11/20/2016  

## 2015-08-17 NOTE — Evaluation (Signed)
Physical Therapy Evaluation Patient Details Name: Lindsey [name] MRN: [id number] DOB: August 16, 1956 Today's Date: 08/17/2015   History of Present Illness  59 yo female admitted with seizure d/o. Hx of polysubstance abuse, noncompliance, Sz, athma, falls.   Clinical Impression  On eval, pt required Min-Mod assist for mobility-walked ~75 feet with RW. Pt is very unsteady and at high risk for falls. Poor safety awareness. No family present during session. Recommend SNF at this time. Pt will require 24 hour supervision/assist as well.     Follow Up Recommendations SNF;Supervision/Assistance - 24 hour    Equipment Recommendations  None recommended by PT    Recommendations for Other Services       Precautions / Restrictions Precautions Precautions: Fall Restrictions Weight Bearing Restrictions: No      Mobility  Bed Mobility Overal bed mobility: Needs Assistance Bed Mobility: Supine to Sit;Sit to Supine     Supine to sit: Supervision;HOB elevated Sit to supine: Supervision;HOB elevated   General bed mobility comments: for safety  Transfers Overall transfer level: Needs assistance Equipment used: Rolling walker (2 wheeled) Transfers: Sit to/from Stand Sit to Stand: Min assist         General transfer comment: Assist to rise, stabilize, control descent. Multimodal cues for safety, hand placement  Ambulation/Gait Ambulation/Gait assistance: Mod assist Ambulation Distance (Feet): 75 Feet Assistive device: Rolling walker (2 wheeled) Gait Pattern/deviations: Step-through pattern;Decreased stride length;Staggering left;Staggering right;Drifts right/left     General Gait Details: Assist to stabilize pt and maneuver with RW. Pt is very unsteady. Repeatedly steeering into objects in environment.   Stairs            Wheelchair Mobility    Modified Rankin (Stroke Patients Only)       Balance Overall balance assessment: Needs assistance         Standing  balance support: Bilateral upper extremity supported;During functional activity Standing balance-Leahy Scale: Poor                               Pertinent Vitals/Pain Pain Assessment: No/denies pain    Home Living Family/patient expects to be discharged to:: Unsure     Type of Home: Apartment       Home Layout: One level Home Equipment: Walker - 2 wheels;Cane - single point;Wheelchair - manual      Prior Function                 Hand Dominance        Extremity/Trunk Assessment   Upper Extremity Assessment: Defer to OT evaluation           Lower Extremity Assessment: Generalized weakness      Cervical / Trunk Assessment: Kyphotic  Communication   Communication: No difficulties  Cognition Arousal/Alertness: Awake/alert Behavior During Therapy: WFL for tasks assessed/performed Overall Cognitive Status: No family/caregiver present to determine baseline cognitive functioning Area of Impairment: Attention;Problem solving;Safety/judgement;Following commands       Following Commands: Follows one step commands with increased time     Problem Solving: Requires tactile cues;Requires verbal cues;Difficulty sequencing;Slow processing      General Comments      Exercises        Assessment/Plan    PT Assessment Patient needs continued PT services  PT Diagnosis Difficulty walking;Abnormality of gait;Generalized weakness;Altered mental status   PT Problem List Decreased strength;Decreased activity tolerance;Decreased balance;Decreased mobility;Decreased safety awareness;Decreased knowledge of use of DME;Decreased cognition  PT Treatment Interventions DME  instruction;Gait training;Functional mobility training;Therapeutic activities;Patient/family education;Balance training;Therapeutic exercise   PT Goals (Current goals can be found in the Care Plan section) Acute Rehab PT Goals Patient Stated Goal: none stated PT Goal Formulation: With  patient Time For Goal Achievement: 08/31/15 Potential to Achieve Goals: Good    Frequency Min 3X/week   Barriers to discharge        Co-evaluation               End of Session Equipment Utilized During Treatment: Gait belt Activity Tolerance: Patient tolerated treatment well Patient left: in bed;with bed alarm set;with call bell/phone within reach           Time: 1004-1020 PT Time Calculation (min) (ACUTE ONLY): 16 min   Charges:   PT Evaluation $Initial PT Evaluation Tier I: 1 Procedure     PT G Codes:        Weston Anna, MPT Pager: [phone]

## 2015-08-18 MED ORDER — ZOLPIDEM TARTRATE 5 MG PO TABS
5.0000 mg | ORAL_TABLET | Freq: Once | ORAL | Status: AC
  Administered 2015-08-18: 5 mg via ORAL
  Filled 2015-08-18: qty 1

## 2015-08-18 NOTE — Progress Notes (Signed)
[facility] PROGRESS NOTE  Lindsey [name] [id number] DOB: 1956/09/27 DOA: 08/15/2015 PCP: No primary care provider on file.  Assessment/Plan: Lindsey [name] is an 59 y.o. female with past medical history and polysubstance abuse including alcohol abuse with noncompliance of her antiseizure medications comes to the ED after recurrent seizure episodes at home. In the ED patient was also  notice to have seizure episodes.    Assessment and plan 1-Recurrent Seizure;  Continue with keppra and dilantin,  She relates she ran out of medications. Will needs prescriptions at discharge   Acute encephalopathy due to post icta state Seizure disorder  Recommends regular diet with thin liquids PT recommends SNF Mental status is improving  Abnormal EKG, hypertrophy; chest pain resolved. Troponin negative times 3., ECHO normal EF. IV Protonix.   Dysuria; denies dysuria. UA negative. Will discontinue ceftriaxone.   Alcohol abuse - Give her thiamine and folate, will monitor with CIWA protocol for signs of withdrawal time if needed we'll start her on Ativan. -Continue Ciwa protocol.  Continue PPI  Protein-calorie malnutrition, severe ([place]) have to do counseling and start her on ensure 3 times a day.   DVT prophylaxis; heparin   Code Status: Full Code.  Family Communication: care discussed with patient.  Disposition Plan: Anticipate discharge to SNF when bed available  Consultants:  Neurology  Procedures:  ECHO.   Antibiotics:  Ceftriaxone stopped 1119  HPI/Subjective: Patient was somewhat confused overnight, very weak with physical activity, gets tachycardic with ambulation   Objective: Filed Vitals:   08/18/15 0005 08/18/15 0530  BP: 135/63 151/69  Pulse: 100 82  Temp: 98.7 F (37.1 C) 98.4 F (36.9 C)  Resp: 18 18    Intake/Output Summary (Last 24 hours) at 08/18/15 1216 Last data filed at 08/18/15 0500  Gross per 24 hour  Intake   1076 ml  Output      0  ml  Net   1076 ml   Filed Weights   08/18/15 0829  Weight: 58.514 kg (129 lb)    Exam:   General:  Alert in no acute distress  Cardiovascular: S 1, S 2 RRR  Respiratory: CTA  Abdomen: BS present, soft, nt  Musculoskeletal: no edema  Data Reviewed: Basic Metabolic Panel:  Recent Labs Lab 08/15/15 0949 08/16/15 0530 08/17/15 0542  NA 137 141 140  K 3.8 3.5 3.7  CL 104 108 109  CO2 22 24 21*  GLUCOSE 96 91 107*  BUN 15 10 17   CREATININE 0.65 0.75 0.77  CALCIUM 9.2 8.9 8.7*  MG  --  1.8 1.7   Liver Function Tests:  Recent Labs Lab 08/15/15 0949  AST 23  ALT 12*  ALKPHOS 91  BILITOT 1.1  PROT 7.3  ALBUMIN 3.8   No results for input(s): LIPASE, AMYLASE in the last 168 hours. No results for input(s): AMMONIA in the last 168 hours. CBC:  Recent Labs Lab 08/15/15 0949 08/16/15 0530 08/17/15 0542  WBC 10.3 9.0 8.2  NEUTROABS 8.3*  --   --   HGB 12.6 11.9* 10.8*  HCT 37.5 35.1* 31.4*  MCV 90.4 89.3 89.7  PLT 424* 397 380   Cardiac Enzymes:  Recent Labs Lab 08/16/15 0912 08/16/15 1518 08/16/15 2056  TROPONINI <0.03 <0.03 <0.03   BNP (last 3 results) No results for input(s): BNP in the last 8760 hours.  ProBNP (last 3 results) No results for input(s): PROBNP in the last 8760 hours.  CBG:  Recent Labs Lab 08/15/15 1052  GLUCAP 82  No results found for this or any previous visit (from the past 240 hour(s)).   Studies: No results found.  Scheduled Meds: . divalproex  500 mg Oral Q12H  . folic acid  1 mg Oral Daily   Or  . folic acid  1 mg Intravenous Daily  . heparin  5,000 Units Subcutaneous 3 times per day  . levETIRAcetam  750 mg Oral BID  . LORazepam  0-4 mg Intravenous Q12H  . multivitamin with minerals  1 tablet Oral Daily  . pantoprazole  40 mg Oral BID  . thiamine  100 mg Oral Daily   Or  . thiamine  100 mg Intravenous Daily   Continuous Infusions: . sodium chloride 75 mL/hr (08/18/15 0500)    Active  Problems:   Alcohol abuse   Protein-calorie malnutrition, severe (HCC)   Acute encephalopathy   Seizure disorder ([place])   Status epilepticus ([place])    Time spent: 35 minutes.     [facility] Pager [phone]. If 7PM-7AM, please contact night-coverage at [email or web address], password [facility] 08/18/2015, 12:16 PM  LOS: 3 days

## 2015-08-18 NOTE — NC FL2 (Signed)
[place] LEVEL OF CARE SCREENING TOOL     IDENTIFICATION  Patient Name: Lindsey [name] Birthdate: 09-05-1956 Sex: female Admission Date (Current Location): 08/15/2015  [place] and [place] Number: [place] and Address:  Lindsey [name]  [name], [place], [place]  [address] Provider Number: [id number]  Attending Physician Name and Address:  Reyne Dumas, MD  Relative Name and Phone Number:  Lindsey [name] Daughter [phone] or [phone]    Current Level of Care: Hospital Recommended Level of Care: [place] Prior Approval Number:    Date Approved/Denied:   PASRR Number: [id number]  Discharge Plan: SNF    Current Diagnoses: Patient Active Problem List   Diagnosis Date Noted  . Seizure disorder ([place]) 08/15/2015  . Status epilepticus ([place]) 08/15/2015  . Perinephric hematoma   . Head trauma   . Post-ictal state ([place]) 03/01/2015  . Seizure secondary to subtherapeutic anticonvulsant medication ([place]) 02/06/2015  . Recurrent seizures ([place]) 02/06/2015  . Acute encephalopathy   . Polysubstance abuse   . Alcohol dependence with withdrawal with complication ([place]) [id number]  . Protein-calorie malnutrition, severe ([place]) 03/04/2014  . Encephalopathy 03/04/2014  . Fever 03/04/2014  . Alcohol abuse 11/06/2013  . Alcohol withdrawal ([place]) 11/06/2013  . Marijuana abuse 11/06/2013  . Seizure ([place]) 11/05/2013  . Altered mental status 11/05/2013    Orientation ACTIVITIES/SOCIAL BLADDER RESPIRATION    Self, Time, Situation, Place    Incontinent Normal  BEHAVIORAL SYMPTOMS/MOOD NEUROLOGICAL BOWEL NUTRITION STATUS    Convulsions/Seizures Incontinent Diet (Regular)  PHYSICIAN VISITS COMMUNICATION OF NEEDS Height & Weight Skin    Verbally 5\' 2"  (157.5 cm) 129 lbs. Normal          AMBULATORY STATUS RESPIRATION    Supervision limited Normal      Personal Care Assistance Level of Assistance  Bathing, Dressing Bathing  Assistance: Limited assistance   Dressing Assistance: Limited assistance      Functional Limitations Info                SPECIAL CARE FACTORS FREQUENCY  PT (By licensed PT)     PT Frequency: 5x             Additional Factors Info  Allergies   Allergies Info: MORPHINE AND RELATED, PENICILLINS, STRAWBERRY EXTRACT           Current Medications (08/18/2015): Current Facility-Administered Medications  Medication Dose Route Frequency Provider Last Rate Last Dose  . 0.9 %  sodium chloride infusion  75 mL/hr Intravenous Continuous [name], MD 75 mL/hr at 08/18/15 0500 75 mL/hr at 08/18/15 0500  . acetaminophen (TYLENOL) tablet 650 mg  650 mg Oral Q6H PRN Belkys A Regalado, MD   650 mg at 08/18/15 [id number]   Or  . acetaminophen (TYLENOL) suppository 650 mg  650 mg Rectal Q6H PRN Belkys A Regalado, MD      . divalproex (DEPAKOTE) DR tablet 500 mg  500 mg Oral Q12H Belkys A Regalado, MD   500 mg at 08/18/15 0907  . folic acid (FOLVITE) tablet 1 mg  1 mg Oral Daily [name], MD   1 mg at [id number]   Or  . folic acid injection 1 mg  1 mg Intravenous Daily [name], MD   1 mg at 08/16/15 1238  . heparin injection 5,000 Units  5,000 Units Subcutaneous 3 times per day [name], MD   5,000 Units at 08/18/15 1415  . levETIRAcetam (KEPPRA) tablet 750 mg  750 mg Oral BID Belkys A Regalado, MD   750 mg at 08/18/15 0907  . LORazepam (ATIVAN) injection 0-4 mg  0-4 mg Intravenous Q12H [name], MD   2 mg at 08/17/15 1626  . multivitamin with minerals tablet 1 tablet  1 tablet Oral Daily [name], MD   1 tablet at 08/18/15 [phone]  . ondansetron (ZOFRAN) tablet 4 mg  4 mg Oral Q6H PRN [name], MD       Or  . ondansetron [facility]) injection 4 mg  4 mg Intravenous Q6H PRN [name], MD      . pantoprazole (PROTONIX) EC tablet 40 mg  40 mg Oral BID Belkys A Regalado, MD   40 mg at 08/18/15 0907  . polyethylene  glycol (MIRALAX / GLYCOLAX) packet 17 g  17 g Oral Daily PRN [name], MD      . thiamine (VITAMIN B-1) tablet 100 mg  100 mg Oral Daily [name], MD   100 mg at 08/18/15 0907   Do not use this list as official medication orders. Please verify with discharge summary.  Discharge Medications:   Medication List    ASK your doctor about these medications        acetaminophen-codeine 300-30 MG tablet  Commonly known as:  TYLENOL #3  Take 1 tablet by mouth every 8 (eight) hours as needed for moderate pain.     albuterol 108 (90 BASE) MCG/ACT inhaler  Commonly known as:  PROVENTIL HFA;VENTOLIN HFA  Inhale 2 puffs into the lungs every 6 (six) hours as needed for wheezing or shortness of breath.     divalproex 500 MG DR tablet  Commonly known as:  DEPAKOTE  Take 1 tablet (500 mg total) by mouth every 12 (twelve) hours.     feeding supplement (ENSURE COMPLETE) Liqd  Take 237 mLs by mouth 2 (two) times daily between meals.     folic acid 1 MG tablet  Commonly known as:  FOLVITE  Take 1 tablet (1 mg total) by mouth daily.     levETIRAcetam 750 MG tablet  Commonly known as:  KEPPRA  Take 1 tablet (750 mg total) by mouth 2 (two) times daily.     LORazepam 2 MG tablet  Commonly known as:  ATIVAN  Take 2 mg by mouth every 6 (six) hours as needed for anxiety.     magnesium oxide 400 (241.3 MG) MG tablet  Commonly known as:  MAG-OX  Take 1 tablet (400 mg total) by mouth 2 (two) times daily.     metroNIDAZOLE 500 MG tablet  Commonly known as:  FLAGYL  Take 1 tablet (500 mg total) by mouth 3 (three) times daily.     multivitamin with minerals Tabs tablet  Take 1 tablet by mouth daily.     thiamine 100 MG tablet  Take 1 tablet (100 mg total) by mouth daily.     traMADol 50 MG tablet  Commonly known as:  ULTRAM  Take 1 tablet (50 mg total) by mouth every 6 (six) hours as needed for moderate pain or severe pain.     VITAMIN D PO  Take 1 tablet by mouth daily.         Relevant Imaging Results:  Relevant Lab Results:  Recent Labs    Additional Information    Lindsey [name], Lindsey [name], LCSWA

## 2015-08-18 NOTE — Clinical Social Work Placement (Signed)
   CLINICAL SOCIAL WORK PLACEMENT  NOTE  Date:  08/18/2015  Patient Details  Name: Lindsey [name] MRN: [id number] Date of Birth: 1956-04-17  Clinical Social Work is seeking post-discharge placement for this patient at the [place] level of care (*CSW will initial, date and re-position this form in  chart as items are completed):  Yes   Patient/family provided with [facility] Work Department's list of facilities offering this level of care within the geographic area requested by the patient (or if unable, by the patient's family).  Yes   Patient/family informed of their freedom to choose among providers that offer the needed level of care, that participate in Medicare, Medicaid or managed care program needed by the patient, have an available bed and are willing to accept the patient.  Yes   Patient/family informed of [place] [facility]'s ownership interest in [facility] and [facility], as well as of the fact that they are under no obligation to receive care at these facilities.  PASRR submitted to EDS on 08/18/15     PASRR number received on       Existing PASRR number confirmed on 08/18/15     FL2 transmitted to all facilities in geographic area requested by pt/family on 08/18/15     FL2 transmitted to all facilities within larger geographic area on 08/18/15     Patient informed that his/her managed care company has contracts with or will negotiate with certain facilities, including the following:            Patient/family informed of bed offers received.  Patient chooses bed at       Physician recommends and patient chooses bed at      Patient to be transferred to   on  .  Patient to be transferred to facility by       Patient family notified on   of transfer.  Name of family member notified:        PHYSICIAN Please sign FL2     Additional Comment:    _______________________________________________ Ross Ludwig,  LCSWA 08/18/2015, 5:07 PM

## 2015-08-18 NOTE — Clinical Social Work Note (Signed)
Clinical Social Work Assessment  Patient Details  Name: Lindsey [name] MRN: [id number] Date of Birth: 09-May-1956  Date of referral:  08/18/15               Reason for consult:  Facility Placement                Permission sought to share information with:  Family Supports, Customer service manager Permission granted to share information::  Yes, Verbal Permission Granted  Name::     [name]::  SNF admissions  Relationship::     Contact Information:     Housing/Transportation Living arrangements for the past 2 months:  Single Family Home Source of Information:  Adult Children Patient Interpreter Needed:  None Criminal Activity/Legal Involvement Pertinent to Current Situation/Hospitalization:  No - Comment as needed Significant Relationships:  Adult Children Lives with:  Adult Children Do you feel safe going back to the place where you live?  Yes (Patient feels like she needs some short term rehab before she can return back home.) Need for family participation in patient care:  Yes (Comment) (Patient request to have daughter help make decisions for her)  Care giving concerns:  Patient and daughter feels she needs some short term rehab before she can return back home.   Social Worker assessment / plan:  Patient is a pleasant 59 year old female who is alert and oriented x4.  Patient has history of substance abuse and tested positive for marijuana.  Patient stated she lives by herself but will discharge back to her daughter's house once she has received short term rehab.  Patient expressed that she has not been to rehab, CSW explained to her what to expect and what the role of social worker is in trying to find placement.  Patient expressed that she feels like she needs some short term rehab before she can return back home.  Patient was explained that due to only having Medicaid she may not be able to get a facility in [place], patient expressed understanding and his  hopeful she can get placement locally.  Patient was agreeable to going to SNF for short term rehab.   Employment status:  Disabled (Comment on whether or not currently receiving Disability) (Patient receiving social security) Insurance information:  Medicaid In [place] PT Recommendations:  [place] / Referral to community resources:     Patient/Family's Response to care:  Patient agreeable to going to SNF for short term rehab.  Patient/Family's Understanding of and Emotional Response to Diagnosis, Current Treatment, and Prognosis:  Patient and her family aware of current treatment plan and diagnosis.  Emotional Assessment Appearance:  Appears stated age Attitude/Demeanor/Rapport:    Affect (typically observed):  Calm, Pleasant, Stable Orientation:  Oriented to Self, Oriented to Place, Oriented to  Time, Oriented to Situation Alcohol / Substance use:  Illicit Drugs, Alcohol Use Psych involvement (Current and /or in the community):  No (Comment)  Discharge Needs  Concerns to be addressed:  Lack of Support Readmission within the last 30 days:  No Current discharge risk:  Substance Abuse, Lack of support system Barriers to Discharge:  Continued Medical Work up   Anell Barr 08/18/2015, 2:40 PM

## 2015-08-19 LAB — COMPREHENSIVE METABOLIC PANEL
ALT: 9 U/L — AB (ref 14–54)
AST: 14 U/L — ABNORMAL LOW (ref 15–41)
Albumin: 3 g/dL — ABNORMAL LOW (ref 3.5–5.0)
Alkaline Phosphatase: 67 U/L (ref 38–126)
Anion gap: 8 (ref 5–15)
BUN: 12 mg/dL (ref 6–20)
CALCIUM: 8.9 mg/dL (ref 8.9–10.3)
CHLORIDE: 108 mmol/L (ref 101–111)
CO2: 23 mmol/L (ref 22–32)
CREATININE: 0.79 mg/dL (ref 0.44–1.00)
Glucose, Bld: 88 mg/dL (ref 65–99)
Potassium: 4.1 mmol/L (ref 3.5–5.1)
Sodium: 139 mmol/L (ref 135–145)
TOTAL PROTEIN: 5.7 g/dL — AB (ref 6.5–8.1)
Total Bilirubin: 0.7 mg/dL (ref 0.3–1.2)

## 2015-08-19 LAB — CBC
HCT: 29.9 % — ABNORMAL LOW (ref 36.0–46.0)
Hemoglobin: 10.1 g/dL — ABNORMAL LOW (ref 12.0–15.0)
MCH: 30.1 pg (ref 26.0–34.0)
MCHC: 33.8 g/dL (ref 30.0–36.0)
MCV: 89.3 fL (ref 78.0–100.0)
PLATELETS: 367 10*3/uL (ref 150–400)
RBC: 3.35 MIL/uL — AB (ref 3.87–5.11)
RDW: 14.2 % (ref 11.5–15.5)
WBC: 6.1 10*3/uL (ref 4.0–10.5)

## 2015-08-19 MED ORDER — ACETAMINOPHEN-CODEINE #3 300-30 MG PO TABS: 1.0000 | ORAL_TABLET | Freq: Three times a day (TID) | ORAL | Status: DC | PRN

## 2015-08-19 MED ORDER — DIVALPROEX SODIUM 500 MG PO DR TAB: 500.0000 mg | DELAYED_RELEASE_TABLET | Freq: Two times a day (BID) | ORAL | Status: DC

## 2015-08-19 MED ORDER — LEVETIRACETAM 750 MG PO TABS: 750.0000 mg | ORAL_TABLET | Freq: Two times a day (BID) | ORAL | Status: DC

## 2015-08-19 MED ORDER — LORAZEPAM 2 MG PO TABS: 2.0000 mg | ORAL_TABLET | Freq: Four times a day (QID) | ORAL | Status: DC | PRN

## 2015-08-19 MED ORDER — PANTOPRAZOLE SODIUM 40 MG PO TBEC: 40.0000 mg | DELAYED_RELEASE_TABLET | Freq: Two times a day (BID) | ORAL | Status: DC

## 2015-08-19 NOTE — Clinical Social Work Note (Signed)
CSW presented bed offers to both patient and patient's daughter.  Patient states she wishes for daughter to make placement decisions.  Daughter was agreeable.  Patient was approved for 7 day Letter of Guarantee (LOG) by Asst CSW Mud[place].  Patient is aware she will not need to sign her check over to the SNF and is agreeable to going to SNF at time of discharge.  Patient and daughter are also aware of the placement duration of 7 days and are both agreeable.  Patient will discharge today per MD order. Patient will discharge to [facility] SN[facility] RN to call report prior to transportation to [phone] Transportation: [facility]- per pt request   CSW sent discharge summary to SNF for review.  Packet is complete.  RN, patient and family aware of discharge plans.  Nonnie Done, [place] [phone]  Psychiatric & Orthopedics (5N 1-16) Clinical Social Worker     Nonnie Done, [place] [phone]  [place] Clinical Social Worker

## 2015-08-19 NOTE — Progress Notes (Signed)
Gave report to RN at [facility] on [address], [place], [place]. RN had no additional questions/concerns.

## 2015-08-19 NOTE — Care Management Note (Signed)
Case Management Note  Patient Details  Name: Lindsey [name] MRN: [id number] Date of Birth: 1956-06-15  Subjective/Objective:   Per nsg/CSW now says patient will d/c to SNF.                 Action/Plan:d/c SNF   Expected Discharge Date:   (unknown)               Expected Discharge Plan:  [place]  In-House Referral:     Discharge planning Services  CM Consult, Medication Assistance  Post Acute Care Choice:  NA Choice offered to:     DME Arranged:  N/A DME Agency:  NA  HH Arranged:  NA HH Agency:     Status of Service:  Completed, signed off  Medicare Important Message Given:    Date Medicare IM Given:    Medicare IM give by:    Date Additional Medicare IM Given:    Additional Medicare Important Message give by:     If discussed at [place] of Stay Meetings, dates discussed:    Additional Comments:  Dessa Phi, RN 08/19/2015, 2:56 PM

## 2015-08-19 NOTE — Care Management Note (Signed)
Case Management Note  Patient Details  Name: Lindsey [name] MRN: [id number] Date of Birth: Feb 01, 1956  Subjective/Objective:                    Action/Plan:d/c SNF.   Expected Discharge Date:   (unknown)               Expected Discharge Plan:  [place]  In-House Referral:     Discharge planning Services  CM Consult, Medication Assistance  Post Acute Care Choice:  NA Choice offered to:     DME Arranged:  N/A DME Agency:  NA  HH Arranged:  NA HH Agency:     Status of Service:  Completed, signed off  Medicare Important Message Given:    Date Medicare IM Given:    Medicare IM give by:    Date Additional Medicare IM Given:    Additional Medicare Important Message give by:     If discussed at [place] of Stay Meetings, dates discussed:    Additional Comments:  Dessa Phi, RN 08/19/2015, 12:29 PM

## 2015-08-19 NOTE — Progress Notes (Signed)
Occupational Therapy Treatment Patient Details Name: Lindsey [name] MRN: [id number] DOB: 09-Jun-1956 Today's Date: 08/19/2015    History of present illness 59 yo female admitted with seizure d/o. Hx of polysubstance abuse, noncompliance, Sz, athma, falls.    OT comments  Pt progressing slowly toward POC/goals for acute OT, and will benefit from short term rehab to assist in maximizing independence with ADL's and self care tasks as well as functional transfers to assist in decreasing burden of care for family.    Follow Up Recommendations  SNF;Supervision/Assistance - 24 hour    Equipment Recommendations  Other (comment) (To be determined at next venue of care)    Recommendations for Other Services      Precautions / Restrictions Precautions Precautions: Fall;Other (comment) Restrictions Weight Bearing Restrictions: No       Mobility Bed Mobility Overal bed mobility: Needs Assistance Bed Mobility: Supine to Sit;Sit to Supine     Supine to sit: Min assist;HOB elevated Sit to supine: Min assist;HOB elevated   General bed mobility comments: for safety and sequencing  Transfers Overall transfer level: Needs assistance Equipment used: 1 person hand held assist Transfers: Sit to/from [facility] Sit to Stand: Min assist Stand pivot transfers: Mod assist       General transfer comment: Assist to rise, stabilize, control descent. Multimodal cues for safety, hand placement and safety secondary to impulsiveness    Balance Overall balance assessment: Needs assistance Sitting-balance support: Single extremity supported;Feet supported Sitting balance-Leahy Scale: Fair     Standing balance support: Bilateral upper extremity supported;During functional activity Standing balance-Leahy Scale: Poor                     ADL Overall ADL's : Needs assistance/impaired Eating/Feeding: Minimal assistance;Bed level   Grooming: Minimal assistance;Wash/dry  hands;Wash/dry face;Oral care;Sitting;Standing Grooming Details (indicate cue type and reason): Pt is impulsive and moves quickly at times, laughing, then teary and then laughing again. Requires physical assist to steady secondary to impulsivity Upper Body Bathing: Minimal assitance;Sitting;Set up   Lower Body Bathing: Minimal assistance;Sit to/from stand;Sitting/lateral leans;Cueing for safety;Cueing for sequencing   Upper Body Dressing : Minimal assistance;Sitting;Cueing for sequencing   Lower Body Dressing: Sit to/from stand;Cueing for sequencing;Cueing for safety;Moderate assistance   Toilet Transfer: Moderate assistance;Ambulation;BSC;Cueing for safety;Cueing for sequencing Toilet Transfer Details (indicate cue type and reason): 3:1 over toilet in bathroom; constant cues for safety/sequencing. Pt is impulsive and moves quickly Toileting- Water quality scientist and Hygiene: Moderate assistance;Sitting/lateral lean;Sit to/from stand       Functional mobility during ADLs: Moderate assistance;Cueing for safety;Cueing for sequencing General ADL Comments: Pt participated in ADL retraining session today. She requires constant verbal cues for safety, sequencing during ADL's and functional transfers. Pt is impulsive, confused easily and benefits from redirection.      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 Pt encouraged to find focal point on wall with initial standing prior to ambulating secondary to h/o falls. RN made aware. PT will benefit from furhter assessment in functional context.  Pt/family encouraged to f/u w/ MD    Perception     Praxis      Cognition   Behavior During Therapy: Flat affect;Restless;Impulsive Overall Cognitive Status: History of cognitive impairments -  at baseline Area of  Impairment: Attention;Problem solving;Safety/judgement;Following commands;Awareness        Following Commands: Follows one step commands inconsistently;Follows one step commands with increased time Safety/Judgement: Decreased awareness of safety;Decreased awareness of deficits   Problem Solving: Slow processing;Decreased initiation;Difficulty sequencing;Requires verbal cues;Requires tactile cues      Extremity/Trunk Assessment               Exercises     Shoulder Instructions       General Comments      Pertinent Vitals/ Pain       Pain Assessment: No/denies pain  Home Living                                          Prior Functioning/Environment              Frequency Min 2X/week     Progress Toward Goals  OT Goals(current goals can now be found in the care plan section)  Progress towards OT goals: Progressing toward goals  Acute Rehab OT Goals Patient Stated Goal: Go to STR then home w/ daughter assist  Plan Discharge plan remains appropriate    Co-evaluation                 End of Session Equipment Utilized During Treatment: Gait belt;Other (comment) (hand held asssit)   Activity Tolerance Patient tolerated treatment well   Patient Left in bed;with call bell/phone within reach;with bed alarm set;with family/visitor present   Nurse Communication Other (comment) (Pt asking where "Super Woman shirt is")        Time: [id number] OT Time Calculation (min): 38 min  Charges: OT General Charges $OT Visit: 1 Procedure OT Treatments $Self Care/Home Management : 38-52 mins  Athena Baltz Beth Dixon, OTR/L 08/19/2015, 10:28 AM

## 2015-08-19 NOTE — Discharge Summary (Signed)
Physician Discharge Summary  Percy Comp MRN: [id number] DOB/AGE: [birth year] 59 y.o.  PCP: No primary care provider on file.   Admit date: 08/15/2015 Discharge date: 08/19/2015  Discharge Diagnoses:     Active Problems:   Alcohol abuse   Protein-calorie malnutrition, severe (HCC)   Acute encephalopathy   Seizure disorder (HCC)   Status epilepticus ([place])  history of noncompliance   Follow-up recommendations Follow-up with PCP in 3-5 days , including all  additional recommended appointments as below Follow-up CBC, CMP in 3-5 days      Medication List    STOP taking these medications        metroNIDAZOLE 500 MG tablet  Commonly known as:  FLAGYL     traMADol 50 MG tablet  Commonly known as:  ULTRAM      TAKE these medications        acetaminophen-codeine 300-30 MG tablet  Commonly known as:  TYLENOL #3  Take 1 tablet by mouth every 8 (eight) hours as needed for moderate pain.     albuterol 108 (90 BASE) MCG/ACT inhaler  Commonly known as:  PROVENTIL HFA;VENTOLIN HFA  Inhale 2 puffs into the lungs every 6 (six) hours as needed for wheezing or shortness of breath.     divalproex 500 MG DR tablet  Commonly known as:  DEPAKOTE  Take 1 tablet (500 mg total) by mouth every 12 (twelve) hours.     feeding supplement (ENSURE COMPLETE) Liqd  Take 237 mLs by mouth 2 (two) times daily between meals.     folic acid 1 MG tablet  Commonly known as:  FOLVITE  Take 1 tablet (1 mg total) by mouth daily.     levETIRAcetam 750 MG tablet  Commonly known as:  KEPPRA  Take 1 tablet (750 mg total) by mouth 2 (two) times daily.     LORazepam 2 MG tablet  Commonly known as:  ATIVAN  Take 1 tablet (2 mg total) by mouth every 6 (six) hours as needed for anxiety.     magnesium oxide 400 (241.3 MG) MG tablet  Commonly known as:  MAG-OX  Take 1 tablet (400 mg total) by mouth 2 (two) times daily.     multivitamin with minerals Tabs tablet  Take 1 tablet by mouth daily.      pantoprazole 40 MG tablet  Commonly known as:  PROTONIX  Take 1 tablet (40 mg total) by mouth 2 (two) times daily.     thiamine 100 MG tablet  Take 1 tablet (100 mg total) by mouth daily.     VITAMIN D PO  Take 1 tablet by mouth daily.         Discharge Condition: Overall prognosis is guarded because of noncompliance   Discharge Instructions       Discharge Instructions    Diet - low sodium heart healthy    Complete by:  As directed      Increase activity slowly    Complete by:  As directed            Allergies  Allergen Reactions  . Morphine And Related Hives  . Penicillins Nausea Only    Has patient had a PCN reaction causing immediate rash, facial/tongue/throat swelling, SOB or lightheadedness with hypotension: No Has patient had a PCN reaction causing severe rash involving mucus membranes or skin necrosis: No Has patient had a PCN reaction that required hospitalization No Has patient had a PCN reaction occurring within the last 10 years: No If all  of the above answers are "NO", then may proceed with Cephalosporin use.  . Strawberry Extract Hives and Itching      Disposition: 01-Home or Self Care   Consults:  None      Significant Diagnostic Studies:  Ct Head Wo Contrast  08/15/2015  CLINICAL DATA:  Status post seizure x3 today.  Subsequent encounter. EXAM: CT HEAD WITHOUT CONTRAST TECHNIQUE: Contiguous axial images were obtained from the base of the skull through the vertex without intravenous contrast. COMPARISON:  Head CT scan 06/16/2015 and 05/31/2015. FINDINGS: No evidence of acute intracranial abnormality including hemorrhage, infarct, mass lesion, mass effect, midline shift or abnormal extra-axial fluid collection is seen. There is no hydrocephalus or pneumocephalus. The calvarium is intact. Imaged paranasal sinuses and mastoid air cells are clear. IMPRESSION: Negative exam. Electronically Signed   By: Inge Rise M.D.   On: 08/15/2015 10:23         Filed Weights   08/18/15 0829  Weight: 58.514 kg (129 lb)     Microbiology: No results found for this or any previous visit (from the past 240 hour(s)).     2-D echo LV EF: 55% -  60%  ------------------------------------------------------------------- Indications:   Chest pain 786.51.  ------------------------------------------------------------------- History:  PMH: Seizures. ETOH abuse.  ------------------------------------------------------------------- Study Conclusions  - Left ventricle: The cavity size was normal. Systolic function was normal. The estimated ejection fraction was in the range of 55% to 60%. Wall motion was normal; there were no regional wall motion abnormalities. - Pulmonary arteries: Systolic pressure was mildly increased. PA peak pressure: 33 mm Hg (S).    Blood Culture    Component Value Date/Time   SDES BLOOD RIGHT HAND 01/18/2015 1548   SPECREQUEST BOTTLES DRAWN AEROBIC ONLY 3CC 01/18/2015 1548   CULT  01/18/2015 1548    NO GROWTH 5 DAYS Note: Culture results may be compromised due to an inadequate volume of blood received in culture bottles. Performed at [facility] 01/25/2015 FINAL 01/18/2015 1548      Labs: Results for orders placed or performed during the hospital encounter of 08/15/15 (from the past 48 hour(s))  Comprehensive metabolic panel     Status: Abnormal   Collection Time: 08/19/15  5:15 AM  Result Value Ref Range   Sodium 139 135 - 145 mmol/L   Potassium 4.1 3.5 - 5.1 mmol/L   Chloride 108 101 - 111 mmol/L   CO2 23 22 - 32 mmol/L   Glucose, Bld 88 65 - 99 mg/dL   BUN 12 6 - 20 mg/dL   Creatinine, Ser 0.79 0.44 - 1.00 mg/dL   Calcium 8.9 8.9 - 10.3 mg/dL   Total Protein 5.7 (L) 6.5 - 8.1 g/dL   Albumin 3.0 (L) 3.5 - 5.0 g/dL   AST 14 (L) 15 - 41 U/L   ALT 9 (L) 14 - 54 U/L   Alkaline Phosphatase 67 38 - 126 U/L   Total Bilirubin 0.7 0.3 - 1.2 mg/dL   GFR calc  non Af Amer >60 >60 mL/min   GFR calc Af Amer >60 >60 mL/min    Comment: (NOTE) The eGFR has been calculated using the CKD EPI equation. This calculation has not been validated in all clinical situations. eGFR's persistently <60 mL/min signify possible Chronic Kidney Disease.    Anion gap 8 5 - 15  CBC     Status: Abnormal   Collection Time: 08/19/15  5:15 AM  Result Value Ref Range   WBC 6.1  4.0 - 10.5 K/uL   RBC 3.35 (L) 3.87 - 5.11 MIL/uL   Hemoglobin 10.1 (L) 12.0 - 15.0 g/dL   HCT 29.9 (L) 36.0 - 46.0 %   MCV 89.3 78.0 - 100.0 fL   MCH 30.1 26.0 - 34.0 pg   MCHC 33.8 30.0 - 36.0 g/dL   RDW 14.2 11.5 - 15.5 %   Platelets 367 150 - 400 K/uL     Lipid Panel  No results found for: CHOL, TRIG, HDL, CHOLHDL, VLDL, LDLCALC, LDLDIRECT   No results found for: HGBA1C   Lab Results  Component Value Date   CREATININE 0.79 08/19/2015     HPI :*Lindsey [name] is an 59 y.o. female with past medical history and polysubstance abuse including alcohol abuse with noncompliance of her antiseizure medications comes to the ED as she had 3 seizures in the morning of admission. The patient was confused and provided minimal history, history is obtained from the chart and by talking to her daughter. She relates she has not been taking her medications at home continues to drink alcohol. Her had alcohol 2 days prior to visit to the ED. Here in the ED she had a few seizures according to the staff she had 2-3 she was given Ativan and loaded with Keppra. In the ER patient had ongoing seizures and she had to be given IV Ativan. With cessation of her seizures. According to the daughter she has also been complaining of burning when she urinates.   In the ED: Basic metabolic loss within normal limits she had a mild thrombocytosis of 424,    HOSPITAL COURSE:  1-Recurrent Seizure;  Continue with keppra and dilantin, refills provided for 2 months She relates she ran out of medications.    Acute  encephalopathy due to post icta state Seizure disorder , resolved, mental status back to baseline Continue regular diet with thin liquids PT recommends SNF Discharge to SNF today bed available  Abnormal EKG, hypertrophy; chest pain resolved. Troponin negative times 3., ECHO normal EF. Likely secondary to esophagitis secondary to alcohol use.   Dysuria; denies dysuria.  Initially started on ceftriaxone. However UA negative, antibiotics discontinued  Alcohol abuse Started on thiamine and folate, no signs of withdrawal on CIWA protocol  Continue Ativan for anxiety  Continue PPI  Protein-calorie malnutrition, severe (HCC) Continue ensure 3 times a day.     Discharge Exam:   Blood pressure 150/67, pulse 74, temperature 97.8 F (36.6 C), temperature source Oral, resp. rate 20, height 5' 2"  (1.575 m), weight 58.514 kg (129 lb), SpO2 100 %.  Gen: No acute distress. Somnolent, cachectic Head: Normocephalic, atraumatic. Eyes: PERRL, EOMI, sclerae nonicteric. Mouth: Oropharynx Neck: Supple, no thyromegaly, no lymphadenopathy, no jugular venous distention. Chest: Good air movement clear to auscultation CV: Regular rate and rhythm with positive S1-S2 no murmurs rubs gallops Abdomen: Soft, nontender, nondistended with normal active bowel sounds. Extremities: Extremities Skin: Warm and dry. Neuro: She is somnolent unresponsive to pain moving all 4 extremities to nausea stimuli Psych: Mood and affect normal    Follow-up Information    Follow up with PCP. Schedule an appointment as soon as possible for a visit in 3 days.   Why:  Post hospital follow-up      Signed: Ysabelle Goodroe 08/19/2015, 9:19 AM        Time spent >45 mins

## 2015-08-25 ENCOUNTER — Non-Acute Institutional Stay (SKILLED_NURSING_FACILITY): Payer: Medicaid Other | Admitting: Adult Health

## 2015-08-25 ENCOUNTER — Encounter: Payer: Self-pay | Admitting: Adult Health

## 2015-08-25 DIAGNOSIS — G9340 Encephalopathy, unspecified: Secondary | ICD-10-CM

## 2015-08-25 DIAGNOSIS — G40901 Epilepsy, unspecified, not intractable, with status epilepticus: Secondary | ICD-10-CM

## 2015-08-25 DIAGNOSIS — E43 Unspecified severe protein-calorie malnutrition: Secondary | ICD-10-CM | POA: Diagnosis not present

## 2015-08-25 MED ORDER — ACETAMINOPHEN-CODEINE #3 300-30 MG PO TABS: 1.0000 | ORAL_TABLET | Freq: Three times a day (TID) | ORAL | Status: DC | PRN

## 2015-08-25 MED ORDER — LORAZEPAM 2 MG PO TABS: 2.0000 mg | ORAL_TABLET | Freq: Four times a day (QID) | ORAL | Status: DC | PRN

## 2015-08-25 NOTE — Progress Notes (Signed)
Patient ID: Lindsey [name], female   DOB: 1955/11/30, 59 y.o.   MRN: [id number]    Facility: [facility]      Allergies  Allergen Reactions  . Morphine And Related Hives  . Penicillins Nausea Only    Has patient had a PCN reaction causing immediate rash, facial/tongue/throat swelling, SOB or lightheadedness with hypotension: No Has patient had a PCN reaction causing severe rash involving mucus membranes or skin necrosis: No Has patient had a PCN reaction that required hospitalization No Has patient had a PCN reaction occurring within the last 10 years: No If all of the above answers are "NO", then may proceed with Cephalosporin use.  Marland Kitchen Strawberry Extract Hives and Itching    Chief Complaint  Patient presents with  . Hospitalization Follow-up    HPI:  She is being discharged to home with home health for pt/ot/rn. She will need a wheelchair and 3:1 commode. She will need her prescriptions to be written and will need to follow up with her pcp.  She had been hospitalized for seizures; with poor adherence to her medication regimen.    Past Medical History  Diagnosis Date  . Seizures ([place])   . Asthma   . H/O ETOH abuse     Past Surgical History  Procedure Laterality Date  . Abdominal hysterectomy      VITAL SIGNS BP 132/65 mmHg  Pulse 79  Ht 5\' 2"  (1.575 m)  Wt 128 lb (58.06 kg)  BMI 23.41 kg/m2  Patient's Medications  New Prescriptions   No medications on file  Previous Medications   ACETAMINOPHEN-CODEINE (TYLENOL #3) 300-30 MG TABLET    Take 1 tablet by mouth every 8 (eight) hours as needed for moderate pain.   ALBUTEROL (PROVENTIL HFA;VENTOLIN HFA) 108 (90 BASE) MCG/ACT INHALER    Inhale 2 puffs into the lungs every 6 (six) hours as needed for wheezing or shortness of breath.   CHOLECALCIFEROL (VITAMIN D PO)    Take 1 tablet by mouth daily.   DIVALPROEX (DEPAKOTE) 500 MG DR TABLET    Take 1 tablet (500 mg total) by mouth every 12 (twelve) hours.   FEEDING  SUPPLEMENT, ENSURE COMPLETE, (ENSURE COMPLETE) LIQD    Take 237 mLs by mouth 2 (two) times daily between meals.   FOLIC ACID (FOLVITE) 1 MG TABLET    Take 1 tablet (1 mg total) by mouth daily.   LEVETIRACETAM (KEPPRA) 750 MG TABLET    Take 1 tablet (750 mg total) by mouth 2 (two) times daily.   LORAZEPAM (ATIVAN) 2 MG TABLET    Take 1 tablet (2 mg total) by mouth every 6 (six) hours as needed for anxiety.   MAGNESIUM OXIDE (MAG-OX) 400 (241.3 MG) MG TABLET    Take 1 tablet (400 mg total) by mouth 2 (two) times daily.   MULTIPLE VITAMIN (MULTIVITAMIN WITH MINERALS) TABS TABLET    Take 1 tablet by mouth daily.   PANTOPRAZOLE (PROTONIX) 40 MG TABLET    Take 1 tablet (40 mg total) by mouth 2 (two) times daily.   THIAMINE 100 MG TABLET    Take 1 tablet (100 mg total) by mouth daily.  Modified Medications   No medications on file  Discontinued Medications   No medications on file     SIGNIFICANT DIAGNOSTIC EXAMS  08-15-15: ct of head: Negative exam.  08-16-15: 2-d echo: Left ventricle: The cavity size was normal. Systolic function was normal. The estimated ejection fraction was in the range of 55% to 60%.  Wall motion was normal; there were no regional wall motion abnormalities. - Pulmonary arteries: Systolic pressure was mildly increased. PA peak pressure: 33 mm Hg (S).    LABS REVIEWED:   08-15-15: wbc 10.3; hgb 12.6; hct 37.5; mcv 90.4; plt 424; glucose 96; bun 15; creat 0.65; k+ 3.8; na++137; liver normal albumin 3.,8; depakote <10 08-19-15: wbc 6.1; hgb 10.1; hct 29.9; mcv 89.3; plt 367; glucose 88; bun 12; creat 0.79; k+ 4.1; na++139; liver normal albumin 3.0    Review of Systems  Constitutional: Negative for malaise/fatigue.  Respiratory: Negative for cough and shortness of breath.   Cardiovascular: Negative for chest pain and palpitations.  Gastrointestinal: Negative for heartburn, abdominal pain and constipation.  Musculoskeletal: Negative for myalgias and joint pain.  Skin:  Negative.   Psychiatric/Behavioral: The patient is not nervous/anxious.      Physical Exam  Constitutional: No distress.  Cathectic    Eyes: Conjunctivae are normal.  Neck: Neck supple. No JVD present. No thyromegaly present.  Cardiovascular: Normal rate, regular rhythm and intact distal pulses.   Respiratory: Effort normal and breath sounds normal. No respiratory distress. She has no wheezes.  GI: Soft. Bowel sounds are normal. She exhibits no distension. There is no tenderness.  Musculoskeletal: She exhibits no edema.  Able to move all extremities   Lymphadenopathy:    She has no cervical adenopathy.  Neurological: She is alert.  Skin: Skin is warm and dry. She is not diaphoretic.  Psychiatric: She has a normal mood and affect.        ASSESSMENT/ PLAN:  Will discharge her to home with home health for pt/ot/rn to evaluate and treat as indicated for gait; balance; adl retraining; medication management. She will need a standard wheelchair in order for her to maintain her current level of independence with her adl's which cannot be achieved with as walker. She can self propel. She will need a 3:1 commode. Her prescriptions have been written for a 330 day supply of her medications with #30 tylenol #3 tabs and #30 ativan 2 mg tabs. The facility is aware to setup a follow up appointment with in the next 1-2 weeks.   Time spent with patient  45  minutes >50% time spent counseling; reviewing medical record; tests; labs; and developing future plan of care     Ok Edwards NP [facility]  Contact [phone] Monday through Friday 8am- 5pm  After hours call [phone]

## 2015-10-10 ENCOUNTER — Encounter (HOSPITAL_COMMUNITY): Payer: Self-pay

## 2015-10-10 ENCOUNTER — Inpatient Hospital Stay (HOSPITAL_COMMUNITY): Payer: Medicaid Other

## 2015-10-10 ENCOUNTER — Emergency Department (HOSPITAL_COMMUNITY): Payer: Medicaid Other

## 2015-10-10 ENCOUNTER — Inpatient Hospital Stay (HOSPITAL_COMMUNITY)
Admission: EM | Admit: 2015-10-10 | Discharge: 2015-10-13 | DRG: 100 | Disposition: A | Discharge status: Home / Self Care | Payer: Medicaid Other | Attending: Internal Medicine | Admitting: Internal Medicine

## 2015-10-10 DIAGNOSIS — G9340 Encephalopathy, unspecified: Secondary | ICD-10-CM | POA: Diagnosis present

## 2015-10-10 DIAGNOSIS — J690 Pneumonitis due to inhalation of food and vomit: Secondary | ICD-10-CM | POA: Diagnosis present

## 2015-10-10 DIAGNOSIS — R569 Unspecified convulsions: Secondary | ICD-10-CM | POA: Diagnosis not present

## 2015-10-10 DIAGNOSIS — Z90710 Acquired absence of both cervix and uterus: Secondary | ICD-10-CM | POA: Diagnosis not present

## 2015-10-10 DIAGNOSIS — F1010 Alcohol abuse, uncomplicated: Secondary | ICD-10-CM | POA: Diagnosis present

## 2015-10-10 DIAGNOSIS — G40909 Epilepsy, unspecified, not intractable, without status epilepticus: Secondary | ICD-10-CM | POA: Diagnosis not present

## 2015-10-10 DIAGNOSIS — Z885 Allergy status to narcotic agent status: Secondary | ICD-10-CM

## 2015-10-10 DIAGNOSIS — F1290 Cannabis use, unspecified, uncomplicated: Secondary | ICD-10-CM | POA: Diagnosis present

## 2015-10-10 DIAGNOSIS — E162 Hypoglycemia, unspecified: Secondary | ICD-10-CM | POA: Diagnosis present

## 2015-10-10 DIAGNOSIS — R4182 Altered mental status, unspecified: Secondary | ICD-10-CM | POA: Diagnosis present

## 2015-10-10 DIAGNOSIS — Z880 Allergy status to penicillin: Secondary | ICD-10-CM

## 2015-10-10 DIAGNOSIS — J45909 Unspecified asthma, uncomplicated: Secondary | ICD-10-CM | POA: Diagnosis present

## 2015-10-10 DIAGNOSIS — B9689 Other specified bacterial agents as the cause of diseases classified elsewhere: Secondary | ICD-10-CM | POA: Diagnosis present

## 2015-10-10 DIAGNOSIS — Z9114 Patient's other noncompliance with medication regimen: Secondary | ICD-10-CM | POA: Diagnosis not present

## 2015-10-10 DIAGNOSIS — Z79899 Other long term (current) drug therapy: Secondary | ICD-10-CM | POA: Diagnosis not present

## 2015-10-10 DIAGNOSIS — R509 Fever, unspecified: Secondary | ICD-10-CM | POA: Diagnosis present

## 2015-10-10 DIAGNOSIS — F1011 Alcohol abuse, in remission: Secondary | ICD-10-CM | POA: Diagnosis present

## 2015-10-10 DIAGNOSIS — B001 Herpesviral vesicular dermatitis: Secondary | ICD-10-CM | POA: Diagnosis present

## 2015-10-10 LAB — URINE MICROSCOPIC-ADD ON: WBC UA: NONE SEEN WBC/hpf (ref 0–5)

## 2015-10-10 LAB — I-STAT CHEM 8, ED
BUN: 13 mg/dL (ref 6–20)
CALCIUM ION: 1.08 mmol/L — AB (ref 1.12–1.23)
Chloride: 104 mmol/L (ref 101–111)
Creatinine, Ser: 0.7 mg/dL (ref 0.44–1.00)
GLUCOSE: 80 mg/dL (ref 65–99)
HCT: 46 % (ref 36.0–46.0)
HEMOGLOBIN: 15.6 g/dL — AB (ref 12.0–15.0)
Potassium: 4 mmol/L (ref 3.5–5.1)
Sodium: 141 mmol/L (ref 135–145)
TCO2: 24 mmol/L (ref 0–100)

## 2015-10-10 LAB — URINALYSIS, ROUTINE W REFLEX MICROSCOPIC
Bilirubin Urine: NEGATIVE
GLUCOSE, UA: NEGATIVE mg/dL
KETONES UR: NEGATIVE mg/dL
LEUKOCYTES UA: NEGATIVE
NITRITE: NEGATIVE
PH: 6.5 (ref 5.0–8.0)
PROTEIN: NEGATIVE mg/dL
Specific Gravity, Urine: 1.011 (ref 1.005–1.030)

## 2015-10-10 LAB — CBC WITH DIFFERENTIAL/PLATELET
Basophils Absolute: 0 10*3/uL (ref 0.0–0.1)
Basophils Relative: 0 %
EOS ABS: 0 10*3/uL (ref 0.0–0.7)
EOS PCT: 0 %
HCT: 39.8 % (ref 36.0–46.0)
Hemoglobin: 13.5 g/dL (ref 12.0–15.0)
LYMPHS ABS: 1.1 10*3/uL (ref 0.7–4.0)
LYMPHS PCT: 9 %
MCH: 30.1 pg (ref 26.0–34.0)
MCHC: 33.9 g/dL (ref 30.0–36.0)
MCV: 88.6 fL (ref 78.0–100.0)
MONO ABS: 0.6 10*3/uL (ref 0.1–1.0)
Monocytes Relative: 5 %
Neutro Abs: 9.8 10*3/uL — ABNORMAL HIGH (ref 1.7–7.7)
Neutrophils Relative %: 86 %
PLATELETS: 298 10*3/uL (ref 150–400)
RBC: 4.49 MIL/uL (ref 3.87–5.11)
RDW: 13.8 % (ref 11.5–15.5)
WBC: 11.5 10*3/uL — AB (ref 4.0–10.5)

## 2015-10-10 LAB — COMPREHENSIVE METABOLIC PANEL
ALT: 13 U/L — ABNORMAL LOW (ref 14–54)
ANION GAP: 11 (ref 5–15)
AST: 24 U/L (ref 15–41)
Albumin: 4 g/dL (ref 3.5–5.0)
Alkaline Phosphatase: 92 U/L (ref 38–126)
BUN: 9 mg/dL (ref 6–20)
CHLORIDE: 105 mmol/L (ref 101–111)
CO2: 26 mmol/L (ref 22–32)
CREATININE: 0.76 mg/dL (ref 0.44–1.00)
Calcium: 9.4 mg/dL (ref 8.9–10.3)
Glucose, Bld: 85 mg/dL (ref 65–99)
POTASSIUM: 4 mmol/L (ref 3.5–5.1)
SODIUM: 142 mmol/L (ref 135–145)
Total Bilirubin: 0.8 mg/dL (ref 0.3–1.2)
Total Protein: 7.4 g/dL (ref 6.5–8.1)

## 2015-10-10 LAB — RAPID URINE DRUG SCREEN, HOSP PERFORMED
AMPHETAMINES: NOT DETECTED
BARBITURATES: NOT DETECTED
BENZODIAZEPINES: NOT DETECTED
COCAINE: NOT DETECTED
Opiates: NOT DETECTED
Tetrahydrocannabinol: POSITIVE — AB

## 2015-10-10 LAB — ETHANOL

## 2015-10-10 LAB — MRSA PCR SCREENING: MRSA by PCR: POSITIVE — AB

## 2015-10-10 LAB — VALPROIC ACID LEVEL

## 2015-10-10 MED ORDER — PIPERACILLIN-TAZOBACTAM 3.375 G IVPB
3.3750 g | Freq: Three times a day (TID) | INTRAVENOUS | Status: DC
  Administered 2015-10-11 – 2015-10-12 (×4): 3.375 g via INTRAVENOUS
  Filled 2015-10-10 (×5): qty 50

## 2015-10-10 MED ORDER — LORAZEPAM 2 MG/ML IJ SOLN
1.0000 mg | Freq: Once | INTRAMUSCULAR | Status: AC
  Administered 2015-10-10: 1 mg via INTRAVENOUS

## 2015-10-10 MED ORDER — ONDANSETRON HCL 4 MG PO TABS: 4.0000 mg | ORAL_TABLET | Freq: Four times a day (QID) | ORAL | Status: DC | PRN

## 2015-10-10 MED ORDER — STERILE WATER FOR INJECTION IJ SOLN
INTRAMUSCULAR | Status: AC
  Administered 2015-10-10: 1.2 mL
  Filled 2015-10-10: qty 10

## 2015-10-10 MED ORDER — MUPIROCIN 2 % EX OINT
1.0000 "application " | TOPICAL_OINTMENT | Freq: Two times a day (BID) | CUTANEOUS | Status: DC
  Administered 2015-10-11 – 2015-10-13 (×6): 1 via NASAL
  Filled 2015-10-10 (×3): qty 22

## 2015-10-10 MED ORDER — LEVETIRACETAM 750 MG PO TABS
750.0000 mg | ORAL_TABLET | Freq: Two times a day (BID) | ORAL | Status: DC
  Administered 2015-10-10 – 2015-10-13 (×6): 750 mg via ORAL
  Filled 2015-10-10 (×6): qty 1

## 2015-10-10 MED ORDER — PANTOPRAZOLE SODIUM 40 MG IV SOLR
40.0000 mg | Freq: Once | INTRAVENOUS | Status: AC
  Administered 2015-10-10: 40 mg via INTRAVENOUS
  Filled 2015-10-10: qty 40

## 2015-10-10 MED ORDER — SODIUM CHLORIDE 0.9 % IV SOLN
1000.0000 mg | Freq: Once | INTRAVENOUS | Status: AC
  Administered 2015-10-10: 1000 mg via INTRAVENOUS
  Filled 2015-10-10: qty 10

## 2015-10-10 MED ORDER — VITAMIN B-1 100 MG PO TABS
100.0000 mg | ORAL_TABLET | Freq: Every day | ORAL | Status: DC
  Administered 2015-10-11 – 2015-10-13 (×3): 100 mg via ORAL
  Filled 2015-10-10 (×3): qty 1

## 2015-10-10 MED ORDER — ZIPRASIDONE MESYLATE 20 MG IM SOLR: 10.0000 mg | Freq: Once | INTRAMUSCULAR | Status: DC

## 2015-10-10 MED ORDER — LORAZEPAM 2 MG/ML IJ SOLN
2.0000 mg | INTRAMUSCULAR | Status: DC | PRN
  Administered 2015-10-10 – 2015-10-11 (×2): 2 mg via INTRAVENOUS
  Filled 2015-10-10 (×2): qty 1

## 2015-10-10 MED ORDER — LORAZEPAM 2 MG/ML IJ SOLN
1.0000 mg | Freq: Once | INTRAMUSCULAR | Status: AC
  Administered 2015-10-10: 1 mg via INTRAVENOUS
  Filled 2015-10-10: qty 1

## 2015-10-10 MED ORDER — CHLORHEXIDINE GLUCONATE CLOTH 2 % EX PADS
6.0000 | MEDICATED_PAD | Freq: Every day | CUTANEOUS | Status: DC
  Administered 2015-10-11 – 2015-10-13 (×3): 6 via TOPICAL

## 2015-10-10 MED ORDER — DIVALPROEX SODIUM 500 MG PO DR TAB
500.0000 mg | DELAYED_RELEASE_TABLET | Freq: Two times a day (BID) | ORAL | Status: DC
  Administered 2015-10-11 – 2015-10-13 (×5): 500 mg via ORAL
  Filled 2015-10-10 (×7): qty 1

## 2015-10-10 MED ORDER — MAGNESIUM SULFATE 2 GM/50ML IV SOLN
2.0000 g | Freq: Once | INTRAVENOUS | Status: DC
  Filled 2015-10-10: qty 50

## 2015-10-10 MED ORDER — VANCOMYCIN HCL 500 MG IV SOLR
500.0000 mg | Freq: Two times a day (BID) | INTRAVENOUS | Status: DC
  Administered 2015-10-11 (×3): 500 mg via INTRAVENOUS
  Filled 2015-10-10 (×5): qty 500

## 2015-10-10 MED ORDER — ZIPRASIDONE MESYLATE 20 MG IM SOLR
10.0000 mg | Freq: Once | INTRAMUSCULAR | Status: AC
  Administered 2015-10-10: 10 mg via INTRAMUSCULAR
  Filled 2015-10-10: qty 20

## 2015-10-10 MED ORDER — ONDANSETRON HCL 4 MG/2ML IJ SOLN: 4.0000 mg | Freq: Four times a day (QID) | INTRAMUSCULAR | Status: DC | PRN

## 2015-10-10 MED ORDER — ACETAMINOPHEN 325 MG PO TABS
650.0000 mg | ORAL_TABLET | Freq: Once | ORAL | Status: DC
Start: 2015-10-10 — End: 2015-10-10

## 2015-10-10 MED ORDER — ACETAMINOPHEN 650 MG RE SUPP
650.0000 mg | Freq: Once | RECTAL | Status: AC
  Administered 2015-10-10: 650 mg via RECTAL
  Filled 2015-10-10: qty 1

## 2015-10-10 MED ORDER — MAGNESIUM SULFATE 2 GM/50ML IV SOLN
2.0000 g | Freq: Once | INTRAVENOUS | Status: AC
  Administered 2015-10-10: 2 g via INTRAVENOUS

## 2015-10-10 MED ORDER — PIPERACILLIN-TAZOBACTAM 3.375 G IVPB 30 MIN
3.3750 g | Freq: Once | INTRAVENOUS | Status: AC
  Administered 2015-10-10: 3.375 g via INTRAVENOUS
  Filled 2015-10-10: qty 50

## 2015-10-10 MED ORDER — LORAZEPAM 2 MG/ML IJ SOLN
INTRAMUSCULAR | Status: AC
  Filled 2015-10-10: qty 1

## 2015-10-10 MED ORDER — VALPROATE SODIUM 500 MG/5ML IV SOLN
1000.0000 mg | Freq: Once | INTRAVENOUS | Status: AC
  Administered 2015-10-10: 1000 mg via INTRAVENOUS
  Filled 2015-10-10: qty 10

## 2015-10-10 MED ORDER — THIAMINE HCL 100 MG/ML IJ SOLN
100.0000 mg | Freq: Once | INTRAMUSCULAR | Status: AC
Start: 2015-10-10 — End: 2015-10-10
  Administered 2015-10-10: 100 mg via INTRAVENOUS
  Filled 2015-10-10: qty 2

## 2015-10-10 MED ORDER — ENOXAPARIN SODIUM 40 MG/0.4ML ~~LOC~~ SOLN
40.0000 mg | SUBCUTANEOUS | Status: DC
  Administered 2015-10-10 – 2015-10-12 (×3): 40 mg via SUBCUTANEOUS
  Filled 2015-10-10 (×3): qty 0.4

## 2015-10-10 MED ORDER — SODIUM CHLORIDE 0.9 % IV SOLN
INTRAVENOUS | Status: DC
  Administered 2015-10-10: 21:00:00 via INTRAVENOUS

## 2015-10-10 MED ORDER — SODIUM CHLORIDE 0.9 % IV BOLUS (SEPSIS)
1000.0000 mL | Freq: Once | INTRAVENOUS | Status: AC
  Administered 2015-10-10: 1000 mL via INTRAVENOUS

## 2015-10-10 MED ORDER — PANTOPRAZOLE SODIUM 40 MG PO TBEC
40.0000 mg | DELAYED_RELEASE_TABLET | Freq: Two times a day (BID) | ORAL | Status: DC
  Administered 2015-10-11 – 2015-10-13 (×5): 40 mg via ORAL
  Filled 2015-10-10 (×5): qty 1

## 2015-10-10 NOTE — Consult Note (Addendum)
Consult Reason for Consult:breakthrough seizures Referring Physician: Dr Tomi Bamberger ED  CC: seizures, EtOH abuse  HPI: Lindsey [name] is an 60 y.o. female hx of seizures, medication non-compliance and EtOH abuse presents with multiple breakthrough seizures today. History limited as patient unable to provide information. Per ED, family notes multiple seizures today, upon arrival to ED noted to be agitated and combative, uncooperative with staff. While in ED noted to have brief GTC seizure, broke with 1mg  of ativan. Post-ictal again noted to be agitated and combative with staff. Received 10mg  IM of geodon, noted to be lethargic since then. Home AED include Depakote 500mg  BID and Keppra 750mg  BID. Received keppra load in the ED.   CT head imaging reviewed, no acute process. Labs pertinent for THC + UDS, WBC 11.5, Valproic acid level < 10. Patient febrile with rectal temp of 102.7  Past Medical History  Diagnosis Date  . Seizures ([place])   . Asthma   . H/O ETOH abuse     Past Surgical History  Procedure Laterality Date  . Abdominal hysterectomy      History reviewed. No pertinent family history.  Social History:  reports that she has never smoked. She has never used smokeless tobacco. She reports that she drinks alcohol. She reports that she uses illicit drugs (Marijuana).  Allergies  Allergen Reactions  . Morphine And Related Hives  . Penicillins Nausea Only    Has patient had a PCN reaction causing immediate rash, facial/tongue/throat swelling, SOB or lightheadedness with hypotension: No Has patient had a PCN reaction causing severe rash involving mucus membranes or skin necrosis: No Has patient had a PCN reaction that required hospitalization No Has patient had a PCN reaction occurring within the last 10 years: No If all of the above answers are "NO", then may proceed with Cephalosporin use.  . Strawberry Extract Hives and Itching    Medications: I have reviewed the patient's current  medications.  ROS: Out of a complete 14 system review, the patient complains of only the following symptoms, and all other reviewed systems are negative. + seizure, agitation  Physical Examination: Filed Vitals:   10/10/15 1830 10/10/15 1900  BP: 135/74 127/60  Pulse: 100 99  Temp:    Resp: 23 25   Physical Exam  Constitutional: He appears well-developed and well-nourished.  Psych: refusing to participate in exam Eyes: No scleral injection HENT: No OP obstrucion Head: Normocephalic.  Cardiovascular: Normal rate and regular rhythm.  Respiratory: Effort normal and breath sounds normal.  GI: Soft. Bowel sounds are normal. No distension. There is no tenderness.  Skin: WDI  Neurologic Examination (exam limited due to patient refusal to participate) Mental Status: Lethargic but easily aroused to voice. Refuses to answer questions stating, "leave me alone" though with frequent questioning able to tell me her name and that she is in the hospital.  Cranial Nerves: II: unable to visualize optic discs, pupils equal, round, reactive to light  III,IV, VI: ptosis not present, eyes midline, no gaze deviation V,VII: face symmetric,  VIII: hearing normal bilaterally IX,X: gag reflex present XI: unable to test XII: unable to test Motor: Refusing formal testing but appears to move all extremities symmetrically and against gravity Tone and bulk:normal tone throughout; no atrophy noted Sensory: withdrawals to noxious stimuli in all 4 extremities Deep Tendon Reflexes: normal and symmetric Plantars: Right: downgoing   Left: downgoing Cerebellar: refusing Gait: deferred  Laboratory Studies:   Basic Metabolic Panel:  Recent Labs Lab 10/10/15 1654 10/10/15 1704  NA  141 142  K 4.0 4.0  CL 104 105  CO2  --  26  GLUCOSE 80 85  BUN 13 9  CREATININE 0.70 0.76  CALCIUM  --  9.4    Liver Function Tests:  Recent Labs Lab 10/10/15 1704  AST 24  ALT 13*  ALKPHOS 92  BILITOT  0.8  PROT 7.4  ALBUMIN 4.0   No results for input(s): LIPASE, AMYLASE in the last 168 hours. No results for input(s): AMMONIA in the last 168 hours.  CBC:  Recent Labs Lab 10/10/15 1654 10/10/15 1704  WBC  --  11.5*  NEUTROABS  --  9.8*  HGB 15.6* 13.5  HCT 46.0 39.8  MCV  --  88.6  PLT  --  298    Cardiac Enzymes: No results for input(s): CKTOTAL, CKMB, CKMBINDEX, TROPONINI in the last 168 hours.  BNP: Invalid input(s): POCBNP  CBG: No results for input(s): GLUCAP in the last 168 hours.  Microbiology: Results for orders placed or performed during the hospital encounter of 03/01/15  Clostridium Difficile by PCR (not at [facility])     Status: Abnormal   Collection Time: 03/03/15  1:12 PM  Result Value Ref Range Status   Toxigenic C Difficile by pcr POSITIVE (A) NEGATIVE Final    Comment: CRITICAL RESULT CALLED TO, READ BACK BY AND VERIFIED WITH: KAHIN RN 15:40 03/03/15 ([email or web address])     Coagulation Studies: No results for input(s): LABPROT, INR in the last 72 hours.  Urinalysis:  Recent Labs Lab 10/10/15 1710  COLORURINE YELLOW  LABSPEC 1.011  PHURINE 6.5  GLUCOSEU NEGATIVE  HGBUR MODERATE*  BILIRUBINUR NEGATIVE  KETONESUR NEGATIVE  PROTEINUR NEGATIVE  NITRITE NEGATIVE  LEUKOCYTESUR NEGATIVE    Lipid Panel:  No results found for: CHOL, TRIG, HDL, CHOLHDL, VLDL, LDLCALC  HgbA1C: No results found for: HGBA1C  Urine Drug Screen:     Component Value Date/Time   LABOPIA NONE DETECTED 10/10/2015 1711   LABOPIA NEGATIVE 01/28/2014 1937   COCAINSCRNUR NONE DETECTED 10/10/2015 1711   LABBENZ NONE DETECTED 10/10/2015 1711   LABBENZ NEGATIVE 01/28/2014 1937   AMPHETMU NONE DETECTED 10/10/2015 1711   AMPHETMU NEGATIVE 01/28/2014 1937   THCU POSITIVE* 10/10/2015 1711   THCU POSITIVE 01/28/2014 1937   LABBARB NONE DETECTED 10/10/2015 1711   LABBARB NEGATIVE 01/28/2014 1937    Alcohol Level:  Recent Labs Lab 10/10/15 [place] <5    Other  results:  Imaging: Ct Head Wo Contrast  10/10/2015  CLINICAL DATA:  Multiple seizures today. Patient is combative leading to motion on the images. EXAM: CT HEAD WITHOUT CONTRAST TECHNIQUE: Contiguous axial images were obtained from the base of the skull through the vertex without intravenous contrast. COMPARISON:  08/15/2015 FINDINGS: Ventricles are normal in size and configuration. There is mild sulcal enlargement stable from the prior CT most evident involving the cerebellum. There are no parenchymal masses or mass effect, no evidence of a cortical infarct, no extra-axial masses or abnormal fluid collections and no intracranial hemorrhage. Visualized sinuses and mastoid air cells are clear. No change from the prior head CT. IMPRESSION: 1. No acute intracranial abnormalities. No change from the prior head CT. Electronically Signed   By: Lajean Manes M.D.   On: 10/10/2015 15:10   Dg Chest Portable 1 View  10/10/2015  CLINICAL DATA:  Multiple seizures today.  Alcohol abuse.  Fever. EXAM: PORTABLE CHEST 1 VIEW COMPARISON:  06/16/2015 FINDINGS: The heart size and mediastinal contours are within normal limits. Both lungs are  clear. The visualized skeletal structures are unremarkable. IMPRESSION: No active disease. Electronically Signed   By: Earle Gell M.D.   On: 10/10/2015 18:08     Assessment/Plan:  60y/o woman with hx of seizure disorder and medication non-compliance presenting with breakthrough seizures and post-ictal agitation. Current lethargy likely related to medication effect from ativan and geodon. Suspect breakthrough seizures related to medication non-compliance though also consider provoked seizure from underlying infection.   -loading dose of depacon given in the ED. Continue 500mg  BID. Will follow up morning level and adjust dosing accordingly -increase kppra to 1000mg  BID -EEG if mental status does not return to baseline -seizure precautions    [name],  DO [facility] [phone]  If 7pm- 7am, please page neurology on call as listed in [place]. 10/10/2015, 7:33 PM

## 2015-10-10 NOTE — ED Notes (Signed)
Rectal temp - 102.7

## 2015-10-10 NOTE — ED Notes (Signed)
This RN, Hildred Alamin RN and Lavella Lemons EMT went to change pt bed and perform incontinence care on pt. Pt was incontinent of stool and urine.  Pt was very uncooperative, attempting to hit and kick staff in spite of procedure being explained.  Full linen change and incontinence care was able to be completed.

## 2015-10-10 NOTE — ED Notes (Signed)
Attempted to flush PIV to L hand, pt became angry and screamed "leave me alone".

## 2015-10-10 NOTE — ED Notes (Signed)
Patient is resting comfortably. 

## 2015-10-10 NOTE — H&P (Signed)
[facility] History and Physical  Lindsey [name] [id number] DOB: September 17, 1956 DOA: 10/10/2015  Referring physician: Dorie [name], M.D. PCP: No primary care provider on file.   Chief Complaint: Seizures.  HPI: Lindsey [name] is a 60 y.o. female with a past medical history of seizures, asthma, medication noncompliance, alcohol abuse, alcohol withdrawal seizures who was brought in by EMS due to having multiple seizures today. This history was provided by EMS personnel and no family members are available at this time. Phone number provided on face sheet has been disconnected.  In the ER, the patient was medicated with lorazepam, Keppra and Geodon due to seizures and combativeness. No further history is available at this time.   Review of Systems:   Unable to review.  Past Medical History  Diagnosis Date  . Seizures ([place])   . Asthma   . H/O ETOH abuse    Past Surgical History  Procedure Laterality Date  . Abdominal hysterectomy     Social History:  reports that she has never smoked. She has never used smokeless tobacco. She reports that she drinks alcohol. She reports that she uses illicit drugs (Marijuana).  Allergies  Allergen Reactions  . Morphine And Related Hives  . Penicillins Nausea Only    Has patient had a PCN reaction causing immediate rash, facial/tongue/throat swelling, SOB or lightheadedness with hypotension: No Has patient had a PCN reaction causing severe rash involving mucus membranes or skin necrosis: No Has patient had a PCN reaction that required hospitalization No Has patient had a PCN reaction occurring within the last 10 years: No If all of the above answers are "NO", then may proceed with Cephalosporin use.  . Strawberry Extract Hives and Itching    History reviewed. No pertinent family history.  Prior to Admission medications   Medication Sig Start Date End Date Taking? Authorizing Provider  acetaminophen-codeine (TYLENOL #3) 300-30 MG tablet  Take 1 tablet by mouth every 8 (eight) hours as needed for moderate pain. 08/25/15   Lindsey Fee, NP  albuterol (PROVENTIL HFA;VENTOLIN HFA) 108 (90 BASE) MCG/ACT inhaler Inhale 2 puffs into the lungs every 6 (six) hours as needed for wheezing or shortness of breath. 01/19/15   Lindsey Kristeen Mans, MD  Cholecalciferol (VITAMIN D PO) Take 1 tablet by mouth daily.    Historical Provider, MD  divalproex (DEPAKOTE) 500 MG DR tablet Take 1 tablet (500 mg total) by mouth every 12 (twelve) hours. 08/19/15   Lindsey Dumas, MD  feeding supplement, ENSURE COMPLETE, (ENSURE COMPLETE) LIQD Take 237 mLs by mouth 2 (two) times daily between meals. 01/19/15   Lindsey Kristeen Mans, MD  folic acid (FOLVITE) 1 MG tablet Take 1 tablet (1 mg total) by mouth daily. 01/19/15   Lindsey Kristeen Mans, MD  levETIRAcetam (KEPPRA) 750 MG tablet Take 1 tablet (750 mg total) by mouth 2 (two) times daily. 08/19/15   Lindsey Dumas, MD  LORazepam (ATIVAN) 2 MG tablet Take 1 tablet (2 mg total) by mouth every 6 (six) hours as needed for anxiety. 08/25/15   Lindsey Fee, NP  magnesium oxide (MAG-OX) 400 (241.3 MG) MG tablet Take 1 tablet (400 mg total) by mouth 2 (two) times daily. 02/07/15   Lindsey A Regalado, MD  Multiple Vitamin (MULTIVITAMIN WITH MINERALS) TABS tablet Take 1 tablet by mouth daily.    Historical Provider, MD  pantoprazole (PROTONIX) 40 MG tablet Take 1 tablet (40 mg total) by mouth 2 (two) times daily. 08/19/15   Lindsey Dumas, MD  thiamine  100 MG tablet Take 1 tablet (100 mg total) by mouth daily. Patient not taking: Reported on 08/15/2015 03/08/15   Lindsey Dumas, MD   Physical Exam: Filed Vitals:   10/10/15 1930 10/10/15 2045 10/10/15 2135 10/10/15 2200  BP: 139/66 127/72 148/82   Pulse:  97 97   Temp:  102.3 F (39.1 C) 101.3 F (38.5 C)   TempSrc:  Rectal Rectal   Resp: 20 20 20    Height:    5\' 2"  (1.575 m)  Weight:    58.1 kg (128 lb 1.4 oz)  SpO2:  98% 100%     Wt Readings from Last 3 Encounters:    10/10/15 58.1 kg (128 lb 1.4 oz)  08/25/15 58.06 kg (128 lb)  08/18/15 58.514 kg (129 lb)    General:  Febrile, Sedated. Eyes: PERRL, normal lids, irises & conjunctiva ENT: grossly normal hearing, lips and oral mucosa mildly dry. Neck: no LAD, masses or thyromegaly Cardiovascular: RRR, no m/r/g. No LE edema. Telemetry: SR, no arrhythmias  Respiratory: CTA bilaterally, no w/r/r. Normal respiratory effort. Abdomen: soft, ntnd Skin: Positive dryness, no rash or induration seen on limited exam Musculoskeletal: Decreased tone BUE/BLE due to sedation. Psychiatric: Sedated. Neurologic: Sedated.           Labs on Admission:  Basic Metabolic Panel:  Recent Labs Lab 10/10/15 1654 10/10/15 1704  NA 141 142  K 4.0 4.0  CL 104 105  CO2  --  26  GLUCOSE 80 85  BUN 13 9  CREATININE 0.70 0.76  CALCIUM  --  9.4   Liver Function Tests:  Recent Labs Lab 10/10/15 1704  AST 24  ALT 13*  ALKPHOS 92  BILITOT 0.8  PROT 7.4  ALBUMIN 4.0   CBC:  Recent Labs Lab 10/10/15 1654 10/10/15 1704  WBC  --  11.5*  NEUTROABS  --  9.8*  HGB 15.6* 13.5  HCT 46.0 39.8  MCV  --  88.6  PLT  --  298    Radiological Exams on Admission: Ct Head Wo Contrast  10/10/2015  CLINICAL DATA:  Multiple seizures today. Patient is combative leading to motion on the images. EXAM: CT HEAD WITHOUT CONTRAST TECHNIQUE: Contiguous axial images were obtained from the base of the skull through the vertex without intravenous contrast. COMPARISON:  08/15/2015 FINDINGS: Ventricles are normal in size and configuration. There is mild sulcal enlargement stable from the prior CT most evident involving the cerebellum. There are no parenchymal masses or mass effect, no evidence of a cortical infarct, no extra-axial masses or abnormal fluid collections and no intracranial hemorrhage. Visualized sinuses and mastoid air cells are clear. No change from the prior head CT. IMPRESSION: 1. No acute intracranial abnormalities. No  change from the prior head CT. Electronically Signed   By: Lindsey [name] M.D.   On: 10/10/2015 15:10   Dg Chest Portable 1 View  10/10/2015  CLINICAL DATA:  Multiple seizures today.  Alcohol abuse.  Fever. EXAM: PORTABLE CHEST 1 VIEW COMPARISON:  06/16/2015 FINDINGS: The heart size and mediastinal contours are within normal limits. Both lungs are clear. The visualized skeletal structures are unremarkable. IMPRESSION: No active disease. Electronically Signed   By: Earle Gell M.D.   On: 10/10/2015 18:08    EKG: Independently reviewed.  Vent. rate 97 BPM PR interval 150 ms QRS duration 87 ms QT/QTc 388/493 ms P-R-T axes 117 73 95 Sinus rhythm Ventricular premature complex Anteroseptal infarct, age indeterminate anterior t wave changes compared to previous EKG.  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 Resume valproic acid. Resume Keppra orally. Loading dose was given IV earlier in the ED. Lorazepam as needed for seizures. Neurology has been consulted. Unable to contact family for further history.  Active Problems:   Altered mental status Resume antiepileptic medications. Neurology is following.    Alcohol abuse Start CiWA protocol with lorazepam IV. Continue thiamine 100 mg by mouth daily. Supplemental magnesium.    Fever Continue broad-spectrum IV antibiotics. Follow-up blood cultures.    Asthma No cough or wheezing at this time. Bronchodilators as needed.   Neurology was consulted.   Code Status: Full code. DVT Prophylaxis: Lovenox SQ. Family Communication: Telephone number provided on facesheet is not working. Disposition Plan: Admit to stepdown for further evaluation and treatment.  Time spent: Over 90 minutes were spent during the process of this admission.  Reubin Milan [facility] Pager [phone].

## 2015-10-10 NOTE — ED Notes (Signed)
Zammit MD wishes for pt to receive Geodon IM in order to obtain lab work.  Pt is still being uncooperative with phlebotomy and nursing staff.

## 2015-10-10 NOTE — ED Notes (Signed)
Pt witnessed having seizure by RN Juli, Dr. zammit made aware, advised to give patient 1mg  ativan IV

## 2015-10-10 NOTE — Progress Notes (Addendum)
ANTIBIOTIC CONSULT NOTE - INITIAL  Pharmacy Consult for Vancocin and Zosyn Indication: fever  Allergies  Allergen Reactions  . Morphine And Related Hives  . Penicillins Nausea Only    Has patient had a PCN reaction causing immediate rash, facial/tongue/throat swelling, SOB or lightheadedness with hypotension: No Has patient had a PCN reaction causing severe rash involving mucus membranes or skin necrosis: No Has patient had a PCN reaction that required hospitalization No Has patient had a PCN reaction occurring within the last 10 years: No If all of the above answers are "NO", then may proceed with Cephalosporin use.  [place] Extract Hives and Itching    Vital Signs: Temp: 101.3 F (38.5 C) (01/13 2135) Temp Source: Rectal (01/13 2135) BP: 148/82 mmHg (01/13 2135) Pulse Rate: 97 (01/13 2135)  Labs:  Recent Labs  10/10/15 1654 10/10/15 1704  WBC  --  11.5*  HGB 15.6* 13.5  PLT  --  298  CREATININE 0.70 0.76    Assessment/Plan:  60yo female presents w/ sz, now w/ fever, to begin IV ABX.  Will start Vancocin 500mg  IV Q12H and Zosyn 3.375g IV Q8H and monitor CBC and Cx.  Wynona Neat, PharmD, BCPS  10/10/2015,10:35 PM

## 2015-10-10 NOTE — ED Provider Notes (Signed)
CSN: [id number]     Arrival date & time 10/10/15  1128 History   First MD Initiated Contact with Patient 10/10/15 1157     No chief complaint on file.    (Consider location/radiation/quality/duration/timing/severity/associated sxs/prior Treatment) Patient is a 60 y.o. female presenting with seizures. The history is provided by the EMS personnel (Patient supposedly had multiple seizures today. She has a history of seizures alcohol alcohol abuse and poor compliance with her seizure medicine).  Seizures Seizure activity on arrival: no   Seizure type:  Grand mal Preceding symptoms comment:  Unknown Initial focality:  None Episode characteristics: combativeness   Postictal symptoms: confusion   Return to baseline: no   Severity:  Moderate   Past Medical History  Diagnosis Date  . Seizures ([place])   . Asthma   . H/O ETOH abuse    Past Surgical History  Procedure Laterality Date  . Abdominal hysterectomy     History reviewed. No pertinent family history. Social History  Substance Use Topics  . Smoking status: Never Smoker   . Smokeless tobacco: Never Used  . Alcohol Use: Yes     Comment: drinks heavily. Agrees with drinking every other day.    OB History    No data available     Review of Systems  Unable to perform ROS: Other  Neurological: Positive for seizures.      Allergies  Morphine and related; Penicillins; and Strawberry extract  Home Medications   Prior to Admission medications   Medication Sig Start Date End Date Taking? Authorizing Provider  acetaminophen-codeine (TYLENOL #3) 300-30 MG tablet Take 1 tablet by mouth every 8 (eight) hours as needed for moderate pain. 08/25/15   Gerlene Fee, NP  albuterol (PROVENTIL HFA;VENTOLIN HFA) 108 (90 BASE) MCG/ACT inhaler Inhale 2 puffs into the lungs every 6 (six) hours as needed for wheezing or shortness of breath. 01/19/15   Shanker Kristeen Mans, MD  Cholecalciferol (VITAMIN D PO) Take 1 tablet by mouth daily.     Historical Provider, MD  divalproex (DEPAKOTE) 500 MG DR tablet Take 1 tablet (500 mg total) by mouth every 12 (twelve) hours. 08/19/15   Reyne Dumas, MD  feeding supplement, ENSURE COMPLETE, (ENSURE COMPLETE) LIQD Take 237 mLs by mouth 2 (two) times daily between meals. 01/19/15   Shanker Kristeen Mans, MD  folic acid (FOLVITE) 1 MG tablet Take 1 tablet (1 mg total) by mouth daily. 01/19/15   Shanker Kristeen Mans, MD  levETIRAcetam (KEPPRA) 750 MG tablet Take 1 tablet (750 mg total) by mouth 2 (two) times daily. 08/19/15   Reyne Dumas, MD  LORazepam (ATIVAN) 2 MG tablet Take 1 tablet (2 mg total) by mouth every 6 (six) hours as needed for anxiety. 08/25/15   Gerlene Fee, NP  magnesium oxide (MAG-OX) 400 (241.3 MG) MG tablet Take 1 tablet (400 mg total) by mouth 2 (two) times daily. 02/07/15   Belkys A Regalado, MD  Multiple Vitamin (MULTIVITAMIN WITH MINERALS) TABS tablet Take 1 tablet by mouth daily.    Historical Provider, MD  pantoprazole (PROTONIX) 40 MG tablet Take 1 tablet (40 mg total) by mouth 2 (two) times daily. 08/19/15   Reyne Dumas, MD  thiamine 100 MG tablet Take 1 tablet (100 mg total) by mouth daily. Patient not taking: Reported on 08/15/2015 03/08/15   Reyne Dumas, MD   BP 160/82 mmHg  Pulse 90  Temp(Src)   Resp 20  SpO2 100% Physical Exam  Constitutional: She appears well-developed.  HENT:  Head: Normocephalic.  Eyes: Conjunctivae and EOM are normal. No scleral icterus.  Neck: Neck supple. No thyromegaly present.  Cardiovascular: Normal rate and regular rhythm.  Exam reveals no gallop and no friction rub.   No murmur heard. Pulmonary/Chest: No stridor. She has no wheezes. She has no rales. She exhibits no tenderness.  Abdominal: She exhibits no distension. There is no tenderness. There is no rebound.  Musculoskeletal: Normal range of motion. She exhibits no edema.  Lymphadenopathy:    She has no cervical adenopathy.  Neurological: She is alert. She exhibits normal muscle  tone. Coordination normal.  Patient continues will not answer questions appropriately she has combative  Skin: No rash noted. No erythema.    ED Course  Procedures (including critical care time) Labs Review Labs Reviewed  ETHANOL  URINALYSIS, ROUTINE W REFLEX MICROSCOPIC (NOT AT [facility])  URINE RAPID DRUG SCREEN, HOSP PERFORMED  CBC WITH DIFFERENTIAL/PLATELET  COMPREHENSIVE METABOLIC PANEL  VALPROIC ACID LEVEL  I-STAT CHEM 8, ED    Imaging Review Ct Head Wo Contrast  10/10/2015  CLINICAL DATA:  Multiple seizures today. Patient is combative leading to motion on the images. EXAM: CT HEAD WITHOUT CONTRAST TECHNIQUE: Contiguous axial images were obtained from the base of the skull through the vertex without intravenous contrast. COMPARISON:  08/15/2015 FINDINGS: Ventricles are normal in size and configuration. There is mild sulcal enlargement stable from the prior CT most evident involving the cerebellum. There are no parenchymal masses or mass effect, no evidence of a cortical infarct, no extra-axial masses or abnormal fluid collections and no intracranial hemorrhage. Visualized sinuses and mastoid air cells are clear. No change from the prior head CT. IMPRESSION: 1. No acute intracranial abnormalities. No change from the prior head CT. Electronically Signed   By: Lajean Manes M.D.   On: 10/10/2015 15:10   I have personally reviewed and evaluated these images and lab results as part of my medical decision-making.   EKG Interpretation None     Patient had one seizure in the emergency department and she was given Keppra and Ativan. She has been confused the whole time and combative will not allow anyone to draw blood on her. Patient was given 10mg  of Geodon and she was still combative and confused. She will be given some Ativan and we'll attempt to draw blood MDM   Final diagnoses:  None        Milton Ferguson, MD 10/10/15 1601

## 2015-10-10 NOTE — ED Notes (Signed)
Pt was witnessed having grand mal seizure, small amount of blood noted from mouth, mouth suctioned and non-rebreather placed.

## 2015-10-10 NOTE — Progress Notes (Addendum)
CRITICAL VALUE ALERT  Critical value received:  MRSA +  Date of notification:  10/10/15   Time of notification:  2230   Critical value read back:Yes.    Nurse who received alert:  Reynolds Bowl, RN  MD notified (1st page):  Dr Olevia Bowens in to assess  Time of first page:  2235  MD notified (2nd page):  Time of second page:  Responding MD:  Dr. Olevia Bowens  Time MD responded:  2235

## 2015-10-10 NOTE — Progress Notes (Signed)
Received report from ED nurse. Awaiting patients arrival.

## 2015-10-10 NOTE — ED Provider Notes (Signed)
Pt has stopped seizing but she has remained minimally repsonsive after the geodon.  Earlier she was cursing and yelling.  Neurology has been consulted .  Plan on admission for further evalaution  Dorie Rank, MD 10/10/15 [id number]

## 2015-10-10 NOTE — ED Notes (Signed)
Patient denies pain and is resting comfortably.  

## 2015-10-10 NOTE — ED Notes (Signed)
Pt presents with report of multiple seizures per family today.  Pt is postictal and combative.

## 2015-10-11 ENCOUNTER — Inpatient Hospital Stay (HOSPITAL_COMMUNITY): Payer: Medicaid Other

## 2015-10-11 DIAGNOSIS — R569 Unspecified convulsions: Secondary | ICD-10-CM

## 2015-10-11 LAB — CBC WITH DIFFERENTIAL/PLATELET
BASOS ABS: 0 10*3/uL (ref 0.0–0.1)
BASOS PCT: 0 %
EOS ABS: 0 10*3/uL (ref 0.0–0.7)
EOS PCT: 0 %
HCT: 39.1 % (ref 36.0–46.0)
Hemoglobin: 13.2 g/dL (ref 12.0–15.0)
Lymphocytes Relative: 27 %
Lymphs Abs: 1.8 10*3/uL (ref 0.7–4.0)
MCH: 30.7 pg (ref 26.0–34.0)
MCHC: 33.8 g/dL (ref 30.0–36.0)
MCV: 90.9 fL (ref 78.0–100.0)
MONO ABS: 0.4 10*3/uL (ref 0.1–1.0)
MONOS PCT: 6 %
Neutro Abs: 4.4 10*3/uL (ref 1.7–7.7)
Neutrophils Relative %: 67 %
PLATELETS: 251 10*3/uL (ref 150–400)
RBC: 4.3 MIL/uL (ref 3.87–5.11)
RDW: 13.9 % (ref 11.5–15.5)
WBC: 6.7 10*3/uL (ref 4.0–10.5)

## 2015-10-11 LAB — COMPREHENSIVE METABOLIC PANEL
ALBUMIN: 3.4 g/dL — AB (ref 3.5–5.0)
ALK PHOS: 77 U/L (ref 38–126)
ALT: 11 U/L — AB (ref 14–54)
ANION GAP: 11 (ref 5–15)
AST: 25 U/L (ref 15–41)
BILIRUBIN TOTAL: 1.6 mg/dL — AB (ref 0.3–1.2)
BUN: 10 mg/dL (ref 6–20)
CALCIUM: 9.1 mg/dL (ref 8.9–10.3)
CO2: 24 mmol/L (ref 22–32)
CREATININE: 0.83 mg/dL (ref 0.44–1.00)
Chloride: 107 mmol/L (ref 101–111)
GFR calc Af Amer: >60 mL/min (ref >60)
GFR calc non Af Amer: >60 mL/min (ref >60)
GLUCOSE: 60 mg/dL — AB (ref 65–99)
Potassium: 3.9 mmol/L (ref 3.5–5.1)
SODIUM: 142 mmol/L (ref 135–145)
TOTAL PROTEIN: 6.5 g/dL (ref 6.5–8.1)

## 2015-10-11 LAB — VALPROIC ACID LEVEL: VALPROIC ACID LVL: 39 ug/mL — AB (ref 50.0–100.0)

## 2015-10-11 LAB — GLUCOSE, CAPILLARY
GLUCOSE-CAPILLARY: 53 mg/dL — AB (ref 65–99)
GLUCOSE-CAPILLARY: 93 mg/dL (ref 65–99)
GLUCOSE-CAPILLARY: 48 mg/dL — AB (ref 65–99)
GLUCOSE-CAPILLARY: 70 mg/dL (ref 65–99)
GLUCOSE-CAPILLARY: 144 mg/dL — AB (ref 65–99)
GLUCOSE-CAPILLARY: 56 mg/dL — AB (ref 65–99)
Glucose-Capillary: 51 mg/dL — ABNORMAL LOW (ref 65–99)
Glucose-Capillary: 61 mg/dL — ABNORMAL LOW (ref 65–99)

## 2015-10-11 LAB — C DIFFICILE QUICK SCREEN W PCR REFLEX
C DIFFICILE (CDIFF) TOXIN: NEGATIVE
C Diff antigen: POSITIVE — AB

## 2015-10-11 LAB — CORTISOL: CORTISOL PLASMA: 13 ug/dL

## 2015-10-11 LAB — TSH: TSH: 0.588 u[IU]/mL (ref 0.350–4.500)

## 2015-10-11 MED ORDER — DEXTROSE 50 % IV SOLN
INTRAVENOUS | Status: AC
  Administered 2015-10-11: 25 mL
  Filled 2015-10-11: qty 50

## 2015-10-11 MED ORDER — PROMETHAZINE HCL 25 MG/ML IJ SOLN: 25.0000 mg | Freq: Four times a day (QID) | INTRAMUSCULAR | Status: DC | PRN

## 2015-10-11 MED ORDER — DEXTROSE 5 % IV SOLN: INTRAVENOUS | Status: DC

## 2015-10-11 MED ORDER — DEXTROSE 50 % IV SOLN
INTRAVENOUS | Status: AC
  Filled 2015-10-11: qty 50

## 2015-10-11 MED ORDER — ACYCLOVIR 5 % EX OINT
TOPICAL_OINTMENT | Freq: Four times a day (QID) | CUTANEOUS | Status: DC
  Administered 2015-10-11 – 2015-10-13 (×9): via TOPICAL
  Filled 2015-10-11 (×2): qty 15

## 2015-10-11 MED ORDER — DEXTROSE 5 % IV SOLN
INTRAVENOUS | Status: DC
  Administered 2015-10-11: 11:00:00 via INTRAVENOUS

## 2015-10-11 MED ORDER — SODIUM CHLORIDE 0.9 % IV SOLN: INTRAVENOUS | Status: DC

## 2015-10-11 MED ORDER — DEXTROSE 50 % IV SOLN
INTRAVENOUS | Status: AC
  Administered 2015-10-11: 50 mL
  Filled 2015-10-11: qty 50

## 2015-10-11 MED ORDER — VALACYCLOVIR HCL 500 MG PO TABS
1000.0000 mg | ORAL_TABLET | Freq: Three times a day (TID) | ORAL | Status: DC
  Administered 2015-10-11 – 2015-10-13 (×7): 1000 mg via ORAL
  Filled 2015-10-11 (×8): qty 2

## 2015-10-11 NOTE — Procedures (Signed)
History: 60 year old female with a history of seizures who presented with multiple seizures.  Sedation: None  Technique: This is a 21 channel routine scalp EEG performed at the bedside with bipolar and monopolar montages arranged in accordance to the international 10/20 system of electrode placement. One channel was dedicated to EKG recording.    Background: The background consists predominantly of intermixed alpha and beta activities. There is a well defined posterior dominant rhythm of 9 Hz that attenuates with eye opening. Even during maximal wakefulness, there is mild irregular generalized delta activity intermixed in with the background rhythms at time.  Photic stimulation: Physiologic driving is now performed  EEG Abnormalities: 1) mild slow activity  Clinical Interpretation: This EEG is consistent with a mild generalized nonspecific cerebral dysfunction (encephalopathy). This can be seen in postictal state among other etiologies. There was no seizure or seizure predisposition recorded on this study. Please note that a normal EEG does not preclude the possibility of epilepsy.   Roland Rack, MD [facility] [phone]  If 7pm- 7am, please page neurology on call as listed in [place].

## 2015-10-11 NOTE — Progress Notes (Signed)
BG is 144. Pt remained calm and cooperative. Alert and oriented x3. Safety measures remained in place. Call bell within reach. Will continue to monitor.

## 2015-10-11 NOTE — Progress Notes (Signed)
BG: 48. Dr. Candiss Norse notified. Order placed for Dextrose 5 infusion at 1124; 50 ml of D50 given at 1147. Pt remained A/O x2-3. Asymptomatic. Will continue to monitor and intervene PRN

## 2015-10-11 NOTE — Progress Notes (Addendum)
Dear Doctor:  This patient has been identified as a candidate for PICC for the following reason (s): drug pH or osmolality (causing phlebitis, infiltration in 24 hours), poor veins/poor circulatory system (CHF, COPD, emphysema, diabetes, steroid use, IV drug abuse, etc.) and restarts due to phlebitis and infiltration in 24 hours If you agree, please write an order for the indicated device. Please consider placing an order for Interventional Radiology to place this device.  Thank you for supporting the early vascular access assessment program.

## 2015-10-11 NOTE — Progress Notes (Deleted)
Follow up BG 70. Asymptomatic. Will continue to monitor.

## 2015-10-11 NOTE — Progress Notes (Signed)
Follow up BG 70. Given cranberry juice with sugar. Pt asymptomatic. Will continue to monitor.

## 2015-10-11 NOTE — Progress Notes (Signed)
Hypoglycemic Event  CBG: 53  Treatment: orange juice and 1/2 amp D 50 given.   Symptoms: Patient is cold. Stable vitals.   Follow-up CBG: Time: [id number] CBG Result: 93  Possible Reasons for Event:  Change in activity, medication regime, change in meals.  Comments/MD notified: Donnal Debar, NP    Kimyetta Flott Sarina Ill

## 2015-10-11 NOTE — Progress Notes (Addendum)
Patient Demographics:    Lindsey [name], is a 60 y.o. female, DOB - 09-Oct-1955, [id number]  Admit date - 10/10/2015   Admitting Physician Reubin Milan, MD  Outpatient Primary MD for the patient is No primary care provider on file.  LOS - 1   No chief complaint on file.       Subjective:    Lindsey [name] today has, No headache, No chest pain, No abdominal pain - No Nausea, No new weakness tingling or numbness, No Cough - SOB. Is a poor historian at this time due to mild delirium and encephalopathy. But in no distress.   Assessment  & Plan :     1. Breakthrough seizures. Question alcohol withdrawal -neurology on board currently on Depakote and Keppra, she has no headache and neck is supple. Head CT nonacute. Continue supportive care. EEG pending.  2. Encephalopathy. Likely due to #1 above, likely postictal along with withdrawing, alcohol. Supportive care. Clear liquid diet with feeding assistance and aspiration precautions for now.  3. Pneumonia likely aspiration. Place on clear liquid diet with feeding assistance and aspiration precautions, this likely was during seizure, on Vancocin Zosyn. Will taper down rapidly based on clinical improvement. Follow cultures.  4. Alcohol abuse. Counseled to quit. On CIWA protocol.  5. Hypoglycemia. Due to poor oral intake, alcohol abuse and likely poor liver function due to alcohol abuse, gentle D5 drip monitor CBGs. Check TSH, Cortisol, C peptide and A1c.  6. History of asthma. Stable no wheezing, supportive care only.  7. Herpetic ulcer on her upper lip. He is on Valtrex thousand 3 times a day for 7 days. Along with topical cream.    Code Status : Full  Family Communication  : None present  Disposition Plan  : Stay in stepdown for  now  Consults  :  Neurology  Procedures  : CT head nonacute  DVT Prophylaxis  :  Lovenox    Lab Results  Component Value Date   PLT 251 10/11/2015    Inpatient Medications  Scheduled Meds: . Chlorhexidine Gluconate Cloth  6 each Topical Q0600  . divalproex  500 mg Oral Q12H  . enoxaparin (LOVENOX) injection  40 mg Subcutaneous Q24H  . levETIRAcetam  750 mg Oral BID  . magnesium sulfate 1 - 4 g bolus IVPB  2 g Intravenous Once  . mupirocin ointment  1 application Nasal BID  . pantoprazole  40 mg Oral BID  . piperacillin-tazobactam (ZOSYN)  IV  3.375 g Intravenous Q8H  . thiamine  100 mg Oral Daily  . vancomycin  500 mg Intravenous Q12H   Continuous Infusions: . dextrose     PRN Meds:.LORazepam, promethazine  Antibiotics  :    Anti-infectives    Start     Dose/Rate Route Frequency Ordered Stop   10/11/15 0400  piperacillin-tazobactam (ZOSYN) IVPB 3.375 g     3.375 g 12.5 mL/hr over 240 Minutes Intravenous Every 8 hours 10/10/15 2237     10/10/15 2300  vancomycin (VANCOCIN) 500 mg in sodium chloride 0.9 % 100 mL IVPB     500 mg 100 mL/hr over 60 Minutes Intravenous Every 12 hours 10/10/15 2254     10/10/15 2245  piperacillin-tazobactam (ZOSYN) IVPB 3.375 g  3.375 g 100 mL/hr over 30 Minutes Intravenous  Once 10/10/15 2237 10/10/15 2337        Objective:   Filed Vitals:   10/11/15 0015 10/11/15 0356 10/11/15 0400 10/11/15 0830  BP: 132/75 156/80 137/66 137/79  Pulse: 89   93  Temp:  99.5 F (37.5 C)  99.8 F (37.7 C)  TempSrc:  Rectal  Axillary  Resp: 21 22 33 25  Height:      Weight:      SpO2: 95% 100%  100%    Wt Readings from Last 3 Encounters:  10/10/15 58.1 kg (128 lb 1.4 oz)  08/25/15 58.06 kg (128 lb)  08/18/15 58.514 kg (129 lb)     Intake/Output Summary (Last 24 hours) at 10/11/15 0929 Last data filed at 10/11/15 0400  Gross per 24 hour  Intake 1007.55 ml  Output      0 ml  Net 1007.55 ml     Physical Exam  Mildly somnolent ,  Oriented X 1, No new F.N deficits, Normal affect Lindsey [name],PERRAL Supple Neck,No JVD, No cervical lymphadenopathy appriciated.  Symmetrical Chest wall movement, Good air movement bilaterally, CTAB RRR,No Gallops,Rubs or new Murmurs, No Parasternal Heave +ve B.Sounds, Abd Soft, No tenderness, No organomegaly appriciated, No rebound - guarding or rigidity. No Cyanosis, Clubbing or edema, No new Rash or bruise      Data Review:   Micro Results Recent Results (from the past 240 hour(s))  MRSA PCR Screening     Status: Abnormal   Collection Time: 10/10/15  8:25 PM  Result Value Ref Range Status   MRSA by PCR POSITIVE (A) NEGATIVE Final    Comment:        The GeneXpert MRSA Assay (FDA approved for NASAL specimens only), is one component of a comprehensive MRSA colonization surveillance program. It is not intended to diagnose MRSA infection nor to guide or monitor treatment for MRSA infections. RESULT CALLED TO, READ BACK BY AND VERIFIED WITHDolan Amen RN [id number] 10/10/15 A BROWNING     Radiology Reports Ct Head Wo Contrast  10/10/2015  CLINICAL DATA:  Multiple seizures today. Patient is combative leading to motion on the images. EXAM: CT HEAD WITHOUT CONTRAST TECHNIQUE: Contiguous axial images were obtained from the base of the skull through the vertex without intravenous contrast. COMPARISON:  08/15/2015 FINDINGS: Ventricles are normal in size and configuration. There is mild sulcal enlargement stable from the prior CT most evident involving the cerebellum. There are no parenchymal masses or mass effect, no evidence of a cortical infarct, no extra-axial masses or abnormal fluid collections and no intracranial hemorrhage. Visualized sinuses and mastoid air cells are clear. No change from the prior head CT. IMPRESSION: 1. No acute intracranial abnormalities. No change from the prior head CT. Electronically Signed   By: Lajean Manes M.D.   On: 10/10/2015 15:10   Dg Chest Port 1  View  10/11/2015  CLINICAL DATA:  Acute onset of fever.  Initial encounter. EXAM: PORTABLE CHEST 1 VIEW COMPARISON:  Chest radiograph performed earlier today at 5:29 p.m. FINDINGS: The lungs are well-aerated. Mild right apical opacity raises concern for pneumonia. Right perihilar prominence is relatively stable and likely reflects prominent vasculature. There is no evidence of pleural effusion or pneumothorax. The cardiomediastinal silhouette is borderline normal in size. No acute osseous abnormalities are seen. IMPRESSION: Mild right apical airspace opacity raises concern for pneumonia. Right perihilar prominence is relatively stable and likely reflects prominent vasculature, though followup PA and lateral chest X-ray  is recommended in 3-4 weeks following trial of antibiotic therapy to ensure resolution and exclude underlying malignancy. Electronically Signed   By: Garald Balding M.D.   On: 10/11/2015 04:00   Dg Chest Portable 1 View  10/10/2015  CLINICAL DATA:  Multiple seizures today.  Alcohol abuse.  Fever. EXAM: PORTABLE CHEST 1 VIEW COMPARISON:  06/16/2015 FINDINGS: The heart size and mediastinal contours are within normal limits. Both lungs are clear. The visualized skeletal structures are unremarkable. IMPRESSION: No active disease. Electronically Signed   By: Earle Gell M.D.   On: 10/10/2015 18:08     CBC  Recent Labs Lab 10/10/15 1654 10/10/15 1704 10/11/15 0300  WBC  --  11.5* 6.7  HGB 15.6* 13.5 13.2  HCT 46.0 39.8 39.1  PLT  --  298 251  MCV  --  88.6 90.9  MCH  --  30.1 30.7  MCHC  --  33.9 33.8  RDW  --  13.8 13.9  LYMPHSABS  --  1.1 1.8  MONOABS  --  0.6 0.4  EOSABS  --  0.0 0.0  BASOSABS  --  0.0 0.0    Chemistries   Recent Labs Lab 10/10/15 1654 10/10/15 1704 10/11/15 0300  NA 141 142 142  K 4.0 4.0 3.9  CL 104 105 107  CO2  --  26 24  GLUCOSE 80 85 60*  BUN 13 9 10   CREATININE 0.70 0.76 0.83  CALCIUM  --  9.4 9.1  AST  --  24 25  ALT  --  13* 11*   ALKPHOS  --  92 77  BILITOT  --  0.8 1.6*   ------------------------------------------------------------------------------------------------------------------ No results for input(s): CHOL, HDL, LDLCALC, TRIG, CHOLHDL, LDLDIRECT in the last 72 hours.  No results found for: HGBA1C ------------------------------------------------------------------------------------------------------------------ No results for input(s): TSH, T4TOTAL, T3FREE, THYROIDAB in the last 72 hours.  Invalid input(s): FREET3 ------------------------------------------------------------------------------------------------------------------ No results for input(s): VITAMINB12, FOLATE, FERRITIN, TIBC, IRON, RETICCTPCT in the last 72 hours.  Coagulation profile No results for input(s): INR, PROTIME in the last 168 hours.  No results for input(s): DDIMER in the last 72 hours.  Cardiac Enzymes No results for input(s): CKMB, TROPONINI, MYOGLOBIN in the last 168 hours.  Invalid input(s): CK ------------------------------------------------------------------------------------------------------------------ No results found for: BNP  Time Spent in minutes  35   Lala Lund K M.D on 10/11/2015 at 9:29 AM  Between 7am to 7pm - Pager - [phone]  After 7pm go to [email or web address] - password [facility]  [facility] -  Office  [phone]

## 2015-10-11 NOTE — Progress Notes (Signed)
BG: 61. Dr. Candiss Norse notified. Increased D5W infusion to 164ml/hr. OJ with sugar given. Pt asymptomatic. Will continue to monitor and intervene PRN

## 2015-10-11 NOTE — Progress Notes (Signed)
Unable to complete admission assessment documentation. Family is not present and the patient states "I don't know" to all questions.

## 2015-10-11 NOTE — Progress Notes (Signed)
Subjective: Much improved compared to last night  Exam: Filed Vitals:   10/11/15 0830 10/11/15 1311  BP: 137/79   Pulse: 93   Temp: 99.8 F (37.7 C) 101 F (38.3 C)  Resp: 25    Gen: In bed, NAD Resp: non-labored breathing, no acute distress Abd: soft, nt  Neuro: MS: Awake, alert. Gives month as December and years 2010 she is otherwise interactive and appropriate. CN: EOMI, VFF Motor: Moves all extremity as well Sensory: Intact to light touch  Pertinent Labs: CMP-unremarkable  Impression: 60 year old female with breakthrough seizures in the setting of likely medication noncompliance given undetectable VPA level. With her improvement, my concern for CNS infection is relatively low.  Recommendations: 1) continue VPA, levetiracetam 2) neurology will continue to follow  Roland Rack, MD [facility] [phone]  If 7pm- 7am, please page neurology on call as listed in [place].

## 2015-10-11 NOTE — Progress Notes (Signed)
EEG completed, results pending. 

## 2015-10-11 NOTE — Progress Notes (Signed)
BG: 51. Pt asymptomatic. OJ with sugar given. Will continue to monitor and recheck BG

## 2015-10-12 LAB — CBC
HCT: 37 % (ref 36.0–46.0)
HEMOGLOBIN: 12.7 g/dL (ref 12.0–15.0)
MCH: 30.5 pg (ref 26.0–34.0)
MCHC: 34.3 g/dL (ref 30.0–36.0)
MCV: 88.7 fL (ref 78.0–100.0)
Platelets: 243 10*3/uL (ref 150–400)
RBC: 4.17 MIL/uL (ref 3.87–5.11)
RDW: 13.7 % (ref 11.5–15.5)
WBC: 8.3 10*3/uL (ref 4.0–10.5)

## 2015-10-12 LAB — GLUCOSE, CAPILLARY
GLUCOSE-CAPILLARY: 110 mg/dL — AB (ref 65–99)
GLUCOSE-CAPILLARY: 55 mg/dL — AB (ref 65–99)
GLUCOSE-CAPILLARY: 77 mg/dL (ref 65–99)
Glucose-Capillary: 66 mg/dL (ref 65–99)
Glucose-Capillary: 67 mg/dL (ref 65–99)
Glucose-Capillary: 75 mg/dL (ref 65–99)

## 2015-10-12 LAB — BASIC METABOLIC PANEL
Anion gap: 12 (ref 5–15)
BUN: 6 mg/dL (ref 6–20)
CHLORIDE: 103 mmol/L (ref 101–111)
CO2: 23 mmol/L (ref 22–32)
CREATININE: 0.63 mg/dL (ref 0.44–1.00)
Calcium: 9.1 mg/dL (ref 8.9–10.3)
GFR calc Af Amer: >60 mL/min (ref >60)
GFR calc non Af Amer: >60 mL/min (ref >60)
GLUCOSE: 106 mg/dL — AB (ref 65–99)
POTASSIUM: 3.4 mmol/L — AB (ref 3.5–5.1)
Sodium: 138 mmol/L (ref 135–145)

## 2015-10-12 LAB — MAGNESIUM: Magnesium: 1.7 mg/dL (ref 1.7–2.4)

## 2015-10-12 LAB — C-PEPTIDE: C-Peptide: 2.9 ng/mL (ref 1.1–4.4)

## 2015-10-12 MED ORDER — CHLORDIAZEPOXIDE HCL 5 MG PO CAPS
5.0000 mg | ORAL_CAPSULE | Freq: Three times a day (TID) | ORAL | Status: DC
  Administered 2015-10-12 – 2015-10-13 (×4): 5 mg via ORAL
  Filled 2015-10-12 (×5): qty 1

## 2015-10-12 MED ORDER — DEXTROSE 5 % IV SOLN
1.0000 g | INTRAVENOUS | Status: DC
  Administered 2015-10-12 – 2015-10-13 (×2): 1 g via INTRAVENOUS
  Filled 2015-10-12 (×2): qty 10

## 2015-10-12 MED ORDER — VANCOMYCIN 50 MG/ML ORAL SOLUTION
250.0000 mg | Freq: Four times a day (QID) | ORAL | Status: DC
  Administered 2015-10-12 – 2015-10-13 (×5): 250 mg via ORAL
  Filled 2015-10-12 (×9): qty 5

## 2015-10-12 MED ORDER — POTASSIUM CHLORIDE CRYS ER 20 MEQ PO TBCR
40.0000 meq | EXTENDED_RELEASE_TABLET | Freq: Once | ORAL | Status: AC
  Administered 2015-10-12: 40 meq via ORAL
  Filled 2015-10-12: qty 2

## 2015-10-12 MED ORDER — METRONIDAZOLE IN NACL 5-0.79 MG/ML-% IV SOLN
500.0000 mg | Freq: Three times a day (TID) | INTRAVENOUS | Status: DC
  Administered 2015-10-12 – 2015-10-13 (×4): 500 mg via INTRAVENOUS
  Filled 2015-10-12 (×6): qty 100

## 2015-10-12 MED ORDER — DEXTROSE 50 % IV SOLN
INTRAVENOUS | Status: AC
  Administered 2015-10-12: 50 mL
  Filled 2015-10-12: qty 50

## 2015-10-12 MED ORDER — METOPROLOL TARTRATE 50 MG PO TABS
75.0000 mg | ORAL_TABLET | Freq: Two times a day (BID) | ORAL | Status: DC
  Administered 2015-10-12 (×2): 75 mg via ORAL
  Filled 2015-10-12 (×2): qty 1

## 2015-10-12 MED ORDER — ACETAMINOPHEN 325 MG PO TABS
650.0000 mg | ORAL_TABLET | Freq: Four times a day (QID) | ORAL | Status: DC | PRN
Start: 2015-10-12 — End: 2015-10-13
  Administered 2015-10-12 – 2015-10-13 (×2): 650 mg via ORAL
  Filled 2015-10-12 (×2): qty 2

## 2015-10-12 NOTE — Progress Notes (Signed)
Subjective: Continues to improve.   States that she had been out of seizure medicine for five days prior to admission.   Exam: Filed Vitals:   10/12/15 0516 10/12/15 0800  BP: 148/110 139/77  Pulse:  48  Temp: 98 F (36.7 C) 97.6 F (36.4 C)  Resp: 19 18   Gen: In bed, NAD Resp: non-labored breathing, no acute distress Abd: soft, nt  Neuro: MS: Awake, alert. Oriented to month and year. CN: EOMI, VFF Motor: Moves all extremity as well Sensory: Intact to light touch   Impression: 60 year old female with breakthrough seizures in the setting of medication noncompliance. With her improvement, my concern for CNS infection is low and would not pursue further testing unless she were to worsen again.   She   Recommendations: 1) continue VPA, levetiracetam 2) may benefit from social work to assist with medications 3) Neurology to sign off, please call with any further quesitons or concerns.   Roland Rack, MD [facility] [phone]  If 7pm- 7am, please page neurology on call as listed in [place].

## 2015-10-12 NOTE — Progress Notes (Signed)
Patient Demographics:    Lindsey [name], is a 60 y.o. female, DOB - September 08, 1956, [id number]  Admit date - 10/10/2015   Admitting Physician Reubin Milan, MD  Outpatient Primary MD for the patient is No primary care provider on file.  LOS - 2   No chief complaint on file.       Subjective:    Lindsey [name] today has, No headache, No chest pain, No abdominal pain - No Nausea, No new weakness tingling or numbness, No Cough - SOB. Is a poor historian at this time due to mild delirium and encephalopathy. But in no distress.   Assessment  & Plan :     1. Breakthrough seizures. Question alcohol/intoxication withdrawal -neurology on board currently on Depakote and Keppra, she has no headache and neck is supple. Head CT nonacute. Continue supportive care. EEG non acute.  2. Encephalopathy. Likely due to #1 above, likely postictal along with withdrawing, alcohol. Supportive care. Much better.  3. Pneumonia likely aspiration. Mentation better place on soft diet with feeding assistance and aspiration precautions, this likely was during seizure, on Vanco since C diff +ve will taper Zosyn to Rocephin - Flagyl .  4. Alcohol abuse. Counseled to quit. On CIWA protocol.  5. Hypoglycemia. Due to poor oral intake, alcohol abuse and likely poor liver function due to alcohol abuse, gentle D5 drip monitor CBGs. Stable TSH & Cortisol, pending C peptide and A1c.  6. History of asthma. Stable no wheezing, supportive care only.  7. Herpetic ulcer on her upper lip. He is on Valtrex thousand 3 times a day for 7 days. Along with topical cream.  8. ? C. Diff (-ve Toxin) but ++ diarrhea - Rx with PO Vanco and monitor.    Code Status : Full  Family Communication  : None present  Disposition Plan  : Stay in  stepdown for now  Consults  :  Neurology  Procedures  : CT head and EEG nonacute  DVT Prophylaxis  :  Lovenox    Lab Results  Component Value Date   PLT 243 10/12/2015    Inpatient Medications  Scheduled Meds: . acyclovir ointment   Topical Q6H  . cefTRIAXone (ROCEPHIN)  IV  1 g Intravenous Q24H  . chlordiazePOXIDE  5 mg Oral TID  . Chlorhexidine Gluconate Cloth  6 each Topical Q0600  . divalproex  500 mg Oral Q12H  . enoxaparin (LOVENOX) injection  40 mg Subcutaneous Q24H  . levETIRAcetam  750 mg Oral BID  . magnesium sulfate 1 - 4 g bolus IVPB  2 g Intravenous Once  . metoprolol tartrate  75 mg Oral BID  . metronidazole  500 mg Intravenous 3 times per day  . mupirocin ointment  1 application Nasal BID  . pantoprazole  40 mg Oral BID  . potassium chloride  40 mEq Oral Once  . thiamine  100 mg Oral Daily  . valACYclovir  1,000 mg Oral TID  . vancomycin  250 mg Oral 4 times per day  . vancomycin  500 mg Intravenous Q12H   Continuous Infusions:   PRN Meds:.LORazepam, promethazine  Antibiotics  :    Anti-infectives    Start     Dose/Rate Route Frequency Ordered Stop   10/12/15  0900  cefTRIAXone (ROCEPHIN) 1 g in dextrose 5 % 50 mL IVPB     1 g 100 mL/hr over 30 Minutes Intravenous Every 24 hours 10/12/15 0807     10/12/15 0815  metroNIDAZOLE (FLAGYL) IVPB 500 mg     500 mg 100 mL/hr over 60 Minutes Intravenous 3 times per day 10/12/15 0807     10/12/15 0815  vancomycin (VANCOCIN) 50 mg/mL oral solution 250 mg     250 mg Oral 4 times per day 10/12/15 0807     10/11/15 1100  valACYclovir (VALTREX) tablet 1,000 mg     1,000 mg Oral 3 times daily 10/11/15 0935 10/18/15 0959   10/11/15 0400  piperacillin-tazobactam (ZOSYN) IVPB 3.375 g  Status:  Discontinued     3.375 g 12.5 mL/hr over 240 Minutes Intravenous Every 8 hours 10/10/15 2237 10/12/15 0807   10/10/15 2300  vancomycin (VANCOCIN) 500 mg in sodium chloride 0.9 % 100 mL IVPB     500 mg 100 mL/hr over 60 Minutes  Intravenous Every 12 hours 10/10/15 2254     10/10/15 2245  piperacillin-tazobactam (ZOSYN) IVPB 3.375 g     3.375 g 100 mL/hr over 30 Minutes Intravenous  Once 10/10/15 2237 10/10/15 2337        Objective:   Filed Vitals:   10/11/15 2200 10/12/15 0011 10/12/15 0200 10/12/15 0516  BP: 139/74 129/84 160/79 148/110  Pulse:  88    Temp:  98.5 F (36.9 C)  98 F (36.7 C)  TempSrc:  Axillary  Axillary  Resp: 24 14 23 19   Height:      Weight:      SpO2:        Wt Readings from Last 3 Encounters:  10/10/15 58.1 kg (128 lb 1.4 oz)  08/25/15 58.06 kg (128 lb)  08/18/15 58.514 kg (129 lb)     Intake/Output Summary (Last 24 hours) at 10/12/15 0809 Last data filed at 10/12/15 0500  Gross per 24 hour  Intake   1990 ml  Output      0 ml  Net   1990 ml     Physical Exam  Awake & alert , Oriented X 3, No new F.N deficits, Normal affect [place].AT,PERRAL Supple Neck,No JVD, No cervical lymphadenopathy appriciated.  Symmetrical Chest wall movement, Good air movement bilaterally, CTAB RRR,No Gallops,Rubs or new Murmurs, No Parasternal Heave +ve B.Sounds, Abd Soft, No tenderness, No organomegaly appriciated, No rebound - guarding or rigidity. No Cyanosis, Clubbing or edema, No new Rash or bruise      Data Review:   Micro Results Recent Results (from the past 240 hour(s))  MRSA PCR Screening     Status: Abnormal   Collection Time: 10/10/15  8:25 PM  Result Value Ref Range Status   MRSA by PCR POSITIVE (A) NEGATIVE Final    Comment:        The GeneXpert MRSA Assay (FDA approved for NASAL specimens only), is one component of a comprehensive MRSA colonization surveillance program. It is not intended to diagnose MRSA infection nor to guide or monitor treatment for MRSA infections. RESULT CALLED TO, READ BACK BY AND VERIFIED WITH: Dolan Amen RN [id number] 10/10/15 A BROWNING   C difficile quick scan w PCR reflex     Status: Abnormal   Collection Time: 10/11/15 12:31 PM  Result  Value Ref Range Status   C Diff antigen POSITIVE (A) NEGATIVE Final   C Diff toxin NEGATIVE NEGATIVE Final   C Diff interpretation  Final    C. difficile present, but toxin not detected. This indicates colonization. In most cases, this does not require treatment. If patient has signs and symptoms consistent with colitis, consider treatment. Requires ENTERIC precautions.    Radiology Reports Ct Head Wo Contrast  10/10/2015  CLINICAL DATA:  Multiple seizures today. Patient is combative leading to motion on the images. EXAM: CT HEAD WITHOUT CONTRAST TECHNIQUE: Contiguous axial images were obtained from the base of the skull through the vertex without intravenous contrast. COMPARISON:  08/15/2015 FINDINGS: Ventricles are normal in size and configuration. There is mild sulcal enlargement stable from the prior CT most evident involving the cerebellum. There are no parenchymal masses or mass effect, no evidence of a cortical infarct, no extra-axial masses or abnormal fluid collections and no intracranial hemorrhage. Visualized sinuses and mastoid air cells are clear. No change from the prior head CT. IMPRESSION: 1. No acute intracranial abnormalities. No change from the prior head CT. Electronically Signed   By: Lajean Manes M.D.   On: 10/10/2015 15:10   Dg Chest Port 1 View  10/11/2015  CLINICAL DATA:  Acute onset of fever.  Initial encounter. EXAM: PORTABLE CHEST 1 VIEW COMPARISON:  Chest radiograph performed earlier today at 5:29 p.m. FINDINGS: The lungs are well-aerated. Mild right apical opacity raises concern for pneumonia. Right perihilar prominence is relatively stable and likely reflects prominent vasculature. There is no evidence of pleural effusion or pneumothorax. The cardiomediastinal silhouette is borderline normal in size. No acute osseous abnormalities are seen. IMPRESSION: Mild right apical airspace opacity raises concern for pneumonia. Right perihilar prominence is relatively stable and  likely reflects prominent vasculature, though followup PA and lateral chest X-ray is recommended in 3-4 weeks following trial of antibiotic therapy to ensure resolution and exclude underlying malignancy. Electronically Signed   By: Garald Balding M.D.   On: 10/11/2015 04:00   Dg Chest Portable 1 View  10/10/2015  CLINICAL DATA:  Multiple seizures today.  Alcohol abuse.  Fever. EXAM: PORTABLE CHEST 1 VIEW COMPARISON:  06/16/2015 FINDINGS: The heart size and mediastinal contours are within normal limits. Both lungs are clear. The visualized skeletal structures are unremarkable. IMPRESSION: No active disease. Electronically Signed   By: Earle Gell M.D.   On: 10/10/2015 18:08     CBC  Recent Labs Lab 10/10/15 1654 10/10/15 1704 10/11/15 0300 10/12/15 0355  WBC  --  11.5* 6.7 8.3  HGB 15.6* 13.5 13.2 12.7  HCT 46.0 39.8 39.1 37.0  PLT  --  298 251 243  MCV  --  88.6 90.9 88.7  MCH  --  30.1 30.7 30.5  MCHC  --  33.9 33.8 34.3  RDW  --  13.8 13.9 13.7  LYMPHSABS  --  1.1 1.8  --   MONOABS  --  0.6 0.4  --   EOSABS  --  0.0 0.0  --   BASOSABS  --  0.0 0.0  --     Chemistries   Recent Labs Lab 10/10/15 1654 10/10/15 1704 10/11/15 0300 10/12/15 0355  NA 141 142 142 138  K 4.0 4.0 3.9 3.4*  CL 104 105 107 103  CO2  --  26 24 23   GLUCOSE 80 85 60* 106*  BUN 13 9 10 6   CREATININE 0.70 0.76 0.83 0.63  CALCIUM  --  9.4 9.1 9.1  MG  --   --   --  1.7  AST  --  24 25  --   ALT  --  13* 11*  --   ALKPHOS  --  92 77  --   BILITOT  --  0.8 1.6*  --    ------------------------------------------------------------------------------------------------------------------ No results for input(s): CHOL, HDL, LDLCALC, TRIG, CHOLHDL, LDLDIRECT in the last 72 hours.  No results found for: HGBA1C ------------------------------------------------------------------------------------------------------------------  Recent Labs  10/11/15 1905  TSH 0.588    ------------------------------------------------------------------------------------------------------------------ No results for input(s): VITAMINB12, FOLATE, FERRITIN, TIBC, IRON, RETICCTPCT in the last 72 hours.  Coagulation profile No results for input(s): INR, PROTIME in the last 168 hours.  No results for input(s): DDIMER in the last 72 hours.  Cardiac Enzymes No results for input(s): CKMB, TROPONINI, MYOGLOBIN in the last 168 hours.  Invalid input(s): CK ------------------------------------------------------------------------------------------------------------------ No results found for: BNP  Time Spent in minutes  35   Divya Munshi K M.D on 10/12/2015 at 8:09 AM  Between 7am to 7pm - Pager - [phone]  After 7pm go to [email or web address] - password [facility]  [facility] -  Office  [phone]

## 2015-10-13 LAB — HEMOGLOBIN A1C
Hgb A1c MFr Bld: 5.3 % (ref 4.8–5.6)
MEAN PLASMA GLUCOSE: 105 mg/dL

## 2015-10-13 LAB — GLUCOSE, CAPILLARY
GLUCOSE-CAPILLARY: 47 mg/dL — AB (ref 65–99)
GLUCOSE-CAPILLARY: 113 mg/dL — AB (ref 65–99)
GLUCOSE-CAPILLARY: 100 mg/dL — AB (ref 65–99)

## 2015-10-13 MED ORDER — METOPROLOL TARTRATE 25 MG PO TABS: 75.0000 mg | ORAL_TABLET | Freq: Two times a day (BID) | ORAL | Status: DC

## 2015-10-13 MED ORDER — CEFPODOXIME PROXETIL 200 MG PO TABS: 200.0000 mg | ORAL_TABLET | Freq: Two times a day (BID) | ORAL | Status: DC

## 2015-10-13 MED ORDER — DIVALPROEX SODIUM 500 MG PO DR TAB: 500.0000 mg | DELAYED_RELEASE_TABLET | Freq: Two times a day (BID) | ORAL | Status: DC

## 2015-10-13 MED ORDER — VALACYCLOVIR HCL 1 G PO TABS: 1000.0000 mg | ORAL_TABLET | Freq: Three times a day (TID) | ORAL | Status: DC

## 2015-10-13 MED ORDER — VANCOMYCIN 50 MG/ML ORAL SOLUTION: 250.0000 mg | Freq: Four times a day (QID) | ORAL | Status: AC

## 2015-10-13 MED ORDER — METOPROLOL TARTRATE 25 MG PO TABS
25.0000 mg | ORAL_TABLET | Freq: Two times a day (BID) | ORAL | Status: DC
  Administered 2015-10-13: 25 mg via ORAL
  Filled 2015-10-13: qty 1

## 2015-10-13 MED ORDER — LEVETIRACETAM 750 MG PO TABS: 750.0000 mg | ORAL_TABLET | Freq: Two times a day (BID) | ORAL | Status: DC

## 2015-10-13 MED ORDER — THIAMINE HCL 100 MG PO TABS: 100.0000 mg | ORAL_TABLET | Freq: Every day | ORAL | Status: DC

## 2015-10-13 MED ORDER — METRONIDAZOLE 500 MG PO TABS: 500.0000 mg | ORAL_TABLET | Freq: Three times a day (TID) | ORAL | Status: DC

## 2015-10-13 MED ORDER — MUPIROCIN 2 % EX OINT: 1.0000 "application " | TOPICAL_OINTMENT | Freq: Two times a day (BID) | CUTANEOUS | Status: DC

## 2015-10-13 NOTE — Discharge Summary (Signed)
Lindsey [name], is a 60 y.o. female  DOB Jan 03, 1956  MRN [id number].  Admission date:  10/10/2015  Admitting Physician  Reubin Milan, MD  Discharge Date:  10/13/2015   Primary MD  No primary care provider on file.  Recommendations for primary care physician for things to follow:   Check CBC, BMP and a 2 view chest x-ray in a week. Close outpatient neurology follow-up.   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 Seizures ([place])   . Asthma   . H/O ETOH abuse     Past Surgical History  Procedure Laterality Date  . Abdominal hysterectomy         HPI  from the history and physical done on the day of admission:    Lindsey [name] is a 60 y.o. female with a past medical history of seizures, asthma, medication noncompliance, alcohol abuse, alcohol withdrawal seizures who was brought in by EMS due to having multiple seizures today. This history was provided by EMS personnel and no family members are available at this time. Phone number provided on face sheet has been disconnected.  In the ER, the patient was medicated with lorazepam, Keppra and Geodon due to seizures and combativeness. No further history is available at this time.     Hospital Course:     1. Breakthrough seizures. Question alcohol/intoxication withdrawal -neurology on board currently on Depakote and Keppra, she has no headache and neck is supple. Head CT nonacute. Continue supportive care. EEG non acute. He had by neurology for discharge. We'll continue home dose Depakote and Keppra  counseled on compliance. Counseled to stop alcohol use. She claims she had plenty of alcohol the day before her admission.  2. Encephalopathy. Likely due to #1 above was postictal, completely resolved back to baseline.  3. Pneumonia likely aspiration. Mentation better place on soft diet with feeding assistance and aspiration precautions, this likely was during seizure, on Vanco since C diff +ve will taper to oral Vantin and Flagyl for 5 days . Repeat 2 view chest x-ray in a week along with CBC BMP.  4. Alcohol abuse. Counseled to quit. On CIWA protocol.  5. Hypoglycemia. Due to poor oral intake, alcohol abuse and likely poor liver function due to alcohol abuse, she initially needed gentle  D5 drip. Stable C peptide,TSH & Cortisol. Stable CBGs on oral diet.  CBG (last 3)   Recent Labs  10/12/15 1656 10/12/15 2352 10/13/15 0400  GLUCAP 47* 110* 113*     6. History of asthma. Stable no wheezing, supportive care only.  7. Herpetic ulcer on her upper lip. She is on Valtrex thousand 3 times a day for 7 days. Along with topical cream. Ulcer much improved.  8. ? C.  Diff (-ve Toxin) but ++ diarrhea - bonded well to oral vancomycin, place on oral vancomycin for 1 more week.    Discharge Condition: Stable  Follow UP  Follow-up Information    Follow up with [facility]. Schedule an appointment as soon as possible for a visit in 1 week.   Contact information:   [address] [place] [phone]      Follow up with [facility]. Schedule an appointment as soon as possible for a visit in 1 week.   Contact information:   [address]     [place] [phone]       Consults obtained - Neuro  Diet and Activity recommendation: See Discharge Instructions below  Discharge Instructions       Discharge Instructions    Diet - low sodium heart healthy    Complete  by:  As directed      Diet - low sodium heart healthy    Complete by:  As directed      Discharge instructions    Complete by:  As directed   Do not drive, operating heavy machinery, perform activities at heights, swimming or participation in water activities or provide baby sitting services until you have seen by Primary MD or a Neurologist and advised to do so again.  Follow with Primary MD in 7 days   Get CBC, CMP, 2 view Chest X ray checked  by Primary MD next visit.    Activity: As tolerated with Full fall precautions use walker/cane & assistance as needed   Disposition Home     Diet:   Heart Healthy with feeding assistance and aspiration precautions.  For Heart failure patients - Check your Weight same time everyday, if you gain over 2 pounds, or you develop in leg swelling, experience more shortness of breath or chest pain, call your Primary MD immediately. Follow Cardiac Low Salt Diet and 1.5 lit/day fluid restriction.   On your next visit with your primary care physician please Get Medicines reviewed and adjusted.   Please request your Prim.MD to go over all Hospital Tests and Procedure/Radiological results at the follow up, please get all Hospital records sent to your Prim MD by signing hospital release before you go home.   If you experience worsening of your admission symptoms, develop shortness of breath, life threatening emergency, suicidal or homicidal thoughts you must seek medical attention immediately by calling 911 or calling your MD immediately  if symptoms less severe.  You Must read complete instructions/literature along with all the possible adverse reactions/side effects for all the Medicines you take and that have been prescribed to you. Take any new Medicines after you have completely understood and accpet all the possible adverse reactions/side effects.   Do not drive when taking Pain medications.    Do not take more than prescribed Pain, Sleep and  Anxiety Medications  Special Instructions: If you have smoked or chewed Tobacco  in the last 2 yrs please stop smoking, stop any regular Alcohol  and or any Recreational drug use.  Wear Seat belts while driving.   Please note  You were cared for by a hospitalist during your hospital stay. If you have any questions about your discharge medications or the care you received while you were in the hospital after you are discharged, you can call the unit and asked to speak with the hospitalist on call if the hospitalist that  took care of you is not available. Once you are discharged, your primary care physician will handle any further medical issues. Please note that NO REFILLS for any discharge medications will be authorized once you are discharged, as it is imperative that you return to your primary care physician (or establish a relationship with a primary care physician if you do not have one) for your aftercare needs so that they can reassess your need for medications and monitor your lab values.     Increase activity slowly    Complete by:  As directed      Increase activity slowly    Complete by:  As directed              Discharge Medications       Medication List    TAKE these medications        albuterol 108 (90 Base) MCG/ACT inhaler  Commonly known as:  PROVENTIL HFA;VENTOLIN HFA  Inhale 2 puffs into the lungs every 6 (six) hours as needed for wheezing or shortness of breath.     cefpodoxime 200 MG tablet  Commonly known as:  VANTIN  Take 1 tablet (200 mg total) by mouth 2 (two) times daily.     divalproex 500 MG DR tablet  Commonly known as:  DEPAKOTE  Take 1 tablet (500 mg total) by mouth every 12 (twelve) hours.     feeding supplement (ENSURE COMPLETE) Liqd  Take 237 mLs by mouth 2 (two) times daily between meals.     folic acid 1 MG tablet  Commonly known as:  FOLVITE  Take 1 tablet (1 mg total) by mouth daily.     levETIRAcetam 750 MG tablet  Commonly known  as:  KEPPRA  Take 1 tablet (750 mg total) by mouth 2 (two) times daily.     LORazepam 2 MG tablet  Commonly known as:  ATIVAN  Take 1 tablet (2 mg total) by mouth every 6 (six) hours as needed for anxiety.     metoprolol tartrate 25 MG tablet  Commonly known as:  LOPRESSOR  Take 3 tablets (75 mg total) by mouth 2 (two) times daily.     metroNIDAZOLE 500 MG tablet  Commonly known as:  FLAGYL  Take 1 tablet (500 mg total) by mouth 3 (three) times daily.     multivitamin with minerals Tabs tablet  Take 1 tablet by mouth daily.     mupirocin ointment 2 %  Commonly known as:  BACTROBAN  Place 1 application into the nose 2 (two) times daily.     pantoprazole 40 MG tablet  Commonly known as:  PROTONIX  Take 1 tablet (40 mg total) by mouth 2 (two) times daily.     thiamine 100 MG tablet  Take 1 tablet (100 mg total) by mouth daily.     valACYclovir 1000 MG tablet  Commonly known as:  VALTREX  Take 1 tablet (1,000 mg total) by mouth 3 (three) times daily.     vancomycin 50 mg/mL oral solution  Commonly known as:  VANCOCIN  Take 5 mLs (250 mg total) by mouth every 6 (six) hours.     VITAMIN D PO  Take 1 tablet by mouth daily. Reported on 10/12/2015        Major procedures and Radiology Reports - PLEASE review detailed and final reports for all details, in brief -    CT head and EEG nonacute   Ct Head Wo Contrast  10/10/2015  CLINICAL DATA:  Multiple seizures today. Patient is combative leading to motion on the images. EXAM: CT HEAD WITHOUT CONTRAST TECHNIQUE: Contiguous axial images were obtained from the base of the skull through the vertex without intravenous contrast. COMPARISON:  08/15/2015 FINDINGS: Ventricles are normal in size and configuration. There is mild sulcal enlargement stable from the prior CT most evident involving the cerebellum. There are no parenchymal masses or mass effect, no evidence of a cortical infarct, no extra-axial masses or abnormal fluid  collections and no intracranial hemorrhage. Visualized sinuses and mastoid air cells are clear. No change from the prior head CT. IMPRESSION: 1. No acute intracranial abnormalities. No change from the prior head CT. Electronically Signed   By: Lajean Manes M.D.   On: 10/10/2015 15:10   Dg Chest Port 1 View  10/11/2015  CLINICAL DATA:  Acute onset of fever.  Initial encounter. EXAM: PORTABLE CHEST 1 VIEW COMPARISON:  Chest radiograph performed earlier today at 5:29 p.m. FINDINGS: The lungs are well-aerated. Mild right apical opacity raises concern for pneumonia. Right perihilar prominence is relatively stable and likely reflects prominent vasculature. There is no evidence of pleural effusion or pneumothorax. The cardiomediastinal silhouette is borderline normal in size. No acute osseous abnormalities are seen. IMPRESSION: Mild right apical airspace opacity raises concern for pneumonia. Right perihilar prominence is relatively stable and likely reflects prominent vasculature, though followup PA and lateral chest X-ray is recommended in 3-4 weeks following trial of antibiotic therapy to ensure resolution and exclude underlying malignancy. Electronically Signed   By: Garald Balding M.D.   On: 10/11/2015 04:00   Dg Chest Portable 1 View  10/10/2015  CLINICAL DATA:  Multiple seizures today.  Alcohol abuse.  Fever. EXAM: PORTABLE CHEST 1 VIEW COMPARISON:  06/16/2015 FINDINGS: The heart size and mediastinal contours are within normal limits. Both lungs are clear. The visualized skeletal structures are unremarkable. IMPRESSION: No active disease. Electronically Signed   By: Earle Gell M.D.   On: 10/10/2015 18:08    Micro Results      Recent Results (from the past 240 hour(s))  MRSA PCR Screening     Status: Abnormal   Collection Time: 10/10/15  8:25 PM  Result Value Ref Range Status   MRSA by PCR POSITIVE (A) NEGATIVE Final    Comment:        The GeneXpert MRSA Assay (FDA approved for NASAL  specimens only), is one component of a comprehensive MRSA colonization surveillance program. It is not intended to diagnose MRSA infection nor to guide or monitor treatment for MRSA infections. RESULT CALLED TO, READ BACK BY AND VERIFIED WITH: Dolan Amen RN [id number] 10/10/15 A BROWNING   Culture, blood (Routine X 2) w Reflex to ID Panel     Status: None (Preliminary result)   Collection Time: 10/11/15 12:15 AM  Result Value Ref Range Status   Specimen Description BLOOD RIGHT HAND  Final   Special Requests IN PEDIATRIC BOTTLE 3ML  Final   Culture NO GROWTH 1 DAY  Final   Report Status PENDING  Incomplete  Culture, blood (Routine X 2) w Reflex to ID Panel     Status: None (Preliminary result)   Collection Time: 10/11/15 12:25 AM  Result Value Ref Range Status   Specimen Description BLOOD RIGHT ARM  Final   Special Requests IN PEDIATRIC BOTTLE 3ML  Final   Culture NO GROWTH 1 DAY  Final   Report Status PENDING  Incomplete  C difficile quick scan w PCR reflex  Status: Abnormal   Collection Time: 10/11/15 12:31 PM  Result Value Ref Range Status   C Diff antigen POSITIVE (A) NEGATIVE Final   C Diff toxin NEGATIVE NEGATIVE Final   C Diff interpretation   Final    C. difficile present, but toxin not detected. This indicates colonization. In most cases, this does not require treatment. If patient has signs and symptoms consistent with colitis, consider treatment. Requires ENTERIC precautions.    Today   Subjective    Lindsey [name] today has no headache,no chest abdominal pain,no new weakness tingling or numbness, feels much better wants to go home today.     Objective   Blood pressure 119/87, pulse 40, temperature 97.7 F (36.5 C), temperature source Oral, resp. rate 18, height 5\' 2"  (1.575 m), weight 58.1 kg (128 lb 1.4 oz), SpO2 91 %.   Intake/Output Summary (Last 24 hours) at 10/13/15 0911 Last data filed at 10/13/15 0848  Gross per 24 hour  Intake   3350 ml  Output    1300 ml  Net   2050 ml    Exam Awake Alert, Oriented x 3, No new F.N deficits, Normal affect [place].AT,PERRAL Supple Neck,No JVD, No cervical lymphadenopathy appriciated.  Symmetrical Chest wall movement, Good air movement bilaterally, CTAB RRR,No Gallops,Rubs or new Murmurs, No Parasternal Heave +ve B.Sounds, Abd Soft, Non tender, No organomegaly appriciated, No rebound -guarding or rigidity. No Cyanosis, Clubbing or edema, No new Rash or bruise   Data Review   CBC w Diff: Lab Results  Component Value Date   WBC 8.3 10/12/2015   WBC 9.6 01/28/2014   HGB 12.7 10/12/2015   HGB 13.8 01/28/2014   HCT 37.0 10/12/2015   HCT 41.9 01/28/2014   PLT 243 10/12/2015   PLT 414 01/28/2014   LYMPHOPCT 27 10/11/2015   LYMPHOPCT 33.1 10/16/2013   MONOPCT 6 10/11/2015   MONOPCT 11.6 10/16/2013   EOSPCT 0 10/11/2015   EOSPCT 0.5 10/16/2013   BASOPCT 0 10/11/2015   BASOPCT 0.6 10/16/2013    CMP: Lab Results  Component Value Date   NA 138 10/12/2015   NA 137 01/28/2014   K 3.4* 10/12/2015   K 4.8 01/28/2014   CL 103 10/12/2015   CL 105 01/28/2014   CO2 23 10/12/2015   CO2 23 01/28/2014   BUN 6 10/12/2015   BUN 15 01/28/2014   CREATININE 0.63 10/12/2015   CREATININE 0.77 01/28/2014   PROT 6.5 10/11/2015   PROT 8.2 01/28/2014   ALBUMIN 3.4* 10/11/2015   ALBUMIN 3.9 01/28/2014   BILITOT 1.6* 10/11/2015   BILITOT 0.8 01/28/2014   ALKPHOS 77 10/11/2015   ALKPHOS 112 01/28/2014   AST 25 10/11/2015   AST 38* 01/28/2014   ALT 11* 10/11/2015   ALT 24 01/28/2014  .   Total Time in preparing paper work, data evaluation and todays exam - 35 minutes  Thurnell Lose M.D on 10/13/2015 at [facility]   Office  [phone]

## 2015-10-13 NOTE — Progress Notes (Signed)
CSW received consult regarding transportation. Patient does not have a ride home and no money for a taxi. Patient reported that none of her family members drive.  CSW provided taxi voucher. CSW signing off.  Lindsey [name] LCSWA [phone]

## 2015-10-13 NOTE — Progress Notes (Signed)
Nsg Discharge Note  Admit Date:  10/10/2015 Discharge date: 10/13/2015   Lindsey [name] to be D/C'd Home per MD order.  AVS completed.  Copy for chart, and copy for patient signed, and dated. Patient/caregiver able to verbalize understanding.  Discharge Medication:   Medication List    TAKE these medications        albuterol 108 (90 Base) MCG/ACT inhaler  Commonly known as:  PROVENTIL HFA;VENTOLIN HFA  Inhale 2 puffs into the lungs every 6 (six) hours as needed for wheezing or shortness of breath.     cefpodoxime 200 MG tablet  Commonly known as:  VANTIN  Take 1 tablet (200 mg total) by mouth 2 (two) times daily.     divalproex 500 MG DR tablet  Commonly known as:  DEPAKOTE  Take 1 tablet (500 mg total) by mouth every 12 (twelve) hours.     feeding supplement (ENSURE COMPLETE) Liqd  Take 237 mLs by mouth 2 (two) times daily between meals.     folic acid 1 MG tablet  Commonly known as:  FOLVITE  Take 1 tablet (1 mg total) by mouth daily.     levETIRAcetam 750 MG tablet  Commonly known as:  KEPPRA  Take 1 tablet (750 mg total) by mouth 2 (two) times daily.     LORazepam 2 MG tablet  Commonly known as:  ATIVAN  Take 1 tablet (2 mg total) by mouth every 6 (six) hours as needed for anxiety.     metoprolol tartrate 25 MG tablet  Commonly known as:  LOPRESSOR  Take 3 tablets (75 mg total) by mouth 2 (two) times daily.     metroNIDAZOLE 500 MG tablet  Commonly known as:  FLAGYL  Take 1 tablet (500 mg total) by mouth 3 (three) times daily.     multivitamin with minerals Tabs tablet  Take 1 tablet by mouth daily.     mupirocin ointment 2 %  Commonly known as:  BACTROBAN  Place 1 application into the nose 2 (two) times daily.     pantoprazole 40 MG tablet  Commonly known as:  PROTONIX  Take 1 tablet (40 mg total) by mouth 2 (two) times daily.     thiamine 100 MG tablet  Take 1 tablet (100 mg total) by mouth daily.     valACYclovir 1000 MG tablet  Commonly known as:   VALTREX  Take 1 tablet (1,000 mg total) by mouth 3 (three) times daily.     vancomycin 50 mg/mL oral solution  Commonly known as:  VANCOCIN  Take 5 mLs (250 mg total) by mouth every 6 (six) hours.     VITAMIN D PO  Take 1 tablet by mouth daily. Reported on 10/12/2015        Discharge Assessment: Filed Vitals:   10/13/15 0759 10/13/15 0912  BP: 119/87   Pulse: 40 79  Temp:    Resp: 18    Skin clean, dry and intact without evidence of skin break down, no evidence of skin tears noted. IV catheter discontinued intact. Site without signs and symptoms of complications - no redness or edema noted at insertion site, patient denies c/o pain - only slight tenderness at site.  Dressing with slight pressure applied.  D/c Instructions-Education: Discharge instructions given to patient/family with verbalized understanding. D/c education completed with patient/family including follow up instructions, medication list, d/c activities limitations if indicated, with other d/c instructions as indicated by MD - patient able to verbalize understanding, all questions fully answered.  Patient instructed to return to ED, call 911, or call MD for any changes in condition.  Patient escorted via [place], and D/C home via Taxi.   Dayle Points, RN 10/13/2015 4:04 PM
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
## 2015-10-13 NOTE — Care Management Note (Signed)
Case Management Note  Patient Details  Name: Lindsey [name] MRN: [id number] Date of Birth: 1956-09-13  Subjective/Objective:                  Date- 10-13-15 Initial Assessment Spoke with patient at the bedside.  Introduced self as Tourist information centre manager and explained role in discharge planning and how to be reached.  Verified patient lives in  [place] in [place] with her daughter..  Verified patient anticipates to go home with daughter at time of discharge and will have limited supervision by daughter at this time to best of their knowledge.  Patient has DME cane and wheelchair of her daughter's that she uses, and will receive a rolling walker and 3 in 1 prior to discharge.  Patient denied needing help with their medication.  Patient uses [facility]  to MD appointments.  Verified patient has PCP [facility] in [place] . Patient states they currently receive [facility] services through no one.   Admission Comments:   Carles Collet RN BSN CM [phone]   Action/Plan:  Patient does not qualify for [facility] PT through medicare,  Patient provided with application for CAPS program. Patient stated "I can do it," in reference to managing safely at home.  Expected Discharge Date:                  Expected Discharge Plan:  Home/Self Care  In-House Referral:     Discharge planning Services  CM Consult  Post Acute Care Choice:    Choice offered to:     DME Arranged:    DME Agency:     HH Arranged:    [place] Agency:     Status of Service:  Completed, signed off  Medicare Important Message Given:    Date Medicare IM Given:    Medicare IM give by:    Date Additional Medicare IM Given:    Additional Medicare Important Message give by:     If discussed at [place] of Stay Meetings, dates discussed:    Additional Comments:  Carles Collet, RN 10/13/2015, 12:57 PM

## 2015-10-13 NOTE — Discharge Instructions (Signed)
Do not drive, operating heavy machinery, perform activities at heights, swimming or participation in water activities or provide baby sitting services until you have seen by Primary MD or a Neurologist and advised to do so again.  Follow with Primary MD in 7 days   Get CBC, CMP, 2 view Chest X ray checked  by Primary MD next visit.    Activity: As tolerated with Full fall precautions use walker/cane & assistance as needed   Disposition Home     Diet:   Heart Healthy with feeding assistance and aspiration precautions.  For Heart failure patients - Check your Weight same time everyday, if you gain over 2 pounds, or you develop in leg swelling, experience more shortness of breath or chest pain, call your Primary MD immediately. Follow Cardiac Low Salt Diet and 1.5 lit/day fluid restriction.   On your next visit with your primary care physician please Get Medicines reviewed and adjusted.   Please request your Prim.MD to go over all Hospital Tests and Procedure/Radiological results at the follow up, please get all Hospital records sent to your Prim MD by signing hospital release before you go home.   If you experience worsening of your admission symptoms, develop shortness of breath, life threatening emergency, suicidal or homicidal thoughts you must seek medical attention immediately by calling 911 or calling your MD immediately  if symptoms less severe.  You Must read complete instructions/literature along with all the possible adverse reactions/side effects for all the Medicines you take and that have been prescribed to you. Take any new Medicines after you have completely understood and accpet all the possible adverse reactions/side effects.   Do not drive when taking Pain medications.    Do not take more than prescribed Pain, Sleep and Anxiety Medications  Special Instructions: If you have smoked or chewed Tobacco  in the last 2 yrs please stop smoking, stop any regular Alcohol  and  or any Recreational drug use.  Wear Seat belts while driving.   Please note  You were cared for by a hospitalist during your hospital stay. If you have any questions about your discharge medications or the care you received while you were in the hospital after you are discharged, you can call the unit and asked to speak with the hospitalist on call if the hospitalist that took care of you is not available. Once you are discharged, your primary care physician will handle any further medical issues. Please note that NO REFILLS for any discharge medications will be authorized once you are discharged, as it is imperative that you return to your primary care physician (or establish a relationship with a primary care physician if you do not have one) for your aftercare needs so that they can reassess your need for medications and monitor your lab values.

## 2015-10-16 LAB — CULTURE, BLOOD (ROUTINE X 2)
CULTURE: NO GROWTH
Culture: NO GROWTH

## 2015-11-02 ENCOUNTER — Emergency Department (HOSPITAL_COMMUNITY): Payer: Medicaid Other

## 2015-11-02 ENCOUNTER — Inpatient Hospital Stay (HOSPITAL_COMMUNITY)
Admission: EM | Admit: 2015-11-02 | Discharge: 2015-11-06 | DRG: 100 | Disposition: A | Discharge status: Home / Self Care | Payer: Medicaid Other | Attending: Internal Medicine | Admitting: Internal Medicine

## 2015-11-02 ENCOUNTER — Encounter (HOSPITAL_COMMUNITY): Payer: Self-pay | Admitting: Physical Medicine and Rehabilitation

## 2015-11-02 DIAGNOSIS — Z91018 Allergy to other foods: Secondary | ICD-10-CM

## 2015-11-02 DIAGNOSIS — Z9289 Personal history of other medical treatment: Secondary | ICD-10-CM

## 2015-11-02 DIAGNOSIS — R443 Hallucinations, unspecified: Secondary | ICD-10-CM | POA: Diagnosis not present

## 2015-11-02 DIAGNOSIS — J45909 Unspecified asthma, uncomplicated: Secondary | ICD-10-CM | POA: Diagnosis present

## 2015-11-02 DIAGNOSIS — Z79899 Other long term (current) drug therapy: Secondary | ICD-10-CM

## 2015-11-02 DIAGNOSIS — Z885 Allergy status to narcotic agent status: Secondary | ICD-10-CM

## 2015-11-02 DIAGNOSIS — J9601 Acute respiratory failure with hypoxia: Secondary | ICD-10-CM | POA: Diagnosis not present

## 2015-11-02 DIAGNOSIS — G9340 Encephalopathy, unspecified: Secondary | ICD-10-CM | POA: Diagnosis not present

## 2015-11-02 DIAGNOSIS — I10 Essential (primary) hypertension: Secondary | ICD-10-CM | POA: Diagnosis present

## 2015-11-02 DIAGNOSIS — F23 Brief psychotic disorder: Secondary | ICD-10-CM | POA: Diagnosis present

## 2015-11-02 DIAGNOSIS — F22 Delusional disorders: Secondary | ICD-10-CM | POA: Diagnosis present

## 2015-11-02 DIAGNOSIS — G40909 Epilepsy, unspecified, not intractable, without status epilepticus: Secondary | ICD-10-CM | POA: Diagnosis present

## 2015-11-02 DIAGNOSIS — Z9114 Patient's other noncompliance with medication regimen: Secondary | ICD-10-CM

## 2015-11-02 DIAGNOSIS — F259 Schizoaffective disorder, unspecified: Secondary | ICD-10-CM | POA: Diagnosis present

## 2015-11-02 DIAGNOSIS — F1010 Alcohol abuse, uncomplicated: Secondary | ICD-10-CM | POA: Diagnosis present

## 2015-11-02 DIAGNOSIS — F1490 Cocaine use, unspecified, uncomplicated: Secondary | ICD-10-CM | POA: Diagnosis present

## 2015-11-02 DIAGNOSIS — T426X6A Underdosing of other antiepileptic and sedative-hypnotic drugs, initial encounter: Secondary | ICD-10-CM | POA: Diagnosis present

## 2015-11-02 DIAGNOSIS — G40901 Epilepsy, unspecified, not intractable, with status epilepticus: Principal | ICD-10-CM | POA: Diagnosis present

## 2015-11-02 DIAGNOSIS — F29 Unspecified psychosis not due to a substance or known physiological condition: Secondary | ICD-10-CM | POA: Diagnosis present

## 2015-11-02 HISTORY — DX: Unspecified convulsions: R56.9

## 2015-11-02 LAB — COMPREHENSIVE METABOLIC PANEL
ALT: 11 U/L — ABNORMAL LOW (ref 14–54)
ANION GAP: 14 (ref 5–15)
AST: 20 U/L (ref 15–41)
Albumin: 4.2 g/dL (ref 3.5–5.0)
Alkaline Phosphatase: 88 U/L (ref 38–126)
BILIRUBIN TOTAL: 1.2 mg/dL (ref 0.3–1.2)
BUN: 14 mg/dL (ref 6–20)
CO2: 24 mmol/L (ref 22–32)
Calcium: 9.6 mg/dL (ref 8.9–10.3)
Chloride: 102 mmol/L (ref 101–111)
Creatinine, Ser: 0.84 mg/dL (ref 0.44–1.00)
Glucose, Bld: 91 mg/dL (ref 65–99)
POTASSIUM: 4.4 mmol/L (ref 3.5–5.1)
Sodium: 140 mmol/L (ref 135–145)
TOTAL PROTEIN: 7.8 g/dL (ref 6.5–8.1)

## 2015-11-02 LAB — POCT I-STAT 3, ART BLOOD GAS (G3+)
ACID-BASE EXCESS: 1 mmol/L (ref 0.0–2.0)
BICARBONATE: 24.9 meq/L — AB (ref 20.0–24.0)
O2 Saturation: 100 %
PO2 ART: 243 mmHg — AB (ref 80.0–100.0)
TCO2: 26 mmol/L (ref 0–100)
pCO2 arterial: 41 mmHg (ref 35.0–45.0)
pH, Arterial: 7.399 (ref 7.350–7.450)

## 2015-11-02 LAB — CBC WITH DIFFERENTIAL/PLATELET
BASOS ABS: 0 10*3/uL (ref 0.0–0.1)
Basophils Relative: 0 %
Eosinophils Absolute: 0 10*3/uL (ref 0.0–0.7)
Eosinophils Relative: 0 %
HEMATOCRIT: 43.8 % (ref 36.0–46.0)
Hemoglobin: 15.1 g/dL — ABNORMAL HIGH (ref 12.0–15.0)
LYMPHS ABS: 2.1 10*3/uL (ref 0.7–4.0)
LYMPHS PCT: 17 %
MCH: 31.1 pg (ref 26.0–34.0)
MCHC: 34.5 g/dL (ref 30.0–36.0)
MCV: 90.1 fL (ref 78.0–100.0)
MONOS PCT: 6 %
Monocytes Absolute: 0.7 10*3/uL (ref 0.1–1.0)
Neutro Abs: 9.4 10*3/uL — ABNORMAL HIGH (ref 1.7–7.7)
Neutrophils Relative %: 77 %
PLATELETS: 268 10*3/uL (ref 150–400)
RBC: 4.86 MIL/uL (ref 3.87–5.11)
RDW: 13.5 % (ref 11.5–15.5)
WBC: 12.2 10*3/uL — AB (ref 4.0–10.5)

## 2015-11-02 LAB — MRSA PCR SCREENING: MRSA by PCR: NEGATIVE

## 2015-11-02 LAB — RAPID URINE DRUG SCREEN, HOSP PERFORMED
Amphetamines: NOT DETECTED
Barbiturates: NOT DETECTED
Benzodiazepines: NOT DETECTED
COCAINE: NOT DETECTED
Opiates: NOT DETECTED
TETRAHYDROCANNABINOL: POSITIVE — AB

## 2015-11-02 LAB — URINE MICROSCOPIC-ADD ON
Bacteria, UA: NONE SEEN
SQUAMOUS EPITHELIAL / LPF: NONE SEEN
WBC UA: NONE SEEN WBC/hpf (ref 0–5)

## 2015-11-02 LAB — URINALYSIS, ROUTINE W REFLEX MICROSCOPIC
Bilirubin Urine: NEGATIVE
GLUCOSE, UA: NEGATIVE mg/dL
Ketones, ur: NEGATIVE mg/dL
LEUKOCYTES UA: NEGATIVE
Nitrite: NEGATIVE
PH: 6.5 (ref 5.0–8.0)
PROTEIN: 30 mg/dL — AB
SPECIFIC GRAVITY, URINE: 1.019 (ref 1.005–1.030)

## 2015-11-02 LAB — TRIGLYCERIDES: TRIGLYCERIDES: 57 mg/dL (ref <150)

## 2015-11-02 LAB — ETHANOL: Alcohol, Ethyl (B): <5 mg/dL (ref <5)

## 2015-11-02 LAB — LACTIC ACID, PLASMA: LACTIC ACID, VENOUS: 2.7 mmol/L — AB (ref 0.5–2.0)

## 2015-11-02 MED ORDER — VALPROATE SODIUM 500 MG/5ML IV SOLN
1.0000 g | Freq: Once | INTRAVENOUS | Status: AC
  Administered 2015-11-02: 1000 mg via INTRAVENOUS
  Filled 2015-11-02: qty 10

## 2015-11-02 MED ORDER — THIAMINE HCL 100 MG/ML IJ SOLN
100.0000 mg | Freq: Every day | INTRAMUSCULAR | Status: DC
  Administered 2015-11-02 – 2015-11-03 (×2): 100 mg via INTRAVENOUS
  Filled 2015-11-02 (×3): qty 2

## 2015-11-02 MED ORDER — HALOPERIDOL LACTATE 5 MG/ML IJ SOLN: 5.0000 mg | Freq: Once | INTRAMUSCULAR | Status: DC

## 2015-11-02 MED ORDER — LORAZEPAM 2 MG/ML IJ SOLN
2.0000 mg | Freq: Once | INTRAMUSCULAR | Status: AC
  Administered 2015-11-02: 2 mg via INTRAMUSCULAR
  Filled 2015-11-02: qty 1

## 2015-11-02 MED ORDER — MIDAZOLAM HCL 5 MG/ML IJ SOLN
1.0000 mg/h | INTRAMUSCULAR | Status: DC
  Administered 2015-11-02: 15 mg/h via INTRAVENOUS
  Filled 2015-11-02: qty 10

## 2015-11-02 MED ORDER — PANTOPRAZOLE SODIUM 40 MG IV SOLR
40.0000 mg | INTRAVENOUS | Status: DC
  Administered 2015-11-02 – 2015-11-03 (×2): 40 mg via INTRAVENOUS
  Filled 2015-11-02 (×2): qty 40

## 2015-11-02 MED ORDER — PROPOFOL 10 MG/ML IV BOLUS
100.0000 mg | Freq: Once | INTRAVENOUS | Status: AC
  Administered 2015-11-02: 100 mg via INTRAVENOUS

## 2015-11-02 MED ORDER — PROPOFOL 1000 MG/100ML IV EMUL
5.0000 ug/kg/min | INTRAVENOUS | Status: DC
  Administered 2015-11-02: 80 ug/kg/min via INTRAVENOUS
  Administered 2015-11-02: 10 ug/kg/min via INTRAVENOUS
  Administered 2015-11-02: 80 ug/kg/min via INTRAVENOUS
  Administered 2015-11-03: 40 ug/kg/min via INTRAVENOUS
  Filled 2015-11-02 (×4): qty 100

## 2015-11-02 MED ORDER — HEPARIN SODIUM (PORCINE) 5000 UNIT/ML IJ SOLN
5000.0000 [IU] | Freq: Three times a day (TID) | INTRAMUSCULAR | Status: DC
  Administered 2015-11-02 – 2015-11-04 (×5): 5000 [IU] via SUBCUTANEOUS
  Filled 2015-11-02 (×5): qty 1

## 2015-11-02 MED ORDER — ETOMIDATE 2 MG/ML IV SOLN
20.0000 mg | Freq: Once | INTRAVENOUS | Status: AC
  Administered 2015-11-02: 20 mg via INTRAVENOUS

## 2015-11-02 MED ORDER — IPRATROPIUM-ALBUTEROL 0.5-2.5 (3) MG/3ML IN SOLN
3.0000 mL | Freq: Four times a day (QID) | RESPIRATORY_TRACT | Status: DC
  Administered 2015-11-02 – 2015-11-03 (×3): 3 mL via RESPIRATORY_TRACT
  Filled 2015-11-02 (×3): qty 3

## 2015-11-02 MED ORDER — SODIUM CHLORIDE 0.9 % IV SOLN
500.0000 mg | Freq: Once | INTRAVENOUS | Status: DC
  Filled 2015-11-02: qty 5

## 2015-11-02 MED ORDER — HALOPERIDOL LACTATE 5 MG/ML IJ SOLN: 5.0000 mg | INTRAMUSCULAR | Status: DC | PRN

## 2015-11-02 MED ORDER — SUCCINYLCHOLINE CHLORIDE 20 MG/ML IJ SOLN: 60.0000 mg | Freq: Once | INTRAMUSCULAR | Status: DC

## 2015-11-02 MED ORDER — CETYLPYRIDINIUM CHLORIDE 0.05 % MT LIQD: 7.0000 mL | OROMUCOSAL | Status: DC | PRN

## 2015-11-02 MED ORDER — DIPHENHYDRAMINE HCL 50 MG/ML IJ SOLN
25.0000 mg | Freq: Once | INTRAMUSCULAR | Status: AC
  Administered 2015-11-02: 25 mg via INTRAVENOUS
  Filled 2015-11-02: qty 1

## 2015-11-02 MED ORDER — ROCURONIUM BROMIDE 50 MG/5ML IV SOLN
1.0000 mg/kg | Freq: Once | INTRAVENOUS | Status: DC
  Filled 2015-11-02: qty 5.9

## 2015-11-02 MED ORDER — SODIUM CHLORIDE 0.9 % IV SOLN
Freq: Once | INTRAVENOUS | Status: AC
  Administered 2015-11-02: 14:00:00 via INTRAVENOUS

## 2015-11-02 MED ORDER — LORAZEPAM 2 MG/ML IJ SOLN
2.0000 mg | Freq: Once | INTRAMUSCULAR | Status: AC
  Administered 2015-11-02: 2 mg via INTRAVENOUS

## 2015-11-02 MED ORDER — VALPROATE SODIUM 500 MG/5ML IV SOLN
500.0000 mg | Freq: Two times a day (BID) | INTRAVENOUS | Status: DC
  Administered 2015-11-03 (×2): 500 mg via INTRAVENOUS
  Filled 2015-11-02 (×4): qty 5

## 2015-11-02 MED ORDER — LORAZEPAM 2 MG/ML IJ SOLN
INTRAMUSCULAR | Status: AC
  Filled 2015-11-02: qty 1

## 2015-11-02 MED ORDER — SODIUM CHLORIDE 0.9 % IV SOLN
1.0000 mg/h | INTRAVENOUS | Status: DC
  Administered 2015-11-02: 15 mg/h via INTRAVENOUS
  Filled 2015-11-02: qty 20

## 2015-11-02 MED ORDER — SODIUM CHLORIDE 0.9 % IV SOLN
75.0000 mL/h | INTRAVENOUS | Status: DC
  Administered 2015-11-02 – 2015-11-04 (×3): 75 mL/h via INTRAVENOUS

## 2015-11-02 MED ORDER — SODIUM CHLORIDE 0.9 % IV SOLN
0.0000 mg/h | INTRAVENOUS | Status: DC
  Filled 2015-11-02 (×2): qty 10

## 2015-11-02 MED ORDER — CHLORHEXIDINE GLUCONATE 0.12 % MT SOLN
15.0000 mL | OROMUCOSAL | Status: DC
  Administered 2015-11-02: 15 mL via OROMUCOSAL

## 2015-11-02 MED ORDER — ALBUTEROL SULFATE (2.5 MG/3ML) 0.083% IN NEBU: 2.5000 mg | INHALATION_SOLUTION | RESPIRATORY_TRACT | Status: DC | PRN

## 2015-11-02 MED ORDER — HALOPERIDOL LACTATE 5 MG/ML IJ SOLN
5.0000 mg | Freq: Once | INTRAMUSCULAR | Status: AC
  Administered 2015-11-02: 5 mg via INTRAMUSCULAR
  Filled 2015-11-02: qty 1

## 2015-11-02 MED ORDER — ROCURONIUM BROMIDE 50 MG/5ML IV SOLN
1.0000 mg/kg | Freq: Once | INTRAVENOUS | Status: AC
  Administered 2015-11-02: 60 mg via INTRAVENOUS

## 2015-11-02 MED ORDER — SODIUM CHLORIDE 0.9 % IV SOLN
1000.0000 mg | Freq: Once | INTRAVENOUS | Status: AC
  Administered 2015-11-02: 1000 mg via INTRAVENOUS
  Filled 2015-11-02: qty 10

## 2015-11-02 NOTE — ED Notes (Signed)
RSI successful, positive color change, bilateral breath sounds. 23 @ lip.

## 2015-11-02 NOTE — ED Notes (Signed)
CCMD at bedside. States, does not feel a need for central line at this time.

## 2015-11-02 NOTE — Progress Notes (Signed)
[facility] Progress Note Patient Name: Lindsey [name] DOB: 1956/05/26 MRN: [id number]   Date of Service  11/02/2015  HPI/Events of Note  Informed that patient has not had an ABG since intubation.   eICU Interventions  Will order ABG for 9 PM.      Intervention Category Major Interventions: Respiratory failure - evaluation and management  [name] [name] 11/02/2015, 8:19 PM

## 2015-11-02 NOTE — Consult Note (Signed)
PULMONARY / CRITICAL CARE MEDICINE   Name: Lindsey [name] MRN: [id number] DOB: 11-Nov-1955    ADMISSION DATE:  11/02/2015 CONSULTATION DATE:  11/02/15   REFERRING MD:  EDP   CHIEF COMPLAINT:   Status Epilepticus   HISTORY OF PRESENT ILLNESS:   Lindsey [name] is a 60 y.o.female reported seizures per EMS, She had recent admission for seizures .  She has a history of EtOH abuse and is 0 today. She was combative and post-ictal on arrival to ER .  She then had another seizure after arrival to ER.  She repeatedly has presented in status with fever associated with it and has had negative LPs in past. Seen by Neuro started on Propofol and Keppra. She was intubated for airway protection. Tox screen in past for THC .  PCCM consulted.    PAST MEDICAL HISTORY :  She  has a past medical history of Seizures ([place]); Asthma; H/O ETOH abuse; and Seizure ([place]).  PAST SURGICAL HISTORY: She  has past surgical history that includes Abdominal hysterectomy.  Allergies  Allergen Reactions  . Morphine And Related Hives  . Penicillins Nausea Only    Has patient had a PCN reaction causing immediate rash, facial/tongue/throat swelling, SOB or lightheadedness with hypotension: No Has patient had a PCN reaction causing severe rash involving mucus membranes or skin necrosis: No Has patient had a PCN reaction that required hospitalization No Has patient had a PCN reaction occurring within the last 10 years: No If all of the above answers are "NO", then may proceed with Cephalosporin use.  . Strawberry Extract Hives and Itching    No current facility-administered medications on file prior to encounter.   Current Outpatient Prescriptions on File Prior to Encounter  Medication Sig  . albuterol (PROVENTIL HFA;VENTOLIN HFA) 108 (90 BASE) MCG/ACT inhaler Inhale 2 puffs into the lungs every 6 (six) hours as needed for wheezing or shortness of breath. (Patient not taking: Reported on 11/02/2015)  . cefpodoxime (VANTIN)  200 MG tablet Take 1 tablet (200 mg total) by mouth 2 (two) times daily.  . Cholecalciferol (VITAMIN D PO) Take 1 tablet by mouth daily. Reported on 10/12/2015  . divalproex (DEPAKOTE) 500 MG DR tablet Take 1 tablet (500 mg total) by mouth every 12 (twelve) hours.  . feeding supplement, ENSURE COMPLETE, (ENSURE COMPLETE) LIQD Take 237 mLs by mouth 2 (two) times daily between meals.  . folic acid (FOLVITE) 1 MG tablet Take 1 tablet (1 mg total) by mouth daily.  Marland Kitchen levETIRAcetam (KEPPRA) 750 MG tablet Take 1 tablet (750 mg total) by mouth 2 (two) times daily.  Marland Kitchen LORazepam (ATIVAN) 2 MG tablet Take 1 tablet (2 mg total) by mouth every 6 (six) hours as needed for anxiety. (Patient taking differently: Take 2 mg by mouth every 8 (eight) hours as needed for anxiety. )  . Metoprolol Tartrate (LOPRESSOR) 25 MG tablet Take 3 tablets (75 mg total) by mouth 2 (two) times daily.  . metroNIDAZOLE (FLAGYL) 500 MG tablet Take 1 tablet (500 mg total) by mouth 3 (three) times daily.  . Multiple Vitamin (MULTIVITAMIN WITH MINERALS) TABS tablet Take 1 tablet by mouth daily.  . mupirocin ointment (BACTROBAN) 2 % Place 1 application into the nose 2 (two) times daily.  . pantoprazole (PROTONIX) 40 MG tablet Take 1 tablet (40 mg total) by mouth 2 (two) times daily.  Marland Kitchen thiamine 100 MG tablet Take 1 tablet (100 mg total) by mouth daily.  . valACYclovir (VALTREX) 1000 MG tablet Take 1 tablet (  1,000 mg total) by mouth 3 (three) times daily.    FAMILY HISTORY:  Her has no family status information on file.   SOCIAL HISTORY: She  reports that she has never smoked. She has never used smokeless tobacco. She reports that she drinks alcohol. She reports that she uses illicit drugs (Marijuana).  REVIEW OF SYSTEMS:   Unresponsive on vent   SUBJECTIVE:    VITAL SIGNS: BP 140/91 mmHg  Pulse 93  Temp(Src) 101.5 F (38.6 C) (Rectal)  Resp 24  Ht 5\' 2"  (1.575 m)  Wt 130 lb (58.968 kg)  BMI 23.77 kg/m2  SpO2  100%  HEMODYNAMICS:    VENTILATOR SETTINGS: Vent Mode:  [-] PRVC FiO2 (%):  [60 %] 60 % Set Rate:  [16 bmp] 16 bmp Vt Set:  [500 mL] 500 mL PEEP:  [5 cmH20] 5 cmH20  INTAKE / OUTPUT:    PHYSICAL EXAMINATION: General:  Agitated on vent  Neuro:  Agitated  HEENT:  ETT  Cardiovascular:  Tachy  Lungs:  Decreased BS in bases  Abdomen:  Soft , hypoactive BS  Musculoskeletal:  Intact  Skin:  Intact   LABS:  BMET  Recent Labs Lab 11/02/15 1340  NA 140  K 4.4  CL 102  CO2 24  BUN 14  CREATININE 0.84  GLUCOSE 91    Electrolytes  Recent Labs Lab 11/02/15 1340  CALCIUM 9.6    CBC  Recent Labs Lab 11/02/15 1340  WBC 12.2*  HGB 15.1*  HCT 43.8  PLT 268    Coag's No results for input(s): APTT, INR in the last 168 hours.  Sepsis Markers  Recent Labs Lab 11/02/15 1340  LATICACIDVEN 2.7*    ABG No results for input(s): PHART, PCO2ART, PO2ART in the last 168 hours.  Liver Enzymes  Recent Labs Lab 11/02/15 1340  AST 20  ALT 11*  ALKPHOS 88  BILITOT 1.2  ALBUMIN 4.2    Cardiac Enzymes No results for input(s): TROPONINI, PROBNP in the last 168 hours.  Glucose No results for input(s): GLUCAP in the last 168 hours.  Imaging Dg Chest Portable 1 View  11/02/2015  CLINICAL DATA:  Patient status post intubation.  Multiple seizures. EXAM: PORTABLE CHEST 1 VIEW COMPARISON:  Chest radiograph 11/02/2015 FINDINGS: ET tube terminates in the mid trachea. Normal cardiac and mediastinal contours. No consolidative pulmonary opacities. No pleural effusion or pneumothorax. Regional skeleton is unremarkable. IMPRESSION: ETT terminates in the mid trachea. No acute cardiopulmonary process. Electronically Signed   By: Lovey Newcomer M.D.   On: 11/02/2015 14:45   Dg Chest Portable 1 View  11/02/2015  CLINICAL DATA:  Multiple seizures for 2 days; pt combative and unable to follow commands EXAM: PORTABLE CHEST 1 VIEW COMPARISON:  10/10/2015 FINDINGS: The heart size and  mediastinal contours are within normal limits. Both lungs are clear. The visualized skeletal structures are unremarkable. IMPRESSION: No active disease. Electronically Signed   By: Nolon Nations M.D.   On: 11/02/2015 14:26     STUDIES:  CT head 10/10/15 neg for acute   CULTURES: BC x 2 2/5 >> UC 2/5 >>  ANTIBIOTICS:   SIGNIFICANT EVENTS:   LINES/TUBES: 2/5 ETT >>  DISCUSSION: 60 yo female with ETOH abuse presents with recurrent status epilepticus   ASSESSMENT / PLAN:  PULMONARY A: Acute Resp Failure secondary to status epilepticus   P:   Vent support-for now until mentation improves  Evaluate for extubation in am .  CXR in am    CARDIOVASCULAR  A: HTN   P:  Hold Metoprolol - cocaine, with continuous use should probably not be discharged on beta blockers at all.  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  Has had multiple negative LPs in the past.  Non-compliant with medications.  PCCM will admit to the ICU, neuro service will address seizure.  Will d/c her beta blockers at this time and probably should be discharged on beta blockers given her habitual cocaine use.  On exam, lungs are clear and patient withdraws all ext to pain.  Will check ABG and adjust vent for gas.  PCCM will take over as  primary.  The patient is critically ill with multiple organ systems failure and requires high complexity decision making for assessment and support, frequent evaluation and titration of therapies, application of advanced monitoring technologies and extensive interpretation of multiple databases.   Critical Care Time devoted to patient care services described in this note is  35  Minutes. This time reflects time of care of this signee Dr Jennet Maduro. This critical care time does not reflect procedure time, or teaching time or supervisory time of PA/NP/Med student/Med Resident etc but could involve care discussion time.  Rush Farmer, M.D. [facility] Pulmonary/Critical Care Medicine. Pager: [phone]. After hours pager: [phone].  11/02/2015, 4:25 PM

## 2015-11-02 NOTE — ED Notes (Signed)
Attempting to establish 2nd peripheral IV access.

## 2015-11-02 NOTE — Progress Notes (Signed)
[facility] Progress Note Patient Name: Lindsey [name] DOB: April 07, 1956 MRN: [id number]   Date of Service  11/02/2015  HPI/Events of Note  Notified of need for Stress Ulcer Prophylaxis.   eICU Interventions  Will order Protonix IV.      Intervention Category Intermediate Interventions: Best-practice therapies (e.g. DVT, beta blocker, etc.)  [name] 11/02/2015, 8:11 PM

## 2015-11-02 NOTE — ED Notes (Signed)
Lactic acid 2.7 critical value, nurse notified.

## 2015-11-02 NOTE — Progress Notes (Signed)
ABG collected  

## 2015-11-02 NOTE — ED Notes (Signed)
Pt moved to Trauma C, preparing for RSI.

## 2015-11-02 NOTE — H&P (Signed)
Neurology Consultation Reason for Consult: SEizures Referring Physician: beaton  CC: Seizures  History is obtained from: chart, referring provider  HPI: Lindsey [name] is a 60 y.o. female reported seizures per EMS, She had seizures prior to arrival. She has a history of EtOH abuse and is 0 today. She was combative and post-ictal on arrival. She then had another seizure after arrive. She repeatedly has presented in status with fever associated with it and has had negative LPs with these admission.   She currently is on 80 of propofol and still purposeful bilaterally, reaching for the ET tube.    ROS: A 14 point ROS was performed and is negative except as noted in the HPI.   Past Medical History  Diagnosis Date  . Seizures ([place])   . Asthma   . H/O ETOH abuse   . Seizure ([place])     FHx: Unable to assess secondary to patient's altered mental status.   Social History:  reports that she has never smoked. She has never used smokeless tobacco. She reports that she drinks alcohol. She reports that she uses illicit drugs (Marijuana).   Exam: Current vital signs: BP 190/94 mmHg  Pulse 104  Temp(Src) 101.5 F (38.6 C) (Rectal)  Resp 16  Ht 5\' 2"  (1.575 m)  Wt 58.968 kg (130 lb)  BMI 23.77 kg/m2  SpO2 100% Vital signs in last 24 hours: Temp:  [101.5 F (38.6 C)] 101.5 F (38.6 C) (02/05 1308) Pulse Rate:  [91-104] 104 (02/05 1500) Resp:  [16-18] 16 (02/05 1500) BP: (130-190)/(70-94) 190/94 mmHg (02/05 1500) SpO2:  [99 %-100 %] 100 % (02/05 1500) FiO2 (%):  [60 %] 60 % (02/05 1500) Weight:  [58.968 kg (130 lb)] 58.968 kg (130 lb) (02/05 1308)   Physical Exam  Constitutional: Appears older than stated age.  Psych: does not respond Eyes: No scleral injection HENT: ET tube in place Head: Normocephalic.  Cardiovascular: Normal rate and regular rhythm.  Respiratory: Effort normal GI: Soft.  No distension. There is no tenderness.  Skin: WDI   This exam was on 80 of  propofol Neuro: Mental Status: Patient is  lethargic, she has been quite agitated and has been sedated. She is, however purposeful with bilateral arms and withdrawal bilateral legs briskly  Cranial Nerves: II:  does not blink to threat . Pupils are equal, round, and reactive to light.   III,IV, VI: as her midline  V: Facial sensation is symmetric to temperature VII: Facial movement is symmetric.  VIII, X, XI, XII: Unable to assess secondary to patient's altered mental status.  Motor: She moves all extremities purposefully  Sensory He withdraws briskly to noxious stimuli in all 4 extremitiesDeep Tendon Reflexes: Cerebellar: Unable to assess secondary to patient's altered mental status.    I have reviewed labs in epic and the results pertinent to this consultation are: cmp - unremarkable  Impression: 60 year old female with recurrent status epilepticus. She usually presents febrile and has negative CSF, I have low suspicion for CNS infection. A history of substance abuse and alcohol withdrawal, difficult to tell if this is playing a role here. I do think that she will need precautions against alcohol withdrawal.   Recommendations: 1)  Depakote 1 g load and 500 mg twice a day  2) Keppra 750 mg twice a day following 1500 mg load  3) midazolam for sedation, may need large doses as I suspect EtOH withdrawal  4) CIWA, can use midazolam as benzodiazepine.  5) Thiamine    Addison Lank  Leonel Ramsay, MD [facility] [phone]  If 7pm- 7am, please page neurology on call as listed in [place].

## 2015-11-02 NOTE — ED Notes (Signed)
Pt actively having seizure at the time. RN and Dr. Audie Pinto at bedside. Pt placed on [place] 2L. Vital signs stable. Remains on cardiac monitor. Medicated with IVP Ativan. Will continue to monitor closely.

## 2015-11-02 NOTE — Progress Notes (Signed)
Came to assess pt, found pt on generic mechanical ventilation settings with no post-intubation ABG. Pt placed on her 8cc per RT, MD Oletta Darter notified of this regard and MD wanted to place pt on 8cc and take ABG an 1hour. Pt is stable at this time. RT will continue to monitor.

## 2015-11-02 NOTE — ED Notes (Signed)
Pt presents to department via [facility] for evaluation of seizure. Daughter reports multiple seizures over the past x2 days. Hasn't been taking Keppra as directed. Pt postictal and combative upon arrival to ED. CBG 126

## 2015-11-02 NOTE — ED Notes (Signed)
IV team at bedside 

## 2015-11-02 NOTE — Progress Notes (Signed)
[facility] Progress Note Patient Name: Shermica Kaplon DOB: [birth year] MRN: [id number]   Date of Service  11/02/2015  HPI/Events of Note  Agitation - Patient is already on Propofol IV infusion at maximum dose and Versed IV infusion at 15 mg/hour. QTc interval = 500 milliseconds.   eICU Interventions  Will order: 1. Increase ceiling on Versed IV infusion to 20 mg/hour. 2. Haldol 5 mg IV now.      Intervention Category Major Interventions: Delirium, psychosis, severe agitation - evaluation and management  Naftoli Penny [name] 11/02/2015, 9:45 PM

## 2015-11-03 ENCOUNTER — Inpatient Hospital Stay (HOSPITAL_COMMUNITY): Payer: Medicaid Other

## 2015-11-03 DIAGNOSIS — J9601 Acute respiratory failure with hypoxia: Secondary | ICD-10-CM

## 2015-11-03 DIAGNOSIS — I10 Essential (primary) hypertension: Secondary | ICD-10-CM

## 2015-11-03 LAB — POCT I-STAT 3, ART BLOOD GAS (G3+)
Bicarbonate: 24.6 mEq/L — ABNORMAL HIGH (ref 20.0–24.0)
O2 Saturation: 99 %
PCO2 ART: 37.8 mmHg (ref 35.0–45.0)
PH ART: 7.418 (ref 7.350–7.450)
Patient temperature: 97.2
TCO2: 26 mmol/L (ref 0–100)
pO2, Arterial: 135 mmHg — ABNORMAL HIGH (ref 80.0–100.0)

## 2015-11-03 LAB — URINE CULTURE: CULTURE: NO GROWTH

## 2015-11-03 LAB — VALPROIC ACID LEVEL: Valproic Acid Lvl: <10 ug/mL — ABNORMAL LOW (ref 50.0–100.0)

## 2015-11-03 MED ORDER — CETYLPYRIDINIUM CHLORIDE 0.05 % MT LIQD
7.0000 mL | OROMUCOSAL | Status: DC
  Administered 2015-11-03: 7 mL via OROMUCOSAL

## 2015-11-03 MED ORDER — CHLORHEXIDINE GLUCONATE 0.12 % MT SOLN
15.0000 mL | Freq: Two times a day (BID) | OROMUCOSAL | Status: DC
  Administered 2015-11-03: 15 mL via OROMUCOSAL

## 2015-11-03 MED ORDER — ANTISEPTIC ORAL RINSE SOLUTION (CORINZ)
7.0000 mL | OROMUCOSAL | Status: DC
  Administered 2015-11-03 (×4): 7 mL via OROMUCOSAL

## 2015-11-03 NOTE — ED Provider Notes (Signed)
CSN: [id number]     Arrival date & time 11/02/15  1250 History   First MD Initiated Contact with Patient 11/02/15 1300     Chief Complaint  Patient presents with  . Seizures      HPI Patient presents with history of seizure activity at home.  She has long standing history of alcohol abuse and has had history of many seizures in the past.  Patient postictal when she arrived in emergency department.  No family accompanying patient. Past Surgical History  Procedure Laterality Date  . Abdominal hysterectomy     No family history on file. Social History  Substance Use Topics  . Smoking status: Never Smoker   . Smokeless tobacco: Never Used  . Alcohol Use: Yes   OB History    No data available     Review of Systems  Unable to perform ROS: Acuity of condition      Allergies  Kiwi extract; Morphine and related; Penicillins; and Strawberry extract  Home Medications   Prior to Admission medications   Medication Sig Start Date End Date Taking? Authorizing Provider  albuterol (PROAIR HFA) 108 (90 Base) MCG/ACT inhaler Inhale 2 puffs into the lungs every 6 (six) hours as needed for wheezing or shortness of breath.   Yes Historical Provider, MD  divalproex (DEPAKOTE) 500 MG DR tablet Take 1 tablet (500 mg total) by mouth every 12 (twelve) hours. 10/13/15  Yes Thurnell Lose, MD  levETIRAcetam (KEPPRA) 750 MG tablet Take 1 tablet (750 mg total) by mouth 2 (two) times daily. 10/13/15  Yes Thurnell Lose, MD  magnesium oxide (MAG-OX) 400 MG tablet Take 400 mg by mouth 3 (three) times daily.   Yes Historical Provider, MD  pantoprazole (PROTONIX) 40 MG tablet Take 1 tablet (40 mg total) by mouth 2 (two) times daily. 08/19/15  Yes Reyne Dumas, MD  tiotropium (SPIRIVA) 18 MCG inhalation capsule Place 18 mcg into inhaler and inhale daily.   Yes Historical Provider, MD  acetaminophen-codeine (TYLENOL #3) 300-30 MG tablet Take 1 tablet by mouth every 8 (eight) hours as needed for moderate  pain.    Historical Provider, MD  albuterol (PROVENTIL HFA;VENTOLIN HFA) 108 (90 BASE) MCG/ACT inhaler Inhale 2 puffs into the lungs every 6 (six) hours as needed for wheezing or shortness of breath. Patient not taking: Reported on 11/02/2015 01/19/15   Jonetta Osgood, MD  cefpodoxime (VANTIN) 200 MG tablet Take 1 tablet (200 mg total) by mouth 2 (two) times daily. Patient not taking: Reported on 11/03/2015 10/13/15   Thurnell Lose, MD  donepezil (ARICEPT) 10 MG tablet Take 10 mg by mouth daily.    Historical Provider, MD  feeding supplement, ENSURE COMPLETE, (ENSURE COMPLETE) LIQD Take 237 mLs by mouth 2 (two) times daily between meals. Patient not taking: Reported on 11/03/2015 01/19/15   Jonetta Osgood, MD  folic acid (FOLVITE) 1 MG tablet Take 1 tablet (1 mg total) by mouth daily. Patient not taking: Reported on 11/03/2015 01/19/15   Jonetta Osgood, MD  hydrOXYzine (VISTARIL) 25 MG capsule Take 25 mg by mouth 3 (three) times daily as needed for anxiety.    Historical Provider, MD  LORazepam (ATIVAN) 2 MG tablet Take 1 tablet (2 mg total) by mouth every 6 (six) hours as needed for anxiety. Patient not taking: Reported on 11/03/2015 08/25/15   Gerlene Fee, NP  Metoprolol Tartrate (LOPRESSOR) 25 MG tablet Take 3 tablets (75 mg total) by mouth 2 (two) times daily. Patient  not taking: Reported on 11/03/2015 10/13/15   Thurnell Lose, MD  metroNIDAZOLE (FLAGYL) 500 MG tablet Take 1 tablet (500 mg total) by mouth 3 (three) times daily. Patient not taking: Reported on 11/03/2015 10/13/15   Thurnell Lose, MD  mupirocin ointment (BACTROBAN) 2 % Place 1 application into the nose 2 (two) times daily. Patient not taking: Reported on 11/03/2015 10/13/15   Thurnell Lose, MD  thiamine 100 MG tablet Take 1 tablet (100 mg total) by mouth daily. Patient not taking: Reported on 11/03/2015 10/13/15   Thurnell Lose, MD  valACYclovir (VALTREX) 1000 MG tablet Take 1 tablet (1,000 mg total) by mouth 3 (three) times  daily. Patient not taking: Reported on 11/03/2015 10/13/15   Thurnell Lose, MD   BP 149/90 mmHg  Pulse 99  Temp(Src) 98.1 F (36.7 C) (Oral)  Resp 24  Ht 5\' 2"  (1.575 m)  Wt 130 lb (58.968 kg)  BMI 23.77 kg/m2  SpO2 100% Physical Exam  Constitutional: She appears well-developed and well-nourished.  HENT:  Head: Normocephalic and atraumatic.  Eyes: Pupils are equal, round, and reactive to light.  Neck: Normal range of motion.  Cardiovascular: Intact distal pulses.   Pulmonary/Chest: No respiratory distress.  Abdominal: Normal appearance. She exhibits no distension. There is no tenderness.  Musculoskeletal: She exhibits no edema or tenderness.  Neurological: No cranial nerve deficit. GCS eye subscore is 4. GCS verbal subscore is 4. GCS motor subscore is 5.  Patient had active seizures 2.  Skin: Skin is warm and dry. No rash noted.  Nursing note and vitals reviewed.   ED Course  .Intubation Date/Time: 11/02/2015 3:00 PM Performed by: Leonard Schwartz Authorized by: Leonard Schwartz Consent: The procedure was performed in an emergent situation. Verbal consent not obtained. Written consent not obtained. Indications: airway protection Intubation method: video-assisted Patient status: paralyzed (RSI) Sedatives: etomidate Paralytic: rocuronium Laryngoscope size: Miller 3 Tube size: 7.0 mm Tube type: cuffed Number of attempts: 1 Cricoid pressure: no Cords visualized: yes Post-procedure assessment: chest rise and CO2 detector Breath sounds: equal Cuff inflated: yes ETT to lip: 23 cm Tube secured with: ETT holder Chest x-ray interpreted by radiologist. Chest x-ray findings: endotracheal tube in appropriate position Patient tolerance: Patient tolerated the procedure well with no immediate complications   (including critical care time)  CRITICAL CARE Performed by: Leonard Schwartz L Total critical care time: 60 minutes Critical care time was exclusive of separately billable  procedures and treating other patients. Critical care was necessary to treat or prevent imminent or life-threatening deterioration. Critical care was time spent personally by me on the following activities: development of treatment plan with patient and/or surrogate as well as nursing, discussions with consultants, evaluation of patient's response to treatment, examination of patient, obtaining history from patient or surrogate, ordering and performing treatments and interventions, ordering and review of laboratory studies, ordering and review of radiographic studies, pulse oximetry and re-evaluation of patient's condition.  Labs Review Labs Reviewed  COMPREHENSIVE METABOLIC PANEL - Abnormal; Notable for the following:    ALT 11 (*)    All other components within normal limits  CBC WITH DIFFERENTIAL/PLATELET - Abnormal; Notable for the following:    WBC 12.2 (*)    Hemoglobin 15.1 (*)    Neutro Abs 9.4 (*)    All other components within normal limits  VALPROIC ACID LEVEL - Abnormal; Notable for the following:    Valproic Acid Lvl <10 (*)    All other components within normal limits  LACTIC  ACID, PLASMA - Abnormal; Notable for the following:    Lactic Acid, Venous 2.7 (*)    All other components within normal limits  URINALYSIS, ROUTINE W REFLEX MICROSCOPIC (NOT AT [facility]) - Abnormal; Notable for the following:    Hgb urine dipstick TRACE (*)    Protein, ur 30 (*)    All other components within normal limits  URINE RAPID DRUG SCREEN, HOSP PERFORMED - Abnormal; Notable for the following:    Tetrahydrocannabinol POSITIVE (*)    All other components within normal limits  POCT I-STAT 3, ART BLOOD GAS (G3+) - Abnormal; Notable for the following:    pO2, Arterial 243.0 (*)    Bicarbonate 24.9 (*)    All other components within normal limits  POCT I-STAT 3, ART BLOOD GAS (G3+) - Abnormal; Notable for the following:    pO2, Arterial 135.0 (*)    Bicarbonate 24.6 (*)    All other components  within normal limits  CULTURE, BLOOD (ROUTINE X 2)  CULTURE, BLOOD (ROUTINE X 2)  URINE CULTURE  MRSA PCR SCREENING  C DIFFICILE QUICK SCREEN W PCR REFLEX  ETHANOL  TRIGLYCERIDES  URINE MICROSCOPIC-ADD ON  CBC  BASIC METABOLIC PANEL  MAGNESIUM  PHOSPHORUS  BLOOD GAS, ARTERIAL    Imaging Review Dg Chest Port 1 View  11/03/2015  CLINICAL DATA:  Intubation. EXAM: PORTABLE CHEST 1 VIEW COMPARISON:  11/02/2015 . FINDINGS: Endotracheal tube and NG tube in stable position. Heart size normal. No focal infiltrate. No pleural effusion or pneumothorax. IMPRESSION: Lines and tubes in stable position. No acute cardiopulmonary disease. Electronically Signed   By: Marcello Moores  Register   On: 11/03/2015 07:13   Dg Chest Portable 1 View  11/02/2015  CLINICAL DATA:  Patient status post intubation.  Multiple seizures. EXAM: PORTABLE CHEST 1 VIEW COMPARISON:  Chest radiograph 11/02/2015 FINDINGS: ET tube terminates in the mid trachea. Normal cardiac and mediastinal contours. No consolidative pulmonary opacities. No pleural effusion or pneumothorax. Regional skeleton is unremarkable. IMPRESSION: ETT terminates in the mid trachea. No acute cardiopulmonary process. Electronically Signed   By: Lovey Newcomer M.D.   On: 11/02/2015 14:45   Dg Chest Portable 1 View  11/02/2015  CLINICAL DATA:  Multiple seizures for 2 days; pt combative and unable to follow commands EXAM: PORTABLE CHEST 1 VIEW COMPARISON:  10/10/2015 FINDINGS: The heart size and mediastinal contours are within normal limits. Both lungs are clear. The visualized skeletal structures are unremarkable. IMPRESSION: No active disease. Electronically Signed   By: Nolon Nations M.D.   On: 11/02/2015 14:26   I have personally reviewed and evaluated these images and lab results as part of my medical decision-making.  Neurology was consult and in a patient was evaluated by neurologist.  Critical care consult and patient admitted to critical care.  MDM   Final  diagnoses:  History of ETT        Leonard Schwartz, MD 11/03/15 2338

## 2015-11-03 NOTE — Progress Notes (Signed)
PULMONARY / CRITICAL CARE MEDICINE   Name: Lindsey [name] MRN: [id number] DOB: [birth year]    ADMISSION DATE:  11/02/2015 CONSULTATION DATE:  11/02/15   REFERRING MD:  EDP   CHIEF COMPLAINT:   Status Epilepticus   HISTORY OF PRESENT ILLNESS:   Lindsey [name] is a 60 y.o.female reported seizures per EMS, She had recent admission for seizures .  She has a history of EtOH abuse and is 0 today. She was combative and post-ictal on arrival to ER .  She then had another seizure after arrival to ER.  She repeatedly has presented in status with fever associated with it and has had negative LPs in past. Seen by Neuro started on Propofol and Keppra. She was intubated for airway protection. Tox screen in past for THC .  PCCM consulted.   SUBJECTIVE: No events overnight, heavily sedate.  VITAL SIGNS: BP 123/77 mmHg  Pulse 79  Temp(Src) 98 F (36.7 C) (Axillary)  Resp 16  Ht 5\' 2"  (1.575 m)  Wt 58.968 kg (130 lb)  BMI 23.77 kg/m2  SpO2 100%  HEMODYNAMICS:    VENTILATOR SETTINGS: Vent Mode:  [-] PRVC FiO2 (%):  [30 %-60 %] 30 % Set Rate:  [16 bmp] 16 bmp Vt Set:  [400 mL-500 mL] 400 mL PEEP:  [5 cmH20] 5 cmH20 Plateau Pressure:  [15 cmH20-18 cmH20] 16 cmH20  INTAKE / OUTPUT: I/O last 3 completed shifts: In: 1385.1 [I.V.:1385.1] Out: 61 [Urine:1050]  PHYSICAL EXAMINATION: General:  60 year old chronically ill appearing female, heavily sedated, not withdrawing to pain.  Neuro:  Heavily sedate, not withdrawing to pain. HEENT: [place]/AT, PERRL, EOM-I and MMM. Cardiovascular:  RRR, Nl S1/S2, -M/R/G. Lungs: Coarse BS diffusely. Abdomen:  Soft, NT, ND and +BS. Musculoskeletal: -edema and -tenderness. Skin:  Intact   LABS:  BMET  Recent Labs Lab 11/02/15 1340  NA 140  K 4.4  CL 102  CO2 24  BUN 14  CREATININE 0.84  GLUCOSE 91   Electrolytes  Recent Labs Lab 11/02/15 1340  CALCIUM 9.6   CBC  Recent Labs Lab 11/02/15 1340  WBC 12.2*  HGB 15.1*  HCT 43.8  PLT 268    Coag's No results for input(s): APTT, INR in the last 168 hours.  Sepsis Markers  Recent Labs Lab 11/02/15 1340  LATICACIDVEN 2.7*   ABG  Recent Labs Lab 11/02/15 2108 11/03/15 0321  PHART 7.399 7.418  PCO2ART 41.0 37.8  PO2ART 243.0* 135.0*   Liver Enzymes  Recent Labs Lab 11/02/15 1340  AST 20  ALT 11*  ALKPHOS 88  BILITOT 1.2  ALBUMIN 4.2    Cardiac Enzymes No results for input(s): TROPONINI, PROBNP in the last 168 hours.  Glucose No results for input(s): GLUCAP in the last 168 hours.  Imaging Dg Chest Port 1 View  11/03/2015  CLINICAL DATA:  Intubation. EXAM: PORTABLE CHEST 1 VIEW COMPARISON:  11/02/2015 . FINDINGS: Endotracheal tube and NG tube in stable position. Heart size normal. No focal infiltrate. No pleural effusion or pneumothorax. IMPRESSION: Lines and tubes in stable position. No acute cardiopulmonary disease. Electronically Signed   By: Marcello Moores  Register   On: 11/03/2015 07:13   Dg Chest Portable 1 View  11/02/2015  CLINICAL DATA:  Patient status post intubation.  Multiple seizures. EXAM: PORTABLE CHEST 1 VIEW COMPARISON:  Chest radiograph 11/02/2015 FINDINGS: ET tube terminates in the mid trachea. Normal cardiac and mediastinal contours. No consolidative pulmonary opacities. No pleural effusion or pneumothorax. Regional skeleton is unremarkable. IMPRESSION: ETT terminates  in the mid trachea. No acute cardiopulmonary process. Electronically Signed   By: Lovey Newcomer M.D.   On: 11/02/2015 14:45   Dg Chest Portable 1 View  11/02/2015  CLINICAL DATA:  Multiple seizures for 2 days; pt combative and unable to follow commands EXAM: PORTABLE CHEST 1 VIEW COMPARISON:  10/10/2015 FINDINGS: The heart size and mediastinal contours are within normal limits. Both lungs are clear. The visualized skeletal structures are unremarkable. IMPRESSION: No active disease. Electronically Signed   By: Nolon Nations M.D.   On: 11/02/2015 14:26   STUDIES:  CT head 10/10/15 neg  for acute   CULTURES: BC x 2 2/5 >> UC 2/5 >>  ANTIBIOTICS:   SIGNIFICANT EVENTS:   LINES/TUBES: 2/5 ETT >>  DISCUSSION: 60 yo female with ETOH abuse presents with recurrent status epilepticus   ASSESSMENT / PLAN:  PULMONARY A: Acute Resp Failure secondary to status epilepticus   P:   Wake up to wean and extubate today. Titrate O2 for sat of 88-92%.  CARDIOVASCULAR A: HTN   P:  Hold Metoprolol - cocaine, with continuous use should probably not be discharged on beta blockers at all.  RENAL A:   No acute issues  P:   Monitor   GASTROINTESTINAL A:   NPO   P:   PPI. Start TF if patient is to remain intubated today.  HEMATOLOGIC A:   No acute issues   P:  Check cbc in am. Transfuse per ICU protocol.  INFECTIOUS A:  No apparent infection   P:   Tr wbc/temp   ENDOCRINE A:  No acute issues   P:   Monitor   NEUROLOGIC A:   Status Epilepticus   P:   Neuro following. Cont Keppra. D/C sedation to extubate today.  FAMILY  - Updates: No family bedside.  - Inter-disciplinary family meet or Palliative Care meeting due by: 2/12   The patient is critically ill with multiple organ systems failure and requires high complexity decision making for assessment and support, frequent evaluation and titration of therapies, application of advanced monitoring technologies and extensive interpretation of multiple databases.   Critical Care Time devoted to patient care services described in this note is  35  Minutes. This time reflects time of care of this signee Dr Jennet Maduro. This critical care time does not reflect procedure time, or teaching time or supervisory time of PA/NP/Med student/Med Resident etc but could involve care discussion time.  Rush Farmer, M.D. [facility] Pulmonary/Critical Care Medicine. Pager: [phone]. After hours pager: [phone].  11/03/2015, 8:39 AM

## 2015-11-03 NOTE — Progress Notes (Signed)
ABG collected  

## 2015-11-03 NOTE — Progress Notes (Signed)
Subjective: Continues to improve.   Off sedation, more responsive.   Exam: Filed Vitals:   11/03/15 0700 11/03/15 0734  BP: 132/80 123/77  Pulse: 80 79  Temp:    Resp: 16 16   Neuro: MS: Awake, alert, follows simple commands CN: EOMI, VFF Motor: Moves all extremity as well Sensory: Intact to light touch   Impression: 60 year old female with breakthrough seizures in the setting of medication noncompliance. With her improvement, my concern for CNS infection is low and would not pursue further testing unless she were to worsen again.   She   Recommendations: 1) continue VPA, levetiracetam 2) may benefit from social work to assist with medications 3) Neurology to sign off, please call with any further quesitons or concerns.     [name], DO [facility] [phone]  If 7pm- 7am, please page neurology on call as listed in [place].

## 2015-11-03 NOTE — Procedures (Signed)
Extubation Procedure Note  Patient Details:   Name: Lindsey [name] DOB: 09-05-1956 MRN: [id number]   Airway Documentation:   Pt. Extubated to 2l [place], sat 100%. Pt able to state first name. Pt had an audible cuff leak prior to extubation. PT stable, will continue to monitor.   Evaluation  O2 sats: stable throughout Complications: No apparent complications Patient did tolerate procedure well. Bilateral Breath Sounds: Clear, Diminished Suctioning: Oral, Airway Yes  Ned Grace 11/03/2015, 9:44 AM

## 2015-11-04 ENCOUNTER — Inpatient Hospital Stay (HOSPITAL_COMMUNITY): Payer: Medicaid Other

## 2015-11-04 DIAGNOSIS — F29 Unspecified psychosis not due to a substance or known physiological condition: Secondary | ICD-10-CM | POA: Diagnosis present

## 2015-11-04 DIAGNOSIS — R443 Hallucinations, unspecified: Secondary | ICD-10-CM

## 2015-11-04 LAB — CBC
HEMATOCRIT: 37.6 % (ref 36.0–46.0)
HEMOGLOBIN: 12.7 g/dL (ref 12.0–15.0)
MCH: 29.8 pg (ref 26.0–34.0)
MCHC: 33.8 g/dL (ref 30.0–36.0)
MCV: 88.3 fL (ref 78.0–100.0)
Platelets: 268 10*3/uL (ref 150–400)
RBC: 4.26 MIL/uL (ref 3.87–5.11)
RDW: 13.5 % (ref 11.5–15.5)
WBC: 13.3 10*3/uL — ABNORMAL HIGH (ref 4.0–10.5)

## 2015-11-04 LAB — BLOOD GAS, ARTERIAL
ACID-BASE DEFICIT: 0.6 mmol/L (ref 0.0–2.0)
Bicarbonate: 22.8 mEq/L (ref 20.0–24.0)
DRAWN BY: 44956
O2 Content: 4 L/min
O2 SAT: 95.7 %
PATIENT TEMPERATURE: 98.6
PO2 ART: 82 mmHg (ref 80.0–100.0)
TCO2: 23.8 mmol/L (ref 0–100)
pCO2 arterial: 32.8 mmHg — ABNORMAL LOW (ref 35.0–45.0)
pH, Arterial: 7.455 — ABNORMAL HIGH (ref 7.350–7.450)

## 2015-11-04 LAB — C DIFFICILE QUICK SCREEN W PCR REFLEX
C DIFFICLE (CDIFF) ANTIGEN: POSITIVE — AB
C Diff toxin: NEGATIVE

## 2015-11-04 LAB — BASIC METABOLIC PANEL
Anion gap: 11 (ref 5–15)
BUN: 8 mg/dL (ref 6–20)
CHLORIDE: 105 mmol/L (ref 101–111)
CO2: 24 mmol/L (ref 22–32)
CREATININE: 0.6 mg/dL (ref 0.44–1.00)
Calcium: 9 mg/dL (ref 8.9–10.3)
GFR calc Af Amer: >60 mL/min (ref >60)
GFR calc non Af Amer: >60 mL/min (ref >60)
GLUCOSE: 86 mg/dL (ref 65–99)
POTASSIUM: 3.2 mmol/L — AB (ref 3.5–5.1)
Sodium: 140 mmol/L (ref 135–145)

## 2015-11-04 LAB — PHOSPHORUS: Phosphorus: 3.4 mg/dL (ref 2.5–4.6)

## 2015-11-04 LAB — MAGNESIUM: MAGNESIUM: 1.7 mg/dL (ref 1.7–2.4)

## 2015-11-04 MED ORDER — VALPROIC ACID 250 MG PO CAPS
500.0000 mg | ORAL_CAPSULE | Freq: Two times a day (BID) | ORAL | Status: DC
  Administered 2015-11-04 – 2015-11-06 (×5): 500 mg via ORAL
  Filled 2015-11-04 (×5): qty 2

## 2015-11-04 MED ORDER — MAGNESIUM SULFATE 2 GM/50ML IV SOLN
INTRAVENOUS | Status: AC
  Administered 2015-11-04: 2 g
  Filled 2015-11-04: qty 50

## 2015-11-04 MED ORDER — MIDAZOLAM HCL 2 MG/2ML IJ SOLN
1.0000 mg | INTRAMUSCULAR | Status: DC | PRN
Start: 2015-11-04 — End: 2015-11-04
  Administered 2015-11-04: 2 mg via INTRAVENOUS
  Filled 2015-11-04 (×3): qty 2

## 2015-11-04 MED ORDER — LORAZEPAM 1 MG PO TABS: 0.0000 mg | ORAL_TABLET | Freq: Two times a day (BID) | ORAL | Status: DC

## 2015-11-04 MED ORDER — FIDAXOMICIN 200 MG PO TABS
200.0000 mg | ORAL_TABLET | Freq: Two times a day (BID) | ORAL | Status: DC
  Administered 2015-11-04 – 2015-11-06 (×6): 200 mg via ORAL
  Filled 2015-11-04 (×7): qty 1

## 2015-11-04 MED ORDER — FOLIC ACID 1 MG PO TABS
1.0000 mg | ORAL_TABLET | Freq: Every day | ORAL | Status: DC
  Administered 2015-11-04: 1 mg via ORAL
  Filled 2015-11-04: qty 1

## 2015-11-04 MED ORDER — ADULT MULTIVITAMIN W/MINERALS CH
1.0000 | ORAL_TABLET | Freq: Every day | ORAL | Status: DC
  Administered 2015-11-04 – 2015-11-06 (×3): 1 via ORAL
  Filled 2015-11-04 (×3): qty 1

## 2015-11-04 MED ORDER — VITAMIN B-1 100 MG PO TABS
100.0000 mg | ORAL_TABLET | Freq: Every day | ORAL | Status: DC
  Administered 2015-11-04 – 2015-11-06 (×3): 100 mg via ORAL
  Filled 2015-11-04 (×3): qty 1

## 2015-11-04 MED ORDER — THIAMINE HCL 100 MG/ML IJ SOLN: 100.0000 mg | Freq: Every day | INTRAMUSCULAR | Status: DC

## 2015-11-04 MED ORDER — LORAZEPAM 1 MG PO TABS
0.0000 mg | ORAL_TABLET | Freq: Four times a day (QID) | ORAL | Status: AC
  Administered 2015-11-04 (×2): 4 mg via ORAL
  Filled 2015-11-04 (×2): qty 4

## 2015-11-04 MED ORDER — MIDAZOLAM HCL 2 MG/2ML IJ SOLN
INTRAMUSCULAR | Status: AC
  Administered 2015-11-04: 2 mg
  Filled 2015-11-04: qty 2

## 2015-11-04 MED ORDER — MAGNESIUM SULFATE 2 GM/50ML IV SOLN: 2.0000 g | Freq: Once | INTRAVENOUS | Status: DC

## 2015-11-04 MED ORDER — POTASSIUM CHLORIDE CRYS ER 10 MEQ PO TBCR
EXTENDED_RELEASE_TABLET | ORAL | Status: AC
  Administered 2015-11-04: 30 meq
  Filled 2015-11-04: qty 3

## 2015-11-04 MED ORDER — POTASSIUM CHLORIDE CRYS ER 20 MEQ PO TBCR
30.0000 meq | EXTENDED_RELEASE_TABLET | ORAL | Status: AC
  Administered 2015-11-04: 30 meq via ORAL
  Filled 2015-11-04: qty 1

## 2015-11-04 MED ORDER — VITAMIN B-1 100 MG PO TABS: 100.0000 mg | ORAL_TABLET | Freq: Every day | ORAL | Status: DC

## 2015-11-04 MED ORDER — ADULT MULTIVITAMIN W/MINERALS CH: 1.0000 | ORAL_TABLET | Freq: Every day | ORAL | Status: DC

## 2015-11-04 MED ORDER — RISPERIDONE 0.5 MG PO TABS
0.5000 mg | ORAL_TABLET | Freq: Two times a day (BID) | ORAL | Status: DC
  Administered 2015-11-04 – 2015-11-06 (×4): 0.5 mg via ORAL
  Filled 2015-11-04 (×4): qty 1

## 2015-11-04 MED ORDER — FOLIC ACID 1 MG PO TABS
1.0000 mg | ORAL_TABLET | Freq: Every day | ORAL | Status: DC
  Administered 2015-11-05 – 2015-11-06 (×2): 1 mg via ORAL
  Filled 2015-11-04 (×2): qty 1

## 2015-11-04 NOTE — Progress Notes (Signed)
[place] Progress Note Patient Name: Lindsey [name] DOB: 1956/05/30 MRN: [id number]   Date of Service  11/04/2015  HPI/Events of Note  Delirium felt to be d/t ETOH withdrawal. Patient is already on Thiamine   eICU Interventions  Will start on CIWA protocol with Versed.      Intervention Category Major Interventions: Delirium, psychosis, severe agitation - evaluation and management  Sommer,Steven [name] 11/04/2015, 12:35 AM

## 2015-11-04 NOTE — Progress Notes (Signed)
Pt admitted to 5M14; Alert and oriented; denies pain at present.  Pt verbalizes understanding of calling for staff before attempting to get out of bed.  Call bell within reach and bed alarm set.  Will continue to monitor and assess.

## 2015-11-04 NOTE — Progress Notes (Signed)
[place] Progress Note Patient Name: Lindsey [name] DOB: [birth year] MRN: [id number]   Date of Service  11/04/2015  HPI/Events of Note  C. Difficile toxin negative, however, C. Difficile Antigen is positive. This is c/w colonization. Patient has had 4 liquid BM's today.  Low grade fever today to 100.4 F and WBC = 12.2 yesterday morning. Patient c/o +/- abdominal pain. Hx of recent C. Difficile infection. Patient is already on enteric precautions.   eICU Interventions  Will order Fidaxomicin 200 mg PO now and BID.      Intervention Category Intermediate Interventions: Other:  Sommer,Steven Cornelia Copa 11/04/2015, 3:30 AM

## 2015-11-04 NOTE — Progress Notes (Signed)
[facility] ADULT ICU REPLACEMENT PROTOCOL FOR AM LAB REPLACEMENT ONLY  The patient does apply for the [facility] Adult ICU Electrolyte Replacment Protocol based on the criteria listed below:   1. Is GFR >/= 40 ml/min? Yes.    Patient's GFR today is >60 2. Is urine output >/= 0.5 ml/kg/hr for the last 6 hours? Yes.   Patient's UOP is 1.35 ml/kg/hr 3. Is BUN < 60 mg/dL? Yes.    Patient's BUN today is 8 4. Abnormal electrolyte(s): Potassium, 3.2, Magnesium, 1.7 5. Ordered repletion with: [facility] adult ICU replacement protocol 6. If a panic level lab has been reported, has the CCM MD in charge been notified? Yes.  .   Physician:  Dr. Boone Master  [facility], Darrick Huntsman E 11/04/2015 6:41 AM
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
## 2015-11-04 NOTE — Progress Notes (Signed)
PULMONARY / CRITICAL CARE MEDICINE   Name: Lindsey [name] MRN: [id number] DOB: December 20, 1955    ADMISSION DATE:  11/02/2015 CONSULTATION DATE:  11/02/15   REFERRING MD:  EDP   CHIEF COMPLAINT:   Status Epilepticus   HISTORY OF PRESENT ILLNESS:   Lindsey [name] is a 60 y.o.female reported seizures per EMS, She had recent admission for seizures .  She has a history of EtOH abuse and is 0 today. She was combative and post-ictal on arrival to ER .  She then had another seizure after arrival to ER.  She repeatedly has presented in status with fever associated with it and has had negative LPs in past. Seen by Neuro started on Propofol and Keppra. She was intubated for airway protection. Tox screen in past for THC .  PCCM consulted.   SUBJECTIVE: No events overnight, very psychotic this AM.  VITAL SIGNS: BP 188/99 mmHg  Pulse 30  Temp(Src) 97.6 F (36.4 C) (Oral)  Resp 20  Ht 5\' 2"  (1.575 m)  Wt 58.968 kg (130 lb)  BMI 23.77 kg/m2  SpO2 92%  HEMODYNAMICS:    VENTILATOR SETTINGS:    INTAKE / OUTPUT: I/O last 3 completed shifts: In: 3871 [P.O.:480; I.V.:3231; IV Piggyback:160] Out: 2940 [Urine:2940]  PHYSICAL EXAMINATION: General:  60 year old chronically ill appearing female, awake and very confused this AM. Neuro:  Alert and oriented, hallucinating, moving all ext to command. HEENT: [place]/AT, PERRL, EOM-I and MMM. Cardiovascular:  RRR, Nl S1/S2, -M/R/G. Lungs: CTA bilaterally. Abdomen:  Soft, NT, ND and +BS. Musculoskeletal: -edema and -tenderness. Skin:  Intact   LABS:  BMET  Recent Labs Lab 11/02/15 1340 11/04/15 0421  NA 140 140  K 4.4 3.2*  CL 102 105  CO2 24 24  BUN 14 8  CREATININE 0.84 0.60  GLUCOSE 91 86   Electrolytes  Recent Labs Lab 11/02/15 1340 11/04/15 0421  CALCIUM 9.6 9.0  MG  --  1.7  PHOS  --  3.4   CBC  Recent Labs Lab 11/02/15 1340 11/04/15 0421  WBC 12.2* 13.3*  HGB 15.1* 12.7  HCT 43.8 37.6  PLT 268 268   Coag's No results  for input(s): APTT, INR in the last 168 hours.  Sepsis Markers  Recent Labs Lab 11/02/15 1340  LATICACIDVEN 2.7*   ABG  Recent Labs Lab 11/02/15 2108 11/03/15 0321 11/04/15 0509  PHART 7.399 7.418 7.455*  PCO2ART 41.0 37.8 32.8*  PO2ART 243.0* 135.0* 82.0   Liver Enzymes  Recent Labs Lab 11/02/15 1340  AST 20  ALT 11*  ALKPHOS 88  BILITOT 1.2  ALBUMIN 4.2    Cardiac Enzymes No results for input(s): TROPONINI, PROBNP in the last 168 hours.  Glucose No results for input(s): GLUCAP in the last 168 hours.  Imaging Dg Chest Port 1 View  11/04/2015  CLINICAL DATA:  Interval extubation of the trachea. Acute respiratory failure, seizure activity. EXAM: PORTABLE CHEST 1 VIEW COMPARISON:  Chest x-ray of November 03, 2015 FINDINGS: The lungs are well-expanded postextubation. There is no focal infiltrate. There is no pleural effusion. The heart and pulmonary vascularity are normal. The mediastinum is normal in width. There is mild tortuosity of the descending thoracic aorta. There is degenerative disc disease at multiple thoracic levels. IMPRESSION: COPD-reactive airway disease. There is no pneumonia nor CHF nor other acute cardiopulmonary abnormality. Electronically Signed   By: David  Martinique M.D.   On: 11/04/2015 07:24   STUDIES:  CT head 10/10/15 neg for acute   CULTURES:  BC x 2 2/5 >> UC 2/5 >>  ANTIBIOTICS:   SIGNIFICANT EVENTS:   LINES/TUBES: 2/5 ETT >>2/6  I reviewed CXR myself, no acute disease noted.  DISCUSSION: 60 yo female with ETOH abuse presents with recurrent status epilepticus   ASSESSMENT / PLAN:  PULMONARY A: Acute Resp Failure secondary to status epilepticus   P:   Titrate O2 for sat of 88-92%. IS per RT protocol.  CARDIOVASCULAR A: HTN   P:  Hold Metoprolol - cocaine, with continuous use should probably not be discharged on beta blockers at all.  RENAL A:   No acute issues  P:   Replace electrolytes as indicated. BMET in  AM. D/C IVF.  GASTROINTESTINAL A:   Nothing acute. P:   PPI. Heart healthy diet.  HEMATOLOGIC A:   No acute issues   P:  Check cbc in am. Transfuse per ICU protocol.  INFECTIOUS A:  No apparent infection   P:   Tr wbc/temp   ENDOCRINE A:  No acute issues   P:   Monitor   NEUROLOGIC A:   Status Epilepticus  Hallucinating. P:   Neuro following. Change valproic acid to PO. Psych consult called.  FAMILY  - Updates: No family bedside.  - Inter-disciplinary family meet or Palliative Care meeting due by: 2/12   Transfer to SDU and to [facility] service with PCCM off 2/8.  Psych consult called.  Rush Farmer, M.D. [facility] Pulmonary/Critical Care Medicine. Pager: [phone]. After hours pager: [phone].  11/04/2015, 10:47 AM

## 2015-11-05 DIAGNOSIS — F259 Schizoaffective disorder, unspecified: Secondary | ICD-10-CM

## 2015-11-05 DIAGNOSIS — G40909 Epilepsy, unspecified, not intractable, without status epilepticus: Secondary | ICD-10-CM

## 2015-11-05 LAB — BASIC METABOLIC PANEL
Anion gap: 10 (ref 5–15)
BUN: 7 mg/dL (ref 6–20)
CHLORIDE: 108 mmol/L (ref 101–111)
CO2: 24 mmol/L (ref 22–32)
CREATININE: 0.7 mg/dL (ref 0.44–1.00)
Calcium: 9.2 mg/dL (ref 8.9–10.3)
GFR calc non Af Amer: >60 mL/min (ref >60)
GLUCOSE: 85 mg/dL (ref 65–99)
Potassium: 3.9 mmol/L (ref 3.5–5.1)
Sodium: 142 mmol/L (ref 135–145)

## 2015-11-05 LAB — CBC
HEMATOCRIT: 35.7 % — AB (ref 36.0–46.0)
Hemoglobin: 12 g/dL (ref 12.0–15.0)
MCH: 29.8 pg (ref 26.0–34.0)
MCHC: 33.6 g/dL (ref 30.0–36.0)
MCV: 88.6 fL (ref 78.0–100.0)
Platelets: 232 10*3/uL (ref 150–400)
RBC: 4.03 MIL/uL (ref 3.87–5.11)
RDW: 13.6 % (ref 11.5–15.5)
WBC: 7.8 10*3/uL (ref 4.0–10.5)

## 2015-11-05 LAB — MAGNESIUM: Magnesium: 1.6 mg/dL — ABNORMAL LOW (ref 1.7–2.4)

## 2015-11-05 LAB — PHOSPHORUS: Phosphorus: 4.4 mg/dL (ref 2.5–4.6)

## 2015-11-05 MED ORDER — ZOLPIDEM TARTRATE 5 MG PO TABS
5.0000 mg | ORAL_TABLET | Freq: Every evening | ORAL | Status: DC | PRN
  Administered 2015-11-05: 5 mg via ORAL
  Filled 2015-11-05: qty 1

## 2015-11-05 NOTE — Progress Notes (Signed)
PROGRESS NOTE  Lindsey [name] [id number] DOB: 1955-12-25 DOA: 11/02/2015 PCP: PROVIDER NOT IN SYSTEM  HPI/Recap of past 24 hours: Patient is a 60 year old female past medical history of schizo effective disorder, alcohol abuse and seizures who presented to the emergency room post ictal and required propofol and Keppra for seizure stabilization. Patient initially intubated for airway protection and then self extubated by the following day. Since that time she has been combative and agitated. Seen by psychiatry who felt she was in acute psychosis.  Following respiratory stabilization, patient transferred to floor and hospitalists assumed care on 2/8  Seen by psychiatry and started on Risperdal. Initially felt to be acutely psychotic, patient has improved and psychiatry as of 2/8 feel that patient does not meet criteria for inpatient psychiatry.  Patient seen today with no complaints other than feeling weak. States that she wants to get better so that she can go home soon. Rather alert and oriented and interactive appropriately.  Assessment/Plan: Principal Problem:   Psychosis: Improving. Continue Risperdal. Not candidate for inpatient psychiatric admission Active Problems:   Status epilepticus (HCC)colon with underlying seizure disorder: Now on Depakote.   Acute respiratory failure with hypoxia [facility]): Stabilized   Essential hypertension: Blood pressure stable.   Code Status: full code   Family Communication: left message with daughter  Disposition Plan: possible discharge tomorrow    Consultants:  Psychiatry  Critical care  Procedures:  Intubation 2/5-2/6   Antibiotics:  none   Objective: BP 141/70 mmHg  Pulse 88  Temp(Src) 98.4 F (36.9 C) (Oral)  Resp 17  Ht 5\' 2"  (1.575 m)  Wt 58.968 kg (130 lb)  BMI 23.77 kg/m2  SpO2 98%  Intake/Output Summary (Last 24 hours) at 11/05/15 1540 Last data filed at 11/04/15 1800  Gross per 24 hour  Intake    200 ml  Output       0 ml  Net    200 ml   Filed Weights   11/02/15 1308  Weight: 58.968 kg (130 lb)    Exam:   General:  Oriented 2, no acute distress   Cardiovascular: regular rate and rhythm, S1-S2   Respiratory: clear to auscultation bilaterally   Abdomen: soft, nontender, nondistended, positive bowel sounds   Musculoskeletal: no clubbing or cyanosis or edema    Data Reviewed: Basic Metabolic Panel:  Recent Labs Lab 11/02/15 1340 11/04/15 0421 11/05/15 0551  NA 140 140 142  K 4.4 3.2* 3.9  CL 102 105 108  CO2 24 24 24   GLUCOSE 91 86 85  BUN 14 8 7   CREATININE 0.84 0.60 0.70  CALCIUM 9.6 9.0 9.2  MG  --  1.7 1.6*  PHOS  --  3.4 4.4   Liver Function Tests:  Recent Labs Lab 11/02/15 1340  AST 20  ALT 11*  ALKPHOS 88  BILITOT 1.2  PROT 7.8  ALBUMIN 4.2   No results for input(s): LIPASE, AMYLASE in the last 168 hours. No results for input(s): AMMONIA in the last 168 hours. CBC:  Recent Labs Lab 11/02/15 1340 11/04/15 0421 11/05/15 0551  WBC 12.2* 13.3* 7.8  NEUTROABS 9.4*  --   --   HGB 15.1* 12.7 12.0  HCT 43.8 37.6 35.7*  MCV 90.1 88.3 88.6  PLT 268 268 232   Cardiac Enzymes:   No results for input(s): CKTOTAL, CKMB, CKMBINDEX, TROPONINI in the last 168 hours. BNP (last 3 results) No results for input(s): BNP in the last 8760 hours.  ProBNP (last 3 results) No  results for input(s): PROBNP in the last 8760 hours.  CBG: No results for input(s): GLUCAP in the last 168 hours.  Recent Results (from the past 240 hour(s))  Culture, blood (Routine X 2) w Reflex to ID Panel     Status: None (Preliminary result)   Collection Time: 11/02/15  1:20 PM  Result Value Ref Range Status   Specimen Description BLOOD RIGHT HAND  Final   Special Requests BOTTLES DRAWN AEROBIC ONLY 7MLS  Final   Culture NO GROWTH 3 DAYS  Final   Report Status PENDING  Incomplete  Culture, blood (Routine X 2) w Reflex to ID Panel     Status: None (Preliminary result)   Collection  Time: 11/02/15  1:40 PM  Result Value Ref Range Status   Specimen Description BLOOD LEFT HAND  Final   Special Requests IN PEDIATRIC BOTTLE 3MLS  Final   Culture NO GROWTH 3 DAYS  Final   Report Status PENDING  Incomplete  Urine culture     Status: None   Collection Time: 11/02/15  4:00 PM  Result Value Ref Range Status   Specimen Description URINE, CLEAN CATCH  Final   Special Requests NONE  Final   Culture NO GROWTH 1 DAY  Final   Report Status 11/03/2015 FINAL  Final  MRSA PCR Screening     Status: None   Collection Time: 11/02/15  7:09 PM  Result Value Ref Range Status   MRSA by PCR NEGATIVE NEGATIVE Final    Comment:        The GeneXpert MRSA Assay (FDA approved for NASAL specimens only), is one component of a comprehensive MRSA colonization surveillance program. It is not intended to diagnose MRSA infection nor to guide or monitor treatment for MRSA infections.   C difficile quick scan w PCR reflex     Status: Abnormal   Collection Time: 11/03/15  8:35 PM  Result Value Ref Range Status   C Diff antigen POSITIVE (A) NEGATIVE Final   C Diff toxin NEGATIVE NEGATIVE Final   C Diff interpretation   Final    C. difficile present, but toxin not detected. This indicates colonization. In most cases, this does not require treatment. If patient has signs and symptoms consistent with colitis, consider treatment. Requires ENTERIC precautions.     Studies: No results found.  Scheduled Meds: . fidaxomicin  200 mg Oral BID  . folic acid  1 mg Oral Daily  . LORazepam  0-4 mg Oral Q6H   Followed by  . [START ON 11/06/2015] LORazepam  0-4 mg Oral Q12H  . magnesium sulfate 1 - 4 g bolus IVPB  2 g Intravenous Once  . multivitamin with minerals  1 tablet Oral Daily  . risperiDONE  0.5 mg Oral BID  . thiamine  100 mg Oral Daily  . valproic acid  500 mg Oral BID    Continuous Infusions: . sodium chloride Stopped (11/04/15 0745)     Time spent: 15 minutes   [facility] Pager [phone] . If 7PM-7AM, please contact night-coverage at [email or web address], password [facility] 11/05/2015, 3:40 PM  LOS: 3 days

## 2015-11-05 NOTE — Care Management Note (Signed)
Case Management Note  Patient Details  Name: Lindsey [name] MRN: [id number] Date of Birth: May 11, 1956  Subjective/Objective:                    Action/Plan: Patient was admitted with seizures, psychosis.  Per psychiatry, patient is a candidate for inpatient psychiatric treatment. Psych CSW was notified of patient.  Will follow for discharge needs.  Expected Discharge Date:                  Expected Discharge Plan:  [facility]  In-House Referral:  Clinical Social Work  Discharge planning Services     Post Acute Care Choice:    Choice offered to:     DME Arranged:    DME Agency:     HH Arranged:    [place] Agency:     Status of Service:  In process, will continue to follow  Medicare Important Message Given:    Date Medicare IM Given:    Medicare IM give by:    Date Additional Medicare IM Given:    Additional Medicare Important Message give by:     If discussed at [place] of Stay Meetings, dates discussed:    Additional CommentsRolm Baptise, RN 11/05/2015, 1:12 PM [phone]
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
## 2015-11-06 LAB — VALPROIC ACID LEVEL: Valproic Acid Lvl: 47 ug/mL — ABNORMAL LOW (ref 50.0–100.0)

## 2015-11-06 MED ORDER — LEVETIRACETAM 500 MG PO TABS
500.0000 mg | ORAL_TABLET | Freq: Two times a day (BID) | ORAL | Status: DC
  Administered 2015-11-06: 500 mg via ORAL
  Filled 2015-11-06: qty 1

## 2015-11-06 MED ORDER — RISPERIDONE 0.5 MG PO TABS: 0.5000 mg | ORAL_TABLET | Freq: Two times a day (BID) | ORAL | Status: DC

## 2015-11-06 NOTE — Progress Notes (Signed)
Patient is discharged from room 5M14 at this time. Alert and in stable condition. IV site d/c'd as well as tele. Instructions read to patient with understanding verbalized. Cab voucher received from patient. Transported out of unit via wheelchair with all belongings at side.

## 2015-11-06 NOTE — Discharge Summary (Signed)
Discharge Summary  Lindsey [name] [id number] DOB: May 02, 1956  PCP: PROVIDER NOT IN SYSTEM  Admit date: 11/02/2015 Discharge date: 11/06/2015  Time spent: 25 minutes  Recommendations for Outpatient Follow-up:  1. New medication: Risperdal 0.5 mg by mouth twice a day 2. Patient will follow up with outpatient psychiatry 3. Patient will resume her previous medications of Keppra and Depakote which she had not been taking   Discharge Diagnoses:  Active Hospital Problems   Diagnosis Date Noted  . Psychosis 11/04/2015  . Acute respiratory failure with hypoxia ([place]) 11/03/2015  . Essential hypertension 11/03/2015  . Status epilepticus ([place]) 08/15/2015    Resolved Hospital Problems   Diagnosis Date Noted Date Resolved  No resolved problems to display.    Discharge Condition: Improved, being discharged home   Diet recommendation: Low-sodium  Filed Weights   11/02/15 1308  Weight: 58.968 kg (130 lb)    History of present illness:  Patient is a 60 year old female past medical history of schizo effective disorder, alcohol abuse and seizures who presented to the emergency room post ictal and required propofol and Keppra for seizure stabilization.  Hospital Course:  Principal Problem:   Psychosis: Seen by psychiatry and started on Risperdal. Initially felt to be acutely psychotic, patient has improved and psychiatry as of 2/8 feel that patient does not meet criteria for inpatient psychiatry. Patient discharged on by mouth Risperdal 0.5 twice a day Active Problems:   Status epilepticus [facility]): Patient has since been stabilized on Depakote and Keppra, her normal home medications which she had not been taking. These medications will be continued on discharge   Acute respiratory failure with hypoxia [facility]): Patient initially intubated for airway protection and then self extubated by the following day. Since that time she has been combative and agitated. Seen by psychiatry who felt she was in  acute psychosis. Following respiratory stabilization, patient transferred to floor and hospitalists assumed care on 2/8   Essential hypertension    Consultants:  Psychiatry  Critical care  Procedures:  Intubation 2/5-2/6  Discharge Exam: BP 125/88 mmHg  Pulse 88  Temp(Src) 98.3 F (36.8 C) (Oral)  Resp 17  Ht 5\' 2"  (1.575 m)  Wt 58.968 kg (130 lb)  BMI 23.77 kg/m2  SpO2 98%  General: Alert and oriented 3, no acute distress  Cardiovascular: Regular rate and rhythm, S1-S2  Respiratory: Clear to auscultation bilaterally   Discharge Instructions You were cared for by a hospitalist during your hospital stay. If you have any questions about your discharge medications or the care you received while you were in the hospital after you are discharged, you can call the unit and asked to speak with the hospitalist on call if the hospitalist that took care of you is not available. Once you are discharged, your primary care physician will handle any further medical issues. Please note that NO REFILLS for any discharge medications will be authorized once you are discharged, as it is imperative that you return to your primary care physician (or establish a relationship with a primary care physician if you do not have one) for your aftercare needs so that they can reassess your need for medications and monitor your lab values.  Discharge Instructions    Diet - low sodium heart healthy    Complete by:  As directed      Increase activity slowly    Complete by:  As directed             Medication List    STOP  taking these medications        acetaminophen-codeine 300-30 MG tablet  Commonly known as:  TYLENOL #3      TAKE these medications        divalproex 500 MG DR tablet  Commonly known as:  DEPAKOTE  Take 1 tablet (500 mg total) by mouth every 12 (twelve) hours.     donepezil 10 MG tablet  Commonly known as:  ARICEPT  Take 10 mg by mouth daily.     hydrOXYzine 25 MG  capsule  Commonly known as:  VISTARIL  Take 25 mg by mouth 3 (three) times daily as needed for anxiety.     levETIRAcetam 750 MG tablet  Commonly known as:  KEPPRA  Take 1 tablet (750 mg total) by mouth 2 (two) times daily.     magnesium oxide 400 MG tablet  Commonly known as:  MAG-OX  Take 400 mg by mouth 3 (three) times daily.     pantoprazole 40 MG tablet  Commonly known as:  PROTONIX  Take 1 tablet (40 mg total) by mouth 2 (two) times daily.     PROAIR HFA 108 (90 Base) MCG/ACT inhaler  Generic drug:  albuterol  Inhale 2 puffs into the lungs every 6 (six) hours as needed for wheezing or shortness of breath.     risperiDONE 0.5 MG tablet  Commonly known as:  RISPERDAL  Take 1 tablet (0.5 mg total) by mouth 2 (two) times daily.     tiotropium 18 MCG inhalation capsule  Commonly known as:  SPIRIVA  Place 18 mcg into inhaler and inhale daily.       Allergies  Allergen Reactions  . Kiwi Extract Hives and Itching  . Morphine And Related Hives  . Penicillins Hives and Itching    Has patient had a PCN reaction causing immediate rash, facial/tongue/throat swelling, SOB or lightheadedness with hypotension: Yes Has patient had a PCN reaction causing severe rash involving mucus membranes or skin necrosis: No Has patient had a PCN reaction that required hospitalization No Has patient had a PCN reaction occurring within the last 10 years: No If all of the above answers are "NO", then may proceed with Cephalosporin use.  . Strawberry Extract Hives and Itching      The results of significant diagnostics from this hospitalization (including imaging, microbiology, ancillary and laboratory) are listed below for reference.    Significant Diagnostic Studies: Ct Head Wo Contrast  10/10/2015  CLINICAL DATA:  Multiple seizures today. Patient is combative leading to motion on the images. EXAM: CT HEAD WITHOUT CONTRAST TECHNIQUE: Contiguous axial images were obtained from the base of the  skull through the vertex without intravenous contrast. COMPARISON:  08/15/2015 FINDINGS: Ventricles are normal in size and configuration. There is mild sulcal enlargement stable from the prior CT most evident involving the cerebellum. There are no parenchymal masses or mass effect, no evidence of a cortical infarct, no extra-axial masses or abnormal fluid collections and no intracranial hemorrhage. Visualized sinuses and mastoid air cells are clear. No change from the prior head CT. IMPRESSION: 1. No acute intracranial abnormalities. No change from the prior head CT. Electronically Signed   By: Lajean Manes M.D.   On: 10/10/2015 15:10   Dg Chest Port 1 View  11/04/2015  CLINICAL DATA:  Interval extubation of the trachea. Acute respiratory failure, seizure activity. EXAM: PORTABLE CHEST 1 VIEW COMPARISON:  Chest x-ray of November 03, 2015 FINDINGS: The lungs are well-expanded postextubation. There is no focal infiltrate.  There is no pleural effusion. The heart and pulmonary vascularity are normal. The mediastinum is normal in width. There is mild tortuosity of the descending thoracic aorta. There is degenerative disc disease at multiple thoracic levels. IMPRESSION: COPD-reactive airway disease. There is no pneumonia nor CHF nor other acute cardiopulmonary abnormality. Electronically Signed   By: David  Martinique M.D.   On: 11/04/2015 07:24   Dg Chest Port 1 View  11/03/2015  CLINICAL DATA:  Intubation. EXAM: PORTABLE CHEST 1 VIEW COMPARISON:  11/02/2015 . FINDINGS: Endotracheal tube and NG tube in stable position. Heart size normal. No focal infiltrate. No pleural effusion or pneumothorax. IMPRESSION: Lines and tubes in stable position. No acute cardiopulmonary disease. Electronically Signed   By: Marcello Moores  Register   On: 11/03/2015 07:13   Dg Chest Portable 1 View  11/02/2015  CLINICAL DATA:  Patient status post intubation.  Multiple seizures. EXAM: PORTABLE CHEST 1 VIEW COMPARISON:  Chest radiograph 11/02/2015  FINDINGS: ET tube terminates in the mid trachea. Normal cardiac and mediastinal contours. No consolidative pulmonary opacities. No pleural effusion or pneumothorax. Regional skeleton is unremarkable. IMPRESSION: ETT terminates in the mid trachea. No acute cardiopulmonary process. Electronically Signed   By: Lovey Newcomer M.D.   On: 11/02/2015 14:45   Dg Chest Portable 1 View  11/02/2015  CLINICAL DATA:  Multiple seizures for 2 days; pt combative and unable to follow commands EXAM: PORTABLE CHEST 1 VIEW COMPARISON:  10/10/2015 FINDINGS: The heart size and mediastinal contours are within normal limits. Both lungs are clear. The visualized skeletal structures are unremarkable. IMPRESSION: No active disease. Electronically Signed   By: Nolon Nations M.D.   On: 11/02/2015 14:26   Dg Chest Port 1 View  10/11/2015  CLINICAL DATA:  Acute onset of fever.  Initial encounter. EXAM: PORTABLE CHEST 1 VIEW COMPARISON:  Chest radiograph performed earlier today at 5:29 p.m. FINDINGS: The lungs are well-aerated. Mild right apical opacity raises concern for pneumonia. Right perihilar prominence is relatively stable and likely reflects prominent vasculature. There is no evidence of pleural effusion or pneumothorax. The cardiomediastinal silhouette is borderline normal in size. No acute osseous abnormalities are seen. IMPRESSION: Mild right apical airspace opacity raises concern for pneumonia. Right perihilar prominence is relatively stable and likely reflects prominent vasculature, though followup PA and lateral chest X-ray is recommended in 3-4 weeks following trial of antibiotic therapy to ensure resolution and exclude underlying malignancy. Electronically Signed   By: Garald Balding M.D.   On: 10/11/2015 04:00   Dg Chest Portable 1 View  10/10/2015  CLINICAL DATA:  Multiple seizures today.  Alcohol abuse.  Fever. EXAM: PORTABLE CHEST 1 VIEW COMPARISON:  06/16/2015 FINDINGS: The heart size and mediastinal contours are  within normal limits. Both lungs are clear. The visualized skeletal structures are unremarkable. IMPRESSION: No active disease. Electronically Signed   By: Earle Gell M.D.   On: 10/10/2015 18:08    Microbiology: Recent Results (from the past 240 hour(s))  Culture, blood (Routine X 2) w Reflex to ID Panel     Status: None (Preliminary result)   Collection Time: 11/02/15  1:20 PM  Result Value Ref Range Status   Specimen Description BLOOD RIGHT HAND  Final   Special Requests BOTTLES DRAWN AEROBIC ONLY 7MLS  Final   Culture NO GROWTH 4 DAYS  Final   Report Status PENDING  Incomplete  Culture, blood (Routine X 2) w Reflex to ID Panel     Status: None (Preliminary result)   Collection Time: 11/02/15  1:40 PM  Result Value Ref Range Status   Specimen Description BLOOD LEFT HAND  Final   Special Requests IN PEDIATRIC BOTTLE 3MLS  Final   Culture NO GROWTH 4 DAYS  Final   Report Status PENDING  Incomplete  Urine culture     Status: None   Collection Time: 11/02/15  4:00 PM  Result Value Ref Range Status   Specimen Description URINE, CLEAN CATCH  Final   Special Requests NONE  Final   Culture NO GROWTH 1 DAY  Final   Report Status 11/03/2015 FINAL  Final  MRSA PCR Screening     Status: None   Collection Time: 11/02/15  7:09 PM  Result Value Ref Range Status   MRSA by PCR NEGATIVE NEGATIVE Final    Comment:        The GeneXpert MRSA Assay (FDA approved for NASAL specimens only), is one component of a comprehensive MRSA colonization surveillance program. It is not intended to diagnose MRSA infection nor to guide or monitor treatment for MRSA infections.   C difficile quick scan w PCR reflex     Status: Abnormal   Collection Time: 11/03/15  8:35 PM  Result Value Ref Range Status   C Diff antigen POSITIVE (A) NEGATIVE Final   C Diff toxin NEGATIVE NEGATIVE Final   C Diff interpretation   Final    C. difficile present, but toxin not detected. This indicates colonization. In most  cases, this does not require treatment. If patient has signs and symptoms consistent with colitis, consider treatment. Requires ENTERIC precautions.     Labs: Basic Metabolic Panel:  Recent Labs Lab 11/02/15 1340 11/04/15 0421 11/05/15 0551  NA 140 140 142  K 4.4 3.2* 3.9  CL 102 105 108  CO2 24 24 24   GLUCOSE 91 86 85  BUN 14 8 7   CREATININE 0.84 0.60 0.70  CALCIUM 9.6 9.0 9.2  MG  --  1.7 1.6*  PHOS  --  3.4 4.4   Liver Function Tests:  Recent Labs Lab 11/02/15 1340  AST 20  ALT 11*  ALKPHOS 88  BILITOT 1.2  PROT 7.8  ALBUMIN 4.2   No results for input(s): LIPASE, AMYLASE in the last 168 hours. No results for input(s): AMMONIA in the last 168 hours. CBC:  Recent Labs Lab 11/02/15 1340 11/04/15 0421 11/05/15 0551  WBC 12.2* 13.3* 7.8  NEUTROABS 9.4*  --   --   HGB 15.1* 12.7 12.0  HCT 43.8 37.6 35.7*  MCV 90.1 88.3 88.6  PLT 268 268 232   Cardiac Enzymes: No results for input(s): CKTOTAL, CKMB, CKMBINDEX, TROPONINI in the last 168 hours. BNP: BNP (last 3 results) No results for input(s): BNP in the last 8760 hours.  ProBNP (last 3 results) No results for input(s): PROBNP in the last 8760 hours.  CBG: No results for input(s): GLUCAP in the last 168 hours.     Signed:  Annita Brod  [facility] 11/06/2015, 7:22 PM

## 2015-11-07 LAB — CULTURE, BLOOD (ROUTINE X 2)
CULTURE: NO GROWTH
Culture: NO GROWTH

## 2016-02-26 ENCOUNTER — Ambulatory Visit: Payer: Medicaid Other | Admitting: Neurology

## 2016-03-03 ENCOUNTER — Encounter: Payer: Self-pay | Admitting: Neurology

## 2016-03-29 ENCOUNTER — Ambulatory Visit: Payer: Medicaid Other | Admitting: Neurology

## 2016-04-03 ENCOUNTER — Encounter (HOSPITAL_COMMUNITY): Payer: Self-pay | Admitting: *Deleted

## 2016-04-03 ENCOUNTER — Emergency Department (HOSPITAL_COMMUNITY)
Admission: EM | Admit: 2016-04-03 | Discharge: 2016-04-03 | Disposition: A | Discharge status: Home / Self Care | Payer: Medicaid Other | Attending: Emergency Medicine | Admitting: Emergency Medicine

## 2016-04-03 DIAGNOSIS — Z79899 Other long term (current) drug therapy: Secondary | ICD-10-CM | POA: Insufficient documentation

## 2016-04-03 DIAGNOSIS — G40909 Epilepsy, unspecified, not intractable, without status epilepticus: Secondary | ICD-10-CM | POA: Diagnosis present

## 2016-04-03 DIAGNOSIS — J45909 Unspecified asthma, uncomplicated: Secondary | ICD-10-CM | POA: Insufficient documentation

## 2016-04-03 DIAGNOSIS — R569 Unspecified convulsions: Secondary | ICD-10-CM

## 2016-04-03 LAB — BASIC METABOLIC PANEL
Anion gap: 6 (ref 5–15)
BUN: 15 mg/dL (ref 6–20)
CHLORIDE: 108 mmol/L (ref 101–111)
CO2: 25 mmol/L (ref 22–32)
Calcium: 9.3 mg/dL (ref 8.9–10.3)
Creatinine, Ser: 0.7 mg/dL (ref 0.44–1.00)
GFR calc Af Amer: >60 mL/min (ref >60)
GLUCOSE: 75 mg/dL (ref 65–99)
POTASSIUM: 3.9 mmol/L (ref 3.5–5.1)
Sodium: 139 mmol/L (ref 135–145)

## 2016-04-03 LAB — VALPROIC ACID LEVEL: Valproic Acid Lvl: <10 ug/mL — ABNORMAL LOW (ref 50.0–100.0)

## 2016-04-03 LAB — CBG MONITORING, ED
GLUCOSE-CAPILLARY: 86 mg/dL (ref 65–99)
GLUCOSE-CAPILLARY: 186 mg/dL — AB (ref 65–99)
Glucose-Capillary: 64 mg/dL — ABNORMAL LOW (ref 65–99)

## 2016-04-03 MED ORDER — VALPROATE SODIUM 500 MG/5ML IV SOLN
500.0000 mg | Freq: Once | INTRAVENOUS | Status: AC
  Administered 2016-04-03: 500 mg via INTRAVENOUS
  Filled 2016-04-03: qty 5

## 2016-04-03 MED ORDER — DIVALPROEX SODIUM 500 MG PO DR TAB: 500.0000 mg | DELAYED_RELEASE_TABLET | Freq: Two times a day (BID) | ORAL | Status: DC

## 2016-04-03 MED ORDER — LEVETIRACETAM 750 MG PO TABS: 750.0000 mg | ORAL_TABLET | Freq: Two times a day (BID) | ORAL | Status: DC

## 2016-04-03 MED ORDER — ENSURE ENLIVE PO LIQD
237.0000 mL | Freq: Two times a day (BID) | ORAL | Status: DC
  Administered 2016-04-03: 237 mL via ORAL
  Filled 2016-04-03: qty 237

## 2016-04-03 MED ORDER — DEXTROSE 50 % IV SOLN
50.0000 mL | Freq: Once | INTRAVENOUS | Status: AC
  Administered 2016-04-03: 50 mL via INTRAVENOUS
  Filled 2016-04-03: qty 50

## 2016-04-03 MED ORDER — SODIUM CHLORIDE 0.9 % IV SOLN
1000.0000 mg | Freq: Once | INTRAVENOUS | Status: AC
  Administered 2016-04-03: 1000 mg via INTRAVENOUS
  Filled 2016-04-03: qty 10
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
## 2016-04-03 NOTE — ED Notes (Signed)
Declined W/C at D/C and was escorted to lobby by RN. 

## 2016-04-03 NOTE — Discharge Instructions (Signed)
Resume taking your Keppra and Depakote.  Follow-up with your neurologist in 1 week for recheck of your blood levels.  Return to the emergency department if symptoms significantly worsen or change.   Seizure, Adult A seizure is abnormal electrical activity in the brain. Seizures usually last from 30 seconds to 2 minutes. There are various types of seizures. Before a seizure, you may have a warning sensation (aura) that a seizure is about to occur. An aura may include the following symptoms:   Fear or anxiety.  Nausea.  Feeling like the room is spinning (vertigo).  Vision changes, such as seeing flashing lights or spots. Common symptoms during a seizure include:  A change in attention or behavior (altered mental status).  Convulsions with rhythmic jerking movements.  Drooling.  Rapid eye movements.  Grunting.  Loss of bladder and bowel control.  Bitter taste in the mouth.  Tongue biting. After a seizure, you may feel confused and sleepy. You may also have an injury resulting from convulsions during the seizure. HOME CARE INSTRUCTIONS   If you are given medicines, take them exactly as prescribed by your health care provider.  Keep all follow-up appointments as directed by your health care provider.  Do not swim or drive or engage in risky activity during which a seizure could cause further injury to you or others until your health care provider says it is OK.  Get adequate rest.  Teach friends and family what to do if you have a seizure. They should:  Lay you on the ground to prevent a fall.  Put a cushion under your head.  Loosen any tight clothing around your neck.  Turn you on your side. If vomiting occurs, this helps keep your airway clear.  Stay with you until you recover.  Know whether or not you need emergency care. SEEK IMMEDIATE MEDICAL CARE IF:  The seizure lasts longer than 5 minutes.  The seizure is severe or you do not wake up immediately after  the seizure.  You have an altered mental status after the seizure.  You are having more frequent or worsening seizures. Someone should drive you to the emergency department or call local emergency services (911 in [place] MAKE SURE YOU:  Understand these instructions.  Will watch your condition.  Will get help right away if you are not doing well or get worse.   This information is not intended to replace advice given to you by your health care provider. Make sure you discuss any questions you have with your health care provider.   Document Released: 09/10/2000 Document Revised: 10/04/2014 Document Reviewed: 04/25/2013 Elsevier Interactive Patient Education [employer].

## 2016-04-13 ENCOUNTER — Encounter (HOSPITAL_COMMUNITY): Payer: Self-pay | Admitting: Neurology

## 2016-04-13 ENCOUNTER — Emergency Department (HOSPITAL_COMMUNITY)
Admission: EM | Admit: 2016-04-13 | Discharge: 2016-04-13 | Disposition: A | Discharge status: Home / Self Care | Payer: Medicaid Other | Attending: Emergency Medicine | Admitting: Emergency Medicine

## 2016-04-13 DIAGNOSIS — G40909 Epilepsy, unspecified, not intractable, without status epilepticus: Secondary | ICD-10-CM | POA: Diagnosis not present

## 2016-04-13 DIAGNOSIS — J45909 Unspecified asthma, uncomplicated: Secondary | ICD-10-CM | POA: Diagnosis not present

## 2016-04-13 DIAGNOSIS — Z79899 Other long term (current) drug therapy: Secondary | ICD-10-CM | POA: Diagnosis not present

## 2016-04-13 DIAGNOSIS — R569 Unspecified convulsions: Secondary | ICD-10-CM

## 2016-04-13 DIAGNOSIS — F17200 Nicotine dependence, unspecified, uncomplicated: Secondary | ICD-10-CM | POA: Insufficient documentation

## 2016-04-13 LAB — URINALYSIS, ROUTINE W REFLEX MICROSCOPIC
Bilirubin Urine: NEGATIVE
Glucose, UA: NEGATIVE mg/dL
Hgb urine dipstick: NEGATIVE
KETONES UR: NEGATIVE mg/dL
LEUKOCYTES UA: NEGATIVE
NITRITE: NEGATIVE
PH: 7.5 (ref 5.0–8.0)
PROTEIN: NEGATIVE mg/dL
Specific Gravity, Urine: 1.015 (ref 1.005–1.030)

## 2016-04-13 LAB — CBG MONITORING, ED
Glucose-Capillary: 119 mg/dL — ABNORMAL HIGH (ref 65–99)
Glucose-Capillary: 123 mg/dL — ABNORMAL HIGH (ref 65–99)

## 2016-04-13 MED ORDER — NAPROXEN 250 MG PO TABS
500.0000 mg | ORAL_TABLET | Freq: Once | ORAL | Status: AC
  Administered 2016-04-13: 500 mg via ORAL
  Filled 2016-04-13: qty 2

## 2016-04-13 MED ORDER — DIVALPROEX SODIUM 500 MG PO DR TAB: 500.0000 mg | DELAYED_RELEASE_TABLET | Freq: Two times a day (BID) | ORAL | Status: DC

## 2016-04-13 MED ORDER — DIVALPROEX SODIUM 250 MG PO DR TAB
500.0000 mg | DELAYED_RELEASE_TABLET | Freq: Once | ORAL | Status: AC
  Administered 2016-04-13: 500 mg via ORAL
  Filled 2016-04-13: qty 2

## 2016-04-13 MED ORDER — LEVETIRACETAM 750 MG PO TABS: 750.0000 mg | ORAL_TABLET | Freq: Two times a day (BID) | ORAL | Status: DC

## 2016-04-13 MED ORDER — LEVETIRACETAM 750 MG PO TABS
750.0000 mg | ORAL_TABLET | Freq: Once | ORAL | Status: AC
  Administered 2016-04-13: 750 mg via ORAL
  Filled 2016-04-13: qty 1

## 2016-04-13 NOTE — Discharge Instructions (Signed)
Lindsey [name], [name] were evaluated after seizures. Because you had no further events, we believe you are safe to go home. If you are unable to afford the co-pay on your medications, you can get a co-pay A waiver to pick up your medications when they are due until you can pay for your medications later. Your urine did not show signs of infection.   Seizure, Adult A seizure is abnormal electrical activity in the brain. Seizures usually last from 30 seconds to 2 minutes. There are various types of seizures. Before a seizure, you may have a warning sensation (aura) that a seizure is about to occur. An aura may include the following symptoms:   Fear or anxiety.  Nausea.  Feeling like the room is spinning (vertigo).  Vision changes, such as seeing flashing lights or spots. Common symptoms during a seizure include:  A change in attention or behavior (altered mental status).  Convulsions with rhythmic jerking movements.  Drooling.  Rapid eye movements.  Grunting.  Loss of bladder and bowel control.  Bitter taste in the mouth.  Tongue biting. After a seizure, you may feel confused and sleepy. You may also have an injury resulting from convulsions during the seizure. HOME CARE INSTRUCTIONS   If you are given medicines, take them exactly as prescribed by your health care provider.  Keep all follow-up appointments as directed by your health care provider.  Do not swim or drive or engage in risky activity during which a seizure could cause further injury to you or others until your health care provider says it is OK.  Get adequate rest.  Teach friends and family what to do if you have a seizure. They should:  Lay you on the ground to prevent a fall.  Put a cushion under your head.  Loosen any tight clothing around your neck.  Turn you on your side. If vomiting occurs, this helps keep your airway clear.  Stay with you until you recover.  Know whether or not you need emergency  care. SEEK IMMEDIATE MEDICAL CARE IF:  The seizure lasts longer than 5 minutes.  The seizure is severe or you do not wake up immediately after the seizure.  You have an altered mental status after the seizure.  You are having more frequent or worsening seizures. Someone should drive you to the emergency department or call local emergency services (911 in [place] MAKE SURE YOU:  Understand these instructions.  Will watch your condition.  Will get help right away if you are not doing well or get worse.   This information is not intended to replace advice given to you by your health care provider. Make sure you discuss any questions you have with your health care provider.   Document Released: 09/10/2000 Document Revised: 10/04/2014 Document Reviewed: 04/25/2013 Elsevier Interactive Patient Education [employer].

## 2016-04-13 NOTE — ED Notes (Signed)
Care manager called returned page and reported that pt can get meds from pharmacy even if she can't afford them because she had Medicaid she just has to tell them she can't afford them.
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
## 2016-04-13 NOTE — Progress Notes (Addendum)
Medicaid [place] access covered pt with discounted medication program via medicaid of $3 or less [facility] does not have a program to assist this pt at this time  ED CM spoke with [facility] ED RN, Judson Roch about this Judson Roch states ED SW discussed with her that [facility] [address] may be able to assist  This CM discussed that any pharmacy can assist pt if she discusses having issues with medication co pay A waiver may be given until pt can pay for meds ED Cm inquired if ED RN would like for CM to speak with pt about this but was informed ED RN and EDP would speak with pt about this   Entered in d/c instructions  [facility] Schedule an appointment as soon as possible for a visit on 04/13/2016 As needed This is your assigned Medicaid [place] access doctor If you prefer to see another Medicaid doctor other than the one on your Medicaid card [place] access response hx indicates the assigned pcp is [facility] [place], [place] [phone] medicaid [place] access patient [email or web address] Use this website to assist with understanding your coverage & to renew application [id number], [place], [place] [address] As a Medicaid client you MUST contact DSS/SSI each time you change address, move to another [place] or another state to keep your address updated  Alleen Borne Medicaid Transportation to Dr appts if you are have full Medicaid: [phone] medicaid medications On 04/13/2016 As needed Medicaid [place] access co pays generally $3 or less Speak with your local pharmacy about a waiver for co pay if needed, Check with financial assistance programs or churches locally for assistance with your medication co pays

## 2016-04-13 NOTE — ED Notes (Signed)
Per RN who took report, family reports pt has been out of depakote and kepra for 6 days and has been having seizures since 4 am this morning. When EMS arrived her sugar read "low" gave 1 mg Im glucagon. Pt was biting and hitting. Family reports at baseline she is agitated and uncooperative. Also, EMS started an IV but pt pulled it out.

## 2016-04-13 NOTE — ED Notes (Signed)
Pt is here from EMS for seizures, pt is uncooperative and does not want to talk with staff. Reports she takes Kepra and depakote, hasn't been taking but is unsure for how long. Pt refusing for RN to start IV or stick her for blood.

## 2016-04-27 ENCOUNTER — Encounter: Payer: Self-pay | Admitting: Neurology

## 2016-04-27 ENCOUNTER — Ambulatory Visit (INDEPENDENT_AMBULATORY_CARE_PROVIDER_SITE_OTHER): Payer: Medicaid Other | Admitting: Neurology

## 2016-04-27 VITALS — BP 151/81 | HR 83 | Ht 68.0 in | Wt 146.8 lb

## 2016-04-27 DIAGNOSIS — R569 Unspecified convulsions: Secondary | ICD-10-CM | POA: Diagnosis not present

## 2016-04-27 DIAGNOSIS — F1910 Other psychoactive substance abuse, uncomplicated: Secondary | ICD-10-CM

## 2016-04-27 MED ORDER — DIVALPROEX SODIUM 500 MG PO DR TAB: 500.0000 mg | DELAYED_RELEASE_TABLET | Freq: Two times a day (BID) | ORAL | 3 refills | Status: DC

## 2016-04-27 NOTE — Patient Instructions (Addendum)
Restart the Depakote taking 500 mg twice a day. Stop the Keppra 750 mg tablet.   Epilepsy Epilepsy is a disorder in which a person has repeated seizures over time. A seizure is a release of abnormal electrical activity in the brain. Seizures can cause a change in attention, behavior, or the ability to remain awake and alert (altered mental status). Seizures often involve uncontrollable shaking (convulsions).  Most people with epilepsy lead normal lives. However, people with epilepsy are at an increased risk of falls, accidents, and injuries. Therefore, it is important to begin treatment right away. CAUSES  Epilepsy has many possible causes. Anything that disturbs the normal pattern of brain cell activity can lead to seizures. This may include:   Head injury.  Birth trauma.  High fever as a child.  Stroke.  Bleeding into or around the brain.  Certain drugs.  Prolonged low oxygen, such as what occurs after CPR efforts.  Abnormal brain development.  Certain illnesses, such as meningitis, encephalitis (brain infection), malaria, and other infections.  An imbalance of nerve signaling chemicals (neurotransmitters).  SIGNS AND SYMPTOMS  The symptoms of a seizure can vary greatly from one person to another. Right before a seizure, you may have a warning (aura) that a seizure is about to occur. An aura may include the following symptoms:  Fear or anxiety.  Nausea.  Feeling like the room is spinning (vertigo).  Vision changes, such as seeing flashing lights or spots. Common symptoms during a seizure include:  Abnormal sensations, such as an abnormal smell or a bitter taste in the mouth.   Sudden, general body stiffness.   Convulsions that involve rhythmic jerking of the face, arm, or leg on one or both sides.   Sudden change in consciousness.   Appearing to be awake but not responding.   Appearing to be asleep but cannot be awakened.   Grimacing, chewing, lip  smacking, drooling, tongue biting, or loss of bowel or bladder control. After a seizure, you may feel sleepy for a while. DIAGNOSIS  Your health care provider will ask about your symptoms and take a medical history. Descriptions from any witnesses to your seizures will be very helpful in the diagnosis. A physical exam, including a detailed neurological exam, is necessary. Various tests may be done, such as:   An electroencephalogram (EEG). This is a painless test of your brain waves. In this test, a diagram is created of your brain waves. These diagrams can be interpreted by a specialist.  An MRI of the brain.   A CT scan of the brain.   A spinal tap (lumbar puncture, LP).  Blood tests to check for signs of infection or abnormal blood chemistry. TREATMENT  There is no cure for epilepsy, but it is generally treatable. Once epilepsy is diagnosed, it is important to begin treatment as soon as possible. For most people with epilepsy, seizures can be controlled with medicines. The following may also be used:  A pacemaker for the brain (vagus nerve stimulator) can be used for people with seizures that are not well controlled by medicine.  Surgery on the brain. For some people, epilepsy eventually goes away. HOME CARE INSTRUCTIONS   Follow your health care provider's recommendations on driving and safety in normal activities.  Get enough rest. Lack of sleep can cause seizures.  Only take over-the-counter or prescription medicines as directed by your health care provider. Take any prescribed medicine exactly as directed.  Avoid any known triggers of your seizures.  Keep a seizure diary. Record what you recall about any seizure, especially any possible trigger.   Make sure the people you live and work with know that you are prone to seizures. They should receive instructions on how to help you. In general, a witness to a seizure should:   Cushion your head and body.   Turn you on  your side.   Avoid unnecessarily restraining you.   Not place anything inside your mouth.   Call for emergency medical help if there is any question about what has occurred.   Follow up with your health care provider as directed. You may need regular blood tests to monitor the levels of your medicine.  SEEK MEDICAL CARE IF:   You develop signs of infection or other illness. This might increase the risk of a seizure.   You seem to be having more frequent seizures.   Your seizure pattern is changing.  SEEK IMMEDIATE MEDICAL CARE IF:   You have a seizure that does not stop after a few moments.   You have a seizure that causes any difficulty in breathing.   You have a seizure that results in a very severe headache.   You have a seizure that leaves you with the inability to speak or use a part of your body.    This information is not intended to replace advice given to you by your health care provider. Make sure you discuss any questions you have with your health care provider.   Document Released: 09/13/2005 Document Revised: 07/04/2013 Document Reviewed: 04/25/2013 Elsevier Interactive Patient Education [employer].
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
## 2016-06-24 ENCOUNTER — Inpatient Hospital Stay
Admission: EM | Admit: 2016-06-24 | Discharge: 2016-06-26 | DRG: 101 | Disposition: A | Discharge status: Home Health | Payer: Medicaid Other | Attending: Internal Medicine | Admitting: Internal Medicine

## 2016-06-24 ENCOUNTER — Encounter: Payer: Self-pay | Admitting: Emergency Medicine

## 2016-06-24 ENCOUNTER — Emergency Department: Payer: Medicaid Other

## 2016-06-24 DIAGNOSIS — Z720 Tobacco use: Secondary | ICD-10-CM

## 2016-06-24 DIAGNOSIS — Z9114 Patient's other noncompliance with medication regimen: Secondary | ICD-10-CM

## 2016-06-24 DIAGNOSIS — K219 Gastro-esophageal reflux disease without esophagitis: Secondary | ICD-10-CM | POA: Diagnosis present

## 2016-06-24 DIAGNOSIS — F10939 Alcohol use, unspecified with withdrawal, unspecified: Secondary | ICD-10-CM

## 2016-06-24 DIAGNOSIS — R569 Unspecified convulsions: Secondary | ICD-10-CM

## 2016-06-24 DIAGNOSIS — Z9119 Patient's noncompliance with other medical treatment and regimen: Secondary | ICD-10-CM

## 2016-06-24 DIAGNOSIS — F10239 Alcohol dependence with withdrawal, unspecified: Secondary | ICD-10-CM

## 2016-06-24 DIAGNOSIS — F17210 Nicotine dependence, cigarettes, uncomplicated: Secondary | ICD-10-CM | POA: Diagnosis present

## 2016-06-24 DIAGNOSIS — Z91199 Patient's noncompliance with other medical treatment and regimen due to unspecified reason: Secondary | ICD-10-CM

## 2016-06-24 DIAGNOSIS — IMO0002 Reserved for concepts with insufficient information to code with codable children: Secondary | ICD-10-CM

## 2016-06-24 DIAGNOSIS — G40409 Other generalized epilepsy and epileptic syndromes, not intractable, without status epilepticus: Principal | ICD-10-CM | POA: Diagnosis present

## 2016-06-24 DIAGNOSIS — Z8249 Family history of ischemic heart disease and other diseases of the circulatory system: Secondary | ICD-10-CM

## 2016-06-24 DIAGNOSIS — I10 Essential (primary) hypertension: Secondary | ICD-10-CM | POA: Diagnosis present

## 2016-06-24 DIAGNOSIS — D72829 Elevated white blood cell count, unspecified: Secondary | ICD-10-CM | POA: Diagnosis present

## 2016-06-24 DIAGNOSIS — Z91120 Patient's intentional underdosing of medication regimen due to financial hardship: Secondary | ICD-10-CM

## 2016-06-24 DIAGNOSIS — Z811 Family history of alcohol abuse and dependence: Secondary | ICD-10-CM

## 2016-06-24 DIAGNOSIS — J45909 Unspecified asthma, uncomplicated: Secondary | ICD-10-CM | POA: Diagnosis present

## 2016-06-24 DIAGNOSIS — R739 Hyperglycemia, unspecified: Secondary | ICD-10-CM | POA: Diagnosis present

## 2016-06-24 LAB — URINALYSIS COMPLETE WITH MICROSCOPIC (ARMC ONLY)
BILIRUBIN URINE: NEGATIVE
Bacteria, UA: NONE SEEN
GLUCOSE, UA: NEGATIVE mg/dL
KETONES UR: NEGATIVE mg/dL
LEUKOCYTES UA: NEGATIVE
NITRITE: NEGATIVE
Protein, ur: 30 mg/dL — AB
SPECIFIC GRAVITY, URINE: 1.014 (ref 1.005–1.030)
Squamous Epithelial / LPF: NONE SEEN
pH: 6 (ref 5.0–8.0)

## 2016-06-24 LAB — COMPREHENSIVE METABOLIC PANEL
ALBUMIN: 4 g/dL (ref 3.5–5.0)
ALT: 11 U/L — ABNORMAL LOW (ref 14–54)
ANION GAP: 7 (ref 5–15)
AST: 25 U/L (ref 15–41)
Alkaline Phosphatase: 85 U/L (ref 38–126)
BILIRUBIN TOTAL: 1.2 mg/dL (ref 0.3–1.2)
BUN: 12 mg/dL (ref 6–20)
CHLORIDE: 105 mmol/L (ref 101–111)
CO2: 26 mmol/L (ref 22–32)
Calcium: 9.3 mg/dL (ref 8.9–10.3)
Creatinine, Ser: 0.81 mg/dL (ref 0.44–1.00)
GFR calc Af Amer: >60 mL/min (ref >60)
Glucose, Bld: 105 mg/dL — ABNORMAL HIGH (ref 65–99)
POTASSIUM: 4 mmol/L (ref 3.5–5.1)
Sodium: 138 mmol/L (ref 135–145)
TOTAL PROTEIN: 7.5 g/dL (ref 6.5–8.1)

## 2016-06-24 LAB — CBC WITH DIFFERENTIAL/PLATELET
Basophils Absolute: 0 10*3/uL (ref 0–0.1)
Basophils Relative: 0 %
EOS ABS: 0 10*3/uL (ref 0–0.7)
Eosinophils Relative: 0 %
HCT: 41.8 % (ref 35.0–47.0)
HEMOGLOBIN: 14.1 g/dL (ref 12.0–16.0)
LYMPHS ABS: 1.3 10*3/uL (ref 1.0–3.6)
LYMPHS PCT: 9 %
MCH: 29.9 pg (ref 26.0–34.0)
MCHC: 33.6 g/dL (ref 32.0–36.0)
MCV: 88.9 fL (ref 80.0–100.0)
Monocytes Absolute: 1.2 10*3/uL — ABNORMAL HIGH (ref 0.2–0.9)
Monocytes Relative: 9 %
NEUTROS PCT: 82 %
Neutro Abs: 11.6 10*3/uL — ABNORMAL HIGH (ref 1.4–6.5)
PLATELETS: 361 10*3/uL (ref 150–440)
RBC: 4.7 MIL/uL (ref 3.80–5.20)
RDW: 13.8 % (ref 11.5–14.5)
WBC: 14.1 10*3/uL — AB (ref 3.6–11.0)

## 2016-06-24 LAB — URINE DRUG SCREEN, QUALITATIVE (ARMC ONLY)
AMPHETAMINES, UR SCREEN: NOT DETECTED
BENZODIAZEPINE, UR SCRN: NOT DETECTED
Barbiturates, Ur Screen: NOT DETECTED
CANNABINOID 50 NG, UR ~~LOC~~: NOT DETECTED
Cocaine Metabolite,Ur ~~LOC~~: NOT DETECTED
MDMA (ECSTASY) UR SCREEN: NOT DETECTED
Methadone Scn, Ur: NOT DETECTED
Opiate, Ur Screen: NOT DETECTED
Phencyclidine (PCP) Ur S: NOT DETECTED
Tricyclic, Ur Screen: NOT DETECTED

## 2016-06-24 LAB — TROPONIN I

## 2016-06-24 LAB — VALPROIC ACID LEVEL

## 2016-06-24 LAB — ETHANOL: Alcohol, Ethyl (B): <5 mg/dL (ref <5)

## 2016-06-24 MED ORDER — SODIUM CHLORIDE 0.9 % IV SOLN
1000.0000 mL | Freq: Once | INTRAVENOUS | Status: AC
  Administered 2016-06-24: 1000 mL via INTRAVENOUS

## 2016-06-24 MED ORDER — VALPROATE SODIUM 500 MG/5ML IV SOLN
500.0000 mg | Freq: Once | INTRAVENOUS | Status: AC
  Administered 2016-06-24: 500 mg via INTRAVENOUS
  Filled 2016-06-24 (×2): qty 5

## 2016-06-24 MED ORDER — LORAZEPAM 2 MG/ML IJ SOLN: 2.0000 mg | Freq: Once | INTRAMUSCULAR | Status: DC

## 2016-06-24 MED ORDER — HALOPERIDOL LACTATE 5 MG/ML IJ SOLN
5.0000 mg | Freq: Once | INTRAMUSCULAR | Status: AC
  Administered 2016-06-24: 5 mg via INTRAMUSCULAR

## 2016-06-24 MED ORDER — LORAZEPAM 2 MG/ML IJ SOLN
INTRAMUSCULAR | Status: AC
Start: 2016-06-24 — End: 2016-06-24
  Administered 2016-06-24: 2 mg via INTRAVENOUS
  Filled 2016-06-24: qty 1

## 2016-06-24 MED ORDER — ACETAMINOPHEN 650 MG RE SUPP
650.0000 mg | Freq: Once | RECTAL | Status: AC
  Administered 2016-06-24: 650 mg via RECTAL
  Filled 2016-06-24: qty 1

## 2016-06-24 MED ORDER — LORAZEPAM 2 MG/ML IJ SOLN
2.0000 mg | Freq: Once | INTRAMUSCULAR | Status: AC
  Administered 2016-06-24: 2 mg via INTRAVENOUS

## 2016-06-24 MED ORDER — SODIUM CHLORIDE 0.9 % IV SOLN
1000.0000 mg | Freq: Once | INTRAVENOUS | Status: AC
  Administered 2016-06-24: 1000 mg via INTRAVENOUS
  Filled 2016-06-24: qty 10

## 2016-06-24 NOTE — ED Provider Notes (Signed)
[facility] Emergency Department Provider Note     L5 caveat: Review of systems is limited by altered mental status   Time seen: ----------------------------------------- 6:17 PM on 06/24/2016 -----------------------------------------    I have reviewed the triage vital signs and the nursing notes.   HISTORY  Chief Complaint Seizures    HPI Lindsey [name] is a 60 y.o. female who presents to ER for altered mental status. According to family she had a seizure like event at home. EMS arrived and found her naked laying on the floor. It is unclear if she fell or had any head trauma.Patient cannot get any review of systems or report. She arrives confused and combative   Past Medical History:  Diagnosis Date  . Asthma   . H/O ETOH abuse   . Seizure [facility])     Patient Active Problem List   Diagnosis Date Noted  . Psychosis 11/04/2015  . Acute respiratory failure with hypoxia ([place]) 11/03/2015  . Essential hypertension 11/03/2015  . 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 Asthma 10/10/2015  . Seizure disorder ([place]) 08/15/2015  . Status epilepticus ([place]) 08/15/2015  . Perinephric hematoma   . Head trauma   . Post-ictal state ([place]) 03/01/2015  . Seizure secondary to subtherapeutic anticonvulsant medication ([place]) 02/06/2015  . Recurrent seizures ([place]) 02/06/2015  . Acute encephalopathy   . Polysubstance abuse   . Alcohol dependence with withdrawal with complication ([place]) [id number]  . Protein-calorie malnutrition, severe ([place]) 03/04/2014  . Encephalopathy 03/04/2014  . Fever 03/04/2014  . Alcohol abuse 11/06/2013  . Alcohol withdrawal ([place]) 11/06/2013  . Marijuana abuse 11/06/2013  . Seizure ([place]) 11/05/2013  . Altered mental status 11/05/2013    Past Surgical History:  Procedure Laterality Date  . ABDOMINAL HYSTERECTOMY    . GALLBLADDER SURGERY      Allergies Kiwi extract; Morphine and related; Penicillins; and Strawberry  extract  Social History Social History  Substance Use Topics  . Smoking status: Current Some Day Smoker    Types: Cigarettes  . Smokeless tobacco: Never Used  . Alcohol use No     Comment: Hx of alchol abuse, not currently drinking    Review of Systems Review of systems is unknown at this time  ____________________________________________   PHYSICAL EXAM:  VITAL SIGNS: ED Triage Vitals  Enc Vitals Group     BP      Pulse      Resp      Temp      Temp src      SpO2      Weight      Height      Head Circumference      Peak Flow      Pain Score      Pain Loc      Pain Edu?      Excl. in [place]?     Constitutional: Patient resting with eyes closed, will not follow commands Eyes: Conjunctivae are normal. PERRL. Normal extraocular movements. ENT   Head: Normocephalic and atraumatic.   Nose: No congestion/rhinnorhea.   Mouth/Throat: Mucous membranes are moist.   Neck: No stridor. Cardiovascular: Normal rate, regular rhythm. No murmurs, rubs, or gallops. Respiratory: Normal respiratory effort without tachypnea nor retractions. Breath sounds are clear and equal bilaterally. No wheezes/rales/rhonchi. Gastrointestinal: Soft and nontender. Normal bowel sounds Musculoskeletal: Nontender with normal range of motion in all extremities. No lower extremity tenderness nor edema. Neurologic:  Normal speech and language. No gross focal neurologic deficits are appreciated.  Skin:  Skin is warm, dry and intact. No rash noted. Psychiatric: Patient is combative whenever stimulated ____________________________________________  EKG: Interpreted by me.Sinus tachycardia with rate of 125 bpm, normal PR interval, normal QRS, normal QT interval. Normal axis.  ____________________________________________  ED COURSE:  Pertinent labs & imaging results that were available during my care of the patient were reviewed by me and considered in my medical decision making (see chart for  details). Clinical Course  Comment By Time  22-gauge IV was placed by me in the right external jugular vein. Earleen Newport, MD 09/28 [phone]  Patient required repeat doses of Ativan and I gave her IM Haldol for agitation. Earleen Newport, MD 09/28 1850  Patient presents the ER for altered mental status. We will assess with basic labs and imaging. She'll receive IV Ativan.  Procedures ____________________________________________   LABS (pertinent positives/negatives)  Labs Reviewed  CBC WITH DIFFERENTIAL/PLATELET  COMPREHENSIVE METABOLIC PANEL  TROPONIN I  URINALYSIS COMPLETEWITH MICROSCOPIC ([facility] ONLY)  ETHANOL  URINE DRUG SCREEN, QUALITATIVE ([facility] ONLY)  VALPROIC ACID LEVEL  CRITICAL CARE Performed by: Earleen Newport   Total critical care time: 30 minutes  Critical care time was exclusive of separately billable procedures and treating other patients.  Critical care was necessary to treat or prevent imminent or life-threatening deterioration.  Critical care was time spent personally by me on the following activities: development of treatment plan with patient and/or surrogate as well as nursing, discussions with consultants, evaluation of patient's response to treatment, examination of patient, obtaining history from patient or surrogate, ordering and performing treatments and interventions, ordering and review of laboratory studies, ordering and review of radiographic studies, pulse oximetry and re-evaluation of patient's condition.   RADIOLOGY Images were viewed by me  CT head IMPRESSION: 1. No acute intracranial pathology seen on CT. 2. Mild cortical volume loss and scattered small vessel ischemic microangiopathy.  ____________________________________________  FINAL ASSESSMENT AND PLAN  Altered mental status, seizure, Medication noncompliance, alcohol use disorder  Plan: Patient with labs and imaging as dictated above. Patient likely with withdrawal  seizures or mild DTs. She was initially given Ativan and Haldol. She remains sedated but agitated whenever stimulated. I have consulted Dr. Stevenson Clinch who has evaluated the patient in the ER recommends a stepdown bed. Currently she appears stable for admission.   Earleen Newport, MD   Note: This dictation was prepared with Dragon dictation. Any transcriptional errors that result from this process are unintentional    Earleen Newport, MD 06/24/16 2138

## 2016-06-24 NOTE — ED Triage Notes (Signed)
Per [facility], patient comes from home after having what family describes as "seizure like activity". Patient has been off of her medication for 2 days. Family is unable to state which medication she has not taken but state she gets very confused when not on meds. Patient fell today, while seizing, from her bed. Lips are swollen. No bleeding noted. Patient is combative and yelling out. MD at bedside.

## 2016-06-24 NOTE — ED Notes (Signed)
Patient is resting much more comfortably. Even and non labored respirations noted.

## 2016-06-24 NOTE — ED Notes (Signed)
Resumed care from [place] rn.  Pt sleeping.  siderails up x 2.  primedoc with pt for admission.

## 2016-06-24 NOTE — ED Notes (Signed)
Pt sleeping.  Pt waiting on admission.  siderails up x 2.

## 2016-06-25 DIAGNOSIS — K219 Gastro-esophageal reflux disease without esophagitis: Secondary | ICD-10-CM | POA: Diagnosis present

## 2016-06-25 DIAGNOSIS — G40409 Other generalized epilepsy and epileptic syndromes, not intractable, without status epilepticus: Secondary | ICD-10-CM | POA: Diagnosis not present

## 2016-06-25 DIAGNOSIS — F10239 Alcohol dependence with withdrawal, unspecified: Secondary | ICD-10-CM | POA: Diagnosis present

## 2016-06-25 DIAGNOSIS — Z91120 Patient's intentional underdosing of medication regimen due to financial hardship: Secondary | ICD-10-CM | POA: Diagnosis not present

## 2016-06-25 DIAGNOSIS — Z9119 Patient's noncompliance with other medical treatment and regimen: Secondary | ICD-10-CM | POA: Diagnosis not present

## 2016-06-25 DIAGNOSIS — R4182 Altered mental status, unspecified: Secondary | ICD-10-CM | POA: Diagnosis present

## 2016-06-25 DIAGNOSIS — Z8249 Family history of ischemic heart disease and other diseases of the circulatory system: Secondary | ICD-10-CM | POA: Diagnosis not present

## 2016-06-25 DIAGNOSIS — Z9114 Patient's other noncompliance with medication regimen: Secondary | ICD-10-CM | POA: Diagnosis not present

## 2016-06-25 DIAGNOSIS — I10 Essential (primary) hypertension: Secondary | ICD-10-CM | POA: Diagnosis present

## 2016-06-25 DIAGNOSIS — F17210 Nicotine dependence, cigarettes, uncomplicated: Secondary | ICD-10-CM | POA: Diagnosis present

## 2016-06-25 DIAGNOSIS — R739 Hyperglycemia, unspecified: Secondary | ICD-10-CM | POA: Diagnosis present

## 2016-06-25 DIAGNOSIS — R569 Unspecified convulsions: Secondary | ICD-10-CM

## 2016-06-25 DIAGNOSIS — F10939 Alcohol use, unspecified with withdrawal, unspecified: Secondary | ICD-10-CM

## 2016-06-25 DIAGNOSIS — Z811 Family history of alcohol abuse and dependence: Secondary | ICD-10-CM | POA: Diagnosis not present

## 2016-06-25 DIAGNOSIS — J45909 Unspecified asthma, uncomplicated: Secondary | ICD-10-CM | POA: Diagnosis present

## 2016-06-25 DIAGNOSIS — D72829 Elevated white blood cell count, unspecified: Secondary | ICD-10-CM | POA: Diagnosis present

## 2016-06-25 LAB — CBC
HCT: 36 % (ref 35.0–47.0)
Hemoglobin: 12.5 g/dL (ref 12.0–16.0)
MCH: 30.6 pg (ref 26.0–34.0)
MCHC: 34.7 g/dL (ref 32.0–36.0)
MCV: 87.9 fL (ref 80.0–100.0)
PLATELETS: 294 10*3/uL (ref 150–440)
RBC: 4.09 MIL/uL (ref 3.80–5.20)
RDW: 13.9 % (ref 11.5–14.5)
WBC: 10.3 10*3/uL (ref 3.6–11.0)

## 2016-06-25 LAB — BASIC METABOLIC PANEL
Anion gap: 7 (ref 5–15)
BUN: 10 mg/dL (ref 6–20)
CALCIUM: 8.8 mg/dL — AB (ref 8.9–10.3)
CHLORIDE: 110 mmol/L (ref 101–111)
CO2: 23 mmol/L (ref 22–32)
CREATININE: 0.65 mg/dL (ref 0.44–1.00)
Glucose, Bld: 87 mg/dL (ref 65–99)
Potassium: 3.5 mmol/L (ref 3.5–5.1)
SODIUM: 140 mmol/L (ref 135–145)

## 2016-06-25 LAB — MAGNESIUM: MAGNESIUM: 1.7 mg/dL (ref 1.7–2.4)

## 2016-06-25 LAB — MRSA PCR SCREENING: MRSA BY PCR: NEGATIVE

## 2016-06-25 LAB — PHOSPHORUS: PHOSPHORUS: 2.5 mg/dL (ref 2.5–4.6)

## 2016-06-25 MED ORDER — M.V.I. ADULT IV INJ
INJECTION | Freq: Once | INTRAVENOUS | Status: AC
  Administered 2016-06-25: 04:00:00 via INTRAVENOUS
  Filled 2016-06-25: qty 1000

## 2016-06-25 MED ORDER — NICOTINE 21 MG/24HR TD PT24
21.0000 mg | MEDICATED_PATCH | Freq: Every day | TRANSDERMAL | Status: DC
Start: 2016-06-25 — End: 2016-06-26
  Administered 2016-06-25 – 2016-06-26 (×2): 21 mg via TRANSDERMAL
  Filled 2016-06-25 (×2): qty 1

## 2016-06-25 MED ORDER — SODIUM CHLORIDE 0.9 % IV SOLN: INTRAVENOUS | Status: DC

## 2016-06-25 MED ORDER — ENOXAPARIN SODIUM 40 MG/0.4ML ~~LOC~~ SOLN
40.0000 mg | Freq: Every day | SUBCUTANEOUS | Status: DC
  Administered 2016-06-25 – 2016-06-26 (×2): 40 mg via SUBCUTANEOUS
  Filled 2016-06-25 (×2): qty 0.4

## 2016-06-25 MED ORDER — FOLIC ACID 1 MG PO TABS
1.0000 mg | ORAL_TABLET | Freq: Every day | ORAL | Status: DC
  Administered 2016-06-26: 1 mg via ORAL
  Filled 2016-06-25: qty 1

## 2016-06-25 MED ORDER — LORAZEPAM 2 MG/ML IJ SOLN
2.0000 mg | INTRAMUSCULAR | Status: DC | PRN
  Administered 2016-06-26: 2 mg via INTRAVENOUS
  Filled 2016-06-25: qty 1

## 2016-06-25 MED ORDER — ONDANSETRON HCL 4 MG/2ML IJ SOLN: 4.0000 mg | Freq: Four times a day (QID) | INTRAMUSCULAR | Status: DC | PRN

## 2016-06-25 MED ORDER — ACETAMINOPHEN 325 MG PO TABS
650.0000 mg | ORAL_TABLET | Freq: Four times a day (QID) | ORAL | Status: DC | PRN
  Administered 2016-06-25 (×2): 650 mg via ORAL
  Filled 2016-06-25 (×2): qty 2

## 2016-06-25 MED ORDER — ONDANSETRON HCL 4 MG PO TABS: 4.0000 mg | ORAL_TABLET | Freq: Four times a day (QID) | ORAL | Status: DC | PRN

## 2016-06-25 MED ORDER — SODIUM CHLORIDE 0.9% FLUSH
3.0000 mL | Freq: Two times a day (BID) | INTRAVENOUS | Status: DC
  Administered 2016-06-25 – 2016-06-26 (×4): 3 mL via INTRAVENOUS

## 2016-06-25 MED ORDER — AMLODIPINE BESYLATE 5 MG PO TABS
5.0000 mg | ORAL_TABLET | Freq: Every day | ORAL | Status: DC
  Administered 2016-06-25 – 2016-06-26 (×2): 5 mg via ORAL
  Filled 2016-06-25 (×2): qty 1

## 2016-06-25 MED ORDER — IPRATROPIUM-ALBUTEROL 0.5-2.5 (3) MG/3ML IN SOLN: 3.0000 mL | Freq: Four times a day (QID) | RESPIRATORY_TRACT | Status: DC | PRN

## 2016-06-25 MED ORDER — VALPROATE SODIUM 500 MG/5ML IV SOLN
500.0000 mg | Freq: Two times a day (BID) | INTRAVENOUS | Status: DC
  Administered 2016-06-25 – 2016-06-26 (×3): 500 mg via INTRAVENOUS
  Filled 2016-06-25 (×5): qty 5

## 2016-06-25 MED ORDER — VITAMIN B-1 100 MG PO TABS
300.0000 mg | ORAL_TABLET | Freq: Every day | ORAL | Status: DC
  Administered 2016-06-26: 300 mg via ORAL
  Filled 2016-06-25: qty 3

## 2016-06-25 MED ORDER — ACETAMINOPHEN 650 MG RE SUPP: 650.0000 mg | Freq: Four times a day (QID) | RECTAL | Status: DC | PRN

## 2016-06-25 MED ORDER — ADULT MULTIVITAMIN W/MINERALS CH
1.0000 | ORAL_TABLET | Freq: Every day | ORAL | Status: DC
  Administered 2016-06-26: 1 via ORAL
  Filled 2016-06-25: qty 1

## 2016-06-25 NOTE — ED Notes (Signed)
Pt sleeping, but easily aroused.  siderails up x 2.  No seizure activity noted.  sidepads on rails.

## 2016-06-25 NOTE — Progress Notes (Signed)
Patient confused and uncooperative with neuro assessment on arrival to unit.  No family members present at this time for admission assessment history.  Sitter at bedside

## 2016-06-25 NOTE — H&P (Signed)
[place] @ [facility] Admission History and Physical [employer], D.O.  ---------------------------------------------------------------------------------------------------------------------   PATIENT NAME: Lindsey [name] MR#: [id number] DATE OF BIRTH: 01/10/1956 DATE OF ADMISSION: 06/24/2016 PRIMARY CARE PHYSICIAN: PROVIDER NOT IN SYSTEM  REQUESTING/REFERRING PHYSICIAN: ED Dr. Jimmye Norman  CHIEF COMPLAINT: Chief Complaint  Patient presents with  . Seizures    HISTORY OF PRESENT ILLNESS:Please note the entire history has been obtained from the ED notes and physician secondary to patient's altered mental status Lindsey [name] is a 60 y.o. female with a known history of asthma, EtOH, seizure disorder was in a usual state of health until this afternoon when apparently she had a seizure at home. Apparently EMS found her lying on the floor naked, unclear if there was any fall or head trauma. In the emergency department she received Keppra, Depakote, Haldol and Ativan as well as Tylenol and diabetes fluids  Of note patient's chart review reveals 3 emergency department visits this year for similar situations as well as 1 psychiatric admission for psychosis..  Review of neurology notes indicate that the patient has had a history of generalized tonic-clonic seizures as well as staring spells. She had been prescribed Keppra and Depakote however there seems to be a history of noncompliance with medication. Most recently on 04/27/2016 neurology recommended continuation of Depakote and discontinuation of Keppra secondary to the possibility that [place] may be contributing to psychiatric conditions.  PAST MEDICAL HISTORY: Past Medical History:  Diagnosis Date  . Asthma   . H/O ETOH abuse   . Seizure ([place])       PAST SURGICAL HISTORY: Past Surgical History:  Procedure Laterality Date  . ABDOMINAL HYSTERECTOMY    . GALLBLADDER SURGERY        SOCIAL HISTORY: Social History   Substance Use Topics  . Smoking status: Current Some Day Smoker    Types: Cigarettes  . Smokeless tobacco: Never Used  . Alcohol use No     Comment: Hx of alchol abuse, not currently drinking      FAMILY HISTORY: Family History  Problem Relation Age of Onset  . Seizures Mother   . Alcohol abuse Mother   . Heart attack Father   . Drug abuse Brother   . Cancer Brother     Pancreatic     MEDICATIONS AT HOME: Prior to Admission medications   Medication Sig Start Date End Date Taking? Authorizing Provider  divalproex (DEPAKOTE) 500 MG DR tablet Take 1 tablet (500 mg total) by mouth 2 (two) times daily. 04/27/16  Yes Kathrynn Ducking, MD  acetaminophen (TYLENOL) 500 MG tablet Take 500 mg by mouth every 8 (eight) hours as needed.    Historical Provider, MD  acetaminophen-codeine (TYLENOL #3) 300-30 MG tablet Take by mouth every 6 (six) hours as needed for moderate pain.    Historical Provider, MD  albuterol (PROAIR HFA) 108 (90 Base) MCG/ACT inhaler Inhale 2 puffs into the lungs every 6 (six) hours as needed for wheezing or shortness of breath.    Historical Provider, MD  cholecalciferol (VITAMIN D) 1000 units tablet Take 1,000 Units by mouth daily.    Historical Provider, MD  folic acid (FOLVITE) 1 MG tablet Take 1 mg by mouth daily.    Historical Provider, MD  LORazepam (ATIVAN) 1 MG tablet Take 1 mg by mouth every 8 (eight) hours as needed for anxiety.    Historical Provider, MD  magnesium oxide (MAG-OX) 400 MG tablet Take 400 mg by mouth 3 (three) times daily.  Historical Provider, MD  Multiple Vitamin (MULTIVITAMIN) tablet Take 1 tablet by mouth daily.    Historical Provider, MD  pantoprazole (PROTONIX) 40 MG tablet Take 1 tablet (40 mg total) by mouth 2 (two) times daily. Patient not taking: Reported on 06/24/2016 08/19/15   Reyne Dumas, MD  thiamine 100 MG tablet Take 100 mg by mouth daily.    Historical Provider, MD      DRUG ALLERGIES: Allergies  Allergen Reactions  .  Kiwi Extract Hives and Itching  . Morphine And Related Hives  . Penicillins Hives and Itching    Has patient had a PCN reaction causing immediate rash, facial/tongue/throat swelling, SOB or lightheadedness with hypotension: Yes Has patient had a PCN reaction causing severe rash involving mucus membranes or skin necrosis: No Has patient had a PCN reaction that required hospitalization No Has patient had a PCN reaction occurring within the last 10 years: No If all of the above answers are "NO", then may proceed with Cephalosporin use.  . Strawberry Extract Hives and Itching     REVIEW OF SYSTEMS: Unable to assess secondary to patient's altered mental status  PHYSICAL EXAMINATION: VITAL SIGNS: Blood pressure 139/89, pulse 83, temperature 99.8 F (37.7 C), temperature source Axillary, resp. rate 19, height 5\' 7"  (1.702 m), weight 72.6 kg (160 lb), SpO2 98 %.  GENERAL: 60 y.o.-year-old black female patient, side lying. Resting but arousable. Cannot follow commands secondary to sedation HEENT: Head normocephalic. Pupils equal, round, reactive to light and accommodation. No scleral icterus. Extraocular muscles intact. Nares are patent. Oropharynx is clear. Mucus membranes moist. There is some dried blood on her lips NECK: Supple, full range of motion. No JVD, no bruit heard. No thyroid enlargement, no tenderness, no cervical lymphadenopathy. CHEST: Normal breath sounds bilaterally. No wheezing, rales, rhonchi or crackles. No use of accessory muscles of respiration.  No reproducible chest wall tenderness.  CARDIOVASCULAR: S1, S2 normal. No murmurs, rubs, or gallops. Cap refill <2 seconds. ABDOMEN: Soft, nontender, nondistended. No rebound, guarding, rigidity. Normoactive bowel sounds present in all four quadrants. No organomegaly or mass. EXTREMITIES: No pedal edema, cyanosis, or clubbing. NEUROLOGIC: Unable to assess secondary to patient cannot follow commands PSYCHIATRIC: Unable to assess   SKIN: Warm dry and intact   LABORATORY PANEL:  CBC  Recent Labs Lab 06/24/16 1832  WBC 14.1*  HGB 14.1  HCT 41.8  PLT 361   ----------------------------------------------------------------------------------------------------------------- Chemistries  Recent Labs Lab 06/24/16 1832  NA 138  K 4.0  CL 105  CO2 26  GLUCOSE 105*  BUN 12  CREATININE 0.81  CALCIUM 9.3  AST 25  ALT 11*  ALKPHOS 85  BILITOT 1.2   ------------------------------------------------------------------------------------------------------------------ Cardiac Enzymes  Recent Labs Lab 06/24/16 1832  TROPONINI <0.03   ------------------------------------------------------------------------------------------------------------------  RADIOLOGY: Ct Head Wo Contrast  Result Date: 06/24/2016 CLINICAL DATA:  Acute onset of seizure like activity. Status post fall, with confusion. Initial encounter. EXAM: CT HEAD WITHOUT CONTRAST TECHNIQUE: Contiguous axial images were obtained from the base of the skull through the vertex without intravenous contrast. COMPARISON:  CT of the head performed 10/10/2015 FINDINGS: Brain: No evidence of acute infarction, hemorrhage, hydrocephalus, extra-axial collection or mass lesion/mass effect. Prominence of the ventricles and sulci reflects mild cortical volume loss. Mild periventricular head subcortical white matter change likely reflects small vessel ischemic microangiopathy. The brainstem and fourth ventricle are within normal limits. The basal ganglia are unremarkable in appearance. The cerebral hemispheres demonstrate grossly normal gray-white differentiation. No mass effect or midline shift is seen.  All Vascular: No hyperdense vessel or unexpected calcification. Skull: There is no evidence of fracture; visualized osseous structures are unremarkable in appearance. Sinuses/Orbits: The visualized portions of the orbits are within normal limits. The paranasal sinuses and mastoid  air cells are well-aerated. Other: No significant soft tissue abnormalities are seen. IMPRESSION: 1. No acute intracranial pathology seen on CT. 2. Mild cortical volume loss and scattered small vessel ischemic microangiopathy. Electronically Signed   By: Garald Balding M.D.   On: 06/24/2016 20:18    EKG:  sinus tachycardia at 125 bpm with normal axis and nonspecific ST and T wave changes.  IMPRESSION AND PLAN:  This is a 60 y.o. female with a history of Asthma, alcohol and cocaine abuse, seizure disorder now being admitted with: 1. Seizure, alcohol withdrawal versus medication noncompliance-admit to inpatient with telemetry monitoring, seizure precautions. Patient has received IV Keppra and Depakote as well as Haldol and Ativan in the emergency department. Per recent neurology notes we will continue Depakote IV every 12 hours as well as Ativan when necessary.CIWA protocol for signs and symptoms of alcohol withdrawal. [place]. IV normal saline with banana bag. Consider neurology consult. 2. History of asthma-we'll continue albuterol nebulizer when necessary shortness of breath. 3. History of GERD-continue Protonix    Diet/Nutrition:  nothing by mouth for now FluidIV normal saline with banana bag  DVT Px: Lovenox, SCDs and early ambulation when possible  Code Status: Full  All the records are reviewed and case discussed with ED provider. Management plans discussed with the patient and/or family who express understanding and agree with plan of care.   TOTAL TIME TAKING CARE OF THIS PATIENT: 60 minutes.   Misheel Gowans D.O. on 06/25/2016 at 12:42 AM Between 7am to 6pm - Pager - [phone] After 6pm go to [email or web address] - [employer] [place] [facility] Office [phone] CC: Primary care physician; PROVIDER NOT IN SYSTEM     Note: This dictation was prepared with Dragon dictation along with smaller phrase technology. Any transcriptional  errors that result from this process are unintentional.

## 2016-06-25 NOTE — ED Notes (Signed)
Report called to floor nurse, Harriette Bouillon.  Pt more awake now.  Follows verbal commands.  Iv in place.  Sitter with pt.

## 2016-06-25 NOTE — Progress Notes (Addendum)
[place] [facility] [place] NAME: Lindsey [name]    MR#:  [id number]  DATE OF BIRTH:  [birth year]  SUBJECTIVE:  CHIEF COMPLAINT:   Chief Complaint  Patient presents with  . Seizures   The patient is 60 year old African-American female with past medical history significant with history of alcohol abuse, asthma, seizure disorder, who presents to the hospital with complaints of altered mental status. Apparently EMS found her lying on the floor naked. She was brought to emergency room where she received Keppra, Depakote, Haldol and Ativan. The patient was admitted for evaluation and treatment. She was initiated on valproic acid. She feels relatively good today, denies any pain. She wants to eat, although falls asleep intermittently Review of Systems  Unable to perform ROS: Mental acuity    VITAL SIGNS: Blood pressure (!) 147/74, pulse 80, temperature 97.9 F (36.6 C), temperature source Oral, resp. rate 18, height 5\' 7"  (1.702 m), weight 61.7 kg (136 lb 1.6 oz), SpO2 98 %.  PHYSICAL EXAMINATION:   GENERAL:  60 y.o.-year-old patient lying in the bed with no acute distress. Able to open her eyes and converses briefly, but then falls to sleep EYES: Pupils equal, round, reactive to light and accommodation. No scleral icterus. Extraocular muscles intact.  HEENT: Head atraumatic, normocephalic. Oropharynx and nasopharynx clear.  NECK:  Supple, no jugular venous distention. No thyroid enlargement, no tenderness.  LUNGS: Markedly diminished breath sounds bilaterally, no wheezing, rales,rhonchi or crepitation. No use of accessory muscles of respiration.  CARDIOVASCULAR: S1, S2 normal. No murmurs, rubs, or gallops.  ABDOMEN: Soft, nontender, nondistended. Bowel sounds present. No organomegaly or mass.  EXTREMITIES: No pedal edema, cyanosis, or clubbing.  NEUROLOGIC: Cranial nerves II through XII are intact. Muscle strength 5/5 in all extremities. Sensation  intact. Gait not checked.  PSYCHIATRIC: The patient is alert , converses briefly, but then drifts down to sleep  SKIN: No obvious rash, lesion, or ulcer.   ORDERS/RESULTS REVIEWED:   CBC  Recent Labs Lab 06/24/16 1832 06/25/16 0552  WBC 14.1* 10.3  HGB 14.1 12.5  HCT 41.8 36.0  PLT 361 294  MCV 88.9 87.9  MCH 29.9 30.6  MCHC 33.6 34.7  RDW 13.8 13.9  LYMPHSABS 1.3  --   MONOABS 1.2*  --   EOSABS 0.0  --   BASOSABS 0.0  --    ------------------------------------------------------------------------------------------------------------------  Chemistries   Recent Labs Lab 06/24/16 1832 06/25/16 0552  NA 138 140  K 4.0 3.5  CL 105 110  CO2 26 23  GLUCOSE 105* 87  BUN 12 10  CREATININE 0.81 0.65  CALCIUM 9.3 8.8*  MG  --  1.7  AST 25  --   ALT 11*  --   ALKPHOS 85  --   BILITOT 1.2  --    ------------------------------------------------------------------------------------------------------------------ estimated creatinine clearance is 73.6 mL/min (by C-G formula based on SCr of 0.65 mg/dL). ------------------------------------------------------------------------------------------------------------------ No results for input(s): TSH, T4TOTAL, T3FREE, THYROIDAB in the last 72 hours.  Invalid input(s): FREET3  Cardiac Enzymes  Recent Labs Lab 06/24/16 1832  TROPONINI <0.03   ------------------------------------------------------------------------------------------------------------------ Invalid input(s): POCBNP ---------------------------------------------------------------------------------------------------------------  RADIOLOGY: Ct Head Wo Contrast  Result Date: 06/24/2016 CLINICAL DATA:  Acute onset of seizure like activity. Status post fall, with confusion. Initial encounter. EXAM: CT HEAD WITHOUT CONTRAST TECHNIQUE: Contiguous axial images were obtained from the base of the skull through the vertex without intravenous contrast. COMPARISON:  CT of the  head performed 10/10/2015 FINDINGS: Brain: No evidence  of acute infarction, hemorrhage, hydrocephalus, extra-axial collection or mass lesion/mass effect. Prominence of the ventricles and sulci reflects mild cortical volume loss. Mild periventricular head subcortical white matter change likely reflects small vessel ischemic microangiopathy. The brainstem and fourth ventricle are within normal limits. The basal ganglia are unremarkable in appearance. The cerebral hemispheres demonstrate grossly normal gray-white differentiation. No mass effect or midline shift is seen. All Vascular: No hyperdense vessel or unexpected calcification. Skull: There is no evidence of fracture; visualized osseous structures are unremarkable in appearance. Sinuses/Orbits: The visualized portions of the orbits are within normal limits. The paranasal sinuses and mastoid air cells are well-aerated. Other: No significant soft tissue abnormalities are seen. IMPRESSION: 1. No acute intracranial pathology seen on CT. 2. Mild cortical volume loss and scattered small vessel ischemic microangiopathy. Electronically Signed   By: Garald Balding M.D.   On: 06/24/2016 20:18    EKG:  Orders placed or performed during the hospital encounter of 06/24/16  . ED EKG  . ED EKG  . EKG 12-Lead  . EKG 12-Lead    ASSESSMENT AND PLAN:  Active Problems:   Alcohol withdrawal seizure ([place]) #1 alcohol withdrawal seizure, valproic acid level was low,,   continue patient on Depakote intravenously, Ativan, following her clinically. CT of head reveals scattered small vessel ischemic microangiopathy, no acute intracranial pathology.  #2Leukocytosis, resolved, no obvious infection #3. Hyperglycemia, resolved. Fasting blood glucose level was 87, no diabetes   #4. Essential hypertension, initiate Norvasc #5. Tobacco abuse. Counseling, discussed this patient for 3 minutes, nicotine replacement therapy will be initiated, patient is agreeable  Management plans  discussed with the patient, family and they are in agreement.   DRUG ALLERGIES:  Allergies  Allergen Reactions  . Kiwi Extract Hives and Itching  . Morphine And Related Hives  . Penicillins Hives and Itching    Has patient had a PCN reaction causing immediate rash, facial/tongue/throat swelling, SOB or lightheadedness with hypotension: Yes Has patient had a PCN reaction causing severe rash involving mucus membranes or skin necrosis: No Has patient had a PCN reaction that required hospitalization No Has patient had a PCN reaction occurring within the last 10 years: No If all of the above answers are "NO", then may proceed with Cephalosporin use.  . Strawberry Extract Hives and Itching    CODE STATUS:     Code Status Orders        Start     Ordered   06/25/16 0300  Full code  Continuous     06/25/16 0259    Code Status History    Date Active Date Inactive Code Status Order ID Comments User Context   11/02/2015  7:32 PM 11/06/2015  6:12 PM Full Code [id number]  Greta Doom, MD Inpatient   10/10/2015  8:59 PM 10/13/2015  7:18 PM Full Code [id number]  Reubin Milan, MD Inpatient   08/15/2015  3:25 PM 08/19/2015  7:30 PM Full Code [id number]  [name], MD ED   03/01/2015  8:05 PM 03/08/2015  6:11 PM Full Code [id number]  Etta Quill, DO ED   02/06/2015  6:03 AM 02/07/2015  6:47 PM Full Code [id number]  Lavina Hamman, MD ED   01/18/2015 11:43 AM 01/19/2015  5:33 PM Full Code [id number]  Orson Eva, MD Inpatient   08/16/2014  5:00 PM 08/18/2014  6:15 PM Full Code [id number]  Varney Biles, MD ED   03/04/2014  7:40 PM 03/06/2014  3:28 PM Full Code [id number]  Clifton James  Dyann Kief, MD Inpatient   11/06/2013  1:46 AM 11/09/2013  1:55 PM Full Code [id number]  Berle Mull, MD Inpatient      TOTAL TIME TAKING CARE OF THIS PATIENT: 35 minutes.    Theodoro Grist M.D on 06/25/2016 at 3:43 PM  Between 7am to 6pm - Pager - [phone]  After 6pm go to [email or web address] - password [email or web address]  [facility]  Office  [phone]  CC: Primary care physician; PROVIDER NOT IN SYSTEM

## 2016-06-25 NOTE — ED Notes (Signed)
Sitter with pt

## 2016-06-25 NOTE — Clinical Social Work Note (Signed)
Clinical Social Work Assessment  Patient Details  Name: Lindsey [name] MRN: [id number] Date of Birth: January 16, 1956  Date of referral:  06/25/16               Reason for consult:  Substance Use/ETOH Abuse                Permission sought to share information with:    Permission granted to share information::     Name::        Agency::     Relationship::     Contact Information:     Housing/Transportation Living arrangements for the past 2 months:  Single Family Home Source of Information:  Patient Patient Interpreter Needed:  None Criminal Activity/Legal Involvement Pertinent to Current Situation/Hospitalization:  No - Comment as needed Significant Relationships:  Adult Children Lives with:  Adult Children Do you feel safe going back to the place where you live?  Yes Need for family participation in patient care:  Yes (Comment)  Care giving concerns:  Patient lives with her daughter Lindsey [name] in [place].    Social Worker assessment / plan:  Holiday representative (CSW) received substance abuse consult. CSW met with patient at bedside to address consult. Patient had a sitter at bedside. Patient was lethargic however she did wake up and answer questions appropriately. CSW introduced self and explained role of CSW department. Patient reported that she lives with her daughter and has 3 adult children. Patient reported that her daughter Lindsey [name] is a twin. Patient reported that she is independent with her ADL's and her daughter drives her places. Per patient her daughter drives in case she has a seizure. Patient reported that she has a past history of marijuana use and has not used since December 2016. When CSW asked if patient drinks alcohol patient laughed and reported that she drinks sometimes. Patient reported that she does not drink every day. Patient would not give CSW a specific amount of alcohol she drinks weekly. CSW asked if patient saw a counselor and she said ask my  daughter. Patient fell asleep. CSW will continue to follow and assist as needed.   Employment status:  Disabled (Comment on whether or not currently receiving Disability) Insurance information:  Medicaid In [place] PT Recommendations:  Not assessed at this time Information / Referral to community resources:  Outpatient Substance Abuse Treatment Options  Patient/Family's Response to care: Patient was lethargic and fell asleep during assessment.   Patient/Family's Understanding of and Emotional Response to Diagnosis, Current Treatment, and Prognosis:  Patient laughed when CSW asked if she drinks alcohol.   Emotional Assessment Appearance:  Appears older than stated age Attitude/Demeanor/Rapport:  Lethargic Affect (typically observed):  Appropriate, Pleasant Orientation:  Oriented to Self, Oriented to Place, Oriented to  Time, Fluctuating Orientation (Suspected and/or reported Sundowners) Alcohol / Substance use:  Alcohol Use, Illicit Drugs (Past history of marijuana use ) Psych involvement (Current and /or in the community):  No (Comment)  Discharge Needs  Concerns to be addressed:  Discharge Planning Concerns Readmission within the last 30 days:  No Current discharge risk:  Substance Abuse Barriers to Discharge:  Continued Medical Work up   [employer], Veronia Beets, LCSW 06/25/2016, 12:45 PM

## 2016-06-26 DIAGNOSIS — R569 Unspecified convulsions: Secondary | ICD-10-CM

## 2016-06-26 DIAGNOSIS — Z720 Tobacco use: Secondary | ICD-10-CM

## 2016-06-26 DIAGNOSIS — Z9119 Patient's noncompliance with other medical treatment and regimen: Secondary | ICD-10-CM

## 2016-06-26 DIAGNOSIS — R739 Hyperglycemia, unspecified: Secondary | ICD-10-CM

## 2016-06-26 DIAGNOSIS — Z91199 Patient's noncompliance with other medical treatment and regimen due to unspecified reason: Secondary | ICD-10-CM

## 2016-06-26 DIAGNOSIS — D72829 Elevated white blood cell count, unspecified: Secondary | ICD-10-CM

## 2016-06-26 LAB — VALPROIC ACID LEVEL: Valproic Acid Lvl: 42 ug/mL — ABNORMAL LOW (ref 50.0–100.0)

## 2016-06-26 MED ORDER — THIAMINE HCL 100 MG PO TABS: 300.0000 mg | ORAL_TABLET | Freq: Every day | ORAL | 0 refills | Status: DC

## 2016-06-26 MED ORDER — INFLUENZA VAC SPLIT QUAD 0.5 ML IM SUSY: 0.5000 mL | PREFILLED_SYRINGE | INTRAMUSCULAR | Status: DC

## 2016-06-26 MED ORDER — PNEUMOCOCCAL VAC POLYVALENT 25 MCG/0.5ML IJ INJ: 0.5000 mL | INJECTION | INTRAMUSCULAR | Status: DC

## 2016-06-26 MED ORDER — AMLODIPINE BESYLATE 5 MG PO TABS: 5.0000 mg | ORAL_TABLET | Freq: Every day | ORAL | 5 refills | Status: DC

## 2016-06-26 MED ORDER — DIVALPROEX SODIUM 500 MG PO DR TAB: 500.0000 mg | DELAYED_RELEASE_TABLET | Freq: Two times a day (BID) | ORAL | 6 refills | Status: DC

## 2016-06-26 MED ORDER — NICOTINE 21 MG/24HR TD PT24: 21.0000 mg | MEDICATED_PATCH | Freq: Every day | TRANSDERMAL | 0 refills | Status: DC

## 2016-06-26 NOTE — Evaluation (Signed)
Physical Therapy Evaluation Patient Details Name: Lindsey [name] MRN: [id number] DOB: 1955/12/27 Today's Date: 06/26/2016   History of Present Illness  60 yo female with onset of confusion and EtOH withdrawal symptoms along with medicine non compliance was admitted to hospital with elevated blood sugar, leukocytosis, seizures, and PMHx:  EtOH abuse, psych admission 2017,   Clinical Impression  Pt is demonstrating significantly unsafe mobility which has likely preceded the admission to hospital.  Her plan is to get up to walk with PT and transition to home with daughter assisting her at all times with standing tasks.  Pt has ST memory changes that cause her to require dense repetition of instructions and so expect her daughter at some point to need ALF care or another person to manage this pt.   Follow acutely for balance and safety instructions with standing and mobility of walking especially.    Follow Up Recommendations Home health PT;Supervision for mobility/OOB    Equipment Recommendations  Rolling walker with 5" wheels    Recommendations for Other Services Rehab consult     Precautions / Restrictions Precautions Precautions: Fall (telemetry) Restrictions Weight Bearing Restrictions: No      Mobility  Bed Mobility               General bed mobility comments: up when PT entered  Transfers Overall transfer level: Needs assistance Equipment used: Rolling walker (2 wheeled);1 person hand held assist Transfers: Sit to/from [facility] Sit to Stand: Min assist Stand pivot transfers: Min assist       General transfer comment: reminders for using hand placement  Ambulation/Gait Ambulation/Gait assistance: Min assist Ambulation Distance (Feet): 150 Feet Assistive device: Rolling walker (2 wheeled);1 person hand held assist Gait Pattern/deviations: Step-through pattern;Wide base of support;Drifts right/left;Shuffle;Ataxic;Decreased stride length Gait  velocity: variable Gait velocity interpretation: Below normal speed for age/gender General Gait Details: unsafe coordination of mobility with her RW, needs continual cues for direction of walker and safety in its use, follows instructions with repetition and tactile cues  Stairs            Wheelchair Mobility    Modified Rankin (Stroke Patients Only)       Balance Overall balance assessment: Needs assistance Sitting-balance support: Feet supported Sitting balance-Leahy Scale: Good     Standing balance support: Bilateral upper extremity supported Standing balance-Leahy Scale: Fair                               Pertinent Vitals/Pain Pain Assessment: No/denies pain    Home Living Family/patient expects to be discharged to:: Private residence Living Arrangements: Children Available Help at Discharge: Family Type of Home: Apartment Home Access: Elevator     Home Layout: One level Home Equipment: Kasandra Knudsen - single point Additional Comments: Pt states her daughter is a cancer patient    Prior Function                 Hand Dominance        Extremity/Trunk Assessment   Upper Extremity Assessment: Overall WFL for tasks assessed           Lower Extremity Assessment: Difficult to assess due to impaired cognition      Cervical / Trunk Assessment: Normal  Communication      Cognition Arousal/Alertness: Awake/alert Behavior During Therapy: Impulsive Overall Cognitive Status: History of cognitive impairments - at baseline       Memory: Decreased recall of precautions;Decreased  short-term memory              General Comments      Exercises     Assessment/Plan    PT Assessment Patient needs continued PT services  PT Problem List Decreased activity tolerance;Decreased balance;Decreased mobility;Decreased coordination;Decreased cognition;Decreased knowledge of use of DME;Decreased safety awareness;Decreased knowledge of  precautions;Cardiopulmonary status limiting activity          PT Treatment Interventions DME instruction;Gait training;Functional mobility training;Therapeutic activities;Therapeutic exercise;Balance training;Neuromuscular re-education;Patient/family education    PT Goals (Current goals can be found in the Care Plan section)  Acute Rehab PT Goals Patient Stated Goal: to walk and go home PT Goal Formulation: With patient Time For Goal Achievement: 07/10/16 Potential to Achieve Goals: Good    Frequency Min 2X/week   Barriers to discharge   daughter is home with pt    Co-evaluation               End of Session Equipment Utilized During Treatment: Gait belt Activity Tolerance: Patient tolerated treatment well;Other (comment) (poor safety awareness and had to control with instructions) Patient left: in chair;with call bell/phone within reach Nurse Communication: Mobility status         Time: 1441-1515 PT Time Calculation (min) (ACUTE ONLY): 34 min   Charges:   PT Evaluation $PT Eval Moderate Complexity: 1 Procedure PT Treatments $Gait Training: 8-22 mins   PT G Codes:        Ramond Dial 07/13/2016, 4:11 PM    Mee Hives, PT MS Acute Rehab Dept. Number: [place] [facility] [place]

## 2016-06-26 NOTE — Discharge Summary (Signed)
[facility] [place] NAME: Lindsey [name]    MR#:  [id number]  DATE OF BIRTH:  05-18-1956  DATE OF ADMISSION:  06/24/2016 ADMITTING PHYSICIAN: Ubaldo Glassing Hugelmeyer, DO  DATE OF DISCHARGE: No discharge date for patient encounter.  PRIMARY CARE PHYSICIAN: PROVIDER NOT IN SYSTEM     ADMISSION DIAGNOSIS:  Seizure ([place]) [R56.9] H/O medication noncompliance [Z91.19] Alcohol use disorder ([place]) [F10.99]  DISCHARGE DIAGNOSIS:  Principal Problem:   Alcohol withdrawal seizure ([place]) Active Problems:   Seizures ([place])   Patient's noncompliance with other medical treatment and regimen   Tobacco abuse   Leukocytosis   Hyperglycemia   SECONDARY DIAGNOSIS:   Past Medical History:  Diagnosis Date  . Asthma   . H/O ETOH abuse   . Seizure ([place])     .pro HOSPITAL COURSE:   The patient is a 60 year old African-American female with history of seizures, alcohol, tobacco abuse, asthma, who presents to the hospital with complaints of altered mental status. Apparently, patient was found on the floor naked, on arrival to emergency room, she received Keppra, Depakote, Haldol and Ativan, was admitted for evaluation and treatment due to concerns of a seizure episode. Her valproic acid level came back low at less than 10 on admission to the hospital, we discussed this with the patient and she admitted that she would not take her medications because of financial constraints . We contacted care management, who recommended to patient to borrow money from someone, if needed,  to buy medications, stop drinking alcohol and using tobacco. Patient's valproic acid level improved with therapy, CIWA scale was initiated while she was in the hospital and remained at 0. Other labs/studies: Urine drug screen was negative, MRSA PCR was negative, CT of head without contrast was unremarkable, UA was normal .   Discussion by problem: #1 seizure due to medical noncompliance,  not taking medications according to recommendations, as valproic acid level was low, below 10 on admission,    patient was recommended to continue Depakote as prescribed at home, prescription was given upon discharge, she is to continue  Ativan as per prior recommendations. The patient clinically improved and she is ready to be discharged home after she is seen by physical therapist, unless skilled nursing facility is recommended. CT of head revealed scattered small vessel ischemic microangiopathy, no acute intracranial pathology.  #2Leukocytosis, resolved, no obvious infection #3. Hyperglycemia, resolved. Fasting blood glucose level was 87, no diabetes   #4. Essential hypertension, initiated Norvasc, blood pressure has improved, it is recommended to follow patient's blood pressure readings closely and discontinue medications if not needed #5. Tobacco abuse counseling, discussed this patient for 3 minutes, nicotine replacement therapy was recommended  DISCHARGE CONDITIONS:   Stable   CONSULTS OBTAINED:    DRUG ALLERGIES:   Allergies  Allergen Reactions  . Kiwi Extract Hives and Itching  . Morphine And Related Hives  . Penicillins Hives and Itching    Has patient had a PCN reaction causing immediate rash, facial/tongue/throat swelling, SOB or lightheadedness with hypotension: Yes Has patient had a PCN reaction causing severe rash involving mucus membranes or skin necrosis: No Has patient had a PCN reaction that required hospitalization No Has patient had a PCN reaction occurring within the last 10 years: No If all of the above answers are "NO", then may proceed with Cephalosporin use.  Marland Kitchen Strawberry Extract Hives and Itching    DISCHARGE MEDICATIONS:   Current Discharge Medication List    START  taking these medications   Details  amLODipine (NORVASC) 5 MG tablet Take 1 tablet (5 mg total) by mouth daily. Qty: 30 tablet, Refills: 5    nicotine (NICODERM CQ - DOSED IN MG/24 HOURS) 21  mg/24hr patch Place 1 patch (21 mg total) onto the skin daily. Qty: 28 patch, Refills: 0      CONTINUE these medications which have CHANGED   Details  divalproex (DEPAKOTE) 500 MG DR tablet Take 1 tablet (500 mg total) by mouth 2 (two) times daily. Qty: 60 tablet, Refills: 6    thiamine 100 MG tablet Take 3 tablets (300 mg total) by mouth daily. Qty: 30 tablet, Refills: 0      CONTINUE these medications which have NOT CHANGED   Details  acetaminophen (TYLENOL) 500 MG tablet Take 500 mg by mouth every 8 (eight) hours as needed.    acetaminophen-codeine (TYLENOL #3) 300-30 MG tablet Take by mouth every 6 (six) hours as needed for moderate pain.    albuterol (PROAIR HFA) 108 (90 Base) MCG/ACT inhaler Inhale 2 puffs into the lungs every 6 (six) hours as needed for wheezing or shortness of breath.    cholecalciferol (VITAMIN D) 1000 units tablet Take 1,000 Units by mouth daily.    folic acid (FOLVITE) 1 MG tablet Take 1 mg by mouth daily.    LORazepam (ATIVAN) 1 MG tablet Take 1 mg by mouth every 8 (eight) hours as needed for anxiety.    magnesium oxide (MAG-OX) 400 MG tablet Take 400 mg by mouth 3 (three) times daily.    Multiple Vitamin (MULTIVITAMIN) tablet Take 1 tablet by mouth daily.    pantoprazole (PROTONIX) 40 MG tablet Take 1 tablet (40 mg total) by mouth 2 (two) times daily. Qty: 60 tablet, Refills: 1         DISCHARGE INSTRUCTIONS:    The patient is to follow-up with primary care physician as outpatient   If you experience worsening of your admission symptoms, develop shortness of breath, life threatening emergency, suicidal or homicidal thoughts you must seek medical attention immediately by calling 911 or calling your MD immediately  if symptoms less severe.  You Must read complete instructions/literature along with all the possible adverse reactions/side effects for all the Medicines you take and that have been prescribed to you. Take any new Medicines after you  have completely understood and accept all the possible adverse reactions/side effects.   Please note  You were cared for by a hospitalist during your hospital stay. If you have any questions about your discharge medications or the care you received while you were in the hospital after you are discharged, you can call the unit and asked to speak with the hospitalist on call if the hospitalist that took care of you is not available. Once you are discharged, your primary care physician will handle any further medical issues. Please note that NO REFILLS for any discharge medications will be authorized once you are discharged, as it is imperative that you return to your primary care physician (or establish a relationship with a primary care physician if you do not have one) for your aftercare needs so that they can reassess your need for medications and monitor your lab values.    Today   CHIEF COMPLAINT:   Chief Complaint  Patient presents with  . Seizures    HISTORY OF PRESENT ILLNESS:  Lindsey [name]  is a 60 y.o. female with a known history of seizures, alcohol, tobacco abuse, asthma, who presents  to the hospital with complaints of altered mental status. Apparently, patient was found on the floor naked, on arrival to emergency room, she received Keppra, Depakote, Haldol and Ativan, was admitted for evaluation and treatment due to concerns of a seizure episode. Her valproic acid level came back low at less than 10 on admission to the hospital, we discussed this with the patient and she admitted that she would not take her medications because of financial constraints . We contacted care management, who recommended to patient to borrow money from someone, if needed,  to buy medications, stop drinking alcohol and using tobacco. Patient's valproic acid level improved with therapy, CIWA scale was initiated while she was in the hospital and remained at 0. Other labs/studies: Urine drug screen was  negative, MRSA PCR was negative, CT of head without contrast was unremarkable, UA was normal .   Discussion by problem: #1 seizure due to medical noncompliance, not taking medications according to recommendations, as valproic acid level was low, below 10 on admission,    patient was recommended to continue Depakote as prescribed at home, prescription was given upon discharge, she is to continue  Ativan as per prior recommendations. The patient clinically improved and she is ready to be discharged home after she is seen by physical therapist, unless skilled nursing facility is recommended. CT of head revealed scattered small vessel ischemic microangiopathy, no acute intracranial pathology.  #2Leukocytosis, resolved, no obvious infection #3. Hyperglycemia, resolved. Fasting blood glucose level was 87, no diabetes   #4. Essential hypertension, initiated Norvasc, blood pressure has improved, it is recommended to follow patient's blood pressure readings closely and discontinue medications if not needed #5. Tobacco abuse counseling, discussed this patient for 3 minutes, nicotine replacement therapy was recommended     VITAL SIGNS:  Blood pressure (!) 123/58, pulse 77, temperature 98.2 F (36.8 C), temperature source Oral, resp. rate 16, height 5\' 7"  (1.702 m), weight 61.7 kg (136 lb 1.6 oz), SpO2 96 %.  I/O:   Intake/Output Summary (Last 24 hours) at 06/26/16 1332 Last data filed at 06/26/16 1309  Gross per 24 hour  Intake              480 ml  Output              950 ml  Net             -470 ml    PHYSICAL EXAMINATION:  GENERAL:  60 y.o.-year-old patient lying in the bed with no acute distress.  EYES: Pupils equal, round, reactive to light and accommodation. No scleral icterus. Extraocular muscles intact.  HEENT: Head atraumatic, normocephalic. Oropharynx and nasopharynx clear.  NECK:  Supple, no jugular venous distention. No thyroid enlargement, no tenderness.  LUNGS: Normal breath sounds  bilaterally, no wheezing, rales,rhonchi or crepitation. No use of accessory muscles of respiration.  CARDIOVASCULAR: S1, S2 normal. No murmurs, rubs, or gallops.  ABDOMEN: Soft, non-tender, non-distended. Bowel sounds present. No organomegaly or mass.  EXTREMITIES: No pedal edema, cyanosis, or clubbing.  NEUROLOGIC: Cranial nerves II through XII are intact. Muscle strength 5/5 in all extremities. Sensation intact. Gait not checked.  PSYCHIATRIC: The patient is alert and oriented x 3.  SKIN: No obvious rash, lesion, or ulcer.   DATA REVIEW:   CBC  Recent Labs Lab 06/25/16 0552  WBC 10.3  HGB 12.5  HCT 36.0  PLT 294    Chemistries   Recent Labs Lab 06/24/16 1832 06/25/16 0552  NA 138 140  K  4.0 3.5  CL 105 110  CO2 26 23  GLUCOSE 105* 87  BUN 12 10  CREATININE 0.81 0.65  CALCIUM 9.3 8.8*  MG  --  1.7  AST 25  --   ALT 11*  --   ALKPHOS 85  --   BILITOT 1.2  --     Cardiac Enzymes  Recent Labs Lab 06/24/16 1832  TROPONINI <0.03    Microbiology Results  Results for orders placed or performed during the hospital encounter of 06/24/16  MRSA PCR Screening     Status: None   Collection Time: 06/25/16  3:59 AM  Result Value Ref Range Status   MRSA by PCR NEGATIVE NEGATIVE Final    Comment:        The GeneXpert MRSA Assay (FDA approved for NASAL specimens only), is one component of a comprehensive MRSA colonization surveillance program. It is not intended to diagnose MRSA infection nor to guide or monitor treatment for MRSA infections.     RADIOLOGY:  Ct Head Wo Contrast  Result Date: 06/24/2016 CLINICAL DATA:  Acute onset of seizure like activity. Status post fall, with confusion. Initial encounter. EXAM: CT HEAD WITHOUT CONTRAST TECHNIQUE: Contiguous axial images were obtained from the base of the skull through the vertex without intravenous contrast. COMPARISON:  CT of the head performed 10/10/2015 FINDINGS: Brain: No evidence of acute infarction,  hemorrhage, hydrocephalus, extra-axial collection or mass lesion/mass effect. Prominence of the ventricles and sulci reflects mild cortical volume loss. Mild periventricular head subcortical white matter change likely reflects small vessel ischemic microangiopathy. The brainstem and fourth ventricle are within normal limits. The basal ganglia are unremarkable in appearance. The cerebral hemispheres demonstrate grossly normal gray-white differentiation. No mass effect or midline shift is seen. All Vascular: No hyperdense vessel or unexpected calcification. Skull: There is no evidence of fracture; visualized osseous structures are unremarkable in appearance. Sinuses/Orbits: The visualized portions of the orbits are within normal limits. The paranasal sinuses and mastoid air cells are well-aerated. Other: No significant soft tissue abnormalities are seen. IMPRESSION: 1. No acute intracranial pathology seen on CT. 2. Mild cortical volume loss and scattered small vessel ischemic microangiopathy. Electronically Signed   By: Garald Balding M.D.   On: 06/24/2016 20:18    EKG:   Orders placed or performed during the hospital encounter of 06/24/16  . ED EKG  . ED EKG  . EKG 12-Lead  . EKG 12-Lead      Management plans discussed with the patient, family and they are in agreement.  CODE STATUS:     Code Status Orders        Start     Ordered   06/25/16 0300  Full code  Continuous     06/25/16 0259    Code Status History    Date Active Date Inactive Code Status Order ID Comments User Context   11/02/2015  7:32 PM 11/06/2015  6:12 PM Full Code [id number]  Greta Doom, MD Inpatient   10/10/2015  8:59 PM 10/13/2015  7:18 PM Full Code [id number]  Reubin Milan, MD Inpatient   08/15/2015  3:25 PM 08/19/2015  7:30 PM Full Code [id number]  [name], MD ED   03/01/2015  8:05 PM 03/08/2015  6:11 PM Full Code [id number]  Etta Quill, DO ED   02/06/2015  6:03 AM 02/07/2015  6:47 PM Full Code  [id number]  Lavina Hamman, MD ED   01/18/2015 11:43 AM 01/19/2015  5:33 PM Full Code [id number]  Orson Eva, MD Inpatient   08/16/2014  5:00 PM 08/18/2014  6:15 PM Full Code [id number]  Varney Biles, MD ED   03/04/2014  7:40 PM 03/06/2014  3:28 PM Full Code [id number]  Barton Dubois, MD Inpatient   11/06/2013  1:46 AM 11/09/2013  1:55 PM Full Code [id number]  Berle Mull, MD Inpatient      TOTAL TIME TAKING CARE OF THIS PATIENT: 40 minutes.    Theodoro Grist M.D on 06/26/2016 at 1:32 PM  Between 7am to 6pm - Pager - [phone]  After 6pm go to [email or web address] - password [email or web address]  Office  [phone]  CC: Primary care physician; PROVIDER NOT IN SYSTEM

## 2016-06-26 NOTE — Progress Notes (Signed)
Waiting on walker to come then pt can go home

## 2016-06-26 NOTE — Care Management Note (Signed)
Case Management Note  Patient Details  Name: Deonni Nash MRN: [id number] Date of Birth: 11-26-1955  Subjective/Objective:       Ms Velo chose [facility] to be her home health provider. A Referral was faxed to [facility] requesting HH-PT and RN services. An order for a RW to be delivered to Ms Caire in room 251 was also faxed to [facility].              Action/Plan:   Expected Discharge Date:                  Expected Discharge Plan:     In-House Referral:     Discharge planning Services     Post Acute Care Choice:    Choice offered to:     DME Arranged:    DME Agency:     HH Arranged:    HH Agency:     Status of Service:     If discussed at [employer] of Stay Meetings, dates discussed:    Additional Comments:  Mayo Owczarzak A, RN 06/26/2016, 4:19 PM

## 2016-06-26 NOTE — Progress Notes (Signed)
Pt still here waiting on rolling walker

## 2016-06-28 ENCOUNTER — Ambulatory Visit: Payer: Medicaid Other | Admitting: Adult Health

## 2016-07-29 ENCOUNTER — Encounter: Payer: Self-pay | Admitting: Adult Health

## 2016-07-29 ENCOUNTER — Ambulatory Visit (INDEPENDENT_AMBULATORY_CARE_PROVIDER_SITE_OTHER): Payer: Medicaid Other | Admitting: Adult Health

## 2016-07-29 VITALS — BP 139/84 | HR 68 | Ht 67.0 in | Wt 144.8 lb

## 2016-07-29 DIAGNOSIS — R569 Unspecified convulsions: Secondary | ICD-10-CM

## 2016-07-29 DIAGNOSIS — F1910 Other psychoactive substance abuse, uncomplicated: Secondary | ICD-10-CM

## 2016-07-29 DIAGNOSIS — Z5181 Encounter for therapeutic drug level monitoring: Secondary | ICD-10-CM | POA: Diagnosis not present
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
## 2016-07-29 NOTE — Patient Instructions (Signed)
Continue Depakote and Keppra for now I will check drug levels and urine drug screen

## 2016-07-29 NOTE — Progress Notes (Signed)
I have read the note, and I agree with the clinical assessment and plan.  WILLIS,CHARLES KEITH   

## 2016-07-30 LAB — DRUG SCREEN, UR (12+OXYCODONE+CRT)
AMPHETAMINE SCREEN URINE: NEGATIVE ng/mL
BARBITURATE SCREEN URINE: NEGATIVE ng/mL
BENZODIAZEPINE SCREEN, URINE: NEGATIVE ng/mL
CANNABINOIDS UR QL SCN: NEGATIVE ng/mL
Cocaine (Metab) Scrn, Ur: NEGATIVE ng/mL
Creatinine(Crt), U: 254.4 mg/dL (ref 20.0–300.0)
Fentanyl, Urine: NEGATIVE pg/mL
METHADONE SCREEN, URINE: NEGATIVE ng/mL
Meperidine Screen, Urine: NEGATIVE ng/mL
OXYCODONE+OXYMORPHONE UR QL SCN: NEGATIVE ng/mL
Opiate Scrn, Ur: NEGATIVE ng/mL
PH UR, DRUG SCRN: 5.5 (ref 4.5–8.9)
PHENCYCLIDINE QUANTITATIVE URINE: NEGATIVE ng/mL
Propoxyphene Scrn, Ur: NEGATIVE ng/mL
SPECIFIC GRAVITY: 1.015
TRAMADOL SCREEN, URINE: NEGATIVE ng/mL

## 2016-08-02 ENCOUNTER — Telehealth: Payer: Self-pay

## 2016-08-02 NOTE — Telephone Encounter (Signed)
-----   Message from Ward [name], NP sent at 08/02/2016  7:33 AM EST ----- Drug screen is unremarkable. Lindsey [name] was unable to get blood when she was in the office. Please have the patient come back in for blood work. Remind patient to drink water throughout the day to stay hydrated.

## 2016-08-02 NOTE — Telephone Encounter (Signed)
I spoke to daughter, she is aware of results and recommendations. Patient is currently out of town right now but when she returns she will get labs done.

## 2016-09-26 ENCOUNTER — Emergency Department (HOSPITAL_COMMUNITY)
Admission: EM | Admit: 2016-09-26 | Discharge: 2016-09-27 | Disposition: A | Discharge status: Home / Self Care | Payer: Medicaid Other | Attending: Emergency Medicine | Admitting: Emergency Medicine

## 2016-09-26 DIAGNOSIS — F17210 Nicotine dependence, cigarettes, uncomplicated: Secondary | ICD-10-CM | POA: Insufficient documentation

## 2016-09-26 DIAGNOSIS — I10 Essential (primary) hypertension: Secondary | ICD-10-CM | POA: Insufficient documentation

## 2016-09-26 DIAGNOSIS — J45909 Unspecified asthma, uncomplicated: Secondary | ICD-10-CM | POA: Insufficient documentation

## 2016-09-26 DIAGNOSIS — Z79899 Other long term (current) drug therapy: Secondary | ICD-10-CM | POA: Insufficient documentation

## 2016-09-26 DIAGNOSIS — G40909 Epilepsy, unspecified, not intractable, without status epilepticus: Secondary | ICD-10-CM | POA: Diagnosis present

## 2016-09-26 DIAGNOSIS — R569 Unspecified convulsions: Secondary | ICD-10-CM

## 2016-09-26 LAB — CBC WITH DIFFERENTIAL/PLATELET
BASOS ABS: 0 10*3/uL (ref 0.0–0.1)
BASOS PCT: 0 %
EOS ABS: 0 10*3/uL (ref 0.0–0.7)
EOS PCT: 0 %
HCT: 38.2 % (ref 36.0–46.0)
Hemoglobin: 13.1 g/dL (ref 12.0–15.0)
Lymphocytes Relative: 16 %
Lymphs Abs: 1.8 10*3/uL (ref 0.7–4.0)
MCH: 30.1 pg (ref 26.0–34.0)
MCHC: 34.3 g/dL (ref 30.0–36.0)
MCV: 87.8 fL (ref 78.0–100.0)
MONO ABS: 0.6 10*3/uL (ref 0.1–1.0)
Monocytes Relative: 6 %
Neutro Abs: 8.7 10*3/uL — ABNORMAL HIGH (ref 1.7–7.7)
Neutrophils Relative %: 78 %
PLATELETS: 474 10*3/uL — AB (ref 150–400)
RBC: 4.35 MIL/uL (ref 3.87–5.11)
RDW: 14.3 % (ref 11.5–15.5)
WBC: 11.1 10*3/uL — ABNORMAL HIGH (ref 4.0–10.5)

## 2016-09-26 LAB — ETHANOL

## 2016-09-26 LAB — COMPREHENSIVE METABOLIC PANEL
ALBUMIN: 4.6 g/dL (ref 3.5–5.0)
ALT: 20 U/L (ref 14–54)
AST: 34 U/L (ref 15–41)
Alkaline Phosphatase: 108 U/L (ref 38–126)
Anion gap: 11 (ref 5–15)
BUN: 19 mg/dL (ref 6–20)
CHLORIDE: 104 mmol/L (ref 101–111)
CO2: 24 mmol/L (ref 22–32)
Calcium: 9.1 mg/dL (ref 8.9–10.3)
Creatinine, Ser: 0.82 mg/dL (ref 0.44–1.00)
GFR calc Af Amer: >60 mL/min (ref >60)
GFR calc non Af Amer: >60 mL/min (ref >60)
GLUCOSE: 66 mg/dL (ref 65–99)
POTASSIUM: 4 mmol/L (ref 3.5–5.1)
Sodium: 139 mmol/L (ref 135–145)
Total Bilirubin: 0.8 mg/dL (ref 0.3–1.2)
Total Protein: 7.7 g/dL (ref 6.5–8.1)

## 2016-09-26 MED ORDER — LEVETIRACETAM 500 MG/5ML IV SOLN
1000.0000 mg | Freq: Once | INTRAVENOUS | Status: AC
  Administered 2016-09-26: 1000 mg via INTRAVENOUS
  Filled 2016-09-26: qty 10

## 2016-09-26 MED ORDER — SODIUM CHLORIDE 0.9 % IV BOLUS (SEPSIS)
1000.0000 mL | Freq: Once | INTRAVENOUS | Status: AC
  Administered 2016-09-26: 1000 mL via INTRAVENOUS

## 2016-09-26 MED ORDER — DEXTROSE 5 % IV SOLN
500.0000 mg | Freq: Once | INTRAVENOUS | Status: AC
  Administered 2016-09-26: 500 mg via INTRAVENOUS
  Filled 2016-09-26: qty 5

## 2016-09-26 MED ORDER — DIVALPROEX SODIUM 500 MG PO DR TAB: 500.0000 mg | DELAYED_RELEASE_TABLET | Freq: Two times a day (BID) | ORAL | 0 refills | Status: DC

## 2016-09-26 MED ORDER — LEVETIRACETAM 750 MG PO TABS: 750.0000 mg | ORAL_TABLET | Freq: Two times a day (BID) | ORAL | 0 refills | Status: DC

## 2016-09-26 NOTE — ED Notes (Signed)
Attempted to call daughter, home phone is not working and cell # person states it is the wrong number

## 2016-09-26 NOTE — Discharge Instructions (Signed)
You need to take keppra and depakote as prescribed.   See your doctor  Return to ER if you have another seizure, vomiting, fevers.

## 2016-09-26 NOTE — ED Notes (Signed)
Ultrasound IV being placed at this time

## 2016-09-26 NOTE — ED Triage Notes (Signed)
Pt from home via EMS- family called and reports that pt has hx of dementia and seizures. Pt is non-compliant with meds. Pt family reports that pt had approx 3 seizures while sitting in a chair. There was no trauma and no incontinence noted. Pt sts that she wanted to be evaluated at hospital. Pt was not noted to be post-ictal on scene and has been A&O and in NAD

## 2016-09-26 NOTE — ED Provider Notes (Signed)
[facility] DEPT Provider Note   CSN: [id number] Arrival date & time: 09/26/16  1806     History   Chief Complaint Chief Complaint  Patient presents with  . Seizures    HPI Lindsey [name] is a 60 y.o. female hx of asthma, ETOH abuse, seizure, here with Another seizure. Patient states that she ran out of her Depakote, Keppra 2 days ago. She states that there was a party last night and she drinks some alcohol as well. She had witnessed seizure today by family. Per the family, patient has tonic-clonic seizures about 3 times with no return to baseline in the middle. She apparently woke up in the EMS truck and is back to baseline now.     The history is provided by the patient and the EMS personnel.    Past Medical History:  Diagnosis Date  . Asthma   . H/O ETOH abuse   . Seizure [facility])     Patient Active Problem List   Diagnosis Date Noted  . Seizures ([place]) 06/26/2016  . Patient's noncompliance with other medical treatment and regimen 06/26/2016  . Leukocytosis 06/26/2016  . Hyperglycemia 06/26/2016  . Tobacco abuse 06/26/2016  . Alcohol withdrawal seizure ([place]) 06/25/2016  . Psychosis 11/04/2015  . Acute respiratory failure with hypoxia ([place]) 11/03/2015  . Essential hypertension 11/03/2015  . 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 Asthma 10/10/2015  . Seizure disorder ([place]) 08/15/2015  . Status epilepticus ([place]) 08/15/2015  . Perinephric hematoma   . Head trauma   . Post-ictal state ([place]) 03/01/2015  . Seizure secondary to subtherapeutic anticonvulsant medication ([place]) 02/06/2015  . Recurrent seizures ([place]) 02/06/2015  . Acute encephalopathy   . Polysubstance abuse   . Alcohol dependence with withdrawal with complication ([place]) [id number]  . Protein-calorie malnutrition, severe ([place]) 03/04/2014  . Encephalopathy 03/04/2014  . Fever 03/04/2014  . Alcohol abuse 11/06/2013  . Alcohol withdrawal ([place]) 11/06/2013  . Marijuana abuse 11/06/2013  .  Seizure ([place]) 11/05/2013  . Altered mental status 11/05/2013    Past Surgical History:  Procedure Laterality Date  . ABDOMINAL HYSTERECTOMY    . GALLBLADDER SURGERY      OB History    No data available       Home Medications    Prior to Admission medications   Medication Sig Start Date End Date Taking? Authorizing Provider  acetaminophen (TYLENOL) 500 MG tablet Take 500 mg by mouth every 8 (eight) hours as needed.    Historical Provider, MD  acetaminophen-codeine (TYLENOL #3) 300-30 MG tablet Take by mouth every 6 (six) hours as needed for moderate pain.    Historical Provider, MD  albuterol (PROAIR HFA) 108 (90 Base) MCG/ACT inhaler Inhale 2 puffs into the lungs every 6 (six) hours as needed for wheezing or shortness of breath.    Historical Provider, MD  amLODipine (NORVASC) 5 MG tablet Take 1 tablet (5 mg total) by mouth daily. Patient not taking: Reported on 07/29/2016 06/27/16   Theodoro Grist, MD  cholecalciferol (VITAMIN D) 1000 units tablet Take 1,000 Units by mouth daily.    Historical Provider, MD  divalproex (DEPAKOTE) 500 MG DR tablet Take 1 tablet (500 mg total) by mouth 2 (two) times daily. 06/26/16   Theodoro Grist, MD  folic acid (FOLVITE) 1 MG tablet Take 1 mg by mouth daily.    Historical Provider, MD  levETIRAcetam (KEPPRA) 750 MG tablet Take 750 mg by mouth 2 (two) times daily.    Historical Provider, MD  LORazepam (ATIVAN) 1  MG tablet Take 1 mg by mouth every 8 (eight) hours as needed for anxiety.    Historical Provider, MD  magnesium oxide (MAG-OX) 400 MG tablet Take 400 mg by mouth 3 (three) times daily.    Historical Provider, MD  Multiple Vitamin (MULTIVITAMIN) tablet Take 1 tablet by mouth daily.    Historical Provider, MD  nicotine (NICODERM CQ - DOSED IN MG/24 HOURS) 21 mg/24hr patch Place 1 patch (21 mg total) onto the skin daily. Patient not taking: Reported on 07/29/2016 06/27/16   Theodoro Grist, MD  pantoprazole (PROTONIX) 40 MG tablet Take 1 tablet (40 mg  total) by mouth 2 (two) times daily. Patient not taking: Reported on 07/29/2016 08/19/15   Reyne Dumas, MD  thiamine 100 MG tablet Take 3 tablets (300 mg total) by mouth daily. Patient not taking: Reported on 07/29/2016 06/27/16   Theodoro Grist, MD    Family History Family History  Problem Relation Age of Onset  . Seizures Mother   . Alcohol abuse Mother   . Heart attack Father   . Drug abuse Brother   . Cancer Brother     Pancreatic    Social History Social History  Substance Use Topics  . Smoking status: Current Some Day Smoker    Types: Cigarettes  . Smokeless tobacco: Never Used  . Alcohol use No     Comment: Hx of alchol abuse, not currently drinking     Allergies   Kiwi extract; Morphine and related; Penicillins; and Strawberry extract   Review of Systems Review of Systems  Neurological: Positive for seizures.  All other systems reviewed and are negative.    Physical Exam Updated Vital Signs BP 149/78   Pulse 88   Temp 98.7 F (37.1 C)   Resp 17   SpO2 98%   Physical Exam  Constitutional: She is oriented to person, place, and time.  Chronically ill, demented   HENT:  Head: Normocephalic.  Mouth/Throat: Oropharynx is clear and moist.  Eyes: EOM are normal. Pupils are equal, round, and reactive to light.  Neck: Normal range of motion. Neck supple.  Cardiovascular: Normal rate, regular rhythm and normal heart sounds.   Pulmonary/Chest: Effort normal and breath sounds normal. No respiratory distress. She has no wheezes. She has no rales.  Abdominal: Soft. Bowel sounds are normal. She exhibits no distension. There is no tenderness. There is no guarding.  Musculoskeletal: Normal range of motion.  Neurological: She is alert and oriented to person, place, and time. She displays normal reflexes. No cranial nerve deficit. Coordination normal.  CN 2-12 intact. Nl strength throughout.   Skin: Skin is warm.  Psychiatric: She has a normal mood and affect.    Nursing note and vitals reviewed.    ED Treatments / Results  Labs (all labs ordered are listed, but only abnormal results are displayed) Labs Reviewed  CBC WITH DIFFERENTIAL/PLATELET - Abnormal; Notable for the following:       Result Value   WBC 11.1 (*)    Platelets 474 (*)    Neutro Abs 8.7 (*)    All other components within normal limits  COMPREHENSIVE METABOLIC PANEL  ETHANOL    EKG  EKG Interpretation  Date/Time:  Sunday September 26 2016 18:25:10 EST Ventricular Rate:  90 PR Interval:    QRS Duration: 88 QT Interval:  394 QTC Calculation: 483 R Axis:   51 Text Interpretation:  Sinus rhythm No significant change since last tracing Confirmed by YAO  MD, DAVID ([id number]) on  09/26/2016 6:50:29 PM       Radiology No results found.  Procedures Procedures (including critical care time)  Medications Ordered in ED Medications  sodium chloride 0.9 % bolus 1,000 mL (0 mLs Intravenous Stopped 09/26/16 2200)  levETIRAcetam (KEPPRA) 1,000 mg in sodium chloride 0.9 % 100 mL IVPB (0 mg Intravenous Stopped 09/26/16 1952)  valproate (DEPACON) 500 mg in dextrose 5 % 50 mL IVPB (0 mg Intravenous Stopped 09/26/16 2053)     Initial Impression / Assessment and Plan / ED Course  I have reviewed the triage vital signs and the nursing notes.  Pertinent labs & imaging results that were available during my care of the patient were reviewed by me and considered in my medical decision making (see chart for details).  Clinical Course     Lindsey [name] is a 60 y.o. female here with seizure. Patient has hx of seizure and has been off of her meds for 2 days and drank alcohol yesterday. Will check labs, load with IV keppra, depakote.   10:11 PM Labs and ETOH neg. Given depakote, keppra. No seizures in the ED. Will refill her meds.    Final Clinical Impressions(s) / ED Diagnoses   Final diagnoses:  None    New Prescriptions New Prescriptions   No medications on file      Drenda Freeze, MD 09/26/16 2212

## 2016-09-26 NOTE — ED Notes (Signed)
Attempted IV x 2. Unsuccessful. Natalie RN to attempt w/ [place].

## 2016-09-27 NOTE — ED Notes (Addendum)
Spoke to Audelia Acton (patient's daughter) states she is at home and will be at home with [facility] transports her.

## 2016-09-27 NOTE — ED Notes (Signed)
Unable to contact anyone to come get pt

## 2016-09-27 NOTE — ED Notes (Signed)
PT state she came in with a blue and red jacket. Info PT only belonging in room are pj pants, red shirt blue sweater

## 2016-09-27 NOTE — ED Notes (Signed)
[facility] at bedside 

## 2017-01-10 ENCOUNTER — Encounter (HOSPITAL_COMMUNITY): Payer: Self-pay | Admitting: Emergency Medicine

## 2017-01-10 ENCOUNTER — Emergency Department (HOSPITAL_COMMUNITY)
Admission: EM | Admit: 2017-01-10 | Discharge: 2017-01-10 | Disposition: A | Discharge status: Home / Self Care | Payer: Medicaid Other | Attending: Emergency Medicine | Admitting: Emergency Medicine

## 2017-01-10 DIAGNOSIS — F17210 Nicotine dependence, cigarettes, uncomplicated: Secondary | ICD-10-CM | POA: Diagnosis not present

## 2017-01-10 DIAGNOSIS — R079 Chest pain, unspecified: Secondary | ICD-10-CM

## 2017-01-10 DIAGNOSIS — R0789 Other chest pain: Secondary | ICD-10-CM | POA: Insufficient documentation

## 2017-01-10 DIAGNOSIS — J45909 Unspecified asthma, uncomplicated: Secondary | ICD-10-CM | POA: Insufficient documentation

## 2017-01-10 DIAGNOSIS — Z79899 Other long term (current) drug therapy: Secondary | ICD-10-CM | POA: Insufficient documentation

## 2017-01-10 MED ORDER — ACETAMINOPHEN 325 MG PO TABS
650.0000 mg | ORAL_TABLET | Freq: Once | ORAL | Status: AC
  Administered 2017-01-10: 650 mg via ORAL
  Filled 2017-01-10: qty 2
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
## 2017-01-25 ENCOUNTER — Ambulatory Visit: Payer: Medicaid Other | Admitting: Adult Health

## 2017-01-26 ENCOUNTER — Encounter: Payer: Self-pay | Admitting: Adult Health

## 2017-01-31 ENCOUNTER — Telehealth: Payer: Self-pay | Admitting: Adult Health
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
## 2017-02-22 ENCOUNTER — Ambulatory Visit: Payer: Medicaid Other | Admitting: Adult Health

## 2017-02-23 ENCOUNTER — Encounter: Payer: Self-pay | Admitting: Adult Health

## 2017-05-01 ENCOUNTER — Inpatient Hospital Stay (HOSPITAL_COMMUNITY)
Admission: EM | Admit: 2017-05-01 | Discharge: 2017-05-03 | DRG: 101 | Disposition: A | Discharge status: Home Health | Payer: Medicaid Other | Attending: Internal Medicine | Admitting: Internal Medicine

## 2017-05-01 ENCOUNTER — Emergency Department (HOSPITAL_COMMUNITY): Payer: Medicaid Other

## 2017-05-01 ENCOUNTER — Encounter (HOSPITAL_COMMUNITY): Payer: Self-pay

## 2017-05-01 DIAGNOSIS — G40909 Epilepsy, unspecified, not intractable, without status epilepticus: Secondary | ICD-10-CM | POA: Diagnosis not present

## 2017-05-01 DIAGNOSIS — R569 Unspecified convulsions: Secondary | ICD-10-CM | POA: Diagnosis not present

## 2017-05-01 DIAGNOSIS — D72829 Elevated white blood cell count, unspecified: Secondary | ICD-10-CM | POA: Diagnosis present

## 2017-05-01 DIAGNOSIS — K2970 Gastritis, unspecified, without bleeding: Secondary | ICD-10-CM | POA: Diagnosis present

## 2017-05-01 DIAGNOSIS — F13239 Sedative, hypnotic or anxiolytic dependence with withdrawal, unspecified: Secondary | ICD-10-CM | POA: Diagnosis present

## 2017-05-01 DIAGNOSIS — F1010 Alcohol abuse, uncomplicated: Secondary | ICD-10-CM | POA: Diagnosis not present

## 2017-05-01 DIAGNOSIS — Z91199 Patient's noncompliance with other medical treatment and regimen due to unspecified reason: Secondary | ICD-10-CM

## 2017-05-01 DIAGNOSIS — F10239 Alcohol dependence with withdrawal, unspecified: Secondary | ICD-10-CM | POA: Diagnosis present

## 2017-05-01 DIAGNOSIS — Z881 Allergy status to other antibiotic agents status: Secondary | ICD-10-CM

## 2017-05-01 DIAGNOSIS — I10 Essential (primary) hypertension: Secondary | ICD-10-CM | POA: Diagnosis present

## 2017-05-01 DIAGNOSIS — F1910 Other psychoactive substance abuse, uncomplicated: Secondary | ICD-10-CM | POA: Diagnosis not present

## 2017-05-01 DIAGNOSIS — R509 Fever, unspecified: Secondary | ICD-10-CM | POA: Diagnosis not present

## 2017-05-01 DIAGNOSIS — D473 Essential (hemorrhagic) thrombocythemia: Secondary | ICD-10-CM | POA: Diagnosis present

## 2017-05-01 DIAGNOSIS — J4520 Mild intermittent asthma, uncomplicated: Secondary | ICD-10-CM | POA: Diagnosis not present

## 2017-05-01 DIAGNOSIS — Z9119 Patient's noncompliance with other medical treatment and regimen: Secondary | ICD-10-CM

## 2017-05-01 DIAGNOSIS — Z79899 Other long term (current) drug therapy: Secondary | ICD-10-CM

## 2017-05-01 DIAGNOSIS — Z9114 Patient's other noncompliance with medication regimen: Secondary | ICD-10-CM

## 2017-05-01 DIAGNOSIS — J45909 Unspecified asthma, uncomplicated: Secondary | ICD-10-CM | POA: Diagnosis present

## 2017-05-01 DIAGNOSIS — Z87891 Personal history of nicotine dependence: Secondary | ICD-10-CM

## 2017-05-01 DIAGNOSIS — F1011 Alcohol abuse, in remission: Secondary | ICD-10-CM | POA: Diagnosis present

## 2017-05-01 DIAGNOSIS — Z9102 Food additives allergy status: Secondary | ICD-10-CM

## 2017-05-01 DIAGNOSIS — Z880 Allergy status to penicillin: Secondary | ICD-10-CM

## 2017-05-01 DIAGNOSIS — R4182 Altered mental status, unspecified: Secondary | ICD-10-CM

## 2017-05-01 LAB — COMPREHENSIVE METABOLIC PANEL
ALT: 16 U/L (ref 14–54)
ANION GAP: 11 (ref 5–15)
AST: 26 U/L (ref 15–41)
Albumin: 4.4 g/dL (ref 3.5–5.0)
Alkaline Phosphatase: 106 U/L (ref 38–126)
BUN: 17 mg/dL (ref 6–20)
CHLORIDE: 106 mmol/L (ref 101–111)
CO2: 27 mmol/L (ref 22–32)
Calcium: 9.6 mg/dL (ref 8.9–10.3)
Creatinine, Ser: 0.78 mg/dL (ref 0.44–1.00)
Glucose, Bld: 102 mg/dL — ABNORMAL HIGH (ref 65–99)
POTASSIUM: 4.7 mmol/L (ref 3.5–5.1)
SODIUM: 144 mmol/L (ref 135–145)
Total Bilirubin: 0.6 mg/dL (ref 0.3–1.2)
Total Protein: 8.5 g/dL — ABNORMAL HIGH (ref 6.5–8.1)

## 2017-05-01 LAB — CBC WITH DIFFERENTIAL/PLATELET
Basophils Absolute: 0 10*3/uL (ref 0.0–0.1)
Basophils Relative: 0 %
EOS ABS: 0 10*3/uL (ref 0.0–0.7)
EOS PCT: 0 %
HCT: 41.1 % (ref 36.0–46.0)
Hemoglobin: 14.3 g/dL (ref 12.0–15.0)
LYMPHS ABS: 1.2 10*3/uL (ref 0.7–4.0)
LYMPHS PCT: 10 %
MCH: 31 pg (ref 26.0–34.0)
MCHC: 34.8 g/dL (ref 30.0–36.0)
MCV: 89 fL (ref 78.0–100.0)
MONO ABS: 0.8 10*3/uL (ref 0.1–1.0)
Monocytes Relative: 7 %
Neutro Abs: 10.2 10*3/uL — ABNORMAL HIGH (ref 1.7–7.7)
Neutrophils Relative %: 83 %
Platelets: 422 10*3/uL — ABNORMAL HIGH (ref 150–400)
RBC: 4.62 MIL/uL (ref 3.87–5.11)
RDW: 13.4 % (ref 11.5–15.5)
WBC: 12.2 10*3/uL — AB (ref 4.0–10.5)

## 2017-05-01 LAB — ETHANOL

## 2017-05-01 LAB — I-STAT TROPONIN, ED: TROPONIN I, POC: 0 ng/mL (ref 0.00–0.08)

## 2017-05-01 LAB — VALPROIC ACID LEVEL

## 2017-05-01 LAB — MAGNESIUM: Magnesium: 1.8 mg/dL (ref 1.7–2.4)

## 2017-05-01 LAB — I-STAT CG4 LACTIC ACID, ED: Lactic Acid, Venous: 1.51 mmol/L (ref 0.5–1.9)

## 2017-05-01 MED ORDER — LORAZEPAM 1 MG PO TABS: 1.0000 mg | ORAL_TABLET | Freq: Three times a day (TID) | ORAL | Status: DC | PRN

## 2017-05-01 MED ORDER — ONDANSETRON HCL 4 MG/2ML IJ SOLN
4.0000 mg | Freq: Four times a day (QID) | INTRAMUSCULAR | Status: DC | PRN
  Administered 2017-05-02: 4 mg via INTRAVENOUS
  Filled 2017-05-01: qty 2

## 2017-05-01 MED ORDER — SODIUM CHLORIDE 0.9 % IV BOLUS (SEPSIS)
1000.0000 mL | Freq: Once | INTRAVENOUS | Status: AC
  Administered 2017-05-01: 1000 mL via INTRAVENOUS

## 2017-05-01 MED ORDER — VITAMIN D3 25 MCG (1000 UNIT) PO TABS
1000.0000 [IU] | ORAL_TABLET | Freq: Every day | ORAL | Status: DC
  Administered 2017-05-02 – 2017-05-03 (×2): 1000 [IU] via ORAL
  Filled 2017-05-01 (×2): qty 1

## 2017-05-01 MED ORDER — IBUPROFEN 200 MG PO TABS
400.0000 mg | ORAL_TABLET | Freq: Four times a day (QID) | ORAL | Status: DC | PRN
  Administered 2017-05-02: 400 mg via ORAL
  Filled 2017-05-01: qty 2

## 2017-05-01 MED ORDER — SENNOSIDES-DOCUSATE SODIUM 8.6-50 MG PO TABS: 1.0000 | ORAL_TABLET | Freq: Every evening | ORAL | Status: DC | PRN

## 2017-05-01 MED ORDER — DIVALPROEX SODIUM 500 MG PO DR TAB
500.0000 mg | DELAYED_RELEASE_TABLET | Freq: Two times a day (BID) | ORAL | Status: DC
  Administered 2017-05-02 – 2017-05-03 (×4): 500 mg via ORAL
  Filled 2017-05-01: qty 2
  Filled 2017-05-01: qty 1
  Filled 2017-05-01: qty 2
  Filled 2017-05-01 (×2): qty 1
  Filled 2017-05-01: qty 2
  Filled 2017-05-01: qty 1
  Filled 2017-05-01: qty 2
  Filled 2017-05-01: qty 1

## 2017-05-01 MED ORDER — ENOXAPARIN SODIUM 40 MG/0.4ML ~~LOC~~ SOLN
40.0000 mg | SUBCUTANEOUS | Status: DC
  Administered 2017-05-02 (×2): 40 mg via SUBCUTANEOUS
  Filled 2017-05-01 (×2): qty 0.4

## 2017-05-01 MED ORDER — ONE-DAILY MULTI VITAMINS PO TABS: 1.0000 | ORAL_TABLET | Freq: Every day | ORAL | Status: DC

## 2017-05-01 MED ORDER — SODIUM CHLORIDE 0.9 % IV SOLN: INTRAVENOUS | Status: AC

## 2017-05-01 MED ORDER — FOLIC ACID 1 MG PO TABS
1.0000 mg | ORAL_TABLET | Freq: Every day | ORAL | Status: DC
  Administered 2017-05-02 – 2017-05-03 (×2): 1 mg via ORAL
  Filled 2017-05-01 (×2): qty 1

## 2017-05-01 MED ORDER — LEVETIRACETAM 250 MG PO TABS
750.0000 mg | ORAL_TABLET | Freq: Two times a day (BID) | ORAL | Status: DC
  Administered 2017-05-02 – 2017-05-03 (×3): 750 mg via ORAL
  Filled 2017-05-01 (×4): qty 1

## 2017-05-01 MED ORDER — LABETALOL HCL 5 MG/ML IV SOLN
5.0000 mg | INTRAVENOUS | Status: DC | PRN
  Filled 2017-05-01: qty 4

## 2017-05-01 MED ORDER — VITAMIN B-1 100 MG PO TABS
100.0000 mg | ORAL_TABLET | Freq: Every day | ORAL | Status: DC
  Administered 2017-05-02 – 2017-05-03 (×2): 100 mg via ORAL
  Filled 2017-05-01 (×2): qty 1

## 2017-05-01 MED ORDER — ALBUTEROL SULFATE HFA 108 (90 BASE) MCG/ACT IN AERS: 2.0000 | INHALATION_SPRAY | Freq: Four times a day (QID) | RESPIRATORY_TRACT | Status: DC | PRN

## 2017-05-01 MED ORDER — SODIUM CHLORIDE 0.9 % IV SOLN
1000.0000 mg | Freq: Once | INTRAVENOUS | Status: AC
  Administered 2017-05-01: 1000 mg via INTRAVENOUS
  Filled 2017-05-01: qty 10

## 2017-05-01 MED ORDER — PANTOPRAZOLE SODIUM 40 MG PO TBEC
40.0000 mg | DELAYED_RELEASE_TABLET | Freq: Every day | ORAL | Status: DC
  Administered 2017-05-02 – 2017-05-03 (×2): 40 mg via ORAL
  Filled 2017-05-01 (×2): qty 1

## 2017-05-01 MED ORDER — ACETAMINOPHEN 650 MG RE SUPP: 650.0000 mg | Freq: Four times a day (QID) | RECTAL | Status: DC | PRN

## 2017-05-01 MED ORDER — ACETAMINOPHEN 325 MG PO TABS
650.0000 mg | ORAL_TABLET | Freq: Four times a day (QID) | ORAL | Status: DC | PRN
  Administered 2017-05-02: 650 mg via ORAL
  Filled 2017-05-01: qty 2

## 2017-05-01 MED ORDER — LORAZEPAM 2 MG/ML IJ SOLN
2.0000 mg | Freq: Once | INTRAMUSCULAR | Status: AC
  Administered 2017-05-01: 2 mg via INTRAMUSCULAR
  Filled 2017-05-01: qty 1

## 2017-05-01 MED ORDER — MAGNESIUM OXIDE 400 (241.3 MG) MG PO TABS
400.0000 mg | ORAL_TABLET | Freq: Three times a day (TID) | ORAL | Status: DC
  Administered 2017-05-02 – 2017-05-03 (×6): 400 mg via ORAL
  Filled 2017-05-01 (×6): qty 1

## 2017-05-01 MED ORDER — ADULT MULTIVITAMIN W/MINERALS CH
1.0000 | ORAL_TABLET | Freq: Every day | ORAL | Status: DC
  Administered 2017-05-02 – 2017-05-03 (×2): 1 via ORAL
  Filled 2017-05-01 (×2): qty 1

## 2017-05-01 MED ORDER — ONDANSETRON HCL 4 MG PO TABS: 4.0000 mg | ORAL_TABLET | Freq: Four times a day (QID) | ORAL | Status: DC | PRN

## 2017-05-01 MED ORDER — LORAZEPAM 2 MG/ML IJ SOLN: 1.0000 mg | Freq: Once | INTRAMUSCULAR | Status: DC

## 2017-05-01 MED ORDER — LORAZEPAM 2 MG/ML IJ SOLN: 1.0000 mg | Freq: Four times a day (QID) | INTRAMUSCULAR | Status: DC | PRN

## 2017-05-01 MED ORDER — LORAZEPAM 1 MG PO TABS: 0.0000 mg | ORAL_TABLET | Freq: Two times a day (BID) | ORAL | Status: DC

## 2017-05-01 MED ORDER — ALBUTEROL SULFATE (2.5 MG/3ML) 0.083% IN NEBU: 2.5000 mg | INHALATION_SOLUTION | Freq: Four times a day (QID) | RESPIRATORY_TRACT | Status: DC | PRN

## 2017-05-01 MED ORDER — BISACODYL 5 MG PO TBEC: 5.0000 mg | DELAYED_RELEASE_TABLET | Freq: Every day | ORAL | Status: DC | PRN

## 2017-05-01 MED ORDER — LORAZEPAM 1 MG PO TABS: 0.0000 mg | ORAL_TABLET | Freq: Four times a day (QID) | ORAL | Status: AC

## 2017-05-01 MED ORDER — THIAMINE HCL 100 MG/ML IJ SOLN
100.0000 mg | Freq: Every day | INTRAMUSCULAR | Status: DC
  Filled 2017-05-01: qty 2

## 2017-05-01 MED ORDER — LORAZEPAM 1 MG PO TABS
1.0000 mg | ORAL_TABLET | Freq: Four times a day (QID) | ORAL | Status: DC | PRN
  Administered 2017-05-02 – 2017-05-03 (×2): 1 mg via ORAL
  Filled 2017-05-01 (×2): qty 1

## 2017-05-01 NOTE — H&P (Signed)
History and Physical    Lindsey [name] NKN:[id number] DOB: Jun 08, 1956 DOA: 05/01/2017  PCP: [name], MD   Patient coming from: Home  Chief Complaint: Seizures   HPI: Lindsey [name] is a 61 y.o. female with medical history significant for seizure disorder, polysubstance abuse, and mild intermittent asthma, now presenting to the emergency department after 3 seizures at home. Per the report of the patient's family, they suspect that she has been drinking alcohol again and taking her medications. She was noted to have a generalized seizure at home today and returned to baseline after words. She then had a second seizure and reportedly did not return to baseline before a third seizure was witnessed by family. At this point, she was brought into the ED for evaluation. Patient has been intermittently cooperative with history and reports running out of her medications. Denies headache, fevers, or illicit drug use.   ED Course: Upon arrival to the ED, patient is found to be afebrile, saturating well on room air, slightly tachycardic, and with vitals otherwise stable. EKG features a sinus tachycardia with rate 107. Noncontrast head CT is negative for acute intracranial abnormality. Chemistry panel was unremarkable. CBC is notable for a mild leukocytosis to 12,200 and a slight chronic thrombocytosis with platelets 422,000. Lactic acid is within the normal limits and troponin is undetectable. Valproic acid level was undetectable. Ethanol level is undetectable. Patient was not being cooperative in the ED and IV cannot be started, and so she was treated with 2 mg of IM Ativan, and then had IV access obtained. Neurology was consulted by the ED physician and advised for medical admission with continuation/resumption of the patient's normal medications. Patient was treated with 1 L of normal saline, 1 g of IV Keppra, remained hemodynamically stable, and will be observed on medical-surgical unit for ongoing  evaluation and management of seizures, likely secondary to running out of her antiepileptic medications, with alcohol withdrawal or benzodiazepine withdrawal also considered.  Review of Systems:  All other systems reviewed and apart from HPI, are negative.  Past Medical History:  Diagnosis Date  . Asthma   . H/O ETOH abuse   . Seizure [facility])     Past Surgical History:  Procedure Laterality Date  . ABDOMINAL HYSTERECTOMY    . GALLBLADDER SURGERY       reports that she has been smoking Cigarettes.  She has never used smokeless tobacco. She reports that she uses drugs, including Marijuana. She reports that she does not drink alcohol.  Allergies  Allergen Reactions  . Kiwi Extract Hives and Itching  . Morphine And Related Hives  . Penicillins Hives and Itching    Has patient had a PCN reaction causing immediate rash, facial/tongue/throat swelling, SOB or lightheadedness with hypotension: Yes Has patient had a PCN reaction causing severe rash involving mucus membranes or skin necrosis: No Has patient had a PCN reaction that required hospitalization No Has patient had a PCN reaction occurring within the last 10 years: No If all of the above answers are "NO", then may proceed with Cephalosporin use.  . Strawberry Extract Hives and Itching    Family History  Problem Relation Age of Onset  . Seizures Mother   . Alcohol abuse Mother   . Heart attack Father   . Drug abuse Brother   . Cancer Brother        Pancreatic     Prior to Admission medications   Medication Sig Start Date End Date Taking? Authorizing Provider  acetaminophen (TYLENOL)  500 MG tablet Take 500 mg by mouth every 8 (eight) hours as needed.    [provider]  acetaminophen-codeine (TYLENOL #3) 300-30 MG tablet Take by mouth every 6 (six) hours as needed for moderate pain.    [provider]  albuterol (PROAIR HFA) 108 (90 Base) MCG/ACT inhaler Inhale 2 puffs into the lungs every 6 (six) hours as  needed for wheezing or shortness of breath.    [provider]  amLODipine (NORVASC) 5 MG tablet Take 1 tablet (5 mg total) by mouth daily. Patient not taking: Reported on 07/29/2016 06/27/16   Theodoro Grist, MD  cholecalciferol (VITAMIN D) 1000 units tablet Take 1,000 Units by mouth daily.    [provider]  divalproex (DEPAKOTE) 500 MG DR tablet Take 1 tablet (500 mg total) by mouth 2 (two) times daily. 09/26/16   Drenda Freeze, MD  folic acid (FOLVITE) 1 MG tablet Take 1 mg by mouth daily.    [provider]  levETIRAcetam (KEPPRA) 750 MG tablet Take 1 tablet (750 mg total) by mouth 2 (two) times daily. 09/26/16   Drenda Freeze, MD  LORazepam (ATIVAN) 1 MG tablet Take 1 mg by mouth every 8 (eight) hours as needed for anxiety.    [provider]  magnesium oxide (MAG-OX) 400 MG tablet Take 400 mg by mouth 3 (three) times daily.    [provider]  Multiple Vitamin (MULTIVITAMIN) tablet Take 1 tablet by mouth daily.    [provider]  nicotine (NICODERM CQ - DOSED IN MG/24 HOURS) 21 mg/24hr patch Place 1 patch (21 mg total) onto the skin daily. Patient not taking: Reported on 07/29/2016 06/27/16   Theodoro Grist, MD  pantoprazole (PROTONIX) 40 MG tablet Take 1 tablet (40 mg total) by mouth 2 (two) times daily. Patient not taking: Reported on 07/29/2016 08/19/15   Reyne Dumas, MD  thiamine 100 MG tablet Take 3 tablets (300 mg total) by mouth daily. Patient not taking: Reported on 07/29/2016 06/27/16   Theodoro Grist, MD    Physical Exam: Vitals:   05/01/17 1556 05/01/17 1754  BP: (!) 174/83 (!) 184/97  Pulse: (!) 105 100  Resp: 18 17  Temp: 98.5 F (36.9 C)   TempSrc: Oral   SpO2: 95% 100%      Constitutional: NAD, somnolent, easily roused  Eyes: PERTLA, lids and conjunctivae normal ENMT: Mucous membranes are dry. Posterior pharynx clear of any exudate or lesions.   Neck: normal, supple, no masses, no  thyromegaly Respiratory: clear to auscultation bilaterally, no wheezing, no crackles. Normal respiratory effort.  Cardiovascular: Rate ~110 and regular. No extremity edema. No significant JVD. Abdomen: No distension, no tenderness, no masses palpated. Bowel sounds normal.  Musculoskeletal: no clubbing / cyanosis. No joint deformity upper and lower extremities.   Skin: no significant rashes, lesions, ulcers. Warm, dry, well-perfused. Poor turgor.  Neurologic: CN 2-12 grossly intact. Sensation intact, DTR normal. Strength 5/5 in all 4 limbs.  Psychiatric: Somnolent, easily roused and oriented x 3. Intermittently cooperative.     Labs on Admission: I have personally reviewed following labs and imaging studies  CBC:  Recent Labs Lab 05/01/17 1635  WBC 12.2*  NEUTROABS 10.2*  HGB 14.3  HCT 41.1  MCV 89.0  PLT 854*   Basic Metabolic Panel:  Recent Labs Lab 05/01/17 1635  NA 144  K 4.7  CL 106  CO2 27  GLUCOSE 102*  BUN 17  CREATININE 0.78  CALCIUM 9.6   GFR: CrCl  cannot be calculated (Unknown ideal weight.). Liver Function Tests:  Recent Labs Lab 05/01/17 1635  AST 26  ALT 16  ALKPHOS 106  BILITOT 0.6  PROT 8.5*  ALBUMIN 4.4   No results for input(s): LIPASE, AMYLASE in the last 168 hours. No results for input(s): AMMONIA in the last 168 hours. Coagulation Profile: No results for input(s): INR, PROTIME in the last 168 hours. Cardiac Enzymes: No results for input(s): CKTOTAL, CKMB, CKMBINDEX, TROPONINI in the last 168 hours. BNP (last 3 results) No results for input(s): PROBNP in the last 8760 hours. HbA1C: No results for input(s): HGBA1C in the last 72 hours. CBG: No results for input(s): GLUCAP in the last 168 hours. Lipid Profile: No results for input(s): CHOL, HDL, LDLCALC, TRIG, CHOLHDL, LDLDIRECT in the last 72 hours. Thyroid Function Tests: No results for input(s): TSH, T4TOTAL, FREET4, T3FREE, THYROIDAB in the last 72 hours. Anemia Panel: No  results for input(s): VITAMINB12, FOLATE, FERRITIN, TIBC, IRON, RETICCTPCT in the last 72 hours. Urine analysis:    Component Value Date/Time   COLORURINE YELLOW (A) 06/24/2016 1816   APPEARANCEUR CLEAR (A) 06/24/2016 1816   APPEARANCEUR Clear 01/28/2014 1937   LABSPEC 1.014 06/24/2016 1816   LABSPEC 1.015 01/28/2014 1937   PHURINE 6.0 06/24/2016 1816   GLUCOSEU NEGATIVE 06/24/2016 1816   GLUCOSEU Negative 01/28/2014 1937   HGBUR 1+ (A) 06/24/2016 1816   BILIRUBINUR NEGATIVE 06/24/2016 1816   BILIRUBINUR Negative 01/28/2014 1937   KETONESUR NEGATIVE 06/24/2016 1816   PROTEINUR 30 (A) 06/24/2016 1816   UROBILINOGEN 0.2 03/01/2015 1935   NITRITE NEGATIVE 06/24/2016 1816   LEUKOCYTESUR NEGATIVE 06/24/2016 1816   LEUKOCYTESUR Negative 01/28/2014 1937   Sepsis Labs: @LABRCNTIP (procalcitonin:4,lacticidven:4) )No results found for this or any previous visit (from the past 240 hour(s)).   Radiological Exams on Admission: Ct Head Wo Contrast  Result Date: 05/01/2017 CLINICAL DATA:  61 y/o F; 3 witnessed seizures. Altered mental status. EXAM: CT HEAD WITHOUT CONTRAST TECHNIQUE: Contiguous axial images were obtained from the base of the skull through the vertex without intravenous contrast. COMPARISON:  06/24/2016 CT of the head. FINDINGS: Brain: No evidence of acute infarction, hemorrhage, hydrocephalus, extra-axial collection or mass lesion/mass effect. Stable mild chronic microvascular ischemic changes and mild parenchymal volume loss of the brain. Vascular: Mild calcific atherosclerosis of carotid siphons. No hyperdense vessel. Skull: Normal. Negative for fracture or focal lesion. Sinuses/Orbits: No acute finding. Other: None. IMPRESSION: 1. No acute intracranial abnormality identified. 2. Stable mild chronic microvascular ischemic changes and mild parenchymal volume loss of the brain. Electronically Signed   By: Kristine Garbe M.D.   On: 05/01/2017 17:34    EKG: Independently  reviewed. Sinus tachycardia (rate 107).  Assessment/Plan  1. Seizures  - Pt has seizure disorder for which she is prescribed Depakote and Keppra  - She presents after 3 seizures at home witnessed by family, who report that she has begun drinking alcohol again and not taking her medications; pt reports running out of her medications  - Depakote level undetectable  - No focal neurologic deficit elicited and head CT negative for acute intracranial abnormality  - No fevers, mild leukocytosis likely reactive, lactate reassuringly low  - Likely secondary to subtherapeutic antiepileptic levels, versus possible alcohol or benzodiazepine withdrawal  - She was loaded with 1 g IV Keppra in ED and given 2 mg IM Ativan for agitation  - Neurology consulted by ED provider and medicine admission with resumption of home medications recommended - Check magnesium level, resume home  medications    2. Polysubstance abuse  - UDS is pending, EtOH level <5  - Monitor with CIWA and prn Ativan, supplement vitamins and minerals  3. Asthma  - Uses albuterol MDI occasionally  - No wheezing or dyspnea on admission  - Continue prn albuterol    4. Hypertension  - Prescribed Norvasc, but stopped taking  - BP elevated in ED, alcohol withdrawal possible and she will be monitored with CIWA and prn Ativan  - Treat with PRN agents for now    DVT prophylaxis: sq Lovenox  Code Status: Full  Family Communication: Discussed with patient Disposition Plan: Observe on med-surg Consults called: Neurology Admission status: Observation    Vianne Bulls, MD [facility] Pager [phone]  If 7PM-7AM, please contact night-coverage [email or web address] Password [facility]  05/01/2017, 6:26 PM

## 2017-05-01 NOTE — ED Triage Notes (Signed)
Patient here from home with complaints of 3 witnessed seizures this afternoon with last one lasting 44min. Hx of same. CBG 100.

## 2017-05-01 NOTE — ED Notes (Signed)
Bed: WI09 Expected date:  Expected time:  Means of arrival:  Comments: 61 yo seizure w/ hx of the same

## 2017-05-01 NOTE — ED Provider Notes (Signed)
[facility] DEPT Provider Note   CSN: [id number] Arrival date & time: 05/01/17  1534     History   Chief Complaint No chief complaint on file.   HPI Lindsey [name] is a 61 y.o. female.  HPI Lindsey [name] is a 61 y.o. female with history of asthma, alcohol abuse, seizures, presents to emergency department complaining of a seizure. Patient coming from home. Patient apparently had 3 seizures per EMS. Patient is a poor historian and unable to provide any history due to her altered mental status. Patient did inform me that she did have a seizure and also told me that she is having some abdominal pain and ran out of her seizure medication. It is unclear when her medications ran out at this time.  Past Medical History:  Diagnosis Date  . Asthma   . H/O ETOH abuse   . Seizure [facility])     Patient Active Problem List   Diagnosis Date Noted  . Seizures ([place]) 06/26/2016  . Patient's noncompliance with other medical treatment and regimen 06/26/2016  . Leukocytosis 06/26/2016  . Hyperglycemia 06/26/2016  . Tobacco abuse 06/26/2016  . Alcohol withdrawal seizure ([place]) 06/25/2016  . Psychosis 11/04/2015  . Acute respiratory failure with hypoxia ([place]) 11/03/2015  . Essential hypertension 11/03/2015  . 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 Asthma 10/10/2015  . Seizure disorder ([place]) 08/15/2015  . Status epilepticus ([place]) 08/15/2015  . Perinephric hematoma   . Head trauma   . Post-ictal state ([place]) 03/01/2015  . Seizure secondary to subtherapeutic anticonvulsant medication ([place]) 02/06/2015  . Recurrent seizures ([place]) 02/06/2015  . Acute encephalopathy   . Polysubstance abuse   . Alcohol dependence with withdrawal with complication ([place]) 40/06/2724  . Protein-calorie malnutrition, severe ([place]) 03/04/2014  . Encephalopathy 03/04/2014  . Fever 03/04/2014  . Alcohol abuse 11/06/2013  . Alcohol withdrawal ([place]) 11/06/2013  . Marijuana abuse 11/06/2013  . Seizure ([place])  11/05/2013  . Altered mental status 11/05/2013    Past Surgical History:  Procedure Laterality Date  . ABDOMINAL HYSTERECTOMY    . GALLBLADDER SURGERY      OB History    No data available       Home Medications    Prior to Admission medications   Medication Sig Start Date End Date Taking? Authorizing Provider  acetaminophen (TYLENOL) 500 MG tablet Take 500 mg by mouth every 8 (eight) hours as needed.    [provider]  acetaminophen-codeine (TYLENOL #3) 300-30 MG tablet Take by mouth every 6 (six) hours as needed for moderate pain.    [provider]  albuterol (PROAIR HFA) 108 (90 Base) MCG/ACT inhaler Inhale 2 puffs into the lungs every 6 (six) hours as needed for wheezing or shortness of breath.    [provider]  amLODipine (NORVASC) 5 MG tablet Take 1 tablet (5 mg total) by mouth daily. Patient not taking: Reported on 07/29/2016 06/27/16   Theodoro Grist, MD  cholecalciferol (VITAMIN D) 1000 units tablet Take 1,000 Units by mouth daily.    [provider]  divalproex (DEPAKOTE) 500 MG DR tablet Take 1 tablet (500 mg total) by mouth 2 (two) times daily. 09/26/16   Drenda Freeze, MD  folic acid (FOLVITE) 1 MG tablet Take 1 mg by mouth daily.    [provider]  levETIRAcetam (KEPPRA) 750 MG tablet Take 1 tablet (750 mg total) by mouth 2 (two) times daily. 09/26/16   Drenda Freeze, MD  LORazepam (ATIVAN) 1 MG tablet Take 1  mg by mouth every 8 (eight) hours as needed for anxiety.    [provider]  magnesium oxide (MAG-OX) 400 MG tablet Take 400 mg by mouth 3 (three) times daily.    [provider]  Multiple Vitamin (MULTIVITAMIN) tablet Take 1 tablet by mouth daily.    [provider]  nicotine (NICODERM CQ - DOSED IN MG/24 HOURS) 21 mg/24hr patch Place 1 patch (21 mg total) onto the skin daily. Patient not taking: Reported on 07/29/2016 06/27/16   Theodoro Grist, MD  pantoprazole (PROTONIX) 40 MG  tablet Take 1 tablet (40 mg total) by mouth 2 (two) times daily. Patient not taking: Reported on 07/29/2016 08/19/15   Reyne Dumas, MD  thiamine 100 MG tablet Take 3 tablets (300 mg total) by mouth daily. Patient not taking: Reported on 07/29/2016 06/27/16   Theodoro Grist, MD    Family History Family History  Problem Relation Age of Onset  . Seizures Mother   . Alcohol abuse Mother   . Heart attack Father   . Drug abuse Brother   . Cancer Brother        Pancreatic    Social History Social History  Substance Use Topics  . Smoking status: Current Some Day Smoker    Types: Cigarettes  . Smokeless tobacco: Never Used  . Alcohol use No     Comment: Hx of alchol abuse, not currently drinking     Allergies   Kiwi extract; Morphine and related; Penicillins; and Strawberry extract   Review of Systems Review of Systems  Unable to perform ROS: Mental status change     Physical Exam Updated Vital Signs There were no vitals taken for this visit.  Physical Exam  Constitutional: She appears well-developed and well-nourished.  Patient is laying with her eyes closed, not responding to any questions, is moving her head and face. Refusing to move her arms or legs. Response to painful stimuli  HENT:  Head: Normocephalic and atraumatic.  Eyes: Conjunctivae are normal.  Patient is not tolerating light in her eyes, fighting, combative  Neck: Normal range of motion. Neck supple.  Cardiovascular: Normal rate, regular rhythm and normal heart sounds.   Pulmonary/Chest: Effort normal and breath sounds normal. No respiratory distress. She has no wheezes. She has no rales.  Abdominal: Soft. Bowel sounds are normal. She exhibits no distension. There is no tenderness. There is no rebound.  Musculoskeletal: She exhibits no edema.  Neurological: She is alert.  Patient was able to tell me that where the hospital, does not answer any other questions.  Skin: Skin is warm and dry.  Psychiatric:  She has a normal mood and affect. Her behavior is normal.  Nursing note and vitals reviewed.    ED Treatments / Results  Labs (all labs ordered are listed, but only abnormal results are displayed) Labs Reviewed  CBC WITH DIFFERENTIAL/PLATELET  COMPREHENSIVE METABOLIC PANEL  VALPROIC ACID LEVEL  ETHANOL  I-STAT TROPONIN, ED  I-STAT CG4 LACTIC ACID, ED    EKG  EKG Interpretation None       Radiology No results found.  Procedures Procedures (including critical care time)  Medications Ordered in ED Medications  sodium chloride 0.9 % bolus 1,000 mL (not administered)     Initial Impression / Assessment and Plan / ED Course  I have reviewed the triage vital signs and the nursing notes.  Pertinent labs & imaging results that were available during my care of the patient were reviewed by me and considered in  my medical decision making (see chart for details).     Patient ED with altered mental status, apparently had 3 seizures at home. History of medication noncompliance and she did admit to me that she hasn't been taking her medications as of recently. Patient is selectively answering some questions and refusing to move her arms or legs on command. She did respond to painful stimuli when I squeezed her leg and began cursing and fighting. Patient finally started yelling to leave her alone. We'll try to obtain some labs, will contact family for more information. We'll get CT head given altered mental status. Patient also has history of alcohol abuse will get alcohol level.  4:11 PM Unable to get IV access or blood test. Patient is fighting, combative. Question if she is still postictal. I tried to contact patient's family, the cell phone number provided is overall number, and home number is no one answering. I will give her Ativan IM and attempted blood draw again.   5:45 PM Spoke with neurology Dr. Lorraine Lax, who recommended medicine admission, continue regular medications,  montior. If they are needed for medicine to call back.    5:59 PM Finally spoke with patient's daughter. Sounds like patient had 3 seizures prior to arrival, was back to baseline after the first seizure but never came back to normal between the next 2. Family believes the patient has been drinking again and not taking her medications. Patient does have history of being combative as her postictal state. I did write down the number for patient's daughter Krystal Eaton: [phone]   Medicine to admit     Final Clinical Impressions(s) / ED Diagnoses   Final diagnoses:  Seizures ([place])  Altered mental status, unspecified altered mental status type    New Prescriptions New Prescriptions   No medications on file     Jeannett Senior, PA-C 05/04/17 [place], MD 05/15/17 1714

## 2017-05-01 NOTE — Progress Notes (Signed)
Pt. arrived to floor via stretcher from ED. Eyes closed, but responds to voice and touch. No respiratory distress noted. Seizure pads placed on bed, patient settled.

## 2017-05-01 NOTE — ED Notes (Signed)
Pt refusing all treatment. PA made aware

## 2017-05-02 DIAGNOSIS — F1010 Alcohol abuse, uncomplicated: Secondary | ICD-10-CM | POA: Diagnosis not present

## 2017-05-02 DIAGNOSIS — Z87891 Personal history of nicotine dependence: Secondary | ICD-10-CM | POA: Diagnosis not present

## 2017-05-02 DIAGNOSIS — Z881 Allergy status to other antibiotic agents status: Secondary | ICD-10-CM | POA: Diagnosis not present

## 2017-05-02 DIAGNOSIS — Z9119 Patient's noncompliance with other medical treatment and regimen: Secondary | ICD-10-CM | POA: Diagnosis not present

## 2017-05-02 DIAGNOSIS — Z880 Allergy status to penicillin: Secondary | ICD-10-CM | POA: Diagnosis not present

## 2017-05-02 DIAGNOSIS — K2970 Gastritis, unspecified, without bleeding: Secondary | ICD-10-CM | POA: Diagnosis present

## 2017-05-02 DIAGNOSIS — D473 Essential (hemorrhagic) thrombocythemia: Secondary | ICD-10-CM | POA: Diagnosis present

## 2017-05-02 DIAGNOSIS — G40909 Epilepsy, unspecified, not intractable, without status epilepticus: Secondary | ICD-10-CM | POA: Diagnosis not present

## 2017-05-02 DIAGNOSIS — Z79899 Other long term (current) drug therapy: Secondary | ICD-10-CM | POA: Diagnosis not present

## 2017-05-02 DIAGNOSIS — J4520 Mild intermittent asthma, uncomplicated: Secondary | ICD-10-CM | POA: Diagnosis present

## 2017-05-02 DIAGNOSIS — F1910 Other psychoactive substance abuse, uncomplicated: Secondary | ICD-10-CM | POA: Diagnosis not present

## 2017-05-02 DIAGNOSIS — Z9114 Patient's other noncompliance with medication regimen: Secondary | ICD-10-CM | POA: Diagnosis not present

## 2017-05-02 DIAGNOSIS — R569 Unspecified convulsions: Secondary | ICD-10-CM

## 2017-05-02 DIAGNOSIS — D72829 Elevated white blood cell count, unspecified: Secondary | ICD-10-CM | POA: Diagnosis present

## 2017-05-02 DIAGNOSIS — R509 Fever, unspecified: Secondary | ICD-10-CM | POA: Diagnosis not present

## 2017-05-02 DIAGNOSIS — F10239 Alcohol dependence with withdrawal, unspecified: Secondary | ICD-10-CM | POA: Diagnosis present

## 2017-05-02 DIAGNOSIS — F13239 Sedative, hypnotic or anxiolytic dependence with withdrawal, unspecified: Secondary | ICD-10-CM | POA: Diagnosis present

## 2017-05-02 DIAGNOSIS — I10 Essential (primary) hypertension: Secondary | ICD-10-CM | POA: Diagnosis present

## 2017-05-02 DIAGNOSIS — Z9102 Food additives allergy status: Secondary | ICD-10-CM | POA: Diagnosis not present

## 2017-05-02 DIAGNOSIS — J45909 Unspecified asthma, uncomplicated: Secondary | ICD-10-CM | POA: Diagnosis present

## 2017-05-02 LAB — URINALYSIS, ROUTINE W REFLEX MICROSCOPIC
Bilirubin Urine: NEGATIVE
GLUCOSE, UA: NEGATIVE mg/dL
Ketones, ur: 20 mg/dL — AB
LEUKOCYTES UA: NEGATIVE
Nitrite: NEGATIVE
PH: 5 (ref 5.0–8.0)
Protein, ur: NEGATIVE mg/dL
SPECIFIC GRAVITY, URINE: 1.017 (ref 1.005–1.030)

## 2017-05-02 LAB — BASIC METABOLIC PANEL
ANION GAP: 8 (ref 5–15)
BUN: 15 mg/dL (ref 6–20)
CHLORIDE: 109 mmol/L (ref 101–111)
CO2: 24 mmol/L (ref 22–32)
Calcium: 8.8 mg/dL — ABNORMAL LOW (ref 8.9–10.3)
Creatinine, Ser: 0.76 mg/dL (ref 0.44–1.00)
GFR calc non Af Amer: >60 mL/min (ref >60)
Glucose, Bld: 124 mg/dL — ABNORMAL HIGH (ref 65–99)
POTASSIUM: 3.4 mmol/L — AB (ref 3.5–5.1)
SODIUM: 141 mmol/L (ref 135–145)

## 2017-05-02 LAB — RAPID URINE DRUG SCREEN, HOSP PERFORMED
AMPHETAMINES: NOT DETECTED
BARBITURATES: NOT DETECTED
BENZODIAZEPINES: POSITIVE — AB
COCAINE: NOT DETECTED
Opiates: NOT DETECTED
TETRAHYDROCANNABINOL: POSITIVE — AB

## 2017-05-02 LAB — CBC
HEMATOCRIT: 36.3 % (ref 36.0–46.0)
HEMOGLOBIN: 12.6 g/dL (ref 12.0–15.0)
MCH: 30.1 pg (ref 26.0–34.0)
MCHC: 34.7 g/dL (ref 30.0–36.0)
MCV: 86.8 fL (ref 78.0–100.0)
Platelets: 330 10*3/uL (ref 150–400)
RBC: 4.18 MIL/uL (ref 3.87–5.11)
RDW: 13.3 % (ref 11.5–15.5)
WBC: 9.3 10*3/uL (ref 4.0–10.5)

## 2017-05-02 LAB — HIV ANTIBODY (ROUTINE TESTING W REFLEX): HIV SCREEN 4TH GENERATION: NONREACTIVE

## 2017-05-02 LAB — VALPROIC ACID LEVEL: VALPROIC ACID LVL: 42 ug/mL — AB (ref 50.0–100.0)

## 2017-05-02 MED ORDER — TRAMADOL HCL 50 MG PO TABS
50.0000 mg | ORAL_TABLET | Freq: Four times a day (QID) | ORAL | Status: DC | PRN
  Administered 2017-05-02 – 2017-05-03 (×2): 50 mg via ORAL
  Filled 2017-05-02 (×2): qty 1

## 2017-05-02 MED ORDER — GI COCKTAIL ~~LOC~~: 30.0000 mL | Freq: Three times a day (TID) | ORAL | Status: DC | PRN

## 2017-05-02 MED ORDER — TRAMADOL HCL 50 MG PO TABS: 50.0000 mg | ORAL_TABLET | Freq: Four times a day (QID) | ORAL | Status: DC | PRN

## 2017-05-02 NOTE — Progress Notes (Signed)
Patient ID: Lindsey [name], female   DOB: July 19, 1956, 61 y.o.   MRN: [id number]  PROGRESS NOTE    Lindsey [name]  WUG:[id number] DOB: 06-18-1956 DOA: 05/01/2017 PCP: [name], MD   Brief Narrative:  61 year old female with history of seizure disorder, polysubstance abuse, noncompliance and mild intermittent asthma presented with 3 seizures at home. She was admitted with seizures probably secondary to alcohol/benzodiazepine withdrawal   Assessment & Plan:   Principal Problem:   Seizures ([place]) Active Problems:   Alcohol abuse   Polysubstance abuse   Seizure disorder ([place])   Asthma   Patient's noncompliance with other medical treatment and regimen    1. Seizures  - Likely secondary to subtherapeutic antiepileptic levels, versus possible alcohol or benzodiazepine withdrawal  - Continue with Depakote and Keppra. Patient apparently is noncompliant with Depakote and Keppra at home. Fall/Aspiration/seizure precautions. - No focal neurologic deficit elicited and head CT negative for acute intracranial abnormality  - No fevers, mild leukocytosis likely reactive, lactate reassuringly low  - Neurology consulted by ED provider who recommended resumption of home medications  - Outpatient follow-up with primary care provider and/or neurology - PT evaluation  2. Polysubstance abuse  - UDS is positive for benzodiazepines and tetrahydrocannabinol. Counseled about cessation of substance abuse and alcohol abstinence. Social worker consult - Monitor with CIWA and prn Ativan - Continue thiamine, folate and multivitamins  3. Asthma  - Stable - Continue prn albuterol    4. Hypertension  -Monitor blood pressure. - Treat with PRN agents for now   5. Abdominal pain - Probably from gastritis from alcohol use. His Protonix and GI cocktail. If persistent abdominal pain, we'll get ultrasound versus CT. Repeat LFTs for tomorrow  6. One episode of fever - Questionable cause. Might be  from withdrawal. Afebrile since then. Monitor off antibiotics. Monitor   DVT prophylaxis: sq Lovenox  Code Status: Full  Family Communication: Discussed with patient Disposition Plan:  home in 1-2 days Consultants: Neurology was consulted on phone by ED Procedures: None Antibiotics: None  Subjective:  Patient seen and examined at bedside. She is awake and answering some questions. She complains of abdominal pain. No nausea or vomiting.  Objective: Vitals:   05/01/17 1911 05/01/17 2212 05/01/17 2300 05/02/17 0556  BP: (!) 170/86 (!) 153/82  140/79  Pulse: (!) 112 (!) 116  96  Resp: 18 18  16   Temp: (!) 101.6 F (38.7 C) 98 F (36.7 C)  98.4 F (36.9 C)  TempSrc: Tympanic Axillary  Oral  SpO2: 98% 100%    Weight: 67.9 kg (149 lb 12.8 oz)  67.6 kg (149 lb) 67.6 kg (149 lb)  Height:   5\' 8"  (1.727 m)    No intake or output data in the 24 hours ending 05/02/17 0957 Filed Weights   05/01/17 1911 05/01/17 2300 05/02/17 0556  Weight: 67.9 kg (149 lb 12.8 oz) 67.6 kg (149 lb) 67.6 kg (149 lb)    Examination:  General exam: Appears Older than stated age; no acute distress Respiratory system: Bilateral decreased breath sound at bases Cardiovascular system: S1 & S2 heard,  currently rate controlled  Gastrointestinal system: Abdomen is nondistended, soft and  mild epigastric tenderness Normal bowel sounds heard. Central nervous system: Alert and oriented. No focal neurological deficits. Moving extremities Extremities: No cyanosis, clubbing, edema  Skin: No rashes, lesions or ulcers Lymph: No cervical lymphadenopathy    Data Reviewed: I have personally reviewed following labs and imaging studies  CBC:  Recent Labs Lab 05/01/17  1635 05/02/17 0425  WBC 12.2* 9.3  NEUTROABS 10.2*  --   HGB 14.3 12.6  HCT 41.1 36.3  MCV 89.0 86.8  PLT 422* 902   Basic Metabolic Panel:  Recent Labs Lab 05/01/17 1635 05/01/17 2125 05/02/17 0425  NA 144  --  141  K 4.7  --  3.4*  CL  106  --  109  CO2 27  --  24  GLUCOSE 102*  --  124*  BUN 17  --  15  CREATININE 0.78  --  0.76  CALCIUM 9.6  --  8.8*  MG  --  1.8  --    GFR: Estimated Creatinine Clearance: 75.4 mL/min (by C-G formula based on SCr of 0.76 mg/dL). Liver Function Tests:  Recent Labs Lab 05/01/17 1635  AST 26  ALT 16  ALKPHOS 106  BILITOT 0.6  PROT 8.5*  ALBUMIN 4.4   No results for input(s): LIPASE, AMYLASE in the last 168 hours. No results for input(s): AMMONIA in the last 168 hours. Coagulation Profile: No results for input(s): INR, PROTIME in the last 168 hours. Cardiac Enzymes: No results for input(s): CKTOTAL, CKMB, CKMBINDEX, TROPONINI in the last 168 hours. BNP (last 3 results) No results for input(s): PROBNP in the last 8760 hours. HbA1C: No results for input(s): HGBA1C in the last 72 hours. CBG: No results for input(s): GLUCAP in the last 168 hours. Lipid Profile: No results for input(s): CHOL, HDL, LDLCALC, TRIG, CHOLHDL, LDLDIRECT in the last 72 hours. Thyroid Function Tests: No results for input(s): TSH, T4TOTAL, FREET4, T3FREE, THYROIDAB in the last 72 hours. Anemia Panel: No results for input(s): VITAMINB12, FOLATE, FERRITIN, TIBC, IRON, RETICCTPCT in the last 72 hours. Sepsis Labs:  Recent Labs Lab 05/01/17 1645  LATICACIDVEN 1.51    No results found for this or any previous visit (from the past 240 hour(s)).       Radiology Studies: Ct Head Wo Contrast  Result Date: 05/01/2017 CLINICAL DATA:  61 y/o F; 3 witnessed seizures. Altered mental status. EXAM: CT HEAD WITHOUT CONTRAST TECHNIQUE: Contiguous axial images were obtained from the base of the skull through the vertex without intravenous contrast. COMPARISON:  06/24/2016 CT of the head. FINDINGS: Brain: No evidence of acute infarction, hemorrhage, hydrocephalus, extra-axial collection or mass lesion/mass effect. Stable mild chronic microvascular ischemic changes and mild parenchymal volume loss of the brain.  Vascular: Mild calcific atherosclerosis of carotid siphons. No hyperdense vessel. Skull: Normal. Negative for fracture or focal lesion. Sinuses/Orbits: No acute finding. Other: None. IMPRESSION: 1. No acute intracranial abnormality identified. 2. Stable mild chronic microvascular ischemic changes and mild parenchymal volume loss of the brain. Electronically Signed   By: Kristine Garbe M.D.   On: 05/01/2017 17:34        Scheduled Meds: . cholecalciferol  1,000 Units Oral Daily  . divalproex  500 mg Oral BID  . enoxaparin (LOVENOX) injection  40 mg Subcutaneous Q24H  . folic acid  1 mg Oral Daily  . levETIRAcetam  750 mg Oral BID  . LORazepam  0-4 mg Oral Q6H   Followed by  . [START ON 05/03/2017] LORazepam  0-4 mg Oral Q12H  . magnesium oxide  400 mg Oral TID  . multivitamin with minerals  1 tablet Oral Daily  . pantoprazole  40 mg Oral Daily  . thiamine  100 mg Oral Daily   Or  . thiamine  100 mg Intravenous Daily   Continuous Infusions:   LOS: 0 days  Aline August, MD [facility] Pager [phone]  If 7PM-7AM, please contact night-coverage [email or web address] Password [email or web address] 05/02/2017, 9:57 AM

## 2017-05-02 NOTE — Evaluation (Signed)
Physical Therapy Evaluation Patient Details Name: Lindsey [name] MRN: [id number] DOB: 24-Oct-1955 Today's Date: 05/02/2017   History of Present Illness  61 year old female with history of seizure disorder, polysubstance abuse, noncompliance and mild intermittent asthma presented with 3 seizures at home. She was admitted with seizures probably secondary to alcohol/benzodiazepine withdrawal  Clinical Impression  Pt admitted with above diagnosis. Pt currently with functional limitations due to the deficits listed below (see PT Problem List). Min A to ambulate 85' with RW. Pt is poor historian (oriented to self, location, date but cannot recall if her home has stairs or not) so difficult to obtain baseline information. Good progress expected.  Pt will benefit from skilled PT to increase their independence and safety with mobility to allow discharge to the venue listed below.       Follow Up Recommendations Home health PT    Equipment Recommendations  None recommended by PT    Recommendations for Other Services       Precautions / Restrictions Precautions Precautions: Fall;Other (comment) Precaution Comments: seizure Restrictions Weight Bearing Restrictions: No      Mobility  Bed Mobility Overal bed mobility: Independent                Transfers Overall transfer level: Needs assistance   Transfers: Sit to/from Stand Sit to Stand: Supervision         General transfer comment: supervision for safety  Ambulation/Gait Ambulation/Gait assistance: Min guard;Min assist Ambulation Distance (Feet): 90 Feet Assistive device: Rolling walker (2 wheeled) Gait Pattern/deviations: Step-through pattern;Decreased step length - right;Decreased step length - left   Gait velocity interpretation: at or above normal speed for age/gender General Gait Details: min A to manage RW with turns and for mild LOB x 2 (pt able to self correct)  Stairs            Wheelchair Mobility     Modified Rankin (Stroke Patients Only)       Balance Overall balance assessment: Needs assistance   Sitting balance-Leahy Scale: Good       Standing balance-Leahy Scale: Fair                               Pertinent Vitals/Pain Pain Assessment: Faces Pain Score:  (pt gave no response when asked to rate pain) Faces Pain Scale: Hurts little more Pain Location: back -pt thinks this is due to falling during seizure Pain Descriptors / Indicators: Aching Pain Intervention(s): Limited activity within patient's tolerance;Monitored during session;Premedicated before session    Home Living Family/patient expects to be discharged to:: Private residence Living Arrangements: Children Available Help at Discharge: Family;Available 24 hours/day Type of Home: Apartment Home Access: Elevator     Home Layout: One level Home Equipment: Walker - 2 wheels;Cane - single point      Prior Function Level of Independence: Independent with assistive device(s)         Comments: Pt unable to recall if her home has stairs to enter or stairs inside, poor historian, info above is from prior PT encounter 06/26/16. Pt stated she uses a cane then stated she uses a walker, reports prior orthopedic surgery to [facility], scar noted medial knee.      Hand Dominance   Dominant Hand: Right    Extremity/Trunk Assessment   Upper Extremity Assessment Upper Extremity Assessment: Overall WFL for tasks assessed    Lower Extremity Assessment Lower Extremity Assessment: Overall WFL for tasks assessed  Cervical / Trunk Assessment Cervical / Trunk Assessment: Normal  Communication   Communication: No difficulties  Cognition Arousal/Alertness: Awake/alert Behavior During Therapy: Impulsive Overall Cognitive Status: No family/caregiver present to determine baseline cognitive functioning                                 General Comments: pt oriented to self, location, date but  couldn't recall if her home has any stairs in it      General Comments      Exercises     Assessment/Plan    PT Assessment Patient needs continued PT services  PT Problem List Decreased activity tolerance;Decreased mobility;Pain;Decreased balance       PT Treatment Interventions DME instruction;Gait training;Functional mobility training;Therapeutic exercise;Patient/family education;Balance training;Therapeutic activities    PT Goals (Current goals can be found in the Care Plan section)  Acute Rehab PT Goals Patient Stated Goal: DC home PT Goal Formulation: With patient Time For Goal Achievement: 05/16/17 Potential to Achieve Goals: Good    Frequency Min 3X/week   Barriers to discharge        Co-evaluation               AM-PAC PT "6 Clicks" Daily Activity  Outcome Measure Difficulty turning over in bed (including adjusting bedclothes, sheets and blankets)?: None Difficulty moving from lying on back to sitting on the side of the bed? : None Difficulty sitting down on and standing up from a chair with arms (e.g., wheelchair, bedside commode, etc,.)?: A Little Help needed moving to and from a bed to chair (including a wheelchair)?: A Little Help needed walking in hospital room?: A Little Help needed climbing 3-5 steps with a railing? : A Little 6 Click Score: 20    End of Session Equipment Utilized During Treatment: Gait belt Activity Tolerance: Patient tolerated treatment well Patient left: in chair;with call bell/phone within reach;with chair alarm set Nurse Communication: Mobility status PT Visit Diagnosis: Unsteadiness on feet (R26.81);Difficulty in walking, not elsewhere classified (R26.2);Pain Pain - part of body:  (back)    Time: 8366-2947 PT Time Calculation (min) (ACUTE ONLY): 22 min   Charges:   PT Evaluation $PT Eval Low Complexity: 1 Low     PT G Codes:   PT G-Codes **NOT FOR INPATIENT CLASS** Functional Assessment Tool Used: AM-PAC 6 Clicks  Basic Mobility Functional Limitation: Mobility: Walking and moving around Mobility: Walking and Moving Around Current Status (M5465): At least 20 percent but less than 40 percent impaired, limited or restricted Mobility: Walking and Moving Around Goal Status [phone]): At least 1 percent but less than 20 percent impaired, limited or restricted      Philomena Doheny 05/02/2017, 11:30 AM [phone]

## 2017-05-02 NOTE — Clinical Social Work Note (Signed)
Clinical Social Work Assessment  Patient Details  Name: Lindsey [name] MRN: [id number] Date of Birth: November 16, 1955  Date of referral:  05/02/17               Reason for consult:  Substance Use/ETOH Abuse                Permission sought to share information with:  Chartered certified accountant granted to share information::  Yes, Verbal Permission Granted  Name::        Agency::     Relationship::     Contact Information:     Housing/Transportation Living arrangements for the past 2 months:  Single Family Home Source of Information:  Patient Patient Interpreter Needed:  None Criminal Activity/Legal Involvement Pertinent to Current Situation/Hospitalization:    Significant Relationships:  Siblings, Adult Children Lives with:  Adult Children Do you feel safe going back to the place where you live?  Yes Need for family participation in patient care:  No (Coment) Care giving concerns:  None listed by pt/family   Social Worker assessment / plan:  CSW met with pt and confirmed pt's plan to be discharged to her daughter's home to live at discharge.  CSW provided active listening and validated pt's concerns.   CSW met with pt to offer resources initally pt refused but as session progressed pt accepted  meeting schedules for area Alcoholics Anonymous and Narcotics Anonymous 12-Step meetings as well as inpatient/outpatient SA Tx resources.  CSW provided education to the pt as to the efficacy of 12-step programs for community support for those needing support in addition to or other than inpatient/outpatient treatment.  Pt appreciated CSW's efforts and thanked the CSW.  Alphonse Guild. Avi Archuleta, LCSWA, LCAS Clinical Social Worker Ph: [phone]  Pt has been living with her daughter, prior to being admitted to [facility].  Employment status:  Unemployed Forensic scientist:  Medicaid In [place] PT Recommendations:  Home with [place] / Referral to community resources:      Patient/Family's Response to care:  Patient alert and oriented.  Patient agreeable to plan.  Pt's daughter supportive and strongly involved in pt.'s care.  Pt pleasant and appreciated CSW intervention.    Patient/Family's Understanding of and Emotional Response to Diagnosis, Current Treatment, and Prognosis:  Still assessing  Emotional Assessment Appearance:  Appears stated age Attitude/Demeanor/Rapport:    Affect (typically observed):  Accepting, Adaptable, Anxious, Pleasant Orientation:  Oriented to Self, Oriented to Place, Oriented to  Time, Oriented to Situation Alcohol / Substance use:    Psych involvement (Current and /or in the community):     Discharge Needs  Concerns to be addressed:  No discharge needs identified Readmission within the last 30 days:  No Current discharge risk:  None Barriers to Discharge:  No Barriers Identified   Claudine Mouton, LCSWA 05/02/2017, 9:24 PM

## 2017-05-03 ENCOUNTER — Telehealth: Payer: Self-pay | Admitting: Neurology

## 2017-05-03 DIAGNOSIS — Z9119 Patient's noncompliance with other medical treatment and regimen: Secondary | ICD-10-CM

## 2017-05-03 LAB — CBC WITH DIFFERENTIAL/PLATELET
BASOS PCT: 0 %
Basophils Absolute: 0 10*3/uL (ref 0.0–0.1)
EOS ABS: 0.1 10*3/uL (ref 0.0–0.7)
Eosinophils Relative: 1 %
HEMATOCRIT: 37.6 % (ref 36.0–46.0)
Hemoglobin: 13 g/dL (ref 12.0–15.0)
LYMPHS ABS: 4.2 10*3/uL — AB (ref 0.7–4.0)
Lymphocytes Relative: 43 %
MCH: 30.7 pg (ref 26.0–34.0)
MCHC: 34.6 g/dL (ref 30.0–36.0)
MCV: 88.9 fL (ref 78.0–100.0)
Monocytes Absolute: 1.1 10*3/uL — ABNORMAL HIGH (ref 0.1–1.0)
Monocytes Relative: 12 %
NEUTROS ABS: 4.4 10*3/uL (ref 1.7–7.7)
Neutrophils Relative %: 44 %
Platelets: 333 10*3/uL (ref 150–400)
RBC: 4.23 MIL/uL (ref 3.87–5.11)
RDW: 13.5 % (ref 11.5–15.5)
WBC: 9.8 10*3/uL (ref 4.0–10.5)

## 2017-05-03 LAB — COMPREHENSIVE METABOLIC PANEL
ALBUMIN: 3.8 g/dL (ref 3.5–5.0)
ALK PHOS: 83 U/L (ref 38–126)
ALT: 14 U/L (ref 14–54)
AST: 21 U/L (ref 15–41)
Anion gap: 8 (ref 5–15)
BUN: 13 mg/dL (ref 6–20)
CALCIUM: 9.2 mg/dL (ref 8.9–10.3)
CO2: 26 mmol/L (ref 22–32)
CREATININE: 0.72 mg/dL (ref 0.44–1.00)
Chloride: 104 mmol/L (ref 101–111)
GFR calc Af Amer: >60 mL/min (ref >60)
GFR calc non Af Amer: >60 mL/min (ref >60)
GLUCOSE: 95 mg/dL (ref 65–99)
Potassium: 3.7 mmol/L (ref 3.5–5.1)
SODIUM: 138 mmol/L (ref 135–145)
Total Bilirubin: 0.7 mg/dL (ref 0.3–1.2)
Total Protein: 7.4 g/dL (ref 6.5–8.1)

## 2017-05-03 LAB — MAGNESIUM: Magnesium: 1.8 mg/dL (ref 1.7–2.4)

## 2017-05-03 LAB — LIPASE, BLOOD: Lipase: 29 U/L (ref 11–51)

## 2017-05-03 MED ORDER — LEVETIRACETAM 750 MG PO TABS: 750.0000 mg | ORAL_TABLET | Freq: Two times a day (BID) | ORAL | 0 refills | Status: DC

## 2017-05-03 MED ORDER — DIVALPROEX SODIUM 500 MG PO DR TAB: 500.0000 mg | DELAYED_RELEASE_TABLET | Freq: Two times a day (BID) | ORAL | 0 refills | Status: DC

## 2017-05-03 MED ORDER — THIAMINE HCL 100 MG PO TABS: 100.0000 mg | ORAL_TABLET | Freq: Every day | ORAL | 0 refills | Status: DC

## 2017-05-03 NOTE — Care Management Note (Signed)
Case Management Note  Patient Details  Name: Lindsey [name] MRN: [id number] Date of Birth: 12/27/1955  Subjective/Objective:    60 yo admitted with Seizures. Hx of seizure disorder, polysubstance abuse                Action/Plan: Pt plan to dc home with daughter. PT recommended HHPT. Pt unable to receive HHPT due to having Medicaid.  Pt offered choice for [facility] and chose [facility] home [facility] services. [facility] rep contacted for referral. Pt has RW at home.   Expected Discharge Date:  05/03/17               Expected Discharge Plan:  [place]  In-House Referral:  Clinical Social Work  Discharge planning Services  CM Consult  Post Acute Care Choice:  Home Health Choice offered to:  Patient  DME Arranged:    DME Agency:     HH Arranged:  RN [place] Agency:  [facility]  Status of Service:  In process, will continue to follow  If discussed at Long Length of Stay Meetings, dates discussed:    Additional CommentsLynnell Catalan, RN 05/03/2017, 10:31 AM  [phone]

## 2017-05-03 NOTE — Telephone Encounter (Signed)
Drew with [facility] called office in reference to patient being discharged from hospital today and needing to see if patient is able to have nursing home orders signed.  Please call

## 2017-05-03 NOTE — Discharge Summary (Signed)
Physician Discharge Summary  Monai Hindes HDQ:[id number] DOB: 1956/08/03 DOA: 05/01/2017  PCP: [name], MD  Admit date: 05/01/2017 Discharge date: 05/03/2017  Admitted From:Home Disposition:  Home  Recommendations for Outpatient Follow-up:  1. Follow up with PCP in 1 week with repeat BMP/magnesium  Patient will benefit from outpatient follow-up with neurology  Home Health: Yes  Equipment/Devices: None  Discharge Condition: Stable  CODE STATUS: Full  Diet recommendation: Heart Healthy  Brief/Interim Summary: 61 year old female with history of seizure disorder, polysubstance abuse, noncompliance and mild intermittent asthma presented with 3 seizures at home. She was admitted with seizures probably secondary to alcohol/benzodiazepine withdrawal. Patient has not had any more seizure episode during hospitalization. She was restarted on Keppra and Depakote that apparently she is supposed to take at home. She needs to be abstinent from alcohol.  Discharge Diagnoses:  Principal Problem:   Seizures ([place]) Active Problems:   Seizure ([place])   Alcohol abuse   Polysubstance abuse   Seizure disorder ([place])   Asthma   Patient's noncompliance with other medical treatment and regimen   1. Seizures  - Likely secondary to subtherapeutic antiepileptic levels, versus possible alcohol or benzodiazepine withdrawal  - Seizure free since hospitalization.  - Patient needs to be compliant with her medications. Continue with Depakote and Keppra at home. Patient might benefit from outpatient follow-up with neurology  - Neurology consulted by ED provider who recommended resumption of home medications  - Outpatient follow-up with primary care provider and/or neurology  2. Polysubstance abuse  - UDS is positive for benzodiazepines and tetrahydrocannabinol. Counseled about cessation of substance abuse and alcohol abstinence.  -Currently on CIWA protocol - Continue thiamine, folate and  multivitamins  3. Asthma  - Stable - Continue prn albuterol   4. Hypertension  -Resume home amlodipine. Comply with medications. Outpatient follow-up  5. Abdominal pain - Improved. Probably from gastritis from alcohol use. Continue Protonix  6. One episode of fever - Questionable cause. Might be from withdrawal. Afebrile since then. Monitor off antibiotics.  Discharge Instructions  Discharge Instructions    Call MD for:  difficulty breathing, headache or visual disturbances    Complete by:  As directed    Call MD for:  extreme fatigue    Complete by:  As directed    Call MD for:  hives    Complete by:  As directed    Call MD for:  persistant dizziness or light-headedness    Complete by:  As directed    Call MD for:  persistant nausea and vomiting    Complete by:  As directed    Call MD for:  temperature >100.4    Complete by:  As directed    Diet - low sodium heart healthy    Complete by:  As directed    Increase activity slowly    Complete by:  As directed      Allergies as of 05/03/2017      Reactions   Kiwi Extract Hives, Itching   Morphine And Related Hives   Penicillins Hives, Itching   Has patient had a PCN reaction causing immediate rash, facial/tongue/throat swelling, SOB or lightheadedness with hypotension: Yes Has patient had a PCN reaction causing severe rash involving mucus membranes or skin necrosis: No Has patient had a PCN reaction that required hospitalization No Has patient had a PCN reaction occurring within the last 10 years: No If all of the above answers are "NO", then may proceed with Cephalosporin use.   Strawberry Extract Hives,  Itching      Medication List    STOP taking these medications   acetaminophen-codeine 300-30 MG tablet Commonly known as:  TYLENOL #3   hydrOXYzine 25 MG tablet Commonly known as:  ATARAX/VISTARIL   nicotine 21 mg/24hr patch Commonly known as:  NICODERM CQ - dosed in mg/24 hours     TAKE these  medications   acetaminophen 500 MG tablet Commonly known as:  TYLENOL Take 500 mg by mouth every 8 (eight) hours as needed. What changed:  Another medication with the same name was removed. Continue taking this medication, and follow the directions you see here.   amLODipine 5 MG tablet Commonly known as:  NORVASC Take 1 tablet (5 mg total) by mouth daily.   cholecalciferol 1000 units tablet Commonly known as:  VITAMIN D Take 1,000 Units by mouth daily.   diclofenac sodium 1 % Gel Commonly known as:  VOLTAREN Apply 2 g topically daily as needed (arthritis pain).   divalproex 500 MG DR tablet Commonly known as:  DEPAKOTE Take 1 tablet (500 mg total) by mouth 2 (two) times daily.   folic acid 1 MG tablet Commonly known as:  FOLVITE Take 1 mg by mouth daily.   levETIRAcetam 750 MG tablet Commonly known as:  KEPPRA Take 1 tablet (750 mg total) by mouth 2 (two) times daily.   LORazepam 1 MG tablet Commonly known as:  ATIVAN Take 1 mg by mouth every 8 (eight) hours as needed for anxiety.   magnesium oxide 400 MG tablet Commonly known as:  MAG-OX Take 400 mg by mouth 3 (three) times daily.   multivitamin tablet Take 1 tablet by mouth daily.   pantoprazole 40 MG tablet Commonly known as:  PROTONIX Take 1 tablet (40 mg total) by mouth 2 (two) times daily.   PROAIR HFA 108 (90 Base) MCG/ACT inhaler Generic drug:  albuterol Inhale 2 puffs into the lungs every 6 (six) hours as needed for wheezing or shortness of breath.   albuterol (2.5 MG/3ML) 0.083% nebulizer solution Commonly known as:  PROVENTIL Take 2.5 mg by nebulization every 6 (six) hours as needed for wheezing or shortness of breath.   thiamine 100 MG tablet Take 1 tablet (100 mg total) by mouth daily. What changed:  how much to take       [place], [facility] Follow up.   Specialty:  Home Health Services Why:  For home health nurse. They will call you for an  appointment Contact information: [address] [place] [address] [phone]          Allergies  Allergen Reactions  . Kiwi Extract Hives and Itching  . Morphine And Related Hives  . Penicillins Hives and Itching    Has patient had a PCN reaction causing immediate rash, facial/tongue/throat swelling, SOB or lightheadedness with hypotension: Yes Has patient had a PCN reaction causing severe rash involving mucus membranes or skin necrosis: No Has patient had a PCN reaction that required hospitalization No Has patient had a PCN reaction occurring within the last 10 years: No If all of the above answers are "NO", then may proceed with Cephalosporin use.  . Strawberry Extract Hives and Itching    Consultations:  None   Procedures/Studies: Ct Head Wo Contrast  Result Date: 05/01/2017 CLINICAL DATA:  61 y/o F; 3 witnessed seizures. Altered mental status. EXAM: CT HEAD WITHOUT CONTRAST TECHNIQUE: Contiguous axial images were obtained from the base of the skull  through the vertex without intravenous contrast. COMPARISON:  06/24/2016 CT of the head. FINDINGS: Brain: No evidence of acute infarction, hemorrhage, hydrocephalus, extra-axial collection or mass lesion/mass effect. Stable mild chronic microvascular ischemic changes and mild parenchymal volume loss of the brain. Vascular: Mild calcific atherosclerosis of carotid siphons. No hyperdense vessel. Skull: Normal. Negative for fracture or focal lesion. Sinuses/Orbits: No acute finding. Other: None. IMPRESSION: 1. No acute intracranial abnormality identified. 2. Stable mild chronic microvascular ischemic changes and mild parenchymal volume loss of the brain. Electronically Signed   By: Kristine Garbe M.D.   On: 05/01/2017 17:34      Subjective: Patient seen and examined at bedside. she feels better and wants to go home. No overnight fever, nausea or vomiting.  Discharge Exam: Vitals:   05/02/17 2120  05/03/17 0511  BP: (!) 166/83 (!) 161/91  Pulse: 92 81  Resp: 18 18  Temp: 97.8 F (36.6 C) 98.1 F (36.7 C)   Vitals:   05/02/17 0556 05/02/17 1400 05/02/17 2120 05/03/17 0511  BP: 140/79 135/86 (!) 166/83 (!) 161/91  Pulse: 96 (!) 103 92 81  Resp: 16 16 18 18   Temp: 98.4 F (36.9 C) 98.3 F (36.8 C) 97.8 F (36.6 C) 98.1 F (36.7 C)  TempSrc: Oral Oral Oral Oral  SpO2:  100% 96% 98%  Weight: 67.6 kg (149 lb)     Height:        General: Pt is alert, awake, not in acute distress Cardiovascular: RRR, S1/S2 + Respiratory: Bilateral decreased breath sounds bases Abdominal: Soft, NT, ND, bowel sounds + Extremities: no edema, no cyanosis    The results of significant diagnostics from this hospitalization (including imaging, microbiology, ancillary and laboratory) are listed below for reference.     Microbiology: No results found for this or any previous visit (from the past 240 hour(s)).   Labs: BNP (last 3 results) No results for input(s): BNP in the last 8760 hours. Basic Metabolic Panel:  Recent Labs Lab 05/01/17 1635 05/01/17 2125 05/02/17 0425 05/03/17 0408  NA 144  --  141 138  K 4.7  --  3.4* 3.7  CL 106  --  109 104  CO2 27  --  24 26  GLUCOSE 102*  --  124* 95  BUN 17  --  15 13  CREATININE 0.78  --  0.76 0.72  CALCIUM 9.6  --  8.8* 9.2  MG  --  1.8  --  1.8   Liver Function Tests:  Recent Labs Lab 05/01/17 1635 05/03/17 0408  AST 26 21  ALT 16 14  ALKPHOS 106 83  BILITOT 0.6 0.7  PROT 8.5* 7.4  ALBUMIN 4.4 3.8    Recent Labs Lab 05/03/17 0408  LIPASE 29   No results for input(s): AMMONIA in the last 168 hours. CBC:  Recent Labs Lab 05/01/17 1635 05/02/17 0425 05/03/17 0408  WBC 12.2* 9.3 9.8  NEUTROABS 10.2*  --  4.4  HGB 14.3 12.6 13.0  HCT 41.1 36.3 37.6  MCV 89.0 86.8 88.9  PLT 422* 330 333   Cardiac Enzymes: No results for input(s): CKTOTAL, CKMB, CKMBINDEX, TROPONINI in the last 168 hours. BNP: Invalid input(s):  POCBNP CBG: No results for input(s): GLUCAP in the last 168 hours. D-Dimer No results for input(s): DDIMER in the last 72 hours. Hgb A1c No results for input(s): HGBA1C in the last 72 hours. Lipid Profile No results for input(s): CHOL, HDL, LDLCALC, TRIG, CHOLHDL, LDLDIRECT in the last 72 hours. Thyroid  function studies No results for input(s): TSH, T4TOTAL, T3FREE, THYROIDAB in the last 72 hours.  Invalid input(s): FREET3 Anemia work up No results for input(s): VITAMINB12, FOLATE, FERRITIN, TIBC, IRON, RETICCTPCT in the last 72 hours. Urinalysis    Component Value Date/Time   COLORURINE YELLOW 05/01/2017 1556   APPEARANCEUR CLEAR 05/01/2017 1556   APPEARANCEUR Clear 01/28/2014 1937   LABSPEC 1.017 05/01/2017 1556   LABSPEC 1.015 01/28/2014 1937   PHURINE 5.0 05/01/2017 1556   GLUCOSEU NEGATIVE 05/01/2017 1556   GLUCOSEU Negative 01/28/2014 1937   HGBUR MODERATE (A) 05/01/2017 1556   BILIRUBINUR NEGATIVE 05/01/2017 1556   BILIRUBINUR Negative 01/28/2014 1937   KETONESUR 20 (A) 05/01/2017 1556   PROTEINUR NEGATIVE 05/01/2017 1556   UROBILINOGEN 0.2 03/01/2015 1935   NITRITE NEGATIVE 05/01/2017 1556   LEUKOCYTESUR NEGATIVE 05/01/2017 1556   LEUKOCYTESUR Negative 01/28/2014 1937   Sepsis Labs Invalid input(s): PROCALCITONIN,  WBC,  LACTICIDVEN Microbiology No results found for this or any previous visit (from the past 240 hour(s)).   Time coordinating discharge: 35 minutes  SIGNED:   Aline August, MD  [facility] 05/03/2017, 10:28 AM Pager: [phone]  If 7PM-7AM, please contact night-coverage [email or web address] Password [email or web address]

## 2017-05-03 NOTE — Progress Notes (Signed)
CSW called to assist with identifying plan for transportation home.  Met with pt and pt's daughter on phone -states pt's brother will be able to pick pt up this evening 6pm, and family prefers this rather than pt taking public transportation or calling taxi. Spoke with pt's brother via phone as well, he confirms plan to pick pt up.  Sharren Bridge, MSW, LCSW Clinical Social Work 05/03/2017 [phone]

## 2017-05-04 NOTE — Telephone Encounter (Signed)
Dr. Jannifer Franklin see staff message.

## 2017-05-04 NOTE — Telephone Encounter (Signed)
I called and talked with Dian Situ. I'll be happy to sign the home health orders, apparently primary care physician is unable to do this.

## 2017-05-07 LAB — CULTURE, BLOOD (ROUTINE X 2)
Culture: NO GROWTH
Culture: NO GROWTH
SPECIAL REQUESTS: ADEQUATE
Special Requests: ADEQUATE

## 2017-05-09 NOTE — Telephone Encounter (Signed)
I called Lottie, okay to start home health services tomorrow.

## 2017-05-09 NOTE — Telephone Encounter (Signed)
Lottie with [facility] is callling to see if home health can be started tomorrow for the patient.

## 2017-05-31 ENCOUNTER — Ambulatory Visit: Payer: Medicaid Other | Admitting: Adult Health

## 2017-08-31 ENCOUNTER — Ambulatory Visit: Payer: Self-pay | Admitting: Adult Health

## 2017-09-01 ENCOUNTER — Encounter: Payer: Self-pay | Admitting: Adult Health

## 2017-09-02 ENCOUNTER — Telehealth: Payer: Self-pay | Admitting: Adult Health

## 2017-09-02 NOTE — Telephone Encounter (Signed)
Pt has no showed to 3 apts.

## 2017-09-02 NOTE — Telephone Encounter (Signed)
Please dismiss from practice d/t no show policy.

## 2017-09-08 ENCOUNTER — Encounter: Payer: Self-pay | Admitting: Neurology

## 2018-01-29 ENCOUNTER — Emergency Department (HOSPITAL_COMMUNITY)
Admission: EM | Admit: 2018-01-29 | Discharge: 2018-01-29 | Disposition: A | Discharge status: Home / Self Care | Payer: Medicaid Other | Attending: Emergency Medicine | Admitting: Emergency Medicine

## 2018-01-29 ENCOUNTER — Encounter (HOSPITAL_COMMUNITY): Payer: Self-pay | Admitting: Emergency Medicine

## 2018-01-29 DIAGNOSIS — Z79899 Other long term (current) drug therapy: Secondary | ICD-10-CM | POA: Diagnosis not present

## 2018-01-29 DIAGNOSIS — J45909 Unspecified asthma, uncomplicated: Secondary | ICD-10-CM | POA: Diagnosis not present

## 2018-01-29 DIAGNOSIS — F1010 Alcohol abuse, uncomplicated: Secondary | ICD-10-CM | POA: Diagnosis not present

## 2018-01-29 DIAGNOSIS — F17210 Nicotine dependence, cigarettes, uncomplicated: Secondary | ICD-10-CM | POA: Diagnosis not present

## 2018-01-29 DIAGNOSIS — Z9119 Patient's noncompliance with other medical treatment and regimen: Secondary | ICD-10-CM | POA: Diagnosis not present

## 2018-01-29 DIAGNOSIS — R569 Unspecified convulsions: Secondary | ICD-10-CM | POA: Diagnosis not present

## 2018-01-29 DIAGNOSIS — Z91199 Patient's noncompliance with other medical treatment and regimen due to unspecified reason: Secondary | ICD-10-CM

## 2018-01-29 LAB — BASIC METABOLIC PANEL
ANION GAP: 7 (ref 5–15)
BUN: 17 mg/dL (ref 6–20)
CO2: 22 mmol/L (ref 22–32)
CREATININE: 0.62 mg/dL (ref 0.44–1.00)
Calcium: 8.5 mg/dL — ABNORMAL LOW (ref 8.9–10.3)
Chloride: 110 mmol/L (ref 101–111)
GLUCOSE: 94 mg/dL (ref 65–99)
Potassium: 3.6 mmol/L (ref 3.5–5.1)
Sodium: 139 mmol/L (ref 135–145)

## 2018-01-29 LAB — COMPREHENSIVE METABOLIC PANEL
ALT: 11 U/L — AB (ref 14–54)
ANION GAP: 14 (ref 5–15)
AST: 17 U/L (ref 15–41)
Albumin: 2.6 g/dL — ABNORMAL LOW (ref 3.5–5.0)
Alkaline Phosphatase: 21 U/L — ABNORMAL LOW (ref 38–126)
BILIRUBIN TOTAL: 0.9 mg/dL (ref 0.3–1.2)
BUN: 15 mg/dL (ref 6–20)
CO2: 17 mmol/L — AB (ref 22–32)
CREATININE: 0.53 mg/dL (ref 0.44–1.00)
Calcium: <4 mg/dL — CL (ref 8.9–10.3)
Chloride: 110 mmol/L (ref 101–111)
GFR calc non Af Amer: >60 mL/min (ref >60)
GLUCOSE: 80 mg/dL (ref 65–99)
Potassium: >7.5 mmol/L (ref 3.5–5.1)
Sodium: 141 mmol/L (ref 135–145)
TOTAL PROTEIN: 5.1 g/dL — AB (ref 6.5–8.1)

## 2018-01-29 LAB — VALPROIC ACID LEVEL

## 2018-01-29 LAB — CBC WITH DIFFERENTIAL/PLATELET
Basophils Absolute: 0 10*3/uL (ref 0.0–0.1)
Basophils Relative: 0 %
Eosinophils Absolute: 0.1 10*3/uL (ref 0.0–0.7)
Eosinophils Relative: 1 %
HEMATOCRIT: 39 % (ref 36.0–46.0)
Hemoglobin: 13 g/dL (ref 12.0–15.0)
LYMPHS ABS: 1.6 10*3/uL (ref 0.7–4.0)
Lymphocytes Relative: 27 %
MCH: 29.9 pg (ref 26.0–34.0)
MCHC: 33.3 g/dL (ref 30.0–36.0)
MCV: 89.7 fL (ref 78.0–100.0)
MONOS PCT: 12 %
Monocytes Absolute: 0.7 10*3/uL (ref 0.1–1.0)
NEUTROS ABS: 3.7 10*3/uL (ref 1.7–7.7)
NEUTROS PCT: 60 %
Platelets: 340 10*3/uL (ref 150–400)
RBC: 4.35 MIL/uL (ref 3.87–5.11)
RDW: 14.2 % (ref 11.5–15.5)
WBC: 6 10*3/uL (ref 4.0–10.5)

## 2018-01-29 LAB — ETHANOL: Alcohol, Ethyl (B): <10 mg/dL (ref <10)

## 2018-01-29 MED ORDER — THIAMINE HCL 100 MG/ML IJ SOLN
Freq: Once | INTRAVENOUS | Status: AC
  Administered 2018-01-29: 05:00:00 via INTRAVENOUS
  Filled 2018-01-29: qty 1000

## 2018-01-29 MED ORDER — CALCIUM CITRATE 950 (200 CA) MG PO TABS
200.0000 mg | ORAL_TABLET | Freq: Every day | ORAL | Status: DC
  Filled 2018-01-29: qty 1

## 2018-01-29 MED ORDER — CALCIUM CARBONATE ANTACID 500 MG PO CHEW: 1.0000 | CHEWABLE_TABLET | Freq: Every day | ORAL | Status: DC

## 2018-01-29 MED ORDER — DIVALPROEX SODIUM 500 MG PO DR TAB: 500.0000 mg | DELAYED_RELEASE_TABLET | Freq: Two times a day (BID) | ORAL | 0 refills | Status: DC

## 2018-01-29 MED ORDER — LEVETIRACETAM 500 MG PO TABS
750.0000 mg | ORAL_TABLET | Freq: Once | ORAL | Status: AC
  Administered 2018-01-29: 750 mg via ORAL
  Filled 2018-01-29: qty 1

## 2018-01-29 MED ORDER — LEVETIRACETAM 750 MG PO TABS: 750.0000 mg | ORAL_TABLET | Freq: Two times a day (BID) | ORAL | 0 refills | Status: DC

## 2018-01-29 MED ORDER — DIVALPROEX SODIUM 500 MG PO DR TAB
500.0000 mg | DELAYED_RELEASE_TABLET | Freq: Once | ORAL | Status: AC
  Administered 2018-01-29: 500 mg via ORAL
  Filled 2018-01-29: qty 1

## 2018-01-29 MED ORDER — CALCIUM CITRATE-VITAMIN D 250-100 MG-UNIT PO TABS: 1.0000 | ORAL_TABLET | Freq: Two times a day (BID) | ORAL | 0 refills | Status: DC

## 2018-01-29 NOTE — ED Triage Notes (Signed)
Patient arrives by [facility] with complaints of seizure activity. Patient has hx Epilepsy and stopped taking her seizure medications and patient has been drinking heavily tonight. Daughter called EMS tonight. Daughter states 4 "back to back seizures" lasting 3 minutes each. EMS unable to obtain IV access.

## 2018-01-29 NOTE — ED Notes (Signed)
Bed: WA25 Expected date:  Expected time:  Means of arrival:  Comments: EMS seizure 

## 2018-01-29 NOTE — ED Provider Notes (Addendum)
[facility] DEPT Provider Note: [name], MD, FACEP  CSN: [id number] MRN: [id number] ARRIVAL: 01/29/18 at [place]: [place]  Seizures  Level 5 caveat: Postictal; intoxicated HISTORY OF PRESENT ILLNESS  01/29/18 2:55 AM Lindsey [name] is a 62 y.o. female with a history of seizure disorder, noncompliant with medications, and alcohol abuse.  She is here after a daughter witnessed for "back to back" seizures lasting about 3 minutes each.  The daughter states she had been drinking heavily.  She is postictal on arrival unable to give a history.   Past Medical History:  Diagnosis Date  . Asthma   . H/O ETOH abuse   . Seizure [facility])     Past Surgical History:  Procedure Laterality Date  . ABDOMINAL HYSTERECTOMY    . GALLBLADDER SURGERY      Family History  Problem Relation Age of Onset  . Seizures Mother   . Alcohol abuse Mother   . Heart attack Father   . Drug abuse Brother   . Cancer Brother        Pancreatic    Social History   Tobacco Use  . Smoking status: Current Some Day Smoker    Types: Cigarettes  . Smokeless tobacco: Never Used  Substance Use Topics  . Alcohol use: No    Comment: Hx of alchol abuse, not currently drinking  . Drug use: Yes    Types: Marijuana    Comment: "every now and then"    Prior to Admission medications   Medication Sig Start Date End Date Taking? Authorizing Provider  acetaminophen (TYLENOL) 500 MG tablet Take 500 mg by mouth every 8 (eight) hours as needed.    [provider]  albuterol (PROAIR HFA) 108 (90 Base) MCG/ACT inhaler Inhale 2 puffs into the lungs every 6 (six) hours as needed for wheezing or shortness of breath.    [provider]  albuterol (PROVENTIL) (2.5 MG/3ML) 0.083% nebulizer solution Take 2.5 mg by nebulization every 6 (six) hours as needed for wheezing or shortness of breath.    [provider]  amLODipine (NORVASC) 5 MG tablet Take 1 tablet (5 mg total) by  mouth daily. Patient not taking: Reported on 07/29/2016 06/27/16   Theodoro Grist, MD  cholecalciferol (VITAMIN D) 1000 units tablet Take 1,000 Units by mouth daily.    [provider]  diclofenac sodium (VOLTAREN) 1 % GEL Apply 2 g topically daily as needed (arthritis pain).    [provider]  divalproex (DEPAKOTE) 500 MG DR tablet Take 1 tablet (500 mg total) by mouth 2 (two) times daily. 05/03/17   Aline August, MD  folic acid (FOLVITE) 1 MG tablet Take 1 mg by mouth daily.    [provider]  levETIRAcetam (KEPPRA) 750 MG tablet Take 1 tablet (750 mg total) by mouth 2 (two) times daily. 05/03/17   Aline August, MD  LORazepam (ATIVAN) 1 MG tablet Take 1 mg by mouth every 8 (eight) hours as needed for anxiety.    [provider]  magnesium oxide (MAG-OX) 400 MG tablet Take 400 mg by mouth 3 (three) times daily.    [provider]  Multiple Vitamin (MULTIVITAMIN) tablet Take 1 tablet by mouth daily.    [provider]  pantoprazole (PROTONIX) 40 MG tablet Take 1 tablet (40 mg total) by mouth 2 (two) times daily. 08/19/15   Reyne Dumas, MD  thiamine 100 MG tablet Take 1 tablet (100 mg total) by mouth daily. 05/03/17  Aline August, MD    Allergies Kiwi extract; Morphine and related; Penicillins; and Strawberry extract   REVIEW OF SYSTEMS     PHYSICAL EXAMINATION  Initial Vital Signs Blood pressure (!) 163/84, pulse 82, temperature 98.9 F (37.2 C), temperature source Oral, SpO2 100 %.  Examination General: Well-developed, well-nourished female in no acute distress; appearance consistent with age of record HENT: normocephalic; atraumatic Eyes: pupils equal, round and reactive to light; extraocular muscles intact; arcus senilis bilaterally Neck: supple Heart: regular rate and rhythm Lungs: clear to auscultation bilaterally Abdomen: soft; nondistended; nontender; bowel sounds present Extremities: No deformity; full range of  motion; pulses normal Neurologic: Awake, alert and oriented x 2; motor function intact in all extremities and symmetric; no facial droop Skin: Warm and dry Psychiatric: Normal mood and affect   RESULTS  Summary of this visit's results, reviewed by myself:   EKG Interpretation  Date/Time:  Sunday Jan 29 2018 05:35:58 EDT Ventricular Rate:  80 PR Interval:    QRS Duration: 89 QT Interval:  428 QTC Calculation: 023 R Axis:   75 Text Interpretation:  Sinus rhythm Left ventricular hypertrophy Previously irregular rhythm Confirmed by Domino Holten [phone]) on 01/29/2018 5:53:52 AM      Laboratory Studies: Results for orders placed or performed during the hospital encounter of 01/29/18 (from the past 24 hour(s))  CBC with Differential/Platelet     Status: None   Collection Time: 01/29/18  3:46 AM  Result Value Ref Range   WBC 6.0 4.0 - 10.5 K/uL   RBC 4.35 3.87 - 5.11 MIL/uL   Hemoglobin 13.0 12.0 - 15.0 g/dL   HCT 39.0 36.0 - 46.0 %   MCV 89.7 78.0 - 100.0 fL   MCH 29.9 26.0 - 34.0 pg   MCHC 33.3 30.0 - 36.0 g/dL   RDW 14.2 11.5 - 15.5 %   Platelets 340 150 - 400 K/uL   Neutrophils Relative % 60 %   Neutro Abs 3.7 1.7 - 7.7 K/uL   Lymphocytes Relative 27 %   Lymphs Abs 1.6 0.7 - 4.0 K/uL   Monocytes Relative 12 %   Monocytes Absolute 0.7 0.1 - 1.0 K/uL   Eosinophils Relative 1 %   Eosinophils Absolute 0.1 0.0 - 0.7 K/uL   Basophils Relative 0 %   Basophils Absolute 0.0 0.0 - 0.1 K/uL  Comprehensive metabolic panel     Status: Abnormal   Collection Time: 01/29/18  4:20 AM  Result Value Ref Range   Sodium 141 135 - 145 mmol/L   Potassium >7.5 (HH) 3.5 - 5.1 mmol/L   Chloride 110 101 - 111 mmol/L   CO2 17 (L) 22 - 32 mmol/L   Glucose, Bld 80 65 - 99 mg/dL   BUN 15 6 - 20 mg/dL   Creatinine, Ser 0.53 0.44 - 1.00 mg/dL   Calcium <4.0 (LL) 8.9 - 10.3 mg/dL   Total Protein 5.1 (L) 6.5 - 8.1 g/dL   Albumin 2.6 (L) 3.5 - 5.0 g/dL   AST 17 15 - 41 U/L   ALT 11 (L) 14 - 54 U/L     Alkaline Phosphatase 21 (L) 38 - 126 U/L   Total Bilirubin 0.9 0.3 - 1.2 mg/dL   GFR calc non Af Amer >60 >60 mL/min   GFR calc Af Amer >60 >60 mL/min   Anion gap 14 5 - 15  Ethanol     Status: None   Collection Time: 01/29/18  4:21 AM  Result Value Ref Range  Alcohol, Ethyl (B) <10 <10 mg/dL  Valproic acid level     Status: Abnormal   Collection Time: 01/29/18  4:21 AM  Result Value Ref Range   Valproic Acid Lvl <10 (L) 50.0 - 100.0 ug/mL  Basic metabolic panel     Status: Abnormal   Collection Time: 01/29/18  5:52 AM  Result Value Ref Range   Sodium 139 135 - 145 mmol/L   Potassium 3.6 3.5 - 5.1 mmol/L   Chloride 110 101 - 111 mmol/L   CO2 22 22 - 32 mmol/L   Glucose, Bld 94 65 - 99 mg/dL   BUN 17 6 - 20 mg/dL   Creatinine, Ser 0.62 0.44 - 1.00 mg/dL   Calcium 8.5 (L) 8.9 - 10.3 mg/dL   GFR calc non Af Amer >60 >60 mL/min   GFR calc Af Amer >60 >60 mL/min   Anion gap 7 5 - 15   Imaging Studies: No results found.  ED COURSE and MDM  Nursing notes and initial vitals signs, including pulse oximetry, reviewed.  Vitals:   01/29/18 0253 01/29/18 0351 01/29/18 0353 01/29/18 0615  BP:  (!) 163/84  140/75  Pulse:  82  84  Resp:    16  Temp:   98.9 F (37.2 C)   TempSrc:   Oral   SpO2: 100% 100%  100%   3:58 AM She is now awake and alert and able to give a better history.  She admits to drinking heavily recently due to despondency over a death in the family.  She cannot tell me exactly which relative died however.  She admits to being noncompliant with her antiseizure medications.  She is complaining of hurting all over which she attributes to the seizure.  7:10 AM Be met rechecked after suspicious calcium and potassium levels were reported.  Her potassium is in fact normal and calcium only slightly below normal.  Patient's care discussed with her daughter at patient's request.  We will refill her anti-epileptic medications and also prescribe a calcium  supplement.  PROCEDURES   CRITICAL CARE Performed by: Shanon Rosser L Total critical care time: 30 minutes Critical care time was exclusive of separately billable procedures and treating other patients. Critical care was necessary to treat or prevent imminent or life-threatening deterioration. Critical care was time spent personally by me on the following activities: development of treatment plan with patient and/or surrogate as well as nursing, discussions with consultants, evaluation of patient's response to treatment, examination of patient, obtaining history from patient or surrogate, ordering and performing treatments and interventions, ordering and review of laboratory studies, ordering and review of radiographic studies, pulse oximetry and re-evaluation of patient's condition.   ED DIAGNOSES     ICD-10-CM   1. Seizures ([place]) R56.9   2. Alcohol abuse F10.10   3. Hypocalcemia E83.51   4. Noncompliance Z91.19        Charnee Turnipseed, Jenny Reichmann, MD 01/29/18 0720    Shanon Rosser, MD 02/11/18 [phone]

## 2018-01-29 NOTE — ED Notes (Addendum)
Date and time results received: 01/29/18 0528 (use smartphrase ".now" to insert current time)  Test:calcium Critical Value: 4.0>  Test: K+ Critical Value: 7.5<  Name of Provider Notified: Dr. Florina Ou Orders Received? Or Actions Taken?: Orders Received - See Orders for details

## 2018-01-29 NOTE — ED Notes (Signed)
Attempted to reach patient's family after patient stated that she did not have a way home. Charge nurse aware. Pt will be given cab voucher upon discharge.

## 2018-03-25 ENCOUNTER — Encounter (HOSPITAL_COMMUNITY): Payer: Self-pay

## 2018-03-25 ENCOUNTER — Inpatient Hospital Stay (HOSPITAL_COMMUNITY)
Admission: EM | Admit: 2018-03-25 | Discharge: 2018-03-30 | DRG: 100 | Disposition: A | Discharge status: Home / Self Care | Payer: Medicaid Other | Attending: Internal Medicine | Admitting: Internal Medicine

## 2018-03-25 ENCOUNTER — Other Ambulatory Visit: Payer: Self-pay

## 2018-03-25 DIAGNOSIS — I10 Essential (primary) hypertension: Secondary | ICD-10-CM | POA: Diagnosis present

## 2018-03-25 DIAGNOSIS — Z79899 Other long term (current) drug therapy: Secondary | ICD-10-CM

## 2018-03-25 DIAGNOSIS — F10939 Alcohol use, unspecified with withdrawal, unspecified: Secondary | ICD-10-CM

## 2018-03-25 DIAGNOSIS — Z6825 Body mass index (BMI) 25.0-25.9, adult: Secondary | ICD-10-CM

## 2018-03-25 DIAGNOSIS — Z9119 Patient's noncompliance with other medical treatment and regimen: Secondary | ICD-10-CM

## 2018-03-25 DIAGNOSIS — D649 Anemia, unspecified: Secondary | ICD-10-CM

## 2018-03-25 DIAGNOSIS — F10239 Alcohol dependence with withdrawal, unspecified: Secondary | ICD-10-CM | POA: Diagnosis present

## 2018-03-25 DIAGNOSIS — F1011 Alcohol abuse, in remission: Secondary | ICD-10-CM | POA: Diagnosis present

## 2018-03-25 DIAGNOSIS — Z90710 Acquired absence of both cervix and uterus: Secondary | ICD-10-CM

## 2018-03-25 DIAGNOSIS — F1910 Other psychoactive substance abuse, uncomplicated: Secondary | ICD-10-CM | POA: Diagnosis present

## 2018-03-25 DIAGNOSIS — K5900 Constipation, unspecified: Secondary | ICD-10-CM | POA: Diagnosis present

## 2018-03-25 DIAGNOSIS — Z811 Family history of alcohol abuse and dependence: Secondary | ICD-10-CM

## 2018-03-25 DIAGNOSIS — Z720 Tobacco use: Secondary | ICD-10-CM | POA: Diagnosis present

## 2018-03-25 DIAGNOSIS — F10231 Alcohol dependence with withdrawal delirium: Secondary | ICD-10-CM | POA: Diagnosis present

## 2018-03-25 DIAGNOSIS — R269 Unspecified abnormalities of gait and mobility: Secondary | ICD-10-CM | POA: Diagnosis present

## 2018-03-25 DIAGNOSIS — F17210 Nicotine dependence, cigarettes, uncomplicated: Secondary | ICD-10-CM | POA: Diagnosis present

## 2018-03-25 DIAGNOSIS — N390 Urinary tract infection, site not specified: Secondary | ICD-10-CM | POA: Diagnosis present

## 2018-03-25 DIAGNOSIS — G40909 Epilepsy, unspecified, not intractable, without status epilepticus: Principal | ICD-10-CM | POA: Diagnosis present

## 2018-03-25 DIAGNOSIS — E43 Unspecified severe protein-calorie malnutrition: Secondary | ICD-10-CM | POA: Diagnosis present

## 2018-03-25 DIAGNOSIS — F10931 Alcohol use, unspecified with withdrawal delirium: Secondary | ICD-10-CM

## 2018-03-25 DIAGNOSIS — R569 Unspecified convulsions: Secondary | ICD-10-CM

## 2018-03-25 DIAGNOSIS — Z9114 Patient's other noncompliance with medication regimen: Secondary | ICD-10-CM

## 2018-03-25 DIAGNOSIS — J45909 Unspecified asthma, uncomplicated: Secondary | ICD-10-CM | POA: Diagnosis present

## 2018-03-25 DIAGNOSIS — G9340 Encephalopathy, unspecified: Secondary | ICD-10-CM | POA: Diagnosis present

## 2018-03-25 DIAGNOSIS — F1010 Alcohol abuse, uncomplicated: Secondary | ICD-10-CM | POA: Diagnosis present

## 2018-03-25 DIAGNOSIS — R32 Unspecified urinary incontinence: Secondary | ICD-10-CM | POA: Diagnosis present

## 2018-03-25 DIAGNOSIS — F0390 Unspecified dementia without behavioral disturbance: Secondary | ICD-10-CM | POA: Diagnosis present

## 2018-03-25 DIAGNOSIS — Z9049 Acquired absence of other specified parts of digestive tract: Secondary | ICD-10-CM

## 2018-03-25 LAB — COMPREHENSIVE METABOLIC PANEL
ALT: 14 U/L (ref 0–44)
ANION GAP: 9 (ref 5–15)
AST: 26 U/L (ref 15–41)
Albumin: 4 g/dL (ref 3.5–5.0)
Alkaline Phosphatase: 98 U/L (ref 38–126)
BILIRUBIN TOTAL: 0.7 mg/dL (ref 0.3–1.2)
BUN: 17 mg/dL (ref 8–23)
CHLORIDE: 104 mmol/L (ref 98–111)
CO2: 28 mmol/L (ref 22–32)
Calcium: 9.4 mg/dL (ref 8.9–10.3)
Creatinine, Ser: 0.76 mg/dL (ref 0.44–1.00)
GFR calc Af Amer: >60 mL/min (ref >60)
Glucose, Bld: 103 mg/dL — ABNORMAL HIGH (ref 70–99)
POTASSIUM: 3.7 mmol/L (ref 3.5–5.1)
Sodium: 141 mmol/L (ref 135–145)
TOTAL PROTEIN: 7.8 g/dL (ref 6.5–8.1)

## 2018-03-25 LAB — CBC WITH DIFFERENTIAL/PLATELET
BASOS ABS: 0 10*3/uL (ref 0.0–0.1)
Basophils Relative: 0 %
Eosinophils Absolute: 0 10*3/uL (ref 0.0–0.7)
Eosinophils Relative: 0 %
HCT: 41.1 % (ref 36.0–46.0)
HEMOGLOBIN: 13.9 g/dL (ref 12.0–15.0)
LYMPHS ABS: 0.7 10*3/uL (ref 0.7–4.0)
LYMPHS PCT: 6 %
MCH: 30.2 pg (ref 26.0–34.0)
MCHC: 33.8 g/dL (ref 30.0–36.0)
MCV: 89.2 fL (ref 78.0–100.0)
Monocytes Absolute: 0.8 10*3/uL (ref 0.1–1.0)
Monocytes Relative: 7 %
NEUTROS PCT: 87 %
Neutro Abs: 10.1 10*3/uL — ABNORMAL HIGH (ref 1.7–7.7)
PLATELETS: 475 10*3/uL — AB (ref 150–400)
RBC: 4.61 MIL/uL (ref 3.87–5.11)
RDW: 14 % (ref 11.5–15.5)
WBC: 11.6 10*3/uL — AB (ref 4.0–10.5)

## 2018-03-25 LAB — I-STAT CHEM 8, ED
BUN: 16 mg/dL (ref 8–23)
CREATININE: 0.7 mg/dL (ref 0.44–1.00)
Calcium, Ion: 1.12 mmol/L — ABNORMAL LOW (ref 1.15–1.40)
Chloride: 103 mmol/L (ref 98–111)
Glucose, Bld: 102 mg/dL — ABNORMAL HIGH (ref 70–99)
HEMATOCRIT: 43 % (ref 36.0–46.0)
HEMOGLOBIN: 14.6 g/dL (ref 12.0–15.0)
Potassium: 3.7 mmol/L (ref 3.5–5.1)
Sodium: 142 mmol/L (ref 135–145)
TCO2: 24 mmol/L (ref 22–32)

## 2018-03-25 LAB — RAPID HIV SCREEN (HIV 1/2 AB+AG)
HIV 1/2 ANTIBODIES: NONREACTIVE
HIV-1 P24 ANTIGEN - HIV24: NONREACTIVE

## 2018-03-25 LAB — CBG MONITORING, ED: Glucose-Capillary: 104 mg/dL — ABNORMAL HIGH (ref 70–99)

## 2018-03-25 LAB — ETHANOL: Alcohol, Ethyl (B): <10 mg/dL (ref <10)

## 2018-03-25 MED ORDER — LORAZEPAM 2 MG/ML IJ SOLN
1.0000 mg | Freq: Once | INTRAMUSCULAR | Status: AC
  Administered 2018-03-25: 1 mg via INTRAVENOUS
  Filled 2018-03-25: qty 1

## 2018-03-25 MED ORDER — LEVETIRACETAM IN NACL 1000 MG/100ML IV SOLN
1000.0000 mg | Freq: Once | INTRAVENOUS | Status: AC
  Administered 2018-03-25: 1000 mg via INTRAVENOUS
  Filled 2018-03-25: qty 100

## 2018-03-25 MED ORDER — SODIUM CHLORIDE 0.9 % IV BOLUS: 1000.0000 mL | Freq: Once | INTRAVENOUS | Status: DC

## 2018-03-25 MED ORDER — ACETAMINOPHEN 500 MG PO TABS
1000.0000 mg | ORAL_TABLET | Freq: Once | ORAL | Status: AC
  Administered 2018-03-25: 1000 mg via ORAL
  Filled 2018-03-25: qty 2

## 2018-03-25 MED ORDER — SODIUM CHLORIDE 0.9 % IV SOLN
INTRAVENOUS | Status: DC
  Administered 2018-03-25: 16:00:00 via INTRAVENOUS

## 2018-03-25 NOTE — ED Notes (Signed)
Pt still refusing to let staff take bedpan out from under her.

## 2018-03-25 NOTE — ED Notes (Signed)
Pt refusing to let staff take bedpan out from under her.
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
## 2018-03-25 NOTE — ED Notes (Signed)
Pt refusing further VS

## 2018-03-25 NOTE — ED Notes (Signed)
Bed: [facility] Expected date:  Expected time:  Means of arrival:  Comments: 62 yo seizure w/ hx of the same
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
## 2018-03-25 NOTE — ED Notes (Signed)
Bed: [facility]10 Expected date:  Expected time:  Means of arrival:  Comments: [facility] 
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
## 2018-03-26 ENCOUNTER — Emergency Department (HOSPITAL_COMMUNITY): Payer: Medicaid Other

## 2018-03-26 ENCOUNTER — Inpatient Hospital Stay (HOSPITAL_COMMUNITY): Payer: Medicaid Other

## 2018-03-26 DIAGNOSIS — Z9114 Patient's other noncompliance with medication regimen: Secondary | ICD-10-CM | POA: Diagnosis not present

## 2018-03-26 DIAGNOSIS — F10931 Alcohol use, unspecified with withdrawal delirium: Secondary | ICD-10-CM | POA: Diagnosis present

## 2018-03-26 DIAGNOSIS — Z79899 Other long term (current) drug therapy: Secondary | ICD-10-CM | POA: Diagnosis not present

## 2018-03-26 DIAGNOSIS — R269 Unspecified abnormalities of gait and mobility: Secondary | ICD-10-CM | POA: Diagnosis present

## 2018-03-26 DIAGNOSIS — N390 Urinary tract infection, site not specified: Secondary | ICD-10-CM | POA: Diagnosis present

## 2018-03-26 DIAGNOSIS — F1910 Other psychoactive substance abuse, uncomplicated: Secondary | ICD-10-CM | POA: Diagnosis not present

## 2018-03-26 DIAGNOSIS — Z9119 Patient's noncompliance with other medical treatment and regimen: Secondary | ICD-10-CM | POA: Diagnosis not present

## 2018-03-26 DIAGNOSIS — F10239 Alcohol dependence with withdrawal, unspecified: Secondary | ICD-10-CM | POA: Diagnosis not present

## 2018-03-26 DIAGNOSIS — K5900 Constipation, unspecified: Secondary | ICD-10-CM | POA: Diagnosis present

## 2018-03-26 DIAGNOSIS — R710 Precipitous drop in hematocrit: Secondary | ICD-10-CM | POA: Diagnosis not present

## 2018-03-26 DIAGNOSIS — G9340 Encephalopathy, unspecified: Secondary | ICD-10-CM | POA: Diagnosis not present

## 2018-03-26 DIAGNOSIS — F1010 Alcohol abuse, uncomplicated: Secondary | ICD-10-CM | POA: Diagnosis not present

## 2018-03-26 DIAGNOSIS — F10231 Alcohol dependence with withdrawal delirium: Secondary | ICD-10-CM | POA: Diagnosis not present

## 2018-03-26 DIAGNOSIS — E43 Unspecified severe protein-calorie malnutrition: Secondary | ICD-10-CM | POA: Diagnosis present

## 2018-03-26 DIAGNOSIS — Z720 Tobacco use: Secondary | ICD-10-CM | POA: Diagnosis not present

## 2018-03-26 DIAGNOSIS — G40909 Epilepsy, unspecified, not intractable, without status epilepticus: Secondary | ICD-10-CM | POA: Diagnosis present

## 2018-03-26 DIAGNOSIS — K922 Gastrointestinal hemorrhage, unspecified: Secondary | ICD-10-CM | POA: Diagnosis not present

## 2018-03-26 DIAGNOSIS — F0390 Unspecified dementia without behavioral disturbance: Secondary | ICD-10-CM | POA: Diagnosis present

## 2018-03-26 DIAGNOSIS — Z90710 Acquired absence of both cervix and uterus: Secondary | ICD-10-CM | POA: Diagnosis not present

## 2018-03-26 DIAGNOSIS — I10 Essential (primary) hypertension: Secondary | ICD-10-CM | POA: Diagnosis present

## 2018-03-26 DIAGNOSIS — J45909 Unspecified asthma, uncomplicated: Secondary | ICD-10-CM | POA: Diagnosis present

## 2018-03-26 DIAGNOSIS — R569 Unspecified convulsions: Secondary | ICD-10-CM | POA: Diagnosis not present

## 2018-03-26 DIAGNOSIS — Z811 Family history of alcohol abuse and dependence: Secondary | ICD-10-CM | POA: Diagnosis not present

## 2018-03-26 DIAGNOSIS — R32 Unspecified urinary incontinence: Secondary | ICD-10-CM | POA: Diagnosis present

## 2018-03-26 DIAGNOSIS — Z6825 Body mass index (BMI) 25.0-25.9, adult: Secondary | ICD-10-CM | POA: Diagnosis not present

## 2018-03-26 DIAGNOSIS — F17210 Nicotine dependence, cigarettes, uncomplicated: Secondary | ICD-10-CM | POA: Diagnosis present

## 2018-03-26 DIAGNOSIS — D649 Anemia, unspecified: Secondary | ICD-10-CM | POA: Diagnosis not present

## 2018-03-26 DIAGNOSIS — Z9049 Acquired absence of other specified parts of digestive tract: Secondary | ICD-10-CM | POA: Diagnosis not present

## 2018-03-26 LAB — CBC
HEMATOCRIT: 42.4 % (ref 36.0–46.0)
Hemoglobin: 14.2 g/dL (ref 12.0–15.0)
MCH: 29.8 pg (ref 26.0–34.0)
MCHC: 33.5 g/dL (ref 30.0–36.0)
MCV: 89.1 fL (ref 78.0–100.0)
Platelets: 411 10*3/uL — ABNORMAL HIGH (ref 150–400)
RBC: 4.76 MIL/uL (ref 3.87–5.11)
RDW: 14 % (ref 11.5–15.5)
WBC: 10.1 10*3/uL (ref 4.0–10.5)

## 2018-03-26 LAB — URINALYSIS, ROUTINE W REFLEX MICROSCOPIC
Bilirubin Urine: NEGATIVE
Glucose, UA: NEGATIVE mg/dL
Ketones, ur: 20 mg/dL — AB
Nitrite: NEGATIVE
Protein, ur: 30 mg/dL — AB
Specific Gravity, Urine: 1.02 (ref 1.005–1.030)
pH: 6 (ref 5.0–8.0)

## 2018-03-26 LAB — COMPREHENSIVE METABOLIC PANEL
ALT: 15 U/L (ref 0–44)
AST: 22 U/L (ref 15–41)
Albumin: 4 g/dL (ref 3.5–5.0)
Alkaline Phosphatase: 91 U/L (ref 38–126)
Anion gap: 10 (ref 5–15)
BILIRUBIN TOTAL: 1 mg/dL (ref 0.3–1.2)
BUN: 18 mg/dL (ref 8–23)
CO2: 24 mmol/L (ref 22–32)
CREATININE: 0.79 mg/dL (ref 0.44–1.00)
Calcium: 9.2 mg/dL (ref 8.9–10.3)
Chloride: 108 mmol/L (ref 98–111)
Glucose, Bld: 90 mg/dL (ref 70–99)
Potassium: 3.9 mmol/L (ref 3.5–5.1)
Sodium: 142 mmol/L (ref 135–145)
TOTAL PROTEIN: 7.9 g/dL (ref 6.5–8.1)

## 2018-03-26 LAB — RAPID URINE DRUG SCREEN, HOSP PERFORMED
AMPHETAMINES: NOT DETECTED
Benzodiazepines: NOT DETECTED
COCAINE: NOT DETECTED
OPIATES: NOT DETECTED
TETRAHYDROCANNABINOL: NOT DETECTED

## 2018-03-26 LAB — MRSA PCR SCREENING: MRSA by PCR: NEGATIVE

## 2018-03-26 LAB — PHOSPHORUS: PHOSPHORUS: 2.9 mg/dL (ref 2.5–4.6)

## 2018-03-26 LAB — TSH: TSH: 0.509 u[IU]/mL (ref 0.350–4.500)

## 2018-03-26 LAB — VALPROIC ACID LEVEL: Valproic Acid Lvl: <10 ug/mL — ABNORMAL LOW (ref 50.0–100.0)

## 2018-03-26 LAB — MAGNESIUM: Magnesium: 2.1 mg/dL (ref 1.7–2.4)

## 2018-03-26 MED ORDER — PANTOPRAZOLE SODIUM 40 MG PO TBEC
40.0000 mg | DELAYED_RELEASE_TABLET | Freq: Two times a day (BID) | ORAL | Status: DC
  Administered 2018-03-26 – 2018-03-28 (×6): 40 mg via ORAL
  Filled 2018-03-26 (×7): qty 1

## 2018-03-26 MED ORDER — SODIUM CHLORIDE 0.9 % IV SOLN
1.0000 g | INTRAVENOUS | Status: DC
  Administered 2018-03-26 – 2018-03-27 (×2): 1 g via INTRAVENOUS
  Filled 2018-03-26: qty 1
  Filled 2018-03-26 (×2): qty 10

## 2018-03-26 MED ORDER — ALBUTEROL SULFATE (2.5 MG/3ML) 0.083% IN NEBU
2.5000 mg | INHALATION_SOLUTION | Freq: Four times a day (QID) | RESPIRATORY_TRACT | Status: DC | PRN
Start: 2018-03-26 — End: 2018-03-30

## 2018-03-26 MED ORDER — DIVALPROEX SODIUM 250 MG PO DR TAB
500.0000 mg | DELAYED_RELEASE_TABLET | Freq: Two times a day (BID) | ORAL | Status: DC
  Administered 2018-03-26 – 2018-03-30 (×9): 500 mg via ORAL
  Filled 2018-03-26 (×9): qty 2

## 2018-03-26 MED ORDER — METOPROLOL TARTRATE 5 MG/5ML IV SOLN
5.0000 mg | INTRAVENOUS | Status: DC | PRN
  Administered 2018-03-26: 5 mg via INTRAVENOUS
  Filled 2018-03-26: qty 5

## 2018-03-26 MED ORDER — SODIUM CHLORIDE 0.9 % IV SOLN
INTRAVENOUS | Status: DC
  Administered 2018-03-26: 100 mL/h via INTRAVENOUS
  Administered 2018-03-26: 04:00:00 via INTRAVENOUS

## 2018-03-26 MED ORDER — AMLODIPINE BESYLATE 10 MG PO TABS
10.0000 mg | ORAL_TABLET | Freq: Every day | ORAL | Status: DC
  Administered 2018-03-26 – 2018-03-30 (×5): 10 mg via ORAL
  Filled 2018-03-26 (×5): qty 1

## 2018-03-26 MED ORDER — LABETALOL HCL 5 MG/ML IV SOLN
5.0000 mg | INTRAVENOUS | Status: DC
  Administered 2018-03-26 (×2): 5 mg via INTRAVENOUS
  Filled 2018-03-26 (×2): qty 4

## 2018-03-26 MED ORDER — MILK AND MOLASSES ENEMA
1.0000 | RECTAL | Status: DC | PRN
  Filled 2018-03-26: qty 250

## 2018-03-26 MED ORDER — BISACODYL 10 MG RE SUPP
10.0000 mg | Freq: Every day | RECTAL | Status: DC | PRN
  Filled 2018-03-26: qty 1

## 2018-03-26 MED ORDER — ACETAMINOPHEN 325 MG PO TABS
650.0000 mg | ORAL_TABLET | Freq: Four times a day (QID) | ORAL | Status: DC | PRN
  Administered 2018-03-29: 650 mg via ORAL
  Filled 2018-03-26: qty 2

## 2018-03-26 MED ORDER — IOPAMIDOL (ISOVUE-300) INJECTION 61%
INTRAVENOUS | Status: AC
  Filled 2018-03-26: qty 100

## 2018-03-26 MED ORDER — LEVETIRACETAM 500 MG PO TABS
750.0000 mg | ORAL_TABLET | Freq: Two times a day (BID) | ORAL | Status: DC
  Administered 2018-03-26 – 2018-03-27 (×4): 750 mg via ORAL
  Administered 2018-03-28: 500 mg via ORAL
  Administered 2018-03-28 – 2018-03-30 (×4): 750 mg via ORAL
  Filled 2018-03-26 (×10): qty 1

## 2018-03-26 MED ORDER — THIAMINE HCL 100 MG/ML IJ SOLN
100.0000 mg | INTRAMUSCULAR | Status: DC
  Administered 2018-03-26: 100 mg via INTRAVENOUS
  Filled 2018-03-26: qty 2

## 2018-03-26 MED ORDER — ONDANSETRON HCL 4 MG/2ML IJ SOLN: 4.0000 mg | Freq: Four times a day (QID) | INTRAMUSCULAR | Status: DC | PRN

## 2018-03-26 MED ORDER — ACETAMINOPHEN 650 MG RE SUPP: 650.0000 mg | Freq: Four times a day (QID) | RECTAL | Status: DC | PRN

## 2018-03-26 MED ORDER — IOPAMIDOL (ISOVUE-300) INJECTION 61%
100.0000 mL | Freq: Once | INTRAVENOUS | Status: AC | PRN
  Administered 2018-03-26: 100 mL via INTRAVENOUS

## 2018-03-26 MED ORDER — POLYETHYLENE GLYCOL 3350 17 G PO PACK: 17.0000 g | PACK | Freq: Every day | ORAL | Status: DC | PRN

## 2018-03-26 MED ORDER — ONDANSETRON HCL 4 MG PO TABS
4.0000 mg | ORAL_TABLET | Freq: Four times a day (QID) | ORAL | Status: DC | PRN
  Administered 2018-03-26: 4 mg via ORAL
  Filled 2018-03-26: qty 1

## 2018-03-26 MED ORDER — LABETALOL HCL 5 MG/ML IV SOLN
5.0000 mg | INTRAVENOUS | Status: DC | PRN
  Filled 2018-03-26: qty 4

## 2018-03-26 NOTE — Evaluation (Signed)
Physical Therapy Evaluation Patient Details Name: Lindsey [name] MRN: [id number] DOB: May 27, 1956 Today's Date: 03/26/2018   History of Present Illness  Pt admitted s/p seizures and with hx of epilepsy and ETOH abuse.    Clinical Impression  Pt admitted as above and presenting with functional mobility limitations 2* generalized weakness, significant balance deficits, limited endurance and poor safety awareness.  Pt hopes to progress to return home with daughter.    Follow Up Recommendations Home health PT    Equipment Recommendations  None recommended by PT    Recommendations for Other Services       Precautions / Restrictions Precautions Precautions: Fall Restrictions Weight Bearing Restrictions: No      Mobility  Bed Mobility Overal bed mobility: Needs Assistance Bed Mobility: Supine to Sit     Supine to sit: Min assist;HOB elevated     General bed mobility comments: Increased time with cues for sequence  Transfers Overall transfer level: Needs assistance Equipment used: Rolling walker (2 wheeled) Transfers: Sit to/from Stand Sit to Stand: Min assist         General transfer comment: cues for safety and use of UEs to self assist  Ambulation/Gait Ambulation/Gait assistance: Mod assist Gait Distance (Feet): 15 Feet(twice - to/from bathroom) Assistive device: Rolling walker (2 wheeled) Gait Pattern/deviations: Step-to pattern;Step-through pattern;Decreased step length - right;Decreased step length - left;Shuffle;Scissoring;Trunk flexed;Staggering left;Staggering right;Narrow base of support Gait velocity: decr   General Gait Details: Very impulsive with constant cues for safety and appropriate use of RW  Stairs            Wheelchair Mobility    Modified Rankin (Stroke Patients Only)       Balance Overall balance assessment: Needs assistance Sitting-balance support: Feet supported;Bilateral upper extremity supported Sitting balance-Leahy Scale:  Fair     Standing balance support: Bilateral upper extremity supported Standing balance-Leahy Scale: Poor                               Pertinent Vitals/Pain Pain Assessment: Faces Faces Pain Scale: Hurts little more Pain Location: buttocks Pain Descriptors / Indicators: Grimacing Pain Intervention(s): Limited activity within patient's tolerance;Monitored during session    Home Living Family/patient expects to be discharged to:: Private residence Living Arrangements: Children Available Help at Discharge: Family;Available 24 hours/day Type of Home: Apartment Home Access: Elevator     Home Layout: One level Home Equipment: Walker - 2 wheels;Cane - single point Additional Comments: Pt states she lives with her daughter    Prior Function Level of Independence: Independent with assistive device(s)         Comments: Pt is poor historian -  some information taken from chart for previous hospital stay in 2018     Hand Dominance   Dominant Hand: Right    Extremity/Trunk Assessment   Upper Extremity Assessment Upper Extremity Assessment: Generalized weakness    Lower Extremity Assessment Lower Extremity Assessment: Generalized weakness;RLE deficits/detail;LLE deficits/detail RLE Coordination: decreased gross motor LLE Coordination: decreased gross motor    Cervical / Trunk Assessment Cervical / Trunk Assessment: Kyphotic  Communication   Communication: No difficulties  Cognition Arousal/Alertness: Awake/alert Behavior During Therapy: Impulsive Overall Cognitive Status: No family/caregiver present to determine baseline cognitive functioning  General Comments      Exercises     Assessment/Plan    PT Assessment Patient needs continued PT services  PT Problem List Decreased strength;Decreased activity tolerance;Decreased balance;Decreased mobility;Decreased cognition;Decreased knowledge of use  of DME;Decreased safety awareness       PT Treatment Interventions DME instruction;Gait training;Functional mobility training;Therapeutic activities;Therapeutic exercise;Patient/family education    PT Goals (Current goals can be found in the Care Plan section)  Acute Rehab PT Goals Patient Stated Goal: Go home PT Goal Formulation: With patient Time For Goal Achievement: 04/09/18 Potential to Achieve Goals: Good    Frequency Min 3X/week   Barriers to discharge        Co-evaluation               AM-PAC PT "6 Clicks" Daily Activity  Outcome Measure Difficulty turning over in bed (including adjusting bedclothes, sheets and blankets)?: A Lot Difficulty moving from lying on back to sitting on the side of the bed? : A Lot Difficulty sitting down on and standing up from a chair with arms (e.g., wheelchair, bedside commode, etc,.)?: A Lot Help needed moving to and from a bed to chair (including a wheelchair)?: A Lot Help needed walking in hospital room?: A Lot Help needed climbing 3-5 steps with a railing? : A Lot 6 Click Score: 12    End of Session Equipment Utilized During Treatment: Gait belt Activity Tolerance: Patient limited by fatigue Patient left: in chair;with call bell/phone within reach;with chair alarm set Nurse Communication: Mobility status PT Visit Diagnosis: Muscle weakness (generalized) (M62.81);Difficulty in walking, not elsewhere classified (R26.2);Ataxic gait (R26.0);History of falling (Z91.81);Unsteadiness on feet (R26.81)    Time: 7673-4193 PT Time Calculation (min) (ACUTE ONLY): 38 min   Charges:   PT Evaluation $PT Eval Moderate Complexity: 1 Mod PT Treatments $Gait Training: 8-22 mins $Therapeutic Activity: 8-22 mins   PT G Codes:        Pg [phone]   Darilyn Storbeck 03/26/2018, 3:56 PM

## 2018-03-26 NOTE — ED Notes (Signed)
ED TO INPATIENT HANDOFF REPORT  Name/Age/Gender Lindsey [name] 62 y.o. female  Code Status Code Status History    Date Active Date Inactive Code Status Order ID Comments User Context   05/01/2017 1826 05/03/2017 2230 Full Code [id number]  Vianne Bulls, MD ED   06/25/2016 0259 06/26/2016 2244 Full Code [id number]  Hugelmeyer, Tennant, DO Inpatient   11/02/2015 1932 11/06/2015 1812 Full Code [id number]  Greta Doom, MD Inpatient   10/10/2015 2059 10/13/2015 1918 Full Code [id number]  Reubin Milan, MD Inpatient   08/15/2015 1525 08/19/2015 1930 Full Code [id number]  [name], MD ED   03/01/2015 2005 03/08/2015 1811 Full Code [id number]  Etta Quill, DO ED   02/06/2015 0603 02/07/2015 1847 Full Code [id number]  Lavina Hamman, MD ED   01/18/2015 1143 01/19/2015 1733 Full Code [id number]  Orson Eva, MD Inpatient   08/16/2014 1700 08/18/2014 1815 Full Code [id number]  Varney Biles, MD ED   03/04/2014 1940 03/06/2014 1528 Full Code [id number]  Barton Dubois, MD Inpatient   11/06/2013 0146 11/09/2013 1355 Full Code [id number]  Berle Mull, MD Inpatient      Home/SNF/Other Home  Chief Complaint seizures  Level of Care/Admitting Diagnosis ED Disposition    ED Disposition Condition [place]: [facility] [[id number]]  Level of Care: Stepdown [14]  Admit to SDU based on following criteria: Severe physiological/psychological symptoms:  Any diagnosis requiring assessment & intervention at least every 4 hours on an ongoing basis to obtain desired patient outcomes including stability and rehabilitation  Diagnosis: Alcohol withdrawal delirium ([place]) [291.0.ICD-9-CM]  Admitting Physician: Toy Baker [[id number]]  Attending Physician: Toy Baker [[id number]]  Estimated length of stay: 3 - 4 days  Certification:: I certify this patient will need inpatient services for at least 2 midnights  PT Class (Do Not Modify): Inpatient [101]  PT Acc Code  (Do Not Modify): Private [1]       Medical History Past Medical History:  Diagnosis Date  . Asthma   . H/O ETOH abuse   . Seizure ([place])     Allergies Allergies  Allergen Reactions  . Kiwi Extract Hives and Itching  . Morphine And Related Hives  . Penicillins Hives and Itching    Has patient had a PCN reaction causing immediate rash, facial/tongue/throat swelling, SOB or lightheadedness with hypotension: Yes Has patient had a PCN reaction causing severe rash involving mucus membranes or skin necrosis: No Has patient had a PCN reaction that required hospitalization No Has patient had a PCN reaction occurring within the last 10 years: No If all of the above answers are "NO", then may proceed with Cephalosporin use.  [place] Extract Hives and Itching    IV Location/Drains/Wounds Patient Lines/Drains/Airways Status   Active Line/Drains/Airways    Name:   Placement date:   Placement time:   Site:   Days:   Peripheral IV 03/25/18 Right Hand   03/25/18    2140    Hand   1          Labs/Imaging Results for orders placed or performed during the hospital encounter of 03/25/18 (from the past 48 hour(s))  CBG monitoring, ED     Status: Abnormal   Collection Time: 03/25/18  3:26 PM  Result Value Ref Range   Glucose-Capillary 104 (H) 70 - 99 mg/dL  Comprehensive metabolic panel     Status: Abnormal   Collection Time: 03/25/18  4:12 PM  Result Value Ref Range  Sodium 141 135 - 145 mmol/L   Potassium 3.7 3.5 - 5.1 mmol/L   Chloride 104 98 - 111 mmol/L    Comment: Please note change in reference range.   CO2 28 22 - 32 mmol/L   Glucose, Bld 103 (H) 70 - 99 mg/dL    Comment: Please note change in reference range.   BUN 17 8 - 23 mg/dL    Comment: Please note change in reference range.   Creatinine, Ser 0.76 0.44 - 1.00 mg/dL   Calcium 9.4 8.9 - 10.3 mg/dL   Total Protein 7.8 6.5 - 8.1 g/dL   Albumin 4.0 3.5 - 5.0 g/dL   AST 26 15 - 41 U/L   ALT 14 0 - 44 U/L     Comment: Please note change in reference range.   Alkaline Phosphatase 98 38 - 126 U/L   Total Bilirubin 0.7 0.3 - 1.2 mg/dL   GFR calc non Af Amer >60 >60 mL/min   GFR calc Af Amer >60 >60 mL/min    Comment: (NOTE) The eGFR has been calculated using the CKD EPI equation. This calculation has not been validated in all clinical situations. eGFR's persistently <60 mL/min signify possible Chronic Kidney Disease.    Anion gap 9 5 - 15    Comment: Performed at [facility], [place] [address] [place], [place] [address]  CBC with Differential     Status: Abnormal   Collection Time: 03/25/18  4:12 PM  Result Value Ref Range   WBC 11.6 (H) 4.0 - 10.5 K/uL   RBC 4.61 3.87 - 5.11 MIL/uL   Hemoglobin 13.9 12.0 - 15.0 g/dL   HCT 41.1 36.0 - 46.0 %   MCV 89.2 78.0 - 100.0 fL   MCH 30.2 26.0 - 34.0 pg   MCHC 33.8 30.0 - 36.0 g/dL   RDW 14.0 11.5 - 15.5 %   Platelets 475 (H) 150 - 400 K/uL   Neutrophils Relative % 87 %   Neutro Abs 10.1 (H) 1.7 - 7.7 K/uL   Lymphocytes Relative 6 %   Lymphs Abs 0.7 0.7 - 4.0 K/uL   Monocytes Relative 7 %   Monocytes Absolute 0.8 0.1 - 1.0 K/uL   Eosinophils Relative 0 %   Eosinophils Absolute 0.0 0.0 - 0.7 K/uL   Basophils Relative 0 %   Basophils Absolute 0.0 0.0 - 0.1 K/uL    Comment: Performed at [facility], [place] [address] [place], [place] [address]  Ethanol     Status: None   Collection Time: 03/25/18  4:12 PM  Result Value Ref Range   Alcohol, Ethyl (B) <10 <10 mg/dL    Comment: (NOTE) Lowest detectable limit for serum alcohol is 10 mg/dL. For medical purposes only. Performed at [facility], [place] [address] [place], [place] [address]   I-stat Chem 8, ED     Status: Abnormal   Collection Time: 03/25/18  4:16 PM  Result Value Ref Range   Sodium 142 135 - 145 mmol/L   Potassium 3.7 3.5 - 5.1 mmol/L   Chloride 103 98 - 111 mmol/L   BUN 16 8 - 23 mg/dL   Creatinine, Ser 0.70 0.44 - 1.00  mg/dL   Glucose, Bld 102 (H) 70 - 99 mg/dL   Calcium, Ion 1.12 (L) 1.15 - 1.40 mmol/L   TCO2 24 22 - 32 mmol/L   Hemoglobin 14.6 12.0 - 15.0 g/dL   HCT 43.0 36.0 - 46.0 %  Rapid  HIV screen (HIV 1/2 Ab+Ag)     Status: None   Collection Time: 03/25/18  5:52 PM  Result Value Ref Range   HIV-1 P24 Antigen - HIV24 NON REACTIVE NON REACTIVE   HIV 1/2 Antibodies NON REACTIVE NON REACTIVE   Interpretation (HIV Ag Ab)      A non reactive test result means that HIV 1 or HIV 2 antibodies and HIV 1 p24 antigen were not detected in the specimen.    Comment: RESULT CALLED TO, READ BACK BY AND VERIFIED WITH: CLAPP,S RN [phone] [place],N Performed at [facility], [place] [address], [place], [place] [address]   Urinalysis, Routine w reflex microscopic     Status: Abnormal   Collection Time: 03/25/18 11:38 PM  Result Value Ref Range   Color, Urine YELLOW YELLOW   APPearance HAZY (A) CLEAR   Specific Gravity, Urine 1.020 1.005 - 1.030   pH 6.0 5.0 - 8.0   Glucose, UA NEGATIVE NEGATIVE mg/dL   Hgb urine dipstick MODERATE (A) NEGATIVE   Bilirubin Urine NEGATIVE NEGATIVE   Ketones, ur 20 (A) NEGATIVE mg/dL   Protein, ur 30 (A) NEGATIVE mg/dL   Nitrite NEGATIVE NEGATIVE   Leukocytes, UA LARGE (A) NEGATIVE   RBC / HPF 11-20 0 - 5 RBC/hpf   WBC, UA 21-50 0 - 5 WBC/hpf   Bacteria, UA FEW (A) NONE SEEN   Squamous Epithelial / LPF 0-5 0 - 5   Mucus PRESENT    Hyaline Casts, UA PRESENT     Comment: Performed at [facility], [place] [address], [place], [place] [address]  Urine rapid drug screen (hosp performed)     Status: Abnormal   Collection Time: 03/26/18 12:24 AM  Result Value Ref Range   Opiates NONE DETECTED NONE DETECTED   Cocaine NONE DETECTED NONE DETECTED   Benzodiazepines NONE DETECTED NONE DETECTED   Amphetamines NONE DETECTED NONE DETECTED   Tetrahydrocannabinol NONE DETECTED NONE DETECTED   Barbiturates (A) NONE DETECTED    Result not available.  Reagent lot number recalled by manufacturer.    Comment: (NOTE) DRUG SCREEN FOR MEDICAL PURPOSES ONLY.  IF CONFIRMATION IS NEEDED FOR ANY PURPOSE, NOTIFY LAB WITHIN 5 DAYS. LOWEST DETECTABLE LIMITS FOR URINE DRUG SCREEN Drug Class                     Cutoff (ng/mL) Amphetamine and metabolites    1000 Barbiturate and metabolites    200 Benzodiazepine                 176 Tricyclics and metabolites     300 Opiates and metabolites        300 Cocaine and metabolites        300 THC                            50 Performed at [facility], [place] [address] [place], [place] [address]    Ct Head Wo Contrast  Result Date: 03/26/2018 CLINICAL DATA:  62 year old female with altered mental status. EXAM: CT HEAD WITHOUT CONTRAST TECHNIQUE: Contiguous axial images were obtained from the base of the skull through the vertex without intravenous contrast. COMPARISON:  Head CT dated 05/01/2017 FINDINGS: Brain: Minimal age-related atrophy and chronic microvascular ischemic changes. There is no acute intracranial hemorrhage. No mass effect or midline shift. No extra-axial fluid collection. Vascular: No hyperdense vessel or unexpected calcification. Skull: Normal.  Negative for fracture or focal lesion. Sinuses/Orbits: No acute finding. Other: None IMPRESSION: No acute intracranial pathology. Mild age-related atrophy and chronic microvascular ischemic changes. Electronically Signed   By: Anner Crete M.D.   On: 03/26/2018 01:04    Pending Labs Unresulted Labs (From admission, onward)   Start     Ordered   03/25/18 1752  Hepatitis panel, acute  Once,   R     03/25/18 1752   03/25/18 1436  Valproic acid level  STAT,   STAT     03/25/18 1442   Signed and Held  Magnesium  Tomorrow morning,   R    Comments:  Call MD if <1.5    Signed and Held   Signed and Held  Phosphorus  Tomorrow morning,   R     Signed and Held   Signed and Held  TSH  Once,   R    Comments:  Cancel if already done  within 1 month and notify MD    Signed and Held   Signed and Held  Comprehensive metabolic panel  Once,   R    Comments:  Cal MD for K<3.5 or >5.0    Signed and Held   Signed and Held  CBC  Once,   R    Comments:  Call for hg <8.0    Signed and Held   Signed and Held  Urine Culture  Once,   R     Signed and Held      Vitals/Pain Today's Vitals   03/25/18 2345 03/26/18 0000 03/26/18 0015 03/26/18 0023  BP:    (!) 148/84  Pulse: (!) 109 (!) 106 (!) 105 (!) 102  Resp:    18  Temp:      TempSrc:      SpO2: 99% 98% 98% 98%    Isolation Precautions No active isolations  Medications Medications  0.9 %  sodium chloride infusion ( Intravenous Stopped 03/25/18 1843)  sodium chloride 0.9 % bolus 1,000 mL (1,000 mLs Intravenous Refused 03/25/18 1843)  iopamidol (ISOVUE-300) 61 % injection (has no administration in time range)  levETIRAcetam (KEPPRA) IVPB 1000 mg/100 mL premix (0 mg Intravenous Stopped 03/25/18 1844)  LORazepam (ATIVAN) injection 1 mg (1 mg Intravenous Given 03/25/18 2153)  acetaminophen (TYLENOL) tablet 1,000 mg (1,000 mg Oral Given 03/25/18 2324)  iopamidol (ISOVUE-300) 61 % injection 100 mL (100 mLs Intravenous Contrast Given 03/26/18 0112)    Mobility walks
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
## 2018-03-26 NOTE — Progress Notes (Signed)
Ms. [name] is a 62 year old female with past medical history significant for alcohol abuse, seizures, noncompliance with her medications who presented from home where she lives alone to the ED at [facility] with a seizure episode.  Admitted for acute encephalopathy in the setting of postictal state.  03/26/2018: Patient seen and examined at bedside.  Spoke with her daughter on the phone who reports that her last alcohol intake was yesterday 03/25/2018.  Patient is on CIWA protocol due to concern for alcohol withdrawal.  She has no new complaints.  Please refer to H&P dictated by Dr. Roel Cluck on 03/26/2018 for further details of the assessment and plan.

## 2018-03-27 DIAGNOSIS — F1010 Alcohol abuse, uncomplicated: Secondary | ICD-10-CM

## 2018-03-27 LAB — URINE CULTURE

## 2018-03-27 MED ORDER — VITAMIN B-1 100 MG PO TABS
100.0000 mg | ORAL_TABLET | Freq: Every day | ORAL | Status: DC
  Administered 2018-03-27 – 2018-03-30 (×4): 100 mg via ORAL
  Filled 2018-03-27 (×4): qty 1

## 2018-03-27 MED ORDER — ENSURE ENLIVE PO LIQD
237.0000 mL | Freq: Two times a day (BID) | ORAL | Status: DC
  Administered 2018-03-27 – 2018-03-30 (×5): 237 mL via ORAL

## 2018-03-27 MED ORDER — ENOXAPARIN SODIUM 40 MG/0.4ML ~~LOC~~ SOLN
40.0000 mg | SUBCUTANEOUS | Status: DC
  Administered 2018-03-27 – 2018-03-28 (×2): 40 mg via SUBCUTANEOUS
  Filled 2018-03-27 (×2): qty 0.4
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
## 2018-03-27 NOTE — Progress Notes (Signed)
Initial Nutrition Assessment  INTERVENTION:   Provide Ensure Enlive po BID, each supplement provides 350 kcal and 20 grams of protein  NUTRITION DIAGNOSIS:   Increased nutrient needs related to chronic illness(ETOH abuse) as evidenced by estimated needs.  GOAL:   Patient will meet greater than or equal to 90% of their needs  MONITOR:   PO intake, Supplement acceptance, Labs, Weight trends, I & O's  REASON FOR ASSESSMENT:   Consult Assessment of nutrition requirement/status  ASSESSMENT:   62 year old female with past medical history significant for alcohol abuse, seizures, noncompliance with her medications who presented from home where she lives alone to the ED at [facility] with a seizure episode.  Admitted for acute encephalopathy in the setting of postictal state.  Pt in room with RN providing medications. Pt reports good appetite, ate 100% of her breakfast of cereal with milk and jello. Pt really enjoys Ensure supplements , will order for patient. Suspect poor quality diet PTA given ETOH abuse (last drink 6/29).  Patient unable to provide UBW. Per weight records, pt with weight gain.   Labs reviewed. Medications: Thiamine tablet daily, Zofran PRN  NUTRITION - FOCUSED PHYSICAL EXAM:    Most Recent Value  Orbital Region  No depletion  Upper Arm Region  No depletion  Thoracic and Lumbar Region  Unable to assess  Buccal Region  Moderate depletion  Temple Region  Moderate depletion  Clavicle Bone Region  Mild depletion  Clavicle and Acromion Bone Region  No depletion  Scapular Bone Region  No depletion  Dorsal Hand  No depletion  Patellar Region  Unable to assess  Anterior Thigh Region  Unable to assess  Posterior Calf Region  Unable to assess  Edema (RD Assessment)  None       Diet Order:   Diet Order           Diet Heart Room service appropriate? Yes; Fluid consistency: Thin  Diet effective now          EDUCATION NEEDS:   Education needs have been  addressed  Skin:  Skin Assessment: Reviewed RN Assessment  Last BM:  7/1  Height:   Ht Readings from Last 1 Encounters:  03/26/18 5\' 5"  (1.651 m)    Weight:   Wt Readings from Last 1 Encounters:  03/26/18 150 lb 5.7 oz (68.2 kg)    Ideal Body Weight:  56.8 kg  BMI:  Body mass index is 25.02 kg/m.  Estimated Nutritional Needs:   Kcal:  1900-2100  Protein:  90-100g  Fluid:  1.9L/day   Clayton Bibles, MS, RD, LDN [facility] Pager: [phone] After Hours Pager: [phone]

## 2018-03-27 NOTE — Progress Notes (Signed)
Pt. Transferred to 1504 from 1237 by security, RN, and NT.

## 2018-03-27 NOTE — Evaluation (Signed)
Occupational Therapy Evaluation Patient Details Name: Lindsey [name] MRN: [id number] DOB: 1956-04-11 Today's Date: 03/27/2018    History of Present Illness Pt admitted s/p seizures and with hx of epilepsy and ETOH abuse.     Clinical Impression   Pt admitted with the above. Pt currently with functional limitations due to the deficits listed below (see OT Problem List).  Pt will benefit from skilled OT to increase their safety and independence with ADL and functional mobility for ADL to facilitate discharge to venue listed below.      Follow Up Recommendations  No OT follow up;Supervision/Assistance - 24 hour    Equipment Recommendations  3 in 1 bedside commode       Precautions / Restrictions Precautions Precautions: Fall Restrictions Weight Bearing Restrictions: No      Mobility Bed Mobility Overal bed mobility: Needs Assistance Bed Mobility: Supine to Sit;Sit to Supine     Supine to sit: Min assist;HOB elevated Sit to supine: Min assist      Transfers Overall transfer level: Needs assistance Equipment used: Rolling walker (2 wheeled) Transfers: Sit to/from [facility] Sit to Stand: Min assist Stand pivot transfers: Min assist       General transfer comment: cues for safety and use of UEs to self assist    Balance Overall balance assessment: Needs assistance Sitting-balance support: Feet supported;Bilateral upper extremity supported Sitting balance-Leahy Scale: Fair     Standing balance support: Bilateral upper extremity supported Standing balance-Leahy Scale: Poor                             ADL either performed or assessed with clinical judgement   ADL Overall ADL's : Needs assistance/impaired Eating/Feeding: Set up;Sitting   Grooming: Set up;Sitting   Upper Body Bathing: Set up;Sitting   Lower Body Bathing: Minimal assistance;Sit to/from stand   Upper Body Dressing : Set up;Sitting   Lower Body Dressing: Minimal  assistance;Sit to/from stand;Cueing for sequencing;Cueing for safety   Toilet Transfer: Minimal assistance;RW;Comfort height toilet   Toileting- Clothing Manipulation and Hygiene: Minimal assistance;Sit to/from stand;Cueing for safety       Functional mobility during ADLs: Minimal assistance General ADL Comments: pt states daughter A                  Pertinent Vitals/Pain Pain Assessment: No/denies pain Pain Intervention(s): Limited activity within patient's tolerance     Hand Dominance Right   Extremity/Trunk Assessment Upper Extremity Assessment Upper Extremity Assessment: Overall WFL for tasks assessed           Communication Communication Communication: No difficulties   Cognition Arousal/Alertness: Awake/alert Behavior During Therapy: WFL for tasks assessed/performed Overall Cognitive Status: No family/caregiver present to determine baseline cognitive functioning                                                Home Living Family/patient expects to be discharged to:: Private residence Living Arrangements: Children Available Help at Discharge: Family;Available 24 hours/day Type of Home: Apartment Home Access: Elevator     Home Layout: One level         Bathroom Toilet: Standard     Home Equipment: Environmental consultant - 2 wheels;Cane - single point   Additional Comments: Pt states she lives with her daughter      Prior Functioning/Environment Level  of Independence: Independent with assistive device(s)        Comments: Pt is poor historian -  some information taken from chart for previous hospital stay in 2018        OT Problem List: Impaired balance (sitting and/or standing);Decreased safety awareness;Decreased knowledge of use of DME or AE      OT Treatment/Interventions: Self-care/ADL training;Patient/family education;DME and/or AE instruction    OT Goals(Current goals can be found in the care plan section) Acute Rehab OT  Goals Patient Stated Goal: Go home OT Goal Formulation: With patient Time For Goal Achievement: 04/10/18  OT Frequency: Min 2X/week    AM-PAC PT "6 Clicks" Daily Activity     Outcome Measure Help from another person eating meals?: None Help from another person taking care of personal grooming?: A Little Help from another person toileting, which includes using toliet, bedpan, or urinal?: A Little Help from another person bathing (including washing, rinsing, drying)?: A Little Help from another person to put on and taking off regular upper body clothing?: A Little Help from another person to put on and taking off regular lower body clothing?: A Lot 6 Click Score: 18   End of Session Equipment Utilized During Treatment: Rolling walker Nurse Communication: Mobility status  Activity Tolerance: Patient tolerated treatment well Patient left: in bed;with call bell/phone within reach  OT Visit Diagnosis: Unsteadiness on feet (R26.81);Repeated falls (R29.6);Muscle weakness (generalized) (M62.81);History of falling (Z91.81)                Time: 4599-7741 OT Time Calculation (min): 20 min Charges:  OT General Charges $OT Visit: 1 Visit OT Evaluation $OT Eval Moderate Complexity: 1 Mod G-Codes:  Kari Baars, [place]  Payton Mccallum D 03/27/2018, 6:21 PM

## 2018-03-27 NOTE — Progress Notes (Signed)
PHARMACIST - PHYSICIAN COMMUNICATION  DR:   [place]ada Crane  CONCERNING: IV to Oral Route Change Policy  RECOMMENDATION: This patient is receiving thiamine 100 mg daily by the intravenous route.  Based on criteria approved by the Pharmacy and Therapeutics Committee, the intravenous medication(s) is/are being converted to the equivalent oral dose form(s).   DESCRIPTION: These criteria include:  The patient is eating (either orally or via tube) and/or has been taking other orally administered medications for a least 24 hours  The patient has no evidence of active gastrointestinal bleeding or impaired GI absorption (gastrectomy, short bowel, patient on TNA or NPO).  If you have questions about this conversion, please contact the Pharmacy Department   Theodis Shove, PharmD, BCPS Clinical Pharmacist Pager: [phone] 03/27/18 10:54 AM

## 2018-03-27 NOTE — Progress Notes (Signed)
LCSW consulted for SA.  Patient refuses SA resources.   LCSW signing off.   Carolin Coy [place]

## 2018-03-28 LAB — BASIC METABOLIC PANEL
ANION GAP: 12 (ref 5–15)
BUN: 17 mg/dL (ref 8–23)
CALCIUM: 9.4 mg/dL (ref 8.9–10.3)
CO2: 24 mmol/L (ref 22–32)
Chloride: 106 mmol/L (ref 98–111)
Creatinine, Ser: 0.75 mg/dL (ref 0.44–1.00)
Glucose, Bld: 101 mg/dL — ABNORMAL HIGH (ref 70–99)
Potassium: 3.3 mmol/L — ABNORMAL LOW (ref 3.5–5.1)
Sodium: 142 mmol/L (ref 135–145)

## 2018-03-28 LAB — HEPATITIS PANEL, ACUTE
HCV Ab: <0.1 s/co ratio (ref 0.0–0.9)
HEP B S AG: NEGATIVE
Hep A IgM: NEGATIVE
Hep B C IgM: NEGATIVE

## 2018-03-28 LAB — CBC
HEMATOCRIT: 36.3 % (ref 36.0–46.0)
Hemoglobin: 12 g/dL (ref 12.0–15.0)
MCH: 29.9 pg (ref 26.0–34.0)
MCHC: 33.1 g/dL (ref 30.0–36.0)
MCV: 90.3 fL (ref 78.0–100.0)
PLATELETS: 392 10*3/uL (ref 150–400)
RBC: 4.02 MIL/uL (ref 3.87–5.11)
RDW: 14.1 % (ref 11.5–15.5)
WBC: 7 10*3/uL (ref 4.0–10.5)

## 2018-03-28 MED ORDER — CEPHALEXIN 500 MG PO CAPS
500.0000 mg | ORAL_CAPSULE | Freq: Two times a day (BID) | ORAL | Status: DC
  Administered 2018-03-28 (×2): 500 mg via ORAL
  Filled 2018-03-28 (×2): qty 1

## 2018-03-28 NOTE — Progress Notes (Signed)
Occupational Therapy Treatment Patient Details Name: Lindsey [name] MRN: [id number] DOB: 03/16/1956 Today's Date: 03/28/2018    History of present illness Pt admitted s/p seizures and with hx of epilepsy and ETOH abuse.     OT comments  Focused on safety and fall prevention with ADL activity   Follow Up Recommendations  No OT follow up;Supervision/Assistance - 24 hour    Equipment Recommendations  3 in 1 bedside commode    Recommendations for Other Services      Precautions / Restrictions Precautions Precautions: Fall Restrictions Weight Bearing Restrictions: No       Mobility Bed Mobility Overal bed mobility: Needs Assistance Bed Mobility: Supine to Sit     Supine to sit: HOB elevated;Supervision        Transfers Overall transfer level: Needs assistance Equipment used: Rolling walker (2 wheeled) Transfers: Sit to/from [facility] Sit to Stand: Min guard Stand pivot transfers: Min guard       General transfer comment: cues for safety and use of UEs to self assist    Balance Overall balance assessment: Needs assistance Sitting-balance support: Feet supported;Bilateral upper extremity supported Sitting balance-Leahy Scale: Fair     Standing balance support: Bilateral upper extremity supported Standing balance-Leahy Scale: Poor                             ADL either performed or assessed with clinical judgement   ADL Overall ADL's : Needs assistance/impaired     Grooming: Set up;Standing                   Toilet Transfer: Supervision/safety;BSC;Stand-pivot   Toileting- Clothing Manipulation and Hygiene: Supervision/safety;Sit to/from stand       Functional mobility during ADLs: Min guard                 Cognition Arousal/Alertness: Awake/alert Behavior During Therapy: WFL for tasks assessed/performed Overall Cognitive Status: No family/caregiver present to determine baseline cognitive functioning                                                      Pertinent Vitals/ Pain       Pain Assessment: No/denies pain         Frequency  Min 2X/week        Progress Toward Goals  OT Goals(current goals can now be found in the care plan section)  Progress towards OT goals: Progressing toward goals     Plan Discharge plan remains appropriate    Co-evaluation                 AM-PAC PT "6 Clicks" Daily Activity     Outcome Measure   Help from another person eating meals?: None Help from another person taking care of personal grooming?: A Little Help from another person toileting, which includes using toliet, bedpan, or urinal?: A Little Help from another person bathing (including washing, rinsing, drying)?: A Little Help from another person to put on and taking off regular upper body clothing?: A Little Help from another person to put on and taking off regular lower body clothing?: A Lot 6 Click Score: 18    End of Session Equipment Utilized During Treatment: Rolling walker  OT Visit Diagnosis: Unsteadiness on feet (R26.81);Repeated falls (R29.6);Muscle weakness (generalized) (  M62.81);History of falling (Z91.81)   Activity Tolerance Patient tolerated treatment well   Patient Left with call bell/phone within reach;Other (comment)(on BSC- CNA aware)   Nurse Communication Mobility status        Time: 1018-1030 OT Time Calculation (min): 12 min  Charges: OT General Charges $OT Visit: 1 Visit OT Treatments $Self Care/Home Management : 8-22 mins  [place], [place]   Betsy Pries 03/28/2018, 11:07 AM
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
## 2018-03-28 NOTE — Progress Notes (Signed)
Physical Therapy Treatment Patient Details Name: Lindsey [name] MRN: [id number] DOB: April 04, 1956 Today's Date: 03/28/2018    History of Present Illness Pt admitted s/p seizures and with hx of epilepsy and ETOH abuse.      PT Comments    Pt agreeable to ambulate in room, emphasized safety with mobility at home; pt was standing in room without assist moving from [facility] to bed and appeared to be moving without difficulty  On PT arrival), after PT initiated gait pt required incr assist and reported pain and inability to amb more than a short distance in room;   Follow Up Recommendations  Home health PT;Supervision for mobility/OOB     Equipment Recommendations  None recommended by PT    Recommendations for Other Services       Precautions / Restrictions Precautions Precautions: Fall Restrictions Weight Bearing Restrictions: No    Mobility  Bed Mobility Overal bed mobility: Needs Assistance Bed Mobility: Supine to Sit     Supine to sit: HOB elevated;Supervision     General bed mobility comments: EOB  Transfers Overall transfer level: Needs assistance Equipment used: None Transfers: Sit to/from [facility] Sit to Stand: Min guard Stand pivot transfers: Min guard       General transfer comment: cues for safety and use of UEs to self assist to stand  Ambulation/Gait Ambulation/Gait assistance: Mod assist Gait Distance (Feet): 12 Feet(in room) Assistive device: 1 person hand held assist Gait Pattern/deviations: Step-through pattern;Decreased stride length;Staggering left;Staggering right;Narrow base of support     General Gait Details: cues for safety, pt mildly impulsive   Stairs             Wheelchair Mobility    Modified Rankin (Stroke Patients Only)       Balance Overall balance assessment: Needs assistance Sitting-balance support: Feet supported;Bilateral upper extremity supported Sitting balance-Leahy Scale: Fair     Standing  balance support: Single extremity supported Standing balance-Leahy Scale: Poor                              Cognition Arousal/Alertness: Awake/alert Behavior During Therapy: WFL for tasks assessed/performed Overall Cognitive Status: No family/caregiver present to determine baseline cognitive functioning                                        Exercises      General Comments        Pertinent Vitals/Pain Pain Assessment: Faces Faces Pain Scale: Hurts little more Pain Location: LEs Pain Descriptors / Indicators: Grimacing;Discomfort Pain Intervention(s): Monitored during session    Home Living                      Prior Function            PT Goals (current goals can now be found in the care plan section) Acute Rehab PT Goals Patient Stated Goal: Go home PT Goal Formulation: With patient Time For Goal Achievement: 04/09/18 Potential to Achieve Goals: Good Progress towards PT goals: Progressing toward goals    Frequency    Min 3X/week      PT Plan Current plan remains appropriate    Co-evaluation              AM-PAC PT "6 Clicks" Daily Activity  Outcome Measure  Difficulty turning over in bed (including adjusting  bedclothes, sheets and blankets)?: A Lot Difficulty moving from lying on back to sitting on the side of the bed? : A Lot Difficulty sitting down on and standing up from a chair with arms (e.g., wheelchair, bedside commode, etc,.)?: A Lot Help needed moving to and from a bed to chair (including a wheelchair)?: A Lot Help needed walking in hospital room?: A Lot Help needed climbing 3-5 steps with a railing? : A Lot 6 Click Score: 12    End of Session Equipment Utilized During Treatment: Gait belt Activity Tolerance: Patient limited by fatigue Patient left: with call bell/phone within reach(EOB)   PT Visit Diagnosis: Muscle weakness (generalized) (M62.81);Difficulty in walking, not elsewhere classified  (R26.2);Ataxic gait (R26.0);History of falling (Z91.81);Unsteadiness on feet (R26.81)     Time: 5188-4166 PT Time Calculation (min) (ACUTE ONLY): 20 min  Charges:  $Gait Training: 8-22 mins                    G CodesKenyon Ana, PT Pager: [phone] 03/28/2018    Kenyon Ana 03/28/2018, 1:42 PM

## 2018-03-29 DIAGNOSIS — R710 Precipitous drop in hematocrit: Secondary | ICD-10-CM

## 2018-03-29 DIAGNOSIS — F10239 Alcohol dependence with withdrawal, unspecified: Secondary | ICD-10-CM

## 2018-03-29 DIAGNOSIS — I10 Essential (primary) hypertension: Secondary | ICD-10-CM

## 2018-03-29 DIAGNOSIS — K922 Gastrointestinal hemorrhage, unspecified: Secondary | ICD-10-CM

## 2018-03-29 DIAGNOSIS — D649 Anemia, unspecified: Secondary | ICD-10-CM

## 2018-03-29 DIAGNOSIS — G9340 Encephalopathy, unspecified: Secondary | ICD-10-CM

## 2018-03-29 DIAGNOSIS — R569 Unspecified convulsions: Secondary | ICD-10-CM

## 2018-03-29 DIAGNOSIS — Z79899 Other long term (current) drug therapy: Secondary | ICD-10-CM

## 2018-03-29 DIAGNOSIS — E43 Unspecified severe protein-calorie malnutrition: Secondary | ICD-10-CM

## 2018-03-29 LAB — COMPREHENSIVE METABOLIC PANEL
ALBUMIN: 3.3 g/dL — AB (ref 3.5–5.0)
ALK PHOS: 66 U/L (ref 38–126)
ALT: 10 U/L (ref 0–44)
AST: 14 U/L — AB (ref 15–41)
Anion gap: 9 (ref 5–15)
BUN: 17 mg/dL (ref 8–23)
CALCIUM: 9.2 mg/dL (ref 8.9–10.3)
CO2: 28 mmol/L (ref 22–32)
Chloride: 104 mmol/L (ref 98–111)
Creatinine, Ser: 0.62 mg/dL (ref 0.44–1.00)
GFR calc Af Amer: >60 mL/min (ref >60)
GLUCOSE: 90 mg/dL (ref 70–99)
POTASSIUM: 3.7 mmol/L (ref 3.5–5.1)
Sodium: 141 mmol/L (ref 135–145)
Total Bilirubin: 0.7 mg/dL (ref 0.3–1.2)
Total Protein: 6.4 g/dL — ABNORMAL LOW (ref 6.5–8.1)

## 2018-03-29 LAB — MAGNESIUM: MAGNESIUM: 1.8 mg/dL (ref 1.7–2.4)

## 2018-03-29 LAB — CBC
HCT: 33.6 % — ABNORMAL LOW (ref 36.0–46.0)
HEMOGLOBIN: 11.2 g/dL — AB (ref 12.0–15.0)
MCH: 30 pg (ref 26.0–34.0)
MCHC: 33.3 g/dL (ref 30.0–36.0)
MCV: 90.1 fL (ref 78.0–100.0)
Platelets: 374 10*3/uL (ref 150–400)
RBC: 3.73 MIL/uL — AB (ref 3.87–5.11)
RDW: 14.1 % (ref 11.5–15.5)
WBC: 5.7 10*3/uL (ref 4.0–10.5)

## 2018-03-29 LAB — OCCULT BLOOD X 1 CARD TO LAB, STOOL: Fecal Occult Bld: NEGATIVE

## 2018-03-29 MED ORDER — PEG-KCL-NACL-NASULF-NA ASC-C 100 G PO SOLR: 1.0000 | Freq: Once | ORAL | Status: DC

## 2018-03-29 MED ORDER — PANTOPRAZOLE SODIUM 40 MG IV SOLR
40.0000 mg | Freq: Two times a day (BID) | INTRAVENOUS | Status: DC
  Administered 2018-03-29 – 2018-03-30 (×3): 40 mg via INTRAVENOUS
  Filled 2018-03-29 (×3): qty 40

## 2018-03-29 MED ORDER — PEG-KCL-NACL-NASULF-NA ASC-C 100 G PO SOLR: 0.5000 | Freq: Once | ORAL | Status: DC

## 2018-03-29 MED ORDER — PEG-KCL-NACL-NASULF-NA ASC-C 100 G PO SOLR
0.5000 | Freq: Once | ORAL | Status: AC
  Administered 2018-03-29: 100 g via ORAL
  Filled 2018-03-29: qty 1

## 2018-03-29 MED ORDER — POLYETHYLENE GLYCOL 3350 17 G PO PACK: 17.0000 g | PACK | Freq: Every day | ORAL | Status: DC

## 2018-03-29 MED ORDER — OXYCODONE HCL 5 MG PO TABS
5.0000 mg | ORAL_TABLET | ORAL | Status: DC | PRN
  Administered 2018-03-29 – 2018-03-30 (×3): 5 mg via ORAL
  Filled 2018-03-29 (×3): qty 1

## 2018-03-29 MED ORDER — MORPHINE SULFATE (PF) 4 MG/ML IV SOLN: 4.0000 mg | INTRAVENOUS | Status: DC | PRN

## 2018-03-29 MED ORDER — MAGNESIUM OXIDE 400 (241.3 MG) MG PO TABS
400.0000 mg | ORAL_TABLET | Freq: Two times a day (BID) | ORAL | Status: DC
  Administered 2018-03-29 – 2018-03-30 (×3): 400 mg via ORAL
  Filled 2018-03-29 (×3): qty 1

## 2018-03-29 NOTE — Consult Note (Addendum)
Consultation  Referring Provider: [facility] [place] Physician:  [name], MD Primary Gastroenterologist:  none/unassigned.  Reason for Consultation: abdominal  pain, drop in hgb  HPI: Lindsey [name] is a 62 y.o. female who was admitted from home on 03/25/2018 with altered mental status after she had had a seizure.  Patient has history of seizure disorder and apparently is generally on Keppra and Depakote.  She admits now that she had not been taking her medications regularly. Also has history of EtOH abuse.  She tells me that she had not been actively drinking in the past 3 years but drank heavily on her [birth year].  She states "I overdid it".  She does not remember having any abdominal pain over the past couple of weeks.  She says her appetite is been very good her family "makes her eat".  She has been constipated and says she has not had a bowel movement over the past 4 to 5 days which is unusual for her.  She also tells me this morning that she is been noticing some bright red blood from her rectum just since she is been in the hospital.  She says she seen some blood just with straining to urinate. She was not complaining of abdominal pain until I asked her and then said yes she had been having some discomfort in the left and right lower abdomen.  She does not remember whether she had fallen at home or not. No prior GI evaluation to her knowledge.  She is status post C-section and cholecystectomy. No regular aspirin or NSAID use. CT of the abdomen and pelvis was done on admission 03/26/2018 which did show moderate stool, she status post appendectomy and cholecystectomy has a small focal ill-defined hypodensity in the lateral right kidney question secondary to scarring No acute abnormality.  Labs on admission hemoglobin 13.9 hematocrit 41.1 MCV of 89.  LFTs have been normal.  Hepatitis A, B, and C serologies negative Yesterday hemoglobin 12 hematocrit of 36.3 and  today hemoglobin 11.2 hematocrit of 33.6.  Patient's nurse has been unaware of any rectal bleeding.  Hemoccult pending today  [name] history negative for colon cancer and polyps as far she is aware.   Past Medical History:  Diagnosis Date  . Asthma   . H/O ETOH abuse   . Seizure [facility])     Past Surgical History:  Procedure Laterality Date  . ABDOMINAL HYSTERECTOMY    . GALLBLADDER SURGERY      Prior to Admission medications   Medication Sig Start Date End Date Taking? Authorizing Provider  acetaminophen (TYLENOL) 500 MG tablet Take 500 mg by mouth every 8 (eight) hours as needed.   Yes [provider]  albuterol (PROAIR HFA) 108 (90 Base) MCG/ACT inhaler Inhale 2 puffs into the lungs every 6 (six) hours as needed for wheezing or shortness of breath.   Yes [provider]  albuterol (PROVENTIL) (2.5 MG/3ML) 0.083% nebulizer solution Take 2.5 mg by nebulization every 6 (six) hours as needed for wheezing or shortness of breath.   Yes [provider]  cholecalciferol (VITAMIN D) 1000 units tablet Take 1,000 Units by mouth daily.   Yes [provider]  diclofenac sodium (VOLTAREN) 1 % GEL Apply 2 g topically daily as needed (arthritis pain).   Yes [provider]  divalproex (DEPAKOTE) 500 MG DR tablet Take 1 tablet (500 mg total) by mouth 2 (two) times daily. 01/29/18  Yes [name], Jenny Reichmann, MD  folic acid (FOLVITE)  1 MG tablet Take 1 mg by mouth daily.   Yes [provider]  levETIRAcetam (KEPPRA) 750 MG tablet Take 1 tablet (750 mg total) by mouth 2 (two) times daily. 01/29/18  Yes [name], John, MD  LORazepam (ATIVAN) 1 MG tablet Take 1 mg by mouth every 8 (eight) hours as needed for anxiety.   Yes [provider]  magnesium oxide (MAG-OX) 400 MG tablet Take 400 mg by mouth 3 (three) times daily.   Yes [provider]  Multiple Vitamin (MULTIVITAMIN) tablet Take 1 tablet by mouth daily.   Yes [provider]    pantoprazole (PROTONIX) 40 MG tablet Take 1 tablet (40 mg total) by mouth 2 (two) times daily. 08/19/15  Yes Reyne Dumas, MD  thiamine 100 MG tablet Take 1 tablet (100 mg total) by mouth daily. 05/03/17  Yes Aline August, MD  calcium-vitamin D 250-100 MG-UNIT tablet Take 1 tablet by mouth 2 (two) times daily. Patient not taking: Reported on 03/26/2018 01/29/18   [name], Jenny Reichmann, MD    Current Facility-Administered Medications  Medication Dose Route Frequency Provider Last Rate Last Dose  . acetaminophen (TYLENOL) tablet 650 mg  650 mg Oral Q6H PRN Toy Baker, MD   650 mg at 03/29/18 1008   Or  . acetaminophen (TYLENOL) suppository 650 mg  650 mg Rectal Q6H PRN Doutova, Anastassia, MD      . albuterol (PROVENTIL) (2.5 MG/3ML) 0.083% nebulizer solution 2.5 mg  2.5 mg Nebulization Q6H PRN Doutova, Anastassia, MD      . amLODipine (NORVASC) tablet 10 mg  10 mg Oral Daily Hall, Carole N, DO   10 mg at 03/29/18 1008  . bisacodyl (DULCOLAX) suppository 10 mg  10 mg Rectal Daily PRN Toy Baker, MD      . divalproex (DEPAKOTE) DR tablet 500 mg  500 mg Oral BID Toy Baker, MD   500 mg at 03/29/18 1007  . feeding supplement (ENSURE ENLIVE) (ENSURE ENLIVE) liquid 237 mL  237 mL Oral BID BM Hall, Carole N, DO   237 mL at 03/28/18 1415  . labetalol (NORMODYNE,TRANDATE) injection 5 mg  5 mg Intravenous Q2H PRN Irene Pap N, DO      . levETIRAcetam (KEPPRA) tablet 750 mg  750 mg Oral BID Toy Baker, MD   750 mg at 03/29/18 1008  . magnesium oxide (MAG-OX) tablet 400 mg  400 mg Oral BID [name], MD   400 mg at 03/29/18 1008  . milk and molasses enema  1 enema Rectal PRN Toy Baker, MD      . ondansetron (ZOFRAN) tablet 4 mg  4 mg Oral Q6H PRN Toy Baker, MD   4 mg at 03/26/18 2200   Or  . ondansetron (ZOFRAN) injection 4 mg  4 mg Intravenous Q6H PRN Doutova, Anastassia, MD      . oxyCODONE (Oxy IR/ROXICODONE) immediate release tablet 5 mg  5 mg  Oral Q4H PRN [name], MD      . pantoprazole (PROTONIX) injection 40 mg  40 mg Intravenous Q12H [name], MD   40 mg at 03/29/18 1034  . polyethylene glycol (MIRALAX / GLYCOLAX) packet 17 g  17 g Oral Daily [name], MD      . sodium chloride 0.9 % bolus 1,000 mL  1,000 mL Intravenous Once Doutova, Anastassia, MD      . thiamine (VITAMIN B-1) tablet 100 mg  100 mg Oral Daily Lenis Noon, RPH   100 mg  at 03/29/18 1008    Allergies as of 03/25/2018 - Review Complete 01/29/2018  Allergen Reaction Noted  . Kiwi extract Hives and Itching 11/03/2015  . Morphine and related Hives 11/05/2013  . Penicillins Hives and Itching 02/25/2012  . Strawberry extract Hives and Itching 04/25/2015    Family History  Problem Relation Age of Onset  . Seizures Mother   . Alcohol abuse Mother   . Heart attack Father   . Drug abuse Brother   . Cancer Brother        Pancreatic    Social History   Socioeconomic History  . Marital status: Legally Separated    Spouse name: Not on file  . Number of children: 2  . Years of education: 23  . Highest education level: Not on file  Occupational History  . Occupation: Disability  Social Needs  . Financial resource strain: Not on file  . Food insecurity:    Worry: Not on file    Inability: Not on file  . Transportation needs:    Medical: Not on file    Non-medical: Not on file  Tobacco Use  . Smoking status: Current Some Day Smoker    Types: Cigarettes  . Smokeless tobacco: Never Used  Substance and Sexual Activity  . Alcohol use: No    Comment: Hx of alchol abuse, not currently drinking  . Drug use: Yes    Types: Marijuana    Comment: "every now and then"  . Sexual activity: Not on file  Lifestyle  . Physical activity:    Days per week: Not on file    Minutes per session: Not on file  . Stress: Not on file  Relationships  . Social connections:    Talks on phone: Not on file    Gets together: Not on file     Attends religious service: Not on file    Active member of club or organization: Not on file    Attends meetings of clubs or organizations: Not on file    Relationship status: Not on file  . Intimate partner violence:    Fear of current or ex partner: Not on file    Emotionally abused: Not on file    Physically abused: Not on file    Forced sexual activity: Not on file  Other Topics Concern  . Not on file  Social History Narrative   Lives at home w/ her daughter   Right-handed   Caffeine: several coffees and sodas daily    Review of Systems: Pertinent positive and negative review of systems were noted in the above HPI section.  All other review of systems was otherwise negative.  Physical Exam: Vital signs in last 24 hours: Temp:  [97.7 F (36.5 C)-98.1 F (36.7 C)] 98.1 F (36.7 C) (07/03 0619) Pulse Rate:  [84-97] 84 (07/03 0619) Resp:  [15-20] 15 (07/03 0619) BP: (120-158)/(62-78) 120/62 (07/03 0619) SpO2:  [98 %-100 %] 98 % (07/03 0619) Last BM Date: 03/27/18 General:   Alert,  Well-developed, well-nourished,AA female  pleasant and cooperative in NAD Head:  Normocephalic and atraumatic. Eyes:  Sclera clear, no icterus.   Conjunctiva pink. Ears:  Normal auditory acuity. Nose:  No deformity, discharge,  or lesions. Mouth:  No deformity or lesions.   Neck:  Supple; no masses or thyromegaly. Lungs:  Clear throughout to auscultation.   No wheezes, crackles, or rhonchi. Heart:  Regular rate and rhythm; no murmurs, clicks, rubs,  or gallops. Abdomen:  Soft,tenderright and left  LQ , no guarding, BS active,nonpalp mass or hsm.   Rectal:   Small ext hemorrhoids, very anxious with exam , no lesion felt - no gross blood or stool on glove Msk:  Symmetrical without gross deformities. . Pulses:  Normal pulses noted. Extremities:  Without clubbing or edema. Neurologic:  Alert and  oriented x4;  grossly normal neurologically. Skin:  Intact without significant lesions or  rashes.. Psych:  Alert and cooperative. Normal mood and affect.  Intake/Output from previous day: 07/02 0701 - 07/03 0700 In: 600 [P.O.:600] Out: -  Intake/Output this shift: Total I/O In: 240 [P.O.:240] Out: -   Lab Results: Recent Labs    03/28/18 0923 03/29/18 0557  WBC 7.0 5.7  HGB 12.0 11.2*  HCT 36.3 33.6*  PLT 392 374   BMET Recent Labs    03/28/18 0923 03/29/18 0557  NA 142 141  K 3.3* 3.7  CL 106 104  CO2 24 28  GLUCOSE 101* 90  BUN 17 17  CREATININE 0.75 0.62  CALCIUM 9.4 9.2   LFT Recent Labs    03/29/18 0557  PROT 6.4*  ALBUMIN 3.3*  AST 14*  ALT 10  ALKPHOS 66  BILITOT 0.7    IMPRESSION:  #63 62 year old African-American female admitted with altered mental status after seizure Patient has been noncompliant with meds  #2 history of EtOH abuse-patient had a binge  03/23/2018-no overt withdrawal since admit, on withdrawal protocol #3 previous history of DTs #4 status post cholecystectomy, status post appendectomy #5 drift in hemoglobin since admission, normocytic #6 patient reporting hematochezia-no gross blood on my exam today Rule out low-grade bleeding secondary to hemorrhoids, rule out occult colon lesion  #7 obstipation   Plan; serial hemoglobins Follow-up pending Hemoccult Will start a bowel purge with movi prep    Amy Esterwood  03/29/2018, 11:11 AM  ________________________________________________________________________  Velora Heckler GI MD note:  I personally examined the patient, reviewed the data and agree with the assessment and plan described above.  Limited, unwitnessed bleeding, hemocult negative. Seems likely to have been hemorrhoid related. She's getting bowel purge tonight. Please document if any significant bleeding during the purge. If not, then she will be safe for follow up in my office in 6-7 weeks and consider colonoscopy at that point. If significant, witnessed bleeding during the purge, will likely recommend  colonoscopy while inpatient.   Owens Loffler, MD [facility] Pager [phone]

## 2018-03-29 NOTE — Progress Notes (Signed)
Occupational Therapy Treatment Patient Details Name: Lindsey [name] MRN: [id number] DOB: Apr 19, 1956 Today's Date: 03/29/2018    History of present illness Pt admitted s/p seizures and with hx of epilepsy and ETOH abuse.     OT comments  Pt needed increase A this day. Pain in stomach limiting pt. RN aware  Follow Up Recommendations  No OT follow up;Supervision/Assistance - 24 hour    Equipment Recommendations  3 in 1 bedside commode    Recommendations for Other Services      Precautions / Restrictions Precautions Precautions: Fall Restrictions Weight Bearing Restrictions: No       Mobility Bed Mobility Overal bed mobility: Needs Assistance Bed Mobility: Supine to Sit     Supine to sit: HOB elevated;Supervision;Min assist        Transfers Overall transfer level: Needs assistance Equipment used: Rolling walker (2 wheeled) Transfers: Sit to/from [employer] Transfers Sit to Stand: Max assist Stand pivot transfers: Max assist       General transfer comment: pt with pain in stomach this day and needed much more A. Educated NOT to get to [facility] alone    Balance Overall balance assessment: Needs assistance Sitting-balance support: Feet supported;Bilateral upper extremity supported Sitting balance-Leahy Scale: Poor     Standing balance support: Bilateral upper extremity supported Standing balance-Leahy Scale: Poor                             ADL either performed or assessed with clinical judgement   ADL Overall ADL's : Needs assistance/impaired                         Toilet Transfer: Maximal assistance;Cueing for sequencing;Cueing for safety;BSC;Stand-pivot Toilet Transfer Details (indicate cue type and reason): pt needed much INCREASED A this day. RN present and aware.   Toileting- Clothing Manipulation and Hygiene: Maximal assistance;Sit to/from stand;Cueing for safety Toileting - Clothing Manipulation Details (indicate cue type and  reason): pt needed INCREASED A this day       General ADL Comments: Pt needed much needed A this day.  RN present and OT shared difference in pts mobility versus yesterday.       Vision Patient Visual Report: No change from baseline            Cognition Arousal/Alertness: Awake/alert Behavior During Therapy: WFL for tasks assessed/performed Overall Cognitive Status: No family/caregiver present to determine baseline cognitive functioning                                                     Pertinent Vitals/ Pain       Faces Pain Scale: Hurts whole lot Pain Location: stomach Pain Descriptors / Indicators: Stabbing Pain Intervention(s): Limited activity within patient's tolerance;Monitored during session;Repositioned;Patient requesting pain meds-RN notified  Home Living                                              Frequency  Min 2X/week        Progress Toward Goals  OT Goals(current goals can now be found in the care plan section)  Progress towards OT goals: Not progressing toward goals - comment(pain  limiting pt this day)     Plan Discharge plan remains appropriate;Discharge plan needs to be updated(pt needed increased A? may need ST SNF?)    Co-evaluation                 AM-PAC PT "6 Clicks" Daily Activity     Outcome Measure   Help from another person eating meals?: None Help from another person taking care of personal grooming?: A Little Help from another person toileting, which includes using toliet, bedpan, or urinal?: A Little Help from another person bathing (including washing, rinsing, drying)?: A Little Help from another person to put on and taking off regular upper body clothing?: A Little Help from another person to put on and taking off regular lower body clothing?: A Lot 6 Click Score: 18    End of Session Equipment Utilized During Treatment: Rolling walker  OT Visit Diagnosis: Unsteadiness on feet  (R26.81);Repeated falls (R29.6);Muscle weakness (generalized) (M62.81);History of falling (Z91.81)   Activity Tolerance Patient tolerated treatment well   Patient Left with call bell/phone within reach;Other (comment)(on BSC- CNA aware)   Nurse Communication Mobility status        Time: [phone] OT Time Calculation (min): 19 min  Charges: OT General Charges $OT Visit: 1 Visit OT Treatments $Self Care/Home Management : 8-22 mins  [place], [place]   Betsy Pries 03/29/2018, 4:44 PM

## 2018-03-29 NOTE — Progress Notes (Signed)
[facility] PROGRESS NOTE    Progress Note  Lindsey [name]  PJK:[id number] DOB: 1956-06-10 DOA: 03/25/2018 PCP: [name], MD     Brief Narrative:   Lindsey [name] is an 62 y.o. female past medical history significant for alcohol abuse, seizure disorder noncompliance with her medications comes to the ED for possible seizure, as she was found postictal  Assessment/Plan:   Acute encephalopathy like due to   Seizure secondary to subtherapeutic anticonvulsant medication ([place]): Unclear of her baseline, as per daughter the patient has been noncompliant with her seizure medication and she lives alone. Antiseizure medications were resumed no events overnight As per RN has a warrant for arrest Daughter made aware. UDS was positive for barbiturates on admission  Seizure disorder: Continue Keppra and Depakote no events overnight.  Medication noncompliance: She will need assistance with her medication due to her history of dementia  History of alcohol abuse: Continue alcohol withdrawal protocol As her worker has been consulted  Protein-calorie malnutrition, severe ([place]) Ensure 3 times daily  Essential hypertension  Drift in hemoglobin: Consult GI.   DVT prophylaxis: scd Family Communication:none Disposition Plan/Barrier to D/C: home in 2 days Code Status:     Code Status Orders  (From admission, onward)        Start     Ordered   03/26/18 0258  Full code  Continuous     03/26/18 0257    Code Status History    Date Active Date Inactive Code Status Order ID Comments User Context   05/01/2017 1826 05/03/2017 2230 Full Code [id number]  Vianne Bulls, MD ED   06/25/2016 0259 06/26/2016 2244 Full Code [id number]  Hugelmeyer, Alexis, DO Inpatient   11/02/2015 1932 11/06/2015 1812 Full Code [id number]  Greta Doom, MD Inpatient   10/10/2015 2059 10/13/2015 1918 Full Code [id number]  Reubin Milan, MD Inpatient   08/15/2015 1525 08/19/2015 1930 Full Code  [id number]  [name], MD ED   03/01/2015 2005 03/08/2015 1811 Full Code [id number]  Etta Quill, DO ED   02/06/2015 0603 02/07/2015 1847 Full Code [id number]  Lavina Hamman, MD ED   01/18/2015 1143 01/19/2015 1733 Full Code [id number]  Orson Eva, MD Inpatient   08/16/2014 1700 08/18/2014 1815 Full Code [id number]  Varney Biles, MD ED   03/04/2014 1940 03/06/2014 1528 Full Code [id number]  Barton Dubois, MD Inpatient   11/06/2013 0146 11/09/2013 1355 Full Code [id number]  Berle Mull, MD Inpatient        IV Access:    Peripheral IV   Procedures and diagnostic studies:   No results found.   Medical Consultants:    None.  Anti-Infectives:   None  Subjective:    [facility] complaining of abd pain.  Objective:    Vitals:   03/28/18 0603 03/28/18 1411 03/28/18 1936 03/29/18 0619  BP: (!) 143/82 136/76 (!) 158/78 120/62  Pulse: 86 90 97 84  Resp: (!) 21 18 20 15   Temp: (!) 97.4 F (36.3 C) 97.7 F (36.5 C) 97.7 F (36.5 C) 98.1 F (36.7 C)  TempSrc:  Oral    SpO2: 100% 99% 100% 98%  Weight:      Height:        Intake/Output Summary (Last 24 hours) at 03/29/2018 0754 Last data filed at 03/28/2018 1200 Gross per 24 hour  Intake 600 ml  Output -  Net 600 ml   Filed Weights   03/26/18 0300  Weight: 68.2 kg (150 lb 5.7 oz)  Exam: General exam: In no acute distress. Respiratory system: Good air movement and clear to auscultation. Cardiovascular system: S1 & S2 heard, RRR.  Gastrointestinal system: Abdomen is nondistended, soft and tender in epigastric area Central nervous system: Alert and oriented. No focal neurological deficits. Extremities: No pedal edema. Skin: No rashes, lesions or ulcers Psychiatry: Judgement and insight appear normal. Mood & affect appropriate.    Data Reviewed:    Labs: Basic Metabolic Panel: Recent Labs  Lab 03/25/18 1612 03/25/18 1616 03/26/18 0338 03/28/18 0923 03/29/18 0557  NA 141 142 142 142 141  K  3.7 3.7 3.9 3.3* 3.7  CL 104 103 108 106 104  CO2 28  --  24 24 28   GLUCOSE 103* 102* 90 101* 90  BUN 17 16 18 17 17   CREATININE 0.76 0.70 0.79 0.75 0.62  CALCIUM 9.4  --  9.2 9.4 9.2  MG  --   --  2.1  --  1.8  PHOS  --   --  2.9  --   --    GFR Estimated Creatinine Clearance: 66.5 mL/min (by C-G formula based on SCr of 0.62 mg/dL). Liver Function Tests: Recent Labs  Lab 03/25/18 1612 03/26/18 0338 03/29/18 0557  AST 26 22 14*  ALT 14 15 10   ALKPHOS 98 91 66  BILITOT 0.7 1.0 0.7  PROT 7.8 7.9 6.4*  ALBUMIN 4.0 4.0 3.3*   No results for input(s): LIPASE, AMYLASE in the last 168 hours. No results for input(s): AMMONIA in the last 168 hours. Coagulation profile No results for input(s): INR, PROTIME in the last 168 hours.  CBC: Recent Labs  Lab 03/25/18 1612 03/25/18 1616 03/26/18 0338 03/28/18 0923 03/29/18 0557  WBC 11.6*  --  10.1 7.0 5.7  NEUTROABS 10.1*  --   --   --   --   HGB 13.9 14.6 14.2 12.0 11.2*  HCT 41.1 43.0 42.4 36.3 33.6*  MCV 89.2  --  89.1 90.3 90.1  PLT 475*  --  411* 392 374   Cardiac Enzymes: No results for input(s): CKTOTAL, CKMB, CKMBINDEX, TROPONINI in the last 168 hours. BNP (last 3 results) No results for input(s): PROBNP in the last 8760 hours. CBG: Recent Labs  Lab 03/25/18 1526  GLUCAP 104*   D-Dimer: No results for input(s): DDIMER in the last 72 hours. Hgb A1c: No results for input(s): HGBA1C in the last 72 hours. Lipid Profile: No results for input(s): CHOL, HDL, LDLCALC, TRIG, CHOLHDL, LDLDIRECT in the last 72 hours. Thyroid function studies: No results for input(s): TSH, T4TOTAL, T3FREE, THYROIDAB in the last 72 hours.  Invalid input(s): FREET3 Anemia work up: No results for input(s): VITAMINB12, FOLATE, FERRITIN, TIBC, IRON, RETICCTPCT in the last 72 hours. Sepsis Labs: Recent Labs  Lab 03/25/18 1612 03/26/18 0338 03/28/18 0923 03/29/18 0557  WBC 11.6* 10.1 7.0 5.7   Microbiology Recent Results (from the  past 240 hour(s))  MRSA PCR Screening     Status: None   Collection Time: 03/26/18  2:58 AM  Result Value Ref Range Status   MRSA by PCR NEGATIVE NEGATIVE Final    Comment:        The GeneXpert MRSA Assay (FDA approved for NASAL specimens only), is one component of a comprehensive MRSA colonization surveillance program. It is not intended to diagnose MRSA infection nor to guide or monitor treatment for MRSA infections. Performed at [facility], [place] [address], [place], [place] [address]   Urine Culture  Status: Abnormal   Collection Time: 03/26/18  2:58 AM  Result Value Ref Range Status   Specimen Description   Final    URINE, CLEAN CATCH Performed at [facility], [place] [address], [place], [place] [address]    Special Requests   Final    NONE Performed at [facility], [place] [address], [place], [place] [address]    Culture MULTIPLE SPECIES PRESENT, SUGGEST RECOLLECTION (A)  Final   Report Status 03/27/2018 FINAL  Final     Medications:   . amLODipine  10 mg Oral Daily  . cephALEXin  500 mg Oral Q12H  . divalproex  500 mg Oral BID  . enoxaparin (LOVENOX) injection  40 mg Subcutaneous Q24H  . feeding supplement (ENSURE ENLIVE)  237 mL Oral BID BM  . levETIRAcetam  750 mg Oral BID  . pantoprazole  40 mg Oral BID  . thiamine  100 mg Oral Daily   Continuous Infusions: . sodium chloride        LOS: 3 days   [name]  [facility] Pager [phone]  *Please refer to [email or web address], password [email or web address] to get updated schedule on who will round on this patient, as hospitalists switch teams weekly. If 7PM-7AM, please contact night-coverage at [email or web address], password [email or web address] for any overnight needs.  03/29/2018, 7:54 AM

## 2018-03-30 DIAGNOSIS — F1910 Other psychoactive substance abuse, uncomplicated: Secondary | ICD-10-CM

## 2018-03-30 DIAGNOSIS — D649 Anemia, unspecified: Secondary | ICD-10-CM

## 2018-03-30 LAB — CBC
HEMATOCRIT: 34.9 % — AB (ref 36.0–46.0)
Hemoglobin: 11.7 g/dL — ABNORMAL LOW (ref 12.0–15.0)
MCH: 30.2 pg (ref 26.0–34.0)
MCHC: 33.5 g/dL (ref 30.0–36.0)
MCV: 90.2 fL (ref 78.0–100.0)
PLATELETS: 447 10*3/uL — AB (ref 150–400)
RBC: 3.87 MIL/uL (ref 3.87–5.11)
RDW: 14.3 % (ref 11.5–15.5)
WBC: 6.1 10*3/uL (ref 4.0–10.5)

## 2018-03-30 MED ORDER — AMLODIPINE BESYLATE 10 MG PO TABS: 10.0000 mg | ORAL_TABLET | Freq: Every day | ORAL | 0 refills | Status: DC

## 2018-03-30 MED ORDER — PANTOPRAZOLE SODIUM 40 MG PO TBEC: 40.0000 mg | DELAYED_RELEASE_TABLET | Freq: Two times a day (BID) | ORAL | 1 refills | Status: DC

## 2018-03-30 NOTE — Progress Notes (Addendum)
Patient ID: Lindsey [name], female   DOB: 1956-02-26, 62 y.o.   MRN: [id number]    Progress Note   Subjective   C/o abdominal discomfort with bowel prep - had several BM's  Stool heme neg yesterday  HGB 11.7 - stable Pt  voicing that she does not want to go home , daughter sick, no transportation , etc   Objective   Vital signs in last 24 hours: Temp:  [97.6 F (36.4 C)-98.4 F (36.9 C)] 97.6 F (36.4 C) (07/04 0357) Pulse Rate:  [75-84] 76 (07/04 0357) Resp:  [14-21] 21 (07/04 0357) BP: (118-159)/(58-105) 159/78 (07/04 0357) SpO2:  [100 %] 100 % (07/04 0357) Last BM Date: 03/30/18 General:    AA female in NAD Heart:  Regular rate and rhythm; no murmurs Lungs: Respirations even and unlabored, lungs CTA bilaterally Abdomen:  Soft, nontender and nondistended. Normal bowel sounds. Extremities:  Without edema. Neurologic:  Alert and oriented,  grossly normal neurologically. Psych:  Cooperative. Normal mood and affect anxious   Lab Results: Recent Labs    03/28/18 0923 03/29/18 0557 03/30/18 0601  WBC 7.0 5.7 6.1  HGB 12.0 11.2* 11.7*  HCT 36.3 33.6* 34.9*  PLT 392 374 447*   BMET Recent Labs    03/28/18 0923 03/29/18 0557  NA 142 141  K 3.3* 3.7  CL 106 104  CO2 24 28  GLUCOSE 101* 90  BUN 17 17  CREATININE 0.75 0.62  CALCIUM 9.4 9.2   LFT Recent Labs    03/29/18 0557  PROT 6.4*  ALBUMIN 3.3*  AST 14*  ALT 10  ALKPHOS 66  BILITOT 0.7   PT/INR No results for input(s): LABPROT, INR in the last 72 hours.  Studies/Results: No results found.     Assessment / Plan:     #1 62 yo AA female admitted with AMS - post seizure  Hx seizure disorder - non compliant with meds recently and had also had binge with ETOH  #2 hx ETOH abuse #3 mild anemia - multifactorial - no evidence for GI bleeding  #4 Obstipation - resolved with bowel purge - would send home on Miralax 17 gm in 8 oz water daily  Plan ;   Pt does not need any further GI work up  In  hospital  She should have Colon screening  Done which can be done outpt -  With Dr Ardis Hughs - will have office get her an appt    At our office . GI will sign off   Contact  Amy Lebanon, P.A.-C               [phone]     ________________________________________________________________________  Velora Heckler GI MD note:  I personally examined the patient, reviewed the data and agree with the assessment and plan described above.  Not clear if she has had any overt bleeding here in the hospital and stool is heme negative.  Certainly should follow up as an outpatient to get her up to date on colon cancer screening.   Please call or page with any further questions or concerns.    Owens Loffler, MD [facility] Pager [phone]

## 2018-03-30 NOTE — Progress Notes (Signed)
Received message from [place] that pt daughter requesting call from department leadership regarding mother's charges.  Called pt daughter and spoke with Timmothy Sours who verbalized frustration regarding her mothers release into police custody due to staff charges.  Leadership allowed daughter to vent frustrations and encouraged daughter to contact law enforcement for more details.

## 2018-03-30 NOTE — Progress Notes (Signed)
Pt.A&OX 4,refused portion of the Moviprep ordered ,no bloody stool noted during the evenig.This morning pt.had a bowel movent  and the stool contains  than a few drops of pinkish blood noted.

## 2018-03-30 NOTE — Discharge Summary (Signed)
Physician Discharge Summary  Gerturde Kuba PFX:[id number] DOB: 1956-03-03 DOA: 03/25/2018  PCP: [name], MD  Admit date: 03/25/2018 Discharge date: 03/30/2018  Admitted From: home Disposition:  Home  Recommendations for Outpatient Follow-up:  1. Follow up with GI in 1-2 weeks 2.   Home Health:no Equipment/Devices:none  Discharge Condition:stable CODE STATUS:full Diet recommendation: Heart Healthy  Brief/Interim Summary: 62 y.o. female past medical history significant for alcohol abuse, seizure disorder noncompliance with her medications comes to the ED for possible seizure, as she was found postictal  Discharge Diagnoses:  Active Problems:   Acute encephalopathy   Alcohol abuse   Protein-calorie malnutrition, severe (HCC)   Alcohol dependence with withdrawal with complication (HCC)   Polysubstance abuse (HCC)   Seizure secondary to subtherapeutic anticonvulsant medication ([place])   Essential hypertension   Alcohol withdrawal seizure ([place])   Tobacco abuse   Alcohol withdrawal delirium ([place])  Acute encephalopathy likely due to seizures secondary to subtherapeutic anticonvulsive medications due to noncompliance: As per daughter the patient has been noncompliant with her medication she lives alone. She was loaded with antiseizure medications and she had no further events, she will continue her home regimen no changes were made.  Seizure disorder see above for further details:  Medication noncompliance: I was spoke to the her daughter that she will need assistance with her medications at home due to her mild dementia.  History of alcohol abuse: No signs of withdrawal.  Protein caloric malnutrition: She can continue Ensure 3 times a day at home.  Essential hypertension: No changes in her medication.  Normocytic anemia: She relates some bright red blood stools per rectum her hemoglobin dropped from 14-11 in the hospital there were no signs of further bleeding GI  was consulted a CBC was repleted and her hemoglobin remained stable. GI recommended to follow-up with them as an outpatient.  Discharge Instructions  Discharge Instructions    Diet - low sodium heart healthy   Complete by:  As directed    Increase activity slowly   Complete by:  As directed      Allergies as of 03/30/2018      Reactions   Kiwi Extract Hives, Itching   Morphine And Related Hives   Penicillins Hives, Other (See Comments)   Has patient had a PCN reaction causing immediate rash, facial/tongue/throat swelling, SOB or lightheadedness with hypotension: Yes Has patient had a PCN reaction causing severe rash involving mucus membranes or skin necrosis: No Has patient had a PCN reaction that required hospitalization No Has patient had a PCN reaction occurring within the last 10 years: No If all of the above answers are "NO", then may proceed with Cephalosporin use.   Strawberry Extract Hives, Itching      Medication List    TAKE these medications   acetaminophen 500 MG tablet Commonly known as:  TYLENOL Take 500 mg by mouth every 8 (eight) hours as needed.   amLODipine 10 MG tablet Commonly known as:  NORVASC Take 1 tablet (10 mg total) by mouth daily.   calcium-vitamin D 250-100 MG-UNIT tablet Take 1 tablet by mouth 2 (two) times daily.   cholecalciferol 1000 units tablet Commonly known as:  VITAMIN D Take 1,000 Units by mouth daily.   diclofenac sodium 1 % Gel Commonly known as:  VOLTAREN Apply 2 g topically daily as needed (arthritis pain).   divalproex 500 MG DR tablet Commonly known as:  DEPAKOTE Take 1 tablet (500 mg total) by mouth 2 (two) times daily.  folic acid 1 MG tablet Commonly known as:  FOLVITE Take 1 mg by mouth daily.   levETIRAcetam 750 MG tablet Commonly known as:  KEPPRA Take 1 tablet (750 mg total) by mouth 2 (two) times daily.   LORazepam 1 MG tablet Commonly known as:  ATIVAN Take 1 mg by mouth every 8 (eight) hours as needed  for anxiety.   magnesium oxide 400 MG tablet Commonly known as:  MAG-OX Take 400 mg by mouth 3 (three) times daily.   multivitamin tablet Take 1 tablet by mouth daily.   pantoprazole 40 MG tablet Commonly known as:  PROTONIX Take 1 tablet (40 mg total) by mouth 2 (two) times daily. What changed:  Another medication with the same name was added. Make sure you understand how and when to take each.   pantoprazole 40 MG tablet Commonly known as:  PROTONIX Take 1 tablet (40 mg total) by mouth 2 (two) times daily. What changed:  You were already taking a medication with the same name, and this prescription was added. Make sure you understand how and when to take each.   PROAIR HFA 108 (90 Base) MCG/ACT inhaler Generic drug:  albuterol Inhale 2 puffs into the lungs every 6 (six) hours as needed for wheezing or shortness of breath.   albuterol (2.5 MG/3ML) 0.083% nebulizer solution Commonly known as:  PROVENTIL Take 2.5 mg by nebulization every 6 (six) hours as needed for wheezing or shortness of breath.   thiamine 100 MG tablet Take 1 tablet (100 mg total) by mouth daily.       Allergies  Allergen Reactions  . Kiwi Extract Hives and Itching  . Morphine And Related Hives  . Penicillins Hives and Other (See Comments)    Has patient had a PCN reaction causing immediate rash, facial/tongue/throat swelling, SOB or lightheadedness with hypotension: Yes Has patient had a PCN reaction causing severe rash involving mucus membranes or skin necrosis: No Has patient had a PCN reaction that required hospitalization No Has patient had a PCN reaction occurring within the last 10 years: No If all of the above answers are "NO", then may proceed with Cephalosporin use.  . Strawberry Extract Hives and Itching    Consultations:  GI   Procedures/Studies: Ct Head Wo Contrast  Result Date: 03/26/2018 CLINICAL DATA:  62 year old female with altered mental status. EXAM: CT HEAD WITHOUT CONTRAST  TECHNIQUE: Contiguous axial images were obtained from the base of the skull through the vertex without intravenous contrast. COMPARISON:  Head CT dated 05/01/2017 FINDINGS: Brain: Minimal age-related atrophy and chronic microvascular ischemic changes. There is no acute intracranial hemorrhage. No mass effect or midline shift. No extra-axial fluid collection. Vascular: No hyperdense vessel or unexpected calcification. Skull: Normal. Negative for fracture or focal lesion. Sinuses/Orbits: No acute finding. Other: None IMPRESSION: No acute intracranial pathology. Mild age-related atrophy and chronic microvascular ischemic changes. Electronically Signed   By: Anner Crete M.D.   On: 03/26/2018 01:04   Ct Abdomen Pelvis W Contrast  Result Date: 03/26/2018 CLINICAL DATA:  62 year old female with abdominal distension. EXAM: CT ABDOMEN AND PELVIS WITH CONTRAST TECHNIQUE: Multidetector CT imaging of the abdomen and pelvis was performed using the standard protocol following bolus administration of intravenous contrast. CONTRAST:  197mL ISOVUE-300 IOPAMIDOL (ISOVUE-300) INJECTION 61% COMPARISON:  CT of the abdomen pelvis dated 03/03/2015 and MRI dated 03/03/2015 FINDINGS: Lower chest: The visualized lung bases are clear. No intra-abdominal free air or free fluid. Hepatobiliary: The liver is unremarkable. No intrahepatic biliary ductal  dilatation. Cholecystectomy. Pancreas: Unremarkable. No pancreatic ductal dilatation or surrounding inflammatory changes. Spleen: Normal in size without focal abnormality. Adrenals/Urinary Tract: The adrenal glands are unremarkable. There is no hydronephrosis or nephrolithiasis on either side. There is a 3 cm right renal upper pole cyst. There is a 1.4 x 1.1 x 1.1 cm ill-defined low attenuating area in the lateral interpolar right kidney inferior to the cyst (series 2, image 17). This area is not well characterized but likely represents scarring sequela of prior hemorrhage. A neoplastic  process is less likely. Further evaluation with MRI recommended. Multiple scattered subcentimeter bilateral hypodense lesions are too small to characterize. The visualized ureters and urinary bladder appear unremarkable. Stomach/Bowel: Moderate stool noted in the rectal vault. There is no bowel obstruction or active inflammation. Appendectomy. Vascular/Lymphatic: Mild aortoiliac atherosclerotic disease. The IVC is unremarkable. No portal venous gas. Top-normal left retroperitoneal/para-aortic lymph node measures up to 10 mm (coronal series 5, image 35). No adenopathy. Reproductive: Hysterectomy.  No pelvic mass. Other: None Musculoskeletal: Osteopenia.  No acute osseous pathology. IMPRESSION: 1. No acute intra-abdominopelvic pathology. No bowel obstruction or active inflammation. 2. Focal ill-defined hypodensity in the lateral interpolar right kidney likely represent scarring and sequela of prior hemorrhage. Further evaluation with MRI is recommended to exclude a neoplastic process. Electronically Signed   By: Anner Crete M.D.   On: 03/26/2018 01:48    (Echo, Carotid, EGD, Colonoscopy, ERCP)    Subjective:   Discharge Exam: Vitals:   03/29/18 2203 03/30/18 0357  BP: (!) 153/83 (!) 159/78  Pulse:  76  Resp:  (!) 21  Temp:  97.6 F (36.4 C)  SpO2:  100%   Vitals:   03/29/18 1445 03/29/18 1950 03/29/18 2203 03/30/18 0357  BP: (!) 118/58 (!) 121/105 (!) 153/83 (!) 159/78  Pulse: 84 75  76  Resp: 14 18  (!) 21  Temp: 98.4 F (36.9 C) 98 F (36.7 C)  97.6 F (36.4 C)  TempSrc: Oral   Oral  SpO2: 100% 100%  100%  Weight:      Height:        General: Pt is alert, awake, not in acute distress Cardiovascular: RRR, S1/S2 +, no rubs, no gallops Respiratory: CTA bilaterally, no wheezing, no rhonchi Abdominal: Soft, NT, ND, bowel sounds + Extremities: no edema, no cyanosis    The results of significant diagnostics from this hospitalization (including imaging, microbiology, ancillary  and laboratory) are listed below for reference.     Microbiology: Recent Results (from the past 240 hour(s))  MRSA PCR Screening     Status: None   Collection Time: 03/26/18  2:58 AM  Result Value Ref Range Status   MRSA by PCR NEGATIVE NEGATIVE Final    Comment:        The GeneXpert MRSA Assay (FDA approved for NASAL specimens only), is one component of a comprehensive MRSA colonization surveillance program. It is not intended to diagnose MRSA infection nor to guide or monitor treatment for MRSA infections. Performed at [facility], [place] [address] [place], [place] [address]   Urine Culture     Status: Abnormal   Collection Time: 03/26/18  2:58 AM  Result Value Ref Range Status   Specimen Description   Final    URINE, CLEAN CATCH Performed at [facility], [place] [address] [place], [place] [address]    Special Requests   Final    NONE Performed at [facility], [place] [address], [place], [place] [address]  Culture MULTIPLE SPECIES PRESENT, SUGGEST RECOLLECTION (A)  Final   Report Status 03/27/2018 FINAL  Final     Labs: BNP (last 3 results) No results for input(s): BNP in the last 8760 hours. Basic Metabolic Panel: Recent Labs  Lab 03/25/18 1612 03/25/18 1616 03/26/18 0338 03/28/18 0923 03/29/18 0557  NA 141 142 142 142 141  K 3.7 3.7 3.9 3.3* 3.7  CL 104 103 108 106 104  CO2 28  --  24 24 28   GLUCOSE 103* 102* 90 101* 90  BUN 17 16 18 17 17   CREATININE 0.76 0.70 0.79 0.75 0.62  CALCIUM 9.4  --  9.2 9.4 9.2  MG  --   --  2.1  --  1.8  PHOS  --   --  2.9  --   --    Liver Function Tests: Recent Labs  Lab 03/25/18 1612 03/26/18 0338 03/29/18 0557  AST 26 22 14*  ALT 14 15 10   ALKPHOS 98 91 66  BILITOT 0.7 1.0 0.7  PROT 7.8 7.9 6.4*  ALBUMIN 4.0 4.0 3.3*   No results for input(s): LIPASE, AMYLASE in the last 168 hours. No results for input(s): AMMONIA in the last 168 hours. CBC: Recent  Labs  Lab 03/25/18 1612 03/25/18 1616 03/26/18 0338 03/28/18 0923 03/29/18 0557 03/30/18 0601  WBC 11.6*  --  10.1 7.0 5.7 6.1  NEUTROABS 10.1*  --   --   --   --   --   HGB 13.9 14.6 14.2 12.0 11.2* 11.7*  HCT 41.1 43.0 42.4 36.3 33.6* 34.9*  MCV 89.2  --  89.1 90.3 90.1 90.2  PLT 475*  --  411* 392 374 447*   Cardiac Enzymes: No results for input(s): CKTOTAL, CKMB, CKMBINDEX, TROPONINI in the last 168 hours. BNP: Invalid input(s): POCBNP CBG: Recent Labs  Lab 03/25/18 1526  GLUCAP 104*   D-Dimer No results for input(s): DDIMER in the last 72 hours. Hgb A1c No results for input(s): HGBA1C in the last 72 hours. Lipid Profile No results for input(s): CHOL, HDL, LDLCALC, TRIG, CHOLHDL, LDLDIRECT in the last 72 hours. Thyroid function studies No results for input(s): TSH, T4TOTAL, T3FREE, THYROIDAB in the last 72 hours.  Invalid input(s): FREET3 Anemia work up No results for input(s): VITAMINB12, FOLATE, FERRITIN, TIBC, IRON, RETICCTPCT in the last 72 hours. Urinalysis    Component Value Date/Time   COLORURINE YELLOW 03/25/2018 2338   APPEARANCEUR HAZY (A) 03/25/2018 2338   APPEARANCEUR Clear 01/28/2014 1937   LABSPEC 1.020 03/25/2018 2338   LABSPEC 1.015 01/28/2014 1937   PHURINE 6.0 03/25/2018 2338   GLUCOSEU NEGATIVE 03/25/2018 2338   GLUCOSEU Negative 01/28/2014 1937   HGBUR MODERATE (A) 03/25/2018 2338   BILIRUBINUR NEGATIVE 03/25/2018 2338   BILIRUBINUR Negative 01/28/2014 1937   KETONESUR 20 (A) 03/25/2018 2338   PROTEINUR 30 (A) 03/25/2018 2338   UROBILINOGEN 0.2 03/01/2015 1935   NITRITE NEGATIVE 03/25/2018 2338   LEUKOCYTESUR LARGE (A) 03/25/2018 2338   LEUKOCYTESUR Negative 01/28/2014 1937   Sepsis Labs Invalid input(s): PROCALCITONIN,  WBC,  LACTICIDVEN Microbiology Recent Results (from the past 240 hour(s))  MRSA PCR Screening     Status: None   Collection Time: 03/26/18  2:58 AM  Result Value Ref Range Status   MRSA by PCR NEGATIVE NEGATIVE  Final    Comment:        The GeneXpert MRSA Assay (FDA approved for NASAL specimens only), is one component of a comprehensive MRSA colonization surveillance program. It  is not intended to diagnose MRSA infection nor to guide or monitor treatment for MRSA infections. Performed at [facility], [place] [address], [place], [place] [address]   Urine Culture     Status: Abnormal   Collection Time: 03/26/18  2:58 AM  Result Value Ref Range Status   Specimen Description   Final    URINE, CLEAN CATCH Performed at [facility], [place] [address] [place], [place] [address]    Special Requests   Final    NONE Performed at [facility], [place] [address] [place], [place] [address]    Culture MULTIPLE SPECIES PRESENT, SUGGEST RECOLLECTION (A)  Final   Report Status 03/27/2018 FINAL  Final     Time coordinating discharge: 35 minutes  SIGNED:   [name], MD  [facility] 03/30/2018, 9:59 AM Pager   If 7PM-7AM, please contact night-coverage [email or web address] Password [email or web address]

## 2018-03-30 NOTE — Progress Notes (Signed)
Pts IV removed with a clean and dry dressing intact. [employer] contacted at d/c to obtain pt due to a warrant. Family notified.

## 2018-03-31 ENCOUNTER — Telehealth: Payer: Self-pay

## 2018-03-31 NOTE — Telephone Encounter (Signed)
-----   Message from Alfredia Ferguson, PA-C sent at 03/30/2018 10:09 AM EDT ----- Regarding: office appt Please get pt an appt with Dr Ardis Hughs  In 3-4 weeks  To discuss/schedule Colonoscopy for screening.Was seen in consult at hospital - does not need an urgent appt

## 2018-03-31 NOTE — Telephone Encounter (Signed)
06/06/18 830 am follow up with Dr Ardis Hughs letter with appt mailed to pt

## 2018-05-21 ENCOUNTER — Observation Stay (HOSPITAL_COMMUNITY)
Admission: EM | Admit: 2018-05-21 | Discharge: 2018-05-22 | Disposition: A | Discharge status: Home / Self Care | Payer: Medicaid Other | Attending: Family Medicine | Admitting: Family Medicine

## 2018-05-21 ENCOUNTER — Emergency Department (HOSPITAL_COMMUNITY): Payer: Medicaid Other

## 2018-05-21 ENCOUNTER — Encounter (HOSPITAL_COMMUNITY): Payer: Self-pay

## 2018-05-21 ENCOUNTER — Other Ambulatory Visit: Payer: Self-pay

## 2018-05-21 DIAGNOSIS — F17210 Nicotine dependence, cigarettes, uncomplicated: Secondary | ICD-10-CM | POA: Diagnosis not present

## 2018-05-21 DIAGNOSIS — R569 Unspecified convulsions: Secondary | ICD-10-CM | POA: Diagnosis not present

## 2018-05-21 DIAGNOSIS — J45909 Unspecified asthma, uncomplicated: Secondary | ICD-10-CM | POA: Diagnosis not present

## 2018-05-21 DIAGNOSIS — F10239 Alcohol dependence with withdrawal, unspecified: Secondary | ICD-10-CM | POA: Diagnosis not present

## 2018-05-21 DIAGNOSIS — F10939 Alcohol use, unspecified with withdrawal, unspecified: Secondary | ICD-10-CM

## 2018-05-21 DIAGNOSIS — Z79899 Other long term (current) drug therapy: Secondary | ICD-10-CM | POA: Diagnosis not present

## 2018-05-21 DIAGNOSIS — Z91199 Patient's noncompliance with other medical treatment and regimen due to unspecified reason: Secondary | ICD-10-CM

## 2018-05-21 DIAGNOSIS — I10 Essential (primary) hypertension: Secondary | ICD-10-CM | POA: Insufficient documentation

## 2018-05-21 DIAGNOSIS — G40901 Epilepsy, unspecified, not intractable, with status epilepticus: Secondary | ICD-10-CM

## 2018-05-21 DIAGNOSIS — Z9119 Patient's noncompliance with other medical treatment and regimen: Secondary | ICD-10-CM | POA: Diagnosis not present

## 2018-05-21 DIAGNOSIS — G40419 Other generalized epilepsy and epileptic syndromes, intractable, without status epilepticus: Secondary | ICD-10-CM | POA: Diagnosis not present

## 2018-05-21 DIAGNOSIS — F10231 Alcohol dependence with withdrawal delirium: Secondary | ICD-10-CM

## 2018-05-21 DIAGNOSIS — G9340 Encephalopathy, unspecified: Secondary | ICD-10-CM | POA: Insufficient documentation

## 2018-05-21 LAB — AMMONIA: Ammonia: 35 umol/L (ref 9–35)

## 2018-05-21 LAB — CBC WITH DIFFERENTIAL/PLATELET
Abs Immature Granulocytes: 0 10*3/uL (ref 0.0–0.1)
Basophils Absolute: 0.1 10*3/uL (ref 0.0–0.1)
Basophils Relative: 0 %
EOS PCT: 0 %
Eosinophils Absolute: 0 10*3/uL (ref 0.0–0.7)
HEMATOCRIT: 45.5 % (ref 36.0–46.0)
HEMOGLOBIN: 14.1 g/dL (ref 12.0–15.0)
Immature Granulocytes: 0 %
LYMPHS ABS: 2.9 10*3/uL (ref 0.7–4.0)
LYMPHS PCT: 23 %
MCH: 29.6 pg (ref 26.0–34.0)
MCHC: 31 g/dL (ref 30.0–36.0)
MCV: 95.6 fL (ref 78.0–100.0)
MONO ABS: 0.9 10*3/uL (ref 0.1–1.0)
MONOS PCT: 7 %
Neutro Abs: 8.7 10*3/uL — ABNORMAL HIGH (ref 1.7–7.7)
Neutrophils Relative %: 70 %
Platelets: 409 10*3/uL — ABNORMAL HIGH (ref 150–400)
RBC: 4.76 MIL/uL (ref 3.87–5.11)
RDW: 13.8 % (ref 11.5–15.5)
WBC: 12.7 10*3/uL — AB (ref 4.0–10.5)

## 2018-05-21 LAB — URINALYSIS, ROUTINE W REFLEX MICROSCOPIC
Bilirubin Urine: NEGATIVE
GLUCOSE, UA: NEGATIVE mg/dL
Hgb urine dipstick: NEGATIVE
Ketones, ur: NEGATIVE mg/dL
Leukocytes, UA: NEGATIVE
Nitrite: NEGATIVE
PROTEIN: NEGATIVE mg/dL
Specific Gravity, Urine: 1.011 (ref 1.005–1.030)
pH: 6 (ref 5.0–8.0)

## 2018-05-21 LAB — COMPREHENSIVE METABOLIC PANEL
ALBUMIN: 4 g/dL (ref 3.5–5.0)
ALK PHOS: 92 U/L (ref 38–126)
ALT: 13 U/L (ref 0–44)
ANION GAP: 16 — AB (ref 5–15)
AST: 27 U/L (ref 15–41)
BILIRUBIN TOTAL: 1 mg/dL (ref 0.3–1.2)
BUN: 10 mg/dL (ref 8–23)
CALCIUM: 9.4 mg/dL (ref 8.9–10.3)
CO2: 21 mmol/L — ABNORMAL LOW (ref 22–32)
Chloride: 104 mmol/L (ref 98–111)
Creatinine, Ser: 0.97 mg/dL (ref 0.44–1.00)
GFR calc Af Amer: >60 mL/min (ref >60)
GFR calc non Af Amer: >60 mL/min (ref >60)
GLUCOSE: 122 mg/dL — AB (ref 70–99)
Potassium: 4.3 mmol/L (ref 3.5–5.1)
Sodium: 141 mmol/L (ref 135–145)
TOTAL PROTEIN: 7.4 g/dL (ref 6.5–8.1)

## 2018-05-21 LAB — ETHANOL: Alcohol, Ethyl (B): <10 mg/dL (ref <10)

## 2018-05-21 LAB — RAPID URINE DRUG SCREEN, HOSP PERFORMED
Amphetamines: NOT DETECTED
BARBITURATES: NOT DETECTED
Benzodiazepines: NOT DETECTED
Cocaine: NOT DETECTED
Opiates: NOT DETECTED
Tetrahydrocannabinol: NOT DETECTED

## 2018-05-21 LAB — VALPROIC ACID LEVEL: Valproic Acid Lvl: <10 ug/mL — ABNORMAL LOW (ref 50.0–100.0)

## 2018-05-21 LAB — CBG MONITORING, ED: Glucose-Capillary: 113 mg/dL — ABNORMAL HIGH (ref 70–99)

## 2018-05-21 MED ORDER — PANTOPRAZOLE SODIUM 40 MG PO TBEC
40.0000 mg | DELAYED_RELEASE_TABLET | Freq: Two times a day (BID) | ORAL | Status: DC
  Administered 2018-05-22: 40 mg via ORAL
  Filled 2018-05-21: qty 1

## 2018-05-21 MED ORDER — ONDANSETRON HCL 4 MG/2ML IJ SOLN: 4.0000 mg | Freq: Four times a day (QID) | INTRAMUSCULAR | Status: DC | PRN

## 2018-05-21 MED ORDER — LORAZEPAM 2 MG/ML IJ SOLN: 1.0000 mg | Freq: Four times a day (QID) | INTRAMUSCULAR | Status: DC | PRN

## 2018-05-21 MED ORDER — CALCIUM CARBONATE-VITAMIN D 500-200 MG-UNIT PO TABS
1.0000 | ORAL_TABLET | Freq: Two times a day (BID) | ORAL | Status: DC
  Administered 2018-05-22: 1 via ORAL
  Filled 2018-05-21: qty 1

## 2018-05-21 MED ORDER — THIAMINE HCL 100 MG/ML IJ SOLN: 100.0000 mg | Freq: Every day | INTRAMUSCULAR | Status: DC

## 2018-05-21 MED ORDER — THIAMINE HCL 100 MG PO TABS: 100.0000 mg | ORAL_TABLET | Freq: Every day | ORAL | Status: DC

## 2018-05-21 MED ORDER — VITAMIN D 1000 UNITS PO TABS
1000.0000 [IU] | ORAL_TABLET | Freq: Every day | ORAL | Status: DC
  Administered 2018-05-22: 1000 [IU] via ORAL
  Filled 2018-05-21: qty 1

## 2018-05-21 MED ORDER — LORAZEPAM 1 MG PO TABS: 1.0000 mg | ORAL_TABLET | Freq: Three times a day (TID) | ORAL | Status: DC | PRN

## 2018-05-21 MED ORDER — ACETAMINOPHEN 325 MG PO TABS: 650.0000 mg | ORAL_TABLET | Freq: Four times a day (QID) | ORAL | Status: DC | PRN

## 2018-05-21 MED ORDER — LORAZEPAM 2 MG/ML IJ SOLN
2.0000 mg | Freq: Once | INTRAMUSCULAR | Status: AC
  Administered 2018-05-21: 2 mg via INTRAMUSCULAR
  Filled 2018-05-21: qty 1

## 2018-05-21 MED ORDER — LEVETIRACETAM IN NACL 1000 MG/100ML IV SOLN
1000.0000 mg | Freq: Once | INTRAVENOUS | Status: AC
  Administered 2018-05-21: 1000 mg via INTRAVENOUS
  Filled 2018-05-21: qty 100

## 2018-05-21 MED ORDER — FOLIC ACID 1 MG PO TABS: 1.0000 mg | ORAL_TABLET | Freq: Every day | ORAL | Status: DC

## 2018-05-21 MED ORDER — ONE-DAILY MULTI VITAMINS PO TABS: 1.0000 | ORAL_TABLET | Freq: Every day | ORAL | Status: DC

## 2018-05-21 MED ORDER — SODIUM CHLORIDE 0.9 % IV BOLUS
1000.0000 mL | Freq: Once | INTRAVENOUS | Status: AC
  Administered 2018-05-21: 1000 mL via INTRAVENOUS

## 2018-05-21 MED ORDER — DIVALPROEX SODIUM 250 MG PO DR TAB: 500.0000 mg | DELAYED_RELEASE_TABLET | Freq: Two times a day (BID) | ORAL | Status: DC

## 2018-05-21 MED ORDER — ALBUTEROL SULFATE HFA 108 (90 BASE) MCG/ACT IN AERS: 2.0000 | INHALATION_SPRAY | Freq: Four times a day (QID) | RESPIRATORY_TRACT | Status: DC | PRN

## 2018-05-21 MED ORDER — LORAZEPAM 1 MG PO TABS: 1.0000 mg | ORAL_TABLET | Freq: Four times a day (QID) | ORAL | Status: DC | PRN

## 2018-05-21 MED ORDER — AMLODIPINE BESYLATE 5 MG PO TABS
10.0000 mg | ORAL_TABLET | Freq: Every day | ORAL | Status: DC
  Administered 2018-05-22: 10 mg via ORAL
  Filled 2018-05-21: qty 2

## 2018-05-21 MED ORDER — LEVETIRACETAM 750 MG PO TABS
750.0000 mg | ORAL_TABLET | Freq: Two times a day (BID) | ORAL | Status: DC
  Administered 2018-05-22: 750 mg via ORAL
  Filled 2018-05-21: qty 1

## 2018-05-21 MED ORDER — ADULT MULTIVITAMIN W/MINERALS CH
1.0000 | ORAL_TABLET | Freq: Every day | ORAL | Status: DC
  Administered 2018-05-22: 1 via ORAL
  Filled 2018-05-21: qty 1

## 2018-05-21 MED ORDER — ALBUTEROL SULFATE (2.5 MG/3ML) 0.083% IN NEBU: 2.5000 mg | INHALATION_SOLUTION | Freq: Four times a day (QID) | RESPIRATORY_TRACT | Status: DC | PRN

## 2018-05-21 MED ORDER — FOLIC ACID 1 MG PO TABS
1.0000 mg | ORAL_TABLET | Freq: Every day | ORAL | Status: DC
  Administered 2018-05-22: 1 mg via ORAL
  Filled 2018-05-21: qty 1

## 2018-05-21 MED ORDER — ENOXAPARIN SODIUM 40 MG/0.4ML ~~LOC~~ SOLN
40.0000 mg | SUBCUTANEOUS | Status: DC
  Administered 2018-05-22: 40 mg via SUBCUTANEOUS
  Filled 2018-05-21: qty 0.4

## 2018-05-21 MED ORDER — ONDANSETRON HCL 4 MG PO TABS: 4.0000 mg | ORAL_TABLET | Freq: Four times a day (QID) | ORAL | Status: DC | PRN

## 2018-05-21 MED ORDER — DICLOFENAC SODIUM 1 % TD GEL
2.0000 g | Freq: Every day | TRANSDERMAL | Status: DC | PRN
  Filled 2018-05-21: qty 100

## 2018-05-21 MED ORDER — DIVALPROEX SODIUM 250 MG PO DR TAB
500.0000 mg | DELAYED_RELEASE_TABLET | Freq: Two times a day (BID) | ORAL | Status: DC
  Administered 2018-05-22: 500 mg via ORAL
  Filled 2018-05-21: qty 2

## 2018-05-21 MED ORDER — MAGNESIUM OXIDE 400 (241.3 MG) MG PO TABS
400.0000 mg | ORAL_TABLET | Freq: Three times a day (TID) | ORAL | Status: DC
  Administered 2018-05-22: 400 mg via ORAL
  Filled 2018-05-21 (×2): qty 1

## 2018-05-21 MED ORDER — VITAMIN B-1 100 MG PO TABS
100.0000 mg | ORAL_TABLET | Freq: Every day | ORAL | Status: DC
  Administered 2018-05-22: 100 mg via ORAL
  Filled 2018-05-21: qty 1

## 2018-05-21 MED ORDER — ACETAMINOPHEN 500 MG PO TABS: 500.0000 mg | ORAL_TABLET | Freq: Three times a day (TID) | ORAL | Status: DC | PRN

## 2018-05-21 MED ORDER — ACETAMINOPHEN 650 MG RE SUPP: 650.0000 mg | Freq: Four times a day (QID) | RECTAL | Status: DC | PRN

## 2018-05-21 NOTE — H&P (Signed)
History and Physical    Lindsey [name] DZH:[id number] DOB: 1956/07/06 DOA: 05/21/2018  PCP: [name], MD  Patient coming from: Home  I have personally briefly reviewed patient's old medical records in [place]  Chief Complaint: Seizure  HPI: Lindsey [name] is a 62 y.o. female with medical history significant of EtOH abuse, seizure disorder, non-compliance with seizure meds.  Patient had seizure at home apparently per EMS report.  Post ictal and combative when EMS arrived and brought in to ED.   ED Course: In the ED initially alert but disoriented.  Then had a witnessed seizure which was lysed with 2mg  ativan IM.  She was also loaded with 1gm keppra.  CT head neg.  She is now sleepy and post-ictal.   Review of Systems: Unable to perform due to AMS.  Past Medical History:  Diagnosis Date  . Asthma   . H/O ETOH abuse   . Seizure [facility])     Past Surgical History:  Procedure Laterality Date  . ABDOMINAL HYSTERECTOMY    . GALLBLADDER SURGERY       reports that she has been smoking cigarettes. She has never used smokeless tobacco. She reports that she has current or past drug history. Drug: Marijuana. She reports that she does not drink alcohol.  Allergies  Allergen Reactions  . Kiwi Extract Hives and Itching  . Morphine And Related Hives  . Penicillins Hives and Other (See Comments)    Has patient had a PCN reaction causing immediate rash, facial/tongue/throat swelling, SOB or lightheadedness with hypotension: Yes Has patient had a PCN reaction causing severe rash involving mucus membranes or skin necrosis: No Has patient had a PCN reaction that required hospitalization No Has patient had a PCN reaction occurring within the last 10 years: No If all of the above answers are "NO", then may proceed with Cephalosporin use.  . Strawberry Extract Hives and Itching    Family History  Problem Relation Age of Onset  . Seizures Mother   . Alcohol abuse  Mother   . Heart attack Father   . Drug abuse Brother   . Cancer Brother        Pancreatic     Prior to Admission medications   Medication Sig Start Date End Date Taking? Authorizing Provider  acetaminophen (TYLENOL) 500 MG tablet Take 500 mg by mouth every 8 (eight) hours as needed.    [provider]  albuterol (PROAIR HFA) 108 (90 Base) MCG/ACT inhaler Inhale 2 puffs into the lungs every 6 (six) hours as needed for wheezing or shortness of breath.    [provider]  albuterol (PROVENTIL) (2.5 MG/3ML) 0.083% nebulizer solution Take 2.5 mg by nebulization every 6 (six) hours as needed for wheezing or shortness of breath.    [provider]  amLODipine (NORVASC) 10 MG tablet Take 1 tablet (10 mg total) by mouth daily. 03/30/18   [name], MD  calcium-vitamin D 250-100 MG-UNIT tablet Take 1 tablet by mouth 2 (two) times daily. Patient not taking: Reported on 03/26/2018 01/29/18   [name], John, MD  cholecalciferol (VITAMIN D) 1000 units tablet Take 1,000 Units by mouth daily.    [provider]  diclofenac sodium (VOLTAREN) 1 % GEL Apply 2 g topically daily as needed (arthritis pain).    [provider]  divalproex (DEPAKOTE) 500 MG DR tablet Take 1 tablet (500 mg total) by mouth 2 (two) times daily. 01/29/18   [name], Jenny Reichmann, MD  folic acid (FOLVITE) 1  MG tablet Take 1 mg by mouth daily.    [provider]  levETIRAcetam (KEPPRA) 750 MG tablet Take 1 tablet (750 mg total) by mouth 2 (two) times daily. 01/29/18   [name], John, MD  LORazepam (ATIVAN) 1 MG tablet Take 1 mg by mouth every 8 (eight) hours as needed for anxiety.    [provider]  magnesium oxide (MAG-OX) 400 MG tablet Take 400 mg by mouth 3 (three) times daily.    [provider]  Multiple Vitamin (MULTIVITAMIN) tablet Take 1 tablet by mouth daily.    [provider]  pantoprazole (PROTONIX) 40 MG tablet Take 1 tablet (40 mg total) by mouth 2 (two)  times daily. 03/30/18 03/30/19  [name], MD  thiamine 100 MG tablet Take 1 tablet (100 mg total) by mouth daily. 05/03/17   Aline August, MD    Physical Exam: Vitals:   05/21/18 1915 05/21/18 2015  BP: (!) 187/90 (!) 173/82  Pulse: (!) 117 100  Resp: (!) 26 (!) 23  SpO2: 100% 100%    Constitutional: Post ictal, lethargic Eyes: PERRL, lids and conjunctivae normal ENMT: Mucous membranes are moist. Posterior pharynx clear of any exudate or lesions.Normal dentition.  Neck: normal, supple, no masses, no thyromegaly Respiratory: clear to auscultation bilaterally, no wheezing, no crackles. Normal respiratory effort. No accessory muscle use.  Cardiovascular: Regular rate and rhythm, no murmurs / rubs / gallops. No extremity edema. 2+ pedal pulses. No carotid bruits.  Abdomen: no tenderness, no masses palpated. No hepatosplenomegaly. Bowel sounds positive.  Musculoskeletal: no clubbing / cyanosis. No joint deformity upper and lower extremities. Good ROM, no contractures. Normal muscle tone.  Skin: no rashes, lesions, ulcers. No induration Neurologic: MAE, responds to noxious stimuli, but very sleepy. Psychiatric: Lethargic and post-ictal   Labs on Admission: I have personally reviewed following labs and imaging studies  CBC: Recent Labs  Lab 05/21/18 1913  WBC 12.7*  NEUTROABS 8.7*  HGB 14.1  HCT 45.5  MCV 95.6  PLT 151*   Basic Metabolic Panel: Recent Labs  Lab 05/21/18 1913  NA 141  K 4.3  CL 104  CO2 21*  GLUCOSE 122*  BUN 10  CREATININE 0.97  CALCIUM 9.4   GFR: CrCl cannot be calculated (Unknown ideal weight.). Liver Function Tests: Recent Labs  Lab 05/21/18 1913  AST 27  ALT 13  ALKPHOS 92  BILITOT 1.0  PROT 7.4  ALBUMIN 4.0   No results for input(s): LIPASE, AMYLASE in the last 168 hours. Recent Labs  Lab 05/21/18 1913  AMMONIA 35   Coagulation Profile: No results for input(s): INR, PROTIME in the last 168 hours. Cardiac Enzymes: No  results for input(s): CKTOTAL, CKMB, CKMBINDEX, TROPONINI in the last 168 hours. BNP (last 3 results) No results for input(s): PROBNP in the last 8760 hours. HbA1C: No results for input(s): HGBA1C in the last 72 hours. CBG: Recent Labs  Lab 05/21/18 1909  GLUCAP 113*   Lipid Profile: No results for input(s): CHOL, HDL, LDLCALC, TRIG, CHOLHDL, LDLDIRECT in the last 72 hours. Thyroid Function Tests: No results for input(s): TSH, T4TOTAL, FREET4, T3FREE, THYROIDAB in the last 72 hours. Anemia Panel: No results for input(s): VITAMINB12, FOLATE, FERRITIN, TIBC, IRON, RETICCTPCT in the last 72 hours. Urine analysis:    Component Value Date/Time   COLORURINE YELLOW 03/25/2018 2338   APPEARANCEUR HAZY (A) 03/25/2018 2338   APPEARANCEUR Clear 01/28/2014 1937   LABSPEC 1.020 03/25/2018 2338   LABSPEC 1.015 01/28/2014 1937  PHURINE 6.0 03/25/2018 2338   GLUCOSEU NEGATIVE 03/25/2018 2338   GLUCOSEU Negative 01/28/2014 1937   HGBUR MODERATE (A) 03/25/2018 2338   BILIRUBINUR NEGATIVE 03/25/2018 2338   BILIRUBINUR Negative 01/28/2014 1937   KETONESUR 20 (A) 03/25/2018 2338   PROTEINUR 30 (A) 03/25/2018 2338   UROBILINOGEN 0.2 03/01/2015 1935   NITRITE NEGATIVE 03/25/2018 2338   LEUKOCYTESUR LARGE (A) 03/25/2018 2338   LEUKOCYTESUR Negative 01/28/2014 1937    Radiological Exams on Admission: Ct Head Wo Contrast  Result Date: 05/21/2018 CLINICAL DATA:  Encephalopathy, headache. EXAM: CT HEAD WITHOUT CONTRAST TECHNIQUE: Contiguous axial images were obtained from the base of the skull through the vertex without intravenous contrast. COMPARISON:  03/26/2018 FINDINGS: Brain: Mild cerebral atrophy/volume loss and chronic small vessel disease. No acute intracranial abnormality. Specifically, no hemorrhage, hydrocephalus, mass lesion, acute infarction, or significant intracranial injury. Vascular: No hyperdense vessel or unexpected calcification. Skull: No acute calvarial abnormality.  Sinuses/Orbits: Visualized paranasal sinuses and mastoids clear. Orbital soft tissues unremarkable. Other: None IMPRESSION: No acute intracranial abnormality. Atrophy, chronic microvascular disease. Electronically Signed   By: Rolm Baptise M.D.   On: 05/21/2018 20:20    EKG: Independently reviewed.  Assessment/Plan Principal Problem:   Post-ictal state [facility]) Active Problems:   Alcohol dependence with withdrawal with complication (HCC)   Acute encephalopathy   Seizure secondary to subtherapeutic anticonvulsant medication ([place])   Alcohol withdrawal seizure ([place])   Patient's noncompliance with other medical treatment and regimen    1. Seizures due to non-compliance with meds - 1. Keppra 1gm load done in ED, also got 2mg  ativan IM. 2. Resume home PO meds including Depakote, keppra. 3. Seizure precautions 4. Tele monitor 2. EtOH withdrawal also contributing to seizures - 1. CIWA 2. Tele monitor  DVT prophylaxis: Lovenox Code Status: Full Family Communication: No family in room Disposition Plan: Home after admit Consults called: None Admission status: Place in obs - convert to IP if not discharged tomorrow   [place], [place] Hospitalists Pager [phone] Only works nights!  If 7AM-7PM, please contact the primary day team physician taking care of patient  [email or web address] Password [facility]  05/21/2018, 9:07 PM

## 2018-05-21 NOTE — ED Triage Notes (Signed)
Patient arrived via EMS from home. Family reports that she had a seizure, they told EMS that she has seizures after "drinking" Patient is not following commands, she responds to pain, she is combative with staff

## 2018-05-21 NOTE — ED Notes (Signed)
Attempted to call report; per secretary nurse in patient's room and will call me back.

## 2018-05-21 NOTE — ED Provider Notes (Signed)
[place] EMERGENCY DEPARTMENT Provider Note   CSN: [id number] Arrival date & time: 05/21/18  1830     History   Chief Complaint Chief Complaint  Patient presents with  . Seizures    HPI Lindsey [name] is a 62 y.o. female.  HPI Patient with history of alcohol abuse and seizures, brought in by EMS with complaint of possible postictal stage.  She was brought in because she was combative.  History of the same after drinking binges.  Patient has had a history of noncompliance with taking her Depakote and her other medications.  It Past Medical History:  Diagnosis Date  . Asthma   . H/O ETOH abuse   . Seizure [facility])     Patient Active Problem List   Diagnosis Date Noted  . Anemia   . Alcohol withdrawal delirium ([place]) 03/26/2018  . Seizures ([place]) 06/26/2016  . Patient's noncompliance with other medical treatment and regimen 06/26/2016  . Leukocytosis 06/26/2016  . Tobacco abuse 06/26/2016  . Alcohol withdrawal seizure ([place]) 06/25/2016  . Psychosis ([place]) 11/04/2015  . Essential hypertension 11/03/2015  . 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 Asthma 10/10/2015  . Seizure disorder ([place]) 08/15/2015  . Status epilepticus ([place]) 08/15/2015  . Post-ictal state ([place]) 03/01/2015  . Seizure secondary to subtherapeutic anticonvulsant medication ([place]) 02/06/2015  . Recurrent seizures ([place]) 02/06/2015  . Acute encephalopathy   . Polysubstance abuse ([place])   . Alcohol dependence with withdrawal with complication ([place]) 69/67/8938  . Protein-calorie malnutrition, severe ([place]) 03/04/2014  . Encephalopathy 03/04/2014  . Alcohol abuse 11/06/2013  . Alcohol withdrawal ([place]) 11/06/2013  . Marijuana abuse 11/06/2013  . Seizure ([place]) 11/05/2013  . Altered mental status 11/05/2013    Past Surgical History:  Procedure Laterality Date  . ABDOMINAL HYSTERECTOMY    . GALLBLADDER SURGERY       OB History   None      Home Medications    Prior to  Admission medications   Medication Sig Start Date End Date Taking? Authorizing Provider  acetaminophen (TYLENOL) 500 MG tablet Take 500 mg by mouth every 8 (eight) hours as needed.    [provider]  albuterol (PROAIR HFA) 108 (90 Base) MCG/ACT inhaler Inhale 2 puffs into the lungs every 6 (six) hours as needed for wheezing or shortness of breath.    [provider]  albuterol (PROVENTIL) (2.5 MG/3ML) 0.083% nebulizer solution Take 2.5 mg by nebulization every 6 (six) hours as needed for wheezing or shortness of breath.    [provider]  amLODipine (NORVASC) 10 MG tablet Take 1 tablet (10 mg total) by mouth daily. 03/30/18   [name], MD  calcium-vitamin D 250-100 MG-UNIT tablet Take 1 tablet by mouth 2 (two) times daily. Patient not taking: Reported on 03/26/2018 01/29/18   [name], John, MD  cholecalciferol (VITAMIN D) 1000 units tablet Take 1,000 Units by mouth daily.    [provider]  diclofenac sodium (VOLTAREN) 1 % GEL Apply 2 g topically daily as needed (arthritis pain).    [provider]  divalproex (DEPAKOTE) 500 MG DR tablet Take 1 tablet (500 mg total) by mouth 2 (two) times daily. 01/29/18   [name], John, MD  folic acid (FOLVITE) 1 MG tablet Take 1 mg by mouth daily.    [provider]  levETIRAcetam (KEPPRA) 750 MG tablet Take 1 tablet (750 mg total) by mouth 2 (two) times daily. 01/29/18   [name], Jenny Reichmann, MD  LORazepam (ATIVAN) 1 MG  tablet Take 1 mg by mouth every 8 (eight) hours as needed for anxiety.    [provider]  magnesium oxide (MAG-OX) 400 MG tablet Take 400 mg by mouth 3 (three) times daily.    [provider]  Multiple Vitamin (MULTIVITAMIN) tablet Take 1 tablet by mouth daily.    [provider]  pantoprazole (PROTONIX) 40 MG tablet Take 1 tablet (40 mg total) by mouth 2 (two) times daily. 08/19/15   Reyne Dumas, MD  pantoprazole (PROTONIX) 40 MG tablet Take 1 tablet (40 mg total) by  mouth 2 (two) times daily. 03/30/18 03/30/19  [name], MD  thiamine 100 MG tablet Take 1 tablet (100 mg total) by mouth daily. 05/03/17   Aline August, MD    Family History Family History  Problem Relation Age of Onset  . Seizures Mother   . Alcohol abuse Mother   . Heart attack Father   . Drug abuse Brother   . Cancer Brother        Pancreatic    Social History Social History   Tobacco Use  . Smoking status: Current Some Day Smoker    Types: Cigarettes  . Smokeless tobacco: Never Used  Substance Use Topics  . Alcohol use: No    Comment: Hx of alchol abuse, not currently drinking  . Drug use: Yes    Types: Marijuana    Comment: "every now and then"     Allergies   Kiwi extract; Morphine and related; Penicillins; and Strawberry extract   Review of Systems Review of Systems  Unable to perform ROS: Mental status change  Constitutional: Negative for diaphoresis and fever.  Neurological: Positive for seizures and headaches.  All other systems reviewed and are negative.    Physical Exam Updated Vital Signs BP (!) 187/90   Pulse (!) 117   Resp (!) 26   SpO2 100%   Physical Exam  Constitutional: She appears well-developed and well-nourished.  Holding her head  HENT:  Head: Normocephalic.  Eyes: Pupils are equal, round, and reactive to light. Conjunctivae and EOM are normal.  Neck: Neck supple.  Cardiovascular: Normal rate, regular rhythm and normal heart sounds.  Pulmonary/Chest: Effort normal and breath sounds normal. No respiratory distress. She has no wheezes. She has no rales.  Abdominal: Soft. Bowel sounds are normal. She exhibits no distension. There is no tenderness. There is no rebound.  Musculoskeletal: She exhibits no edema.  Neurological: She is alert.  Unable to recall place, date, name.  Moving all 4 extremities.  Speech is normal.  Skin: Skin is warm and dry.  Psychiatric:  Patient is irritated and combative.  Keeps yelling to stop  touching her every time attempt to examine her.  Nursing note and vitals reviewed.    ED Treatments / Results  Labs (all labs ordered are listed, but only abnormal results are displayed) Labs Reviewed  CBC WITH DIFFERENTIAL/PLATELET - Abnormal; Notable for the following components:      Result Value   WBC 12.7 (*)    Platelets 409 (*)    Neutro Abs 8.7 (*)    All other components within normal limits  COMPREHENSIVE METABOLIC PANEL - Abnormal; Notable for the following components:   CO2 21 (*)    Glucose, Bld 122 (*)    Anion gap 16 (*)    All other components within normal limits  VALPROIC ACID LEVEL - Abnormal; Notable for the following components:   Valproic Acid Lvl <10 (*)  All other components within normal limits  CBG MONITORING, ED - Abnormal; Notable for the following components:   Glucose-Capillary 113 (*)    All other components within normal limits  ETHANOL  AMMONIA  URINALYSIS, ROUTINE W REFLEX MICROSCOPIC  RAPID URINE DRUG SCREEN, HOSP PERFORMED    EKG EKG Interpretation  Date/Time:  Sunday May 21 2018 18:44:30 EDT Ventricular Rate:  103 PR Interval:    QRS Duration: 109 QT Interval:  344 QTC Calculation: 455 R Axis:   45 Text Interpretation:  Sinus tachycardia Baseline wander Non-specific ST-t changes Confirmed by Lajean Saver [phone]) on 05/21/2018 6:53:20 PM   Radiology Ct Head Wo Contrast  Result Date: 05/21/2018 CLINICAL DATA:  Encephalopathy, headache. EXAM: CT HEAD WITHOUT CONTRAST TECHNIQUE: Contiguous axial images were obtained from the base of the skull through the vertex without intravenous contrast. COMPARISON:  03/26/2018 FINDINGS: Brain: Mild cerebral atrophy/volume loss and chronic small vessel disease. No acute intracranial abnormality. Specifically, no hemorrhage, hydrocephalus, mass lesion, acute infarction, or significant intracranial injury. Vascular: No hyperdense vessel or unexpected calcification. Skull: No acute calvarial  abnormality. Sinuses/Orbits: Visualized paranasal sinuses and mastoids clear. Orbital soft tissues unremarkable. Other: None IMPRESSION: No acute intracranial abnormality. Atrophy, chronic microvascular disease. Electronically Signed   By: Rolm Baptise M.D.   On: 05/21/2018 20:20    Procedures Procedures (including critical care time)  Medications Ordered in ED Medications  sodium chloride 0.9 % bolus 1,000 mL (has no administration in time range)  levETIRAcetam (KEPPRA) IVPB 1000 mg/100 mL premix (has no administration in time range)  LORazepam (ATIVAN) injection 2 mg (2 mg Intramuscular Given 05/21/18 1912)     Initial Impression / Assessment and Plan / ED Course  I have reviewed the triage vital signs and the nursing notes.  Pertinent labs & imaging results that were available during my care of the patient were reviewed by me and considered in my medical decision making (see chart for details).     Patient with history of seizures as well as alcohol abuse, has had multiple admissions here for the same  Patient is alert, however disoriented, she is angry and refuses for [place] to perform any exam or start an IV.  She is moving all 4 extremities.  She is complaining of severe headache.  She is somewhat combative.  I will give her Ativan, draw some labs, check her Depakote levels.  History of noncompliance.  7:22 PM  I was called into the room because patient had a seizure witnessed by an Therapist, sports.   2 mg of Ativan given IM, she has not gotten an IV yet. Patient stopped seizing, postictal.  Seizure lasted probably approximately 20 to 30 seconds.  Will load with Keppra, will give IV fluids and check labs.  CBG is 113.  No evidence of head trauma however will get CT head since she was complaining of severe headache prior to the seizure.  8:49 PM CT had negative.  Labs show elevated white blood cell count 12.7, elevated platelets of 4 9, bicarb of 21, otherwise unremarkable labs.  Patient is responsive  to painful stimuli, however otherwise postictal.  We will continue to monitor, IV fluids, loaded with Keppra, will monitor.  Will admit for further treatment.  Vitals:   05/21/18 2045 05/21/18 2100 05/21/18 2115 05/21/18 2130  BP: (!) 167/83 (!) 143/126 (!) 158/74 (!) 162/78  Pulse: 96 97 97 95  Resp: 18 19 20 16   SpO2: 100% 100% 100% 100%  Final Clinical Impressions(s) / ED Diagnoses   Final diagnoses:  Status epilepticus [facility])    ED Discharge Orders    None      Janee Morn 05/21/18 2156  Lajean Saver, MD 05/21/18 2258

## 2018-05-22 DIAGNOSIS — Z79899 Other long term (current) drug therapy: Secondary | ICD-10-CM | POA: Diagnosis not present

## 2018-05-22 DIAGNOSIS — Z9119 Patient's noncompliance with other medical treatment and regimen: Secondary | ICD-10-CM | POA: Diagnosis not present

## 2018-05-22 DIAGNOSIS — G9340 Encephalopathy, unspecified: Secondary | ICD-10-CM

## 2018-05-22 DIAGNOSIS — R569 Unspecified convulsions: Secondary | ICD-10-CM

## 2018-05-22 MED ORDER — DIVALPROEX SODIUM 500 MG PO DR TAB: 500.0000 mg | DELAYED_RELEASE_TABLET | Freq: Two times a day (BID) | ORAL | 0 refills | Status: DC

## 2018-05-22 MED ORDER — LEVETIRACETAM 750 MG PO TABS: 750.0000 mg | ORAL_TABLET | Freq: Two times a day (BID) | ORAL | 0 refills | Status: DC

## 2018-05-22 NOTE — Plan of Care (Signed)
Pt has been sleeping since arrival to shift, agitated with verbal/tactile stimulation. No s/s w/d at this time, no additional medication needed. SR on monitor, afebrile at this time.

## 2018-05-22 NOTE — Care Management Note (Signed)
Case Management Note  Patient Details  Name: Lindsey [name] MRN: [id number] Date of Birth: 13-Apr-1956  Subjective/Objective:    Pt in with seizures from non compliance with her meds. She lives with her daughter: [name]: [phone]                Action/Plan: CM consulted for med noncompliance. CM met with the patient and she has some confusion. CM called pts daughter: [name], and she states there were transportation issues but those are resolved and her Medicaid Transportation has been switches to this area.  CM inquired with [name] about pt not taking her meds and she states the patient has not been to her PCP lately so her prescriptions ran out. She does have an appointment with her PCP on Sept 2nd. CM reinforced the importance of keeping this appointment with [facility].  CM inquired about a [place] RN to go over and check medications in the home and the daughter was in agreement. Choice provided and Advanced selected. Butch Penny with [facility] notified and accepted the referral. Daughter denies ability to pick up mother today. CM inquired about a cab and [name] felt this was ok as long a the cab driver had the address. CM went over the address in Epic with [facility] and she verified it was correct.  CSW updated on the need for a cab voucher for transport.  Expected Discharge Date:  05/22/18               Expected Discharge Plan:  [place]  In-House Referral:     Discharge planning Services  CM Consult  Post Acute Care Choice:  Home Health Choice offered to:  Adult Children  DME Arranged:    DME Agency:     HH Arranged:  RN [place] Agency:  [place]  Status of Service:  Completed, signed off  If discussed at [place] of Stay Meetings, dates discussed:    Additional Comments:  Pollie Friar, RN 05/22/2018, 3:36 PM

## 2018-05-22 NOTE — Progress Notes (Signed)
CSW alerted by team that patient needs cab voucher home. CSW provided voucher.  CSW signing off.  Laveda Abbe, [place] Clinical Social Worker [phone]

## 2018-05-22 NOTE — Progress Notes (Signed)
Pt discharge.  Cab called. VSS

## 2018-05-22 NOTE — Discharge Summary (Addendum)
Physician Discharge Summary  [name] VWU:[id number] DOB: 11-Dec-1955 DOA: 05/21/2018  PCP: [name], MD  Admit date: 05/21/2018 Discharge date: 05/22/2018  Admitted From: Home Disposition: Home   Recommendations for Outpatient Follow-up:  1. Follow up with PCP in 1-2 weeks. Continue medication adherence counseling. 2. Alcohol cessation counseling  Home Health: RN Equipment/Devices: None new Discharge Condition: Stable CODE STATUS: Full Diet recommendation: As tolerated  Brief/Interim Summary: Lindsey [name] is a 62 y.o. female with a history of alcohol abuse and seizures as well as poor medical adherence and follow up who was brought to the ED by EMS after being called following seizure activity. She had been "partying too hard" the day before and has not been taking any seizure medications, developed seizure-like activity at home. EMS was called at which time there was concern for post-ictal state and combativeness which continued while in the ED. The patient had an additional witnessed seizure in the ED resolved with IM benzodiazepine (had not established IV access), also loaded with keppra. No ICH on CT head. Depakote level was undetectable. She was observed overnight into 8/26 without further seizure activity. No evidence of EtOH withdrawal. Pt reports having no rides to the pharmacy or doctor so takes no seizure medications. When she does take depakote she notes no untoward side effects. This was discussed at length with the patient who will fill the prescription on the way home and take medications as directed.  Discharge Diagnoses:  Principal Problem:   Post-ictal state [facility]) Active Problems:   Alcohol dependence with withdrawal with complication ([place])   Acute encephalopathy   Seizure secondary to subtherapeutic anticonvulsant medication ([place])   Alcohol withdrawal seizure ([place])   Patient's noncompliance with other medical treatment and regimen  Seizure due to  nonadherence to AED: Depakote level undetectable.  - s/p keppra load and benzo yesterday. Postictal state resolved, mentating clearly, no recurrent seizurelike activity. Restart home depakote and keppra (new prescriptions provided) - CSW and CM consulted, have discussed with patient that medicaid can provide transport.   EtOH abuse: No withdrawal at this time.  - Cessation counseling provided.  Discharge Instructions Discharge Instructions    Discharge instructions   Complete by:  As directed    You must take your seizure medications: Depakote and Keppra. New prescriptions were printed and provided to you at discharge. Fill these and start taking them today. Please also abstain from alcohol.   Follow up with your PCP in the next 1-2 weeks (medicaid can provide transportation for you), or seek medical attention sooner if your symptoms return.     Allergies as of 05/22/2018      Reactions   Kiwi Extract Hives, Itching   Morphine And Related Hives   Penicillins Hives, Other (See Comments)   Has patient had a PCN reaction causing immediate rash, facial/tongue/throat swelling, SOB or lightheadedness with hypotension: Yes Has patient had a PCN reaction causing severe rash involving mucus membranes or skin necrosis: No Has patient had a PCN reaction that required hospitalization No Has patient had a PCN reaction occurring within the last 10 years: No If all of the above answers are "NO", then may proceed with Cephalosporin use.   Strawberry Extract Hives, Itching      Medication List    STOP taking these medications   LORazepam 1 MG tablet Commonly known as:  ATIVAN     TAKE these medications   acetaminophen 500 MG tablet Commonly known as:  TYLENOL Take 500 mg by mouth every  8 (eight) hours as needed for mild pain.   amLODipine 10 MG tablet Commonly known as:  NORVASC Take 1 tablet (10 mg total) by mouth daily.   calcium-vitamin D 250-100 MG-UNIT tablet Take 1 tablet by mouth 2  (two) times daily.   cholecalciferol 1000 units tablet Commonly known as:  VITAMIN D Take 1,000 Units by mouth daily.   diclofenac sodium 1 % Gel Commonly known as:  VOLTAREN Apply 2 g topically daily as needed (arthritis pain).   divalproex 500 MG DR tablet Commonly known as:  DEPAKOTE Take 1 tablet (500 mg total) by mouth 2 (two) times daily.   folic acid 1 MG tablet Commonly known as:  FOLVITE Take 1 mg by mouth daily.   levETIRAcetam 750 MG tablet Commonly known as:  KEPPRA Take 1 tablet (750 mg total) by mouth 2 (two) times daily.   magnesium oxide 400 MG tablet Commonly known as:  MAG-OX Take 400 mg by mouth 3 (three) times daily.   multivitamin tablet Take 1 tablet by mouth daily.   nystatin cream Commonly known as:  MYCOSTATIN Apply 1 application topically 2 (two) times daily. Under breast area.   pantoprazole 40 MG tablet Commonly known as:  PROTONIX Take 1 tablet (40 mg total) by mouth 2 (two) times daily.   PROAIR HFA 108 (90 Base) MCG/ACT inhaler Generic drug:  albuterol Inhale 2 puffs into the lungs every 6 (six) hours as needed for wheezing or shortness of breath.   albuterol (2.5 MG/3ML) 0.083% nebulizer solution Commonly known as:  PROVENTIL Take 2.5 mg by nebulization every 6 (six) hours as needed for wheezing or shortness of breath.   thiamine 100 MG tablet Take 1 tablet (100 mg total) by mouth daily.      Follow-up Information    [name], MD. Schedule an appointment as soon as possible for a visit.   Specialty:  Family Medicine Contact information: [place] [address] [phone]          Allergies  Allergen Reactions  . Kiwi Extract Hives and Itching  . Morphine And Related Hives  . Penicillins Hives and Other (See Comments)    Has patient had a PCN reaction causing immediate rash, facial/tongue/throat swelling, SOB or lightheadedness with hypotension: Yes Has patient had a PCN reaction causing severe  rash involving mucus membranes or skin necrosis: No Has patient had a PCN reaction that required hospitalization No Has patient had a PCN reaction occurring within the last 10 years: No If all of the above answers are "NO", then may proceed with Cephalosporin use.  . Strawberry Extract Hives and Itching    Consultations:  None  Procedures/Studies: Ct Head Wo Contrast  Result Date: 05/21/2018 CLINICAL DATA:  Encephalopathy, headache. EXAM: CT HEAD WITHOUT CONTRAST TECHNIQUE: Contiguous axial images were obtained from the base of the skull through the vertex without intravenous contrast. COMPARISON:  03/26/2018 FINDINGS: Brain: Mild cerebral atrophy/volume loss and chronic small vessel disease. No acute intracranial abnormality. Specifically, no hemorrhage, hydrocephalus, mass lesion, acute infarction, or significant intracranial injury. Vascular: No hyperdense vessel or unexpected calcification. Skull: No acute calvarial abnormality. Sinuses/Orbits: Visualized paranasal sinuses and mastoids clear. Orbital soft tissues unremarkable. Other: None IMPRESSION: No acute intracranial abnormality. Atrophy, chronic microvascular disease. Electronically Signed   By: Rolm Baptise M.D.   On: 05/21/2018 20:20    Subjective: Feels well. No seizures overnight. No combativeness or somnolence.   Discharge Exam: Vitals:   05/22/18 0845 05/22/18 1151  BP:  140/78 130/90  Pulse: 95 (!) 106  Resp: 16 16  Temp: (!) 97.5 F (36.4 C) 98.9 F (37.2 C)  SpO2: 100% 100%   General: Pt is alert, awake, not in acute distress Cardiovascular: RRR, S1/S2 +, no rubs, no gallops Respiratory: CTA bilaterally, no wheezing, no rhonchi Abdominal: Soft, NT, ND, bowel sounds + Extremities: No edema, no cyanosis Neuro: Alert, oriented, no focal deficits. No tremor.  Labs: BNP (last 3 results) No results for input(s): BNP in the last 8760 hours. Basic Metabolic Panel: Recent Labs  Lab 05/21/18 1913  NA 141  K 4.3   CL 104  CO2 21*  GLUCOSE 122*  BUN 10  CREATININE 0.97  CALCIUM 9.4   Liver Function Tests: Recent Labs  Lab 05/21/18 1913  AST 27  ALT 13  ALKPHOS 92  BILITOT 1.0  PROT 7.4  ALBUMIN 4.0   No results for input(s): LIPASE, AMYLASE in the last 168 hours. Recent Labs  Lab 05/21/18 1913  AMMONIA 35   CBC: Recent Labs  Lab 05/21/18 1913  WBC 12.7*  NEUTROABS 8.7*  HGB 14.1  HCT 45.5  MCV 95.6  PLT 409*   Cardiac Enzymes: No results for input(s): CKTOTAL, CKMB, CKMBINDEX, TROPONINI in the last 168 hours. BNP: Invalid input(s): POCBNP CBG: Recent Labs  Lab 05/21/18 1909  GLUCAP 113*   D-Dimer No results for input(s): DDIMER in the last 72 hours. Hgb A1c No results for input(s): HGBA1C in the last 72 hours. Lipid Profile No results for input(s): CHOL, HDL, LDLCALC, TRIG, CHOLHDL, LDLDIRECT in the last 72 hours. Thyroid function studies No results for input(s): TSH, T4TOTAL, T3FREE, THYROIDAB in the last 72 hours.  Invalid input(s): FREET3 Anemia work up No results for input(s): VITAMINB12, FOLATE, FERRITIN, TIBC, IRON, RETICCTPCT in the last 72 hours. Urinalysis    Component Value Date/Time   COLORURINE YELLOW 05/21/2018 2154   APPEARANCEUR HAZY (A) 05/21/2018 2154   APPEARANCEUR Clear 01/28/2014 1937   LABSPEC 1.011 05/21/2018 2154   LABSPEC 1.015 01/28/2014 1937   PHURINE 6.0 05/21/2018 2154   GLUCOSEU NEGATIVE 05/21/2018 2154   GLUCOSEU Negative 01/28/2014 1937   HGBUR NEGATIVE 05/21/2018 2154   BILIRUBINUR NEGATIVE 05/21/2018 2154   BILIRUBINUR Negative 01/28/2014 [place] NEGATIVE 05/21/2018 2154   PROTEINUR NEGATIVE 05/21/2018 2154   UROBILINOGEN 0.2 03/01/2015 1935   NITRITE NEGATIVE 05/21/2018 2154   LEUKOCYTESUR NEGATIVE 05/21/2018 2154   LEUKOCYTESUR Negative 01/28/2014 1937    Time coordinating discharge: Approximately 40 minutes  Patrecia Pour, MD  [facility] 05/22/2018, 12:29 PM Pager [phone]

## 2018-06-06 ENCOUNTER — Ambulatory Visit: Payer: Self-pay | Admitting: Gastroenterology

## 2018-06-30 ENCOUNTER — Ambulatory Visit: Payer: Self-pay | Admitting: Gastroenterology

## 2018-07-06 ENCOUNTER — Telehealth: Payer: Self-pay | Admitting: Family Medicine

## 2018-07-06 NOTE — Telephone Encounter (Signed)
Copied from [place] [phone]. Topic: General - Other >> Jul 04, 2018  8:23 AM Yvette Rack wrote: Reason for CRM: patient calling stating that the hospital wanted her to come to the practice because os seizures I really didn't understand the reason she just said seizures and was advised to come to practice

## 2018-07-06 NOTE — Telephone Encounter (Signed)
Called and left a message on identified voicemail informing the pt that I have received her message.  Informed her that she will need to pick a primary care physician and will then need to call and schedule a new pt appointment.  Advised she could choose from our many locations and she could look on the Internet.  If she had any questions than she could call back.

## 2019-05-02 ENCOUNTER — Emergency Department (HOSPITAL_COMMUNITY)
Admission: EM | Admit: 2019-05-02 | Discharge: 2019-05-02 | Disposition: A | Discharge status: Home / Self Care | Payer: Medicaid Other | Attending: Emergency Medicine | Admitting: Emergency Medicine

## 2019-05-02 ENCOUNTER — Encounter (HOSPITAL_COMMUNITY): Payer: Self-pay | Admitting: Emergency Medicine

## 2019-05-02 ENCOUNTER — Emergency Department (HOSPITAL_COMMUNITY): Payer: Medicaid Other

## 2019-05-02 ENCOUNTER — Other Ambulatory Visit: Payer: Self-pay

## 2019-05-02 DIAGNOSIS — I10 Essential (primary) hypertension: Secondary | ICD-10-CM | POA: Diagnosis not present

## 2019-05-02 DIAGNOSIS — F17210 Nicotine dependence, cigarettes, uncomplicated: Secondary | ICD-10-CM | POA: Insufficient documentation

## 2019-05-02 DIAGNOSIS — J45909 Unspecified asthma, uncomplicated: Secondary | ICD-10-CM | POA: Insufficient documentation

## 2019-05-02 DIAGNOSIS — Z79899 Other long term (current) drug therapy: Secondary | ICD-10-CM | POA: Insufficient documentation

## 2019-05-02 DIAGNOSIS — R1084 Generalized abdominal pain: Secondary | ICD-10-CM | POA: Insufficient documentation

## 2019-05-02 DIAGNOSIS — F0390 Unspecified dementia without behavioral disturbance: Secondary | ICD-10-CM | POA: Diagnosis not present

## 2019-05-02 LAB — COMPREHENSIVE METABOLIC PANEL
ALT: 13 U/L (ref 0–44)
AST: 22 U/L (ref 15–41)
Albumin: 4.7 g/dL (ref 3.5–5.0)
Alkaline Phosphatase: 114 U/L (ref 38–126)
Anion gap: 9 (ref 5–15)
BUN: 19 mg/dL (ref 8–23)
CO2: 28 mmol/L (ref 22–32)
Calcium: 9.8 mg/dL (ref 8.9–10.3)
Chloride: 100 mmol/L (ref 98–111)
Creatinine, Ser: 0.74 mg/dL (ref 0.44–1.00)
GFR calc Af Amer: >60 mL/min (ref >60)
GFR calc non Af Amer: >60 mL/min (ref >60)
Glucose, Bld: 103 mg/dL — ABNORMAL HIGH (ref 70–99)
Potassium: 4.4 mmol/L (ref 3.5–5.1)
Sodium: 137 mmol/L (ref 135–145)
Total Bilirubin: 0.8 mg/dL (ref 0.3–1.2)
Total Protein: 9.3 g/dL — ABNORMAL HIGH (ref 6.5–8.1)

## 2019-05-02 LAB — CBC WITH DIFFERENTIAL/PLATELET
Abs Immature Granulocytes: 0.06 10*3/uL (ref 0.00–0.07)
Basophils Absolute: 0 10*3/uL (ref 0.0–0.1)
Basophils Relative: 0 %
Eosinophils Absolute: 0 10*3/uL (ref 0.0–0.5)
Eosinophils Relative: 0 %
HCT: 44.8 % (ref 36.0–46.0)
Hemoglobin: 14.5 g/dL (ref 12.0–15.0)
Immature Granulocytes: 0 %
Lymphocytes Relative: 10 %
Lymphs Abs: 1.5 10*3/uL (ref 0.7–4.0)
MCH: 29.2 pg (ref 26.0–34.0)
MCHC: 32.4 g/dL (ref 30.0–36.0)
MCV: 90.3 fL (ref 80.0–100.0)
Monocytes Absolute: 1.1 10*3/uL — ABNORMAL HIGH (ref 0.1–1.0)
Monocytes Relative: 7 %
Neutro Abs: 13 10*3/uL — ABNORMAL HIGH (ref 1.7–7.7)
Neutrophils Relative %: 83 %
Platelets: 387 10*3/uL (ref 150–400)
RBC: 4.96 MIL/uL (ref 3.87–5.11)
RDW: 13.7 % (ref 11.5–15.5)
WBC: 15.7 10*3/uL — ABNORMAL HIGH (ref 4.0–10.5)
nRBC: 0 % (ref 0.0–0.2)

## 2019-05-02 LAB — URINALYSIS, ROUTINE W REFLEX MICROSCOPIC
Bacteria, UA: NONE SEEN
Bilirubin Urine: NEGATIVE
Glucose, UA: NEGATIVE mg/dL
Ketones, ur: NEGATIVE mg/dL
Leukocytes,Ua: NEGATIVE
Nitrite: NEGATIVE
Protein, ur: 30 mg/dL — AB
Specific Gravity, Urine: 1.013 (ref 1.005–1.030)
pH: 8 (ref 5.0–8.0)

## 2019-05-02 LAB — ETHANOL: Alcohol, Ethyl (B): <10 mg/dL (ref <10)

## 2019-05-02 LAB — LIPASE, BLOOD: Lipase: 31 U/L (ref 11–51)

## 2019-05-02 LAB — VALPROIC ACID LEVEL: Valproic Acid Lvl: <10 ug/mL — ABNORMAL LOW (ref 50.0–100.0)

## 2019-05-02 MED ORDER — SODIUM CHLORIDE (PF) 0.9 % IJ SOLN
INTRAMUSCULAR | Status: AC
  Filled 2019-05-02: qty 50

## 2019-05-02 MED ORDER — SODIUM CHLORIDE 0.9 % IV SOLN
INTRAVENOUS | Status: DC
  Administered 2019-05-02: 18:00:00 via INTRAVENOUS

## 2019-05-02 MED ORDER — ONDANSETRON HCL 4 MG/2ML IJ SOLN
4.0000 mg | Freq: Once | INTRAMUSCULAR | Status: AC
  Administered 2019-05-02: 4 mg via INTRAVENOUS
  Filled 2019-05-02: qty 2

## 2019-05-02 MED ORDER — IOHEXOL 300 MG/ML  SOLN
100.0000 mL | Freq: Once | INTRAMUSCULAR | Status: AC | PRN
  Administered 2019-05-02: 100 mL via INTRAVENOUS

## 2019-05-02 MED ORDER — SODIUM CHLORIDE 0.9 % IV BOLUS
500.0000 mL | Freq: Once | INTRAVENOUS | Status: AC
  Administered 2019-05-02: 500 mL via INTRAVENOUS

## 2019-05-02 NOTE — ED Notes (Signed)
Pure wick has been placed. Suction set to 45mmHg.  

## 2019-05-02 NOTE — ED Notes (Signed)
XR at bedside

## 2019-05-02 NOTE — ED Notes (Signed)
[facility] called for transport.  

## 2019-05-02 NOTE — ED Provider Notes (Signed)
[facility] DEPT Provider Note   CSN: [id number] Arrival date & time: 05/02/19  1249     History   Chief Complaint Chief Complaint  Patient presents with  . Abdominal Pain    HPI Lindsey [name] is a 63 y.o. female.     Patient brought in by EMS.  From home.  Complaining abdominal pain for 2 days per family.  Patient is rubbing her abdomen.  Patient has a history of dementia and seizures.  Noncompliant with medications.  Family states no nausea vomiting or diarrhea.  Patient lives with family.  Past medical history significant for asthma history of alcohol abuse and history of seizures.  Patient is on Depakote and Keppra.  We will send off Depakote level.     Past Medical History:  Diagnosis Date  . Asthma   . H/O ETOH abuse   . Seizure [facility])     Patient Active Problem List   Diagnosis Date Noted  . Anemia   . Alcohol withdrawal delirium ([place]) 03/26/2018  . Seizures ([place]) 06/26/2016  . Patient's noncompliance with other medical treatment and regimen 06/26/2016  . Leukocytosis 06/26/2016  . Tobacco abuse 06/26/2016  . Alcohol withdrawal seizure ([place]) 06/25/2016  . Psychosis ([place]) 11/04/2015  . Essential hypertension 11/03/2015  . 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 Asthma 10/10/2015  . Seizure disorder ([place]) 08/15/2015  . Status epilepticus ([place]) 08/15/2015  . Post-ictal state ([place]) 03/01/2015  . Seizure secondary to subtherapeutic anticonvulsant medication ([place]) 02/06/2015  . Recurrent seizures ([place]) 02/06/2015  . Acute encephalopathy   . Polysubstance abuse ([place])   . Alcohol dependence with withdrawal with complication ([place]) 41/96/2229  . Protein-calorie malnutrition, severe ([place]) 03/04/2014  . Encephalopathy 03/04/2014  . Alcohol abuse 11/06/2013  . Alcohol withdrawal ([place]) 11/06/2013  . Marijuana abuse 11/06/2013  . Seizure ([place]) 11/05/2013  . Altered mental status 11/05/2013    Past Surgical History:   Procedure Laterality Date  . ABDOMINAL HYSTERECTOMY    . GALLBLADDER SURGERY       OB History   No obstetric history on file.      Home Medications    Prior to Admission medications   Medication Sig Start Date End Date Taking? Authorizing Provider  acetaminophen (TYLENOL) 500 MG tablet Take 500 mg by mouth every 8 (eight) hours as needed for mild pain.     [provider]  albuterol (PROAIR HFA) 108 (90 Base) MCG/ACT inhaler Inhale 2 puffs into the lungs every 6 (six) hours as needed for wheezing or shortness of breath.    [provider]  albuterol (PROVENTIL) (2.5 MG/3ML) 0.083% nebulizer solution Take 2.5 mg by nebulization every 6 (six) hours as needed for wheezing or shortness of breath.    [provider]  amLODipine (NORVASC) 10 MG tablet Take 1 tablet (10 mg total) by mouth daily. 03/30/18   [name], MD  calcium-vitamin D 250-100 MG-UNIT tablet Take 1 tablet by mouth 2 (two) times daily. 01/29/18   [name], John, MD  cholecalciferol (VITAMIN D) 1000 units tablet Take 1,000 Units by mouth daily.    [provider]  diclofenac sodium (VOLTAREN) 1 % GEL Apply 2 g topically daily as needed (arthritis pain).    [provider]  divalproex (DEPAKOTE) 500 MG DR tablet Take 1 tablet (500 mg total) by mouth 2 (two) times daily. 05/22/18   Patrecia Pour, MD  folic acid (FOLVITE) 1 MG tablet Take 1 mg by mouth daily.  [provider]  levETIRAcetam (KEPPRA) 750 MG tablet Take 1 tablet (750 mg total) by mouth 2 (two) times daily. 05/22/18   Patrecia Pour, MD  magnesium oxide (MAG-OX) 400 MG tablet Take 400 mg by mouth 3 (three) times daily.    [provider]  Multiple Vitamin (MULTIVITAMIN) tablet Take 1 tablet by mouth daily.    [provider]  nystatin cream (MYCOSTATIN) Apply 1 application topically 2 (two) times daily. Under breast area.    [provider]  pantoprazole (PROTONIX) 40 MG tablet Take  1 tablet (40 mg total) by mouth 2 (two) times daily. 03/30/18 03/30/19  [name], MD  thiamine 100 MG tablet Take 1 tablet (100 mg total) by mouth daily. 05/03/17   Aline August, MD    Family History Family History  Problem Relation Age of Onset  . Seizures Mother   . Alcohol abuse Mother   . Heart attack Father   . Drug abuse Brother   . Cancer Brother        Pancreatic    Social History Social History   Tobacco Use  . Smoking status: Current Some Day Smoker    Types: Cigarettes  . Smokeless tobacco: Never Used  Substance Use Topics  . Alcohol use: No    Comment: Hx of alchol abuse, not currently drinking  . Drug use: Yes    Types: Marijuana    Comment: "every now and then"     Allergies   Kiwi extract, Morphine and related, Penicillins, and Strawberry extract   Review of Systems Review of Systems  Unable to perform ROS: Dementia     Physical Exam Updated Vital Signs BP (!) 203/98   Pulse 92   Temp 97.8 F (36.6 C) (Oral)   Resp 20   SpO2 98%   Physical Exam Vitals signs and nursing note reviewed.  Constitutional:      General: She is not in acute distress.    Appearance: Normal appearance. She is well-developed.  HENT:     Head: Normocephalic and atraumatic.  Eyes:     Extraocular Movements: Extraocular movements intact.     Conjunctiva/sclera: Conjunctivae normal.     Pupils: Pupils are equal, round, and reactive to light.  Neck:     Musculoskeletal: Neck supple.  Cardiovascular:     Rate and Rhythm: Normal rate and regular rhythm.     Heart sounds: No murmur.  Pulmonary:     Effort: Pulmonary effort is normal. No respiratory distress.     Breath sounds: Normal breath sounds.  Abdominal:     Palpations: Abdomen is soft.     Tenderness: There is abdominal tenderness.  Musculoskeletal:        General: No swelling.  Skin:    General: Skin is warm and dry.  Neurological:     Mental Status: She is alert. Mental status is at baseline.       ED Treatments / Results  Labs (all labs ordered are listed, but only abnormal results are displayed) Labs Reviewed  CBC WITH DIFFERENTIAL/PLATELET  COMPREHENSIVE METABOLIC PANEL  LIPASE, BLOOD  URINALYSIS, ROUTINE W REFLEX MICROSCOPIC    EKG EKG Interpretation  Date/Time:  Wednesday May 02 2019 13:37:44 EDT Ventricular Rate:  92 PR Interval:    QRS Duration: 100 QT Interval:  401 QTC Calculation: 497 R Axis:   64 Text Interpretation:  Sinus rhythm Borderline prolonged QT interval Baseline wander in lead(s) V1 Confirmed by Fredia Sorrow [phone]) on 05/02/2019  2:43:27 PM   Radiology Dg Chest Port 1 View  Result Date: 05/02/2019 CLINICAL DATA:  Diffuse abdominal pain. EXAM: PORTABLE CHEST 1 VIEW COMPARISON:  11/04/2015 FINDINGS: Cardiac silhouette is slightly prominent. Pulmonary vascularity is at the upper limits of normal. Aortic atherosclerosis. Lungs are clear. No effusions. No acute bone abnormality. IMPRESSION: 1. Possible in slight cardiomegaly. 2. Aortic atherosclerosis. Electronically Signed   By: Lorriane Shire M.D.   On: 05/02/2019 13:50    Procedures Procedures (including critical care time)  Medications Ordered in ED Medications  0.9 %  sodium chloride infusion (has no administration in time range)  sodium chloride 0.9 % bolus 500 mL (has no administration in time range)  ondansetron (ZOFRAN) injection 4 mg (has no administration in time range)     Initial Impression / Assessment and Plan / ED Course  I have reviewed the triage vital signs and the nursing notes.  Pertinent labs & imaging results that were available during my care of the patient were reviewed by me and considered in my medical decision making (see chart for details).        Patient's chest x-ray negative.  Patient's labs and CT scan of abdomen are pending.  Discussed with evening emergency physician who will follow-up these results.  Disposition will be based on this.  Patient  afebrile not tachycardic blood pressure is actually on the high side.  Patient has a history of being noncompliant with her medications.  EKG without acute changes.  Final Clinical Impressions(s) / ED Diagnoses   Final diagnoses:  Generalized abdominal pain  Dementia without behavioral disturbance, unspecified dementia type [facility])    ED Discharge Orders    None       Fredia Sorrow, MD 05/02/19 1531

## 2019-05-02 NOTE — ED Notes (Signed)
MD made aware of delay in obtaining line and labs due to difficult stick.

## 2019-05-02 NOTE — ED Notes (Signed)
Patient transported to CT 

## 2019-05-02 NOTE — ED Notes (Signed)
Unsuccessful IV attempt x2.  

## 2019-05-02 NOTE — ED Provider Notes (Signed)
Pt signed out by Dr. Rogene Houston at shift change.  Pt has a mild elevation in WBC, but otherwise labs are nl.  Ct abd/pelvis is nl except for a small hiatal hernia.  She also had a negative CXR.  Pt is hypertensive, but has not been compliant with her meds.  Pt d/w her daughter, [name].  It sounds like [name] has been having a hard time with caring for her mom, so I ordered home health.  Pt is stable for d/c home.  Return if worse.  F/u with pcp.   Isla Pence, MD 05/02/19 [phone]

## 2019-05-02 NOTE — ED Triage Notes (Signed)
Per EMS, from home, c/o abdominal pain x2 days per family. Hx dementia and seizures. Non complaint with medications. Denies N/V/D. Lives at home with family.   BP 208/108

## 2019-05-02 NOTE — ED Notes (Signed)
IV team at bedside 

## 2019-05-02 NOTE — ED Notes (Signed)
With patient permission, patient's daughter provided with update.  [name] [phone]

## 2019-05-02 NOTE — ED Notes (Signed)
ED Provider at bedside. 

## 2019-05-02 NOTE — ED Notes (Signed)
Charge RN asked to attempt IV.

## 2019-07-06 ENCOUNTER — Emergency Department: Payer: Medicaid Other

## 2019-07-06 ENCOUNTER — Inpatient Hospital Stay
Admission: EM | Admit: 2019-07-06 | Discharge: 2019-07-09 | DRG: 191 | Disposition: A | Discharge status: Home / Self Care | Payer: Medicaid Other | Attending: Internal Medicine | Admitting: Internal Medicine

## 2019-07-06 ENCOUNTER — Other Ambulatory Visit: Payer: Self-pay

## 2019-07-06 DIAGNOSIS — J441 Chronic obstructive pulmonary disease with (acute) exacerbation: Principal | ICD-10-CM | POA: Diagnosis present

## 2019-07-06 DIAGNOSIS — Z9119 Patient's noncompliance with other medical treatment and regimen: Secondary | ICD-10-CM

## 2019-07-06 DIAGNOSIS — R1084 Generalized abdominal pain: Secondary | ICD-10-CM

## 2019-07-06 DIAGNOSIS — R0600 Dyspnea, unspecified: Secondary | ICD-10-CM

## 2019-07-06 DIAGNOSIS — Z800 Family history of malignant neoplasm of digestive organs: Secondary | ICD-10-CM

## 2019-07-06 DIAGNOSIS — J45901 Unspecified asthma with (acute) exacerbation: Secondary | ICD-10-CM | POA: Diagnosis present

## 2019-07-06 DIAGNOSIS — F330 Major depressive disorder, recurrent, mild: Secondary | ICD-10-CM | POA: Diagnosis present

## 2019-07-06 DIAGNOSIS — F419 Anxiety disorder, unspecified: Secondary | ICD-10-CM | POA: Diagnosis present

## 2019-07-06 DIAGNOSIS — Z8249 Family history of ischemic heart disease and other diseases of the circulatory system: Secondary | ICD-10-CM

## 2019-07-06 DIAGNOSIS — Z7982 Long term (current) use of aspirin: Secondary | ICD-10-CM

## 2019-07-06 DIAGNOSIS — Z91018 Allergy to other foods: Secondary | ICD-10-CM

## 2019-07-06 DIAGNOSIS — Z880 Allergy status to penicillin: Secondary | ICD-10-CM

## 2019-07-06 DIAGNOSIS — Z79899 Other long term (current) drug therapy: Secondary | ICD-10-CM

## 2019-07-06 DIAGNOSIS — F17210 Nicotine dependence, cigarettes, uncomplicated: Secondary | ICD-10-CM | POA: Diagnosis present

## 2019-07-06 DIAGNOSIS — Z885 Allergy status to narcotic agent status: Secondary | ICD-10-CM

## 2019-07-06 DIAGNOSIS — F1011 Alcohol abuse, in remission: Secondary | ICD-10-CM | POA: Diagnosis present

## 2019-07-06 DIAGNOSIS — Z20828 Contact with and (suspected) exposure to other viral communicable diseases: Secondary | ICD-10-CM | POA: Diagnosis present

## 2019-07-06 DIAGNOSIS — G4700 Insomnia, unspecified: Secondary | ICD-10-CM | POA: Diagnosis present

## 2019-07-06 DIAGNOSIS — R778 Other specified abnormalities of plasma proteins: Secondary | ICD-10-CM

## 2019-07-06 DIAGNOSIS — Z23 Encounter for immunization: Secondary | ICD-10-CM

## 2019-07-06 DIAGNOSIS — G40909 Epilepsy, unspecified, not intractable, without status epilepticus: Secondary | ICD-10-CM | POA: Diagnosis present

## 2019-07-06 NOTE — ED Triage Notes (Signed)
Patient has had shortness of breath since 2200hrs. Patient does not take oxygen at home.

## 2019-07-06 NOTE — ED Provider Notes (Signed)
[facility] Emergency Department Provider Note  ____________________________________________   First MD Initiated Contact with Patient 07/06/19 2304     (approximate)  I have reviewed the triage vital signs and the nursing notes.   HISTORY  Chief Complaint Shortness of Breath    HPI Lindsey [name] is a 63 y.o. female with extensive history as listed below who reports that she stopped using drugs and alcohol a few years ago and is completely clean except for continue to smoke cigarettes.  She presents by EMS tonight for acute onset shortness of breath that she states occurred when she lied down for bed at approximately 10:00 PM (about an hour and a half ago).  She thinks that she may have a diagnosis of COPD but she is not sure.  She does not remember ever being diagnosed with CHF.  She says she has not been around anyone with COVID-19.  She feels better when she sits up and worse when she lies down.  She feels like she is wheezing.  She denies fever/chills, sore throat, nausea, vomiting, chest pain, and dysuria.  She also has some pain in her lower abdomen that is a little bit on the right side.  She claims that she was told she has a "cyst" back "in my drinking days" and she is not sure if she still has the cyst.  She is not sure whether she means a pseudocyst on her pancreas or an ovarian cyst.  She denies a history of heart attacks and denies blood clots in the legs of the lungs.  She has no unilateral leg pain or swelling.        Past Medical History:  Diagnosis Date  . Asthma   . H/O ETOH abuse   . Seizure [facility])     Patient Active Problem List   Diagnosis Date Noted  . Anemia   . Alcohol withdrawal delirium ([place]) 03/26/2018  . Seizures ([place]) 06/26/2016  . Patient's noncompliance with other medical treatment and regimen 06/26/2016  . Leukocytosis 06/26/2016  . Tobacco abuse 06/26/2016  . Alcohol withdrawal seizure ([place]) 06/25/2016  . Psychosis  ([place]) 11/04/2015  . Essential hypertension 11/03/2015  . 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 Asthma 10/10/2015  . Seizure disorder ([place]) 08/15/2015  . Status epilepticus ([place]) 08/15/2015  . Post-ictal state ([place]) 03/01/2015  . Seizure secondary to subtherapeutic anticonvulsant medication ([place]) 02/06/2015  . Recurrent seizures ([place]) 02/06/2015  . Acute encephalopathy   . Polysubstance abuse ([place])   . Alcohol dependence with withdrawal with complication ([place]) [id number]  . Protein-calorie malnutrition, severe ([place]) 03/04/2014  . Encephalopathy 03/04/2014  . Alcohol abuse 11/06/2013  . Alcohol withdrawal ([place]) 11/06/2013  . Marijuana abuse 11/06/2013  . Seizure ([place]) 11/05/2013  . Altered mental status 11/05/2013    Past Surgical History:  Procedure Laterality Date  . ABDOMINAL HYSTERECTOMY    . GALLBLADDER SURGERY      Prior to Admission medications   Medication Sig Start Date End Date Taking? Authorizing Provider  acetaminophen (TYLENOL) 500 MG tablet Take 500 mg by mouth every 8 (eight) hours as needed for mild pain.     [provider]  albuterol (PROAIR HFA) 108 (90 Base) MCG/ACT inhaler Inhale 2 puffs into the lungs every 6 (six) hours as needed for wheezing or shortness of breath.    [provider]  albuterol (PROVENTIL) (2.5 MG/3ML) 0.083% nebulizer solution Take 2.5 mg by nebulization every 6 (six) hours as needed for wheezing or  shortness of breath.    [provider]  amLODipine (NORVASC) 10 MG tablet Take 1 tablet (10 mg total) by mouth daily. 03/30/18   [name], MD  calcium-vitamin D 250-100 MG-UNIT tablet Take 1 tablet by mouth 2 (two) times daily. 01/29/18   [name], John, MD  cholecalciferol (VITAMIN D) 1000 units tablet Take 1,000 Units by mouth daily.    [provider]  diclofenac sodium (VOLTAREN) 1 % GEL Apply 2 g topically daily as needed (arthritis pain).    [provider]  divalproex  (DEPAKOTE) 500 MG DR tablet Take 1 tablet (500 mg total) by mouth 2 (two) times daily. 05/22/18   Patrecia Pour, MD  folic acid (FOLVITE) 1 MG tablet Take 1 mg by mouth daily.    [provider]  levETIRAcetam (KEPPRA) 750 MG tablet Take 1 tablet (750 mg total) by mouth 2 (two) times daily. 05/22/18   Patrecia Pour, MD  magnesium oxide (MAG-OX) 400 MG tablet Take 400 mg by mouth 3 (three) times daily.    [provider]  Multiple Vitamin (MULTIVITAMIN) tablet Take 1 tablet by mouth daily.    [provider]  nystatin cream (MYCOSTATIN) Apply 1 application topically 2 (two) times daily. Under breast area.    [provider]  pantoprazole (PROTONIX) 40 MG tablet Take 1 tablet (40 mg total) by mouth 2 (two) times daily. 03/30/18 03/30/19  [name], MD  thiamine 100 MG tablet Take 1 tablet (100 mg total) by mouth daily. 05/03/17   Aline August, MD    Allergies Kiwi extract, Morphine and related, Penicillins, and Strawberry extract  Family History  Problem Relation Age of Onset  . Seizures Mother   . Alcohol abuse Mother   . Heart attack Father   . Drug abuse Brother   . Cancer Brother        Pancreatic    Social History Social History   Tobacco Use  . Smoking status: Current Some Day Smoker    Types: Cigarettes  . Smokeless tobacco: Never Used  Substance Use Topics  . Alcohol use: No    Comment: Hx of alchol abuse, not currently drinking  . Drug use: Yes    Types: Marijuana    Comment: "every now and then"    Review of Systems Constitutional: No fever/chills Eyes: No visual changes. ENT: No sore throat. Cardiovascular: Denies chest pain. Respiratory: +shortness of breath. Gastrointestinal: + Lower abdominal pain.  No nausea, no vomiting.  No diarrhea.  No constipation. Genitourinary: Negative for dysuria. Musculoskeletal: Negative for neck pain.  Negative for back pain. Integumentary: Negative for rash. Neurological: Negative for  headaches, focal weakness or numbness.   ____________________________________________   PHYSICAL EXAM:  ED Triage Vitals  Enc Vitals Group     BP 07/06/19 2307 (!) 145/95     Pulse Rate 07/06/19 2307 90     Resp 07/06/19 2307 19     Temp 07/06/19 2307 98 F (36.7 C)     Temp src --      SpO2 07/06/19 2307 96 %     Weight 07/06/19 2308 70.8 kg (156 lb)     Height 07/06/19 2308 1.727 m (5\' 8" )     Head Circumference --      Peak Flow --      Pain Score 07/06/19 2308 10     Pain Loc --      Pain Edu? --      Excl. in  GC? --      Constitutional: Alert and oriented.  Appears older than chronological age but is not in acute distress right now. Eyes: Conjunctivae are normal.  Head: Atraumatic. Nose: No congestion/rhinnorhea. Mouth/Throat: Mucous membranes are moist. Neck: No stridor.  No meningeal signs.   Cardiovascular: Normal rate, regular rhythm. Good peripheral circulation. Grossly normal heart sounds. Respiratory: Normal respiratory effort.  No retractions.  No tachypnea.  She has expiratory wheezing throughout all lung fields and no significant crackles or coarse breath sounds. Gastrointestinal: Soft and nondistended.  Mild tenderness to palpation of the suprapubic region and the right lower quadrant with no rebound and no guarding. Musculoskeletal: No lower extremity tenderness nor edema. No gross deformities of extremities. Neurologic:  Normal speech and language. No gross focal neurologic deficits are appreciated.  Skin:  Skin is warm, dry and intact. Psychiatric: Mood and affect are normal. Speech and behavior are normal.  ____________________________________________   LABS (all labs ordered are listed, but only abnormal results are displayed)  Labs Reviewed  CBC WITH DIFFERENTIAL/PLATELET - Abnormal; Notable for the following components:      Result Value   RBC 5.85 (*)    Hemoglobin 16.8 (*)    HCT 50.3 (*)    All other components within normal limits   URINALYSIS, ROUTINE W REFLEX MICROSCOPIC - Abnormal; Notable for the following components:   Color, Urine YELLOW (*)    APPearance CLEAR (*)    Hgb urine dipstick SMALL (*)    All other components within normal limits  TROPONIN I (HIGH SENSITIVITY) - Abnormal; Notable for the following components:   Troponin I (High Sensitivity) 45 (*)    All other components within normal limits  TROPONIN I (HIGH SENSITIVITY) - Abnormal; Notable for the following components:   Troponin I (High Sensitivity) 64 (*)    All other components within normal limits  SARS CORONAVIRUS 2 (TAT 6-24 HRS)  BRAIN NATRIURETIC PEPTIDE  LACTIC ACID, PLASMA  URINE DRUG SCREEN, QUALITATIVE ([facility] ONLY)  COMPREHENSIVE METABOLIC PANEL  LIPASE, BLOOD   ____________________________________________  EKG  ED ECG REPORT I, Hinda Kehr, the attending physician, personally viewed and interpreted this ECG.  Date: 07/06/2019 EKG Time: 22: 54 Rate: 88 Rhythm: normal sinus rhythm, probable left ventricular hypertrophy QRS Axis: normal Intervals: Mildly prolonged QTC at 509 ms ST/T Wave abnormalities: Non-specific ST segment / T-wave changes, but no clear evidence of acute ischemia. Narrative Interpretation: no definitive evidence of acute ischemia; does not meet STEMI criteria.   ____________________________________________  RADIOLOGY I, Hinda Kehr, personally viewed and evaluated these images (plain radiographs) as part of my medical decision making, as well as reviewing the written report by the radiologist.  ED MD interpretation:  Atelectasis, no clear abnormality.  CT abd/pelvis is pending.  Official radiology report(s): Dg Chest Port 1 View  Result Date: 07/07/2019 CLINICAL DATA:  Shortness of breath EXAM: PORTABLE CHEST 1 VIEW COMPARISON:  11/02/2015 FINDINGS: The heart size is stable. There is no pneumothorax. There are streaky airspace opacities at the lung bases bilaterally. There is no large pleural  effusion. Aortic calcifications are noted. There is no acute osseous abnormality. IMPRESSION: Probable atelectasis at the lung bases bilaterally. Otherwise, no acute cardiopulmonary process. Electronically Signed   By: Constance Holster M.D.   On: 07/07/2019 00:01    ____________________________________________   PROCEDURES   Procedure(s) performed (including Critical Care):  Procedures   ____________________________________________   INITIAL IMPRESSION / MDM / [place] / ED COURSE  As part  of my medical decision making, I reviewed the following data within the [place] notes reviewed and incorporated, Labs reviewed , EKG interpreted , Old chart reviewed, Radiograph reviewed , Discussed with admitting physician (Dr. Tressia Miners) and Notes from prior ED visits   Differential diagnosis includes, but is not limited to, COPD exacerbation, CHF, ACS (atypical presentation), PE, pneumonia, COVID-19.  The patient is in no respiratory distress now but is having some wheezing.  She describes the symptoms starting rather acutely when she lied down which may suggest a heart failure issue rather than COPD but her physical exam is more consistent with COPD which certainly also corresponds with her tobacco history.  Her EKG has some artifact but shows no sign of ischemia.  I have ordered a broad laboratory work-up and will reassess particular after seeing her chest x-ray.  I will hold off on treatment right now given the current COVID-19 pandemic.      Clinical Course as of Jul 07 727  Sat Jul 07, 2019  [id number] No evidence of acute infection  Urinalysis, Routine w reflex microscopic(!) [CF]  [phone] Patient says she is still having some abdominal pain (previously it had resolved).  Troponin has increased from prior.  No oxygen dependence, doubt COVID, but will send non-emergent swab so patient can be admitted for dyspnea, rising troponin, and abdominal pain.  Ordered CT  abd/pelvis w/ IV contrast.  Discussed case with Dr. Tressia Miners with the hospitalist service who will admit assuming no surgical issue on CT scan.   [CF]    Clinical Course User Index [CF] Hinda Kehr, MD     ____________________________________________  FINAL CLINICAL IMPRESSION(S) / ED DIAGNOSES  Final diagnoses:  Dyspnea, unspecified type  Generalized abdominal pain  Elevated troponin level     MEDICATIONS GIVEN DURING THIS VISIT:  Medications  sodium chloride 0.9 % bolus 500 mL (0 mLs Intravenous Stopped 07/07/19 0530)     ED Discharge Orders    None      *Please note:  Lindsey [name] was evaluated in Emergency Department on 07/07/2019 for the symptoms described in the history of present illness. She was evaluated in the context of the global COVID-19 pandemic, which necessitated consideration that the patient might be at risk for infection with the SARS-CoV-2 virus that causes COVID-19. Institutional protocols and algorithms that pertain to the evaluation of patients at risk for COVID-19 are in a state of rapid change based on information released by regulatory bodies including the CDC and federal and state organizations. These policies and algorithms were followed during the patient's care in the ED.  Some ED evaluations and interventions may be delayed as a result of limited staffing during the pandemic.*  Note:  This document was prepared using Dragon voice recognition software and may include unintentional dictation errors.   Hinda Kehr, MD 07/07/19 [phone]

## 2019-07-07 ENCOUNTER — Emergency Department: Payer: Medicaid Other

## 2019-07-07 ENCOUNTER — Encounter: Payer: Self-pay | Admitting: Radiology

## 2019-07-07 DIAGNOSIS — R1084 Generalized abdominal pain: Secondary | ICD-10-CM | POA: Diagnosis not present

## 2019-07-07 DIAGNOSIS — G4700 Insomnia, unspecified: Secondary | ICD-10-CM | POA: Diagnosis present

## 2019-07-07 DIAGNOSIS — F17210 Nicotine dependence, cigarettes, uncomplicated: Secondary | ICD-10-CM | POA: Diagnosis present

## 2019-07-07 DIAGNOSIS — J441 Chronic obstructive pulmonary disease with (acute) exacerbation: Secondary | ICD-10-CM | POA: Diagnosis not present

## 2019-07-07 DIAGNOSIS — F1011 Alcohol abuse, in remission: Secondary | ICD-10-CM | POA: Diagnosis present

## 2019-07-07 DIAGNOSIS — Z79899 Other long term (current) drug therapy: Secondary | ICD-10-CM | POA: Diagnosis not present

## 2019-07-07 DIAGNOSIS — Z20828 Contact with and (suspected) exposure to other viral communicable diseases: Secondary | ICD-10-CM | POA: Diagnosis present

## 2019-07-07 DIAGNOSIS — J45901 Unspecified asthma with (acute) exacerbation: Secondary | ICD-10-CM | POA: Diagnosis present

## 2019-07-07 DIAGNOSIS — F330 Major depressive disorder, recurrent, mild: Secondary | ICD-10-CM | POA: Diagnosis present

## 2019-07-07 DIAGNOSIS — Z8249 Family history of ischemic heart disease and other diseases of the circulatory system: Secondary | ICD-10-CM | POA: Diagnosis not present

## 2019-07-07 DIAGNOSIS — Z91018 Allergy to other foods: Secondary | ICD-10-CM | POA: Diagnosis not present

## 2019-07-07 DIAGNOSIS — Z7982 Long term (current) use of aspirin: Secondary | ICD-10-CM | POA: Diagnosis not present

## 2019-07-07 DIAGNOSIS — Z9119 Patient's noncompliance with other medical treatment and regimen: Secondary | ICD-10-CM | POA: Diagnosis not present

## 2019-07-07 DIAGNOSIS — Z880 Allergy status to penicillin: Secondary | ICD-10-CM | POA: Diagnosis not present

## 2019-07-07 DIAGNOSIS — Z885 Allergy status to narcotic agent status: Secondary | ICD-10-CM | POA: Diagnosis not present

## 2019-07-07 DIAGNOSIS — F419 Anxiety disorder, unspecified: Secondary | ICD-10-CM | POA: Diagnosis present

## 2019-07-07 DIAGNOSIS — Z800 Family history of malignant neoplasm of digestive organs: Secondary | ICD-10-CM | POA: Diagnosis not present

## 2019-07-07 DIAGNOSIS — G40909 Epilepsy, unspecified, not intractable, without status epilepticus: Secondary | ICD-10-CM | POA: Diagnosis present

## 2019-07-07 DIAGNOSIS — Z23 Encounter for immunization: Secondary | ICD-10-CM | POA: Diagnosis not present

## 2019-07-07 LAB — CBC WITH DIFFERENTIAL/PLATELET
Abs Immature Granulocytes: 0.03 10*3/uL (ref 0.00–0.07)
Basophils Absolute: 0 10*3/uL (ref 0.0–0.1)
Basophils Relative: 1 %
Eosinophils Absolute: 0.1 10*3/uL (ref 0.0–0.5)
Eosinophils Relative: 2 %
HCT: 50.3 % — ABNORMAL HIGH (ref 36.0–46.0)
Hemoglobin: 16.8 g/dL — ABNORMAL HIGH (ref 12.0–15.0)
Immature Granulocytes: 0 %
Lymphocytes Relative: 27 %
Lymphs Abs: 2.3 10*3/uL (ref 0.7–4.0)
MCH: 28.7 pg (ref 26.0–34.0)
MCHC: 33.4 g/dL (ref 30.0–36.0)
MCV: 86 fL (ref 80.0–100.0)
Monocytes Absolute: 0.6 10*3/uL (ref 0.1–1.0)
Monocytes Relative: 7 %
Neutro Abs: 5.4 10*3/uL (ref 1.7–7.7)
Neutrophils Relative %: 63 %
Platelets: 203 10*3/uL (ref 150–400)
RBC: 5.85 MIL/uL — ABNORMAL HIGH (ref 3.87–5.11)
RDW: 13.7 % (ref 11.5–15.5)
WBC: 8.5 10*3/uL (ref 4.0–10.5)
nRBC: 0 % (ref 0.0–0.2)

## 2019-07-07 LAB — COMPREHENSIVE METABOLIC PANEL
ALT: <5 U/L (ref 0–44)
AST: 16 U/L (ref 15–41)
Albumin: 3.6 g/dL (ref 3.5–5.0)
Alkaline Phosphatase: 77 U/L (ref 38–126)
Anion gap: 7 (ref 5–15)
BUN: 14 mg/dL (ref 8–23)
CO2: 28 mmol/L (ref 22–32)
Calcium: 9.4 mg/dL (ref 8.9–10.3)
Chloride: 106 mmol/L (ref 98–111)
Creatinine, Ser: 0.62 mg/dL (ref 0.44–1.00)
GFR calc Af Amer: >60 mL/min (ref >60)
GFR calc non Af Amer: >60 mL/min (ref >60)
Glucose, Bld: 84 mg/dL (ref 70–99)
Potassium: 3.8 mmol/L (ref 3.5–5.1)
Sodium: 141 mmol/L (ref 135–145)
Total Bilirubin: 0.4 mg/dL (ref 0.3–1.2)
Total Protein: 7.1 g/dL (ref 6.5–8.1)

## 2019-07-07 LAB — URINE DRUG SCREEN, QUALITATIVE (ARMC ONLY)
Amphetamines, Ur Screen: NOT DETECTED
Barbiturates, Ur Screen: NOT DETECTED
Benzodiazepine, Ur Scrn: NOT DETECTED
Cannabinoid 50 Ng, Ur ~~LOC~~: NOT DETECTED
Cocaine Metabolite,Ur ~~LOC~~: NOT DETECTED
MDMA (Ecstasy)Ur Screen: NOT DETECTED
Methadone Scn, Ur: NOT DETECTED
Opiate, Ur Screen: NOT DETECTED
Phencyclidine (PCP) Ur S: NOT DETECTED
Tricyclic, Ur Screen: NOT DETECTED

## 2019-07-07 LAB — URINALYSIS, ROUTINE W REFLEX MICROSCOPIC
Bacteria, UA: NONE SEEN
Bilirubin Urine: NEGATIVE
Glucose, UA: NEGATIVE mg/dL
Ketones, ur: NEGATIVE mg/dL
Leukocytes,Ua: NEGATIVE
Nitrite: NEGATIVE
Protein, ur: NEGATIVE mg/dL
Specific Gravity, Urine: 1.012 (ref 1.005–1.030)
pH: 7 (ref 5.0–8.0)

## 2019-07-07 LAB — TROPONIN I (HIGH SENSITIVITY)
Troponin I (High Sensitivity): 45 ng/L — ABNORMAL HIGH (ref <18)
Troponin I (High Sensitivity): 64 ng/L — ABNORMAL HIGH (ref <18)
Troponin I (High Sensitivity): 61 ng/L — ABNORMAL HIGH (ref <18)
Troponin I (High Sensitivity): 59 ng/L — ABNORMAL HIGH (ref <18)

## 2019-07-07 LAB — HIV ANTIBODY (ROUTINE TESTING W REFLEX): HIV Screen 4th Generation wRfx: NONREACTIVE

## 2019-07-07 LAB — LIPASE, BLOOD: Lipase: 20 U/L (ref 11–51)

## 2019-07-07 LAB — LACTIC ACID, PLASMA: Lactic Acid, Venous: 1.6 mmol/L (ref 0.5–1.9)

## 2019-07-07 LAB — BRAIN NATRIURETIC PEPTIDE: B Natriuretic Peptide: 100 pg/mL (ref 0.0–100.0)

## 2019-07-07 LAB — SARS CORONAVIRUS 2 (TAT 6-24 HRS): SARS Coronavirus 2: NEGATIVE

## 2019-07-07 MED ORDER — BUDESONIDE 0.5 MG/2ML IN SUSP
2.0000 mg | Freq: Two times a day (BID) | RESPIRATORY_TRACT | Status: DC
  Administered 2019-07-07 – 2019-07-09 (×3): 2 mg via RESPIRATORY_TRACT
  Filled 2019-07-07 (×4): qty 8

## 2019-07-07 MED ORDER — CALCIUM CITRATE-VITAMIN D 500-500 MG-UNIT PO CHEW
0.5000 | CHEWABLE_TABLET | Freq: Two times a day (BID) | ORAL | Status: DC
  Administered 2019-07-07 – 2019-07-09 (×4): 0.5 via ORAL
  Filled 2019-07-07 (×6): qty 0.5

## 2019-07-07 MED ORDER — INFLUENZA VAC SPLIT QUAD 0.5 ML IM SUSY
0.5000 mL | PREFILLED_SYRINGE | INTRAMUSCULAR | Status: AC
  Administered 2019-07-09: 0.5 mL via INTRAMUSCULAR
  Filled 2019-07-07: qty 0.5

## 2019-07-07 MED ORDER — CITALOPRAM HYDROBROMIDE 20 MG PO TABS
10.0000 mg | ORAL_TABLET | Freq: Every day | ORAL | Status: DC
  Administered 2019-07-07 – 2019-07-08 (×2): 10 mg via ORAL
  Filled 2019-07-07 (×2): qty 1

## 2019-07-07 MED ORDER — IOHEXOL 300 MG/ML  SOLN
100.0000 mL | Freq: Once | INTRAMUSCULAR | Status: AC | PRN
  Administered 2019-07-07: 100 mL via INTRAVENOUS

## 2019-07-07 MED ORDER — MIRTAZAPINE 15 MG PO TABS
7.5000 mg | ORAL_TABLET | Freq: Every day | ORAL | Status: DC
  Administered 2019-07-07 – 2019-07-08 (×2): 7.5 mg via ORAL
  Filled 2019-07-07 (×2): qty 1

## 2019-07-07 MED ORDER — VITAMIN D 25 MCG (1000 UNIT) PO TABS
1000.0000 [IU] | ORAL_TABLET | Freq: Every day | ORAL | Status: DC
  Administered 2019-07-07 – 2019-07-09 (×3): 1000 [IU] via ORAL
  Filled 2019-07-07 (×3): qty 1

## 2019-07-07 MED ORDER — ALPRAZOLAM 0.5 MG PO TABS
0.5000 mg | ORAL_TABLET | Freq: Three times a day (TID) | ORAL | Status: DC | PRN
  Administered 2019-07-07: 0.5 mg via ORAL
  Filled 2019-07-07: qty 1

## 2019-07-07 MED ORDER — VITAMIN B-1 100 MG PO TABS
100.0000 mg | ORAL_TABLET | Freq: Every day | ORAL | Status: DC
  Administered 2019-07-07 – 2019-07-09 (×3): 100 mg via ORAL
  Filled 2019-07-07 (×3): qty 1

## 2019-07-07 MED ORDER — BUDESONIDE 0.5 MG/2ML IN SUSP: 2.0000 mg | Freq: Four times a day (QID) | RESPIRATORY_TRACT | Status: DC

## 2019-07-07 MED ORDER — ALBUTEROL SULFATE HFA 108 (90 BASE) MCG/ACT IN AERS: 2.0000 | INHALATION_SPRAY | Freq: Four times a day (QID) | RESPIRATORY_TRACT | Status: DC | PRN

## 2019-07-07 MED ORDER — MAGNESIUM OXIDE 400 (241.3 MG) MG PO TABS
400.0000 mg | ORAL_TABLET | Freq: Three times a day (TID) | ORAL | Status: DC
  Administered 2019-07-07 – 2019-07-09 (×7): 400 mg via ORAL
  Filled 2019-07-07 (×6): qty 1

## 2019-07-07 MED ORDER — ADULT MULTIVITAMIN W/MINERALS CH
1.0000 | ORAL_TABLET | Freq: Every day | ORAL | Status: DC
  Administered 2019-07-07 – 2019-07-09 (×3): 1 via ORAL
  Filled 2019-07-07 (×3): qty 1

## 2019-07-07 MED ORDER — PANTOPRAZOLE SODIUM 40 MG PO TBEC
40.0000 mg | DELAYED_RELEASE_TABLET | Freq: Two times a day (BID) | ORAL | Status: DC
  Administered 2019-07-07 – 2019-07-09 (×5): 40 mg via ORAL
  Filled 2019-07-07 (×5): qty 1

## 2019-07-07 MED ORDER — HYDROXYZINE HCL 10 MG PO TABS
10.0000 mg | ORAL_TABLET | Freq: Three times a day (TID) | ORAL | Status: DC | PRN
  Filled 2019-07-07: qty 1

## 2019-07-07 MED ORDER — ENOXAPARIN SODIUM 40 MG/0.4ML ~~LOC~~ SOLN
40.0000 mg | SUBCUTANEOUS | Status: DC
  Administered 2019-07-07 – 2019-07-08 (×2): 40 mg via SUBCUTANEOUS
  Filled 2019-07-07 (×2): qty 0.4

## 2019-07-07 MED ORDER — AMLODIPINE BESYLATE 5 MG PO TABS
10.0000 mg | ORAL_TABLET | Freq: Every day | ORAL | Status: DC
  Administered 2019-07-07 – 2019-07-09 (×3): 10 mg via ORAL
  Filled 2019-07-07 (×3): qty 2

## 2019-07-07 MED ORDER — SODIUM CHLORIDE 0.9 % IV BOLUS
500.0000 mL | Freq: Once | INTRAVENOUS | Status: AC
  Administered 2019-07-07: 500 mL via INTRAVENOUS

## 2019-07-07 MED ORDER — LEVETIRACETAM 750 MG PO TABS
750.0000 mg | ORAL_TABLET | Freq: Two times a day (BID) | ORAL | Status: DC
  Administered 2019-07-07 – 2019-07-09 (×5): 750 mg via ORAL
  Filled 2019-07-07 (×6): qty 1

## 2019-07-07 MED ORDER — ALBUTEROL SULFATE (2.5 MG/3ML) 0.083% IN NEBU
2.5000 mg | INHALATION_SOLUTION | Freq: Four times a day (QID) | RESPIRATORY_TRACT | Status: DC | PRN
  Administered 2019-07-07 (×2): 2.5 mg via RESPIRATORY_TRACT
  Filled 2019-07-07 (×2): qty 3

## 2019-07-07 MED ORDER — DIVALPROEX SODIUM 500 MG PO DR TAB
500.0000 mg | DELAYED_RELEASE_TABLET | Freq: Two times a day (BID) | ORAL | Status: DC
  Administered 2019-07-07 – 2019-07-09 (×5): 500 mg via ORAL
  Filled 2019-07-07 (×6): qty 1

## 2019-07-07 MED ORDER — FOLIC ACID 1 MG PO TABS
1.0000 mg | ORAL_TABLET | Freq: Every day | ORAL | Status: DC
  Administered 2019-07-07 – 2019-07-09 (×3): 1 mg via ORAL
  Filled 2019-07-07 (×3): qty 1

## 2019-07-07 MED ORDER — ACETAMINOPHEN 500 MG PO TABS
500.0000 mg | ORAL_TABLET | Freq: Three times a day (TID) | ORAL | Status: DC | PRN
  Administered 2019-07-07 – 2019-07-09 (×3): 500 mg via ORAL
  Filled 2019-07-07 (×3): qty 1

## 2019-07-07 NOTE — Progress Notes (Signed)
   07/07/19 1800  Clinical Encounter Type  Visited With Patient  Visit Type Initial;Spiritual support  Referral From Nurse  Consult/Referral To Chaplain  Spiritual Encounters  Spiritual Needs Prayer;Emotional  Stress Factors  Patient Stress Factors Other (Comment)  Chaplain received OR for prayer request. Chaplain visit patient and she was lying in bed. Chaplain introduce self and ask patient how was she feeling and patient states she is in pain. Patient move and was in a lot of pain. Chaplain offered prayer and prayed for comfort and peace from the pain. Chaplain prayed for care team. Patient said that she did not know where the pain was coming from.

## 2019-07-07 NOTE — Progress Notes (Signed)
Pt arrived to room 238.  Oriented to safety features of the room.  Bed low, brakes locked, and call bell within reach.

## 2019-07-07 NOTE — ED Notes (Signed)
This RN spoke with Zigmund Daniel, Museum/gallery curator who verified with Agricultural consultant that patient was to go upstairs. Pt just assigned to RN on 2A, this RN gave Network engineer ASCOM for [employer], RN and informed her to notify 2A RN if she had any questions to call and would roll with patient in 15 mins. Theadora Rama, Agricultural consultant notified, [place], [facility] notified.

## 2019-07-07 NOTE — ED Notes (Signed)
Pt transferred to room 238 by this tech.

## 2019-07-07 NOTE — ED Notes (Signed)
Rolling call given to 2A by Lonn [place], EDT.

## 2019-07-07 NOTE — ED Notes (Signed)
This RN spoke with Theadora Rama, RN and [place], [facility], notified them of delay for 2A. Malachy Mood, [facility] spoke with charge RN for 2A, Malachy Mood, [facility] states patient okay to go to 2A at this time

## 2019-07-07 NOTE — ED Notes (Signed)
Patient transported to CT 
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
## 2019-07-07 NOTE — ED Notes (Signed)
Admitting MD came out, verbal orders received for regular diet for patient, notified admitting MD of pending transfer to 2A.

## 2019-07-07 NOTE — ED Notes (Addendum)
Pt given hospital phone to call friend.

## 2019-07-07 NOTE — Progress Notes (Signed)
Physical Therapy Evaluation Patient Details Name: Lindsey [name] MRN: [id number] DOB: 1956/02/28 Today's Date: 07/07/2019   History of Present Illness  Lindsey [name]  is a 63 y.o. female with a known history of medical noncompliance is being admitted for acute onset shortness of breath and abdominal pain.  She reports that she stopped using drugs and alcohol a few years ago and is completely clean except for continuing to smoke cigarettes. She presents by EMS tonight for acute onset shortness of breath that she states occurred when she lied down for bed at approximately 10:00 PM (about an hour and a half ago). She says she has not been around anyone with COVID-19. She feels better when she sits up and worse when she lies down. She feels like she is wheezing. She also has some pain in her lower abdomen that is a little bit on the right side.  Clinical Impression  Pt admitted with above diagnosis. Pt currently with functional limitations due to the deficits listed below (see PT Problem List). Pt is able to perform bed mobility with modified independence. She demonstrates use of bed rails and slow speed but no external assist required by therapist. She moves slowly and requires verbal cues for safe hand placement during transfers. She is mildly unsteady in standing and requires UE support on walker to maintain balance. Pt is able to ambualte a limited distance in room with rolling walker. She ambulates very slowly and is unsteady. She uses heavy UE support on rolling walker. Elected not to ambulate farther at this time for safety. At home pt uses her wheelchair frequently for significant distances. Pt feels safe returning home at discharge. She reports that she had access previously to a walker that her friend was lending her but that it has since been reclaimed. Recommend front wheeled walker and [facility] at discharge. Pt will benefit from PT services to address deficits in strength, balance, and mobility  in order to return to full function at home.     Follow Up Recommendations Home health PT    Equipment Recommendations  Rolling walker with 5" wheels    Recommendations for Other Services       Precautions / Restrictions Precautions Precautions: Fall Restrictions Weight Bearing Restrictions: No      Mobility  Bed Mobility Overal bed mobility: Modified Independent             General bed mobility comments: Use of bed rails and slow speed but no external assist required by therapist  Transfers Overall transfer level: Needs assistance Equipment used: Rolling walker (2 wheeled) Transfers: Sit to/from Stand Sit to Stand: Min guard         General transfer comment: Pt moves slowly and requires verbal cues for safe hand placement. She is mildly unsteady in standing and requires UE support on walker to maintain balance  Ambulation/Gait Ambulation/Gait assistance: Min assist Gait Distance (Feet): 10 Feet Assistive device: Rolling walker (2 wheeled)       General Gait Details: Pt is able to ambualte a limited distance in room with rolling walker. She ambulates very slowly and is unsteady. She uses heavy UE support on rolling walker. Elected not to ambulate farther at this time for Doctor, hospital [place] (Stroke Patients Only)       Balance Overall balance assessment: Needs assistance Sitting-balance support: No upper extremity supported Sitting balance-Leahy Scale: Good  Standing balance support: Bilateral upper extremity supported Standing balance-Leahy Scale: Poor Standing balance comment: Pt requires UE support in standing to stabilize                             Pertinent Vitals/Pain Pain Assessment: 0-10 Pain Score: 5  Pain Location: Low back Pain Descriptors / Indicators: Aching Pain Intervention(s): Monitored during session    Home Living Family/patient expects to be discharged to::  Private residence Living Arrangements: Children(Daughter) Available Help at Discharge: Family;Available 24 hours/day Type of Home: Apartment Home Access: Level entry     Home Layout: One level Home Equipment: Cane - single point;Wheelchair - manual(Pt reports she had borrowed rolling walker but not currently)      Prior Function Level of Independence: Needs assistance   Gait / Transfers Assistance Needed: Pt ambulates limited distance with spc at home but uses wc for longer distances  ADL's / Homemaking Assistance Needed: Requires assist for ADLs/IADLs        Hand Dominance   Dominant Hand: Right    Extremity/Trunk Assessment   Upper Extremity Assessment Upper Extremity Assessment: Overall WFL for tasks assessed    Lower Extremity Assessment Lower Extremity Assessment: RLE deficits/detail RLE Deficits / Details: Pt with RLE pain and limited strength of R hip flexion and R knee extension secondary to pain. RLE is greater than 3+/5       Communication   Communication: No difficulties  Cognition Arousal/Alertness: Awake/alert Behavior During Therapy: WFL for tasks assessed/performed Overall Cognitive Status: Within Functional Limits for tasks assessed                                        General Comments      Exercises     Assessment/Plan    PT Assessment Patient needs continued PT services  PT Problem List Decreased strength;Decreased activity tolerance;Decreased balance       PT Treatment Interventions DME instruction;Gait training;Functional mobility training;Therapeutic activities;Therapeutic exercise;Balance training;Neuromuscular re-education    PT Goals (Current goals can be found in the Care Plan section)  Acute Rehab PT Goals Patient Stated Goal: Return to prior function at home PT Goal Formulation: With patient Time For Goal Achievement: 07/21/19 Potential to Achieve Goals: Fair    Frequency Min 2X/week   Barriers to  discharge        Co-evaluation               AM-PAC PT "6 Clicks" Mobility  Outcome Measure Help needed turning from your back to your side while in a flat bed without using bedrails?: None Help needed moving from lying on your back to sitting on the side of a flat bed without using bedrails?: None Help needed moving to and from a bed to a chair (including a wheelchair)?: A Little Help needed standing up from a chair using your arms (e.g., wheelchair or bedside chair)?: A Little Help needed to walk in hospital room?: A Little Help needed climbing 3-5 steps with a railing? : Total 6 Click Score: 18    End of Session Equipment Utilized During Treatment: Gait belt Activity Tolerance: Patient tolerated treatment well Patient left: in bed;with call bell/phone within reach;with bed alarm set   PT Visit Diagnosis: Unsteadiness on feet (R26.81);Muscle weakness (generalized) (M62.81)    Time: [id number] PT Time Calculation (min) (ACUTE ONLY): 22 min  Charges:   PT Evaluation $PT Eval Low Complexity: 1 Low          Gladyce Mcray D Diondra Pines PT, DPT, GCS   Caitlin Ainley 07/07/2019, 4:56 PM

## 2019-07-07 NOTE — ED Notes (Signed)
Pt gave this RN permission to give daughter update on POC. This RN told pt's daughter she would be updated when pt was assigned a room.

## 2019-07-07 NOTE — ED Notes (Signed)
While conducting home medication interview, patient expressed concern regarding the number of inhalers she has been prescribed. I informed her it was not uncommon for people to be using multiple inhalers as they can work in different ways. She seems to have been using [place] regularly but has an unused ALBUTEROL HFA and an unopened STIOLTO with her. She also expressed concern that the inhalers might be causing her heart to be fast. I assured her I would share her medication concerns with the treatment team.  Additionally, patient seemed worried that her PCP was not satisfactorily explaining his rationale for this inhaler regimen, going so far as to say she wasn't sure he knew what he was doing. Patient may benefit from medication management referral and/or resources on establishing care with another PCP.    ** The above is intended solely for informational and/or communicative purposes. It should in no way be considered an endorsement of any specific treatment, therapy or action. **

## 2019-07-07 NOTE — ED Notes (Signed)
Admitting MD at bedside at this time.

## 2019-07-07 NOTE — Plan of Care (Signed)

## 2019-07-07 NOTE — H&P (Signed)
[name] [place] NAME: Lindsey [name]    MR#:  [id number]  DATE OF BIRTH:  1955-11-18  DATE OF ADMISSION:  07/06/2019  PRIMARY CARE PHYSICIAN: Lindsey Market, MD   REQUESTING/REFERRING PHYSICIAN: [name], MD   CHIEF COMPLAINT:   Chief Complaint  Patient presents with  . Shortness of Breath    HISTORY OF PRESENT ILLNESS:  Lindsey [name]  is a 63 y.o. female with a known history of medical noncompliance is being admitted for acute onset shortness of breath and abdominal pain.  She reports that she stopped using drugs and alcohol a few years ago and is completely clean except for continue to smoke cigarettes.  She presents by EMS tonight for acute onset shortness of breath that she states occurred when she lied down for bed at approximately 10:00 PM (about an hour and a half ago). She says she has not been around anyone with COVID-19.  She feels better when she sits up and worse when she lies down.  She feels like she is wheezing. She also has some pain in her lower abdomen that is a little bit on the right side.  PAST MEDICAL HISTORY:   Past Medical History:  Diagnosis Date  . Asthma   . H/O ETOH abuse   . Seizure ([name])     PAST SURGICAL HISTORY:   Past Surgical History:  Procedure Laterality Date  . ABDOMINAL HYSTERECTOMY    . GALLBLADDER SURGERY      SOCIAL HISTORY:   Social History   Tobacco Use  . Smoking status: Current Some Day Smoker    Types: Cigarettes  . Smokeless tobacco: Never Used  Substance Use Topics  . Alcohol use: No    Comment: Hx of alchol abuse, not currently drinking    FAMILY HISTORY:   Family History  Problem Relation Age of Onset  . Seizures Mother   . Alcohol abuse Mother   . Heart attack Father   . Drug abuse Brother   . Cancer Brother        Pancreatic    DRUG ALLERGIES:   Allergies  Allergen Reactions  . Kiwi Extract Hives and Itching  . Morphine And Related Hives   . Penicillins Hives and Other (See Comments)    Has patient had a PCN reaction causing immediate rash, facial/tongue/throat swelling, SOB or lightheadedness with hypotension: Yes Has patient had a PCN reaction causing severe rash involving mucus membranes or skin necrosis: No Has patient had a PCN reaction that required hospitalization No Has patient had a PCN reaction occurring within the last 10 years: No If all of the above answers are "NO", then may proceed with Cephalosporin use.  . Strawberry Extract Hives and Itching    REVIEW OF SYSTEMS:   Review of Systems  Constitutional: Negative for diaphoresis, fever, malaise/fatigue and weight loss.  HENT: Negative for ear discharge, ear pain, hearing loss, nosebleeds, sore throat and tinnitus.   Eyes: Negative for blurred vision and pain.  Respiratory: Positive for shortness of breath. Negative for cough, hemoptysis and wheezing.   Cardiovascular: Negative for chest pain, palpitations, orthopnea and leg swelling.  Gastrointestinal: Positive for abdominal pain. Negative for blood in stool, constipation, diarrhea, heartburn, nausea and vomiting.  Genitourinary: Negative for dysuria, frequency and urgency.  Musculoskeletal: Negative for back pain and myalgias.  Skin: Negative for itching and rash.  Neurological: Negative for dizziness, tingling, tremors, focal weakness, seizures, weakness and headaches.  Psychiatric/Behavioral: Negative  for depression. The patient is not nervous/anxious.    MEDICATIONS AT HOME:   Prior to Admission medications   Medication Sig Start Date End Date Taking? Authorizing Provider  acetaminophen (TYLENOL) 500 MG tablet Take 500-1,000 mg by mouth every 8 (eight) hours as needed for mild pain.    Yes [provider]  albuterol (PROAIR HFA) 108 (90 Base) MCG/ACT inhaler Inhale 2 puffs into the lungs every 6 (six) hours as needed for wheezing or shortness of breath.   Yes [provider]  aspirin  EC 81 MG tablet Take 81 mg by mouth daily.   Yes [provider]  divalproex (DEPAKOTE) 500 MG DR tablet Take 1 tablet (500 mg total) by mouth 2 (two) times daily. 05/22/18  Yes Patrecia Pour, MD  fluticasone (FLOVENT HFA) 220 MCG/ACT inhaler Inhale 1-2 puffs into the lungs 2 (two) times daily.   Yes [provider]  hydrOXYzine (ATARAX/VISTARIL) 25 MG tablet Take 25 mg by mouth 3 (three) times daily as needed for anxiety or itching.   Yes [provider]  levETIRAcetam (KEPPRA) 750 MG tablet Take 1 tablet (750 mg total) by mouth 2 (two) times daily. 05/22/18  Yes Patrecia Pour, MD  meloxicam (MOBIC) 7.5 MG tablet Take 7.5 mg by mouth 2 (two) times daily.   Yes [provider]  Multiple Vitamin (MULTIVITAMIN) tablet Take 1 tablet by mouth daily.   Yes [provider]  pantoprazole (PROTONIX) 40 MG tablet Take 40 mg by mouth daily.   Yes [provider]  Vitamin D, Ergocalciferol, (DRISDOL) 1.25 MG (50000 UT) CAPS capsule Take 50,000 Units by mouth every 7 (seven) days.   Yes [provider]  amLODipine (NORVASC) 10 MG tablet Take 1 tablet (10 mg total) by mouth daily. Patient not taking: Reported on 07/07/2019 03/30/18   [name], MD  calcium-vitamin D 250-100 MG-UNIT tablet Take 1 tablet by mouth 2 (two) times daily. Patient not taking: Reported on 07/07/2019 01/29/18   [name], John, MD  Tiotropium Bromide-Olodaterol (STIOLTO RESPIMAT) 2.5-2.5 MCG/ACT AERS Inhale 2 puffs into the lungs daily.    [provider]      VITAL SIGNS:  Blood pressure (!) 151/87, pulse 72, temperature 98 F (36.7 C), resp. rate 17, height 5\' 8"  (1.727 m), weight 70.8 kg, SpO2 98 %.  PHYSICAL EXAMINATION:  Physical Exam  GENERAL:  63 y.o.-year-old patient lying in the bed with no acute distress.  EYES: Pupils equal, round, reactive to light and accommodation. No scleral icterus. Extraocular muscles intact.  HEENT: Head atraumatic,  normocephalic. Oropharynx and nasopharynx clear.  NECK:  Supple, no jugular venous distention. No thyroid enlargement, no tenderness.  LUNGS: Normal breath sounds bilaterally, no wheezing, rales,rhonchi or crepitation. No use of accessory muscles of respiration.  CARDIOVASCULAR: S1, S2 normal. No murmurs, rubs, or gallops.  ABDOMEN: Soft, nontender, nondistended. Bowel sounds present. No organomegaly or mass.  EXTREMITIES: No pedal edema, cyanosis, or clubbing.  NEUROLOGIC: Cranial nerves II through XII are intact. Muscle strength 5/5 in all extremities. Sensation intact. Gait not checked.  PSYCHIATRIC: The patient is alert and oriented x 3.  She seems somewhat anxious SKIN: No obvious rash, lesion, or ulcer.  LABORATORY PANEL:   CBC Recent Labs  Lab 07/07/19 0038  WBC 8.5  HGB 16.8*  HCT 50.3*  PLT 203   ------------------------------------------------------------------------------------------------------------------  Chemistries  Recent Labs  Lab 07/07/19 0339  NA 141  K 3.8  CL 106  CO2 28  GLUCOSE  84  BUN 14  CREATININE 0.62  CALCIUM 9.4  AST 16  ALT <5  ALKPHOS 77  BILITOT 0.4   ------------------------------------------------------------------------------------------------------------------  Cardiac Enzymes No results for input(s): TROPONINI in the last 168 hours. ------------------------------------------------------------------------------------------------------------------  RADIOLOGY:  Ct Abdomen Pelvis W Contrast  Result Date: 07/07/2019 CLINICAL DATA:  Acute generalized abdominal pain. EXAM: CT ABDOMEN AND PELVIS WITH CONTRAST TECHNIQUE: Multidetector CT imaging of the abdomen and pelvis was performed using the standard protocol following bolus administration of intravenous contrast. CONTRAST:  129mL OMNIPAQUE IOHEXOL 300 MG/ML  SOLN COMPARISON:  CT scan of May 02, 2019. FINDINGS: Lower chest: No acute abnormality. Hepatobiliary: No focal liver  abnormality is seen. Status post cholecystectomy. No biliary dilatation. Pancreas: Unremarkable. No pancreatic ductal dilatation or surrounding inflammatory changes. Spleen: Normal in size without focal abnormality. Adrenals/Urinary Tract: Adrenal glands appear normal. Large right renal cyst is noted. No hydronephrosis or renal obstruction is noted. Urinary bladder is decompressed. No renal or ureteral calculi are noted. Stomach/Bowel: The stomach appears normal. There is no evidence of bowel obstruction or inflammation. The appendix is not visualized. Vascular/Lymphatic: Aortic atherosclerosis. No enlarged abdominal or pelvic lymph nodes. Reproductive: Status post hysterectomy. No adnexal masses. Other: No abdominal wall hernia or abnormality. No abdominopelvic ascites. Musculoskeletal: No acute or significant osseous findings. IMPRESSION: No acute abnormality seen in the abdomen or pelvis. Aortic Atherosclerosis (ICD10-I70.0). Electronically Signed   By: Marijo Conception M.D.   On: 07/07/2019 08:49   Dg Chest Port 1 View  Result Date: 07/07/2019 CLINICAL DATA:  Shortness of breath EXAM: PORTABLE CHEST 1 VIEW COMPARISON:  11/02/2015 FINDINGS: The heart size is stable. There is no pneumothorax. There are streaky airspace opacities at the lung bases bilaterally. There is no large pleural effusion. Aortic calcifications are noted. There is no acute osseous abnormality. IMPRESSION: Probable atelectasis at the lung bases bilaterally. Otherwise, no acute cardiopulmonary process. Electronically Signed   By: Constance Holster M.D.   On: 07/07/2019 00:01   IMPRESSION AND PLAN:  Lindsey [name] is a 63 y.o. female with medical history significant of EtOH abuse, seizure disorder, non-compliance is being admitted for suspected asthma exacerbation  *Asthma/COPD exacerbation -We will monitor her on inhalers -Await COVID test  *Seizure disorder -Continue antiepileptics  *Alcohol abuse -Monitor for signs of  withdrawal and consider CIWA if needed  *Anxiety/history of psychosis -Xanax for now, consult psychiatry    All the records are reviewed and case discussed with ED provider. Management plans discussed with the patient, nursing and they are in agreement.  CODE STATUS: Full code  TOTAL TIME TAKING CARE OF THIS PATIENT: 45 minutes.    Max Sane M.D on 07/07/2019 at 9:43 AM  Between 7am to 6pm - Pager - [phone]  After 6pm go to [email or web address] - Proofreader  [facility]  Office  [phone]  CC: Primary care physician; Lindsey Market, MD   Note: This dictation was prepared with Dragon dictation along with smaller phrase technology. Any transcriptional errors that result from this process are unintentional.

## 2019-07-07 NOTE — ED Notes (Signed)
Called 2A to give report. Spoke with Colletta Maryland, RN who was in the process of discharging

## 2019-07-07 NOTE — ED Notes (Signed)
Pt's sheets changed and external catheter repositioned. Pt given phone to update her daughter.

## 2019-07-07 NOTE — ED Notes (Signed)
Pt given hospital phone to call daughter. [phone].
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
## 2019-07-08 NOTE — Progress Notes (Signed)
[facility] [place] NAME: Soni Seigfried    MR#:  [id number]  DATE OF BIRTH:  July 18, 1956  SUBJECTIVE:  CHIEF COMPLAINT:   Chief Complaint  Patient presents with  . Shortness of Breath  Feels crappy although cannot describe what that means, says she has no way of getting back home.  Her daughter would not provide any transportation.  She said she will go home tomorrow if we can provide transportation, does feel depressed REVIEW OF SYSTEMS:  Review of Systems  Constitutional: Negative for diaphoresis, fever, malaise/fatigue and weight loss.  HENT: Negative for ear discharge, ear pain, hearing loss, nosebleeds, sore throat and tinnitus.   Eyes: Negative for blurred vision and pain.  Respiratory: Negative for cough, hemoptysis, shortness of breath and wheezing.   Cardiovascular: Negative for chest pain, palpitations, orthopnea and leg swelling.  Gastrointestinal: Negative for abdominal pain, blood in stool, constipation, diarrhea, heartburn, nausea and vomiting.  Genitourinary: Negative for dysuria, frequency and urgency.  Musculoskeletal: Negative for back pain and myalgias.  Skin: Negative for itching and rash.  Neurological: Negative for dizziness, tingling, tremors, focal weakness, seizures, weakness and headaches.  Psychiatric/Behavioral: Positive for depression. The patient is not nervous/anxious.     DRUG ALLERGIES:   Allergies  Allergen Reactions  . Kiwi Extract Hives and Itching  . Morphine And Related Hives  . Penicillins Hives and Other (See Comments)    Has patient had a PCN reaction causing immediate rash, facial/tongue/throat swelling, SOB or lightheadedness with hypotension: Yes Has patient had a PCN reaction causing severe rash involving mucus membranes or skin necrosis: No Has patient had a PCN reaction that required hospitalization No Has patient had a PCN reaction occurring within the last 10 years: No If all  of the above answers are "NO", then may proceed with Cephalosporin use.  . Strawberry Extract Hives and Itching   VITALS:  Blood pressure 126/74, pulse 64, temperature 98 F (36.7 C), resp. rate 18, height 5\' 8"  (1.727 m), weight 72.6 kg, SpO2 98 %. PHYSICAL EXAMINATION:  Physical Exam HENT:     Head: Normocephalic and atraumatic.  Eyes:     Conjunctiva/sclera: Conjunctivae normal.     Pupils: Pupils are equal, round, and reactive to light.  Neck:     Musculoskeletal: Normal range of motion and neck supple.     Thyroid: No thyromegaly.     Trachea: No tracheal deviation.  Cardiovascular:     Rate and Rhythm: Normal rate and regular rhythm.     Heart sounds: Normal heart sounds.  Pulmonary:     Effort: Pulmonary effort is normal. No respiratory distress.     Breath sounds: Normal breath sounds. No wheezing.  Chest:     Chest wall: No tenderness.  Abdominal:     General: Bowel sounds are normal. There is no distension.     Palpations: Abdomen is soft.     Tenderness: There is no abdominal tenderness.  Musculoskeletal: Normal range of motion.  Skin:    General: Skin is warm and dry.     Findings: No rash.  Neurological:     Mental Status: She is alert and oriented to person, place, and time.     Cranial Nerves: No cranial nerve deficit.    LABORATORY PANEL:  Female CBC Recent Labs  Lab 07/07/19 0038  WBC 8.5  HGB 16.8*  HCT 50.3*  PLT 203   ------------------------------------------------------------------------------------------------------------------ Chemistries  Recent Labs  Lab 07/07/19 0339  NA 141  K 3.8  CL 106  CO2 28  GLUCOSE 84  BUN 14  CREATININE 0.62  CALCIUM 9.4  AST 16  ALT <5  ALKPHOS 77  BILITOT 0.4   RADIOLOGY:  No results found. ASSESSMENT AND PLAN:   Jacara Mackey a 63 y.o.femalewith medical history significant ofEtOH abuse, seizure disorder, non-compliance is being admitted for suspected asthma exacerbation   *Asthma/COPD exacerbation -monitor her on inhalers -Negative COVID test  *Seizure disorder -Continue antiepileptics  *Alcohol abuse -Monitor for signs of withdrawal and consider CIWA if needed  *Anxiety/major depressive disorder/insomnia:  Appreciate psych input -Started Celexa 10 mg daily -Started hydroxyzine 10 mg 3 times daily as needed -Started mirtazapine 7.5 mg at bedtime     All the records are reviewed and case discussed with Care Management/Social Worker. Management plans discussed with the patient, family (discussed with daughter over phone) and they are in agreement.  CODE STATUS: Full Code  TOTAL TIME TAKING CARE OF THIS PATIENT: 40 minutes.   More than 50% of the time was spent in counseling/coordination of care: YES  POSSIBLE D/C IN 1 DAYS, DEPENDING ON CLINICAL CONDITION.   Max Sane M.D on 07/08/2019 at 11:15 AM  Between 7am to 6pm - Pager - [phone]  After 6pm go to [email or web address] - Proofreader  [facility]  Office  [phone]  CC: Primary care physician; [name], MD  Note: This dictation was prepared with Dragon dictation along with smaller phrase technology. Any transcriptional errors that result from this process are unintentional.

## 2019-07-08 NOTE — Plan of Care (Signed)
  Problem: Education: Goal: Knowledge of General Education information will improve Description: Including pain rating scale, medication(s)/side effects and non-pharmacologic comfort measures Outcome: Progressing   Problem: Pain Managment: Goal: General experience of comfort will improve Outcome: Progressing   Problem: Safety: Goal: Ability to remain free from injury will improve Outcome: Progressing   Problem: Activity: Goal: Ability to tolerate increased activity will improve Outcome: Progressing   Problem: Respiratory: Goal: Ability to maintain a clear airway will improve Outcome: Progressing Goal: Levels of oxygenation will improve Outcome: Progressing Goal: Ability to maintain adequate ventilation will improve Outcome: Progressing

## 2019-07-08 NOTE — Progress Notes (Signed)
Nutrition Brief Note  RD received consult for assessment of nutrition requirements/status per COPD Gold consult  Wt Readings from Last 15 Encounters:  07/08/19 72.6 kg  05/21/18 68.4 kg  03/26/18 68.2 kg  05/02/17 67.6 kg  07/29/16 65.7 kg  06/25/16 61.7 kg  04/27/16 66.6 kg  11/02/15 59 kg  10/10/15 58.1 kg  08/25/15 58.1 kg  08/18/15 58.5 kg  03/02/15 61.1 kg  02/06/15 59.1 kg  01/18/15 65.8 kg  11/17/14 65.8 kg    Met with patient in room. She reports her appetite is good and unchanged from baseline. She is eating 90-100% of her meals here and reports she is tolerating them well. She reports good appetite and intake at baseline. She reports she is weight-stable. Per chart she is gaining weight. Patient with only mild muscle wasting at clavicle (no other muscle or subcutaneous fat wasting noted) on Nutrition-Focused Physical Exam. Patient does not meet criteria for malnutrition at this time.  Body mass index is 24.34 kg/m. Patient meets criteria for normal weight based on current BMI.   Current diet order is regular, patient is consuming approximately 90-100% of meals at this time. Labs and medications reviewed.   No nutrition interventions warranted at this time. If nutrition issues arise, please consult RD.   Willey Blade, MS, [place], LDN Office: [phone] Pager: [phone] After Hours/Weekend Pager: [phone]

## 2019-07-08 NOTE — Plan of Care (Signed)
  Problem: Education: Goal: Knowledge of General Education information will improve Description: Including pain rating scale, medication(s)/side effects and non-pharmacologic comfort measures Outcome: Progressing   Problem: Clinical Measurements: Goal: Respiratory complications will improve Outcome: Progressing   Problem: Activity: Goal: Risk for activity intolerance will decrease Outcome: Progressing   

## 2019-07-09 MED ORDER — MIRTAZAPINE 7.5 MG PO TABS: 7.5000 mg | ORAL_TABLET | Freq: Every day | ORAL | 0 refills | Status: DC

## 2019-07-09 MED ORDER — CITALOPRAM HYDROBROMIDE 10 MG PO TABS: 10.0000 mg | ORAL_TABLET | Freq: Every day | ORAL | 0 refills | Status: DC
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
## 2019-07-09 NOTE — Progress Notes (Signed)
Patient discharged this afternoon, awaiting on taxi service. BSC, and walker provided, follow up appointment information, prescription information, and medication regimen reviewed with patient who acknowledges understanding with teach back method. No concerns at this time.

## 2019-07-09 NOTE — Plan of Care (Signed)
  Problem: Education: Goal: Knowledge of General Education information will improve Description: Including pain rating scale, medication(s)/side effects and non-pharmacologic comfort measures Outcome: Adequate for Discharge   Problem: Clinical Measurements: Goal: Ability to maintain clinical measurements within normal limits will improve Outcome: Adequate for Discharge Goal: Will remain free from infection Outcome: Adequate for Discharge Goal: Diagnostic test results will improve Outcome: Adequate for Discharge Goal: Respiratory complications will improve Outcome: Adequate for Discharge Goal: Cardiovascular complication will be avoided Outcome: Adequate for Discharge   Problem: Health Behavior/Discharge Planning: Goal: Ability to manage health-related needs will improve Outcome: Adequate for Discharge   Problem: Respiratory: Goal: Ability to maintain a clear airway will improve Outcome: Adequate for Discharge Goal: Levels of oxygenation will improve Outcome: Adequate for Discharge Goal: Ability to maintain adequate ventilation will improve Outcome: Adequate for Discharge   Problem: Activity: Goal: Ability to tolerate increased activity will improve Outcome: Adequate for Discharge Goal: Will verbalize the importance of balancing activity with adequate rest periods Outcome: Adequate for Discharge   Problem: Skin Integrity: Goal: Risk for impaired skin integrity will decrease Outcome: Adequate for Discharge   Problem: Education: Goal: Knowledge of disease or condition will improve Outcome: Adequate for Discharge Goal: Knowledge of the prescribed therapeutic regimen will improve Outcome: Adequate for Discharge Goal: Individualized Educational Video(s) Outcome: Adequate for Discharge

## 2019-07-09 NOTE — Plan of Care (Signed)
  Problem: Education: Goal: Knowledge of disease or condition will improve 07/09/2019 1119 by Alen Blew, RN Outcome: Adequate for Discharge 07/09/2019 1118 by Alen Blew, RN Outcome: Adequate for Discharge Goal: Knowledge of the prescribed therapeutic regimen will improve 07/09/2019 1119 by Alen Blew, RN Outcome: Adequate for Discharge 07/09/2019 1118 by Alen Blew, RN Outcome: Adequate for Discharge Goal: Individualized Educational Video(s) 07/09/2019 1119 by Alen Blew, RN Outcome: Adequate for Discharge 07/09/2019 1118 by Alen Blew, RN Outcome: Adequate for Discharge

## 2019-07-09 NOTE — TOC Transition Note (Signed)
Transition of Care [facility]) - CM/SW Discharge Note   Patient Details  Name: Lindsey [name] MRN: [id number] Date of Birth: September 04, 1956  [facility] CM/SW Contact:  Elza Rafter, RN Phone Number: 07/09/2019, 9:41 AM   Clinical Narrative:   Patient is home home- lives with daughter in [place] but has been staying with a friend in [place] at [address] back to friends house today via taxi and then daughter will pick her up tomorrow and take her back to [place].  States she does not have any transportation because everyone is at work and will not get off until late tonight.  Current with Dr. Brunetta Genera for PCP obtains medications at [facility] she does not have to pay there.  Attempting to set up home health PT & OT.  Referrals declined by [facility] and [facility] as they do not take Medicaid.    [facility] has accepted patient.  Asked MD for orders for PT, OT.  Rolling Walker and BSC will be delivered to room.  Taxi voucher on chart.    Final next level of care: [place] Barriers to Discharge: No Barriers Identified   Patient Goals and CMS Choice Patient states their goals for this hospitalization and ongoing recovery are:: would like home health. CMS Medicare.gov Compare Post Acute Care list provided to:: Patient Choice offered to / list presented to : Patient  Discharge Placement                       Discharge Plan and Services   Discharge Planning Services: CM Consult Post Acute Care Choice: Home Health          DME Arranged: Bedside commode, [place] DME Agency: [employer] Date DME Agency Contacted: 07/09/19 Time DME Agency Contacted: [phone] Representative spoke with at DME Agency: [place] Determinants of Health ([place]) Interventions     Readmission Risk Interventions Readmission Risk Prevention Plan 07/09/2019  Post Dischage Appt Complete  Medication Screening  Complete  Transportation Screening Complete  Some recent data might be hidden
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
## 2019-07-09 NOTE — Discharge Instructions (Signed)
Depression (Active)    Start with erect posture. Lower shoulders. Hold ____ seconds. Repeat ____ times. Do ____ sessions per day.  Copyright  VHI. All rights reserved.

## 2019-07-10 NOTE — Discharge Summary (Signed)
[facility] [place] NAME: Lindsey [name]    MR#:  [id number]  DATE OF BIRTH:  Jun 24, 1956  DATE OF ADMISSION:  07/06/2019   ADMITTING PHYSICIAN: Max Sane, MD  DATE OF DISCHARGE: 07/09/2019  4:30 PM  PRIMARY CARE PHYSICIAN: [name], MD   ADMISSION DIAGNOSIS:  Generalized abdominal pain [R10.84] Elevated troponin level [R77.8] Dyspnea, unspecified type [R06.00] DISCHARGE DIAGNOSIS:  Active Problems:   COPD exacerbation (HCC)   Major depressive disorder, recurrent episode, mild ([place])  SECONDARY DIAGNOSIS:   Past Medical History:  Diagnosis Date  . Asthma   . H/O ETOH abuse   . Seizure [facility])    HOSPITAL COURSE:  Lindsey [name] a 63y.o.femalewith medical history significant ofEtOH abuse, seizure disorder, non-complianceis being admitted for suspected asthma exacerbation  *Asthma/COPD exacerbation -stable on inhalers -NegativeCOVIDtest  *Seizure disorder - none in the Hospital -Continue antiepileptics  *Alcohol abuse -counseled  *Anxiety/major depressive disorder/insomnia: Appreciate psych input -Started Celexa 10 mg daily -Started hydroxyzine 10 mg 3 times daily as needed -Started mirtazapine 7.5 mg at bedtime DISCHARGE CONDITIONS:  stable CONSULTS OBTAINED:   DRUG ALLERGIES:   Allergies  Allergen Reactions  . Kiwi Extract Hives and Itching  . Morphine And Related Hives  . Penicillins Hives and Other (See Comments)    Has patient had a PCN reaction causing immediate rash, facial/tongue/throat swelling, SOB or lightheadedness with hypotension: Yes Has patient had a PCN reaction causing severe rash involving mucus membranes or skin necrosis: No Has patient had a PCN reaction that required hospitalization No Has patient had a PCN reaction occurring within the last 10 years: No If all of the above answers are "NO", then may proceed with Cephalosporin use.  . Strawberry Extract Hives and  Itching   DISCHARGE MEDICATIONS:   Allergies as of 07/09/2019      Reactions   Kiwi Extract Hives, Itching   Morphine And Related Hives   Penicillins Hives, Other (See Comments)   Has patient had a PCN reaction causing immediate rash, facial/tongue/throat swelling, SOB or lightheadedness with hypotension: Yes Has patient had a PCN reaction causing severe rash involving mucus membranes or skin necrosis: No Has patient had a PCN reaction that required hospitalization No Has patient had a PCN reaction occurring within the last 10 years: No If all of the above answers are "NO", then may proceed with Cephalosporin use.   Strawberry Extract Hives, Itching      Medication List    STOP taking these medications   amLODipine 10 MG tablet Commonly known as: NORVASC   calcium-vitamin D 250-100 MG-UNIT tablet     TAKE these medications   acetaminophen 500 MG tablet Commonly known as: TYLENOL Take 500-1,000 mg by mouth every 8 (eight) hours as needed for mild pain.   aspirin EC 81 MG tablet Take 81 mg by mouth daily.   citalopram 10 MG tablet Commonly known as: CELEXA Take 1 tablet (10 mg total) by mouth at bedtime.   divalproex 500 MG DR tablet Commonly known as: Depakote Take 1 tablet (500 mg total) by mouth 2 (two) times daily.   fluticasone 220 MCG/ACT inhaler Commonly known as: FLOVENT HFA Inhale 1-2 puffs into the lungs 2 (two) times daily.   hydrOXYzine 25 MG tablet Commonly known as: ATARAX/VISTARIL Take 25 mg by mouth 3 (three) times daily as needed for anxiety or itching.   levETIRAcetam 750 MG tablet Commonly known as: KEPPRA Take 1 tablet (750 mg total) by  mouth 2 (two) times daily.   meloxicam 7.5 MG tablet Commonly known as: MOBIC Take 7.5 mg by mouth 2 (two) times daily.   mirtazapine 7.5 MG tablet Commonly known as: REMERON Take 1 tablet (7.5 mg total) by mouth at bedtime.   multivitamin tablet Take 1 tablet by mouth daily.   pantoprazole 40 MG  tablet Commonly known as: PROTONIX Take 40 mg by mouth daily.   ProAir HFA 108 (90 Base) MCG/ACT inhaler Generic drug: albuterol Inhale 2 puffs into the lungs every 6 (six) hours as needed for wheezing or shortness of breath.   Stiolto Respimat 2.5-2.5 MCG/ACT Aers Generic drug: Tiotropium Bromide-Olodaterol Inhale 2 puffs into the lungs daily.   Vitamin D (Ergocalciferol) 1.25 MG (50000 UT) Caps capsule Commonly known as: DRISDOL Take 50,000 Units by mouth every 7 (seven) days.        DISCHARGE INSTRUCTIONS:   DIET:  Regular diet DISCHARGE CONDITION:  Good ACTIVITY:  Activity as tolerated OXYGEN:  Home Oxygen: No.  Oxygen Delivery: room air DISCHARGE LOCATION:  home   If you experience worsening of your admission symptoms, develop shortness of breath, life threatening emergency, suicidal or homicidal thoughts you must seek medical attention immediately by calling 911 or calling your MD immediately  if symptoms less severe.  You Must read complete instructions/literature along with all the possible adverse reactions/side effects for all the Medicines you take and that have been prescribed to you. Take any new Medicines after you have completely understood and accpet all the possible adverse reactions/side effects.   Please note  You were cared for by a hospitalist during your hospital stay. If you have any questions about your discharge medications or the care you received while you were in the hospital after you are discharged, you can call the unit and asked to speak with the hospitalist on call if the hospitalist that took care of you is not available. Once you are discharged, your primary care physician will handle any further medical issues. Please note that NO REFILLS for any discharge medications will be authorized once you are discharged, as it is imperative that you return to your primary care physician (or establish a relationship with a primary care physician if you  do not have one) for your aftercare needs so that they can reassess your need for medications and monitor your lab values.    On the day of Discharge:  VITAL SIGNS:  Blood pressure (!) 119/58, pulse 67, temperature 97.9 F (36.6 C), temperature source Oral, resp. rate 18, height 5\' 8"  (1.727 m), weight 74.3 kg, SpO2 100 %. PHYSICAL EXAMINATION:  GENERAL:  63 y.o.-year-old patient lying in the bed with no acute distress.  EYES: Pupils equal, round, reactive to light and accommodation. No scleral icterus. Extraocular muscles intact.  HEENT: Head atraumatic, normocephalic. Oropharynx and nasopharynx clear.  NECK:  Supple, no jugular venous distention. No thyroid enlargement, no tenderness.  LUNGS: Normal breath sounds bilaterally, no wheezing, rales,rhonchi or crepitation. No use of accessory muscles of respiration.  CARDIOVASCULAR: S1, S2 normal. No murmurs, rubs, or gallops.  ABDOMEN: Soft, non-tender, non-distended. Bowel sounds present. No organomegaly or mass.  EXTREMITIES: No pedal edema, cyanosis, or clubbing.  NEUROLOGIC: Cranial nerves II through XII are intact. Muscle strength 5/5 in all extremities. Sensation intact. Gait not checked.  PSYCHIATRIC: The patient is alert and oriented x 3.  SKIN: No obvious rash, lesion, or ulcer.  DATA REVIEW:   CBC Recent Labs  Lab 07/07/19 0038  WBC  8.5  HGB 16.8*  HCT 50.3*  PLT 203    Chemistries  Recent Labs  Lab 07/07/19 0339  NA 141  K 3.8  CL 106  CO2 28  GLUCOSE 84  BUN 14  CREATININE 0.62  CALCIUM 9.4  AST 16  ALT <5  ALKPHOS 77  BILITOT 0.4     Follow-up Information    [name], MD. Go on 07/10/2019.   Specialty: Family Medicine Why: APPOINTMENT AT 10AM Contact information: [place] [address] [phone]           Management plans discussed with the patient, family and they are in agreement.  CODE STATUS: Prior   TOTAL TIME TAKING CARE OF THIS PATIENT: 45 minutes.     Max Sane M.D on 07/10/2019 at 8:57 PM  Between 7am to 6pm - Pager - [phone]  After 6pm go to [email or web address] - Proofreader  [facility]  Office  [phone]  CC: Primary care physician; [name], MD   Note: This dictation was prepared with Dragon dictation along with smaller phrase technology. Any transcriptional errors that result from this process are unintentional.

## 2019-07-16 ENCOUNTER — Other Ambulatory Visit: Payer: Self-pay

## 2019-07-16 ENCOUNTER — Emergency Department (HOSPITAL_COMMUNITY)
Admission: EM | Admit: 2019-07-16 | Discharge: 2019-07-16 | Disposition: A | Discharge status: Home / Self Care | Payer: Medicaid Other | Attending: Emergency Medicine | Admitting: Emergency Medicine

## 2019-07-16 DIAGNOSIS — Z79899 Other long term (current) drug therapy: Secondary | ICD-10-CM | POA: Diagnosis not present

## 2019-07-16 DIAGNOSIS — J449 Chronic obstructive pulmonary disease, unspecified: Secondary | ICD-10-CM | POA: Insufficient documentation

## 2019-07-16 DIAGNOSIS — R569 Unspecified convulsions: Secondary | ICD-10-CM | POA: Insufficient documentation

## 2019-07-16 DIAGNOSIS — F1210 Cannabis abuse, uncomplicated: Secondary | ICD-10-CM | POA: Diagnosis not present

## 2019-07-16 DIAGNOSIS — F17210 Nicotine dependence, cigarettes, uncomplicated: Secondary | ICD-10-CM | POA: Insufficient documentation

## 2019-07-16 DIAGNOSIS — I10 Essential (primary) hypertension: Secondary | ICD-10-CM | POA: Insufficient documentation

## 2019-07-16 DIAGNOSIS — Z7982 Long term (current) use of aspirin: Secondary | ICD-10-CM | POA: Diagnosis not present

## 2019-07-16 LAB — CBC WITH DIFFERENTIAL/PLATELET
Abs Immature Granulocytes: 0.03 10*3/uL (ref 0.00–0.07)
Basophils Absolute: 0 10*3/uL (ref 0.0–0.1)
Basophils Relative: 0 %
Eosinophils Absolute: 0.1 10*3/uL (ref 0.0–0.5)
Eosinophils Relative: 1 %
HCT: 40.2 % (ref 36.0–46.0)
Hemoglobin: 13.3 g/dL (ref 12.0–15.0)
Immature Granulocytes: 0 %
Lymphocytes Relative: 24 %
Lymphs Abs: 2.7 10*3/uL (ref 0.7–4.0)
MCH: 29 pg (ref 26.0–34.0)
MCHC: 33.1 g/dL (ref 30.0–36.0)
MCV: 87.6 fL (ref 80.0–100.0)
Monocytes Absolute: 1.1 10*3/uL — ABNORMAL HIGH (ref 0.1–1.0)
Monocytes Relative: 10 %
Neutro Abs: 7.3 10*3/uL (ref 1.7–7.7)
Neutrophils Relative %: 65 %
Platelets: 430 10*3/uL — ABNORMAL HIGH (ref 150–400)
RBC: 4.59 MIL/uL (ref 3.87–5.11)
RDW: 13.3 % (ref 11.5–15.5)
WBC: 11.3 10*3/uL — ABNORMAL HIGH (ref 4.0–10.5)
nRBC: 0 % (ref 0.0–0.2)

## 2019-07-16 LAB — BASIC METABOLIC PANEL
Anion gap: 9 (ref 5–15)
BUN: 15 mg/dL (ref 8–23)
CO2: 25 mmol/L (ref 22–32)
Calcium: 10.2 mg/dL (ref 8.9–10.3)
Chloride: 103 mmol/L (ref 98–111)
Creatinine, Ser: 1.01 mg/dL — ABNORMAL HIGH (ref 0.44–1.00)
GFR calc Af Amer: >60 mL/min (ref >60)
GFR calc non Af Amer: 60 mL/min — ABNORMAL LOW (ref >60)
Glucose, Bld: 110 mg/dL — ABNORMAL HIGH (ref 70–99)
Potassium: 3.5 mmol/L (ref 3.5–5.1)
Sodium: 137 mmol/L (ref 135–145)

## 2019-07-16 LAB — VALPROIC ACID LEVEL: Valproic Acid Lvl: <10 ug/mL — ABNORMAL LOW (ref 50.0–100.0)

## 2019-07-16 LAB — URINALYSIS, ROUTINE W REFLEX MICROSCOPIC
Bacteria, UA: NONE SEEN
Bilirubin Urine: NEGATIVE
Glucose, UA: NEGATIVE mg/dL
Ketones, ur: NEGATIVE mg/dL
Leukocytes,Ua: NEGATIVE
Nitrite: NEGATIVE
Protein, ur: NEGATIVE mg/dL
Specific Gravity, Urine: 1.003 — ABNORMAL LOW (ref 1.005–1.030)
pH: 6 (ref 5.0–8.0)

## 2019-07-16 MED ORDER — LACTATED RINGERS IV BOLUS
1000.0000 mL | Freq: Once | INTRAVENOUS | Status: AC
  Administered 2019-07-16: 1000 mL via INTRAVENOUS

## 2019-07-16 MED ORDER — DIVALPROEX SODIUM 500 MG PO DR TAB
500.0000 mg | DELAYED_RELEASE_TABLET | Freq: Once | ORAL | Status: AC
  Administered 2019-07-16: 07:00:00 500 mg via ORAL
  Filled 2019-07-16: qty 1

## 2019-07-16 MED ORDER — LEVETIRACETAM 500 MG PO TABS
1000.0000 mg | ORAL_TABLET | Freq: Once | ORAL | Status: AC
  Administered 2019-07-16: 1000 mg via ORAL
  Filled 2019-07-16: qty 2

## 2019-07-16 MED ORDER — LEVETIRACETAM IN NACL 1000 MG/100ML IV SOLN
1000.0000 mg | Freq: Once | INTRAVENOUS | Status: AC
  Administered 2019-07-16: 1000 mg via INTRAVENOUS
  Filled 2019-07-16: qty 100

## 2019-07-16 NOTE — ED Triage Notes (Addendum)
BIB EMS from home. Witnessed sezuire by family  2 hours ago. Pt reports hallucinations.  Hx of seizures and schizophernia has not been complaint with medications for both.  Ems reports home medications of lithium and depakote   160/80 HR 88 16 99% RA CBG 125

## 2019-07-16 NOTE — ED Provider Notes (Signed)
[facility] DEPT Provider Note   CSN: [id number] Arrival date & time: 07/16/19  0436     History   Chief Complaint Chief Complaint  Patient presents with   Seizures    HPI Lindsey [name] is a 63 y.o. female.      Seizures Seizure activity on arrival: no   Seizure type:  Unable to specify Initial focality:  None Episode characteristics: abnormal movements   Postictal symptoms: no confusion   Return to baseline: yes   Severity:  Mild Timing:  Once Progression:  Resolved   Past Medical History:  Diagnosis Date   Asthma    H/O ETOH abuse    Seizure [facility])     Patient Active Problem List   Diagnosis Date Noted   COPD exacerbation ([place]) 07/07/2019   Major depressive disorder, recurrent episode, mild ([place]) 07/07/2019   Anemia    Alcohol withdrawal delirium ([place]) 03/26/2018   Seizures ([place]) 06/26/2016   Patient's noncompliance with other medical treatment and regimen 06/26/2016   Leukocytosis 06/26/2016   Tobacco abuse 06/26/2016   Alcohol withdrawal seizure ([place]) 06/25/2016   Psychosis ([place]) 11/04/2015   Essential hypertension 11/03/2015   Seizures, 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   Asthma 10/10/2015   Seizure disorder ([place]) 08/15/2015   Status epilepticus ([place]) 08/15/2015   Post-ictal state ([place]) 03/01/2015   Seizure secondary to subtherapeutic anticonvulsant medication ([place]) 02/06/2015   Recurrent seizures ([place]) 02/06/2015   Acute encephalopathy    Polysubstance abuse ([place])    Alcohol dependence with withdrawal with complication ([place]) 123456   Protein-calorie malnutrition, severe ([place]) 03/04/2014   Encephalopathy 03/04/2014   Alcohol abuse 11/06/2013   Alcohol withdrawal ([place]) 11/06/2013   Marijuana abuse 11/06/2013   Seizure ([place]) 11/05/2013   Altered mental status 11/05/2013    Past Surgical History:  Procedure Laterality Date   ABDOMINAL HYSTERECTOMY     GALLBLADDER  SURGERY       OB History   No obstetric history on file.      Home Medications    Prior to Admission medications   Medication Sig Start Date End Date Taking? Authorizing Provider  acetaminophen (TYLENOL) 500 MG tablet Take 500-1,000 mg by mouth every 8 (eight) hours as needed for mild pain.     [provider]  albuterol (PROAIR HFA) 108 (90 Base) MCG/ACT inhaler Inhale 2 puffs into the lungs every 6 (six) hours as needed for wheezing or shortness of breath.    [provider]  aspirin EC 81 MG tablet Take 81 mg by mouth daily.    [provider]  citalopram (CELEXA) 10 MG tablet Take 1 tablet (10 mg total) by mouth at bedtime. 07/09/19   Max Sane, MD  divalproex (DEPAKOTE) 500 MG DR tablet Take 1 tablet (500 mg total) by mouth 2 (two) times daily. 05/22/18   Patrecia Pour, MD  fluticasone (FLOVENT HFA) 220 MCG/ACT inhaler Inhale 1-2 puffs into the lungs 2 (two) times daily.    [provider]  hydrOXYzine (ATARAX/VISTARIL) 25 MG tablet Take 25 mg by mouth 3 (three) times daily as needed for anxiety or itching.    [provider]  levETIRAcetam (KEPPRA) 750 MG tablet Take 1 tablet (750 mg total) by mouth 2 (two) times daily. 05/22/18   Patrecia Pour, MD  meloxicam (MOBIC) 7.5 MG tablet Take 7.5 mg by mouth 2 (two) times daily.    [provider]  mirtazapine (REMERON) 7.5 MG tablet Take 1 tablet (7.5 mg total)  by mouth at bedtime. 07/09/19   Max Sane, MD  Multiple Vitamin (MULTIVITAMIN) tablet Take 1 tablet by mouth daily.    [provider]  pantoprazole (PROTONIX) 40 MG tablet Take 40 mg by mouth daily.    [provider]  Tiotropium Bromide-Olodaterol (STIOLTO RESPIMAT) 2.5-2.5 MCG/ACT AERS Inhale 2 puffs into the lungs daily.    [provider]  Vitamin D, Ergocalciferol, (DRISDOL) 1.25 MG (50000 UT) CAPS capsule Take 50,000 Units by mouth every 7 (seven) days.    [provider]    Family  History Family History  Problem Relation Age of Onset   Seizures Mother    Alcohol abuse Mother    Heart attack Father    Drug abuse Brother    Cancer Brother        Pancreatic    Social History Social History   Tobacco Use   Smoking status: Current Some Day Smoker    Types: Cigarettes   Smokeless tobacco: Never Used  Substance Use Topics   Alcohol use: No    Comment: Hx of alchol abuse, not currently drinking   Drug use: Yes    Types: Marijuana    Comment: "every now and then"     Allergies   Kiwi extract, Morphine and related, Penicillins, and Strawberry extract   Review of Systems Review of Systems  Neurological: Positive for seizures.  All other systems reviewed and are negative.    Physical Exam Updated Vital Signs BP 139/82    Pulse 89    Temp 98.2 F (36.8 C) (Oral)    Resp 18    SpO2 99%   Physical Exam Vitals signs and nursing note reviewed.  Constitutional:      Appearance: She is well-developed.  HENT:     Head: Normocephalic and atraumatic.     Mouth/Throat:     Mouth: Mucous membranes are dry.     Pharynx: Oropharynx is clear.  Neck:     Musculoskeletal: Normal range of motion.  Cardiovascular:     Rate and Rhythm: Normal rate and regular rhythm.  Pulmonary:     Effort: No respiratory distress.     Breath sounds: No stridor.  Abdominal:     General: There is no distension.  Musculoskeletal: Normal range of motion.        General: No swelling or tenderness.  Skin:    General: Skin is warm and dry.  Neurological:     General: No focal deficit present.     Mental Status: She is alert.      ED Treatments / Results  Labs (all labs ordered are listed, but only abnormal results are displayed) Labs Reviewed  CBC WITH DIFFERENTIAL/PLATELET - Abnormal; Notable for the following components:      Result Value   WBC 11.3 (*)    Platelets 430 (*)    Monocytes Absolute 1.1 (*)    All other components within normal limits  BASIC  METABOLIC PANEL - Abnormal; Notable for the following components:   Glucose, Bld 110 (*)    Creatinine, Ser 1.01 (*)    GFR calc non Af Amer 60 (*)    All other components within normal limits  URINALYSIS, ROUTINE W REFLEX MICROSCOPIC - Abnormal; Notable for the following components:   Color, Urine STRAW (*)    Specific Gravity, Urine 1.003 (*)    Hgb urine dipstick SMALL (*)    All other components within normal limits  VALPROIC ACID LEVEL - Abnormal; Notable  for the following components:   Valproic Acid Lvl <10 (*)    All other components within normal limits  LEVETIRACETAM LEVEL    EKG None  Radiology No results found.  Procedures Procedures (including critical care time)  Medications Ordered in ED Medications  levETIRAcetam (KEPPRA) IVPB 1000 mg/100 mL premix (1,000 mg Intravenous New Bag/Given 07/16/19 0636)  divalproex (DEPAKOTE) DR tablet 500 mg (has no administration in time range)  lactated ringers bolus 1,000 mL (0 mLs Intravenous Stopped 07/16/19 [id number])     Initial Impression / Assessment and Plan / ED Course  I have reviewed the triage vital signs and the nursing notes.  Pertinent labs & imaging results that were available during my care of the patient were reviewed by me and considered in my medical decision making (see chart for details).  Breakthrough seizure. eval for same.  Patient states stable here.  Initially was limited tachycardic but improved with fluids.  Suspect dehydration with her AKI.  Her Depakote level is less than 10 make me think maybe she not taking it.  Keppra level still pending.  Dosed both.  No indication for admission as she seems to be at baseline.  Patient stable for discharge at this time.  Final Clinical Impressions(s) / ED Diagnoses   Final diagnoses:  Seizure [facility])    ED Discharge Orders    None       Merelyn Klump, Corene Cornea, MD 07/16/19 [phone]

## 2019-07-18 LAB — LEVETIRACETAM LEVEL: Levetiracetam Lvl: <1 ug/mL — ABNORMAL LOW (ref 10.0–40.0)

## 2019-07-20 ENCOUNTER — Other Ambulatory Visit: Payer: Self-pay

## 2019-07-20 ENCOUNTER — Emergency Department (HOSPITAL_COMMUNITY)
Admission: EM | Admit: 2019-07-20 | Discharge: 2019-07-20 | Disposition: A | Discharge status: Home / Self Care | Payer: Medicaid Other | Attending: Emergency Medicine | Admitting: Emergency Medicine

## 2019-07-20 ENCOUNTER — Emergency Department (HOSPITAL_COMMUNITY): Payer: Medicaid Other

## 2019-07-20 DIAGNOSIS — Z79899 Other long term (current) drug therapy: Secondary | ICD-10-CM | POA: Insufficient documentation

## 2019-07-20 DIAGNOSIS — J449 Chronic obstructive pulmonary disease, unspecified: Secondary | ICD-10-CM | POA: Diagnosis not present

## 2019-07-20 DIAGNOSIS — R569 Unspecified convulsions: Secondary | ICD-10-CM | POA: Insufficient documentation

## 2019-07-20 DIAGNOSIS — M25561 Pain in right knee: Secondary | ICD-10-CM | POA: Insufficient documentation

## 2019-07-20 DIAGNOSIS — Z7982 Long term (current) use of aspirin: Secondary | ICD-10-CM | POA: Insufficient documentation

## 2019-07-20 DIAGNOSIS — F17210 Nicotine dependence, cigarettes, uncomplicated: Secondary | ICD-10-CM | POA: Insufficient documentation

## 2019-07-20 DIAGNOSIS — Z9114 Patient's other noncompliance with medication regimen: Secondary | ICD-10-CM | POA: Insufficient documentation

## 2019-07-20 DIAGNOSIS — I10 Essential (primary) hypertension: Secondary | ICD-10-CM | POA: Diagnosis not present

## 2019-07-20 MED ORDER — LORAZEPAM 2 MG/ML IJ SOLN: 1.0000 mg | Freq: Once | INTRAMUSCULAR | Status: DC

## 2019-07-20 MED ORDER — LEVETIRACETAM IN NACL 1000 MG/100ML IV SOLN: 1000.0000 mg | Freq: Once | INTRAVENOUS | Status: DC

## 2019-07-20 MED ORDER — DIVALPROEX SODIUM ER 500 MG PO TB24
1000.0000 mg | ORAL_TABLET | Freq: Every day | ORAL | Status: DC
  Administered 2019-07-20: 19:00:00 1000 mg via ORAL
  Filled 2019-07-20 (×2): qty 2

## 2019-07-20 MED ORDER — DIVALPROEX SODIUM 500 MG PO DR TAB: 500.0000 mg | DELAYED_RELEASE_TABLET | Freq: Two times a day (BID) | ORAL | 0 refills | Status: DC

## 2019-07-20 MED ORDER — LEVETIRACETAM 750 MG PO TABS: 750.0000 mg | ORAL_TABLET | Freq: Two times a day (BID) | ORAL | 0 refills | Status: DC

## 2019-07-20 MED ORDER — LORAZEPAM 1 MG PO TABS
1.0000 mg | ORAL_TABLET | Freq: Once | ORAL | Status: AC
  Administered 2019-07-20: 19:00:00 1 mg via ORAL
  Filled 2019-07-20: qty 1

## 2019-07-20 MED ORDER — LEVETIRACETAM 500 MG PO TABS
1000.0000 mg | ORAL_TABLET | Freq: Once | ORAL | Status: AC
  Administered 2019-07-20: 1000 mg via ORAL
  Filled 2019-07-20: qty 2

## 2019-07-20 MED ORDER — DIVALPROEX SODIUM ER 500 MG PO TB24
1000.0000 mg | ORAL_TABLET | Freq: Once | ORAL | Status: DC
  Filled 2019-07-20: qty 2

## 2019-07-20 NOTE — ED Notes (Signed)
Lindsey [name] (daughter) [phone]

## 2019-07-20 NOTE — ED Triage Notes (Signed)
BIB EMS from home. Daughter reported to EMS that pt had 4 "mini seizures" stating her eyes were fixed and not responding. Pt is now alert and oriented. C/o pain to right thigh and knee, states she fell during seizure.

## 2019-07-20 NOTE — ED Provider Notes (Signed)
[facility] EMERGENCY DEPARTMENT Provider Note   CSN: [id number] Arrival date & time: 07/20/19  1546     History   Chief Complaint Chief Complaint  Patient presents with  . Seizures    HPI Lindsey [name] is a 63 y.o. female.     HPI   63 year old female with seizures.  History of the same.  Noncompliant with medications.  She is on Depakote and Keppra.  She is seen in the emergency room 4 days ago.  Valproic acid level was subtherapeutic and subsequent Keppra level subtherapeutic as well.  Of note, her last 5 valproic acid levels over the past year and a half of all been subtherapeutic.  She reports some right knee pain after falling with one of her seizures.  She can bear weight on it.  Denies any other acute complaints otherwise.  Past Medical History:  Diagnosis Date  . Asthma   . H/O ETOH abuse   . Seizure [facility])     Patient Active Problem List   Diagnosis Date Noted  . COPD exacerbation ([place]) 07/07/2019  . Major depressive disorder, recurrent episode, mild ([place]) 07/07/2019  . Anemia   . Alcohol withdrawal delirium ([place]) 03/26/2018  . Seizures ([place]) 06/26/2016  . Patient's noncompliance with other medical treatment and regimen 06/26/2016  . Leukocytosis 06/26/2016  . Tobacco abuse 06/26/2016  . Alcohol withdrawal seizure ([place]) 06/25/2016  . Psychosis ([place]) 11/04/2015  . Essential hypertension 11/03/2015  . 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 Asthma 10/10/2015  . Seizure disorder ([place]) 08/15/2015  . Status epilepticus ([place]) 08/15/2015  . Post-ictal state ([place]) 03/01/2015  . Seizure secondary to subtherapeutic anticonvulsant medication ([place]) 02/06/2015  . Recurrent seizures ([place]) 02/06/2015  . Acute encephalopathy   . Polysubstance abuse ([place])   . Alcohol dependence with withdrawal with complication ([place]) 123456  . Protein-calorie malnutrition, severe ([place]) 03/04/2014  . Encephalopathy 03/04/2014  . Alcohol abuse  11/06/2013  . Alcohol withdrawal ([place]) 11/06/2013  . Marijuana abuse 11/06/2013  . Seizure ([place]) 11/05/2013  . Altered mental status 11/05/2013    Past Surgical History:  Procedure Laterality Date  . ABDOMINAL HYSTERECTOMY    . GALLBLADDER SURGERY       OB History   No obstetric history on file.      Home Medications    Prior to Admission medications   Medication Sig Start Date End Date Taking? Authorizing Provider  acetaminophen (TYLENOL) 500 MG tablet Take 500-1,000 mg by mouth every 8 (eight) hours as needed for mild pain.     [provider]  albuterol (PROAIR HFA) 108 (90 Base) MCG/ACT inhaler Inhale 2 puffs into the lungs every 6 (six) hours as needed for wheezing or shortness of breath.    [provider]  aspirin EC 81 MG tablet Take 81 mg by mouth daily.    [provider]  citalopram (CELEXA) 10 MG tablet Take 1 tablet (10 mg total) by mouth at bedtime. 07/09/19   Max Sane, MD  divalproex (DEPAKOTE) 500 MG DR tablet Take 1 tablet (500 mg total) by mouth 2 (two) times daily. 05/22/18   Patrecia Pour, MD  fluticasone (FLOVENT HFA) 220 MCG/ACT inhaler Inhale 1-2 puffs into the lungs 2 (two) times daily.    [provider]  hydrOXYzine (ATARAX/VISTARIL) 25 MG tablet Take 25 mg by mouth 3 (three) times daily as needed for anxiety or itching.    [provider]  levETIRAcetam (KEPPRA) 750 MG tablet Take 1 tablet (  750 mg total) by mouth 2 (two) times daily. 05/22/18   Patrecia Pour, MD  meloxicam (MOBIC) 7.5 MG tablet Take 7.5 mg by mouth 2 (two) times daily.    [provider]  mirtazapine (REMERON) 7.5 MG tablet Take 1 tablet (7.5 mg total) by mouth at bedtime. 07/09/19   Max Sane, MD  Multiple Vitamin (MULTIVITAMIN) tablet Take 1 tablet by mouth daily.    [provider]  pantoprazole (PROTONIX) 40 MG tablet Take 40 mg by mouth daily.    [provider]  Tiotropium Bromide-Olodaterol (STIOLTO  RESPIMAT) 2.5-2.5 MCG/ACT AERS Inhale 2 puffs into the lungs daily.    [provider]  Vitamin D, Ergocalciferol, (DRISDOL) 1.25 MG (50000 UT) CAPS capsule Take 50,000 Units by mouth every 7 (seven) days.    [provider]    Family History Family History  Problem Relation Age of Onset  . Seizures Mother   . Alcohol abuse Mother   . Heart attack Father   . Drug abuse Brother   . Cancer Brother        Pancreatic    Social History Social History   Tobacco Use  . Smoking status: Current Some Day Smoker    Types: Cigarettes  . Smokeless tobacco: Never Used  Substance Use Topics  . Alcohol use: No    Comment: Hx of alchol abuse, not currently drinking  . Drug use: Yes    Types: Marijuana    Comment: "every now and then"     Allergies   Kiwi extract, Morphine and related, Penicillins, and Strawberry extract   Review of Systems Review of Systems All systems reviewed and negative, other than as noted in HPI.   Physical Exam Updated Vital Signs BP (!) 149/87   Pulse 81   Temp 98.8 F (37.1 C) (Oral)   Resp 16   Ht 5\' 8"  (1.727 m)   Wt 74.3 kg   SpO2 99%   BMI 24.91 kg/m   Physical Exam Vitals signs and nursing note reviewed.  Constitutional:      General: She is not in acute distress.    Appearance: She is well-developed.  HENT:     Head: Normocephalic and atraumatic.  Eyes:     General:        Right eye: No discharge.        Left eye: No discharge.     Conjunctiva/sclera: Conjunctivae normal.  Neck:     Musculoskeletal: Neck supple. No neck rigidity.  Cardiovascular:     Rate and Rhythm: Normal rate and regular rhythm.     Heart sounds: Normal heart sounds. No murmur. No friction rub. No gallop.   Pulmonary:     Effort: Pulmonary effort is normal. No respiratory distress.     Breath sounds: Normal breath sounds.  Abdominal:     General: There is no distension.     Palpations: Abdomen is soft.     Tenderness: There is no abdominal  tenderness.  Musculoskeletal:        General: Tenderness present.     Comments: Surgical scar R knee. No obvious deformity or effusion. TTP anterior. Able to actively range. NVI.   Skin:    General: Skin is warm and dry.  Neurological:     Mental Status: She is alert and oriented to person, place, and time.     Cranial Nerves: No cranial nerve deficit.     Sensory: Sensory deficit present.     Motor: No  weakness.  Psychiatric:        Behavior: Behavior normal.        Thought Content: Thought content normal.      ED Treatments / Results  Labs (all labs ordered are listed, but only abnormal results are displayed) Labs Reviewed - No data to display  EKG None  Radiology No results found.  Procedures Procedures (including critical care time)  Medications Ordered in ED Medications  levETIRAcetam (KEPPRA) IVPB 1000 mg/100 mL premix (has no administration in time range)  divalproex (DEPAKOTE ER) 24 hr tablet 1,000 mg (has no administration in time range)  LORazepam (ATIVAN) injection 1 mg (has no administration in time range)     Initial Impression / Assessment and Plan / ED Course  I have reviewed the triage vital signs and the nursing notes.  Pertinent labs & imaging results that were available during my care of the patient were reviewed by me and considered in my medical decision making (see chart for details).        63 year old female with seizures.  History of the same.  Now back to baseline.  Noncompliant with her medications.  Valproic acid and Keppra levels 4 days ago were essentially undetectable.  She was dosed in the emergency room then but has not taken her medicines since that time.  Little utility in repeating them again today. Will check basic labs.  She is afebrile.  Neuro exam is nonfocal.  No nuchal rigidity.  We will load her with Keppra and Depakote as well.  She did not tell the previous provider that she needed a prescription during her last visit.  She  will be provided with one today.  Stressed importance of compliance. She says she lives with her daughter and she can go to the drugstore for her.   Final Clinical Impressions(s) / ED Diagnoses   Final diagnoses:  Seizure ([place])  Nonadherence to medication    ED Discharge Orders    None       Virgel Manifold, MD 07/21/19 0001

## 2019-07-20 NOTE — ED Notes (Signed)
Patient Alert and oriented to baseline. Stable and ambulatory to baseline. Patient verbalized understanding of the discharge instructions.  Patient belongings were taken by the patient.   

## 2019-07-20 NOTE — ED Notes (Signed)
Unable to get labs X two attemps

## 2020-02-01 ENCOUNTER — Encounter (HOSPITAL_COMMUNITY): Payer: Self-pay | Admitting: Emergency Medicine

## 2020-02-01 ENCOUNTER — Other Ambulatory Visit: Payer: Self-pay

## 2020-02-01 ENCOUNTER — Emergency Department (HOSPITAL_COMMUNITY)
Admission: EM | Admit: 2020-02-01 | Discharge: 2020-02-02 | Disposition: A | Discharge status: Home / Self Care | Payer: Medicaid Other | Attending: Emergency Medicine | Admitting: Emergency Medicine

## 2020-02-01 DIAGNOSIS — J449 Chronic obstructive pulmonary disease, unspecified: Secondary | ICD-10-CM | POA: Diagnosis not present

## 2020-02-01 DIAGNOSIS — R21 Rash and other nonspecific skin eruption: Secondary | ICD-10-CM | POA: Diagnosis present

## 2020-02-01 DIAGNOSIS — R569 Unspecified convulsions: Secondary | ICD-10-CM | POA: Insufficient documentation

## 2020-02-01 DIAGNOSIS — Z7982 Long term (current) use of aspirin: Secondary | ICD-10-CM | POA: Diagnosis not present

## 2020-02-01 DIAGNOSIS — J45909 Unspecified asthma, uncomplicated: Secondary | ICD-10-CM | POA: Insufficient documentation

## 2020-02-01 DIAGNOSIS — I10 Essential (primary) hypertension: Secondary | ICD-10-CM | POA: Insufficient documentation

## 2020-02-01 DIAGNOSIS — F17210 Nicotine dependence, cigarettes, uncomplicated: Secondary | ICD-10-CM | POA: Diagnosis not present

## 2020-02-01 MED ORDER — DIVALPROEX SODIUM 500 MG PO DR TAB
500.0000 mg | DELAYED_RELEASE_TABLET | Freq: Once | ORAL | Status: AC
  Administered 2020-02-01: 500 mg via ORAL
  Filled 2020-02-01: qty 1

## 2020-02-01 MED ORDER — LEVETIRACETAM 500 MG PO TABS
750.0000 mg | ORAL_TABLET | Freq: Once | ORAL | Status: AC
  Administered 2020-02-01: 750 mg via ORAL
  Filled 2020-02-01: qty 1

## 2020-02-01 NOTE — ED Triage Notes (Signed)
Per EMS, patient from home, rash to posterior neck and scalp after getting a "super perm" the day before. 50mg  Benadryl PO given with EMS. Ambulatory. A&Ox4.

## 2020-02-01 NOTE — ED Provider Notes (Signed)
[facility] DEPT Provider Note   CSN: [id number] Arrival date & time: 02/01/20  2041     History Chief Complaint  Patient presents with  . Rash    Lindsey [name] is a 64 y.o. female with a history of alcohol use disorders, psychosis, seizures, COPD, HTN, and asthma who presents to the emergency department with a chief complaint of rash.  The patient reports that she awoke yesterday with a rash to her scalp and neck.  Reports that her daughter gave her a "super perm" 6 days ago.  Reports that the perm solution was rinsed from the scalp after it was applied.  She reports that the rash is pruritic.  She was given Benadryl in route with EMS, but no other treatment prior to arrival.  No drainage, fever, chills, neck stiffness, ear pain.  No other new soaps, lotions, hygiene products.  Reports that she has not taken her nighttime dose of her home antiepileptics.  The history is provided by the patient. No language interpreter was used.       Past Medical History:  Diagnosis Date  . Asthma   . H/O ETOH abuse   . Seizure [facility])     Patient Active Problem List   Diagnosis Date Noted  . COPD exacerbation ([place]) 07/07/2019  . Major depressive disorder, recurrent episode, mild ([place]) 07/07/2019  . Anemia   . Alcohol withdrawal delirium ([place]) 03/26/2018  . Seizures ([place]) 06/26/2016  . Patient's noncompliance with other medical treatment and regimen 06/26/2016  . Leukocytosis 06/26/2016  . Tobacco abuse 06/26/2016  . Alcohol withdrawal seizure ([place]) 06/25/2016  . Psychosis ([place]) 11/04/2015  . Essential hypertension 11/03/2015  . 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 Asthma 10/10/2015  . Seizure disorder ([place]) 08/15/2015  . Status epilepticus ([place]) 08/15/2015  . Post-ictal state ([place]) 03/01/2015  . Seizure secondary to subtherapeutic anticonvulsant medication ([place]) 02/06/2015  . Recurrent seizures ([place]) 02/06/2015  . Acute encephalopathy    . Polysubstance abuse ([place])   . Alcohol dependence with withdrawal with complication ([place]) 123456  . Protein-calorie malnutrition, severe ([place]) 03/04/2014  . Encephalopathy 03/04/2014  . Alcohol abuse 11/06/2013  . Alcohol withdrawal ([place]) 11/06/2013  . Marijuana abuse 11/06/2013  . Seizure ([place]) 11/05/2013  . Altered mental status 11/05/2013    Past Surgical History:  Procedure Laterality Date  . ABDOMINAL HYSTERECTOMY    . GALLBLADDER SURGERY       OB History   No obstetric history on file.     Family History  Problem Relation Age of Onset  . Seizures Mother   . Alcohol abuse Mother   . Heart attack Father   . Drug abuse Brother   . Cancer Brother        Pancreatic    Social History   Tobacco Use  . Smoking status: Current Some Day Smoker    Types: Cigarettes  . Smokeless tobacco: Never Used  Substance Use Topics  . Alcohol use: No    Comment: Hx of alchol abuse, not currently drinking  . Drug use: Yes    Types: Marijuana    Comment: "every now and then"    Home Medications Prior to Admission medications   Medication Sig Start Date End Date Taking? Authorizing Provider  acetaminophen (TYLENOL) 500 MG tablet Take 500-1,000 mg by mouth every 8 (eight) hours as needed for mild pain.     [provider]  albuterol (PROAIR HFA) 108 (90 Base) MCG/ACT inhaler Inhale 2 puffs into  the lungs every 6 (six) hours as needed for wheezing or shortness of breath.    [provider]  aspirin EC 81 MG tablet Take 81 mg by mouth daily.    [provider]  citalopram (CELEXA) 10 MG tablet Take 1 tablet (10 mg total) by mouth at bedtime. 07/09/19   Max Sane, MD  clobetasol (OLUX) 0.05 % topical foam Apply topically 2 (two) times daily. 02/02/20   ,  A, PA-C  divalproex (DEPAKOTE) 500 MG DR tablet Take 1 tablet (500 mg total) by mouth 2 (two) times daily. 07/20/19   Virgel Manifold, MD  fluticasone (FLOVENT HFA) 220 MCG/ACT inhaler Inhale  1-2 puffs into the lungs 2 (two) times daily.    [provider]  hydrOXYzine (ATARAX/VISTARIL) 25 MG tablet Take 25 mg by mouth 3 (three) times daily as needed for anxiety or itching.    [provider]  levETIRAcetam (KEPPRA) 750 MG tablet Take 1 tablet (750 mg total) by mouth 2 (two) times daily. 07/20/19   Virgel Manifold, MD  meloxicam (MOBIC) 7.5 MG tablet Take 7.5 mg by mouth 2 (two) times daily.    [provider]  mirtazapine (REMERON) 7.5 MG tablet Take 1 tablet (7.5 mg total) by mouth at bedtime. 07/09/19   Max Sane, MD  Multiple Vitamin (MULTIVITAMIN) tablet Take 1 tablet by mouth daily.    [provider]  pantoprazole (PROTONIX) 40 MG tablet Take 40 mg by mouth daily.    [provider]  Tiotropium Bromide-Olodaterol (STIOLTO RESPIMAT) 2.5-2.5 MCG/ACT AERS Inhale 2 puffs into the lungs daily.    [provider]  Vitamin D, Ergocalciferol, (DRISDOL) 1.25 MG (50000 UT) CAPS capsule Take 50,000 Units by mouth every 7 (seven) days.    [provider]    Allergies    Kiwi extract, Morphine and related, Penicillins, and Strawberry extract  Review of Systems   Review of Systems  Constitutional: Negative for activity change, chills and fever.  Respiratory: Negative for shortness of breath.   Cardiovascular: Negative for chest pain.  Gastrointestinal: Negative for abdominal pain.  Genitourinary: Negative for dysuria.  Musculoskeletal: Negative for back pain.  Skin: Positive for rash.  Allergic/Immunologic: Negative for immunocompromised state.  Neurological: Negative for dizziness, seizures, syncope, weakness and headaches.  Psychiatric/Behavioral: Negative for confusion.    Physical Exam Updated Vital Signs BP 140/72   Pulse (!) 51   Temp 98.6 F (37 C) (Oral)   SpO2 99%   Physical Exam Vitals and nursing note reviewed.  Constitutional:      General: She is not in acute distress. HENT:     Head:  Normocephalic.  Eyes:     Conjunctiva/sclera: Conjunctivae normal.  Cardiovascular:     Rate and Rhythm: Normal rate and regular rhythm.     Heart sounds: No murmur. No friction rub. No gallop.   Pulmonary:     Effort: Pulmonary effort is normal. No respiratory distress.  Abdominal:     General: There is no distension.     Palpations: Abdomen is soft.  Musculoskeletal:     Cervical back: Neck supple.     Comments: Diffuse maculopapular erythematous rash noted to the posterior neck and scalp.  The crown of the scalp appears to be spared.  There are no honey crusted lesions.  The rash is not warm.  No vesicles, petechiae, purpura, bulla, desquamation, red streaking, fluctuance, or induration.  Skin:    General: Skin is warm.     Findings: No  rash.  Neurological:     Mental Status: She is alert.  Psychiatric:        Behavior: Behavior normal.           ED Results / Procedures / Treatments   Labs (all labs ordered are listed, but only abnormal results are displayed) Labs Reviewed - No data to display  EKG None  Radiology No results found.  Procedures Procedures (including critical care time)  Medications Ordered in ED Medications  levETIRAcetam (KEPPRA) tablet 750 mg (750 mg Oral Given 02/01/20 2354)  divalproex (DEPAKOTE) DR tablet 500 mg (500 mg Oral Given 02/01/20 2354)    ED Course  I have reviewed the triage vital signs and the nursing notes.  Pertinent labs & imaging results that were available during my care of the patient were reviewed by me and considered in my medical decision making (see chart for details).    MDM Rules/Calculators/A&P                      64 year old female with a history of alcohol use disorders, psychosis, seizures, COPD, HTN, and asthma presenting with a rash to her scalp and posterior neck, onset yesterday.  Rash is pruritic.  Her daughter applied a "super perm" solution to the scalp approximately 6 days ago.  Reports that the  solution was rinsed off after it was applied.  No constitutional symptoms.  Rash does not appear concerning for cellulitis, zoster, HSV, or seborrheic dermatitis.  No wheals.  Question contact versus irritant dermatitis.  She does report improvement with itching from Benadryl given by EMS.  The patient was discussed with Dr. Leonette Monarch, attending physician. Recommended that she continue Benadryl at home as needed for itching.  We will discharge the patient with clobetasol topical foam to place on the scalp.  She was advised to ensure her scalp was protected by wearing a hat if she was out in the sun.  She has been advised to follow-up with her primary care provider.   Final Clinical Impression(s) / ED Diagnoses Final diagnoses:  Rash and nonspecific skin eruption    Rx / DC Orders ED Discharge Orders         Ordered    clobetasol (OLUX) 0.05 % topical foam  2 times daily     02/02/20 0043           Joline Maxcy A, PA-C 02/02/20 0940    Fatima Blank, MD 02/03/20 [id number]

## 2020-02-02 MED ORDER — CLOBETASOL PROPIONATE 0.05 % EX FOAM: Freq: Two times a day (BID) | CUTANEOUS | 0 refills | Status: DC

## 2020-02-02 NOTE — Discharge Instructions (Addendum)
Thank you for allowing me to care for you today in the Emergency Department.   You can take Benadryl to help with itching.  This is available over-the-counter.  Try to limit how much you are scratching your head because this can put you at risk for infection.  Avoid putting any new hair dyes on your scalp.  Apply clobetasol topical foam to the scalp no more than 2 times daily.  Do not apply this to your face.  Wear a hat if you are on the sun to avoid skin discoloration.  Eucerin Dermo Capillaire Shampoo High Tolerance is available over-the-counter and can be used as directed on the label to help with scalp drying and itching.  You can also use an over-the-counter scalp ointment to help with your symptoms.  Please call to schedule a follow-up appointment with your PCP for recheck of your symptoms.  Return to the emergency department if you start to have thick, mucus-like drainage from the area, if you have an area of the scalp that is significantly swollen and hot to the touch and has a thick, mucus-like drainage, or other new, concerning symptoms.

## 2020-03-12 ENCOUNTER — Other Ambulatory Visit: Payer: Self-pay

## 2020-03-12 ENCOUNTER — Emergency Department (HOSPITAL_COMMUNITY)
Admission: EM | Admit: 2020-03-12 | Discharge: 2020-03-12 | Disposition: A | Discharge status: Home / Self Care | Payer: Medicaid Other | Attending: Emergency Medicine | Admitting: Emergency Medicine

## 2020-03-12 DIAGNOSIS — G40909 Epilepsy, unspecified, not intractable, without status epilepticus: Secondary | ICD-10-CM | POA: Diagnosis not present

## 2020-03-12 DIAGNOSIS — K0889 Other specified disorders of teeth and supporting structures: Secondary | ICD-10-CM

## 2020-03-12 DIAGNOSIS — F17210 Nicotine dependence, cigarettes, uncomplicated: Secondary | ICD-10-CM | POA: Diagnosis not present

## 2020-03-12 DIAGNOSIS — Z79899 Other long term (current) drug therapy: Secondary | ICD-10-CM | POA: Insufficient documentation

## 2020-03-12 DIAGNOSIS — K029 Dental caries, unspecified: Secondary | ICD-10-CM | POA: Insufficient documentation

## 2020-03-12 MED ORDER — CLINDAMYCIN HCL 300 MG PO CAPS: 300.0000 mg | ORAL_CAPSULE | Freq: Four times a day (QID) | ORAL | 0 refills | Status: DC

## 2020-03-12 MED ORDER — HYDROCODONE-ACETAMINOPHEN 5-325 MG PO TABS
2.0000 | ORAL_TABLET | Freq: Once | ORAL | Status: AC
  Administered 2020-03-12: 2 via ORAL
  Filled 2020-03-12: qty 2

## 2020-03-12 MED ORDER — CLINDAMYCIN HCL 300 MG PO CAPS
300.0000 mg | ORAL_CAPSULE | Freq: Once | ORAL | Status: AC
  Administered 2020-03-12: 300 mg via ORAL
  Filled 2020-03-12: qty 1

## 2020-03-12 MED ORDER — HYDROCODONE-ACETAMINOPHEN 5-325 MG PO TABS: 1.0000 | ORAL_TABLET | Freq: Four times a day (QID) | ORAL | 0 refills | Status: DC | PRN

## 2020-03-12 NOTE — ED Provider Notes (Signed)
[facility] DEPT Provider Note   CSN: [id number] Arrival date & time: 03/12/20  1456     History Chief Complaint  Patient presents with  . Dental Pain    Lindsey [name] is a 64 y.o. female.  Patient is a 64 year old female with history of seizure disorder, asthma, and poor dentition.  Patient has been having issues with dental pain, loose teeth, and pain throughout her gums and mouth for quite some time, however this is worsened over the past several days.  She has had bleeding from her teeth/gums.  She denies any difficulty breathing or swallowing.  She denies any fevers or chills.  The history is provided by the patient.  Dental Pain Location:  Generalized Quality:  Throbbing Severity:  Severe Onset quality:  Gradual Timing:  Constant Progression:  Worsening Chronicity:  New Context: poor dentition        Past Medical History:  Diagnosis Date  . Asthma   . H/O ETOH abuse   . Seizure [facility])     Patient Active Problem List   Diagnosis Date Noted  . COPD exacerbation ([place]) 07/07/2019  . Major depressive disorder, recurrent episode, mild ([name]) 07/07/2019  . Anemia   . Alcohol withdrawal delirium ([place]) 03/26/2018  . Seizures ([place]) 06/26/2016  . Patient's noncompliance with other medical treatment and regimen 06/26/2016  . Leukocytosis 06/26/2016  . Tobacco abuse 06/26/2016  . Alcohol withdrawal seizure ([place]) 06/25/2016  . Psychosis ([place]) 11/04/2015  . Essential hypertension 11/03/2015  . 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 Asthma 10/10/2015  . Seizure disorder ([place]) 08/15/2015  . Status epilepticus ([place]) 08/15/2015  . Post-ictal state ([place]) 03/01/2015  . Seizure secondary to subtherapeutic anticonvulsant medication ([place]) 02/06/2015  . Recurrent seizures ([place]) 02/06/2015  . Acute encephalopathy   . Polysubstance abuse ([place])   . Alcohol dependence with withdrawal with complication ([place]) 41/74/0814  .  Protein-calorie malnutrition, severe ([place]) 03/04/2014  . Encephalopathy 03/04/2014  . Alcohol abuse 11/06/2013  . Alcohol withdrawal ([place]) 11/06/2013  . Marijuana abuse 11/06/2013  . Seizure ([place]) 11/05/2013  . Altered mental status 11/05/2013    Past Surgical History:  Procedure Laterality Date  . ABDOMINAL HYSTERECTOMY    . GALLBLADDER SURGERY       OB History   No obstetric history on file.     Family History  Problem Relation Age of Onset  . Seizures Mother   . Alcohol abuse Mother   . Heart attack Father   . Drug abuse Brother   . Cancer Brother        Pancreatic    Social History   Tobacco Use  . Smoking status: Current Some Day Smoker    Types: Cigarettes  . Smokeless tobacco: Never Used  Substance Use Topics  . Alcohol use: No    Comment: Hx of alchol abuse, not currently drinking  . Drug use: Yes    Types: Marijuana    Comment: "every now and then"    Home Medications Prior to Admission medications   Medication Sig Start Date End Date Taking? Authorizing Provider  acetaminophen (TYLENOL) 500 MG tablet Take 500-1,000 mg by mouth every 8 (eight) hours as needed for mild pain.     [provider]  albuterol (PROAIR HFA) 108 (90 Base) MCG/ACT inhaler Inhale 2 puffs into the lungs every 6 (six) hours as needed for wheezing or shortness of breath.    [provider]  aspirin EC 81 MG tablet Take 81 mg by  mouth daily.    [provider]  citalopram (CELEXA) 10 MG tablet Take 1 tablet (10 mg total) by mouth at bedtime. 07/09/19   Max Sane, MD  clobetasol (OLUX) 0.05 % topical foam Apply topically 2 (two) times daily. 02/02/20   McDonald, Mia A, PA-C  divalproex (DEPAKOTE) 500 MG DR tablet Take 1 tablet (500 mg total) by mouth 2 (two) times daily. 07/20/19   Virgel Manifold, MD  fluticasone (FLOVENT HFA) 220 MCG/ACT inhaler Inhale 1-2 puffs into the lungs 2 (two) times daily.    [provider]  hydrOXYzine (ATARAX/VISTARIL) 25  MG tablet Take 25 mg by mouth 3 (three) times daily as needed for anxiety or itching.    [provider]  levETIRAcetam (KEPPRA) 750 MG tablet Take 1 tablet (750 mg total) by mouth 2 (two) times daily. 07/20/19   Virgel Manifold, MD  meloxicam (MOBIC) 7.5 MG tablet Take 7.5 mg by mouth 2 (two) times daily.    [provider]  mirtazapine (REMERON) 7.5 MG tablet Take 1 tablet (7.5 mg total) by mouth at bedtime. 07/09/19   Max Sane, MD  Multiple Vitamin (MULTIVITAMIN) tablet Take 1 tablet by mouth daily.    [provider]  pantoprazole (PROTONIX) 40 MG tablet Take 40 mg by mouth daily.    [provider]  Tiotropium Bromide-Olodaterol (STIOLTO RESPIMAT) 2.5-2.5 MCG/ACT AERS Inhale 2 puffs into the lungs daily.    [provider]  Vitamin D, Ergocalciferol, (DRISDOL) 1.25 MG (50000 UT) CAPS capsule Take 50,000 Units by mouth every 7 (seven) days.    [provider]    Allergies    Kiwi extract, Morphine and related, Penicillins, and Strawberry extract  Review of Systems   Review of Systems  All other systems reviewed and are negative.   Physical Exam Updated Vital Signs BP (!) 146/81   Pulse 95   Temp 98 F (36.7 C) (Oral)   Resp 17   SpO2 96%   Physical Exam Vitals and nursing note reviewed.  Constitutional:      General: She is not in acute distress.    Appearance: Normal appearance. She is not ill-appearing.  HENT:     Head: Normocephalic and atraumatic.     Mouth/Throat:     Mouth: Mucous membranes are moist.     Comments: Patient has several missing teeth along with multiple, heavily decayed teeth throughout.  Her front 2 teeth are loose with gingival inflammation/decay.  There is no stridor.  There is no swelling or tenderness of the neck or submental tissues. Pulmonary:     Effort: Pulmonary effort is normal.     Breath sounds: No stridor.  Skin:    General: Skin is warm and dry.  Neurological:     Mental Status:  She is alert and oriented to person, place, and time.     ED Results / Procedures / Treatments   Labs (all labs ordered are listed, but only abnormal results are displayed) Labs Reviewed - No data to display  EKG None  Radiology No results found.  Procedures Procedures (including critical care time)  Medications Ordered in ED Medications  HYDROcodone-acetaminophen (NORCO/VICODIN) 5-325 MG per tablet 2 tablet (has no administration in time range)  clindamycin (CLEOCIN) capsule 300 mg (has no administration in time range)    ED Course  I have reviewed the triage vital signs and the nursing notes.  Pertinent labs & imaging results that were available during my care of the patient were  reviewed by me and considered in my medical decision making (see chart for details).    MDM Rules/Calculators/A&P  Patient with history of poor dentition presenting with worsening pain, loose teeth, and bleeding from the gums.  Patient has generalized gingival inflammation and multiple heavily decayed teeth.  There is no stridor or evidence for airway/neck issues.  Patient will be treated with antibiotics, pain medication, and follow-up with dentistry.  They have been intact with a dentist, however cannot get seen until the 28th.  Daughter is asking for additional referrals.  Final Clinical Impression(s) / ED Diagnoses Final diagnoses:  None    Rx / DC Orders ED Discharge Orders    None       Veryl Speak, MD 03/12/20 1657

## 2020-03-12 NOTE — Discharge Instructions (Addendum)
Begin taking clindamycin as prescribed.  Take hydrocodone as prescribed as needed for pain.  Follow-up with dentistry.  The dental resource guide has been provided in this discharge summary to assist you in finding a dentist.

## 2020-03-12 NOTE — ED Triage Notes (Addendum)
Patient brought in by daughter.   C/O blood in mouth this morning and unable to eat.   C/O oral pain for 1.5 years. Patient was suppose to take teeth out (6-7 teeth).  Due to patient being allergic to morphine they can not do the surgery per daughter.    Front tooth is also loose.    A/O at baseline  Daughter at bedside Patient in wheelchair.   Daughter is worried for infection.

## 2020-03-13 ENCOUNTER — Telehealth: Payer: Self-pay | Admitting: *Deleted

## 2020-03-13 NOTE — Progress Notes (Addendum)
03/13/2020 1233 pm TOC CM spoke to pt's dtr. States she has located a Chief Financial Officer and appt made for next Tuesday. Pt scheduled to see Dr Annie Main Simoncic in [place]. # [phone], fax [phone]. Called office to verify, they need referral to state Oral Surgeon. Pt does have an appt on next Tuesday. Message sent to ED provider for referral. Jonnie Finner RN [place], [place] ED TOC CM [phone]

## 2020-03-14 NOTE — Telephone Encounter (Signed)
RNCM routed ED visit  Information to office of [facility], DDS as requested.  [phone]

## 2020-03-19 ENCOUNTER — Emergency Department (HOSPITAL_COMMUNITY)
Admission: EM | Admit: 2020-03-19 | Discharge: 2020-03-20 | Disposition: A | Discharge status: Home / Self Care | Payer: Medicaid Other | Attending: Emergency Medicine | Admitting: Emergency Medicine

## 2020-03-19 ENCOUNTER — Encounter (HOSPITAL_COMMUNITY): Payer: Self-pay

## 2020-03-19 ENCOUNTER — Other Ambulatory Visit: Payer: Self-pay

## 2020-03-19 DIAGNOSIS — F17210 Nicotine dependence, cigarettes, uncomplicated: Secondary | ICD-10-CM | POA: Insufficient documentation

## 2020-03-19 DIAGNOSIS — Z7982 Long term (current) use of aspirin: Secondary | ICD-10-CM | POA: Insufficient documentation

## 2020-03-19 DIAGNOSIS — J45909 Unspecified asthma, uncomplicated: Secondary | ICD-10-CM | POA: Diagnosis not present

## 2020-03-19 DIAGNOSIS — R220 Localized swelling, mass and lump, head: Secondary | ICD-10-CM | POA: Insufficient documentation

## 2020-03-19 DIAGNOSIS — J449 Chronic obstructive pulmonary disease, unspecified: Secondary | ICD-10-CM | POA: Insufficient documentation

## 2020-03-19 DIAGNOSIS — G501 Atypical facial pain: Secondary | ICD-10-CM | POA: Insufficient documentation

## 2020-03-19 DIAGNOSIS — R519 Headache, unspecified: Secondary | ICD-10-CM

## 2020-03-19 MED ORDER — HYDROMORPHONE HCL 1 MG/ML IJ SOLN
0.7500 mg | Freq: Once | INTRAMUSCULAR | Status: AC
  Administered 2020-03-20: 0.75 mg via INTRAVENOUS
  Filled 2020-03-19: qty 1

## 2020-03-19 MED ORDER — DEXAMETHASONE SODIUM PHOSPHATE 10 MG/ML IJ SOLN
10.0000 mg | Freq: Once | INTRAMUSCULAR | Status: AC
  Administered 2020-03-20: 10 mg via INTRAVENOUS
  Filled 2020-03-19: qty 1

## 2020-03-19 MED ORDER — CLINDAMYCIN PHOSPHATE 300 MG/50ML IV SOLN
300.0000 mg | Freq: Once | INTRAVENOUS | Status: AC
  Administered 2020-03-20: 300 mg via INTRAVENOUS
  Filled 2020-03-19: qty 50

## 2020-03-19 MED ORDER — LACTATED RINGERS IV BOLUS
1000.0000 mL | Freq: Once | INTRAVENOUS | Status: AC
  Administered 2020-03-20: 1000 mL via INTRAVENOUS

## 2020-03-19 MED ORDER — KETOROLAC TROMETHAMINE 15 MG/ML IJ SOLN
15.0000 mg | Freq: Once | INTRAMUSCULAR | Status: AC
  Administered 2020-03-20: 15 mg via INTRAVENOUS
  Filled 2020-03-19: qty 1

## 2020-03-19 NOTE — ED Triage Notes (Signed)
Patient in by EMS with the complaints of facial swelling and being unable to swallow, patient had recent oral surgery yesterday and had a total of 19 teeth removed, was placed on oral penicillin and thinks she may be having a reaction to medication, patient present with significant swelling in lips and some around eyes, respiratory status unaffected.

## 2020-03-20 MED ORDER — CLINDAMYCIN HCL 300 MG PO CAPS: 300.0000 mg | ORAL_CAPSULE | Freq: Three times a day (TID) | ORAL | 0 refills | Status: DC

## 2020-03-20 NOTE — Discharge Instructions (Addendum)
Stop taking penicillin.  You are being prescribed a new antibiotic called clindamycin.  Take this instead.  Continued pain medicine as prescribed by your oral surgeon.  Follow-up per their recommendations as well.

## 2020-03-20 NOTE — ED Provider Notes (Signed)
[facility] DEPT Provider Note   CSN: [id number] Arrival date & time: 03/19/20  2142     History Chief Complaint  Patient presents with  . Facial Swelling  . Allergic Reaction    possible    Lindsey [name] is a 64 y.o. female.  HPI   64 year old female with facial pain and swelling.  Patient had extensive oral surgery yesterday.  She reports that she had 19 teeth removed.  She was placed on penicillin afterwards but reports she has an allergy.  She is having pain and swelling in her face and lips.  No coughing or wheezing.  She does not feel short of breath.  No dizziness or lightheadedness.  Denies any GI complaints.  She reports that she has been eating and drinking although less than she typically would because of the pain.  Past Medical History:  Diagnosis Date  . Asthma   . H/O ETOH abuse   . Seizure [facility])     Patient Active Problem List   Diagnosis Date Noted  . COPD exacerbation ([place]) 07/07/2019  . Major depressive disorder, recurrent episode, mild ([place]) 07/07/2019  . Anemia   . Alcohol withdrawal delirium ([place]) 03/26/2018  . Seizures ([place]) 06/26/2016  . Patient's noncompliance with other medical treatment and regimen 06/26/2016  . Leukocytosis 06/26/2016  . Tobacco abuse 06/26/2016  . Alcohol withdrawal seizure ([place]) 06/25/2016  . Psychosis ([place]) 11/04/2015  . Essential hypertension 11/03/2015  . 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 Asthma 10/10/2015  . Seizure disorder ([place]) 08/15/2015  . Status epilepticus ([place]) 08/15/2015  . Post-ictal state ([place]) 03/01/2015  . Seizure secondary to subtherapeutic anticonvulsant medication ([place]) 02/06/2015  . Recurrent seizures ([place]) 02/06/2015  . Acute encephalopathy   . Polysubstance abuse ([place])   . Alcohol dependence with withdrawal with complication ([place]) 82/95/6213  . Protein-calorie malnutrition, severe ([place]) 03/04/2014  . Encephalopathy 03/04/2014  . Alcohol abuse  11/06/2013  . Alcohol withdrawal ([place]) 11/06/2013  . Marijuana abuse 11/06/2013  . Seizure ([place]) 11/05/2013  . Altered mental status 11/05/2013    Past Surgical History:  Procedure Laterality Date  . ABDOMINAL HYSTERECTOMY    . GALLBLADDER SURGERY       OB History   No obstetric history on file.     Family History  Problem Relation Age of Onset  . Seizures Mother   . Alcohol abuse Mother   . Heart attack Father   . Drug abuse Brother   . Cancer Brother        Pancreatic    Social History   Tobacco Use  . Smoking status: Current Some Day Smoker    Types: Cigarettes  . Smokeless tobacco: Never Used  Substance Use Topics  . Alcohol use: No    Comment: Hx of alchol abuse, not currently drinking  . Drug use: Yes    Types: Marijuana    Comment: "every now and then"    Home Medications Prior to Admission medications   Medication Sig Start Date End Date Taking? Authorizing Provider  acetaminophen (TYLENOL) 500 MG tablet Take 500-1,000 mg by mouth every 8 (eight) hours as needed for mild pain.     [provider]  albuterol (PROAIR HFA) 108 (90 Base) MCG/ACT inhaler Inhale 2 puffs into the lungs every 6 (six) hours as needed for wheezing or shortness of breath.    [provider]  aspirin EC 81 MG tablet Take 81 mg by mouth daily.    [provider]  citalopram (CELEXA) 10 MG tablet Take 1 tablet (10 mg total) by mouth at bedtime. 07/09/19   Max Sane, MD  clindamycin (CLEOCIN) 300 MG capsule Take 1 capsule (300 mg total) by mouth 4 (four) times daily. X 7 days 03/12/20   Veryl Speak, MD  clobetasol (OLUX) 0.05 % topical foam Apply topically 2 (two) times daily. 02/02/20   McDonald, Mia A, PA-C  divalproex (DEPAKOTE) 500 MG DR tablet Take 1 tablet (500 mg total) by mouth 2 (two) times daily. 07/20/19   Virgel Manifold, MD  fluticasone (FLOVENT HFA) 220 MCG/ACT inhaler Inhale 1-2 puffs into the lungs 2 (two) times daily.    [provider]   HYDROcodone-acetaminophen (NORCO) 5-325 MG tablet Take 1-2 tablets by mouth every 6 (six) hours as needed. 03/12/20   Veryl Speak, MD  hydrOXYzine (ATARAX/VISTARIL) 25 MG tablet Take 25 mg by mouth 3 (three) times daily as needed for anxiety or itching.    [provider]  levETIRAcetam (KEPPRA) 750 MG tablet Take 1 tablet (750 mg total) by mouth 2 (two) times daily. 07/20/19   Virgel Manifold, MD  meloxicam (MOBIC) 7.5 MG tablet Take 7.5 mg by mouth 2 (two) times daily.    [provider]  mirtazapine (REMERON) 7.5 MG tablet Take 1 tablet (7.5 mg total) by mouth at bedtime. 07/09/19   Max Sane, MD  Multiple Vitamin (MULTIVITAMIN) tablet Take 1 tablet by mouth daily.    [provider]  pantoprazole (PROTONIX) 40 MG tablet Take 40 mg by mouth daily.    [provider]  Tiotropium Bromide-Olodaterol (STIOLTO RESPIMAT) 2.5-2.5 MCG/ACT AERS Inhale 2 puffs into the lungs daily.    [provider]  Vitamin D, Ergocalciferol, (DRISDOL) 1.25 MG (50000 UT) CAPS capsule Take 50,000 Units by mouth every 7 (seven) days.    [provider]    Allergies    Kiwi extract, Morphine and related, Penicillins, and Strawberry extract  Review of Systems   Review of Systems All systems reviewed and negative, other than as noted in HPI.  Physical Exam Updated Vital Signs BP 137/84   Pulse 94   Temp 99.5 F (37.5 C) (Oral)   Resp 12   SpO2 93%   Physical Exam Vitals and nursing note reviewed.  Constitutional:      General: She is not in acute distress.    Appearance: She is well-developed.  HENT:     Head: Normocephalic.     Mouth/Throat:     Comments: Puffiness around her eyes and mild swelling of bilateral malar region.  Some mild swelling of both upper and lower lips.  Posterior pharynx looks clear.  Sites of extractions look appropriate.  Sutures intact.  No bleeding noted.  No tongue elevation.  Some mild submental soft tissue tenderness but  certainly no induration.  Neck is supple.  No stridor. Eyes:     General:        Right eye: No discharge.        Left eye: No discharge.     Conjunctiva/sclera: Conjunctivae normal.  Cardiovascular:     Rate and Rhythm: Normal rate and regular rhythm.     Heart sounds: Normal heart sounds. No murmur heard.  No friction rub. No gallop.   Pulmonary:     Effort: Pulmonary effort is normal. No respiratory distress.     Breath sounds: Normal breath sounds.  Abdominal:     General: There is no distension.     Palpations: Abdomen is soft.  Tenderness: There is no abdominal tenderness.  Musculoskeletal:        General: No tenderness.     Cervical back: Neck supple.  Skin:    General: Skin is warm and dry.  Neurological:     Mental Status: She is alert.  Psychiatric:        Behavior: Behavior normal.        Thought Content: Thought content normal.     ED Results / Procedures / Treatments   Labs (all labs ordered are listed, but only abnormal results are displayed) Labs Reviewed - No data to display  EKG None  Radiology No results found.  Procedures Procedures (including critical care time)  Medications Ordered in ED Medications  lactated ringers bolus 1,000 mL (has no administration in time range)  ketorolac (TORADOL) 15 MG/ML injection 15 mg (15 mg Intravenous Given 03/20/20 0019)  HYDROmorphone (DILAUDID) injection 0.75 mg (0.75 mg Intravenous Given 03/20/20 0020)  clindamycin (CLEOCIN) IVPB 300 mg (300 mg Intravenous New Bag/Given 03/20/20 0020)  dexamethasone (DECADRON) injection 10 mg (10 mg Intravenous Given 03/20/20 0020)    ED Course  I have reviewed the triage vital signs and the nursing notes.  Pertinent labs & imaging results that were available during my care of the patient were reviewed by me and considered in my medical decision making (see chart for details).    MDM Rules/Calculators/A&P                          64 year old female with facial pain  and swelling.  I suspect that this is secondary to the significant dental extractions she had yesterday.  What I am seeing on exam does not seem out of proportion to that.  I cannot completely rule out some component of medication reaction though.  She is hemodynamically stable.  No additional symptoms to suggest anaphylaxis. She is not on an ACEI. We will treat her with IV fluids and anti-inflammatories here in the emergency room.  To be on the safe side, will change antibiotics to clindamycin.  At this time I have little significant concern for her airway or significant deep space infection.  Outpatient follow-up with her oral surgeon.  Final Clinical Impression(s) / ED Diagnoses Final diagnoses:  Facial swelling  Facial pain    Rx / DC Orders ED Discharge Orders         Ordered    clindamycin (CLEOCIN) 300 MG capsule  3 times daily     Discontinue  Reprint     03/20/20 0104           Virgel Manifold, MD 03/21/20 2335

## 2020-03-20 NOTE — ED Provider Notes (Signed)
4:07 AM Assumed care from Dr. Darolyn Rua, please see their note for full history, physical and decision making until this point. In brief this is a 64 y.o. year old female who presented to the ED tonight with Facial Swelling and Allergic Reaction (possible)     19 teeth extracted earlier in day, here with mouth and lip swellling possibly from PCN allergy but more likely related to trauma from dental extractions. Pending reeval for improvement.   On multiple reevaluations, states pain is better, able to speak better and overall feels improved. Will switch to clinda per previous plan. Follow up PRN.  Discharge instructions, including strict return precautions for new or worsening symptoms, given. Patient and/or family verbalized understanding and agreement with the plan as described.   Labs, studies and imaging reviewed by myself and considered in medical decision making if ordered. Imaging interpreted by radiology.  Labs Reviewed - No data to display  No orders to display    No follow-ups on file.    Lindsey [name], Corene Cornea, MD 03/20/20 [phone]

## 2020-03-24 ENCOUNTER — Encounter (HOSPITAL_COMMUNITY): Payer: Self-pay

## 2020-03-24 ENCOUNTER — Emergency Department (HOSPITAL_COMMUNITY)
Admission: EM | Admit: 2020-03-24 | Discharge: 2020-03-24 | Disposition: A | Discharge status: Home / Self Care | Payer: Medicaid Other | Attending: Emergency Medicine | Admitting: Emergency Medicine

## 2020-03-24 ENCOUNTER — Other Ambulatory Visit: Payer: Self-pay

## 2020-03-24 DIAGNOSIS — Z7982 Long term (current) use of aspirin: Secondary | ICD-10-CM | POA: Insufficient documentation

## 2020-03-24 DIAGNOSIS — J45909 Unspecified asthma, uncomplicated: Secondary | ICD-10-CM | POA: Diagnosis not present

## 2020-03-24 DIAGNOSIS — R569 Unspecified convulsions: Secondary | ICD-10-CM | POA: Diagnosis present

## 2020-03-24 DIAGNOSIS — Z79899 Other long term (current) drug therapy: Secondary | ICD-10-CM | POA: Diagnosis not present

## 2020-03-24 DIAGNOSIS — F17210 Nicotine dependence, cigarettes, uncomplicated: Secondary | ICD-10-CM | POA: Insufficient documentation

## 2020-03-24 DIAGNOSIS — I10 Essential (primary) hypertension: Secondary | ICD-10-CM | POA: Insufficient documentation

## 2020-03-24 DIAGNOSIS — J449 Chronic obstructive pulmonary disease, unspecified: Secondary | ICD-10-CM | POA: Insufficient documentation

## 2020-03-24 LAB — COMPREHENSIVE METABOLIC PANEL
ALT: 16 U/L (ref 0–44)
AST: 22 U/L (ref 15–41)
Albumin: 4.2 g/dL (ref 3.5–5.0)
Alkaline Phosphatase: 82 U/L (ref 38–126)
Anion gap: 14 (ref 5–15)
BUN: 10 mg/dL (ref 8–23)
CO2: 27 mmol/L (ref 22–32)
Calcium: 9.6 mg/dL (ref 8.9–10.3)
Chloride: 104 mmol/L (ref 98–111)
Creatinine, Ser: 0.93 mg/dL (ref 0.44–1.00)
GFR calc Af Amer: >60 mL/min (ref >60)
GFR calc non Af Amer: >60 mL/min (ref >60)
Glucose, Bld: 99 mg/dL (ref 70–99)
Potassium: 3.9 mmol/L (ref 3.5–5.1)
Sodium: 145 mmol/L (ref 135–145)
Total Bilirubin: 0.7 mg/dL (ref 0.3–1.2)
Total Protein: 8.8 g/dL — ABNORMAL HIGH (ref 6.5–8.1)

## 2020-03-24 LAB — CBC WITH DIFFERENTIAL/PLATELET
Abs Immature Granulocytes: 0.07 10*3/uL (ref 0.00–0.07)
Basophils Absolute: 0.1 10*3/uL (ref 0.0–0.1)
Basophils Relative: 1 %
Eosinophils Absolute: 0.1 10*3/uL (ref 0.0–0.5)
Eosinophils Relative: 1 %
HCT: 43.2 % (ref 36.0–46.0)
Hemoglobin: 14 g/dL (ref 12.0–15.0)
Immature Granulocytes: 1 %
Lymphocytes Relative: 37 %
Lymphs Abs: 4 10*3/uL (ref 0.7–4.0)
MCH: 29.4 pg (ref 26.0–34.0)
MCHC: 32.4 g/dL (ref 30.0–36.0)
MCV: 90.6 fL (ref 80.0–100.0)
Monocytes Absolute: 1.2 10*3/uL — ABNORMAL HIGH (ref 0.1–1.0)
Monocytes Relative: 11 %
Neutro Abs: 5.5 10*3/uL (ref 1.7–7.7)
Neutrophils Relative %: 49 %
Platelets: 483 10*3/uL — ABNORMAL HIGH (ref 150–400)
RBC: 4.77 MIL/uL (ref 3.87–5.11)
RDW: 14.4 % (ref 11.5–15.5)
WBC: 10.9 10*3/uL — ABNORMAL HIGH (ref 4.0–10.5)
nRBC: 0 % (ref 0.0–0.2)

## 2020-03-24 LAB — VALPROIC ACID LEVEL: Valproic Acid Lvl: <10 ug/mL — ABNORMAL LOW (ref 50.0–100.0)

## 2020-03-24 LAB — ETHANOL: Alcohol, Ethyl (B): <10 mg/dL (ref <10)

## 2020-03-24 MED ORDER — VALPROATE SODIUM 500 MG/5ML IV SOLN
500.0000 mg | Freq: Once | INTRAVENOUS | Status: AC
  Administered 2020-03-24: 500 mg via INTRAVENOUS
  Filled 2020-03-24: qty 5

## 2020-03-24 MED ORDER — SODIUM CHLORIDE 0.9 % IV BOLUS
1000.0000 mL | Freq: Once | INTRAVENOUS | Status: AC
  Administered 2020-03-24: 1000 mL via INTRAVENOUS

## 2020-03-24 MED ORDER — ACETAMINOPHEN 500 MG PO TABS
1000.0000 mg | ORAL_TABLET | Freq: Once | ORAL | Status: AC
  Administered 2020-03-24: 1000 mg via ORAL
  Filled 2020-03-24: qty 2

## 2020-03-24 MED ORDER — LEVETIRACETAM IN NACL 1000 MG/100ML IV SOLN
1000.0000 mg | Freq: Once | INTRAVENOUS | Status: AC
  Administered 2020-03-24: 1000 mg via INTRAVENOUS
  Filled 2020-03-24: qty 100

## 2020-03-24 NOTE — ED Notes (Signed)
Daughter informed the patient was about to get catch her ride home. Daughter will be at home to assist patient from the car into the house.

## 2020-03-24 NOTE — ED Triage Notes (Signed)
EMS reports from home, seizure, hx of. Family states Pt had 2 seizures today, did not take Depakote yesterday or today, celebrated birthday yesterday and consumed alcohol. Pt had recent mouth surgery.  BP 160/80 HR 110 RR 20 Sp02 95 RA CBG 100

## 2020-03-24 NOTE — Progress Notes (Signed)
Consult request has been received. CSW attempting to follow up at present time.  CSW received a call from EDP who states pt needs assist with transportation.  CSW called pt's RN and verified pt's address is correct:  [address], [address] [place], [place] [address]  CSW will continue to follow for D/C needs.  Alphonse Guild. Kinneth Fujiwara  MSW, LCSW, LCAS, CSI Transitions of Care Clinical Social Worker Care Coordination Department Ph: [phone]

## 2020-03-24 NOTE — Discharge Instructions (Signed)
You need to take your seizure meds (depakote and keppra) as prescribed   See your doctor and neurologist   Return to ER if you have another seizure, lethargy.

## 2020-03-24 NOTE — ED Provider Notes (Signed)
[facility] DEPT Provider Note   CSN: [id number] Arrival date & time: 03/24/20  1530     History Chief Complaint  Patient presents with  . Seizures    Lindsey [name] is a 64 y.o. female history of alcohol abuse, seizure, here presenting with seizure.  Patient states that [birth year] was her birthday and she was drinking some alcohol and did not take her meds.  She apparently had a 30 second seizure earlier today.  Patient still did not take her meds this morning.  Patient then had another tonic-clonic seizure-like activity and then was confused.  EMS was called by family and patient started waking up and EMS.  Her vitals were stable and no meds were given prior to arrival.  The history is provided by the patient.       Past Medical History:  Diagnosis Date  . Asthma   . H/O ETOH abuse   . Seizure [facility])     Patient Active Problem List   Diagnosis Date Noted  . COPD exacerbation ([place]) 07/07/2019  . Major depressive disorder, recurrent episode, mild ([place]) 07/07/2019  . Anemia   . Alcohol withdrawal delirium ([place]) 03/26/2018  . Seizures ([place]) 06/26/2016  . Patient's noncompliance with other medical treatment and regimen 06/26/2016  . Leukocytosis 06/26/2016  . Tobacco abuse 06/26/2016  . Alcohol withdrawal seizure ([place]) 06/25/2016  . Psychosis ([place]) 11/04/2015  . Essential hypertension 11/03/2015  . 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 Asthma 10/10/2015  . Seizure disorder ([place]) 08/15/2015  . Status epilepticus ([place]) 08/15/2015  . Post-ictal state ([place]) 03/01/2015  . Seizure secondary to subtherapeutic anticonvulsant medication ([place]) 02/06/2015  . Recurrent seizures ([place]) 02/06/2015  . Acute encephalopathy   . Polysubstance abuse ([place])   . Alcohol dependence with withdrawal with complication ([place]) 22/29/7989  . Protein-calorie malnutrition, severe ([place]) 03/04/2014  . Encephalopathy 03/04/2014  . Alcohol abuse 11/06/2013  .  Alcohol withdrawal ([place]) 11/06/2013  . Marijuana abuse 11/06/2013  . Seizure ([place]) 11/05/2013  . Altered mental status 11/05/2013    Past Surgical History:  Procedure Laterality Date  . ABDOMINAL HYSTERECTOMY    . GALLBLADDER SURGERY       OB History   No obstetric history on file.     Family History  Problem Relation Age of Onset  . Seizures Mother   . Alcohol abuse Mother   . Heart attack Father   . Drug abuse Brother   . Cancer Brother        Pancreatic    Social History   Tobacco Use  . Smoking status: Current Some Day Smoker    Types: Cigarettes  . Smokeless tobacco: Never Used  Substance Use Topics  . Alcohol use: No    Comment: Hx of alchol abuse, not currently drinking  . Drug use: Yes    Types: Marijuana    Comment: "every now and then"    Home Medications Prior to Admission medications   Medication Sig Start Date End Date Taking? Authorizing Provider  acetaminophen (TYLENOL) 500 MG tablet Take 500-1,000 mg by mouth every 8 (eight) hours as needed for mild pain.     [provider]  albuterol (PROAIR HFA) 108 (90 Base) MCG/ACT inhaler Inhale 2 puffs into the lungs every 6 (six) hours as needed for wheezing or shortness of breath.    [provider]  aspirin EC 81 MG tablet Take 81 mg by mouth daily.    [provider]  citalopram (CELEXA) 10  MG tablet Take 1 tablet (10 mg total) by mouth at bedtime. 07/09/19   Max Sane, MD  clindamycin (CLEOCIN) 300 MG capsule Take 1 capsule (300 mg total) by mouth 3 (three) times daily. 03/20/20   Virgel Manifold, MD  clobetasol (OLUX) 0.05 % topical foam Apply topically 2 (two) times daily. 02/02/20   McDonald, Mia A, PA-C  divalproex (DEPAKOTE) 500 MG DR tablet Take 1 tablet (500 mg total) by mouth 2 (two) times daily. 07/20/19   Virgel Manifold, MD  fluticasone (FLOVENT HFA) 220 MCG/ACT inhaler Inhale 1-2 puffs into the lungs 2 (two) times daily.    [provider]    HYDROcodone-acetaminophen (NORCO) 5-325 MG tablet Take 1-2 tablets by mouth every 6 (six) hours as needed. 03/12/20   Veryl Speak, MD  hydrOXYzine (ATARAX/VISTARIL) 25 MG tablet Take 25 mg by mouth 3 (three) times daily as needed for anxiety or itching.    [provider]  levETIRAcetam (KEPPRA) 750 MG tablet Take 1 tablet (750 mg total) by mouth 2 (two) times daily. 07/20/19   Virgel Manifold, MD  meloxicam (MOBIC) 7.5 MG tablet Take 7.5 mg by mouth 2 (two) times daily.    [provider]  mirtazapine (REMERON) 7.5 MG tablet Take 1 tablet (7.5 mg total) by mouth at bedtime. 07/09/19   Max Sane, MD  Multiple Vitamin (MULTIVITAMIN) tablet Take 1 tablet by mouth daily.    [provider]  pantoprazole (PROTONIX) 40 MG tablet Take 40 mg by mouth daily.    [provider]  Tiotropium Bromide-Olodaterol (STIOLTO RESPIMAT) 2.5-2.5 MCG/ACT AERS Inhale 2 puffs into the lungs daily.    [provider]  Vitamin D, Ergocalciferol, (DRISDOL) 1.25 MG (50000 UT) CAPS capsule Take 50,000 Units by mouth every 7 (seven) days.    [provider]    Allergies    Kiwi extract, Morphine and related, Penicillins, and Strawberry extract  Review of Systems   Review of Systems  Neurological: Positive for seizures.  All other systems reviewed and are negative.   Physical Exam Updated Vital Signs BP (!) 165/84   Pulse 94   Temp 98.9 F (37.2 C) (Oral)   Resp 19   SpO2 99%   Physical Exam Vitals and nursing note reviewed.  Constitutional:      Comments: Slightly confused, slow to respond   HENT:     Head: Normocephalic and atraumatic.     Comments: No scalp hematoma, no signs of head injury     Nose: Nose normal.     Mouth/Throat:     Mouth: Mucous membranes are moist.  Eyes:     Extraocular Movements: Extraocular movements intact.     Pupils: Pupils are equal, round, and reactive to light.     Comments: No eye deviation   Cardiovascular:      Rate and Rhythm: Normal rate and regular rhythm.     Pulses: Normal pulses.     Heart sounds: Normal heart sounds.  Pulmonary:     Effort: Pulmonary effort is normal.     Breath sounds: Normal breath sounds.  Abdominal:     General: Abdomen is flat.     Palpations: Abdomen is soft.  Musculoskeletal:        General: Normal range of motion.     Cervical back: Normal range of motion and neck supple.  Skin:    General: Skin is warm.     Capillary Refill: Capillary refill takes less than 2 seconds.  Neurological:  General: No focal deficit present.     Comments: Slightly slow to respond but answering questions and then falling asleep. Nl strength and sensation throughout.   Psychiatric:        Mood and Affect: Mood normal.     ED Results / Procedures / Treatments   Labs (all labs ordered are listed, but only abnormal results are displayed) Labs Reviewed  CBC WITH DIFFERENTIAL/PLATELET - Abnormal; Notable for the following components:      Result Value   WBC 10.9 (*)    Platelets 483 (*)    Monocytes Absolute 1.2 (*)    All other components within normal limits  COMPREHENSIVE METABOLIC PANEL - Abnormal; Notable for the following components:   Total Protein 8.8 (*)    All other components within normal limits  VALPROIC ACID LEVEL - Abnormal; Notable for the following components:   Valproic Acid Lvl <10 (*)    All other components within normal limits  ETHANOL    EKG EKG Interpretation  Date/Time:  Monday March 24 2020 15:44:52 EDT Ventricular Rate:  99 PR Interval:    QRS Duration: 96 QT Interval:  375 QTC Calculation: 482 R Axis:   72 Text Interpretation: Sinus rhythm Consider right atrial enlargement Probable left ventricular hypertrophy No significant change since last tracing Confirmed by Wandra Arthurs [phone]) on 03/24/2020 3:47:53 PM   Radiology No results found.  Procedures Procedures (including critical care time)  Medications Ordered in ED Medications    acetaminophen (TYLENOL) tablet 1,000 mg (has no administration in time range)  sodium chloride 0.9 % bolus 1,000 mL (0 mLs Intravenous Stopped 03/24/20 1838)  levETIRAcetam (KEPPRA) IVPB 1000 mg/100 mL premix (0 mg Intravenous Stopped 03/24/20 1640)  valproate (DEPACON) 500 mg in dextrose 5 % 50 mL IVPB (0 mg Intravenous Stopped 03/24/20 1740)    ED Course  I have reviewed the triage vital signs and the nursing notes.  Pertinent labs & imaging results that were available during my care of the patient were reviewed by me and considered in my medical decision making (see chart for details).    MDM Rules/Calculators/A&P                          Rika Daughdrill is a 64 y.o. female here with seizure.  Patient has a history of seizures on Depakote and Keppra but has not been compliant with her meds.  She also drank some alcohol yesterday.  She had 2 seizures today.  She is returning back to baseline right now.  She has no signs of head injury.  Will get basic blood work and give IV load for Keppra and Depakote.  7:06 PM Patient had a seizure on arrival that resolved. Given keppra and depakote. Labs unremarkable.  Patient is back to baseline now.  I updated daughter.  She states that mother has meds at home but just has not been taking it.  Stable for discharge   Final Clinical Impression(s) / ED Diagnoses Final diagnoses:  None    Rx / DC Orders ED Discharge Orders    None       Drenda Freeze, MD 03/24/20 Einar Crow

## 2020-03-24 NOTE — ED Notes (Signed)
Please call when dispo.  Dawn Daughter [phone] [name] Daughter [phone]

## 2020-03-24 NOTE — Progress Notes (Signed)
CSW confirmed with pt's daughter she would be home to assist the pt in getting out of the car and into the home and pt was agreeable.  [facility] via [employer] was [employer].  CSW will continue to follow for D/C needs.  Alphonse Guild. Khoen Genet  MSW, LCSW, LCAS, CCS Transitions of Care Clinical Social Worker Care Coordination Department Ph: [phone]

## 2020-06-29 ENCOUNTER — Encounter (HOSPITAL_COMMUNITY): Payer: Self-pay | Admitting: Emergency Medicine

## 2020-06-29 ENCOUNTER — Emergency Department (HOSPITAL_COMMUNITY)
Admission: EM | Admit: 2020-06-29 | Discharge: 2020-06-29 | Disposition: A | Discharge status: Home / Self Care | Payer: Medicaid Other | Attending: Emergency Medicine | Admitting: Emergency Medicine

## 2020-06-29 ENCOUNTER — Emergency Department (HOSPITAL_COMMUNITY): Payer: Medicaid Other

## 2020-06-29 ENCOUNTER — Other Ambulatory Visit: Payer: Self-pay

## 2020-06-29 DIAGNOSIS — F17210 Nicotine dependence, cigarettes, uncomplicated: Secondary | ICD-10-CM | POA: Diagnosis not present

## 2020-06-29 DIAGNOSIS — J441 Chronic obstructive pulmonary disease with (acute) exacerbation: Secondary | ICD-10-CM | POA: Diagnosis not present

## 2020-06-29 DIAGNOSIS — G40909 Epilepsy, unspecified, not intractable, without status epilepticus: Secondary | ICD-10-CM | POA: Diagnosis not present

## 2020-06-29 DIAGNOSIS — Z20822 Contact with and (suspected) exposure to covid-19: Secondary | ICD-10-CM | POA: Insufficient documentation

## 2020-06-29 DIAGNOSIS — Z79899 Other long term (current) drug therapy: Secondary | ICD-10-CM | POA: Diagnosis not present

## 2020-06-29 DIAGNOSIS — Z7982 Long term (current) use of aspirin: Secondary | ICD-10-CM | POA: Insufficient documentation

## 2020-06-29 DIAGNOSIS — I10 Essential (primary) hypertension: Secondary | ICD-10-CM | POA: Diagnosis not present

## 2020-06-29 DIAGNOSIS — R569 Unspecified convulsions: Secondary | ICD-10-CM

## 2020-06-29 DIAGNOSIS — Z7951 Long term (current) use of inhaled steroids: Secondary | ICD-10-CM | POA: Diagnosis not present

## 2020-06-29 LAB — URINALYSIS, ROUTINE W REFLEX MICROSCOPIC
Bilirubin Urine: NEGATIVE
Glucose, UA: NEGATIVE mg/dL
Hgb urine dipstick: NEGATIVE
Ketones, ur: 5 mg/dL — AB
Leukocytes,Ua: NEGATIVE
Nitrite: NEGATIVE
Protein, ur: NEGATIVE mg/dL
Specific Gravity, Urine: 1.017 (ref 1.005–1.030)
pH: 8 (ref 5.0–8.0)

## 2020-06-29 LAB — BASIC METABOLIC PANEL
Anion gap: 11 (ref 5–15)
BUN: 12 mg/dL (ref 8–23)
CO2: 27 mmol/L (ref 22–32)
Calcium: 9.9 mg/dL (ref 8.9–10.3)
Chloride: 101 mmol/L (ref 98–111)
Creatinine, Ser: 0.92 mg/dL (ref 0.44–1.00)
GFR calc Af Amer: >60 mL/min (ref >60)
GFR calc non Af Amer: >60 mL/min (ref >60)
Glucose, Bld: 104 mg/dL — ABNORMAL HIGH (ref 70–99)
Potassium: 4.6 mmol/L (ref 3.5–5.1)
Sodium: 139 mmol/L (ref 135–145)

## 2020-06-29 LAB — CBC
HCT: 44 % (ref 36.0–46.0)
Hemoglobin: 14.7 g/dL (ref 12.0–15.0)
MCH: 29.1 pg (ref 26.0–34.0)
MCHC: 33.4 g/dL (ref 30.0–36.0)
MCV: 87.1 fL (ref 80.0–100.0)
Platelets: 363 10*3/uL (ref 150–400)
RBC: 5.05 MIL/uL (ref 3.87–5.11)
RDW: 13.8 % (ref 11.5–15.5)
WBC: 12.7 10*3/uL — ABNORMAL HIGH (ref 4.0–10.5)
nRBC: 0 % (ref 0.0–0.2)

## 2020-06-29 LAB — RESPIRATORY PANEL BY RT PCR (FLU A&B, COVID)
Influenza A by PCR: NEGATIVE
Influenza B by PCR: NEGATIVE
SARS Coronavirus 2 by RT PCR: NEGATIVE

## 2020-06-29 LAB — VALPROIC ACID LEVEL: Valproic Acid Lvl: 59 ug/mL (ref 50.0–100.0)

## 2020-06-29 MED ORDER — DIVALPROEX SODIUM 250 MG PO DR TAB
500.0000 mg | DELAYED_RELEASE_TABLET | Freq: Once | ORAL | Status: AC
  Administered 2020-06-29: 500 mg via ORAL
  Filled 2020-06-29: qty 2

## 2020-06-29 MED ORDER — KETOROLAC TROMETHAMINE 15 MG/ML IJ SOLN
15.0000 mg | Freq: Once | INTRAMUSCULAR | Status: AC
  Administered 2020-06-29: 15 mg via INTRAVENOUS
  Filled 2020-06-29: qty 1

## 2020-06-29 MED ORDER — SODIUM CHLORIDE 0.9 % IV BOLUS
500.0000 mL | Freq: Once | INTRAVENOUS | Status: AC
  Administered 2020-06-29: 500 mL via INTRAVENOUS

## 2020-06-29 MED ORDER — LEVETIRACETAM 750 MG PO TABS
750.0000 mg | ORAL_TABLET | Freq: Once | ORAL | Status: AC
  Administered 2020-06-29: 750 mg via ORAL
  Filled 2020-06-29: qty 1

## 2020-06-29 MED ORDER — METOCLOPRAMIDE HCL 5 MG/ML IJ SOLN
10.0000 mg | Freq: Once | INTRAMUSCULAR | Status: AC
  Administered 2020-06-29: 10 mg via INTRAVENOUS
  Filled 2020-06-29: qty 2

## 2020-06-29 NOTE — Discharge Instructions (Addendum)
You may have had a seizure today because she missed her medications yesterday.  It is also possible you had a seizure because of the alcohol he drank.  Even a single beer can trigger a seizure in someone with a history of alcohol withdrawal.  Please do not drink any alcohol ever again.  We gave you your seizure medicine this evening.  You can restart your normal medicines tomorrow morning.  The rest of your work-up in the ER was reassuring.  This included a brain scan.  Please call the neurology number above to schedule another appointment.  He needs to be following with a specialist for your seizures.  This needs close monitoring.  Please not drive a car for the next 6 months.

## 2020-06-29 NOTE — ED Provider Notes (Signed)
[facility] EMERGENCY DEPARTMENT Provider Note   CSN: [id number] Arrival date & time: 06/29/20  1236     History Chief Complaint  Patient presents with  . Seizures    Lindsey [name] is a 64 y.o. female w/ hx of seizures presenting to ED with seizure.  Patient reportedly had 4 or 5 episodes of short seizures at home today witnessed by her family.  She is on Keppra and Depakote for seizures and has been very compliant this medication except for this morning.  Patient reports that she has a history of alcohol withdrawal seizures, which is confirmed by her chart.  She says she has been avoiding any alcohol yesterday when she had a 16 ounce beer.  This is her 1st alcoholic drink in "a long long time".  She denies any recent seizures.  Per medical records last time she was seen in the ED for seizure was in June 2021.  The patient cannot recall who her neurologist is.  Per medical records she was last seen by neurology in 2017 in a consulting setting.    Currently she feels back to her baseline state but reports that she has a mild diffuse headache.  She is not on blood thinners.  She does not think she hit her head.  She said it is not unusual to the headache after seizures.  She does report she had a cough and congestion this week.  She cannot recall if she got vaccinated for Covid.  Our medical records do not confirm this.  Family is not reachable by phone on patient's initial arrival.  Update at 8 PM.  I spoke to the patient's daughter lives with her.  She reports the patient had approximately 4 witnessed seizure events today.  These were the patient generalized "grand mal seizure".  He said the last time the patient had a seizure was a few months ago.  He says she thinks the patient may been a seizure because she missed her medications yesterday as well.  She reports the patient did drink 1 beer yesterday.  Normally the patient has not been drinking recently.  The family member  also confirms the patient no longer actively has a neurologist, as she missed multiple appointments with her neurology clinic and their care was discontinued.  Her seizure medications are prescribed by her PCP.  Keppra 750 mg BID and depakate 500 mg BID.  HPI     Past Medical History:  Diagnosis Date  . Asthma   . H/O ETOH abuse   . Seizure [facility])     Patient Active Problem List   Diagnosis Date Noted  . COPD exacerbation ([place]) 07/07/2019  . Major depressive disorder, recurrent episode, mild ([place]) 07/07/2019  . Anemia   . Alcohol withdrawal delirium ([place]) 03/26/2018  . Seizures ([place]) 06/26/2016  . Patient's noncompliance with other medical treatment and regimen 06/26/2016  . Leukocytosis 06/26/2016  . Tobacco abuse 06/26/2016  . Alcohol withdrawal seizure ([place]) 06/25/2016  . Psychosis ([place]) 11/04/2015  . Essential hypertension 11/03/2015  . 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 Asthma 10/10/2015  . Seizure disorder ([place]) 08/15/2015  . Status epilepticus ([place]) 08/15/2015  . Post-ictal state ([place]) 03/01/2015  . Seizure secondary to subtherapeutic anticonvulsant medication ([place]) 02/06/2015  . Recurrent seizures ([place]) 02/06/2015  . Acute encephalopathy   . Polysubstance abuse ([place])   . Alcohol dependence with withdrawal with complication ([place]) 02/72/5366  . Protein-calorie malnutrition, severe ([place]) 03/04/2014  . Encephalopathy 03/04/2014  .  Alcohol abuse 11/06/2013  . Alcohol withdrawal ([place]) 11/06/2013  . Marijuana abuse 11/06/2013  . Seizure ([place]) 11/05/2013  . Altered mental status 11/05/2013    Past Surgical History:  Procedure Laterality Date  . ABDOMINAL HYSTERECTOMY    . GALLBLADDER SURGERY       OB History   No obstetric history on file.     Family History  Problem Relation Age of Onset  . Seizures Mother   . Alcohol abuse Mother   . Heart attack Father   . Drug abuse Brother   . Cancer Brother        Pancreatic    Social History    Tobacco Use  . Smoking status: Current Some Day Smoker    Types: Cigarettes  . Smokeless tobacco: Never Used  Substance Use Topics  . Alcohol use: No    Comment: Hx of alchol abuse, not currently drinking  . Drug use: Yes    Types: Marijuana    Comment: "every now and then"    Home Medications Prior to Admission medications   Medication Sig Start Date End Date Taking? Authorizing Provider  acetaminophen (TYLENOL) 650 MG CR tablet Take 1,300 mg by mouth every 8 (eight) hours as needed for pain.   Yes [provider]  albuterol (PROAIR HFA) 108 (90 Base) MCG/ACT inhaler Inhale 2 puffs into the lungs every 6 (six) hours as needed for wheezing or shortness of breath.   Yes [provider]  aspirin EC 81 MG tablet Take 81 mg by mouth daily.   Yes [provider]  Cholecalciferol (VITAMIN D3) 1.25 MG (50000 UT) CAPS Take 1 capsule by mouth every Wednesday. 06/11/20  Yes [provider]  clobetasol (OLUX) 0.05 % topical foam Apply topically 2 (two) times daily. Patient taking differently: Apply 1 application topically 2 (two) times daily as needed (itching).  02/02/20  Yes McDonald, Mia A, PA-C  cyclobenzaprine (FLEXERIL) 5 MG tablet Take 5 mg by mouth 3 (three) times daily. 06/11/20  Yes [provider]  divalproex (DEPAKOTE) 500 MG DR tablet Take 1 tablet (500 mg total) by mouth 2 (two) times daily. 07/20/19  Yes Virgel Manifold, MD  fluticasone (FLOVENT HFA) 220 MCG/ACT inhaler Inhale 2 puffs into the lungs 2 (two) times daily.    Yes [provider]  hydrOXYzine (ATARAX/VISTARIL) 25 MG tablet Take 25 mg by mouth 3 (three) times daily as needed for anxiety or itching.   Yes [provider]  levETIRAcetam (KEPPRA) 750 MG tablet Take 1 tablet (750 mg total) by mouth 2 (two) times daily. 07/20/19  Yes Virgel Manifold, MD  meloxicam (MOBIC) 7.5 MG tablet Take 7.5 mg by mouth 2 (two) times daily.   Yes [provider]  Multiple  Vitamin (MULTIVITAMIN WITH MINERALS) TABS tablet Take 1 tablet by mouth daily.   Yes [provider]  pantoprazole (PROTONIX) 40 MG tablet Take 40 mg by mouth daily.   Yes [provider]  Tiotropium Bromide-Olodaterol (STIOLTO RESPIMAT) 2.5-2.5 MCG/ACT AERS Inhale 2 puffs into the lungs daily.   Yes [provider]    Allergies    Kiwi extract, Morphine and related, Penicillins, and Strawberry extract  Review of Systems   Review of Systems  Constitutional: Negative for chills and fever.  HENT: Positive for congestion. Negative for sore throat.   Eyes: Negative for pain and visual disturbance.  Respiratory: Positive for cough. Negative for shortness of breath.   Cardiovascular: Negative for chest pain and palpitations.  Gastrointestinal: Negative for abdominal pain and vomiting.  Genitourinary: Negative for dysuria and hematuria.  Musculoskeletal: Negative for arthralgias and back pain.  Skin: Negative for color change and rash.  Neurological: Positive for seizures, light-headedness and headaches.  Psychiatric/Behavioral: Negative for agitation and confusion.  All other systems reviewed and are negative.   Physical Exam Updated Vital Signs BP (!) 146/81   Pulse 83   Temp 98.9 F (37.2 C) (Oral)   Resp 15   Ht 5\' 8"  (1.727 m)   Wt 75.3 kg   SpO2 96%   BMI 25.24 kg/m   Physical Exam Vitals and nursing note reviewed.  Constitutional:      General: She is not in acute distress.    Appearance: She is well-developed.  HENT:     Head: Normocephalic and atraumatic.  Eyes:     Conjunctiva/sclera: Conjunctivae normal.  Cardiovascular:     Rate and Rhythm: Normal rate and regular rhythm.     Pulses: Normal pulses.  Pulmonary:     Effort: Pulmonary effort is normal. No respiratory distress.     Breath sounds: Normal breath sounds.  Abdominal:     Palpations: Abdomen is soft.     Tenderness: There is no abdominal tenderness.  Musculoskeletal:      Cervical back: Neck supple.  Skin:    General: Skin is warm and dry.  Neurological:     General: No focal deficit present.     Mental Status: She is alert and oriented to person, place, and time.     Cranial Nerves: No cranial nerve deficit.     Sensory: No sensory deficit.     ED Results / Procedures / Treatments   Labs (all labs ordered are listed, but only abnormal results are displayed) Labs Reviewed  BASIC METABOLIC PANEL - Abnormal; Notable for the following components:      Result Value   Glucose, Bld 104 (*)    All other components within normal limits  CBC - Abnormal; Notable for the following components:   WBC 12.7 (*)    All other components within normal limits  URINALYSIS, ROUTINE W REFLEX MICROSCOPIC - Abnormal; Notable for the following components:   Ketones, ur 5 (*)    All other components within normal limits  RESPIRATORY PANEL BY RT PCR (FLU A&B, COVID)  VALPROIC ACID LEVEL  ETHANOL    EKG EKG Interpretation  Date/Time:  Sunday June 29 2020 16:48:20 EDT Ventricular Rate:  97 PR Interval:  148 QRS Duration: 93 QT Interval:  392 QTC Calculation: 498 R Axis:   67 Text Interpretation: Sinus rhythm Probable left ventricular hypertrophy Borderline T abnormalities, inferior leads Borderline prolonged QT interval NO STEMI Confirmed by Octaviano Glow [phone]) on 06/29/2020 7:54:03 PM   Radiology CT Head Wo Contrast  Result Date: 06/29/2020 CLINICAL DATA:  Seizure. EXAM: CT HEAD WITHOUT CONTRAST TECHNIQUE: Contiguous axial images were obtained from the base of the skull through the vertex without intravenous contrast. COMPARISON:  May 21, 2018 FINDINGS: Brain: There is mild cerebral atrophy with widening of the extra-axial spaces and ventricular dilatation. There are areas of decreased attenuation within the white matter tracts of the supratentorial brain, consistent with microvascular disease changes. Vascular: No hyperdense vessel or unexpected  calcification. Skull: Normal. Negative for fracture or focal lesion. Sinuses/Orbits: No acute finding. Other: None. IMPRESSION: 1. Generalized cerebral atrophy. 2. No acute intracranial abnormality. Electronically Signed   By: Virgina Norfolk M.D.   On: 06/29/2020 20:05   DG Chest  Portable 1 View  Result Date: 06/29/2020 CLINICAL DATA:  Seizure. EXAM: PORTABLE CHEST 1 VIEW COMPARISON:  July 06, 2019 FINDINGS: The lungs are hyperinflated. The heart size and mediastinal contours are within normal limits. There is mild calcification of the aortic arch. Both lungs are clear. The visualized skeletal structures are unremarkable. IMPRESSION: No active disease. Electronically Signed   By: Virgina Norfolk M.D.   On: 06/29/2020 17:20    Procedures Procedures (including critical care time)  Medications Ordered in ED Medications  sodium chloride 0.9 % bolus 500 mL (has no administration in time range)  metoCLOPramide (REGLAN) injection 10 mg (10 mg Intravenous Given 06/29/20 2132)  divalproex (DEPAKOTE) DR tablet 500 mg (500 mg Oral Given 06/29/20 2009)  levETIRAcetam (KEPPRA) tablet 750 mg (750 mg Oral Given 06/29/20 2009)  ketorolac (TORADOL) 15 MG/ML injection 15 mg (15 mg Intravenous Given 06/29/20 2133)    ED Course  I have reviewed the triage vital signs and the nursing notes.  Pertinent labs & imaging results that were available during my care of the patient were reviewed by me and considered in my medical decision making (see chart for details).  This is a 64 year old female presenting to emergency department with multiple breakthrough seizures in the setting of missed seizure medications for the past 2 days, as well as drinking alcohol yesterday.  In the past has had a history of alcohol withdrawal seizure.  On exam she appears to be back to her baseline mental status.  She is not actively confused.  No evidence of significant head trauma.  She has no signs or symptoms of acute alcohol  withdrawal at this time.  I suspect this is a breakthrough seizure which may have occurred in the setting of missed medications.  It is also possible this is related to alcohol, although a single beer makes this unlikely, it is not impossible for this to occur with a history of significant alcohol withdrawal.  We will keep an eye on her CIWA score here.  Currently it seems to be normal.  We will also perform an infection work-up.  We will check a UA as well as a Covid test.  If the patient develops worsening headache I would consider CT scan.  However there appears to be no initial report of significant head trauma.  *  Update.  I personally reviewed the patient's labs.  These show an unremarkable BMP.  CBC is fairly unremarkable.  Valproic acid is therapeutic at 59.  UA showed no signs of infection.  Covid and flu was negative.  X-ray shows no focal opacifications consistent with pneumonia.  I have a low suspicion at this time for an infection.  The patient was complaining of worsening headache.  We will give her some IV Reglan and obtain the CT scan now.  I personally reviewed the patient's x-ray and EKG.  EKG shows a sinus rhythm with no acute ST elevations or changes.  QTC is approximately 500.  I personally obtained additional history from the patient's family member, her daughter as noted above.  The scan is negative, I confirmed with the family that I would be discharging her home.  She will need transportation arranged to help her get home as the family does not drive.  We will also give her her evening seizure medications while she is here in the ED.  I strongly advised the patient as well as her family member to refrain from ever drinking alcohol, as even a single beer could  trigger a seizure.  I also advised that they attempt to establish care again with neurology and will give them [place] neurology's number.  Clinical Course as of Jun 29 2141  Nancy Fetter Jun 29, 2020  1651 No answer from  patient's family members on provided cell or home phone numbers.   [MT]  1954 Spoke to daughter - history updated.  Pt pending CTH now.   [MT]    Clinical Course User Index [MT] Damarko Stitely, Carola Rhine, MD   Final Clinical Impression(s) / ED Diagnoses Final diagnoses:  Seizure [facility])    Rx / DC Orders ED Discharge Orders    None       Wyvonnia Dusky, MD 06/29/20 2143

## 2020-06-29 NOTE — ED Triage Notes (Signed)
Pt to triage via [facility].  Pt didn't take seizure meds (depakote and keppra) yesterday.  Family reports 4 seizures today.  Family was able to get pt to take meds this morning.  Reports headache x 2 days.

## 2020-06-29 NOTE — ED Notes (Signed)
Urine sent to lab WITH culture. 

## 2020-09-09 ENCOUNTER — Inpatient Hospital Stay (HOSPITAL_COMMUNITY)
Admission: EM | Admit: 2020-09-09 | Discharge: 2020-09-14 | DRG: 935 | Disposition: A | Discharge status: Home / Self Care | Payer: Medicaid Other | Attending: Internal Medicine | Admitting: Internal Medicine

## 2020-09-09 ENCOUNTER — Emergency Department (HOSPITAL_COMMUNITY): Payer: Medicaid Other

## 2020-09-09 ENCOUNTER — Encounter (HOSPITAL_COMMUNITY): Payer: Self-pay | Admitting: Emergency Medicine

## 2020-09-09 ENCOUNTER — Other Ambulatory Visit: Payer: Self-pay

## 2020-09-09 DIAGNOSIS — G40909 Epilepsy, unspecified, not intractable, without status epilepticus: Secondary | ICD-10-CM | POA: Diagnosis present

## 2020-09-09 DIAGNOSIS — J45901 Unspecified asthma with (acute) exacerbation: Secondary | ICD-10-CM | POA: Diagnosis present

## 2020-09-09 DIAGNOSIS — T310 Burns involving less than 10% of body surface: Secondary | ICD-10-CM | POA: Diagnosis not present

## 2020-09-09 DIAGNOSIS — Z720 Tobacco use: Secondary | ICD-10-CM

## 2020-09-09 DIAGNOSIS — F17210 Nicotine dependence, cigarettes, uncomplicated: Secondary | ICD-10-CM | POA: Diagnosis present

## 2020-09-09 DIAGNOSIS — E872 Acidosis: Secondary | ICD-10-CM | POA: Diagnosis present

## 2020-09-09 DIAGNOSIS — Y9389 Activity, other specified: Secondary | ICD-10-CM

## 2020-09-09 DIAGNOSIS — R109 Unspecified abdominal pain: Secondary | ICD-10-CM

## 2020-09-09 DIAGNOSIS — Y92010 Kitchen of single-family (private) house as the place of occurrence of the external cause: Secondary | ICD-10-CM

## 2020-09-09 DIAGNOSIS — Z79899 Other long term (current) drug therapy: Secondary | ICD-10-CM

## 2020-09-09 DIAGNOSIS — T798XXA Other early complications of trauma, initial encounter: Secondary | ICD-10-CM | POA: Diagnosis present

## 2020-09-09 DIAGNOSIS — F1011 Alcohol abuse, in remission: Secondary | ICD-10-CM | POA: Diagnosis present

## 2020-09-09 DIAGNOSIS — R0902 Hypoxemia: Secondary | ICD-10-CM

## 2020-09-09 DIAGNOSIS — Z791 Long term (current) use of non-steroidal anti-inflammatories (NSAID): Secondary | ICD-10-CM

## 2020-09-09 DIAGNOSIS — Z811 Family history of alcohol abuse and dependence: Secondary | ICD-10-CM

## 2020-09-09 DIAGNOSIS — Z91018 Allergy to other foods: Secondary | ICD-10-CM

## 2020-09-09 DIAGNOSIS — Z813 Family history of other psychoactive substance abuse and dependence: Secondary | ICD-10-CM

## 2020-09-09 DIAGNOSIS — Z23 Encounter for immunization: Secondary | ICD-10-CM

## 2020-09-09 DIAGNOSIS — Z800 Family history of malignant neoplasm of digestive organs: Secondary | ICD-10-CM

## 2020-09-09 DIAGNOSIS — X150XXA Contact with hot stove (kitchen), initial encounter: Secondary | ICD-10-CM | POA: Diagnosis present

## 2020-09-09 DIAGNOSIS — T2025XA Burn of second degree of scalp [any part], initial encounter: Secondary | ICD-10-CM

## 2020-09-09 DIAGNOSIS — Z7951 Long term (current) use of inhaled steroids: Secondary | ICD-10-CM

## 2020-09-09 DIAGNOSIS — E875 Hyperkalemia: Secondary | ICD-10-CM | POA: Diagnosis present

## 2020-09-09 DIAGNOSIS — F29 Unspecified psychosis not due to a substance or known physiological condition: Secondary | ICD-10-CM | POA: Diagnosis present

## 2020-09-09 DIAGNOSIS — Z7982 Long term (current) use of aspirin: Secondary | ICD-10-CM

## 2020-09-09 DIAGNOSIS — J441 Chronic obstructive pulmonary disease with (acute) exacerbation: Secondary | ICD-10-CM | POA: Diagnosis present

## 2020-09-09 DIAGNOSIS — Z880 Allergy status to penicillin: Secondary | ICD-10-CM

## 2020-09-09 DIAGNOSIS — Z20822 Contact with and (suspected) exposure to covid-19: Secondary | ICD-10-CM | POA: Diagnosis present

## 2020-09-09 DIAGNOSIS — Z885 Allergy status to narcotic agent status: Secondary | ICD-10-CM

## 2020-09-09 DIAGNOSIS — Z8249 Family history of ischemic heart disease and other diseases of the circulatory system: Secondary | ICD-10-CM

## 2020-09-09 DIAGNOSIS — T2027XA Burn of second degree of neck, initial encounter: Secondary | ICD-10-CM | POA: Diagnosis present

## 2020-09-09 LAB — BASIC METABOLIC PANEL
Anion gap: 11 (ref 5–15)
BUN: 19 mg/dL (ref 8–23)
CO2: 23 mmol/L (ref 22–32)
Calcium: 9.6 mg/dL (ref 8.9–10.3)
Chloride: 104 mmol/L (ref 98–111)
Creatinine, Ser: 0.97 mg/dL (ref 0.44–1.00)
GFR, Estimated: >60 mL/min (ref >60)
Glucose, Bld: 116 mg/dL — ABNORMAL HIGH (ref 70–99)
Potassium: 4 mmol/L (ref 3.5–5.1)
Sodium: 138 mmol/L (ref 135–145)

## 2020-09-09 LAB — CBC WITH DIFFERENTIAL/PLATELET
Abs Immature Granulocytes: 0.03 10*3/uL (ref 0.00–0.07)
Basophils Absolute: 0 10*3/uL (ref 0.0–0.1)
Basophils Relative: 0 %
Eosinophils Absolute: 0.1 10*3/uL (ref 0.0–0.5)
Eosinophils Relative: 1 %
HCT: 41.5 % (ref 36.0–46.0)
Hemoglobin: 13.9 g/dL (ref 12.0–15.0)
Immature Granulocytes: 0 %
Lymphocytes Relative: 33 %
Lymphs Abs: 3.2 10*3/uL (ref 0.7–4.0)
MCH: 30 pg (ref 26.0–34.0)
MCHC: 33.5 g/dL (ref 30.0–36.0)
MCV: 89.4 fL (ref 80.0–100.0)
Monocytes Absolute: 0.9 10*3/uL (ref 0.1–1.0)
Monocytes Relative: 9 %
Neutro Abs: 5.6 10*3/uL (ref 1.7–7.7)
Neutrophils Relative %: 57 %
Platelets: 319 10*3/uL (ref 150–400)
RBC: 4.64 MIL/uL (ref 3.87–5.11)
RDW: 14 % (ref 11.5–15.5)
WBC: 9.8 10*3/uL (ref 4.0–10.5)
nRBC: 0 % (ref 0.0–0.2)

## 2020-09-09 LAB — RESP PANEL BY RT-PCR (FLU A&B, COVID) ARPGX2
Influenza A by PCR: NEGATIVE
Influenza B by PCR: NEGATIVE
SARS Coronavirus 2 by RT PCR: NEGATIVE

## 2020-09-09 MED ORDER — AEROCHAMBER PLUS FLO-VU LARGE MISC: 1.0000 | Freq: Once | Status: DC

## 2020-09-09 MED ORDER — MAGNESIUM SULFATE 2 GM/50ML IV SOLN
2.0000 g | Freq: Once | INTRAVENOUS | Status: AC
  Administered 2020-09-09: 19:00:00 2 g via INTRAVENOUS
  Filled 2020-09-09: qty 50

## 2020-09-09 MED ORDER — PREDNISONE 20 MG PO TABS
60.0000 mg | ORAL_TABLET | Freq: Once | ORAL | Status: AC
  Administered 2020-09-09: 17:00:00 60 mg via ORAL
  Filled 2020-09-09: qty 3

## 2020-09-09 MED ORDER — FENTANYL CITRATE (PF) 100 MCG/2ML IJ SOLN
100.0000 ug | Freq: Once | INTRAMUSCULAR | Status: AC
  Administered 2020-09-09: 21:00:00 100 ug via INTRAVENOUS
  Filled 2020-09-09: qty 2

## 2020-09-09 MED ORDER — TETANUS-DIPHTH-ACELL PERTUSSIS 5-2.5-18.5 LF-MCG/0.5 IM SUSY
0.5000 mL | PREFILLED_SYRINGE | Freq: Once | INTRAMUSCULAR | Status: AC
  Administered 2020-09-09: 17:00:00 0.5 mL via INTRAMUSCULAR
  Filled 2020-09-09: qty 0.5

## 2020-09-09 MED ORDER — FENTANYL CITRATE (PF) 100 MCG/2ML IJ SOLN
100.0000 ug | Freq: Once | INTRAMUSCULAR | Status: AC
  Administered 2020-09-09: 17:00:00 100 ug via INTRAVENOUS
  Filled 2020-09-09: qty 2

## 2020-09-09 MED ORDER — ALBUTEROL SULFATE HFA 108 (90 BASE) MCG/ACT IN AERS
8.0000 | INHALATION_SPRAY | Freq: Once | RESPIRATORY_TRACT | Status: AC
  Administered 2020-09-09: 17:00:00 8 via RESPIRATORY_TRACT
  Filled 2020-09-09: qty 6.7
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
## 2020-09-09 NOTE — ED Notes (Signed)
Pt SpO2 dropped to 87% during ambulation. Pt winded with complaints of SOB. Pt 92% currently on room air.

## 2020-09-10 ENCOUNTER — Encounter (HOSPITAL_COMMUNITY): Payer: Self-pay | Admitting: Family Medicine

## 2020-09-10 DIAGNOSIS — Z880 Allergy status to penicillin: Secondary | ICD-10-CM | POA: Diagnosis not present

## 2020-09-10 DIAGNOSIS — F1011 Alcohol abuse, in remission: Secondary | ICD-10-CM | POA: Diagnosis present

## 2020-09-10 DIAGNOSIS — R0902 Hypoxemia: Secondary | ICD-10-CM | POA: Diagnosis not present

## 2020-09-10 DIAGNOSIS — Y92010 Kitchen of single-family (private) house as the place of occurrence of the external cause: Secondary | ICD-10-CM | POA: Diagnosis not present

## 2020-09-10 DIAGNOSIS — T2025XA Burn of second degree of scalp [any part], initial encounter: Secondary | ICD-10-CM | POA: Diagnosis not present

## 2020-09-10 DIAGNOSIS — Z885 Allergy status to narcotic agent status: Secondary | ICD-10-CM | POA: Diagnosis not present

## 2020-09-10 DIAGNOSIS — G40909 Epilepsy, unspecified, not intractable, without status epilepticus: Secondary | ICD-10-CM | POA: Diagnosis present

## 2020-09-10 DIAGNOSIS — Z813 Family history of other psychoactive substance abuse and dependence: Secondary | ICD-10-CM | POA: Diagnosis not present

## 2020-09-10 DIAGNOSIS — X150XXA Contact with hot stove (kitchen), initial encounter: Secondary | ICD-10-CM | POA: Diagnosis present

## 2020-09-10 DIAGNOSIS — Z811 Family history of alcohol abuse and dependence: Secondary | ICD-10-CM | POA: Diagnosis not present

## 2020-09-10 DIAGNOSIS — Z79899 Other long term (current) drug therapy: Secondary | ICD-10-CM | POA: Diagnosis not present

## 2020-09-10 DIAGNOSIS — E875 Hyperkalemia: Secondary | ICD-10-CM | POA: Diagnosis present

## 2020-09-10 DIAGNOSIS — T2027XA Burn of second degree of neck, initial encounter: Secondary | ICD-10-CM | POA: Diagnosis present

## 2020-09-10 DIAGNOSIS — J45901 Unspecified asthma with (acute) exacerbation: Secondary | ICD-10-CM | POA: Diagnosis present

## 2020-09-10 DIAGNOSIS — J441 Chronic obstructive pulmonary disease with (acute) exacerbation: Secondary | ICD-10-CM | POA: Diagnosis present

## 2020-09-10 DIAGNOSIS — Z7982 Long term (current) use of aspirin: Secondary | ICD-10-CM | POA: Diagnosis not present

## 2020-09-10 DIAGNOSIS — Z8249 Family history of ischemic heart disease and other diseases of the circulatory system: Secondary | ICD-10-CM | POA: Diagnosis not present

## 2020-09-10 DIAGNOSIS — F17210 Nicotine dependence, cigarettes, uncomplicated: Secondary | ICD-10-CM | POA: Diagnosis present

## 2020-09-10 DIAGNOSIS — T310 Burns involving less than 10% of body surface: Secondary | ICD-10-CM | POA: Diagnosis present

## 2020-09-10 DIAGNOSIS — Z91018 Allergy to other foods: Secondary | ICD-10-CM | POA: Diagnosis not present

## 2020-09-10 DIAGNOSIS — T798XXA Other early complications of trauma, initial encounter: Secondary | ICD-10-CM | POA: Diagnosis present

## 2020-09-10 DIAGNOSIS — F29 Unspecified psychosis not due to a substance or known physiological condition: Secondary | ICD-10-CM | POA: Diagnosis present

## 2020-09-10 DIAGNOSIS — Z791 Long term (current) use of non-steroidal anti-inflammatories (NSAID): Secondary | ICD-10-CM | POA: Diagnosis not present

## 2020-09-10 DIAGNOSIS — Z23 Encounter for immunization: Secondary | ICD-10-CM | POA: Diagnosis not present

## 2020-09-10 DIAGNOSIS — Y9389 Activity, other specified: Secondary | ICD-10-CM | POA: Diagnosis not present

## 2020-09-10 DIAGNOSIS — E872 Acidosis: Secondary | ICD-10-CM | POA: Diagnosis present

## 2020-09-10 DIAGNOSIS — Z20822 Contact with and (suspected) exposure to covid-19: Secondary | ICD-10-CM | POA: Diagnosis present

## 2020-09-10 DIAGNOSIS — Z7951 Long term (current) use of inhaled steroids: Secondary | ICD-10-CM | POA: Diagnosis not present

## 2020-09-10 LAB — I-STAT VENOUS BLOOD GAS, ED
Acid-Base Excess: 1 mmol/L (ref 0.0–2.0)
Bicarbonate: 22.9 mmol/L (ref 20.0–28.0)
Calcium, Ion: 1.01 mmol/L — ABNORMAL LOW (ref 1.15–1.40)
HCT: 46 % (ref 36.0–46.0)
Hemoglobin: 15.6 g/dL — ABNORMAL HIGH (ref 12.0–15.0)
O2 Saturation: 85 %
Potassium: >8.5 mmol/L (ref 3.5–5.1)
Sodium: 133 mmol/L — ABNORMAL LOW (ref 135–145)
TCO2: 24 mmol/L (ref 22–32)
pCO2, Ven: 28.8 mmHg — ABNORMAL LOW (ref 44.0–60.0)
pH, Ven: 7.509 — ABNORMAL HIGH (ref 7.250–7.430)
pO2, Ven: 44 mmHg (ref 32.0–45.0)

## 2020-09-10 LAB — BASIC METABOLIC PANEL
Anion gap: 12 (ref 5–15)
Anion gap: 9 (ref 5–15)
BUN: 18 mg/dL (ref 8–23)
BUN: 19 mg/dL (ref 8–23)
CO2: 18 mmol/L — ABNORMAL LOW (ref 22–32)
CO2: 23 mmol/L (ref 22–32)
Calcium: 9.1 mg/dL (ref 8.9–10.3)
Calcium: 8.9 mg/dL (ref 8.9–10.3)
Chloride: 106 mmol/L (ref 98–111)
Chloride: 102 mmol/L (ref 98–111)
Creatinine, Ser: 0.73 mg/dL (ref 0.44–1.00)
Creatinine, Ser: 0.83 mg/dL (ref 0.44–1.00)
GFR, Estimated: >60 mL/min (ref >60)
GFR, Estimated: >60 mL/min (ref >60)
Glucose, Bld: 133 mg/dL — ABNORMAL HIGH (ref 70–99)
Glucose, Bld: 149 mg/dL — ABNORMAL HIGH (ref 70–99)
Potassium: 5.4 mmol/L — ABNORMAL HIGH (ref 3.5–5.1)
Potassium: 4.3 mmol/L (ref 3.5–5.1)
Sodium: 136 mmol/L (ref 135–145)
Sodium: 134 mmol/L — ABNORMAL LOW (ref 135–145)

## 2020-09-10 LAB — HIV ANTIBODY (ROUTINE TESTING W REFLEX): HIV Screen 4th Generation wRfx: NONREACTIVE

## 2020-09-10 LAB — CBC
HCT: 46.1 % — ABNORMAL HIGH (ref 36.0–46.0)
Hemoglobin: 15.4 g/dL — ABNORMAL HIGH (ref 12.0–15.0)
MCH: 30.2 pg (ref 26.0–34.0)
MCHC: 33.4 g/dL (ref 30.0–36.0)
MCV: 90.4 fL (ref 80.0–100.0)
Platelets: 323 10*3/uL (ref 150–400)
RBC: 5.1 MIL/uL (ref 3.87–5.11)
RDW: 14 % (ref 11.5–15.5)
WBC: 9.1 10*3/uL (ref 4.0–10.5)
nRBC: 0 % (ref 0.0–0.2)

## 2020-09-10 MED ORDER — PREDNISONE 20 MG PO TABS
50.0000 mg | ORAL_TABLET | Freq: Every day | ORAL | Status: DC
  Administered 2020-09-10 – 2020-09-12 (×3): 50 mg via ORAL
  Filled 2020-09-10: qty 2
  Filled 2020-09-10: qty 3
  Filled 2020-09-10: qty 2

## 2020-09-10 MED ORDER — ACETAMINOPHEN 325 MG PO TABS
650.0000 mg | ORAL_TABLET | Freq: Four times a day (QID) | ORAL | Status: DC | PRN
  Administered 2020-09-11 – 2020-09-12 (×3): 650 mg via ORAL
  Filled 2020-09-10 (×3): qty 2

## 2020-09-10 MED ORDER — SODIUM CHLORIDE 0.9 % IV SOLN: INTRAVENOUS | Status: DC

## 2020-09-10 MED ORDER — SODIUM CHLORIDE 0.9 % IV BOLUS
500.0000 mL | Freq: Once | INTRAVENOUS | Status: AC
  Administered 2020-09-10: 10:00:00 500 mL via INTRAVENOUS

## 2020-09-10 MED ORDER — FUROSEMIDE 10 MG/ML IJ SOLN
20.0000 mg | Freq: Once | INTRAMUSCULAR | Status: AC
  Administered 2020-09-10: 20 mg via INTRAVENOUS
  Filled 2020-09-10: qty 2

## 2020-09-10 MED ORDER — LEVETIRACETAM 750 MG PO TABS
750.0000 mg | ORAL_TABLET | Freq: Once | ORAL | Status: AC
  Administered 2020-09-10: 02:00:00 750 mg via ORAL
  Filled 2020-09-10: qty 1

## 2020-09-10 MED ORDER — SODIUM ZIRCONIUM CYCLOSILICATE 10 G PO PACK
10.0000 g | PACK | Freq: Once | ORAL | Status: AC
  Administered 2020-09-10: 11:00:00 10 g via ORAL
  Filled 2020-09-10: qty 1

## 2020-09-10 MED ORDER — ALBUTEROL SULFATE (2.5 MG/3ML) 0.083% IN NEBU
2.5000 mg | INHALATION_SOLUTION | Freq: Four times a day (QID) | RESPIRATORY_TRACT | Status: DC | PRN
  Administered 2020-09-10: 2.5 mg via RESPIRATORY_TRACT
  Filled 2020-09-10 (×2): qty 3

## 2020-09-10 MED ORDER — POLYETHYLENE GLYCOL 3350 17 G PO PACK: 17.0000 g | PACK | Freq: Every day | ORAL | Status: DC | PRN

## 2020-09-10 MED ORDER — LEVETIRACETAM 750 MG PO TABS
750.0000 mg | ORAL_TABLET | Freq: Two times a day (BID) | ORAL | Status: DC
  Administered 2020-09-10 – 2020-09-14 (×9): 750 mg via ORAL
  Filled 2020-09-10 (×11): qty 1

## 2020-09-10 MED ORDER — BUDESONIDE 0.5 MG/2ML IN SUSP
1.0000 mg | Freq: Two times a day (BID) | RESPIRATORY_TRACT | Status: DC
  Administered 2020-09-10 – 2020-09-13 (×6): 1 mg via RESPIRATORY_TRACT
  Administered 2020-09-13 – 2020-09-14 (×2): 0.5 mg via RESPIRATORY_TRACT
  Filled 2020-09-10 (×10): qty 4

## 2020-09-10 MED ORDER — DIVALPROEX SODIUM 250 MG PO DR TAB
500.0000 mg | DELAYED_RELEASE_TABLET | Freq: Once | ORAL | Status: AC
  Administered 2020-09-10: 02:00:00 500 mg via ORAL
  Filled 2020-09-10: qty 2

## 2020-09-10 MED ORDER — ONDANSETRON HCL 4 MG/2ML IJ SOLN: 4.0000 mg | Freq: Four times a day (QID) | INTRAMUSCULAR | Status: DC | PRN

## 2020-09-10 MED ORDER — FENTANYL CITRATE (PF) 100 MCG/2ML IJ SOLN
25.0000 ug | INTRAMUSCULAR | Status: DC | PRN
Start: 2020-09-10 — End: 2020-09-14
  Administered 2020-09-10 – 2020-09-13 (×14): 50 ug via INTRAVENOUS
  Filled 2020-09-10 (×15): qty 2

## 2020-09-10 MED ORDER — ALBUTEROL SULFATE (2.5 MG/3ML) 0.083% IN NEBU
2.5000 mg | INHALATION_SOLUTION | Freq: Four times a day (QID) | RESPIRATORY_TRACT | Status: DC
  Administered 2020-09-10: 2.5 mg via RESPIRATORY_TRACT
  Filled 2020-09-10: qty 3

## 2020-09-10 MED ORDER — NICOTINE 21 MG/24HR TD PT24
21.0000 mg | MEDICATED_PATCH | Freq: Every day | TRANSDERMAL | Status: DC
  Administered 2020-09-10 – 2020-09-14 (×5): 21 mg via TRANSDERMAL
  Filled 2020-09-10 (×5): qty 1

## 2020-09-10 MED ORDER — FLUTICASONE PROPIONATE HFA 220 MCG/ACT IN AERO: 2.0000 | INHALATION_SPRAY | Freq: Two times a day (BID) | RESPIRATORY_TRACT | Status: DC

## 2020-09-10 MED ORDER — BACITRACIN-NEOMYCIN-POLYMYXIN OINTMENT TUBE
TOPICAL_OINTMENT | Freq: Every day | CUTANEOUS | Status: DC
  Administered 2020-09-10: 1 via TOPICAL
  Filled 2020-09-10 (×6): qty 14

## 2020-09-10 MED ORDER — UMECLIDINIUM BROMIDE 62.5 MCG/INH IN AEPB
1.0000 | INHALATION_SPRAY | Freq: Every day | RESPIRATORY_TRACT | Status: DC
  Administered 2020-09-10 – 2020-09-14 (×5): 1 via RESPIRATORY_TRACT
  Filled 2020-09-10: qty 7

## 2020-09-10 MED ORDER — PANTOPRAZOLE SODIUM 40 MG PO TBEC
40.0000 mg | DELAYED_RELEASE_TABLET | Freq: Every day | ORAL | Status: DC
  Administered 2020-09-10 – 2020-09-14 (×5): 40 mg via ORAL
  Filled 2020-09-10 (×6): qty 1

## 2020-09-10 MED ORDER — ALBUTEROL SULFATE (2.5 MG/3ML) 0.083% IN NEBU: 2.5000 mg | INHALATION_SOLUTION | RESPIRATORY_TRACT | Status: DC | PRN

## 2020-09-10 MED ORDER — TIOTROPIUM BROMIDE MONOHYDRATE 18 MCG IN CAPS: 18.0000 ug | ORAL_CAPSULE | Freq: Every day | RESPIRATORY_TRACT | Status: DC

## 2020-09-10 MED ORDER — ACETAMINOPHEN 650 MG RE SUPP: 650.0000 mg | Freq: Four times a day (QID) | RECTAL | Status: DC | PRN

## 2020-09-10 MED ORDER — DIVALPROEX SODIUM 500 MG PO DR TAB
500.0000 mg | DELAYED_RELEASE_TABLET | Freq: Two times a day (BID) | ORAL | Status: DC
  Administered 2020-09-10 – 2020-09-14 (×9): 500 mg via ORAL
  Filled 2020-09-10 (×11): qty 1

## 2020-09-10 MED ORDER — ENOXAPARIN SODIUM 40 MG/0.4ML ~~LOC~~ SOLN
40.0000 mg | SUBCUTANEOUS | Status: DC
  Administered 2020-09-10 – 2020-09-13 (×4): 40 mg via SUBCUTANEOUS
  Filled 2020-09-10 (×4): qty 0.4

## 2020-09-10 MED ORDER — ONDANSETRON HCL 4 MG PO TABS: 4.0000 mg | ORAL_TABLET | Freq: Four times a day (QID) | ORAL | Status: DC | PRN

## 2020-09-10 MED ORDER — TRAMADOL HCL 50 MG PO TABS
50.0000 mg | ORAL_TABLET | Freq: Four times a day (QID) | ORAL | Status: DC | PRN
  Administered 2020-09-11 – 2020-09-14 (×7): 50 mg via ORAL
  Filled 2020-09-10 (×7): qty 1
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
## 2020-09-10 NOTE — ED Notes (Signed)
Dr Regalado at bedside.  
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
## 2020-09-10 NOTE — ED Notes (Signed)
Dr Tyrell Antonio secured messaged   Pt with edema to bilateral eyes at this time, this is a change from previous assessment. Pt reports its getting difficult for her to see

## 2020-09-10 NOTE — ED Notes (Signed)
MS Breakfast Ordered 

## 2020-09-10 NOTE — ED Notes (Signed)
Dr Tyrell Antonio secured message  Pt is unable to open left eye completely close with edema, right orbit edemas. Pt swelling is  progressively worsening

## 2020-09-10 NOTE — ED Notes (Signed)
Lunch Tray Ordered @ 1031. 

## 2020-09-11 LAB — POCT I-STAT EG7
Acid-Base Excess: 1 mmol/L (ref 0.0–2.0)
Bicarbonate: 22.9 mmol/L (ref 20.0–28.0)
Calcium, Ion: 1.01 mmol/L — ABNORMAL LOW (ref 1.15–1.40)
HCT: 46 % (ref 36.0–46.0)
Hemoglobin: 15.6 g/dL — ABNORMAL HIGH (ref 12.0–15.0)
O2 Saturation: 85 %
Potassium: >8.5 mmol/L (ref 3.5–5.1)
Sodium: 133 mmol/L — ABNORMAL LOW (ref 135–145)
TCO2: 24 mmol/L (ref 22–32)
pCO2, Ven: 28.8 mmHg — ABNORMAL LOW (ref 44.0–60.0)
pH, Ven: 7.509 — ABNORMAL HIGH (ref 7.250–7.430)
pO2, Ven: 44 mmHg (ref 32.0–45.0)

## 2020-09-11 LAB — BASIC METABOLIC PANEL
Anion gap: 13 (ref 5–15)
BUN: 19 mg/dL (ref 8–23)
CO2: 20 mmol/L — ABNORMAL LOW (ref 22–32)
Calcium: 9.1 mg/dL (ref 8.9–10.3)
Chloride: 103 mmol/L (ref 98–111)
Creatinine, Ser: 0.96 mg/dL (ref 0.44–1.00)
GFR, Estimated: >60 mL/min (ref >60)
Glucose, Bld: 85 mg/dL (ref 70–99)
Potassium: 4.6 mmol/L (ref 3.5–5.1)
Sodium: 136 mmol/L (ref 135–145)

## 2020-09-11 MED ORDER — SENNA 8.6 MG PO TABS
1.0000 | ORAL_TABLET | Freq: Every day | ORAL | Status: DC
  Administered 2020-09-11 – 2020-09-12 (×2): 8.6 mg via ORAL
  Filled 2020-09-11 (×2): qty 1

## 2020-09-11 MED ORDER — GUAIFENESIN ER 600 MG PO TB12
600.0000 mg | ORAL_TABLET | Freq: Two times a day (BID) | ORAL | Status: DC
  Administered 2020-09-11 – 2020-09-14 (×7): 600 mg via ORAL
  Filled 2020-09-11 (×8): qty 1

## 2020-09-11 MED ORDER — FUROSEMIDE 10 MG/ML IJ SOLN
20.0000 mg | Freq: Once | INTRAMUSCULAR | Status: AC
  Administered 2020-09-11: 20 mg via INTRAVENOUS
  Filled 2020-09-11: qty 2

## 2020-09-11 MED ORDER — ENSURE ENLIVE PO LIQD
237.0000 mL | Freq: Two times a day (BID) | ORAL | Status: DC
  Administered 2020-09-11 – 2020-09-14 (×3): 237 mL via ORAL
  Filled 2020-09-11: qty 237

## 2020-09-11 MED ORDER — POLYETHYLENE GLYCOL 3350 17 G PO PACK
17.0000 g | PACK | Freq: Every day | ORAL | Status: DC
  Administered 2020-09-11 – 2020-09-12 (×2): 17 g via ORAL
  Filled 2020-09-11 (×2): qty 1
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
## 2020-09-11 NOTE — Progress Notes (Signed)
Visited with patient this morning and patient appeared to be doing ok. Patient expressed concerns for her daughters . One with cancer and the other who has her really concern about her behavior.. Patient request prayer for self and daughters. I  Provided emotional and spiritual support and prayed with patient per her expressed concerns. Patient appeared to feel better after visit.  Will follow as needed.     Jaclynn Major, [place], [facility], Pager [phone]
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
## 2020-09-12 LAB — BASIC METABOLIC PANEL
Anion gap: 11 (ref 5–15)
BUN: 26 mg/dL — ABNORMAL HIGH (ref 8–23)
CO2: 27 mmol/L (ref 22–32)
Calcium: 9.4 mg/dL (ref 8.9–10.3)
Chloride: 96 mmol/L — ABNORMAL LOW (ref 98–111)
Creatinine, Ser: 0.93 mg/dL (ref 0.44–1.00)
GFR, Estimated: >60 mL/min (ref >60)
Glucose, Bld: 84 mg/dL (ref 70–99)
Potassium: 4.2 mmol/L (ref 3.5–5.1)
Sodium: 134 mmol/L — ABNORMAL LOW (ref 135–145)

## 2020-09-12 MED ORDER — DIPHENHYDRAMINE HCL 25 MG PO CAPS
25.0000 mg | ORAL_CAPSULE | Freq: Three times a day (TID) | ORAL | Status: DC | PRN
  Administered 2020-09-12 – 2020-09-13 (×4): 25 mg via ORAL
  Filled 2020-09-12 (×4): qty 1

## 2020-09-12 MED ORDER — PREDNISONE 20 MG PO TABS
30.0000 mg | ORAL_TABLET | Freq: Every day | ORAL | Status: DC
  Administered 2020-09-13 – 2020-09-14 (×2): 30 mg via ORAL
  Filled 2020-09-12 (×2): qty 1
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
## 2020-09-13 ENCOUNTER — Inpatient Hospital Stay (HOSPITAL_COMMUNITY): Payer: Medicaid Other

## 2020-09-13 MED ORDER — PREDNISONE 10 MG PO TABS: 30.0000 mg | ORAL_TABLET | Freq: Every day | ORAL | 0 refills | Status: AC

## 2020-09-13 MED ORDER — NICOTINE 21 MG/24HR TD PT24: 21.0000 mg | MEDICATED_PATCH | Freq: Every day | TRANSDERMAL | 0 refills | Status: DC

## 2020-09-13 MED ORDER — BACITRACIN-NEOMYCIN-POLYMYXIN OINTMENT TUBE: 1.0000 "application " | TOPICAL_OINTMENT | Freq: Every day | CUTANEOUS | 1 refills | Status: DC

## 2020-09-13 MED ORDER — TRAMADOL HCL 50 MG PO TABS: 50.0000 mg | ORAL_TABLET | Freq: Four times a day (QID) | ORAL | 0 refills | Status: AC | PRN

## 2020-09-13 MED ORDER — SENNA 8.6 MG PO TABS: 1.0000 | ORAL_TABLET | Freq: Every day | ORAL | 0 refills | Status: DC

## 2020-09-13 MED ORDER — GUAIFENESIN ER 600 MG PO TB12: 600.0000 mg | ORAL_TABLET | Freq: Two times a day (BID) | ORAL | 0 refills | Status: DC

## 2020-09-13 MED ORDER — POLYETHYLENE GLYCOL 3350 17 G PO PACK: 17.0000 g | PACK | Freq: Every day | ORAL | 0 refills | Status: DC

## 2020-09-13 NOTE — Plan of Care (Signed)
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
## 2020-09-13 NOTE — Care Management (Signed)
    Durable Medical Equipment  (From admission, onward)         Start     Ordered   09/12/20 0846  For home use only DME Hospital bed  Once       Question Answer Comment  Length of Need 6 Months   The above medical condition requires: Patient requires the ability to reposition frequently   Head must be elevated greater than: 30 degrees   Bed type Semi-electric      09/12/20 0845   09/12/20 0846  For home use only DME high strength lightweight manual wheelchair with seat cushion  Once       Comments: Patient suffers from debility, left leg weakness which impairs their ability to perform daily activities like  ambulate, bathe, in the home.  A walker will not resolve  issue with performing activities of daily living. A wheelchair will allow patient to safely perform daily activities.Length of need 12 months . Accessories: elevating leg rests (ELRs), wheel locks, extensions and anti-tippers.   09/12/20 0846   09/12/20 0845  For home use only DME 3 n 1  Once        09/12/20 0845
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
## 2020-09-14 ENCOUNTER — Inpatient Hospital Stay (HOSPITAL_COMMUNITY): Payer: Medicaid Other

## 2020-09-14 MED ORDER — SODIUM CHLORIDE 0.9 % IV SOLN: INTRAVENOUS | Status: DC

## 2020-09-14 NOTE — TOC Transition Note (Signed)
Transition of Care [facility]) - CM/SW Discharge Note   Patient Details  Name: Lindsey [name] MRN: [id number] Date of Birth: 1956/01/12  Transition of [facility]) CM/SW Contact:  Pollie Friar, RN Phone Number: 09/14/2020, 9:57 AM   Clinical Narrative:    Pt discharging home with self care. Per CM note yesterday HH could not be arranged.  3 in 1 and rollator to be be delivered to home per yesterdays CM note.  Pt transporting via [facility]. CM has made arrangements for 12 pm pickup per daughter request. Bedside RN updated and d/c packet at the desk.    Final next level of care: Home/Self Care Barriers to Discharge: No Barriers Identified   Patient Goals and CMS Choice Patient states their goals for this hospitalization and ongoing recovery are:: Patient states that shejust wants to get better and go home CMS Medicare.gov Compare Post Acute Care list provided to:: Patient Choice offered to / list presented to : Patient  Discharge Placement                       Discharge Plan and Services In-house Referral: NA Discharge Planning Services: CM Consult Post Acute Care Choice: NA          DME Arranged: 3-N-1,Walker rolling with seat DME Agency: [employer] Date DME Agency Contacted: 09/13/20   Representative spoke with at DME Agency: [employer] HH Arranged: NA [place] Agency: NA        Social Determinants of Health ([place]) Interventions     Readmission Risk Interventions Readmission Risk Prevention Plan 09/12/2020 07/09/2019  Post Dischage Appt - Complete  Medication Screening - Complete  Transportation Screening Complete Complete  PCP or Specialist Appt within 5-7 Days Complete -  Home Care Screening Complete -  Medication Review (RN CM) Referral to Pharmacy -  Some recent data might be hidden
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
## 2020-09-30 ENCOUNTER — Other Ambulatory Visit: Payer: Self-pay

## 2020-09-30 ENCOUNTER — Emergency Department (HOSPITAL_COMMUNITY)
Admission: EM | Admit: 2020-09-30 | Discharge: 2020-10-01 | Disposition: A | Discharge status: Home / Self Care | Payer: Medicaid Other | Attending: Emergency Medicine | Admitting: Emergency Medicine

## 2020-09-30 DIAGNOSIS — I10 Essential (primary) hypertension: Secondary | ICD-10-CM | POA: Insufficient documentation

## 2020-09-30 DIAGNOSIS — X088XXA Exposure to other specified smoke, fire and flames, initial encounter: Secondary | ICD-10-CM | POA: Insufficient documentation

## 2020-09-30 DIAGNOSIS — F17210 Nicotine dependence, cigarettes, uncomplicated: Secondary | ICD-10-CM | POA: Diagnosis not present

## 2020-09-30 DIAGNOSIS — J441 Chronic obstructive pulmonary disease with (acute) exacerbation: Secondary | ICD-10-CM | POA: Diagnosis not present

## 2020-09-30 DIAGNOSIS — Z7982 Long term (current) use of aspirin: Secondary | ICD-10-CM | POA: Insufficient documentation

## 2020-09-30 DIAGNOSIS — J45909 Unspecified asthma, uncomplicated: Secondary | ICD-10-CM | POA: Diagnosis not present

## 2020-09-30 DIAGNOSIS — Z748 Other problems related to care provider dependency: Secondary | ICD-10-CM

## 2020-09-30 DIAGNOSIS — T2035XA Burn of third degree of scalp [any part], initial encounter: Secondary | ICD-10-CM | POA: Insufficient documentation

## 2020-09-30 DIAGNOSIS — T300 Burn of unspecified body region, unspecified degree: Secondary | ICD-10-CM

## 2020-09-30 DIAGNOSIS — Z7951 Long term (current) use of inhaled steroids: Secondary | ICD-10-CM | POA: Diagnosis not present
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
## 2020-10-01 MED ORDER — MUPIROCIN CALCIUM 2 % EX CREA
TOPICAL_CREAM | Freq: Once | CUTANEOUS | Status: AC
  Filled 2020-10-01: qty 15

## 2020-10-01 MED ORDER — HYDROCODONE-ACETAMINOPHEN 5-325 MG PO TABS: 1.0000 | ORAL_TABLET | ORAL | 0 refills | Status: DC | PRN

## 2020-10-01 MED ORDER — IBUPROFEN 400 MG PO TABS
600.0000 mg | ORAL_TABLET | Freq: Once | ORAL | Status: AC
  Administered 2020-10-01: 600 mg via ORAL
  Filled 2020-10-01: qty 1

## 2020-10-01 MED ORDER — MUPIROCIN CALCIUM 2 % EX CREA: 1.0000 "application " | TOPICAL_CREAM | Freq: Two times a day (BID) | CUTANEOUS | 0 refills | Status: DC

## 2020-10-01 MED ORDER — HYDROCODONE-ACETAMINOPHEN 5-325 MG PO TABS
1.0000 | ORAL_TABLET | Freq: Once | ORAL | Status: AC
  Administered 2020-10-01: 1 via ORAL
  Filled 2020-10-01: qty 1

## 2020-10-01 MED ORDER — IBUPROFEN 600 MG PO TABS: 600.0000 mg | ORAL_TABLET | Freq: Four times a day (QID) | ORAL | 0 refills | Status: DC | PRN

## 2020-10-01 NOTE — Progress Notes (Signed)
CSW notified that pt is in need for transportation to [facility] on 10/06/20. CSW spoke with pt at bedside to address further needs.  CSW spoke with pt at bedside and was advised that she nor her daughter drives so getting to appointment in [place] would be hard. CSW understanding and offered to set up [facility] in which pt was agreeable to. CSW spoke with [place] with [employer] to set up ride. CSW has faxed over [employer] and Release of Liability to [facility] transportation at this time.  CSW noted in previous notes that [facility] was not able to be established for pt in which pt and daughter were both made aware of.     Claude Manges. Shadd Dunstan, MSW, LCSW [facility] [place] [phone]
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
## 2020-10-01 NOTE — Discharge Instructions (Addendum)
Dressings need to be changed twice a day!  Gauze needs to be non-adherent.  Transportation has been set up for your appointment on 1/10.

## 2020-10-01 NOTE — ED Notes (Signed)
Call the daughter Remus Blake at [phone] with an update please

## 2020-10-01 NOTE — ED Notes (Signed)
Dressing has been applied. Pt states at this time no pain is noted.

## 2020-10-01 NOTE — ED Notes (Addendum)
Theodosia Quay is the daughter and is aware the patient is on her way home. Daughter states she is at the house and will be there when patient arrives.

## 2020-10-01 NOTE — ED Notes (Signed)
Transportation arranged for patient. Pt is A&Ox4. Pt express that is unable to get another ride home. [facility] arranged.

## 2020-10-08 NOTE — Progress Notes (Unsigned)
CSW received call from Mo with [facility] [employer] advising CSW that pt's daugher called to schedule  ride for pt's appointment on 10/20/20. CSW confirmed ride for pt at this time to [facility] on 10/20/20.   Virgie Dad Telisha Zawadzki, MSW, LCSW [facility] [place] [facility] [place] [phone]

## 2021-01-03 IMAGING — CT CT ABD-PELV W/ CM
2 of 5 series · 17 of 46 positions shown, 19 images · IV contrast (APPLIED)
Comparison: CT scan of May 02, 2019.

CLINICAL DATA: Acute generalized abdominal pain.

EXAM:
CT ABDOMEN AND PELVIS WITH CONTRAST
TECHNIQUE: Multidetector CT imaging of the abdomen and pelvis was performed
using the standard protocol following bolus administration of
intravenous contrast.
CONTRAST:  100mL OMNIPAQUE IOHEXOL 300 MG/ML  SOLN

[Series 2: routine abd/pel with · axial · 0.76mm/px · z∈[-487,-132]mm · 14 of 79 slices shown, 16 images]
[im 4/79  soft-tissue]
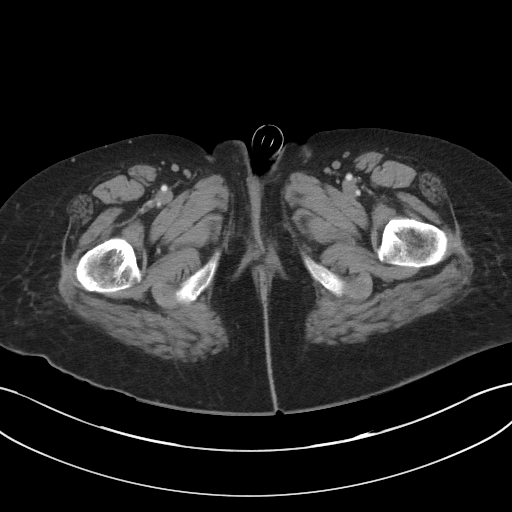
[im 4/79  bone]
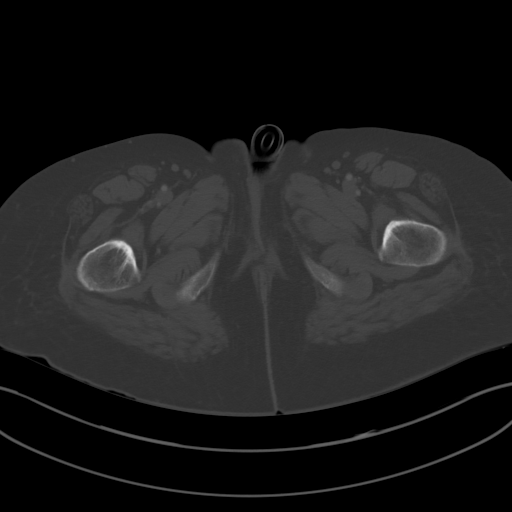
[im 12/79  soft-tissue]
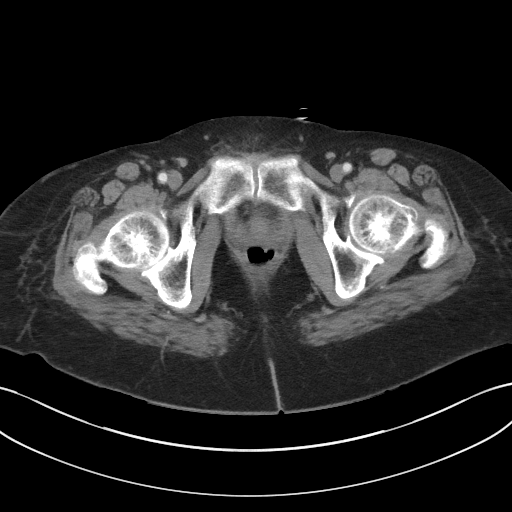
[im 16/79  soft-tissue]
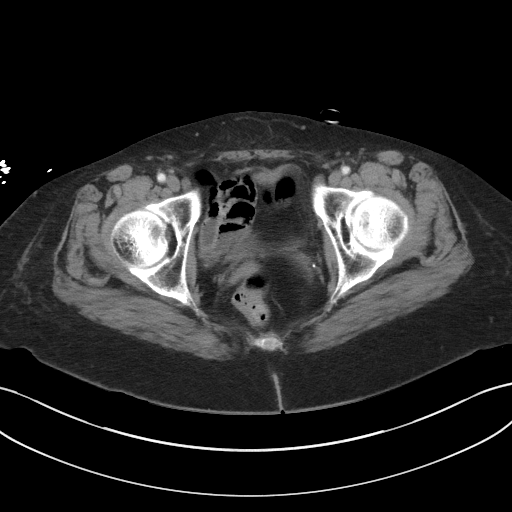
[im 20/79  soft-tissue]
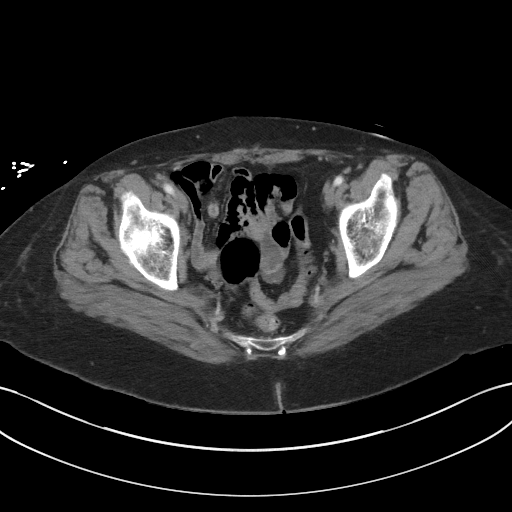
[im 28/79  soft-tissue]
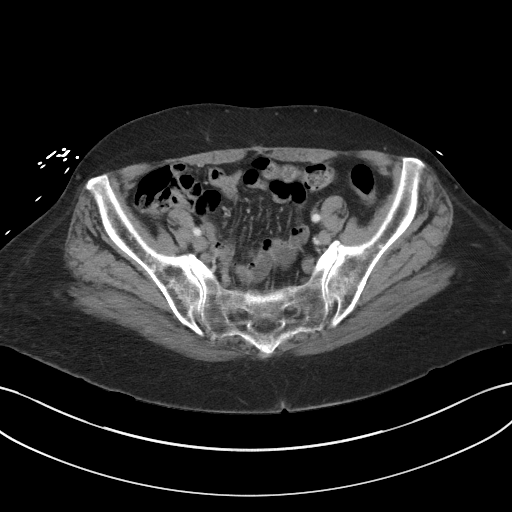
[im 32/79  soft-tissue]
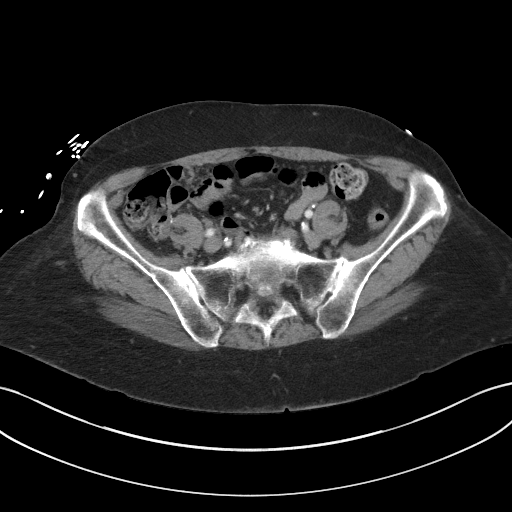
[im 36/79  soft-tissue]
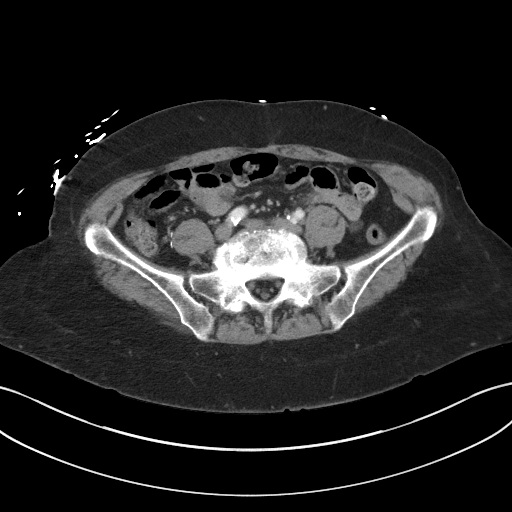
[im 43/79  soft-tissue]
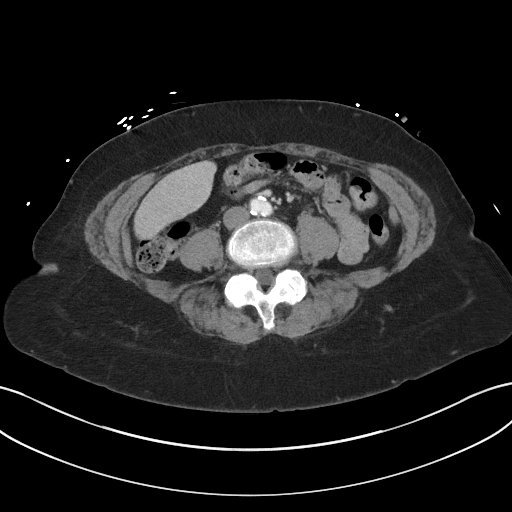
[im 47/79  soft-tissue]
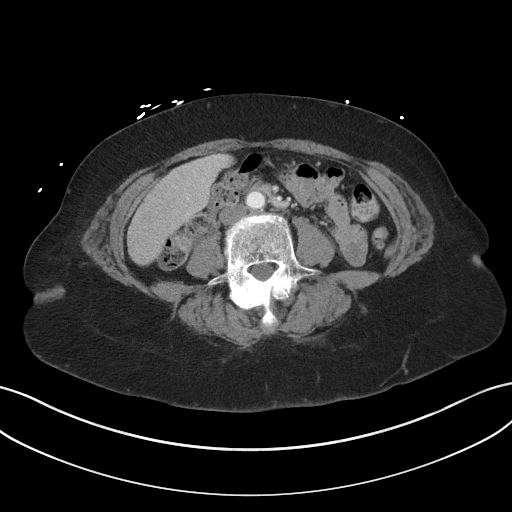
[im 47/79  bone]
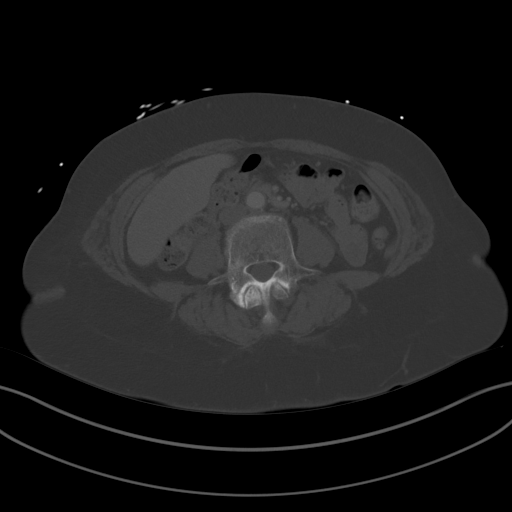
[im 51/79  soft-tissue]
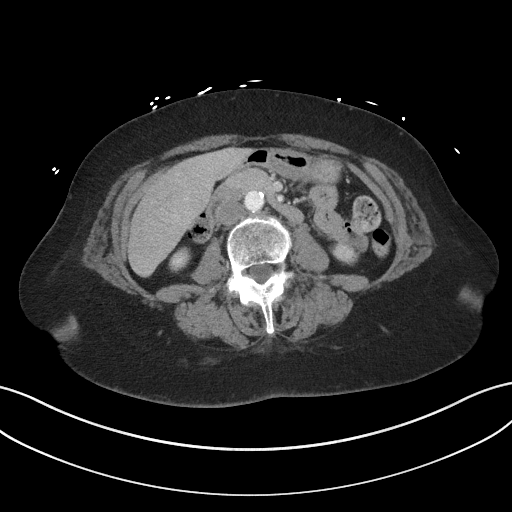
[im 59/79  soft-tissue]
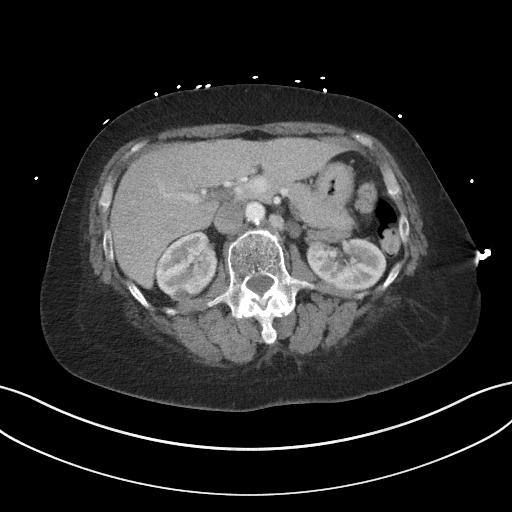
[im 63/79  soft-tissue]
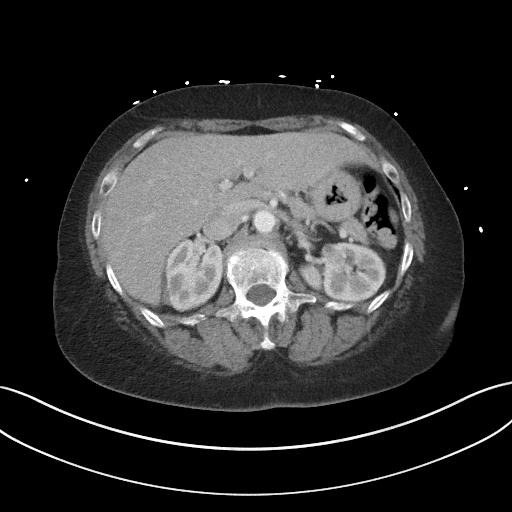
[im 67/79  soft-tissue]
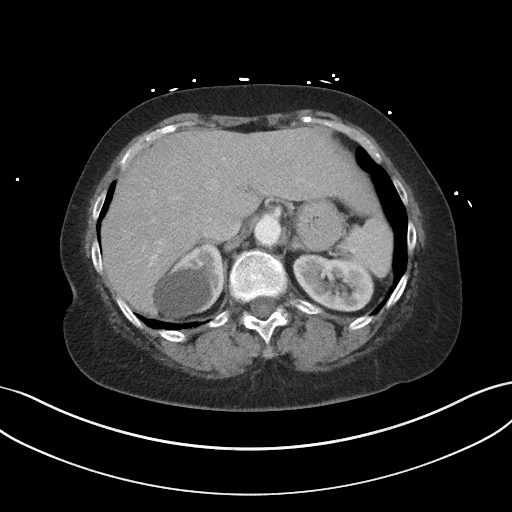
[im 75/79  soft-tissue]
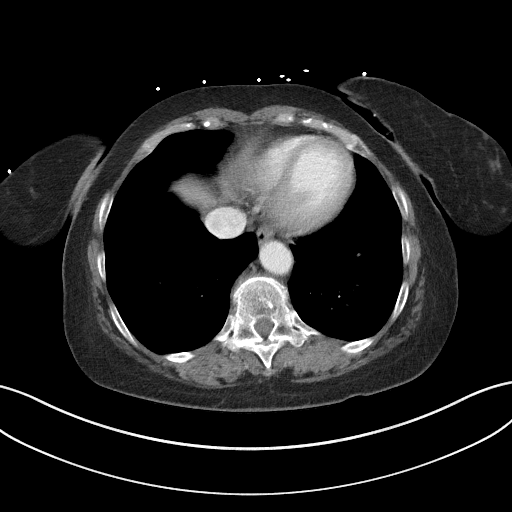

[Series 5: coronal st · coronal · 0.74mm/px · 3 of 80 slices shown]
[im 27/80  soft-tissue]
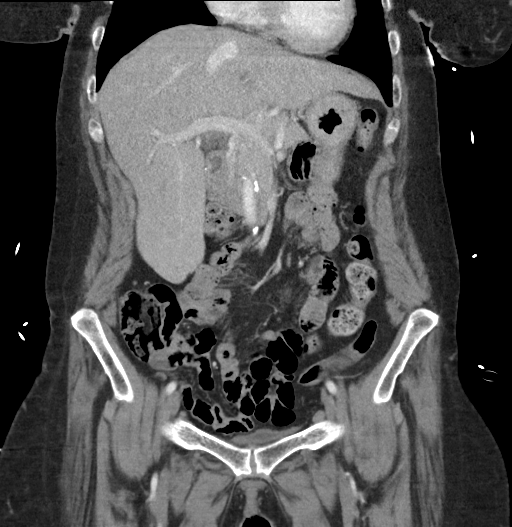
[im 36/80  soft-tissue]
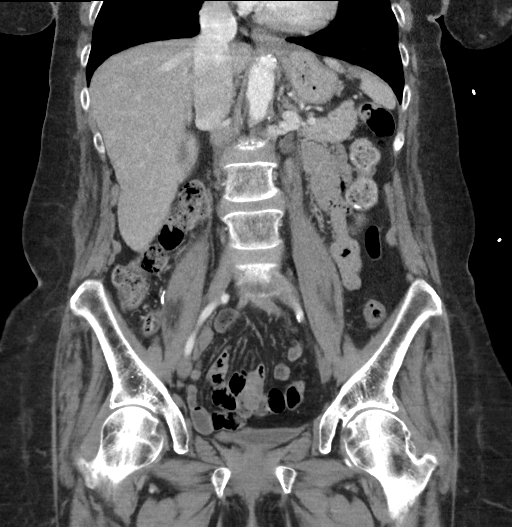
[im 44/80  soft-tissue]
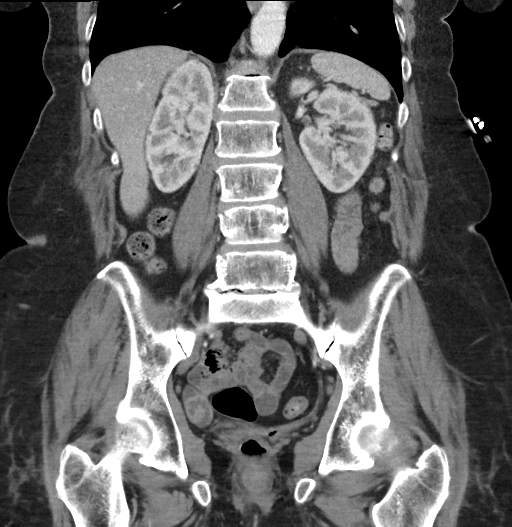

[17 of 46 positions shown; findings below may reference images not displayed]

FINDINGS: Lower chest: No acute abnormality.

Hepatobiliary: No focal liver abnormality is seen. Status post
cholecystectomy. No biliary dilatation.

Pancreas: Unremarkable. No pancreatic ductal dilatation or
surrounding inflammatory changes.

Spleen: Normal in size without focal abnormality.

Adrenals/Urinary Tract: Adrenal glands appear normal. Large right
renal cyst is noted. No hydronephrosis or renal obstruction is
noted. Urinary bladder is decompressed. No renal or ureteral calculi
are noted.

Stomach/Bowel: The stomach appears normal. There is no evidence of
bowel obstruction or inflammation. The appendix is not visualized.

Vascular/Lymphatic: Aortic atherosclerosis. No enlarged abdominal or
pelvic lymph nodes.

Reproductive: Status post hysterectomy. No adnexal masses.

Other: No abdominal wall hernia or abnormality. No abdominopelvic
ascites.

Musculoskeletal: No acute or significant osseous findings.
IMPRESSION: No acute abnormality seen in the abdomen or pelvis.

Aortic Atherosclerosis ([id number]).

## 2021-09-01 ENCOUNTER — Other Ambulatory Visit: Payer: Self-pay | Admitting: Family Medicine

## 2021-09-01 ENCOUNTER — Ambulatory Visit
Admission: RE | Admit: 2021-09-01 | Discharge: 2021-09-01 | Disposition: A | Discharge status: Home / Self Care | Payer: No Typology Code available for payment source | Source: Ambulatory Visit | Attending: Family Medicine | Admitting: Family Medicine

## 2021-09-01 DIAGNOSIS — R52 Pain, unspecified: Secondary | ICD-10-CM

## 2021-09-01 DIAGNOSIS — R1031 Right lower quadrant pain: Secondary | ICD-10-CM

## 2021-09-16 ENCOUNTER — Other Ambulatory Visit: Payer: Self-pay | Admitting: Vascular Surgery

## 2021-09-16 ENCOUNTER — Ambulatory Visit
Admission: RE | Admit: 2021-09-16 | Discharge: 2021-09-16 | Disposition: A | Discharge status: Home / Self Care | Payer: Medicaid Other | Source: Ambulatory Visit | Attending: Vascular Surgery | Admitting: Vascular Surgery

## 2021-09-16 DIAGNOSIS — M25571 Pain in right ankle and joints of right foot: Secondary | ICD-10-CM

## 2021-09-16 DIAGNOSIS — M25551 Pain in right hip: Secondary | ICD-10-CM

## 2021-09-16 DIAGNOSIS — M25561 Pain in right knee: Secondary | ICD-10-CM

## 2021-10-26 ENCOUNTER — Other Ambulatory Visit: Payer: Self-pay | Admitting: Family Medicine

## 2021-10-26 DIAGNOSIS — Z1231 Encounter for screening mammogram for malignant neoplasm of breast: Secondary | ICD-10-CM

## 2021-11-19 ENCOUNTER — Ambulatory Visit
Admission: RE | Admit: 2021-11-19 | Discharge: 2021-11-19 | Disposition: A | Discharge status: Home / Self Care | Payer: Medicaid Other | Source: Ambulatory Visit | Attending: Family Medicine | Admitting: Family Medicine

## 2021-11-19 ENCOUNTER — Other Ambulatory Visit: Payer: Self-pay

## 2021-11-19 DIAGNOSIS — Z1231 Encounter for screening mammogram for malignant neoplasm of breast: Secondary | ICD-10-CM

## 2021-12-07 ENCOUNTER — Encounter: Payer: Self-pay | Admitting: Gastroenterology

## 2022-01-11 ENCOUNTER — Other Ambulatory Visit: Payer: Self-pay | Admitting: Family Medicine

## 2022-01-11 ENCOUNTER — Ambulatory Visit
Admission: RE | Admit: 2022-01-11 | Discharge: 2022-01-11 | Disposition: A | Discharge status: Home / Self Care | Payer: Medicaid Other | Source: Ambulatory Visit | Attending: Family Medicine | Admitting: Family Medicine

## 2022-01-11 DIAGNOSIS — M545 Low back pain, unspecified: Secondary | ICD-10-CM

## 2022-01-12 ENCOUNTER — Ambulatory Visit (INDEPENDENT_AMBULATORY_CARE_PROVIDER_SITE_OTHER): Payer: Medicaid Other | Admitting: Gastroenterology

## 2022-01-12 ENCOUNTER — Encounter: Payer: Self-pay | Admitting: Gastroenterology

## 2022-01-12 VITALS — BP 130/70 | HR 95 | Wt 171.2 lb

## 2022-01-12 DIAGNOSIS — R195 Other fecal abnormalities: Secondary | ICD-10-CM | POA: Diagnosis not present

## 2022-01-12 NOTE — Progress Notes (Signed)
? ?HPI: ?This is a very pleasant 66 year old woman who was sent here by [facility] for "stool test positive for blood."  Her PCP is Dr. Bradd Burner. ? ?I met her very briefly while she was hospitalized in 2019.  There was some vague reports that she might of had some bright red rectal bleeding however none were witnessed by staff and her hemoglobin was nearly normal.  She was Hemoccult negative.  We arranged for outpatient office visit to consider further testing, at least get her up-to-date on colon cancer screening however she never showed for that appointment and we have not heard from her since then. ? ?CT scan abdomen pelvis 08/2021 without IV contrast.  Indication "right lower quadrant abdominal pain and tenderness for 6 days" findings "stable exam, without any significant findings". ? ?She is not a very good historian admittedly.  She asked me to talk to her daughter over the phone which I did.  Her daughter says she has never had colon cancer screening before.  She did have surgery on her stomach for bleeding ulcers about 10 years ago.  She does not think colon cancer runs in the family.  She does not believe her mom has had any overt GI bleeding.  Her weight has been up and down. ? ?She lives at home but gets care [facility] ? ?Review of systems: ?Pertinent positive and negative review of systems were noted in the above HPI section. All other review negative. ? ? ?Past Medical History:  ?Diagnosis Date  ? Arthritis   ? Asthma   ? Drug abuse ([place])   ? recovered  ? GERD (gastroesophageal reflux disease)   ? H/O ETOH abuse   ? HTN (hypertension)   ? Seizure ([place])   ? ? ?Past Surgical History:  ?Procedure Laterality Date  ? ABDOMINAL HYSTERECTOMY    ? GALLBLADDER SURGERY    ? peptic ulcer surgery    ? ? ?Current Outpatient Medications  ?Medication Instructions  ? acetaminophen (TYLENOL) 1,300 mg, Oral, Every 8 hours PRN  ? albuterol (VENTOLIN HFA) 108 (90 Base) MCG/ACT inhaler 2 puffs,  Inhalation, Every 6 hours PRN  ? aspirin EC 81 mg, Oral, Daily  ? Cholecalciferol (VITAMIN D3) 1.25 MG (50000 UT) CAPS 1 capsule, Oral, Every Wed  ? clobetasol (OLUX) 0.05 % topical foam Topical, 2 times daily  ? cyclobenzaprine (FLEXERIL) 5 mg, Oral, 3 times daily  ? divalproex (DEPAKOTE) 500 mg, Oral, 2 times daily  ? fluticasone (FLOVENT HFA) 220 MCG/ACT inhaler 2 puffs, Inhalation, 2 times daily  ? guaiFENesin (MUCINEX) 600 mg, Oral, 2 times daily  ? HYDROcodone-acetaminophen (NORCO/VICODIN) 5-325 MG tablet 1 tablet, Oral, Every 4 hours PRN  ? hydrOXYzine (ATARAX) 25 mg, Oral, 3 times daily PRN  ? ibuprofen (ADVIL) 600 mg, Oral, Every 6 hours PRN  ? levETIRAcetam (KEPPRA) 750 mg, Oral, 2 times daily  ? meloxicam (MOBIC) 7.5 mg, Oral, 2 times daily  ? Multiple Vitamin (MULTIVITAMIN WITH MINERALS) TABS tablet 1 tablet, Oral, Daily  ? mupirocin cream (BACTROBAN) 2 % 1 application., Topical, 2 times daily  ? neomycin-bacitracin-polymyxin (NEOSPORIN) OINT 1 application., Topical, Daily  ? nicotine (NICODERM CQ - DOSED IN MG/24 HOURS) 21 mg, Transdermal, Daily  ? pantoprazole (PROTONIX) 40 mg, Oral, Daily  ? polyethylene glycol (MIRALAX / GLYCOLAX) 17 g, Oral, Daily  ? senna (SENOKOT) 8.6 mg, Oral, Daily  ? Tiotropium Bromide-Olodaterol (STIOLTO RESPIMAT) 2.5-2.5 MCG/ACT AERS 2 puffs, Inhalation, Daily  ? ? ?Allergies  as of 01/12/2022 - Review Complete 01/12/2022  ?Allergen Reaction Noted  ? Kiwi extract Hives and Itching 11/03/2015  ? Morphine and related Hives 11/05/2013  ? Penicillins Hives and Other (See Comments) 02/25/2012  ? Strawberry extract Hives and Itching 04/25/2015  ? ? ?Family History  ?Problem Relation Age of Onset  ? Seizures Mother   ? Alcohol abuse Mother   ? Heart attack Father   ? Drug abuse Brother   ? Pancreatic cancer Brother   ? Cancer Daughter   ?     type unknown  ? Diabetes Daughter   ? Hypertension Daughter   ? ? ?Social History  ? ?Socioeconomic History  ? Marital status: Legally  Separated  ?  Spouse name: Not on file  ? Number of children: 2  ? Years of education: 77  ? Highest education level: Not on file  ?Occupational History  ? Occupation: Disability  ?Tobacco Use  ? Smoking status: Former  ?  Types: Cigarettes  ? Smokeless tobacco: Never  ?Vaping Use  ? Vaping Use: Never used  ?Substance and Sexual Activity  ? Alcohol use: No  ?  Comment: Hx of alchol abuse, not currently drinking  ? Drug use: Not Currently  ?  Types: Marijuana  ?  Comment: "every now and then"  ? Sexual activity: Not on file  ?Other Topics Concern  ? Not on file  ?Social History Narrative  ? Lives at home w/ her daughter  ? Right-handed  ? Caffeine: several coffees and sodas daily  ? ?Social Determinants of Health  ? ?Financial Resource Strain: Not on file  ?Food Insecurity: Not on file  ?Transportation Needs: Not on file  ?Physical Activity: Not on file  ?Stress: Not on file  ?Social Connections: Not on file  ?Intimate Partner Violence: Not on file  ? ? ? ?Physical Exam: ?BP 130/70 (BP Location: Left Arm, Patient Position: Sitting, Cuff Size: Normal)   Pulse 95   Wt 171 lb 4 oz (77.7 kg)   BMI 26.04 kg/m?  ?Constitutional: generally well-appearing ?Psychiatric: alert and oriented x3 ?Eyes: extraocular movements intact ?Mouth: oral pharynx moist, no lesions ?Neck: supple no lymphadenopathy ?Cardiovascular: heart regular rate and rhythm ?Lungs: clear to auscultation bilaterally ?Abdomen: soft, nontender, nondistended, no obvious ascites, no peritoneal signs, normal bowel sounds ?Extremities: no lower extremity edema bilaterally ?Skin: no lesions on visible extremities ? ? ?Assessment and plan: ?66 y.o. female with "stool test positive for blood" ? ?She lives at home but is nonambulatory, gets around in a wheelchair.  She is a poor historian with poor memory, her care partner today is an employee of [employer] and does not know anything more about her than what was written on 2 pieces of paper sent by [facility].  ? ?It does not  sound like she has had any GI symptoms rather she underwent Hemoccult testing for colon cancer screening and it was positive.  I recommended a colonoscopy for further evaluation.  This will have to be done at the hospital endoscopy center since she is not ambulatory and therefore does not qualify for our ambulatory surgical center in this building. ? ?Hopefully she will be able to prep well for this.  We will try to make the instructions very clear for her and her daughter.  I see no reason for further blood tests or imaging studies prior to that. ? ? ?Please see the "Patient Instructions" section for addition details about the plan. ? ? ?Owens Loffler, MD ?[facility] ?  01/12/2022, 1:31 PM ? ?Cc: [name], MD ? ?Total time on date of encounter was 45  minutes (this included time spent preparing to see the patient reviewing records; obtaining and/or reviewing separately obtained history; performing a medically appropriate exam and/or evaluation; counseling and educating the patient and family if present; ordering medications, tests or procedures if applicable; and documenting clinical information in the health record). ? ? ?

## 2022-01-12 NOTE — Patient Instructions (Addendum)
If you are age 66 or younger, your body mass index should be between 19-25. Your Body mass index is 26.04 kg/m?Marland Kitchen If this is out of the aformentioned range listed, please consider follow up with your Primary Care Provider.  ?________________________________________________________ ? ?The [facility] providers would like to encourage you to use [facility] to communicate with providers for non-urgent requests or questions.  Due to long hold times on the telephone, sending your provider a message by [facility] may be a faster and more efficient way to get a response.  Please allow 48 business hours for a response.  Please remember that this is for non-urgent requests.  ?_______________________________________________________ ? ?You have been scheduled for a colonoscopy. Please follow written instructions given to you at your visit today.  ?Please pick up your prep supplies at the pharmacy within the next 1-3 days. ?If you use inhalers (even only as needed), please bring them with you on the day of your procedure. ? ?Due to recent changes in healthcare laws, you may see the results of your imaging and laboratory studies on MyChart before your provider has had a chance to review them.  We understand that in some cases there may be results that are confusing or concerning to you. Not all laboratory results come back in the same time frame and the provider may be waiting for multiple results in order to interpret others.  Please give [place] 48 hours in order for your provider to thoroughly review all the results before contacting the office for clarification of your results.  ? ?Thank you for entrusting me with your care and choosing [facility]. ? ?Dr Ardis Hughs ? ?

## 2022-03-09 ENCOUNTER — Encounter (HOSPITAL_COMMUNITY): Payer: Self-pay | Admitting: Gastroenterology

## 2022-03-18 ENCOUNTER — Ambulatory Visit (HOSPITAL_BASED_OUTPATIENT_CLINIC_OR_DEPARTMENT_OTHER): Payer: Medicaid Other | Admitting: Anesthesiology

## 2022-03-18 ENCOUNTER — Encounter (HOSPITAL_COMMUNITY): Payer: Self-pay | Admitting: Gastroenterology

## 2022-03-18 ENCOUNTER — Other Ambulatory Visit: Payer: Self-pay

## 2022-03-18 ENCOUNTER — Encounter (HOSPITAL_COMMUNITY)
Admission: RE | Disposition: A | Discharge status: Home / Self Care | Payer: Self-pay | Source: Ambulatory Visit | Attending: Gastroenterology

## 2022-03-18 ENCOUNTER — Ambulatory Visit (HOSPITAL_COMMUNITY)
Admission: RE | Admit: 2022-03-18 | Discharge: 2022-03-18 | Disposition: A | Discharge status: Home / Self Care | Payer: Medicaid Other | Source: Ambulatory Visit | Attending: Gastroenterology | Admitting: Gastroenterology

## 2022-03-18 ENCOUNTER — Ambulatory Visit (HOSPITAL_COMMUNITY): Payer: Medicaid Other | Admitting: Anesthesiology

## 2022-03-18 DIAGNOSIS — K219 Gastro-esophageal reflux disease without esophagitis: Secondary | ICD-10-CM | POA: Diagnosis not present

## 2022-03-18 DIAGNOSIS — I10 Essential (primary) hypertension: Secondary | ICD-10-CM

## 2022-03-18 DIAGNOSIS — M1990 Unspecified osteoarthritis, unspecified site: Secondary | ICD-10-CM | POA: Diagnosis not present

## 2022-03-18 DIAGNOSIS — D123 Benign neoplasm of transverse colon: Secondary | ICD-10-CM | POA: Diagnosis not present

## 2022-03-18 DIAGNOSIS — R569 Unspecified convulsions: Secondary | ICD-10-CM | POA: Insufficient documentation

## 2022-03-18 DIAGNOSIS — D122 Benign neoplasm of ascending colon: Secondary | ICD-10-CM

## 2022-03-18 DIAGNOSIS — J449 Chronic obstructive pulmonary disease, unspecified: Secondary | ICD-10-CM

## 2022-03-18 DIAGNOSIS — Z1211 Encounter for screening for malignant neoplasm of colon: Secondary | ICD-10-CM

## 2022-03-18 DIAGNOSIS — Z87891 Personal history of nicotine dependence: Secondary | ICD-10-CM | POA: Insufficient documentation

## 2022-03-18 DIAGNOSIS — K921 Melena: Secondary | ICD-10-CM | POA: Diagnosis present

## 2022-03-18 DIAGNOSIS — K635 Polyp of colon: Secondary | ICD-10-CM | POA: Diagnosis not present

## 2022-03-18 DIAGNOSIS — R195 Other fecal abnormalities: Secondary | ICD-10-CM | POA: Diagnosis not present

## 2022-03-18 DIAGNOSIS — F32A Depression, unspecified: Secondary | ICD-10-CM | POA: Insufficient documentation

## 2022-03-18 HISTORY — PX: COLONOSCOPY WITH PROPOFOL: SHX5780

## 2022-03-18 HISTORY — PX: POLYPECTOMY: SHX5525

## 2022-03-18 SURGERY — COLONOSCOPY WITH PROPOFOL
Anesthesia: Monitor Anesthesia Care

## 2022-03-18 MED ORDER — LACTATED RINGERS IV SOLN: INTRAVENOUS | Status: DC | PRN

## 2022-03-18 MED ORDER — PROPOFOL 10 MG/ML IV BOLUS
INTRAVENOUS | Status: DC | PRN
  Administered 2022-03-18 (×3): 20 mg via INTRAVENOUS

## 2022-03-18 MED ORDER — SODIUM CHLORIDE 0.9 % IV SOLN: INTRAVENOUS | Status: DC

## 2022-03-18 MED ORDER — PROPOFOL 1000 MG/100ML IV EMUL
INTRAVENOUS | Status: AC
  Filled 2022-03-18: qty 100

## 2022-03-18 MED ORDER — PROPOFOL 500 MG/50ML IV EMUL
INTRAVENOUS | Status: DC | PRN
Start: 2022-03-18 — End: 2022-03-18
  Administered 2022-03-18: 150 ug/kg/min via INTRAVENOUS

## 2022-03-18 SURGICAL SUPPLY — 22 items

## 2022-03-18 NOTE — Transfer of Care (Signed)
Immediate Anesthesia Transfer of Care Note  Patient: Lindsey [name]  Procedure(s) Performed: COLONOSCOPY WITH PROPOFOL POLYPECTOMY  Patient Location: PACU  Anesthesia Type:MAC  Level of Consciousness: sedated  Airway & Oxygen Therapy: Patient Spontanous Breathing and Patient connected to face mask oxygen  Post-op Assessment: Report given to RN and Post -op Vital signs reviewed and stable  Post vital signs: Reviewed and stable  Last Vitals:  Vitals Value Taken Time  BP 106/58 03/18/22 1002  Temp    Pulse 83 03/18/22 1003  Resp 17 03/18/22 1003  SpO2 100 % 03/18/22 1003  Vitals shown include unvalidated device data.  Last Pain:  Vitals:   03/18/22 0913  TempSrc: Temporal  PainSc: 0-No pain         Complications: No notable events documented.

## 2022-03-18 NOTE — H&P (Signed)
HPI: This is a woman with heme + stool   ROS: complete GI ROS as described in HPI, all other review negative.  Constitutional:  No unintentional weight loss   Past Medical History:  Diagnosis Date   Arthritis    Asthma    Drug abuse ([place])    recovered   GERD (gastroesophageal reflux disease)    H/O ETOH abuse    HTN (hypertension)    Seizure ([place])     Past Surgical History:  Procedure Laterality Date   ABDOMINAL HYSTERECTOMY     GALLBLADDER SURGERY     peptic ulcer surgery      Current Outpatient Medications  Medication Instructions   acetaminophen (TYLENOL) 500 mg, Oral, 2 times daily PRN   albuterol (VENTOLIN HFA) 108 (90 Base) MCG/ACT inhaler 2 puffs, Inhalation, 2 times daily PRN   cyclobenzaprine (FLEXERIL) 5 mg, Oral, 2 times daily PRN   DIMETHICONE, TOPICAL, (ALOE VESTA DAILY MOISTURIZER) 3 % LOTN 1 Application, Topical, 2 times daily PRN   divalproex (DEPAKOTE ER) 500 mg, Oral, 2 times daily   levETIRAcetam (KEPPRA) 750 mg, Oral, 2 times daily   lidocaine 4 % 1 patch, Transdermal, Daily PRN, Salonpas Maximum Strength   losartan-hydrochlorothiazide (HYZAAR) 100-25 MG tablet 1 tablet, Oral, Daily   pantoprazole (PROTONIX) 40 mg, Oral, Daily   polyethylene glycol (MIRALAX / GLYCOLAX) 17 g, Oral, Daily PRN    Allergies as of 01/12/2022 - Review Complete 01/12/2022  Allergen Reaction Noted   Kiwi extract Hives and Itching 11/03/2015   Morphine and related Hives 11/05/2013   Penicillins Hives and Other (See Comments) 02/25/2012   Strawberry extract Hives and Itching 04/25/2015    Family History  Problem Relation Age of Onset   Seizures Mother    Alcohol abuse Mother    Heart attack Father    Drug abuse Brother    Pancreatic cancer Brother    Cancer Daughter        type unknown   Diabetes Daughter    Hypertension Daughter     Social History   Socioeconomic History   Marital status: Legally Separated    Spouse name: Not on file   Number of  children: 2   Years of education: 12   Highest education level: Not on file  Occupational History   Occupation: Disability  Tobacco Use   Smoking status: Former    Types: Cigarettes   Smokeless tobacco: Never  Vaping Use   Vaping Use: Never used  Substance and Sexual Activity   Alcohol use: No    Comment: Hx of alchol abuse, not currently drinking   Drug use: Not Currently    Types: Marijuana    Comment: "every now and then"   Sexual activity: Not on file  Other Topics Concern   Not on file  Social History Narrative   Lives at home w/ her daughter   Right-handed   Caffeine: several coffees and sodas daily   Social Determinants of Health   Financial Resource Strain: Not on file  Food Insecurity: Not on file  Transportation Needs: Not on file  Physical Activity: Not on file  Stress: Not on file  Social Connections: Not on file  Intimate Partner Violence: Not on file     Physical Exam: BP (!) 167/95   Pulse 91   Temp (!) 97.3 F (36.3 C) (Temporal)   Resp 19   Ht '5\' 8"'$  (1.727 m)   Wt 82.1 kg   SpO2 99%   BMI  27.52 kg/m  Constitutional: generally well-appearing Psychiatric: alert and oriented x3 Abdomen: soft, nontender, nondistended, no obvious ascites, no peritoneal signs, normal bowel sounds No peripheral edema noted in lower extremities  Assessment and plan: 66 y.o. female with heme + stool  For colonoscopy today  Please see the "Patient Instructions" section for addition details about the plan.  Owens Loffler, MD [facility] 03/18/2022, 9:29 AM

## 2022-03-18 NOTE — Discharge Instructions (Signed)
YOU HAD AN ENDOSCOPIC PROCEDURE TODAY: Refer to the procedure report and other information in the discharge instructions given to you for any specific questions about what was found during the examination. If this information does not answer your questions, please call [place] office at [phone] to clarify.  ° °YOU SHOULD EXPECT: Some feelings of bloating in the abdomen. Passage of more gas than usual. Walking can help get rid of the air that was put into your GI tract during the procedure and reduce the bloating. If you had a lower endoscopy (such as a colonoscopy or flexible sigmoidoscopy) you may notice spotting of blood in your stool or on the toilet paper. Some abdominal soreness may be present for a day or two, also. ° °DIET: Your first meal following the procedure should be a light meal and then it is ok to progress to your normal diet. A half-sandwich or bowl of soup is an example of a good first meal. Heavy or fried foods are harder to digest and may make you feel nauseous or bloated. Drink plenty of fluids but you should avoid alcoholic beverages for 24 hours. If you had a esophageal dilation, please see attached instructions for diet.   ° °ACTIVITY: Your care partner should take you home directly after the procedure. You should plan to take it easy, moving slowly for the rest of the day. You can resume normal activity the day after the procedure however YOU SHOULD NOT DRIVE, use power tools, machinery or perform tasks that involve climbing or major physical exertion for 24 hours (because of the sedation medicines used during the test).  ° °SYMPTOMS TO REPORT IMMEDIATELY: °A gastroenterologist can be reached at any hour. Please call [phone]  for any of the following symptoms:  °Following lower endoscopy (colonoscopy, flexible sigmoidoscopy) °Excessive amounts of blood in the stool  °Significant tenderness, worsening of abdominal pains  °Swelling of the abdomen that is new, acute  °Fever of 100° or  higher  °Following upper endoscopy (EGD, EUS, ERCP, esophageal dilation) °Vomiting of blood or coffee ground material  °New, significant abdominal pain  °New, significant chest pain or pain under the shoulder blades  °Painful or persistently difficult swallowing  °New shortness of breath  °Black, tarry-looking or red, bloody stools ° °FOLLOW UP:  °If any biopsies were taken you will be contacted by phone or by letter within the next 1-3 weeks. Call [phone]  if you have not heard about the biopsies in 3 weeks.  °Please also call with any specific questions about appointments or follow up tests. ° °

## 2022-03-18 NOTE — Op Note (Signed)
[facility] Patient Name: Lindsey [name] Procedure Date: 03/18/2022 MRN: [id number] Attending MD: Milus Banister , MD Date of Birth: 03/30/1956 CSN: [id number] Age: 66 Admit Type: Outpatient Procedure:                Colonoscopy Indications:              Heme positive stool Providers:                Milus Banister, MD, Carlyn Reichert, RN, Theodora Blow, Technician Referring MD:              Medicines:                Monitored Anesthesia Care Complications:            No immediate complications. Estimated blood loss:                            None. Estimated Blood Loss:     Estimated blood loss: none. Procedure:                Pre-Anesthesia Assessment:                           - Prior to the procedure, a History and Physical                            was performed, and patient medications and                            allergies were reviewed. The patient's tolerance of                            previous anesthesia was also reviewed. The risks                            and benefits of the procedure and the sedation                            options and risks were discussed with the patient.                            All questions were answered, and informed consent                            was obtained. Prior Anticoagulants: The patient has                            taken no previous anticoagulant or antiplatelet                            agents. ASA Grade Assessment: III - A patient with                            severe systemic disease.  After reviewing the risks                            and benefits, the patient was deemed in                            satisfactory condition to undergo the procedure.                           After obtaining informed consent, the colonoscope                            was passed under direct vision. Throughout the                            procedure, the patient's blood pressure, pulse, and                             oxygen saturations were monitored continuously. The                            CF-HQ190L ([id number]) Olympus colonoscope was                            introduced through the anus and advanced to the the                            cecum, identified by appendiceal orifice and                            ileocecal valve. The colonoscopy was performed                            without difficulty. The patient tolerated the                            procedure well. The quality of the bowel                            preparation was good. The ileocecal valve,                            appendiceal orifice, and rectum were photographed. Scope In: 9:44:45 AM Scope Out: 9:58:33 AM Scope Withdrawal Time: 0 hours 8 minutes 2 seconds  Total Procedure Duration: 0 hours 13 minutes 48 seconds  Findings:      Three pedunculated polyps were found in the transverse colon and       ascending colon. The polyps were 6 to 11 mm in size. These polyps were       removed with a cold snare. Resection and retrieval were complete.      The exam was otherwise without abnormality on direct and retroflexion       views. Impression:               - Three 6 to 11 mm polyps in the transverse colon  and in the ascending colon, removed with a cold                            snare. Resected and retrieved.                           - The examination was otherwise normal on direct                            and retroflexion views. Moderate Sedation:      Not Applicable - Patient had care per Anesthesia. Recommendation:           - Patient has a contact number available for                            emergencies. The signs and symptoms of potential                            delayed complications were discussed with the                            patient. Return to normal activities tomorrow.                            Written discharge instructions were provided to the                             patient.                           - Resume previous diet.                           - Continue present medications.                           - Await pathology results. Procedure Code(s):        --- Professional ---                           [phone], Colonoscopy, flexible; with removal of                            tumor(s), polyp(s), or other lesion(s) by snare                            technique Diagnosis Code(s):        --- Professional ---                           K63.5, Polyp of colon                           R19.5, Other fecal abnormalities CPT copyright 2019 American Medical Association. All rights reserved. The codes documented in this report are preliminary and upon coder review may  be revised to meet current compliance requirements. Quillian Quince  Merrily Brittle, MD 03/18/2022 10:01:26 AM This report has been signed electronically. Number of Addenda: 0

## 2022-03-18 NOTE — Anesthesia Preprocedure Evaluation (Addendum)
Anesthesia Evaluation  Patient identified by MRN, date of birth, ID band Patient awake    Reviewed: Allergy & Precautions, NPO status , Patient's Chart, lab work & pertinent test results  Airway Mallampati: I  TM Distance: >3 FB Neck ROM: Full    Dental  (+) Dental Advisory Given, Edentulous Upper, Edentulous Lower   Pulmonary asthma , COPD, former smoker,    Pulmonary exam normal breath sounds clear to auscultation       Cardiovascular hypertension, Normal cardiovascular exam Rhythm:Regular Rate:Normal     Neuro/Psych Seizures -,  PSYCHIATRIC DISORDERS Depression    GI/Hepatic GERD  Medicated,(+)     substance abuse  alcohol use,   Endo/Other  negative endocrine ROS  Renal/GU negative Renal ROS     Musculoskeletal  (+) Arthritis ,   Abdominal   Peds  Hematology negative hematology ROS (+)   Anesthesia Other Findings   Reproductive/Obstetrics                            Anesthesia Physical Anesthesia Plan  ASA: 3  Anesthesia Plan: MAC   Post-op Pain Management: Minimal or no pain anticipated   Induction: Intravenous  PONV Risk Score and Plan: 2 and Treatment may vary due to age or medical condition and TIVA  Airway Management Planned: Natural Airway and Nasal Cannula  Additional Equipment:   Intra-op Plan:   Post-operative Plan:   Informed Consent: I have reviewed the patients History and Physical, chart, labs and discussed the procedure including the risks, benefits and alternatives for the proposed anesthesia with the patient or authorized representative who has indicated his/her understanding and acceptance.     Dental advisory given  Plan Discussed with: CRNA  Anesthesia Plan Comments:        Anesthesia Quick Evaluation

## 2022-03-19 LAB — SURGICAL PATHOLOGY

## 2022-03-22 ENCOUNTER — Encounter: Payer: Self-pay | Admitting: Gastroenterology

## 2022-04-05 ENCOUNTER — Other Ambulatory Visit: Payer: Self-pay | Admitting: Family Medicine

## 2022-04-05 DIAGNOSIS — Z1231 Encounter for screening mammogram for malignant neoplasm of breast: Secondary | ICD-10-CM

## 2022-05-21 ENCOUNTER — Ambulatory Visit
Admission: RE | Admit: 2022-05-21 | Discharge: 2022-05-21 | Disposition: A | Discharge status: Home / Self Care | Payer: No Typology Code available for payment source | Source: Ambulatory Visit | Attending: Family Medicine | Admitting: Family Medicine

## 2022-05-21 ENCOUNTER — Other Ambulatory Visit: Payer: Self-pay | Admitting: Family Medicine

## 2022-05-21 DIAGNOSIS — M25472 Effusion, left ankle: Secondary | ICD-10-CM

## 2022-06-30 ENCOUNTER — Ambulatory Visit
Admission: RE | Admit: 2022-06-30 | Discharge: 2022-06-30 | Disposition: A | Discharge status: Home / Self Care | Payer: Medicaid Other | Source: Ambulatory Visit | Attending: Family Medicine | Admitting: Family Medicine

## 2022-06-30 ENCOUNTER — Other Ambulatory Visit: Payer: Self-pay | Admitting: Family Medicine

## 2022-06-30 DIAGNOSIS — M545 Low back pain, unspecified: Secondary | ICD-10-CM

## 2022-06-30 DIAGNOSIS — W19XXXA Unspecified fall, initial encounter: Secondary | ICD-10-CM

## 2022-09-07 ENCOUNTER — Emergency Department (HOSPITAL_COMMUNITY): Payer: Medicare (Managed Care)

## 2022-09-07 ENCOUNTER — Inpatient Hospital Stay (HOSPITAL_COMMUNITY)
Admission: EM | Admit: 2022-09-07 | Discharge: 2022-09-10 | DRG: 871 | Disposition: A | Discharge status: Home Health | Payer: Medicare (Managed Care) | Attending: Internal Medicine | Admitting: Internal Medicine

## 2022-09-07 DIAGNOSIS — Z87891 Personal history of nicotine dependence: Secondary | ICD-10-CM

## 2022-09-07 DIAGNOSIS — Z79899 Other long term (current) drug therapy: Secondary | ICD-10-CM

## 2022-09-07 DIAGNOSIS — R0602 Shortness of breath: Secondary | ICD-10-CM

## 2022-09-07 DIAGNOSIS — Z833 Family history of diabetes mellitus: Secondary | ICD-10-CM

## 2022-09-07 DIAGNOSIS — Z800 Family history of malignant neoplasm of digestive organs: Secondary | ICD-10-CM

## 2022-09-07 DIAGNOSIS — J45909 Unspecified asthma, uncomplicated: Secondary | ICD-10-CM | POA: Diagnosis present

## 2022-09-07 DIAGNOSIS — A401 Sepsis due to streptococcus, group B: Secondary | ICD-10-CM | POA: Diagnosis not present

## 2022-09-07 DIAGNOSIS — Z1152 Encounter for screening for COVID-19: Secondary | ICD-10-CM

## 2022-09-07 DIAGNOSIS — J189 Pneumonia, unspecified organism: Secondary | ICD-10-CM | POA: Diagnosis present

## 2022-09-07 DIAGNOSIS — Z8249 Family history of ischemic heart disease and other diseases of the circulatory system: Secondary | ICD-10-CM

## 2022-09-07 DIAGNOSIS — K219 Gastro-esophageal reflux disease without esophagitis: Secondary | ICD-10-CM | POA: Diagnosis present

## 2022-09-07 DIAGNOSIS — I10 Essential (primary) hypertension: Secondary | ICD-10-CM | POA: Diagnosis present

## 2022-09-07 DIAGNOSIS — E872 Acidosis, unspecified: Secondary | ICD-10-CM | POA: Diagnosis present

## 2022-09-07 DIAGNOSIS — J439 Emphysema, unspecified: Secondary | ICD-10-CM | POA: Diagnosis present

## 2022-09-07 DIAGNOSIS — I700 Atherosclerosis of aorta: Secondary | ICD-10-CM | POA: Diagnosis present

## 2022-09-07 DIAGNOSIS — Z9102 Food additives allergy status: Secondary | ICD-10-CM

## 2022-09-07 DIAGNOSIS — F1011 Alcohol abuse, in remission: Secondary | ICD-10-CM | POA: Diagnosis present

## 2022-09-07 DIAGNOSIS — R6520 Severe sepsis without septic shock: Secondary | ICD-10-CM | POA: Diagnosis present

## 2022-09-07 DIAGNOSIS — E876 Hypokalemia: Secondary | ICD-10-CM | POA: Diagnosis present

## 2022-09-07 DIAGNOSIS — Z880 Allergy status to penicillin: Secondary | ICD-10-CM

## 2022-09-07 DIAGNOSIS — Z811 Family history of alcohol abuse and dependence: Secondary | ICD-10-CM

## 2022-09-07 DIAGNOSIS — G40909 Epilepsy, unspecified, not intractable, without status epilepticus: Secondary | ICD-10-CM | POA: Diagnosis present

## 2022-09-07 DIAGNOSIS — Z885 Allergy status to narcotic agent status: Secondary | ICD-10-CM

## 2022-09-07 DIAGNOSIS — G9341 Metabolic encephalopathy: Secondary | ICD-10-CM | POA: Diagnosis present

## 2022-09-07 DIAGNOSIS — Z813 Family history of other psychoactive substance abuse and dependence: Secondary | ICD-10-CM

## 2022-09-07 DIAGNOSIS — A419 Sepsis, unspecified organism: Principal | ICD-10-CM

## 2022-09-07 LAB — COMPREHENSIVE METABOLIC PANEL
ALT: 10 U/L (ref 0–44)
AST: 14 U/L — ABNORMAL LOW (ref 15–41)
Albumin: 3.1 g/dL — ABNORMAL LOW (ref 3.5–5.0)
Alkaline Phosphatase: 86 U/L (ref 38–126)
Anion gap: 12 (ref 5–15)
BUN: 15 mg/dL (ref 8–23)
CO2: 25 mmol/L (ref 22–32)
Calcium: 9.5 mg/dL (ref 8.9–10.3)
Chloride: 105 mmol/L (ref 98–111)
Creatinine, Ser: 0.95 mg/dL (ref 0.44–1.00)
GFR, Estimated: >60 mL/min (ref >60)
Glucose, Bld: 119 mg/dL — ABNORMAL HIGH (ref 70–99)
Potassium: 3.4 mmol/L — ABNORMAL LOW (ref 3.5–5.1)
Sodium: 142 mmol/L (ref 135–145)
Total Bilirubin: 0.5 mg/dL (ref 0.3–1.2)
Total Protein: 8.1 g/dL (ref 6.5–8.1)

## 2022-09-07 LAB — CBC WITH DIFFERENTIAL/PLATELET
Abs Immature Granulocytes: 0.21 10*3/uL — ABNORMAL HIGH (ref 0.00–0.07)
Basophils Absolute: 0.1 10*3/uL (ref 0.0–0.1)
Basophils Relative: 1 %
Eosinophils Absolute: 0 10*3/uL (ref 0.0–0.5)
Eosinophils Relative: 0 %
HCT: 40.7 % (ref 36.0–46.0)
Hemoglobin: 12.8 g/dL (ref 12.0–15.0)
Immature Granulocytes: 1 %
Lymphocytes Relative: 6 %
Lymphs Abs: 1.1 10*3/uL (ref 0.7–4.0)
MCH: 28.6 pg (ref 26.0–34.0)
MCHC: 31.4 g/dL (ref 30.0–36.0)
MCV: 90.8 fL (ref 80.0–100.0)
Monocytes Absolute: 2.4 10*3/uL — ABNORMAL HIGH (ref 0.1–1.0)
Monocytes Relative: 12 %
Neutro Abs: 15.4 10*3/uL — ABNORMAL HIGH (ref 1.7–7.7)
Neutrophils Relative %: 80 %
Platelets: 405 10*3/uL — ABNORMAL HIGH (ref 150–400)
RBC: 4.48 MIL/uL (ref 3.87–5.11)
RDW: 14.1 % (ref 11.5–15.5)
WBC: 19.2 10*3/uL — ABNORMAL HIGH (ref 4.0–10.5)
nRBC: 0 % (ref 0.0–0.2)

## 2022-09-07 LAB — RESP PANEL BY RT-PCR (RSV, FLU A&B, COVID)  RVPGX2
Influenza A by PCR: NEGATIVE
Influenza B by PCR: NEGATIVE
Resp Syncytial Virus by PCR: NEGATIVE
SARS Coronavirus 2 by RT PCR: NEGATIVE

## 2022-09-07 LAB — LACTIC ACID, PLASMA: Lactic Acid, Venous: 2.1 mmol/L (ref 0.5–1.9)

## 2022-09-07 MED ORDER — LACTATED RINGERS IV BOLUS (SEPSIS)
500.0000 mL | Freq: Once | INTRAVENOUS | Status: AC
  Administered 2022-09-08: 500 mL via INTRAVENOUS

## 2022-09-07 MED ORDER — SODIUM CHLORIDE 0.9 % IV SOLN
500.0000 mg | Freq: Every day | INTRAVENOUS | Status: DC
  Administered 2022-09-08 – 2022-09-09 (×3): 500 mg via INTRAVENOUS
  Filled 2022-09-07 (×3): qty 5

## 2022-09-07 MED ORDER — LEVOFLOXACIN IN D5W 750 MG/150ML IV SOLN: 750.0000 mg | Freq: Once | INTRAVENOUS | Status: DC

## 2022-09-07 MED ORDER — LACTATED RINGERS IV BOLUS (SEPSIS)
1000.0000 mL | Freq: Once | INTRAVENOUS | Status: AC
  Administered 2022-09-07: 1000 mL via INTRAVENOUS

## 2022-09-07 MED ORDER — IOHEXOL 300 MG/ML  SOLN
75.0000 mL | Freq: Once | INTRAMUSCULAR | Status: AC | PRN
  Administered 2022-09-07: 75 mL via INTRAVENOUS

## 2022-09-07 MED ORDER — LACTATED RINGERS IV SOLN: INTRAVENOUS | Status: DC

## 2022-09-07 MED ORDER — IPRATROPIUM-ALBUTEROL 0.5-2.5 (3) MG/3ML IN SOLN
3.0000 mL | Freq: Once | RESPIRATORY_TRACT | Status: AC
Start: 2022-09-07 — End: 2022-09-07
  Administered 2022-09-07: 3 mL via RESPIRATORY_TRACT
  Filled 2022-09-07: qty 3

## 2022-09-07 MED ORDER — SODIUM CHLORIDE 0.9 % IV SOLN
1.0000 g | Freq: Every day | INTRAVENOUS | Status: DC
  Administered 2022-09-07 – 2022-09-09 (×3): 1 g via INTRAVENOUS
  Filled 2022-09-07 (×3): qty 10

## 2022-09-07 NOTE — ED Provider Notes (Signed)
[facility] DEPT Provider Note   CSN: [id number] Arrival date & time: 09/07/22  1805     History  Chief Complaint  Patient presents with   Shortness of Breath    Lindsey [name] is a 66 y.o. female, history of asthma, encephalopathy, who presents to the ED secondary to shortness of breath, fever, chills, muscle aches for the last 5 days.  History is limited as patient is confused on exam and believes it is 5 and we are at the dentist office.  Rest of history obtained from daughter.  Daughter states that she waxes and wanes between confusion and knowing where she is, but she typically knows what year it is and who her daughter is but since she has been sick she has been acting abnormal and more confused.  She states that she has been wheezing a lot, and been very short of breath.  They have been giving her Tylenol for her fevers, but she still had fevers.  Was tested for COVID and flu on Monday by her PCP and it was negative.  Daughter denies any recent falls, and states that her mother lives with her.     Home Medications Prior to Admission medications   Medication Sig Start Date End Date Taking? Authorizing Provider  acetaminophen (TYLENOL) 500 MG tablet Take 500 mg by mouth 2 (two) times daily as needed (aches/pains).    [provider]  albuterol (VENTOLIN HFA) 108 (90 Base) MCG/ACT inhaler Inhale 2 puffs into the lungs 2 (two) times daily as needed for wheezing or shortness of breath.    [provider]  cyclobenzaprine (FLEXERIL) 5 MG tablet Take 5 mg by mouth 2 (two) times daily as needed (back spasms.). 06/11/20   [provider]  DIMETHICONE, TOPICAL, (ALOE VESTA DAILY MOISTURIZER) 3 % LOTN Apply 1 Application topically 2 (two) times daily as needed (dry/itchy skin).    [provider]  divalproex (DEPAKOTE ER) 500 MG 24 hr tablet Take 500 mg by mouth in the morning and at bedtime.    [provider]   levETIRAcetam (KEPPRA) 750 MG tablet Take 1 tablet (750 mg total) by mouth 2 (two) times daily. 07/20/19   Virgel Manifold, MD  lidocaine 4 % Place 1 patch onto the skin daily as needed (pain.). Salonpas Maximum Strength    [provider]  losartan-hydrochlorothiazide (HYZAAR) 100-25 MG tablet Take 1 tablet by mouth daily.    [provider]  pantoprazole (PROTONIX) 40 MG tablet Take 40 mg by mouth daily.    [provider]  polyethylene glycol (MIRALAX / GLYCOLAX) 17 g packet Take 17 g by mouth daily as needed (constipation.).    [provider]      Allergies    Kiwi extract, Morphine and related, Penicillins, and Strawberry extract    Review of Systems   Review of Systems  Constitutional:  Positive for chills and fever.  Respiratory:  Positive for shortness of breath.   Cardiovascular:  Negative for chest pain.    Physical Exam Updated Vital Signs BP 118/73 (BP Location: Right Arm)   Pulse (!) 116   Temp 98.9 F (37.2 C) (Oral)   Resp (!) 24   SpO2 94%  Physical Exam Vitals and nursing note reviewed.  Constitutional:      General: She is not in acute distress.    Appearance: She is well-developed.  HENT:     Head: Normocephalic and atraumatic.  Eyes:     Conjunctiva/sclera:  Conjunctivae normal.  Cardiovascular:     Rate and Rhythm: Normal rate and regular rhythm.     Heart sounds: No murmur heard. Pulmonary:     Effort: Pulmonary effort is normal. No respiratory distress.     Comments: Coarse breath sounds diffusely. Abdominal:     Palpations: Abdomen is soft.     Tenderness: There is no abdominal tenderness.  Musculoskeletal:        General: No swelling.     Cervical back: Neck supple.  Skin:    General: Skin is warm and dry.     Capillary Refill: Capillary refill takes less than 2 seconds.  Neurological:     Mental Status: She is disoriented and confused.     Comments: Confused with no neurodeficits.  A x 01.  Psychiatric:         Mood and Affect: Mood normal.     ED Results / Procedures / Treatments   Labs (all labs ordered are listed, but only abnormal results are displayed) Labs Reviewed  COMPREHENSIVE METABOLIC PANEL - Abnormal; Notable for the following components:      Result Value   Potassium 3.4 (*)    Glucose, Bld 119 (*)    Albumin 3.1 (*)    AST 14 (*)    All other components within normal limits  CBC WITH DIFFERENTIAL/PLATELET - Abnormal; Notable for the following components:   WBC 19.2 (*)    Platelets 405 (*)    Neutro Abs 15.4 (*)    Monocytes Absolute 2.4 (*)    Abs Immature Granulocytes 0.21 (*)    All other components within normal limits  RESP PANEL BY RT-PCR (RSV, FLU A&B, COVID)  RVPGX2  CULTURE, BLOOD (ROUTINE X 2)  CULTURE, BLOOD (ROUTINE X 2)  URINALYSIS, ROUTINE W REFLEX MICROSCOPIC  LACTIC ACID, PLASMA  LACTIC ACID, PLASMA    EKG None  Radiology DG Chest 2 View  Result Date: 09/07/2022 CLINICAL DATA:  Shortness of breath, wheezing, fever. EXAM: CHEST - 2 VIEW COMPARISON:  09/09/2020 FINDINGS: Limited by positioning, the patient's chin obscures the left upper lung zone. Additionally the patient is moderately rotated and anti lordotic in positioning. The heart is normal in size. Possible 13 mm right basilar nodule. Peribronchial thickening. No significant pleural effusion. No pneumothorax. Exaggerated thoracic kyphosis. IMPRESSION: 1. Peribronchial thickening which may be bronchitis or asthma. 2. Possible 13 mm right basilar nodule. This is higher than expected for nipple shadow. Consider chest CT for characterization (can be performed on an elective basis based on clinical scenario). 3. Limited exam due to positioning and rotation. Electronically Signed   By: Keith Rake M.D.   On: 09/07/2022 18:54    Procedures Procedures    Medications Ordered in ED Medications  lactated ringers infusion (has no administration in time range)  lactated ringers bolus 1,000 mL  (has no administration in time range)    And  lactated ringers bolus 1,000 mL (has no administration in time range)    And  lactated ringers bolus 500 mL (has no administration in time range)  cefTRIAXone (ROCEPHIN) 1 g in sodium chloride 0.9 % 100 mL IVPB (1 g Intravenous New Bag/Given 09/07/22 2132)  azithromycin (ZITHROMAX) 500 mg in sodium chloride 0.9 % 250 mL IVPB (has no administration in time range)  ipratropium-albuterol (DUONEB) 0.5-2.5 (3) MG/3ML nebulizer solution 3 mL (3 mLs Nebulization Given 09/07/22 1859)    ED Course/ Medical Decision Making/ A&P  Medical Decision Making Patient is a 66 year old female, here for shortness of breath, fever, altered mental status, it has been going on for the last 5 days.  She is notably tachycardic, however afebrile at this time.  We will obtain chest x-ray and blood work to further evaluate.  Give DuoNeb secondary to coarse breath sounds shortness of breath.  I spoke with daughter see HPI above.  Amount and/or Complexity of Data Reviewed Labs: ordered.    Details: Notable white blood cell count of 19,000+, lactic acid ordered and pending. Radiology: ordered.    Details: Chest x-ray shows possible nodule but difficult to tell given nipple placement, will obtain CT chest given respiratory symptoms sepsis. Discussion of management or test interpretation with external provider(s): Concern for upper respiratory infection possible pneumonia causing sepsis.  She is tachycardic, complaint of fever earlier on, and has a white count of 19,000.  We will initiate sepsis protocol including ceftriaxone and azithromycin.  Fluid boluses.  Handoff to [place] PA, pending admission after CT chest.  Risk Prescription drug management.    Final Clinical Impression(s) / ED Diagnoses Final diagnoses:  SOB (shortness of breath)  Sepsis, due to unspecified organism, unspecified whether acute organ dysfunction present [facility])    Rx / DC  Orders ED Discharge Orders     None         Kamila Broda, Si Gaul, PA 09/07/22 2219    Lacretia Leigh, MD 09/10/22 1550

## 2022-09-07 NOTE — ED Triage Notes (Signed)
Ems brings pt in from home for shortness of breath. States the shortness of breath about 2 days ago. Pt also reports cough.

## 2022-09-07 NOTE — ED Notes (Signed)
Attempt x 2 for IV start and blood work.  Unsuccessful at this time.

## 2022-09-07 NOTE — ED Provider Notes (Signed)
  Physical Exam  BP 120/67 (BP Location: Right Arm)   Pulse (!) 111   Temp 98.9 F (37.2 C) (Oral)   Resp (!) 23   SpO2 95%   ED Course / MDM    Medical Decision Making Amount and/or Complexity of Data Reviewed Labs: ordered. Radiology: ordered.  Risk Prescription drug management.  Accepted handoff at shift change from [place] PA-C. Please see prior provider note for full HPI.  Briefly: Patient is a 66 year old female who presents to the ER for shortness of breath and cough x 2 day. Daughter reported change in mentation during this time.  DDX/Plan: Patient meeting sepsis criteria due to tachypnea, tachycardia, leukocytosis of 19.2. Lactic acid 2.1. Sepsis protocol initiated with concern for respiratory source. CXR without significant infectious findings, obtaining follow up CT and will require admission.   I reviewed and interpreted CT chest imaging with opacities in left lower lobe favoring infectious etiology. I agree with the radiologist interpretation.  Consulted with hospitalist Dr Marlowe Sax who will admit. The patient appears reasonably stabilized for admission considering the current resources, flow, and capabilities available in the ED at this time, and I doubt any other [facility] requiring further screening and/or treatment in the ED prior to admission.      [name] 09/07/22 2324    Lacretia Leigh, MD 09/11/22 1537

## 2022-09-07 NOTE — Progress Notes (Signed)
A consult was received from an ED physician for levofloxacin per pharmacy dosing (for an indication other than meningitis). The patient's profile has been reviewed for ht/wt/allergies/indication/available labs. Per Pharmacy protocol; PCN allergy was reviewed and Rocephin + azithromycin was deemed appropriate and ordered.  Further antibiotics/pharmacy consults should be ordered by admitting physician if indicated.                       Reuel Boom, PharmD, BCPS [phone] 09/07/2022, 9:04 PM

## 2022-09-07 NOTE — ED Notes (Signed)
Daughter called requesting an update.  Update provided and all questions answered.

## 2022-09-07 NOTE — Sepsis Progress Note (Signed)
Sepsis protocol is being followed by [facility]. 

## 2022-09-08 DIAGNOSIS — G40909 Epilepsy, unspecified, not intractable, without status epilepticus: Secondary | ICD-10-CM | POA: Diagnosis present

## 2022-09-08 DIAGNOSIS — K219 Gastro-esophageal reflux disease without esophagitis: Secondary | ICD-10-CM | POA: Diagnosis present

## 2022-09-08 DIAGNOSIS — E872 Acidosis, unspecified: Secondary | ICD-10-CM | POA: Diagnosis present

## 2022-09-08 DIAGNOSIS — Z1152 Encounter for screening for COVID-19: Secondary | ICD-10-CM | POA: Diagnosis not present

## 2022-09-08 DIAGNOSIS — F1011 Alcohol abuse, in remission: Secondary | ICD-10-CM | POA: Diagnosis present

## 2022-09-08 DIAGNOSIS — J45909 Unspecified asthma, uncomplicated: Secondary | ICD-10-CM | POA: Diagnosis present

## 2022-09-08 DIAGNOSIS — Z811 Family history of alcohol abuse and dependence: Secondary | ICD-10-CM | POA: Diagnosis not present

## 2022-09-08 DIAGNOSIS — J189 Pneumonia, unspecified organism: Secondary | ICD-10-CM | POA: Diagnosis present

## 2022-09-08 DIAGNOSIS — G9341 Metabolic encephalopathy: Secondary | ICD-10-CM | POA: Diagnosis present

## 2022-09-08 DIAGNOSIS — E876 Hypokalemia: Secondary | ICD-10-CM | POA: Diagnosis present

## 2022-09-08 DIAGNOSIS — Z800 Family history of malignant neoplasm of digestive organs: Secondary | ICD-10-CM | POA: Diagnosis not present

## 2022-09-08 DIAGNOSIS — Z8249 Family history of ischemic heart disease and other diseases of the circulatory system: Secondary | ICD-10-CM | POA: Diagnosis not present

## 2022-09-08 DIAGNOSIS — A401 Sepsis due to streptococcus, group B: Secondary | ICD-10-CM | POA: Diagnosis present

## 2022-09-08 DIAGNOSIS — Z813 Family history of other psychoactive substance abuse and dependence: Secondary | ICD-10-CM | POA: Diagnosis not present

## 2022-09-08 DIAGNOSIS — Z9102 Food additives allergy status: Secondary | ICD-10-CM | POA: Diagnosis not present

## 2022-09-08 DIAGNOSIS — Z833 Family history of diabetes mellitus: Secondary | ICD-10-CM | POA: Diagnosis not present

## 2022-09-08 DIAGNOSIS — I10 Essential (primary) hypertension: Secondary | ICD-10-CM | POA: Diagnosis present

## 2022-09-08 DIAGNOSIS — I700 Atherosclerosis of aorta: Secondary | ICD-10-CM | POA: Diagnosis present

## 2022-09-08 DIAGNOSIS — Z87891 Personal history of nicotine dependence: Secondary | ICD-10-CM | POA: Diagnosis not present

## 2022-09-08 DIAGNOSIS — R6520 Severe sepsis without septic shock: Secondary | ICD-10-CM | POA: Diagnosis present

## 2022-09-08 DIAGNOSIS — Z880 Allergy status to penicillin: Secondary | ICD-10-CM | POA: Diagnosis not present

## 2022-09-08 DIAGNOSIS — R0602 Shortness of breath: Secondary | ICD-10-CM | POA: Diagnosis present

## 2022-09-08 DIAGNOSIS — Z885 Allergy status to narcotic agent status: Secondary | ICD-10-CM | POA: Diagnosis not present

## 2022-09-08 DIAGNOSIS — J439 Emphysema, unspecified: Secondary | ICD-10-CM | POA: Diagnosis present

## 2022-09-08 LAB — URINALYSIS, ROUTINE W REFLEX MICROSCOPIC
Bilirubin Urine: NEGATIVE
Glucose, UA: NEGATIVE mg/dL
Ketones, ur: 20 mg/dL — AB
Leukocytes,Ua: NEGATIVE
Nitrite: NEGATIVE
Protein, ur: >=300 mg/dL — AB
Specific Gravity, Urine: >1.046 — ABNORMAL HIGH (ref 1.005–1.030)
pH: 5 (ref 5.0–8.0)

## 2022-09-08 LAB — BASIC METABOLIC PANEL
Anion gap: 8 (ref 5–15)
BUN: 13 mg/dL (ref 8–23)
CO2: 28 mmol/L (ref 22–32)
Calcium: 8.7 mg/dL — ABNORMAL LOW (ref 8.9–10.3)
Chloride: 107 mmol/L (ref 98–111)
Creatinine, Ser: 0.67 mg/dL (ref 0.44–1.00)
GFR, Estimated: >60 mL/min (ref >60)
Glucose, Bld: 103 mg/dL — ABNORMAL HIGH (ref 70–99)
Potassium: 3.9 mmol/L (ref 3.5–5.1)
Sodium: 143 mmol/L (ref 135–145)

## 2022-09-08 LAB — STREP PNEUMONIAE URINARY ANTIGEN: Strep Pneumo Urinary Antigen: POSITIVE — AB

## 2022-09-08 LAB — CBC
HCT: 33.9 % — ABNORMAL LOW (ref 36.0–46.0)
Hemoglobin: 10.7 g/dL — ABNORMAL LOW (ref 12.0–15.0)
MCH: 28.8 pg (ref 26.0–34.0)
MCHC: 31.6 g/dL (ref 30.0–36.0)
MCV: 91.1 fL (ref 80.0–100.0)
Platelets: 330 10*3/uL (ref 150–400)
RBC: 3.72 MIL/uL — ABNORMAL LOW (ref 3.87–5.11)
RDW: 14.3 % (ref 11.5–15.5)
WBC: 16.7 10*3/uL — ABNORMAL HIGH (ref 4.0–10.5)
nRBC: 0 % (ref 0.0–0.2)

## 2022-09-08 LAB — C DIFFICILE QUICK SCREEN W PCR REFLEX
C Diff antigen: NEGATIVE
C Diff interpretation: NOT DETECTED
C Diff toxin: NEGATIVE

## 2022-09-08 LAB — LACTIC ACID, PLASMA
Lactic Acid, Venous: 2.4 mmol/L (ref 0.5–1.9)
Lactic Acid, Venous: 3 mmol/L (ref 0.5–1.9)

## 2022-09-08 LAB — HIV ANTIBODY (ROUTINE TESTING W REFLEX): HIV Screen 4th Generation wRfx: NONREACTIVE

## 2022-09-08 LAB — MAGNESIUM: Magnesium: 1.6 mg/dL — ABNORMAL LOW (ref 1.7–2.4)

## 2022-09-08 MED ORDER — MUSCLE RUB 10-15 % EX CREA: TOPICAL_CREAM | CUTANEOUS | Status: DC | PRN

## 2022-09-08 MED ORDER — GUAIFENESIN-DM 100-10 MG/5ML PO SYRP
5.0000 mL | ORAL_SOLUTION | ORAL | Status: DC | PRN
  Administered 2022-09-08 – 2022-09-10 (×3): 5 mL via ORAL
  Filled 2022-09-08 (×3): qty 5

## 2022-09-08 MED ORDER — METRONIDAZOLE 500 MG/100ML IV SOLN
500.0000 mg | Freq: Two times a day (BID) | INTRAVENOUS | Status: DC
  Administered 2022-09-08 (×2): 500 mg via INTRAVENOUS
  Filled 2022-09-08 (×2): qty 100

## 2022-09-08 MED ORDER — LACTATED RINGERS IV SOLN: INTRAVENOUS | Status: AC

## 2022-09-08 MED ORDER — ACETAMINOPHEN 325 MG PO TABS
650.0000 mg | ORAL_TABLET | ORAL | Status: DC | PRN
  Administered 2022-09-09 – 2022-09-10 (×2): 650 mg via ORAL
  Filled 2022-09-08 (×2): qty 2

## 2022-09-08 MED ORDER — POLYETHYLENE GLYCOL 3350 17 G PO PACK: 17.0000 g | PACK | Freq: Every day | ORAL | Status: DC | PRN

## 2022-09-08 MED ORDER — PANTOPRAZOLE SODIUM 40 MG PO TBEC
40.0000 mg | DELAYED_RELEASE_TABLET | Freq: Every day | ORAL | Status: DC | PRN
  Administered 2022-09-09 (×2): 40 mg via ORAL
  Filled 2022-09-08 (×2): qty 1

## 2022-09-08 MED ORDER — ACETAMINOPHEN 650 MG RE SUPP: 650.0000 mg | Freq: Four times a day (QID) | RECTAL | Status: DC | PRN

## 2022-09-08 MED ORDER — MENTHOL (TOPICAL ANALGESIC) 4 % EX GEL: 1.0000 "application " | Freq: Three times a day (TID) | CUTANEOUS | Status: DC | PRN

## 2022-09-08 MED ORDER — LOSARTAN POTASSIUM 50 MG PO TABS
100.0000 mg | ORAL_TABLET | Freq: Every day | ORAL | Status: DC
  Administered 2022-09-09 – 2022-09-10 (×2): 100 mg via ORAL
  Filled 2022-09-08 (×2): qty 2

## 2022-09-08 MED ORDER — LACTATED RINGERS IV BOLUS
500.0000 mL | Freq: Once | INTRAVENOUS | Status: AC
  Administered 2022-09-08: 500 mL via INTRAVENOUS

## 2022-09-08 MED ORDER — ACETAMINOPHEN 325 MG PO TABS: 650.0000 mg | ORAL_TABLET | Freq: Four times a day (QID) | ORAL | Status: DC | PRN

## 2022-09-08 MED ORDER — HYDRALAZINE HCL 20 MG/ML IJ SOLN: 10.0000 mg | Freq: Four times a day (QID) | INTRAMUSCULAR | Status: DC | PRN

## 2022-09-08 MED ORDER — ONDANSETRON HCL 4 MG/2ML IJ SOLN
4.0000 mg | Freq: Four times a day (QID) | INTRAMUSCULAR | Status: DC | PRN
  Administered 2022-09-09: 4 mg via INTRAVENOUS
  Filled 2022-09-08: qty 2

## 2022-09-08 MED ORDER — MAGNESIUM SULFATE 2 GM/50ML IV SOLN
2.0000 g | Freq: Once | INTRAVENOUS | Status: AC
  Administered 2022-09-08: 2 g via INTRAVENOUS
  Filled 2022-09-08: qty 50

## 2022-09-08 MED ORDER — LEVETIRACETAM 500 MG PO TABS
750.0000 mg | ORAL_TABLET | Freq: Two times a day (BID) | ORAL | Status: DC
  Administered 2022-09-08 – 2022-09-10 (×4): 750 mg via ORAL
  Filled 2022-09-08 (×5): qty 1

## 2022-09-08 MED ORDER — ENOXAPARIN SODIUM 40 MG/0.4ML IJ SOSY
40.0000 mg | PREFILLED_SYRINGE | INTRAMUSCULAR | Status: DC
  Administered 2022-09-08 – 2022-09-10 (×3): 40 mg via SUBCUTANEOUS
  Filled 2022-09-08 (×3): qty 0.4

## 2022-09-08 MED ORDER — POTASSIUM CHLORIDE 10 MEQ/100ML IV SOLN
10.0000 meq | INTRAVENOUS | Status: AC
  Administered 2022-09-08 (×2): 10 meq via INTRAVENOUS
  Filled 2022-09-08 (×2): qty 100

## 2022-09-08 MED ORDER — ENSURE PLUS HIGH PROTEIN PO LIQD: 237.0000 mL | Freq: Two times a day (BID) | ORAL | Status: DC

## 2022-09-08 MED ORDER — ENSURE MAX PROTEIN PO LIQD
11.0000 [oz_av] | Freq: Two times a day (BID) | ORAL | Status: DC
  Administered 2022-09-08 – 2022-09-10 (×4): 11 [oz_av] via ORAL
  Filled 2022-09-08 (×6): qty 330

## 2022-09-08 MED ORDER — DIVALPROEX SODIUM ER 500 MG PO TB24
500.0000 mg | ORAL_TABLET | Freq: Two times a day (BID) | ORAL | Status: DC
  Administered 2022-09-08 – 2022-09-10 (×4): 500 mg via ORAL
  Filled 2022-09-08 (×4): qty 1

## 2022-09-08 MED ORDER — CYCLOBENZAPRINE HCL 10 MG PO TABS
10.0000 mg | ORAL_TABLET | Freq: Two times a day (BID) | ORAL | Status: DC | PRN
  Administered 2022-09-08: 10 mg via ORAL
  Filled 2022-09-08: qty 1

## 2022-09-08 MED ORDER — IPRATROPIUM-ALBUTEROL 0.5-2.5 (3) MG/3ML IN SOLN
3.0000 mL | Freq: Four times a day (QID) | RESPIRATORY_TRACT | Status: DC | PRN
  Administered 2022-09-08 – 2022-09-09 (×2): 3 mL via RESPIRATORY_TRACT
  Filled 2022-09-08 (×2): qty 3
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
## 2022-09-08 NOTE — Hospital Course (Addendum)
66 year old female with past medical history of asthma, hypertension, seizure disorder, gastroesophageal reflux disease presenting to [facility] emergency department with complaints of shortness of breath cough and fever.  Upon evaluation in the emergency department patient was found to exhibit multiple SIRS criteria including tachycardia, tachypnea and substantial leukocytosis.  Patient was additionally found to have a concurrent lactic acidosis.  CT imaging of the chest revealed a right lower lobe infiltrate concerning for pneumonia.  Patient was initiated on intravenous antibiotics with ceftriaxone and azithromycin and the hospitalist group was then called to assess the patient for admission to the hospital.  Workup revealed Streptococcus pneumoniae antigen positivity.  Blood cultures revealed no evidence of growth however.  Patient was managed with intravenous antibiotics as well as aggressive intravenous volume resuscitation.  In the days that followed patient's SIRS criteria resolved and patient's mentation improved to baseline with improving oral intake.  The patient's lactic acidosis resolved with hydration.  In preparation for discharge, case was discussed with Dr. Lilia Pro patient's primary care provider with [facility].  Prior discussion, Dr. Lilia Pro was to provide the remaining course of the patient's home-going antibiotic therapy and ensure that was delivered directly to the patient's home.  Patient was discharged home on 12/15 in improved and stable condition.

## 2022-09-08 NOTE — ED Notes (Signed)
Patient requesting breathing treatment due to "not being able to get a good breath in."

## 2022-09-08 NOTE — Sepsis Progress Note (Signed)
Notified bedside nurse of need to draw repeat lactic acid. 

## 2022-09-08 NOTE — Assessment & Plan Note (Signed)
Substantial lactic acidosis likely secondary to underlying infection and volume depletion Treatment with intravenous antibiotic therapy and aggressive volume resuscitation Monitoring lactic acid levels to ensure downtrending and resolution

## 2022-09-08 NOTE — Assessment & Plan Note (Signed)
·   Replacing with potassium chloride °· Evaluating for concurrent hypomagnesemia  °· Monitoring potassium levels with serial chemistries. ° °

## 2022-09-08 NOTE — ED Notes (Signed)
ED TO INPATIENT HANDOFF REPORT  Name/Age/Gender Lindsey [name] 66 y.o. female  Code Status    Code Status Orders  (From admission, onward)           Start     Ordered   09/08/22 0137  Full code  Continuous        09/08/22 0141           Code Status History     Date Active Date Inactive Code Status Order ID Comments User Context   09/10/2020 0059 09/14/2020 1845 Full Code [id number]  Vianne Bulls, MD ED   07/07/2019 0948 07/09/2019 2051 Full Code [id number]  Max Sane, MD ED   05/21/2018 2106 05/22/2018 1724 Full Code [id number]  Etta Quill, DO ED   03/26/2018 0257 03/30/2018 1424 Full Code [id number]  Toy Baker, MD Inpatient   05/01/2017 1826 05/03/2017 2230 Full Code [id number]  Vianne Bulls, MD ED   06/25/2016 0259 06/26/2016 2244 Full Code [id number]  [place], [place], DO Inpatient   11/02/2015 1932 11/06/2015 1812 Full Code [id number]  Greta Doom, MD Inpatient   10/10/2015 2059 10/13/2015 1918 Full Code [id number]  Reubin Milan, MD Inpatient   08/15/2015 1525 08/19/2015 1930 Full Code [id number]  [name], MD ED   03/01/2015 2005 03/08/2015 1811 Full Code [id number]  Etta Quill, DO ED   02/06/2015 0603 02/07/2015 1847 Full Code [id number]  Lavina Hamman, MD ED   01/18/2015 1143 01/19/2015 1733 Full Code [id number]  Orson Eva, MD Inpatient   08/16/2014 1700 08/18/2014 1815 Full Code [id number]  Varney Biles, MD ED   03/04/2014 1940 03/06/2014 1528 Full Code [id number]  Barton Dubois, MD Inpatient   11/06/2013 0146 11/09/2013 1355 Full Code [id number]  Berle Mull, MD Inpatient       Home/SNF/Other Home  Chief Complaint Multifocal pneumonia [J18.9]  Level of Care/Admitting Diagnosis ED Disposition     ED Disposition  Admit   Condition  --   [place] [facility] [[id number]]  Level of Care: Telemetry [5]  Admit to tele based on following criteria: Complex arrhythmia (Bradycardia/Tachycardia)   May admit patient to [facility] or [name] if equivalent level of care is available:: Yes  Covid Evaluation: Asymptomatic - no recent exposure (last 10 days) testing not required  Diagnosis: Multifocal pneumonia [5916384]  Admitting Physician: Shela Leff [[id number]]  Attending Physician: Shela Leff [[id number]]  Certification:: I certify this patient will need inpatient services for at least 2 midnights  Estimated Length of Stay: 2          Medical History Past Medical History:  Diagnosis Date   Arthritis    Asthma    Drug abuse ([place])    recovered   GERD (gastroesophageal reflux disease)    H/O ETOH abuse    HTN (hypertension)    Seizure (HCC)     Allergies Allergies  Allergen Reactions   Kiwi Extract Hives and Itching   Morphine And Related Hives   Penicillins Hives and Other (See Comments)    Has patient had a PCN reaction causing immediate rash, facial/tongue/throat swelling, SOB or lightheadedness with hypotension: Yes Has patient had a PCN reaction causing severe rash involving mucus membranes or skin necrosis: No Has patient had a PCN reaction that required hospitalization No Has patient had a PCN reaction occurring within the last 10 years: No If all of the above answers are "NO", then may proceed with Cephalosporin use.   Strawberry  Extract Hives and Itching    IV Location/Drains/Wounds Patient Lines/Drains/Airways Status     Active Line/Drains/Airways     Name Placement date Placement time Site Days   Peripheral IV 09/07/22 22 G 2.5" Left Antecubital 09/07/22  2032  Antecubital  1   Peripheral IV 09/07/22 24 G Posterior;Right Hand 09/07/22  2130  Hand  1   External Urinary Catheter 09/11/20  0130  --  727   Wound / Incision (Open or Dehisced) 09/10/20 Burn  red, blistering, draining, sloughing of skin approximently 7% BSA 09/10/20  0744  --  728            Labs/Imaging Results for orders placed or performed during the hospital  encounter of 09/07/22 (from the past 48 hour(s))  Resp panel by RT-PCR (RSV, Flu A&B, Covid) Anterior Nasal Swab     Status: None   Collection Time: 09/07/22  7:01 PM   Specimen: Anterior Nasal Swab  Result Value Ref Range   SARS Coronavirus 2 by RT PCR NEGATIVE NEGATIVE    Comment: (NOTE) SARS-CoV-2 target nucleic acids are NOT DETECTED.  The SARS-CoV-2 RNA is generally detectable in upper respiratory specimens during the acute phase of infection. The lowest concentration of SARS-CoV-2 viral copies this assay can detect is 138 copies/mL. A negative result does not preclude SARS-Cov-2 infection and should not be used as the sole basis for treatment or other patient management decisions. A negative result may occur with  improper specimen collection/handling, submission of specimen other than nasopharyngeal swab, presence of viral mutation(s) within the areas targeted by this assay, and inadequate number of viral copies(<138 copies/mL). A negative result must be combined with clinical observations, patient history, and epidemiological information. The expected result is Negative.  Fact Sheet for Patients:  EntrepreneurPulse.com.au  Fact Sheet for Healthcare Providers:  IncredibleEmployment.be  This test is no t yet approved or cleared by the [place] FDA and  has been authorized for detection and/or diagnosis of SARS-CoV-2 by FDA under an Emergency Use Authorization (EUA). This EUA will remain  in effect (meaning this test can be used) for the duration of the COVID-19 declaration under Section 564(b)(1) of the Act, 21 U.S.C.section 360bbb-3(b)(1), unless the authorization is terminated  or revoked sooner.       Influenza A by PCR NEGATIVE NEGATIVE   Influenza B by PCR NEGATIVE NEGATIVE    Comment: (NOTE) The Xpert Xpress SARS-CoV-2/FLU/RSV plus assay is intended as an aid in the diagnosis of influenza from Nasopharyngeal swab specimens  and should not be used as a sole basis for treatment. Nasal washings and aspirates are unacceptable for Xpert Xpress SARS-CoV-2/FLU/RSV testing.  Fact Sheet for Patients: EntrepreneurPulse.com.au  Fact Sheet for Healthcare Providers: IncredibleEmployment.be  This test is not yet approved or cleared by the [place] FDA and has been authorized for detection and/or diagnosis of SARS-CoV-2 by FDA under an Emergency Use Authorization (EUA). This EUA will remain in effect (meaning this test can be used) for the duration of the COVID-19 declaration under Section 564(b)(1) of the Act, 21 U.S.C. section 360bbb-3(b)(1), unless the authorization is terminated or revoked.     Resp Syncytial Virus by PCR NEGATIVE NEGATIVE    Comment: (NOTE) Fact Sheet for Patients: EntrepreneurPulse.com.au  Fact Sheet for Healthcare Providers: IncredibleEmployment.be  This test is not yet approved or cleared by the [place] FDA and has been authorized for detection and/or diagnosis of SARS-CoV-2 by FDA under an Emergency Use Authorization (EUA). This EUA will remain in  effect (meaning this test can be used) for the duration of the COVID-19 declaration under Section 564(b)(1) of the Act, 21 U.S.C. section 360bbb-3(b)(1), unless the authorization is terminated or revoked.  Performed at [facility], [place] [address], [place], [place] [address]   Lactic acid, plasma     Status: Abnormal   Collection Time: 09/07/22  8:20 PM  Result Value Ref Range   Lactic Acid, Venous 2.1 (HH) 0.5 - 1.9 mmol/L    Comment: CRITICAL RESULT CALLED TO, READ BACK BY AND VERIFIED WITH TALKINGTON,J RN @ 2224 ON [id number] BY MAHMOUD.S Performed at [facility], [place] [address], [place], [place] [address]   Comprehensive metabolic panel     Status: Abnormal   Collection Time: 09/07/22  8:30 PM  Result Value Ref  Range   Sodium 142 135 - 145 mmol/L   Potassium 3.4 (L) 3.5 - 5.1 mmol/L   Chloride 105 98 - 111 mmol/L   CO2 25 22 - 32 mmol/L   Glucose, Bld 119 (H) 70 - 99 mg/dL    Comment: Glucose reference range applies only to samples taken after fasting for at least 8 hours.   BUN 15 8 - 23 mg/dL   Creatinine, Ser 0.95 0.44 - 1.00 mg/dL   Calcium 9.5 8.9 - 10.3 mg/dL   Total Protein 8.1 6.5 - 8.1 g/dL   Albumin 3.1 (L) 3.5 - 5.0 g/dL   AST 14 (L) 15 - 41 U/L   ALT 10 0 - 44 U/L   Alkaline Phosphatase 86 38 - 126 U/L   Total Bilirubin 0.5 0.3 - 1.2 mg/dL   GFR, Estimated >60 >60 mL/min    Comment: (NOTE) Calculated using the CKD-EPI Creatinine Equation (2021)    Anion gap 12 5 - 15    Comment: Performed at [facility], [place] [address], [place], [place] [address]  CBC with Differential     Status: Abnormal   Collection Time: 09/07/22  8:30 PM  Result Value Ref Range   WBC 19.2 (H) 4.0 - 10.5 K/uL   RBC 4.48 3.87 - 5.11 MIL/uL   Hemoglobin 12.8 12.0 - 15.0 g/dL   HCT 40.7 36.0 - 46.0 %   MCV 90.8 80.0 - 100.0 fL   MCH 28.6 26.0 - 34.0 pg   MCHC 31.4 30.0 - 36.0 g/dL   RDW 14.1 11.5 - 15.5 %   Platelets 405 (H) 150 - 400 K/uL   nRBC 0.0 0.0 - 0.2 %   Neutrophils Relative % 80 %   Neutro Abs 15.4 (H) 1.7 - 7.7 K/uL   Lymphocytes Relative 6 %   Lymphs Abs 1.1 0.7 - 4.0 K/uL   Monocytes Relative 12 %   Monocytes Absolute 2.4 (H) 0.1 - 1.0 K/uL   Eosinophils Relative 0 %   Eosinophils Absolute 0.0 0.0 - 0.5 K/uL   Basophils Relative 1 %   Basophils Absolute 0.1 0.0 - 0.1 K/uL   Immature Granulocytes 1 %   Abs Immature Granulocytes 0.21 (H) 0.00 - 0.07 K/uL    Comment: Performed at [facility], [place] [address] [place], [place] [address]  Urinalysis, Routine w reflex microscopic Urine, Clean Catch     Status: Abnormal   Collection Time: 09/08/22 12:14 AM  Result Value Ref Range   Color, Urine YELLOW YELLOW   APPearance CLEAR CLEAR   Specific  Gravity, Urine >1.046 (H) 1.005 - 1.030   pH 5.0 5.0 - 8.0  Glucose, UA NEGATIVE NEGATIVE mg/dL   Hgb urine dipstick SMALL (A) NEGATIVE   Bilirubin Urine NEGATIVE NEGATIVE   Ketones, ur 20 (A) NEGATIVE mg/dL   Protein, ur >=300 (A) NEGATIVE mg/dL   Nitrite NEGATIVE NEGATIVE   Leukocytes,Ua NEGATIVE NEGATIVE   RBC / HPF 11-20 0 - 5 RBC/hpf   WBC, UA 6-10 0 - 5 WBC/hpf   Bacteria, UA RARE (A) NONE SEEN   Squamous Epithelial / LPF 0-5 0 - 5   Mucus PRESENT     Comment: Performed at [facility], [place] [address] [place], [place] [address]  Strep pneumoniae urinary antigen     Status: Abnormal   Collection Time: 09/08/22 12:14 AM  Result Value Ref Range   Strep Pneumo Urinary Antigen POSITIVE (A) NEGATIVE    Comment: RESULTS CALLED TO READ BACK BY AND VERIFIED WITH RN T.Weda Baumgarner '@[id number]'$  ON 09/08/2022 BY NM Performed at [facility], [place] [address] [place], [place] [address]   Lactic acid, plasma     Status: Abnormal   Collection Time: 09/08/22  1:41 AM  Result Value Ref Range   Lactic Acid, Venous 2.4 (HH) 0.5 - 1.9 mmol/L    Comment: CRITICAL VALUE NOTED. VALUE IS CONSISTENT WITH PREVIOUSLY REPORTED/CALLED VALUE Performed at [facility] [address] [place], [place] [address]   CBC     Status: Abnormal   Collection Time: 09/08/22  6:15 AM  Result Value Ref Range   WBC 16.7 (H) 4.0 - 10.5 K/uL   RBC 3.72 (L) 3.87 - 5.11 MIL/uL   Hemoglobin 10.7 (L) 12.0 - 15.0 g/dL   HCT 33.9 (L) 36.0 - 46.0 %   MCV 91.1 80.0 - 100.0 fL   MCH 28.8 26.0 - 34.0 pg   MCHC 31.6 30.0 - 36.0 g/dL   RDW 14.3 11.5 - 15.5 %   Platelets 330 150 - 400 K/uL   nRBC 0.0 0.0 - 0.2 %    Comment: Performed at [facility], [place] [address], [place], [place] [address]   CT Chest W Contrast  Result Date: 09/07/2022 CLINICAL DATA:  Sepsis. EXAM: CT CHEST WITH CONTRAST TECHNIQUE: Multidetector CT imaging of the chest was performed during intravenous  contrast administration. RADIATION DOSE REDUCTION: This exam was performed according to the departmental dose-optimization program which includes automated exposure control, adjustment of the mA and/or kV according to patient size and/or use of iterative reconstruction technique. CONTRAST:  51m OMNIPAQUE IOHEXOL 300 MG/ML  SOLN COMPARISON:  Chest radiograph earlier today. FINDINGS: Cardiovascular: The heart is normal in size. Moderate atherosclerosis of the thoracic aorta. No aneurysm. No evidence of acute aortic finding on this non CTA exam. No pericardial effusion. No central pulmonary embolus. Mediastinum/Nodes: No adenopathy. Patulous esophagus without wall thickening. Small right axillary and subpectoral lymph nodes, all subcentimeter short axis. Lungs/Pleura: Breathing motion artifact limits assessment, particularly at the lung bases. Mild apical emphysema. Prominent central bronchial thickening. Mucoid impaction of the left lower lobe. Minimal ground-glass opacity in the right upper lobe abuts the fissure. Dependent opacity in the right lower lobe which is somewhat nodular in appearance and may account for radiographic appearance, series 5, images 90-92. Peribronchovascular opacity in the dependent left lower lobe. Trachea and central airways are patent. Upper Abdomen: Multiple bilateral renal cysts, no further follow-up is needed. No acute upper abdominal findings. Musculoskeletal: There are no acute or suspicious osseous abnormalities. Exaggerated thoracic kyphosis. No chest wall soft tissue abnormalities. IMPRESSION: 1. Dependent airspace  opacity in the right lower lobe which is somewhat nodular in appearance and likely accounts for radiographic appearance. This may represent pneumonia or aspiration. 2. Additional peribronchovascular opacities in the dependent left lower lobe. Minimal patchy ground-glass opacity in the perifissural right upper lobe. Favor infectious etiology. 3. Prominent central  bronchial thickening, can be seen with bronchitis or reactive airways disease. Aortic Atherosclerosis (ICD10-I70.0) and Emphysema (ICD10-J43.9). Electronically Signed   By: Keith Rake M.D.   On: 09/07/2022 22:38   DG Chest 2 View  Result Date: 09/07/2022 CLINICAL DATA:  Shortness of breath, wheezing, fever. EXAM: CHEST - 2 VIEW COMPARISON:  09/09/2020 FINDINGS: Limited by positioning, the patient's chin obscures the left upper lung zone. Additionally the patient is moderately rotated and anti lordotic in positioning. The heart is normal in size. Possible 13 mm right basilar nodule. Peribronchial thickening. No significant pleural effusion. No pneumothorax. Exaggerated thoracic kyphosis. IMPRESSION: 1. Peribronchial thickening which may be bronchitis or asthma. 2. Possible 13 mm right basilar nodule. This is higher than expected for nipple shadow. Consider chest CT for characterization (can be performed on an elective basis based on clinical scenario). 3. Limited exam due to positioning and rotation. Electronically Signed   By: Keith Rake M.D.   On: 09/07/2022 18:54    Pending Labs Unresulted Labs (From admission, onward)     Start     Ordered   09/08/22 0500  Magnesium  Tomorrow morning,   R        09/08/22 0141   09/08/22 3329  Basic metabolic panel  Tomorrow morning,   R        09/08/22 0148   09/08/22 0142  Legionella Pneumophila Serogp 1 Ur Ag  Once,   R        09/08/22 0141   09/08/22 0142  Surgical pcr screen  Once,   R        09/08/22 0141   09/08/22 0142  Expectorated Sputum Assessment w Gram Stain, Rflx to Resp Cult  Once,   R        09/08/22 0141   09/08/22 0137  HIV Antibody (routine testing w rflx)  (HIV Antibody (Routine testing w reflex) panel)  Once,   R        09/08/22 0141   09/07/22 1828  Blood culture (routine x 2)  BLOOD CULTURE X 2,   R (with STAT occurrences)      09/07/22 1828            Vitals/Pain Today's Vitals   09/08/22 0430 09/08/22 0528  09/08/22 0545 09/08/22 0600  BP: (!) 141/73   136/73  Pulse: 97  97   Resp: (!) 36  (!) 24 (!) 26  Temp:  97.6 F (36.4 C)    TempSrc:  Oral    SpO2: 96%  97%     Isolation Precautions No active isolations  Medications Medications  cefTRIAXone (ROCEPHIN) 1 g in sodium chloride 0.9 % 100 mL IVPB (0 g Intravenous Stopped 09/07/22 2244)  azithromycin (ZITHROMAX) 500 mg in sodium chloride 0.9 % 250 mL IVPB (0 mg Intravenous Stopped 09/08/22 0132)  ipratropium-albuterol (DUONEB) 0.5-2.5 (3) MG/3ML nebulizer solution 3 mL (3 mLs Nebulization Given 09/08/22 5188)  lactated ringers infusion ( Intravenous New Bag/Given 09/08/22 0254)  enoxaparin (LOVENOX) injection 40 mg (has no administration in time range)  acetaminophen (TYLENOL) tablet 650 mg (has no administration in time range)    Or  acetaminophen (TYLENOL) suppository 650 mg (has no administration in  time range)  metroNIDAZOLE (FLAGYL) IVPB 500 mg (500 mg Intravenous New Bag/Given 09/08/22 0556)  ipratropium-albuterol (DUONEB) 0.5-2.5 (3) MG/3ML nebulizer solution 3 mL (3 mLs Nebulization Given 09/07/22 1859)  lactated ringers bolus 1,000 mL (0 mLs Intravenous Stopped 09/08/22 0012)    And  lactated ringers bolus 1,000 mL (0 mLs Intravenous Stopped 09/08/22 0012)    And  lactated ringers bolus 500 mL (0 mLs Intravenous Stopped 09/08/22 0253)  iohexol (OMNIPAQUE) 300 MG/ML solution 75 mL (75 mLs Intravenous Contrast Given 09/07/22 2219)  potassium chloride 10 mEq in 100 mL IVPB (0 mEq Intravenous Stopped 09/08/22 0534)    Mobility walks with device

## 2022-09-08 NOTE — Assessment & Plan Note (Addendum)
Supplemental oxygen for bouts of hypoxia  bronchodilator therapy for bouts of shortness of breath and wheezing Please see assessment and plan above

## 2022-09-08 NOTE — Progress Notes (Signed)
PT Cancellation Note  Patient Details Name: Lindsey [name] MRN: [id number] DOB: April 20, 1956   Cancelled Treatment:    Reason Eval/Treat Not Completed: Other (comment), nursing with patient. Will check back  another time. [place] Office [phone] Weekend [phone]    Claretha Cooper 09/08/2022, 4:45 PM

## 2022-09-08 NOTE — Progress Notes (Signed)
PROGRESS NOTE   Lindsey [name]  WJX:[id number] DOB: Apr 10, 1956 DOA: 09/07/2022 PCP: [name], MD   Date of Service: the patient was seen and examined on 09/08/2022  Brief Narrative:  66 year old female with past medical history of asthma, hypertension, seizure disorder, gastroesophageal reflux disease presenting to [facility] emergency department with complaints of shortness of breath cough and fever.  Upon evaluation in the emergency department patient was found to exhibit multiple SIRS criteria including tachycardia, tachypnea and substantial leukocytosis.  Patient was additionally found to have a concurrent lactic acidosis.  CT imaging of the chest revealed a right lower lobe infiltrate concerning for pneumonia.  Patient was initiated on intravenous antibiotics with ceftriaxone and azithromycin and the hospitalist group was then called to assess the patient for admission to the hospital.     Assessment and Plan: * Sepsis due to group B Streptococcus with encephalopathy without septic shock ([place]) CT imaging of the chest on presentation revealing infiltrates in the right lower lobe, left lower lobe and right upper lobe. Continuing treatment of ceftriaxone and azithromycin Strep pneumoniae urinary antigen positive Blood cultures revealing no growth at this point Hydrating patient aggressively particularly due to concurrent lactic acidosis  Multifocal pneumonia Supplemental oxygen for bouts of hypoxia  bronchodilator therapy for bouts of shortness of breath and wheezing Please see assessment and plan above  Lactic acidosis Substantial lactic acidosis likely secondary to underlying infection and volume depletion Treatment with intravenous antibiotic therapy and aggressive volume resuscitation Monitoring lactic acid levels to ensure downtrending and resolution  Acute metabolic encephalopathy Likely secondary to underlying infection Treating underlying infection  with antibiotics and intravenous fluids If encephalopathy fails to clinically improve will expand workup  Hypomagnesemia Replacing with intravenous magnesium sulfate Monitoring magnesium levels with serial chemistries.   Hypokalemia Replacing with potassium chloride Evaluating for concurrent hypomagnesemia  Monitoring potassium levels with serial chemistries.         Subjective:  Patient complaining of intermittent abdominal pain, worse with deep inspiration and cough.  This is associated with ongoing nausea and poor oral intake.  Physical Exam:  Vitals:   09/08/22 0851 09/08/22 1243 09/08/22 1651 09/08/22 2033  BP: (!) 147/72 (!) 143/74 (!) 147/71 (!) 146/74  Pulse: 94 99 (!) 103 (!) 110  Resp: '18 18 18 18  '$ Temp: 97.8 F (36.6 C) 98 F (36.7 C) 97.7 F (36.5 C) 97.7 F (36.5 C)  TempSrc: Axillary Oral Oral Oral  SpO2: 95% 95% 95% 96%    Constitutional: Patient is lethargic with as well and oriented x 2, patient is a distress due to mild abdominal pain. Skin: no rashes, no lesions, poor skin turgor noted. Eyes: Pupils are equally reactive to light.  No evidence of scleral icterus or conjunctival pallor.  ENMT: Dry mucous membranes noted.  Posterior pharynx clear of any exudate or lesions.   Respiratory: Significant rales in the bilateral lower fields without any evidence of concurrent wheezing.  Normal respiratory effort. No accessory muscle use.  Cardiovascular: Regular rate and rhythm, no murmurs / rubs / gallops. No extremity edema. 2+ pedal pulses. No carotid bruits.  Abdomen: Notable generalized Donnell tenderness.  Abdomen is soft.  No evidence of intra-abdominal masses.  Positive bowel sounds noted in all quadrants.   Musculoskeletal: No joint deformity upper and lower extremities. Good ROM, no contractures. Normal muscle tone.    Data Reviewed:  I have personally reviewed and interpreted labs, imaging.  Significant findings are   CBC: Recent Labs  Lab  09/07/22  2030 09/08/22 0615  WBC 19.2* 16.7*  NEUTROABS 15.4*  --   HGB 12.8 10.7*  HCT 40.7 33.9*  MCV 90.8 91.1  PLT 405* 628   Basic Metabolic Panel: Recent Labs  Lab 09/07/22 2030 09/08/22 0615 09/08/22 0616  NA 142  --  143  K 3.4*  --  3.9  CL 105  --  107  CO2 25  --  28  GLUCOSE 119*  --  103*  BUN 15  --  13  CREATININE 0.95  --  0.67  CALCIUM 9.5  --  8.7*  MG  --  1.6*  --    GFR: CrCl cannot be calculated (Unknown ideal weight.). Liver Function Tests: Recent Labs  Lab 09/07/22 2030  AST 14*  ALT 10  ALKPHOS 86  BILITOT 0.5  PROT 8.1  ALBUMIN 3.1*    Code Status:  Full code.  Code status decision has been confirmed with: patient    Severity of Illness:  The appropriate patient status for this patient is INPATIENT. Inpatient status is judged to be reasonable and necessary in order to provide the required intensity of service to ensure the patient's safety. The patient's presenting symptoms, physical exam findings, and initial radiographic and laboratory data in the context of their chronic comorbidities is felt to place them at high risk for further clinical deterioration. Furthermore, it is not anticipated that the patient will be medically stable for discharge from the hospital within 2 midnights of admission.   * I certify that at the point of admission it is my clinical judgment that the patient will require inpatient hospital care spanning beyond 2 midnights from the point of admission due to high intensity of service, high risk for further deterioration and high frequency of surveillance required.*  Time spent:  59 minutes  Author:  Vernelle Emerald MD  09/08/2022 8:44 PM

## 2022-09-08 NOTE — H&P (Signed)
History and Physical    Lindsey [name] RRN:[id number] DOB: [birth year] DOA: 09/07/2022  PCP: [name], MD  Patient coming from: Home  Chief Complaint: Shortness of breath  HPI: Lindsey [name] is a 66 y.o. female with medical history significant of asthma, hypertension, seizure disorder, drug and alcohol abuse in remission, GERD presented to ED with shortness of breath, cough, fever, and confusion.  On arrival to the ED, patient was tachycardic to the 130s, tachypneic to the 20s.  Afebrile and not hypotensive or hypoxic.  Labs showing WBC count 19.2, potassium 3.4, lactic acid 2.1, COVID and influenza PCR negative, blood cultures drawn, UA not suggestive of infection.  CT chest with contrast showing: "IMPRESSION: 1. Dependent airspace opacity in the right lower lobe which is somewhat nodular in appearance and likely accounts for radiographic appearance. This may represent pneumonia or aspiration. 2. Additional peribronchovascular opacities in the dependent left lower lobe. Minimal patchy ground-glass opacity in the perifissural right upper lobe. Favor infectious etiology. 3. Prominent central bronchial thickening, can be seen with bronchitis or reactive airways disease.   Aortic Atherosclerosis (ICD10-I70.0) and Emphysema (ICD10-J43.9)."  Patient was given DuoNeb treatment, ceftriaxone, azithromycin, and 2.5 L IV fluid boluses.  [facility] called to admit.  Patient states she has been feeling poorly for the past 5 days.  She is endorsing cough, shortness of breath, fevers, chills, fatigue, and poor appetite.  States her daughter has cancer and cannot come into the hospital so she called EMS.  She denies vomiting, diarrhea, or abdominal pain.  No other complaints.  Review of Systems:  Review of Systems  All other systems reviewed and are negative.   Past Medical History:  Diagnosis Date   Arthritis    Asthma    Drug abuse ([place])    recovered   GERD (gastroesophageal reflux  disease)    H/O ETOH abuse    HTN (hypertension)    Seizure ([place])     Past Surgical History:  Procedure Laterality Date   ABDOMINAL HYSTERECTOMY     COLONOSCOPY WITH PROPOFOL N/A 03/18/2022   Procedure: COLONOSCOPY WITH PROPOFOL;  Surgeon: Milus Banister, MD;  Location: [facility];  Service: Gastroenterology;  Laterality: N/A;   GALLBLADDER SURGERY     peptic ulcer surgery     POLYPECTOMY  03/18/2022   Procedure: POLYPECTOMY;  Surgeon: Milus Banister, MD;  Location: [facility];  Service: Gastroenterology;;     reports that she has quit smoking. Her smoking use included cigarettes. She has never used smokeless tobacco. She reports that she does not currently use drugs after having used the following drugs: Marijuana. She reports that she does not drink alcohol.  Allergies  Allergen Reactions   Kiwi Extract Hives and Itching   Morphine And Related Hives   Penicillins Hives and Other (See Comments)    Has patient had a PCN reaction causing immediate rash, facial/tongue/throat swelling, SOB or lightheadedness with hypotension: Yes Has patient had a PCN reaction causing severe rash involving mucus membranes or skin necrosis: No Has patient had a PCN reaction that required hospitalization No Has patient had a PCN reaction occurring within the last 10 years: No If all of the above answers are "NO", then may proceed with Cephalosporin use.   Strawberry Extract Hives and Itching    Family History  Problem Relation Age of Onset   Seizures Mother    Alcohol abuse Mother    Heart attack Father    Drug abuse Brother    Pancreatic cancer Brother  Cancer Daughter        type unknown   Diabetes Daughter    Hypertension Daughter     Prior to Admission medications   Medication Sig Start Date End Date Taking? Authorizing Provider  acetaminophen (TYLENOL) 500 MG tablet Take 500 mg by mouth 2 (two) times daily as needed (aches/pains).    [provider]  albuterol  (VENTOLIN HFA) 108 (90 Base) MCG/ACT inhaler Inhale 2 puffs into the lungs 2 (two) times daily as needed for wheezing or shortness of breath.    [provider]  cyclobenzaprine (FLEXERIL) 5 MG tablet Take 5 mg by mouth 2 (two) times daily as needed (back spasms.). 06/11/20   [provider]  DIMETHICONE, TOPICAL, (ALOE VESTA DAILY MOISTURIZER) 3 % LOTN Apply 1 Application topically 2 (two) times daily as needed (dry/itchy skin).    [provider]  divalproex (DEPAKOTE ER) 500 MG 24 hr tablet Take 500 mg by mouth in the morning and at bedtime.    [provider]  levETIRAcetam (KEPPRA) 750 MG tablet Take 1 tablet (750 mg total) by mouth 2 (two) times daily. 07/20/19   Virgel Manifold, MD  lidocaine 4 % Place 1 patch onto the skin daily as needed (pain.). Salonpas Maximum Strength    [provider]  losartan-hydrochlorothiazide (HYZAAR) 100-25 MG tablet Take 1 tablet by mouth daily.    [provider]  pantoprazole (PROTONIX) 40 MG tablet Take 40 mg by mouth daily.    [provider]  polyethylene glycol (MIRALAX / GLYCOLAX) 17 g packet Take 17 g by mouth daily as needed (constipation.).    [provider]    Physical Exam: Vitals:   09/07/22 2300 09/07/22 2315 09/08/22 0000 09/08/22 0030  BP: 112/68 127/70 138/75 123/69  Pulse: (!) 109 (!) 108 (!) 103 (!) 102  Resp: 19 (!) 21 (!) 23 (!) 23  Temp:      TempSrc:      SpO2: 96% 95% 100% 96%    Physical Exam Vitals reviewed.  Constitutional:      General: She is not in acute distress. HENT:     Head: Normocephalic and atraumatic.  Eyes:     Extraocular Movements: Extraocular movements intact.  Cardiovascular:     Rate and Rhythm: Normal rate and regular rhythm.     Pulses: Normal pulses.  Pulmonary:     Effort: Pulmonary effort is normal. No respiratory distress.     Breath sounds: No wheezing.  Abdominal:     General: Bowel sounds are normal. There is no  distension.     Palpations: Abdomen is soft.     Tenderness: There is no abdominal tenderness.  Musculoskeletal:     Cervical back: Normal range of motion.     Right lower leg: No edema.     Left lower leg: No edema.  Skin:    General: Skin is warm and dry.  Neurological:     General: No focal deficit present.     Mental Status: She is alert and oriented to person, place, and time.     Labs on Admission: I have personally reviewed following labs and imaging studies  CBC: Recent Labs  Lab 09/07/22 2030  WBC 19.2*  NEUTROABS 15.4*  HGB 12.8  HCT 40.7  MCV 90.8  PLT 751*   Basic Metabolic Panel: Recent Labs  Lab 09/07/22 2030  NA 142  K 3.4*  CL 105  CO2 25  GLUCOSE 119*  BUN 15  CREATININE 0.95  CALCIUM 9.5   GFR: CrCl cannot be calculated (Unknown ideal weight.). Liver Function Tests: Recent Labs  Lab 09/07/22 2030  AST 14*  ALT 10  ALKPHOS 86  BILITOT 0.5  PROT 8.1  ALBUMIN 3.1*   No results for input(s): "LIPASE", "AMYLASE" in the last 168 hours. No results for input(s): "AMMONIA" in the last 168 hours. Coagulation Profile: No results for input(s): "INR", "PROTIME" in the last 168 hours. Cardiac Enzymes: No results for input(s): "CKTOTAL", "CKMB", "CKMBINDEX", "TROPONINI" in the last 168 hours. BNP (last 3 results) No results for input(s): "PROBNP" in the last 8760 hours. HbA1C: No results for input(s): "HGBA1C" in the last 72 hours. CBG: No results for input(s): "GLUCAP" in the last 168 hours. Lipid Profile: No results for input(s): "CHOL", "HDL", "LDLCALC", "TRIG", "CHOLHDL", "LDLDIRECT" in the last 72 hours. Thyroid Function Tests: No results for input(s): "TSH", "T4TOTAL", "FREET4", "T3FREE", "THYROIDAB" in the last 72 hours. Anemia Panel: No results for input(s): "VITAMINB12", "FOLATE", "FERRITIN", "TIBC", "IRON", "RETICCTPCT" in the last 72 hours. Urine analysis:    Component Value Date/Time   COLORURINE YELLOW 09/08/2022 0014    APPEARANCEUR CLEAR 09/08/2022 0014   APPEARANCEUR Clear 01/28/2014 1937   LABSPEC >1.046 (H) 09/08/2022 0014   LABSPEC 1.015 01/28/2014 1937   PHURINE 5.0 09/08/2022 0014   GLUCOSEU NEGATIVE 09/08/2022 0014   GLUCOSEU Negative 01/28/2014 1937   HGBUR SMALL (A) 09/08/2022 0014   BILIRUBINUR NEGATIVE 09/08/2022 0014   BILIRUBINUR Negative 01/28/2014 1937   KETONESUR 20 (A) 09/08/2022 0014   PROTEINUR >=300 (A) 09/08/2022 0014   UROBILINOGEN 0.2 03/01/2015 1935   NITRITE NEGATIVE 09/08/2022 0014   LEUKOCYTESUR NEGATIVE 09/08/2022 0014   LEUKOCYTESUR Negative 01/28/2014 1937    Radiological Exams on Admission: CT Chest W Contrast  Result Date: 09/07/2022 CLINICAL DATA:  Sepsis. EXAM: CT CHEST WITH CONTRAST TECHNIQUE: Multidetector CT imaging of the chest was performed during intravenous contrast administration. RADIATION DOSE REDUCTION: This exam was performed according to the departmental dose-optimization program which includes automated exposure control, adjustment of the mA and/or kV according to patient size and/or use of iterative reconstruction technique. CONTRAST:  17m OMNIPAQUE IOHEXOL 300 MG/ML  SOLN COMPARISON:  Chest radiograph earlier today. FINDINGS: Cardiovascular: The heart is normal in size. Moderate atherosclerosis of the thoracic aorta. No aneurysm. No evidence of acute aortic finding on this non CTA exam. No pericardial effusion. No central pulmonary embolus. Mediastinum/Nodes: No adenopathy. Patulous esophagus without wall thickening. Small right axillary and subpectoral lymph nodes, all subcentimeter short axis. Lungs/Pleura: Breathing motion artifact limits assessment, particularly at the lung bases. Mild apical emphysema. Prominent central bronchial thickening. Mucoid impaction of the left lower lobe. Minimal ground-glass opacity in the right upper lobe abuts the fissure. Dependent opacity in the right lower lobe which is somewhat nodular in appearance and may account for  radiographic appearance, series 5, images 90-92. Peribronchovascular opacity in the dependent left lower lobe. Trachea and central airways are patent. Upper Abdomen: Multiple bilateral renal cysts, no further follow-up is needed. No acute upper abdominal findings. Musculoskeletal: There are no acute or suspicious osseous abnormalities. Exaggerated thoracic kyphosis. No chest wall soft tissue abnormalities. IMPRESSION: 1. Dependent airspace opacity in the right lower lobe which is somewhat nodular in appearance and likely accounts for radiographic appearance. This may represent pneumonia or aspiration. 2. Additional peribronchovascular opacities in the dependent left lower lobe. Minimal patchy ground-glass opacity in the perifissural right upper lobe. Favor infectious etiology. 3. Prominent central bronchial thickening, can be  seen with bronchitis or reactive airways disease. Aortic Atherosclerosis (ICD10-I70.0) and Emphysema (ICD10-J43.9). Electronically Signed   By: Keith Rake M.D.   On: 09/07/2022 22:38   DG Chest 2 View  Result Date: 09/07/2022 CLINICAL DATA:  Shortness of breath, wheezing, fever. EXAM: CHEST - 2 VIEW COMPARISON:  09/09/2020 FINDINGS: Limited by positioning, the patient's chin obscures the left upper lung zone. Additionally the patient is moderately rotated and anti lordotic in positioning. The heart is normal in size. Possible 13 mm right basilar nodule. Peribronchial thickening. No significant pleural effusion. No pneumothorax. Exaggerated thoracic kyphosis. IMPRESSION: 1. Peribronchial thickening which may be bronchitis or asthma. 2. Possible 13 mm right basilar nodule. This is higher than expected for nipple shadow. Consider chest CT for characterization (can be performed on an elective basis based on clinical scenario). 3. Limited exam due to positioning and rotation. Electronically Signed   By: Keith Rake M.D.   On: 09/07/2022 18:54    EKG: No EKG done in the ED.  EKG has  been ordered now and currently pending.  Assessment and Plan  Severe sepsis secondary to multifocal pneumonia Met criteria for severe sepsis at the time of presentation with tachycardia, tachypnea, leukocytosis, and lactic acidosis.  CT showing dependent opacity in the right lower lobe concerning for pneumonia or aspiration.  Also showing additional peribronchovascular opacities in the dependent left lower lobe and minimal patchy groundglass opacity in the perifissural right upper lobe concerning for infectious etiology.  COVID and influenza PCR negative.  Patient is not hypoxic. -Continue ceftriaxone and azithromycin.  Will add on metronidazole given concern for possible aspiration. -Tachycardia improved after 2.5 L fluid boluses, continue IV fluid hydration -Strep pneumo and Legionella urinary antigens -MRSA PCR screen -Sputum Gram stain and culture -Blood cultures pending -Trend lactate and WBC count -Continuous pulse ox, supplemental oxygen as needed to keep oxygen saturation above 92%. -Keep n.p.o. at this time, aspiration precautions, SLP eval  Acute metabolic encephalopathy Reportedly confused with family but at present AAOx4, answering questions appropriately, and does not appear confused.  No focal neurodeficit on exam.  Suspect mild confusion at home is likely related to acute illness. -Continue to monitor closely  Mild hypokalemia -Replace potassium and continue to monitor -Check magnesium level and replace if low  Asthma Stable, not wheezing. -DuoNeb as needed  Hypertension: Blood pressure stable. Seizure disorder GERD -Pharmacy med rec pending.  DVT prophylaxis: Lovenox Code Status: Full Code (discussed with the patient) Family Communication: No family available at this time. Level of care: Telemetry bed Admission status: It is my clinical opinion that admission to INPATIENT is reasonable and necessary because of the expectation that this patient will require  hospital care that crosses at least 2 midnights to treat this condition based on the medical complexity of the problems presented.  Given the aforementioned information, the predictability of an adverse outcome is felt to be significant.   Shela Leff MD [facility]  If 7PM-7AM, please contact night-coverage [email or web address]  09/08/2022, 12:46 AM

## 2022-09-08 NOTE — Assessment & Plan Note (Addendum)
Clinically improving Continuing intravenous antibiotics for now Weaning supplemental oxygen as able Strep pneumoniae urinary antigen positive Blood cultures revealing no growth at this point Lactic acidosis resolved

## 2022-09-08 NOTE — Assessment & Plan Note (Addendum)
Resolving. Likely secondary to underlying infection

## 2022-09-08 NOTE — Assessment & Plan Note (Deleted)
CT imaging of the chest on presentation revealing infiltrates in the right lower lobe, left lower lobe and right upper lobe. Continuing treatment of ceftriaxone and azithromycin Strep pneumoniae urinary antigen positive Blood cultures revealing no growth at this point

## 2022-09-08 NOTE — Assessment & Plan Note (Signed)
Replacing with intravenous magnesium sulfate Monitoring magnesium levels with serial chemistries.   

## 2022-09-09 ENCOUNTER — Inpatient Hospital Stay (HOSPITAL_COMMUNITY): Payer: Medicare (Managed Care)

## 2022-09-09 DIAGNOSIS — R6520 Severe sepsis without septic shock: Secondary | ICD-10-CM

## 2022-09-09 DIAGNOSIS — I10 Essential (primary) hypertension: Secondary | ICD-10-CM

## 2022-09-09 DIAGNOSIS — A401 Sepsis due to streptococcus, group B: Principal | ICD-10-CM

## 2022-09-09 DIAGNOSIS — E872 Acidosis, unspecified: Secondary | ICD-10-CM

## 2022-09-09 DIAGNOSIS — E876 Hypokalemia: Secondary | ICD-10-CM

## 2022-09-09 DIAGNOSIS — G9341 Metabolic encephalopathy: Secondary | ICD-10-CM

## 2022-09-09 LAB — CBC WITH DIFFERENTIAL/PLATELET
Abs Immature Granulocytes: 0.27 10*3/uL — ABNORMAL HIGH (ref 0.00–0.07)
Basophils Absolute: 0.1 10*3/uL (ref 0.0–0.1)
Basophils Relative: 1 %
Eosinophils Absolute: 0.1 10*3/uL (ref 0.0–0.5)
Eosinophils Relative: 1 %
HCT: 33.3 % — ABNORMAL LOW (ref 36.0–46.0)
Hemoglobin: 10.6 g/dL — ABNORMAL LOW (ref 12.0–15.0)
Immature Granulocytes: 2 %
Lymphocytes Relative: 19 %
Lymphs Abs: 2.4 10*3/uL (ref 0.7–4.0)
MCH: 29.4 pg (ref 26.0–34.0)
MCHC: 31.8 g/dL (ref 30.0–36.0)
MCV: 92.2 fL (ref 80.0–100.0)
Monocytes Absolute: 0.9 10*3/uL (ref 0.1–1.0)
Monocytes Relative: 7 %
Neutro Abs: 8.8 10*3/uL — ABNORMAL HIGH (ref 1.7–7.7)
Neutrophils Relative %: 70 %
Platelets: 340 10*3/uL (ref 150–400)
RBC: 3.61 MIL/uL — ABNORMAL LOW (ref 3.87–5.11)
RDW: 14.4 % (ref 11.5–15.5)
WBC: 12.7 10*3/uL — ABNORMAL HIGH (ref 4.0–10.5)
nRBC: 0 % (ref 0.0–0.2)

## 2022-09-09 LAB — LEGIONELLA PNEUMOPHILA SEROGP 1 UR AG: L. pneumophila Serogp 1 Ur Ag: NEGATIVE

## 2022-09-09 LAB — GASTROINTESTINAL PANEL BY PCR, STOOL (REPLACES STOOL CULTURE)

## 2022-09-09 LAB — SURGICAL PCR SCREEN
MRSA, PCR: NEGATIVE
Staphylococcus aureus: NEGATIVE

## 2022-09-09 LAB — COMPREHENSIVE METABOLIC PANEL
ALT: 14 U/L (ref 0–44)
AST: 20 U/L (ref 15–41)
Albumin: 2.4 g/dL — ABNORMAL LOW (ref 3.5–5.0)
Alkaline Phosphatase: 70 U/L (ref 38–126)
Anion gap: 4 — ABNORMAL LOW (ref 5–15)
BUN: 14 mg/dL (ref 8–23)
CO2: 27 mmol/L (ref 22–32)
Calcium: 9 mg/dL (ref 8.9–10.3)
Chloride: 109 mmol/L (ref 98–111)
Creatinine, Ser: 0.69 mg/dL (ref 0.44–1.00)
GFR, Estimated: >60 mL/min (ref >60)
Glucose, Bld: 118 mg/dL — ABNORMAL HIGH (ref 70–99)
Potassium: 4.4 mmol/L (ref 3.5–5.1)
Sodium: 140 mmol/L (ref 135–145)
Total Bilirubin: 0.1 mg/dL — ABNORMAL LOW (ref 0.3–1.2)
Total Protein: 6.6 g/dL (ref 6.5–8.1)

## 2022-09-09 LAB — MAGNESIUM: Magnesium: 2 mg/dL (ref 1.7–2.4)

## 2022-09-09 LAB — LACTIC ACID, PLASMA: Lactic Acid, Venous: 1.9 mmol/L (ref 0.5–1.9)

## 2022-09-09 MED ORDER — IPRATROPIUM-ALBUTEROL 0.5-2.5 (3) MG/3ML IN SOLN
3.0000 mL | RESPIRATORY_TRACT | Status: AC
  Administered 2022-09-09 (×3): 3 mL via RESPIRATORY_TRACT
  Filled 2022-09-09 (×3): qty 3

## 2022-09-09 MED ORDER — BENZONATATE 100 MG PO CAPS
100.0000 mg | ORAL_CAPSULE | Freq: Three times a day (TID) | ORAL | Status: DC | PRN
  Administered 2022-09-09 – 2022-09-10 (×2): 100 mg via ORAL
  Filled 2022-09-09 (×3): qty 1

## 2022-09-09 MED ORDER — FUROSEMIDE 10 MG/ML IJ SOLN
20.0000 mg | Freq: Once | INTRAMUSCULAR | Status: AC
  Administered 2022-09-09: 20 mg via INTRAVENOUS
  Filled 2022-09-09: qty 2

## 2022-09-09 NOTE — Evaluation (Signed)
Physical Therapy Evaluation Patient Details Name: Lindsey [name] MRN: [id number] DOB: 1956/02/06 Today's Date: 09/09/2022  History of Present Illness  66 year old female with past medical history of asthma, hypertension, seizure disorder, gastroesophageal reflux disease presenting to [facility] emergency department with complaints of shortness of breath, cough ,and fever, multiple SIRS criteria including tachycardia, tachypnea and substantial leukocytosis, concurrent lactic acidosis.  CT imaging of the chest revealed a right lower lobe infiltrate concerning for pneumonia.  Clinical Impression    Pt admitted with above diagnosis. . Pt currently with functional limitations due to the deficits listed below (see PT Problem List). Pt will benefit from skilled PT to increase their independence and safety with mobility to allow discharge to the venue listed below.   The patient  received , lethargic, aroused and able to participate in mobility and transfer to recliner with min assistance. Patient became very alert and stated she was happy to be sitting up"I don't stay in bed". No family present,per chart per daughter, patient was independent PTA. Patient states that she uses a WC. [facility]     Recommendations for follow up therapy are one component of a multi-disciplinary discharge planning process, led by the attending physician.  Recommendations may be updated based on patient status, additional functional criteria and insurance authorization.  Follow Up Recommendations Home health PT until able to return to [facility] and patient has 24/7 caregivers.      Assistance Recommended at Discharge Frequent or constant Supervision/Assistance  Patient can return home with the following  A little help with walking and/or transfers;A little help with bathing/dressing/bathroom;Help with stairs or ramp for entrance;Assistance with cooking/housework;Assist for transportation     Equipment Recommendations None recommended by PT  Recommendations for Other Services       Functional Status Assessment Patient has had a recent decline in their functional status and demonstrates the ability to make significant improvements in function in a reasonable and predictable amount of time.     Precautions / Restrictions Precautions Precautions: Fall Precaution Comments: monitp sats and HR      Mobility  Bed Mobility Overal bed mobility: Needs Assistance Bed Mobility: Supine to Sit     Supine to sit: Min assist, HOB elevated     General bed mobility comments: extra time, HOB raised    Transfers Overall transfer level: Needs assistance Equipment used: Rolling walker (2 wheels) Transfers: Sit to/from Stand, Bed to chair/wheelchair/BSC Sit to Stand: Min assist   Step pivot transfers: +2 safety/equipment       General transfer comment: Patient stood  up to recliner, able to step to recliner    Ambulation/Gait                  Stairs            Wheelchair Mobility    Modified Rankin (Stroke Patients Only)       Balance Overall balance assessment: Needs assistance Sitting-balance support: Feet supported, No upper extremity supported Sitting balance-Leahy Scale: Fair     Standing balance support: During functional activity, Reliant on assistive device for balance, Bilateral upper extremity supported Standing balance-Leahy Scale: Poor                               Pertinent Vitals/Pain Pain Assessment Pain Assessment: No/denies pain    Home Living Family/patient expects to be discharged to:: Private residence Living Arrangements: Children Available Help  at Discharge: Available 24 hours/day Type of Home: House Home Access: Level entry           Additional Comments: patient  states she lives with daughter,  [facility] x's /[facility]    Prior Function Prior Level of Function : Independent/Modified Independent              Mobility Comments: per Epic  MD note, daughter reports patient independnet PTA, Patient states that she does not walk, she uses a WC and wants an electric WC. ADLs Comments: unsure     Hand Dominance   Dominant Hand: Right    Extremity/Trunk Assessment   Upper Extremity Assessment Upper Extremity Assessment: Generalized weakness    Lower Extremity Assessment Lower Extremity Assessment: Generalized weakness    Cervical / Trunk Assessment Cervical / Trunk Assessment: Kyphotic  Communication   Communication: No difficulties  Cognition Arousal/Alertness: Lethargic Behavior During Therapy: WFL for tasks assessed/performed Overall Cognitive Status: No family/caregiver present to determine baseline cognitive functioning Area of Impairment: Orientation                 Orientation Level: Time, Situation             General Comments: Patient was very lethargic initially but really perked up and able to mobilize with min assistance  to recliner. Patient oriented to December, able to give info that she goes to [place] 5 days a week.        General Comments      Exercises     Assessment/Plan    PT Assessment Patient needs continued PT services  PT Problem List Decreased strength;Decreased mobility;Decreased knowledge of precautions;Decreased activity tolerance;Decreased balance;Decreased knowledge of use of DME       PT Treatment Interventions DME instruction;Therapeutic activities;Gait training;Therapeutic exercise;Patient/family education;Functional mobility training    PT Goals (Current goals can be found in the Care Plan section)  Acute Rehab PT Goals PT Goal Formulation: Patient unable to participate in goal setting Time For Goal Achievement: 09/23/22 Potential to Achieve Goals: Good    Frequency Min 3X/week     Co-evaluation               AM-PAC PT "6 Clicks" Mobility  Outcome Measure Help needed turning from your back to your side  while in a flat bed without using bedrails?: A Little Help needed moving from lying on your back to sitting on the side of a flat bed without using bedrails?: A Little Help needed moving to and from a bed to a chair (including a wheelchair)?: A Little Help needed standing up from a chair using your arms (e.g., wheelchair or bedside chair)?: A Little Help needed to walk in hospital room?: Total Help needed climbing 3-5 steps with a railing? : Total 6 Click Score: 14    End of Session Equipment Utilized During Treatment: Gait belt Activity Tolerance: Patient tolerated treatment well Patient left: in chair;with call bell/phone within reach;with chair alarm set Nurse Communication: Mobility status PT Visit Diagnosis: Unsteadiness on feet (R26.81);Difficulty in walking, not elsewhere classified (R26.2)    Time: 6160-7371 PT Time Calculation (min) (ACUTE ONLY): 32 min   Charges:   PT Evaluation $PT Eval Low Complexity: 1 Low PT Treatments $Therapeutic Activity: 8-22 mins        Tresa Endo PT Acute Rehabilitation Services Office [phone] Weekend [phone]  Lindsey [name] 09/09/2022, 10:09 AM

## 2022-09-09 NOTE — Evaluation (Signed)
SLP Cancellation Note  Patient Details Name: Lindsey [name] MRN: [id number] DOB: [birth year]   Cancelled treatment:       Reason Eval/Treat Not Completed: Other (comment) (Received order for swallow evaluation, will conduct next date. Thanks.)  Kathleen Lime, MS [facility] SLP Acute Rehab Services Office [phone] Pager [phone]  Macario Golds 09/09/2022, 3:43 PM

## 2022-09-09 NOTE — Progress Notes (Addendum)
Patient complained of nausea and states not feeling good on her stomach. HR on 110s sustaining. PRN zofran IV and protonix PO given. O2 sat on mid 80s RA with c/o mild SOB, placed on 2L nasal cannula with relief. RT notified of prn neb treatment. Abigail Butts, NP made aware.  Patient verbalized that her abdominal pain is 10/10 when she coughs, more on the lower belly.  Abigail Butts, NP updated.

## 2022-09-09 NOTE — Plan of Care (Signed)
Patient alert and oriented x 3-4, forgetful. Frequent, strong, non productive cough noted. C/o mild SOB, o2 sat on mid 80s at RA, placed on 2L [place], prn neb treatment given. C/o nausea and pain on lower belly when coughing, medicated per MAR. Sinus tach on tele. Zebedee Iba, NP notified. Incontinent, purewick in placed. IVF given. VS monitored. Safety and fall precautions observed. Call bell within reach.   Problem: Education: Goal: Knowledge of General Education information will improve Description: Including pain rating scale, medication(s)/side effects and non-pharmacologic comfort measures Outcome: Progressing   Problem: Health Behavior/Discharge Planning: Goal: Ability to manage health-related needs will improve Outcome: Progressing   Problem: Clinical Measurements: Goal: Ability to maintain clinical measurements within normal limits will improve Outcome: Progressing Goal: Will remain free from infection Outcome: Progressing Goal: Respiratory complications will improve Outcome: Progressing   Problem: Pain Managment: Goal: General experience of comfort will improve Outcome: Progressing   Problem: Safety: Goal: Ability to remain free from injury will improve Outcome: Progressing   Problem: Skin Integrity: Goal: Risk for impaired skin integrity will decrease Outcome: Progressing

## 2022-09-09 NOTE — TOC Progression Note (Signed)
Transition of Care [facility]) - Progression Note    Patient Details  Name: Lindsey [name] MRN: [id number] Date of Birth: 11-02-1955  Transition of Care [facility]) CM/SW [place], LCSW Phone Number: 09/09/2022, 12:02 PM  Clinical Narrative:    CSW spoke with Bearl Mulberry, rehab director at [facility], who shares that this pt is active with their PT/OT and these services will continue for pt in home at discharge.    Expected Discharge Plan: Home/Self Care Barriers to Discharge: Continued Medical Work up  Expected Discharge Plan and Services Expected Discharge Plan: Home/Self Care In-house Referral: NA Discharge Planning Services: NA Post Acute Care Choice: Resumption of Svcs/PTA Provider Living arrangements for the past 2 months: Single Family Home                 DME Arranged: N/A DME Agency: NA                   Social Determinants of Health (SDOH) Interventions    Readmission Risk Interventions    09/09/2022   12:02 PM 09/08/2022    1:22 PM 09/12/2020    1:31 PM  Readmission Risk Prevention Plan  Transportation Screening Complete Complete Complete  PCP or Specialist Appt within 5-7 Days Complete Complete Complete  Home Care Screening Complete Complete Complete  Medication Review (RN CM) Complete Complete Referral to Pharmacy

## 2022-09-09 NOTE — Progress Notes (Signed)
PROGRESS NOTE   Lindsey [name]  YQI:[id number] DOB: [birth year] DOA: 09/07/2022 PCP: [name], MD   Date of Service: the patient was seen and examined on 09/09/2022  Brief Narrative:  66 year old female with past medical history of asthma, hypertension, seizure disorder, gastroesophageal reflux disease presenting to [facility] emergency department with complaints of shortness of breath cough and fever.  Upon evaluation in the emergency department patient was found to exhibit multiple SIRS criteria including tachycardia, tachypnea and substantial leukocytosis.  Patient was additionally found to have a concurrent lactic acidosis.  CT imaging of the chest revealed a right lower lobe infiltrate concerning for pneumonia.  Patient was initiated on intravenous antibiotics with ceftriaxone and azithromycin and the hospitalist group was then called to assess the patient for admission to the hospital.     Assessment and Plan: * Sepsis due to group B Streptococcus with encephalopathy without septic shock ([place]) Clinically improving Continuing intravenous antibiotics for now Weaning supplemental oxygen as able Strep pneumoniae urinary antigen positive Blood cultures revealing no growth at this point Lactic acidosis resolved  Multifocal pneumonia Overall clinically improving bronchodilator therapy for bouts of shortness of breath and wheezing Please see remainder of assessment and plan above  Lactic acidosis Resolved  Acute metabolic encephalopathy Resolving. Likely secondary to underlying infection  Hypomagnesemia Replaced   Hypokalemia Replaced      Subjective:  Patient states that she feels better however she is still complaining of generalized abdominal pain, sore in quality, mild to moderate intensity.  Associated loose stools seem to have resolved.  Patient is also complaining of continued wheezing and mild shortness of breath.  Physical Exam:  Vitals:    09/09/22 0143 09/09/22 0414 09/09/22 1301 09/09/22 2119  BP:  (!) 124/59 (!) 140/71 122/67  Pulse:  93 87 93  Resp:  18 (!) 22 20  Temp:  98.2 F (36.8 C) (!) 97.5 F (36.4 C)   TempSrc:  Oral Oral   SpO2: 99% 99% 100% 96%    Constitutional: Awake alert and oriented x3, no associated distress.   Skin: no rashes, no lesions, good skin turgor noted. Eyes: Pupils are equally reactive to light.  No evidence of scleral icterus or conjunctival pallor.  ENMT: Moist mucous membranes noted.  Posterior pharynx clear of any exudate or lesions.   Respiratory: Bibasilar rales with notable expiratory wheezing in all fields.  Normal respiratory effort. No accessory muscle use.  Cardiovascular: Regular rate and rhythm, no murmurs / rubs / gallops. No extremity edema. 2+ pedal pulses. No carotid bruits.  Abdomen: Generalized Donnell tenderness.  Abdomen is soft.  No evidence of intra-abdominal masses.  Positive bowel sounds noted in all quadrants.   Musculoskeletal: No joint deformity upper and lower extremities. Good ROM, no contractures. Normal muscle tone.    Data Reviewed:  I have personally reviewed and interpreted labs, imaging.  Significant findings are   CBC: Recent Labs  Lab 09/07/22 2030 09/08/22 0615 09/09/22 1032  WBC 19.2* 16.7* 12.7*  NEUTROABS 15.4*  --  8.8*  HGB 12.8 10.7* 10.6*  HCT 40.7 33.9* 33.3*  MCV 90.8 91.1 92.2  PLT 405* 330 387   Basic Metabolic Panel: Recent Labs  Lab 09/07/22 2030 09/08/22 0615 09/08/22 0616 09/09/22 1032  NA 142  --  143 140  K 3.4*  --  3.9 4.4  CL 105  --  107 109  CO2 25  --  28 27  GLUCOSE 119*  --  103* 118*  BUN 15  --  13 14  CREATININE 0.95  --  0.67 0.69  CALCIUM 9.5  --  8.7* 9.0  MG  --  1.6*  --  2.0   GFR: CrCl cannot be calculated (Unknown ideal weight.). Liver Function Tests: Recent Labs  Lab 09/07/22 2030 09/09/22 1032  AST 14* 20  ALT 10 14  ALKPHOS 86 70  BILITOT 0.5 0.1*  PROT 8.1 6.6  ALBUMIN  3.1* 2.4*      Code Status:  Full code.  Code status decision has been confirmed with: patient    Severity of Illness:  The appropriate patient status for this patient is INPATIENT. Inpatient status is judged to be reasonable and necessary in order to provide the required intensity of service to ensure the patient's safety. The patient's presenting symptoms, physical exam findings, and initial radiographic and laboratory data in the context of their chronic comorbidities is felt to place them at high risk for further clinical deterioration. Furthermore, it is not anticipated that the patient will be medically stable for discharge from the hospital within 2 midnights of admission.   * I certify that at the point of admission it is my clinical judgment that the patient will require inpatient hospital care spanning beyond 2 midnights from the point of admission due to high intensity of service, high risk for further deterioration and high frequency of surveillance required.*  Time spent:  35 minutes  Author:  Vernelle Emerald MD  09/09/2022 9:26 PM

## 2022-09-10 ENCOUNTER — Other Ambulatory Visit (HOSPITAL_COMMUNITY): Payer: Self-pay

## 2022-09-10 DIAGNOSIS — G40909 Epilepsy, unspecified, not intractable, without status epilepticus: Secondary | ICD-10-CM

## 2022-09-10 LAB — COMPREHENSIVE METABOLIC PANEL
ALT: 13 U/L (ref 0–44)
AST: 16 U/L (ref 15–41)
Albumin: 2.4 g/dL — ABNORMAL LOW (ref 3.5–5.0)
Alkaline Phosphatase: 67 U/L (ref 38–126)
Anion gap: 8 (ref 5–15)
BUN: 11 mg/dL (ref 8–23)
CO2: 30 mmol/L (ref 22–32)
Calcium: 9.4 mg/dL (ref 8.9–10.3)
Chloride: 102 mmol/L (ref 98–111)
Creatinine, Ser: 0.52 mg/dL (ref 0.44–1.00)
GFR, Estimated: >60 mL/min (ref >60)
Glucose, Bld: 110 mg/dL — ABNORMAL HIGH (ref 70–99)
Potassium: 3.9 mmol/L (ref 3.5–5.1)
Sodium: 140 mmol/L (ref 135–145)
Total Bilirubin: 0.1 mg/dL — ABNORMAL LOW (ref 0.3–1.2)
Total Protein: 7.1 g/dL (ref 6.5–8.1)

## 2022-09-10 LAB — CBC WITH DIFFERENTIAL/PLATELET
Abs Immature Granulocytes: 0.66 10*3/uL — ABNORMAL HIGH (ref 0.00–0.07)
Basophils Absolute: 0 10*3/uL (ref 0.0–0.1)
Basophils Relative: 0 %
Eosinophils Absolute: 0.1 10*3/uL (ref 0.0–0.5)
Eosinophils Relative: 1 %
HCT: 38 % (ref 36.0–46.0)
Hemoglobin: 11.8 g/dL — ABNORMAL LOW (ref 12.0–15.0)
Immature Granulocytes: 5 %
Lymphocytes Relative: 17 %
Lymphs Abs: 2.1 10*3/uL (ref 0.7–4.0)
MCH: 29.1 pg (ref 26.0–34.0)
MCHC: 31.1 g/dL (ref 30.0–36.0)
MCV: 93.6 fL (ref 80.0–100.0)
Monocytes Absolute: 1.3 10*3/uL — ABNORMAL HIGH (ref 0.1–1.0)
Monocytes Relative: 11 %
Neutro Abs: 8.4 10*3/uL — ABNORMAL HIGH (ref 1.7–7.7)
Neutrophils Relative %: 66 %
Platelets: 370 10*3/uL (ref 150–400)
RBC: 4.06 MIL/uL (ref 3.87–5.11)
RDW: 14.6 % (ref 11.5–15.5)
WBC: 12.6 10*3/uL — ABNORMAL HIGH (ref 4.0–10.5)
nRBC: 0 % (ref 0.0–0.2)

## 2022-09-10 LAB — BRAIN NATRIURETIC PEPTIDE: B Natriuretic Peptide: 81.3 pg/mL (ref 0.0–100.0)

## 2022-09-10 LAB — MAGNESIUM: Magnesium: 1.5 mg/dL — ABNORMAL LOW (ref 1.7–2.4)

## 2022-09-10 LAB — LIPASE, BLOOD: Lipase: 29 U/L (ref 11–51)

## 2022-09-10 MED ORDER — CEFDINIR 300 MG PO CAPS
300.0000 mg | ORAL_CAPSULE | Freq: Once | ORAL | Status: AC
  Administered 2022-09-10: 300 mg via ORAL
  Filled 2022-09-10: qty 1

## 2022-09-10 MED ORDER — MAGNESIUM SULFATE 50 % IJ SOLN
3.0000 g | Freq: Once | INTRAVENOUS | Status: AC
  Administered 2022-09-10: 3 g via INTRAVENOUS
  Filled 2022-09-10: qty 6

## 2022-09-10 MED ORDER — CEFDINIR 300 MG PO CAPS: 300.0000 mg | ORAL_CAPSULE | Freq: Two times a day (BID) | ORAL | 0 refills | Status: DC

## 2022-09-10 MED ORDER — CEFPODOXIME PROXETIL 200 MG PO TABS: 200.0000 mg | ORAL_TABLET | Freq: Two times a day (BID) | ORAL | 0 refills | Status: DC

## 2022-09-10 NOTE — Evaluation (Signed)
Clinical/Bedside Swallow Evaluation Patient Details  Name: Lindsey [name] MRN: [id number] Date of Birth: [birth year]  Today's Date: 09/10/2022 Time: SLP Start Time (ACUTE ONLY): 71 SLP Stop Time (ACUTE ONLY): 1025 SLP Time Calculation (min) (ACUTE ONLY): 20 min  Past Medical History:  Past Medical History:  Diagnosis Date   Arthritis    Asthma    Drug abuse ([place])    recovered   GERD (gastroesophageal reflux disease)    H/O ETOH abuse    HTN (hypertension)    Seizure ([place])    Past Surgical History:  Past Surgical History:  Procedure Laterality Date   ABDOMINAL HYSTERECTOMY     COLONOSCOPY WITH PROPOFOL N/A 03/18/2022   Procedure: COLONOSCOPY WITH PROPOFOL;  Surgeon: Milus Banister, MD;  Location: [facility];  Service: Gastroenterology;  Laterality: N/A;   GALLBLADDER SURGERY     peptic ulcer surgery     POLYPECTOMY  03/18/2022   Procedure: POLYPECTOMY;  Surgeon: Milus Banister, MD;  Location: [facility];  Service: Gastroenterology;;   HPI:  66 y.o. female with medical history significant of asthma, hypertension, seizure disorder, drug and alcohol abuse in remission, GERD presented to ED with shortness of breath, cough, fever, and confusion.  On arrival to the ED, patient was tachycardic to the 130s, tachypneic to the 20s.  Afebrile and not hypotensive or hypoxic.  Labs showing WBC 19, ? Pna right lobe on cxr - CT chest Dependent airspace opacity in the right lower lobe which is  somewhat nodular in appearance and likely accounts for radiographic  appearance. This may represent pneumonia or aspiration.  Swallow eval ordered.  Pt admits to some dysphagia with solids more than liquids and also stating her "throat" closes up at times.  She is currently taking a PPI>    Assessment / Plan / Recommendation  Clinical Impression  Patient presents with functional oropharyngeal swallow without indication aspiration or penetration of any consistency tested.  No focal CN deficits - pt  did advise that she has issues with foods more than drinks.  Also feels throat gets "tight" and had erucation post-swallow.  She did not pass 3 ounce Yale swallow screen with 2 attempts due to needing rest break.  Prolonged mastication with solids noted due to pt's lack of dentition - she uses liquids to aid oral transiting/mastication.   Suspect pt with dysphagia related to her known GERD - she is on a PPI.  Recommend pt continue diet with general precautions - and advised she monitor her swallowing closely and follow up with PCP if she has recurrent pna or worsening or non abating dysphagia symptoms.  No SLP follow up indicated. SLP Visit Diagnosis: Dysphagia, unspecified (R13.10)    Aspiration Risk  Mild aspiration risk    Diet Recommendation Regular;Dysphagia 3 (Mech soft);Thin liquid   Liquid Administration via: Straw;Cup Medication Administration: Whole meds with liquid Supervision: Patient able to self feed Compensations: Slow rate;Small sips/bites Postural Changes: Seated upright at 90 degrees;Remain upright for at least 30 minutes after po intake    Other  Recommendations Oral Care Recommendations: Oral care BID    Recommendations for follow up therapy are one component of a multi-disciplinary discharge planning process, led by the attending physician.  Recommendations may be updated based on patient status, additional functional criteria and insurance authorization.  Follow up Recommendations No SLP follow up      Assistance Recommended at Discharge    Functional Status Assessment Patient has not had a recent decline in their functional status  Frequency and Duration            Prognosis        Swallow Study   General Date of Onset: 09/10/22 HPI: 66 y.o. female with medical history significant of asthma, hypertension, seizure disorder, drug and alcohol abuse in remission, GERD presented to ED with shortness of breath, cough, fever, and confusion.  On arrival to the ED,  patient was tachycardic to the 130s, tachypneic to the 20s.  Afebrile and not hypotensive or hypoxic.  Labs showing WBC 19, ? Pna right lobe on cxr - CT chest Dependent airspace opacity in the right lower lobe which is  somewhat nodular in appearance and likely accounts for radiographic  appearance. This may represent pneumonia or aspiration.  Swallow eval ordered.  Pt admits to some dysphagia with solids more than liquids and also stating her "throat" closes up at times.  She is currently taking a PPI> Type of Study: Bedside Swallow Evaluation Previous Swallow Assessment: none Diet Prior to this Study: Thin liquids;Dysphagia 3 (soft) Temperature Spikes Noted: No Respiratory Status: Nasal cannula History of Recent Intubation: No Behavior/Cognition: Alert;Cooperative;Pleasant mood Oral Cavity Assessment: Within Functional Limits Oral Care Completed by SLP: No Oral Cavity - Dentition: Missing dentition Vision: Functional for self-feeding Self-Feeding Abilities: Able to feed self Patient Positioning: Upright in bed Baseline Vocal Quality: Other (comment) (strained) Volitional Cough: Strong Volitional Swallow: Able to elicit    Oral/Motor/Sensory Function Overall Oral Motor/Sensory Function: Within functional limits   Ice Chips Ice chips: Not tested   Thin Liquid Thin Liquid: Within functional limits Presentation: Straw Other Comments: 'tightness" in the throat    Nectar Thick Nectar Thick Liquid: Within functional limits Presentation: Straw;Self Fed   Honey Thick Honey Thick Liquid: Not tested   Puree Puree: Within functional limits Presentation: Self Fed;Spoon   Solid     Solid: Impaired Presentation: Self Fed Oral Phase Impairments: Impaired mastication Oral Phase Functional Implications: Prolonged oral transit;Impaired mastication Other Comments: graham cracker with peanut butter      Macario Golds 09/10/2022,10:45 AM Kathleen Lime, MS [facility] SLP [place] Office  [phone] Pager [phone]

## 2022-09-10 NOTE — Discharge Instructions (Signed)
Please follow up with your primary care provider in 1-2 weeks Your outpatient provider will provide you with the remainder of your outpatient antibiotic course for your pneumonia.  Please take it as directred Please return to the emergency department if you develop worsening weakness, shortness of breath or fevers in excess of 100.87F

## 2022-09-10 NOTE — Evaluation (Signed)
Occupational Therapy Evaluation Patient Details Name: Lindsey [name] MRN: [id number] DOB: April 04, 1956 Today's Date: 09/10/2022   History of Present Illness 66 year old female with past medical history of asthma, hypertension, seizure disorder, gastroesophageal reflux disease presenting to [facility] emergency department with complaints of shortness of breath, cough ,and fever, multiple SIRS criteria including tachycardia, tachypnea and substantial leukocytosis, concurrent lactic acidosis.  CT imaging of the chest revealed a right lower lobe infiltrate concerning for pneumonia.   Clinical Impression   Chart reviewed, pt greeted in chair agreeable to OT evaluation. Pt is oriented to self, place, situation; not oriented to date. Pt presents with STM deficits that she endorses in baseline. PTA pt reports she [facility], performs ADL generally with MOD I however daughter will assist as needed. Pt amb with SPC household distances per pt report. Pt presents with deficits in strength, endurance, activity tolerance, balance, cognation all affecting safe and optimal ADL completion. Recommend discharge home with HHOT to address functional deficits. OT will continue to follow acutely.      Recommendations for follow up therapy are one component of a multi-disciplinary discharge planning process, led by the attending physician.  Recommendations may be updated based on patient status, additional functional criteria and insurance authorization.   Follow Up Recommendations  Home health OT     Assistance Recommended at Discharge Frequent or constant Supervision/Assistance  Patient can return home with the following A little help with walking and/or transfers;A little help with bathing/dressing/bathroom;Direct supervision/assist for financial management;Direct supervision/assist for medications management    Functional Status Assessment  Patient has had a recent decline in  their functional status and demonstrates the ability to make significant improvements in function in a reasonable and predictable amount of time.  Equipment Recommendations  None recommended by OT    Recommendations for Other Services       Precautions / Restrictions Precautions Precautions: Fall Restrictions Weight Bearing Restrictions: No      Mobility Bed Mobility               General bed mobility comments: NT in recliner pre/post session    Transfers Overall transfer level: Needs assistance Equipment used: Rolling walker (2 wheels) Transfers: Sit to/from Stand, Bed to chair/wheelchair/BSC Sit to Stand: Supervision, Min guard (2 attempts with RW)                  Balance Overall balance assessment: Needs assistance Sitting-balance support: Feet supported, No upper extremity supported Sitting balance-Leahy Scale: Good     Standing balance support: During functional activity, Reliant on assistive device for balance, Bilateral upper extremity supported Standing balance-Leahy Scale: Fair                             ADL either performed or assessed with clinical judgement   ADL Overall ADL's : Needs assistance/impaired Eating/Feeding: Set up;Sitting   Grooming: Wash/dry hands;Wash/dry face;Sitting;Set up Grooming Details (indicate cue type and reason): anticipated         Upper Body Dressing : Set up;Sitting   Lower Body Dressing: Set up;Sitting/lateral leans Lower Body Dressing Details (indicate cue type and reason): socks in chair Toilet Transfer: Min guard;Rolling walker (2 wheels);Ambulation Toilet Transfer Details (indicate cue type and reason): simulated         Functional mobility during ADLs: Supervision/safety;Min guard;Rolling walker (2 wheels) (apporx 8' in room with RW)       Vision Patient  Visual Report: No change from baseline       Perception     Praxis      Pertinent Vitals/Pain Pain Assessment Pain  Assessment: No/denies pain     Hand Dominance     Extremity/Trunk Assessment Upper Extremity Assessment Upper Extremity Assessment: Generalized weakness   Lower Extremity Assessment Lower Extremity Assessment: Generalized weakness       Communication Communication Communication: No difficulties   Cognition Arousal/Alertness: Awake/alert Behavior During Therapy: WFL for tasks assessed/performed Overall Cognitive Status: No family/caregiver present to determine baseline cognitive functioning Area of Impairment: Following commands, Awareness, Memory                     Memory: Decreased short-term memory Following Commands: Follows one step commands with increased time   Awareness: Emergent         General Comments  HR up to 110 with activity, spo2 >90% on RA throughout (oxygen was off when OT entered room)    Exercises Other Exercises Other Exercises: edu re: role of OT, role of rehab, discharge recommendations, home safety, falls prevention, DME use   Shoulder Instructions      Home Living Family/patient expects to be discharged to:: Private residence (pt reports she lives in a motel) Living Arrangements: Children Available Help at Discharge: Available 24 hours/day   Home Access: Level entry                     Home Equipment: [employer] [place] - single Barista (2 wheels);Wheelchair - manual   Additional Comments: reports [facility]      Prior Functioning/Environment               Mobility Comments: pt reports she amb with [facility]PC with [facility] therapy ADLs Comments: pt reports generally MOD I with ADL, assist for bathing (sink level); assist for IADL        OT Problem List: Decreased strength;Decreased activity tolerance;Decreased cognition;Decreased knowledge of use of DME or AE      OT Treatment/Interventions: Self-care/ADL training;Therapeutic activities;Therapeutic exercise;Energy conservation;Patient/family  education    OT Goals(Current goals can be found in the care plan section) Acute Rehab OT Goals Patient Stated Goal: get stronger OT Goal Formulation: With patient Time For Goal Achievement: 09/24/22 Potential to Achieve Goals: Good ADL Goals Pt Will Perform Grooming: with supervision;sitting;standing Pt Will Perform Lower Body Dressing: with set-up Pt Will Transfer to Toilet: with modified independence Pt Will Perform Toileting - Clothing Manipulation and hygiene: with modified independence  OT Frequency: Min 2X/week    Co-evaluation              AM-PAC OT "6 Clicks" Daily Activity     Outcome Measure Help from another person eating meals?: None Help from another person taking care of personal grooming?: None Help from another person toileting, which includes using toliet, bedpan, or urinal?: A Lot Help from another person bathing (including washing, rinsing, drying)?: A Lot Help from another person to put on and taking off regular upper body clothing?: A Little Help from another person to put on and taking off regular lower body clothing?: A Little 6 Click Score: 18   End of Session Equipment Utilized During Treatment: Rolling walker (2 wheels) Nurse Communication: Mobility status (vital signs)  Activity Tolerance: Patient tolerated treatment well Patient left: in chair;with call bell/phone within reach;with chair alarm set  OT Visit Diagnosis: Muscle weakness (generalized) (M62.81);Other abnormalities of gait and mobility (  R26.89)                Time: 6979-4801 OT Time Calculation (min): 16 min Charges:  OT General Charges $OT Visit: 1 Visit OT Evaluation $OT Eval Low Complexity: [place], OTD OTR/L  09/10/22, 12:09 PM

## 2022-09-10 NOTE — Plan of Care (Signed)
Patient alert and oriented x 4. Strong frequent dry cough noted. On RA, no reports of SOB. Sinus rhythm, on tele. VS monitored. Safety and fall precautions observed.   Problem: Education: Goal: Knowledge of General Education information will improve Description: Including pain rating scale, medication(s)/side effects and non-pharmacologic comfort measures Outcome: Progressing   Problem: Clinical Measurements: Goal: Ability to maintain clinical measurements within normal limits will improve Outcome: Progressing   Problem: Pain Managment: Goal: General experience of comfort will improve Outcome: Progressing   Problem: Safety: Goal: Ability to remain free from injury will improve Outcome: Progressing   Problem: Skin Integrity: Goal: Risk for impaired skin integrity will decrease Outcome: Progressing

## 2022-09-10 NOTE — TOC Transition Note (Addendum)
Transition of Care [facility]) - CM/SW Discharge Note   Patient Details  Name: Lindsey [name] MRN: [id number] Date of Birth: 1956/04/12  Transition of Care [facility] CM/SW Contact:  Vassie Moselle, LCSW Phone Number: 09/10/2022, 2:27 PM   Clinical Narrative:    Pt is to return home with daughter and will be receiving in home PT/OT through [facility]. CSW spoke with pt's daughter and confirmed discharge plans. [facility] unable to transport pt after 4:30. Pt unsafe to transport via bus or taxi. [facility] called for transportation at 5:00pm.   Final next level of care: [place] Barriers to Discharge: Barriers Resolved   Patient Goals and CMS Choice Patient states their goals for this hospitalization and ongoing recovery are:: For pt to return home CMS Medicare.gov Compare Post Acute Care list provided to:: Patient Represenative (must comment) (Daughter) Choice offered to / list presented to : [facility] POA / Guardian, Adult Children  Discharge Placement                Patient to be transferred to facility by: [facility] transportation Name of family member notified: [name] Patient and family notified of of transfer: 09/10/22  Discharge Plan and Services In-house Referral: NA Discharge Planning Services: NA Post Acute Care Choice: Resumption of Svcs/PTA Provider          DME Arranged: N/A DME Agency: NA       HH Arranged: PT, OT [place] Agency: Other - See comment ([facility]) Date HH Agency Contacted: 09/09/22 Time [place]: 11 Representative spoke with at [place]: [employer] ([place]) Interventions     Readmission Risk Interventions    09/09/2022   12:02 PM 09/08/2022    1:22 PM 09/12/2020    1:31 PM  Readmission Risk Prevention Plan  Transportation Screening Complete Complete Complete  PCP or Specialist Appt within 5-7 Days Complete Complete Complete  Home Care Screening Complete Complete Complete  Medication Review  (RN CM) Complete Complete Referral to Pharmacy

## 2022-09-10 NOTE — Discharge Summary (Signed)
Physician Discharge Summary   Patient: Lindsey [name] MRN: [id number] DOB: Feb 04, 1956  Admit date:     09/07/2022  Discharge date: 09/10/22  Discharge Physician: Vernelle Emerald   PCP: [name], MD   Recommendations at discharge:   Patient was instructed to take all prescribed medications exactly as instructed including the remaining doses of her home-going regimen as provided to her by her [facility] primary care provider Dr. Lilia Pro Patient was advised to consume a low-sodium diet Patient is advised to increase activity as tolerated Patient was advised to return to the emergency department if she develops recurrent confusion, weakness, shortness of breath, or fevers greater than 100.5 F.  Discharge Diagnoses: Principal Problem:   Sepsis due to group B Streptococcus with encephalopathy without septic shock ([place]) Active Problems:   Multifocal pneumonia   Lactic acidosis   Acute metabolic encephalopathy   Hypomagnesemia   Hypokalemia   Seizure disorder (HCC)   Asthma, chronic   Essential hypertension  Resolved Problems:   * No resolved hospital problems. *   Hospital Course: 66 year old female with past medical history of asthma, hypertension, seizure disorder, gastroesophageal reflux disease presenting to [facility] emergency department with complaints of shortness of breath cough and fever.  Upon evaluation in the emergency department patient was found to exhibit multiple SIRS criteria including tachycardia, tachypnea and substantial leukocytosis.  Patient was additionally found to have a concurrent lactic acidosis.  CT imaging of the chest revealed a right lower lobe infiltrate concerning for pneumonia.  Patient was initiated on intravenous antibiotics with ceftriaxone and azithromycin and the hospitalist group was then called to assess the patient for admission to the hospital.  Workup revealed Streptococcus pneumoniae antigen positivity.  Blood cultures  revealed no evidence of growth however.  Patient was managed with intravenous antibiotics as well as aggressive intravenous volume resuscitation.  In the days that followed patient's SIRS criteria resolved and patient's mentation improved to baseline with improving oral intake.  The patient's lactic acidosis resolved with hydration.  In preparation for discharge, case was discussed with Dr. Lilia Pro patient's primary care provider with [facility].  Prior discussion, Dr. Lilia Pro was to provide the remaining course of the patient's home-going antibiotic therapy and ensure that was delivered directly to the patient's home.  Patient was discharged home on 12/15 in improved and stable condition.      Consultants: None Procedures performed: None  Disposition: Home Diet recommendation:  Discharge Diet Orders (From admission, onward)     Start     Ordered   09/10/22 0000  Diet - low sodium heart healthy        09/10/22 1508           Cardiac diet  DISCHARGE MEDICATION: Allergies as of 09/10/2022       Reactions   Kiwi Extract Hives, Itching   Morphine And Related Hives   Penicillins Hives, Other (See Comments)   Has patient had a PCN reaction causing immediate rash, facial/tongue/throat swelling, SOB or lightheadedness with hypotension: Yes Has patient had a PCN reaction causing severe rash involving mucus membranes or skin necrosis: No Has patient had a PCN reaction that required hospitalization No Has patient had a PCN reaction occurring within the last 10 years: No If all of the above answers are "NO", then may proceed with Cephalosporin use.   Strawberry Extract Hives, Itching        Medication List     TAKE these medications    albuterol 108 (  90 Base) MCG/ACT inhaler Commonly known as: VENTOLIN HFA Inhale 2 puffs into the lungs 2 (two) times daily as needed for wheezing or shortness of breath.   ALOE VERA MOISTURIZING EX Apply 1 application  topically 2 (two)  times daily as needed (to itchy areas of the scalp).   Aquagard Hydrating 41 % Oint Apply 1 application  topically See admin instructions. Apply to affected areas of the neck and scalp two to three times a day   Biofreeze 4 % Gel Generic drug: Menthol (Topical Analgesic) Apply 1 application  topically 3 (three) times daily as needed (to affected areas of the shoulders or other painful sites).   cefpodoxime 200 MG tablet Commonly known as: VANTIN Take 1 tablet (200 mg total) by mouth 2 (two) times daily for 4 doses. Start taking on: September 11, 2022   cyclobenzaprine 10 MG tablet Commonly known as: FLEXERIL Take 10 mg by mouth 2 (two) times daily as needed for muscle spasms.   divalproex 500 MG 24 hr tablet Commonly known as: DEPAKOTE ER Take 500 mg by mouth in the morning and at bedtime.   Ensure Plus High Protein Liqd Take 237 mLs by mouth 2 (two) times daily.   levETIRAcetam 750 MG tablet Commonly known as: KEPPRA Take 1 tablet (750 mg total) by mouth 2 (two) times daily.   losartan-hydrochlorothiazide 100-25 MG tablet Commonly known as: HYZAAR Take 1 tablet by mouth daily.   ondansetron 4 MG tablet Commonly known as: ZOFRAN Take 4 mg by mouth every 8 (eight) hours as needed for nausea or vomiting.   pantoprazole 40 MG tablet Commonly known as: PROTONIX Take 40 mg by mouth daily as needed (for GERD).   polyethylene glycol 17 g packet Commonly known as: MIRALAX / GLYCOLAX Take 17 g by mouth daily as needed (constipation.).   Salonpas Pain Relieving 4 % Generic drug: lidocaine Place 2-3 patches onto the skin daily as needed (for back pain- after removal of former ones).   Tylenol 8 Hour Arthritis Pain 650 MG CR tablet Generic drug: acetaminophen Take 650-1,300 mg by mouth every 8 (eight) hours as needed for pain (or aches- when not using the 500 mg strength). What changed: Another medication with the same name was removed. Continue taking this medication, and  follow the directions you see here.        Follow-up Information     [name], MD Follow up in 1 week(s).   Specialty: Family Medicine Contact information: [place] [id number] [phone]                 Discharge Exam: There were no vitals filed for this visit.  Constitutional: Awake alert and oriented x3, no associated distress.   Respiratory: Minimal bibasilar rales without wheezing.  Normal respiratory effort. No accessory muscle use.  Cardiovascular: Regular rate and rhythm, no murmurs / rubs / gallops. No extremity edema. 2+ pedal pulses. No carotid bruits.  Abdomen: Abdomen is soft and nontender.  No evidence of intra-abdominal masses.  Positive bowel sounds noted in all quadrants.   Musculoskeletal: No joint deformity upper and lower extremities. Good ROM, no contractures. Normal muscle tone.     Condition at discharge: fair  The results of significant diagnostics from this hospitalization (including imaging, microbiology, ancillary and laboratory) are listed below for reference.   Imaging Studies: DG Chest 1 View  Result Date: 09/09/2022 CLINICAL DATA:  Shortness of breath. EXAM: CHEST  1 VIEW COMPARISON:  Radiograph and CT  09/07/2022 FINDINGS: Developing left pleural effusion with increasing opacities at the left lung base. Progressive right infrahilar opacities. Upper normal heart size is likely accentuated by portable AP technique. No pulmonary edema. No pneumothorax. IMPRESSION: Developing left pleural effusion and increased bibasilar opacities, suspicious pneumonia, including aspiration. Electronically Signed   By: Keith Rake M.D.   On: 09/09/2022 17:17   CT Chest W Contrast  Result Date: 09/07/2022 CLINICAL DATA:  Sepsis. EXAM: CT CHEST WITH CONTRAST TECHNIQUE: Multidetector CT imaging of the chest was performed during intravenous contrast administration. RADIATION DOSE REDUCTION: This exam was performed according to the  departmental dose-optimization program which includes automated exposure control, adjustment of the mA and/or kV according to patient size and/or use of iterative reconstruction technique. CONTRAST:  38m OMNIPAQUE IOHEXOL 300 MG/ML  SOLN COMPARISON:  Chest radiograph earlier today. FINDINGS: Cardiovascular: The heart is normal in size. Moderate atherosclerosis of the thoracic aorta. No aneurysm. No evidence of acute aortic finding on this non CTA exam. No pericardial effusion. No central pulmonary embolus. Mediastinum/Nodes: No adenopathy. Patulous esophagus without wall thickening. Small right axillary and subpectoral lymph nodes, all subcentimeter short axis. Lungs/Pleura: Breathing motion artifact limits assessment, particularly at the lung bases. Mild apical emphysema. Prominent central bronchial thickening. Mucoid impaction of the left lower lobe. Minimal ground-glass opacity in the right upper lobe abuts the fissure. Dependent opacity in the right lower lobe which is somewhat nodular in appearance and may account for radiographic appearance, series 5, images 90-92. Peribronchovascular opacity in the dependent left lower lobe. Trachea and central airways are patent. Upper Abdomen: Multiple bilateral renal cysts, no further follow-up is needed. No acute upper abdominal findings. Musculoskeletal: There are no acute or suspicious osseous abnormalities. Exaggerated thoracic kyphosis. No chest wall soft tissue abnormalities. IMPRESSION: 1. Dependent airspace opacity in the right lower lobe which is somewhat nodular in appearance and likely accounts for radiographic appearance. This may represent pneumonia or aspiration. 2. Additional peribronchovascular opacities in the dependent left lower lobe. Minimal patchy ground-glass opacity in the perifissural right upper lobe. Favor infectious etiology. 3. Prominent central bronchial thickening, can be seen with bronchitis or reactive airways disease. Aortic  Atherosclerosis (ICD10-I70.0) and Emphysema (ICD10-J43.9). Electronically Signed   By: MKeith RakeM.D.   On: 09/07/2022 22:38   DG Chest 2 View  Result Date: 09/07/2022 CLINICAL DATA:  Shortness of breath, wheezing, fever. EXAM: CHEST - 2 VIEW COMPARISON:  09/09/2020 FINDINGS: Limited by positioning, the patient's chin obscures the left upper lung zone. Additionally the patient is moderately rotated and anti lordotic in positioning. The heart is normal in size. Possible 13 mm right basilar nodule. Peribronchial thickening. No significant pleural effusion. No pneumothorax. Exaggerated thoracic kyphosis. IMPRESSION: 1. Peribronchial thickening which may be bronchitis or asthma. 2. Possible 13 mm right basilar nodule. This is higher than expected for nipple shadow. Consider chest CT for characterization (can be performed on an elective basis based on clinical scenario). 3. Limited exam due to positioning and rotation. Electronically Signed   By: MKeith RakeM.D.   On: 09/07/2022 18:54    Microbiology: Results for orders placed or performed during the hospital encounter of 09/07/22  Resp panel by RT-PCR (RSV, Flu A&B, Covid) Anterior Nasal Swab     Status: None   Collection Time: 09/07/22  7:01 PM   Specimen: Anterior Nasal Swab  Result Value Ref Range Status   SARS Coronavirus 2 by RT PCR NEGATIVE NEGATIVE Final    Comment: (NOTE) SARS-CoV-2 target nucleic acids are  NOT DETECTED.  The SARS-CoV-2 RNA is generally detectable in upper respiratory specimens during the acute phase of infection. The lowest concentration of SARS-CoV-2 viral copies this assay can detect is 138 copies/mL. A negative result does not preclude SARS-Cov-2 infection and should not be used as the sole basis for treatment or other patient management decisions. A negative result may occur with  improper specimen collection/handling, submission of specimen other than nasopharyngeal swab, presence of viral mutation(s)  within the areas targeted by this assay, and inadequate number of viral copies(<138 copies/mL). A negative result must be combined with clinical observations, patient history, and epidemiological information. The expected result is Negative.  Fact Sheet for Patients:  EntrepreneurPulse.com.au  Fact Sheet for Healthcare Providers:  IncredibleEmployment.be  This test is no t yet approved or cleared by the [place] FDA and  has been authorized for detection and/or diagnosis of SARS-CoV-2 by FDA under an Emergency Use Authorization (EUA). This EUA will remain  in effect (meaning this test can be used) for the duration of the COVID-19 declaration under Section 564(b)(1) of the Act, 21 U.S.C.section 360bbb-3(b)(1), unless the authorization is terminated  or revoked sooner.       Influenza A by PCR NEGATIVE NEGATIVE Final   Influenza B by PCR NEGATIVE NEGATIVE Final    Comment: (NOTE) The Xpert Xpress SARS-CoV-2/FLU/RSV plus assay is intended as an aid in the diagnosis of influenza from Nasopharyngeal swab specimens and should not be used as a sole basis for treatment. Nasal washings and aspirates are unacceptable for Xpert Xpress SARS-CoV-2/FLU/RSV testing.  Fact Sheet for Patients: EntrepreneurPulse.com.au  Fact Sheet for Healthcare Providers: IncredibleEmployment.be  This test is not yet approved or cleared by the [place] FDA and has been authorized for detection and/or diagnosis of SARS-CoV-2 by FDA under an Emergency Use Authorization (EUA). This EUA will remain in effect (meaning this test can be used) for the duration of the COVID-19 declaration under Section 564(b)(1) of the Act, 21 U.S.C. section 360bbb-3(b)(1), unless the authorization is terminated or revoked.     Resp Syncytial Virus by PCR NEGATIVE NEGATIVE Final    Comment: (NOTE) Fact Sheet for  Patients: EntrepreneurPulse.com.au  Fact Sheet for Healthcare Providers: IncredibleEmployment.be  This test is not yet approved or cleared by the [place] FDA and has been authorized for detection and/or diagnosis of SARS-CoV-2 by FDA under an Emergency Use Authorization (EUA). This EUA will remain in effect (meaning this test can be used) for the duration of the COVID-19 declaration under Section 564(b)(1) of the Act, 21 U.S.C. section 360bbb-3(b)(1), unless the authorization is terminated or revoked.  Performed at [facility], [place] [address], [place], [place] [address]   Blood culture (routine x 2)     Status: None (Preliminary result)   Collection Time: 09/07/22  8:20 PM   Specimen: BLOOD  Result Value Ref Range Status   Specimen Description   Final    BLOOD LEFT ANTECUBITAL Performed at [facility] [address] [place], [place] [address]    Special Requests   Final    BOTTLES DRAWN AEROBIC AND ANAEROBIC Blood Culture results may not be optimal due to an excessive volume of blood received in culture bottles Performed at [facility] [address] [place], [place] [address]    Culture   Final    NO GROWTH 3 DAYS Performed at [facility], [place] [address], [place], [place] [address]    Report Status PENDING  Incomplete  Blood culture (routine x 2)     Status: None (Preliminary result)   Collection Time: 09/07/22  9:30 PM   Specimen: BLOOD  Result Value Ref Range Status   Specimen Description   Final    BLOOD BLOOD RIGHT HAND Performed at [facility] [address] [place], [place] [address]    Special Requests   Final    BOTTLES DRAWN AEROBIC ONLY Blood Culture adequate volume Performed at [facility] [address], [place], [place] [address]    Culture   Final    NO GROWTH 2 DAYS Performed at [facility] [address] [place], [place] [address]    Report Status PENDING  Incomplete  Surgical pcr screen     Status: None   Collection Time: 09/08/22  6:33 PM   Specimen: Nasal Mucosa; Nasal Swab  Result Value Ref Range Status   MRSA, PCR NEGATIVE NEGATIVE Final   Staphylococcus aureus NEGATIVE NEGATIVE Final    Comment: (NOTE) The Xpert SA Assay (FDA approved for NASAL specimens in patients 10 years of age and older), is one component of a comprehensive surveillance program. It is not intended to diagnose infection nor to guide or monitor treatment. Performed at [facility], [place] [address] [place], [place] [address]   C Difficile Quick Screen w PCR reflex     Status: None   Collection Time: 09/08/22  6:41 PM   Specimen: STOOL  Result Value Ref Range Status   C Diff antigen NEGATIVE NEGATIVE Final   C Diff toxin NEGATIVE NEGATIVE Final   C Diff interpretation No C. difficile detected.  Final    Comment: Performed at [facility], [place] [address], [place], [place] [address]  Gastrointestinal Panel by PCR , Stool     Status: None   Collection Time: 09/08/22  6:41 PM   Specimen: STOOL  Result Value Ref Range Status   Campylobacter species NOT DETECTED NOT DETECTED Final   Plesimonas shigelloides NOT DETECTED NOT DETECTED Final   Salmonella species NOT DETECTED NOT DETECTED Final   Yersinia enterocolitica NOT DETECTED NOT DETECTED Final   Vibrio species NOT DETECTED NOT DETECTED Final   Vibrio cholerae NOT DETECTED NOT DETECTED Final   Enteroaggregative E coli (EAEC) NOT DETECTED NOT DETECTED Final   Enteropathogenic E coli (EPEC) NOT DETECTED NOT DETECTED Final   Enterotoxigenic E coli (ETEC) NOT DETECTED NOT DETECTED Final   Shiga like toxin producing E coli (STEC) NOT DETECTED NOT DETECTED Final   Shigella/Enteroinvasive E coli (EIEC) NOT DETECTED NOT DETECTED Final   Cryptosporidium NOT DETECTED NOT DETECTED Final   Cyclospora cayetanensis  NOT DETECTED NOT DETECTED Final   Entamoeba histolytica NOT DETECTED NOT DETECTED Final   Giardia lamblia NOT DETECTED NOT DETECTED Final   Adenovirus F40/41 NOT DETECTED NOT DETECTED Final   Astrovirus NOT DETECTED NOT DETECTED Final   Norovirus GI/GII NOT DETECTED NOT DETECTED Final   Rotavirus A NOT DETECTED NOT DETECTED Final   Sapovirus (I, II, IV, and V) NOT DETECTED NOT DETECTED Final    Comment: Performed at [facility], [address] [place], [place] [address]    Labs: CBC: Recent Labs  Lab 09/07/22 2030 09/08/22 0615 09/09/22 1032 09/10/22 1007  WBC 19.2* 16.7* 12.7* 12.6*  NEUTROABS 15.4*  --  8.8* 8.4*  HGB 12.8 10.7* 10.6* 11.8*  HCT 40.7 33.9* 33.3* 38.0  MCV 90.8 91.1 92.2 93.6  PLT  405* 330 340 789   Basic Metabolic Panel: Recent Labs  Lab 09/07/22 2030 09/08/22 0615 09/08/22 0616 09/09/22 1032 09/10/22 1113  NA 142  --  143 140 140  K 3.4*  --  3.9 4.4 3.9  CL 105  --  107 109 102  CO2 25  --  '28 27 30  '$ GLUCOSE 119*  --  103* 118* 110*  BUN 15  --  '13 14 11  '$ CREATININE 0.95  --  0.67 0.69 0.52  CALCIUM 9.5  --  8.7* 9.0 9.4  MG  --  1.6*  --  2.0 1.5*   Liver Function Tests: Recent Labs  Lab 09/07/22 2030 09/09/22 1032 09/10/22 1113  AST 14* 20 16  ALT '10 14 13  '$ ALKPHOS 86 70 67  BILITOT 0.5 0.1* 0.1*  PROT 8.1 6.6 7.1  ALBUMIN 3.1* 2.4* 2.4*   CBG: No results for input(s): "GLUCAP" in the last 168 hours.  Discharge time spent: greater than 30 minutes.  Signed: Vernelle Emerald, MD [facility] 09/10/2022

## 2022-09-11 ENCOUNTER — Inpatient Hospital Stay (HOSPITAL_COMMUNITY)
Admission: EM | Admit: 2022-09-11 | Discharge: 2022-09-15 | DRG: 193 | Disposition: A | Discharge status: Home / Self Care | Payer: Medicare (Managed Care) | Attending: Student | Admitting: Student

## 2022-09-11 ENCOUNTER — Emergency Department (HOSPITAL_COMMUNITY): Payer: Medicare (Managed Care)

## 2022-09-11 DIAGNOSIS — J189 Pneumonia, unspecified organism: Secondary | ICD-10-CM | POA: Diagnosis not present

## 2022-09-11 DIAGNOSIS — Z91018 Allergy to other foods: Secondary | ICD-10-CM

## 2022-09-11 DIAGNOSIS — Z885 Allergy status to narcotic agent status: Secondary | ICD-10-CM

## 2022-09-11 DIAGNOSIS — Z79899 Other long term (current) drug therapy: Secondary | ICD-10-CM

## 2022-09-11 DIAGNOSIS — I10 Essential (primary) hypertension: Secondary | ICD-10-CM | POA: Diagnosis present

## 2022-09-11 DIAGNOSIS — Z8249 Family history of ischemic heart disease and other diseases of the circulatory system: Secondary | ICD-10-CM

## 2022-09-11 DIAGNOSIS — J9601 Acute respiratory failure with hypoxia: Secondary | ICD-10-CM | POA: Diagnosis present

## 2022-09-11 DIAGNOSIS — Z1152 Encounter for screening for COVID-19: Secondary | ICD-10-CM

## 2022-09-11 DIAGNOSIS — Z880 Allergy status to penicillin: Secondary | ICD-10-CM

## 2022-09-11 DIAGNOSIS — Z87891 Personal history of nicotine dependence: Secondary | ICD-10-CM

## 2022-09-11 DIAGNOSIS — R1319 Other dysphagia: Secondary | ICD-10-CM

## 2022-09-11 DIAGNOSIS — R1314 Dysphagia, pharyngoesophageal phase: Secondary | ICD-10-CM | POA: Diagnosis present

## 2022-09-11 DIAGNOSIS — Z5901 Sheltered homelessness: Secondary | ICD-10-CM

## 2022-09-11 DIAGNOSIS — K222 Esophageal obstruction: Secondary | ICD-10-CM | POA: Diagnosis present

## 2022-09-11 DIAGNOSIS — D638 Anemia in other chronic diseases classified elsewhere: Secondary | ICD-10-CM | POA: Diagnosis present

## 2022-09-11 DIAGNOSIS — K449 Diaphragmatic hernia without obstruction or gangrene: Secondary | ICD-10-CM | POA: Diagnosis present

## 2022-09-11 DIAGNOSIS — R0902 Hypoxemia: Secondary | ICD-10-CM | POA: Diagnosis not present

## 2022-09-11 DIAGNOSIS — R4189 Other symptoms and signs involving cognitive functions and awareness: Secondary | ICD-10-CM | POA: Diagnosis present

## 2022-09-11 DIAGNOSIS — Z5900 Homelessness unspecified: Secondary | ICD-10-CM

## 2022-09-11 DIAGNOSIS — Z833 Family history of diabetes mellitus: Secondary | ICD-10-CM

## 2022-09-11 DIAGNOSIS — R933 Abnormal findings on diagnostic imaging of other parts of digestive tract: Secondary | ICD-10-CM

## 2022-09-11 DIAGNOSIS — G40909 Epilepsy, unspecified, not intractable, without status epilepticus: Secondary | ICD-10-CM

## 2022-09-11 DIAGNOSIS — K2100 Gastro-esophageal reflux disease with esophagitis, without bleeding: Secondary | ICD-10-CM | POA: Diagnosis present

## 2022-09-11 LAB — COMPREHENSIVE METABOLIC PANEL
ALT: 14 U/L (ref 0–44)
AST: 18 U/L (ref 15–41)
Albumin: 2.9 g/dL — ABNORMAL LOW (ref 3.5–5.0)
Alkaline Phosphatase: 74 U/L (ref 38–126)
Anion gap: 9 (ref 5–15)
BUN: 9 mg/dL (ref 8–23)
CO2: 28 mmol/L (ref 22–32)
Calcium: 8.9 mg/dL (ref 8.9–10.3)
Chloride: 102 mmol/L (ref 98–111)
Creatinine, Ser: 0.56 mg/dL (ref 0.44–1.00)
GFR, Estimated: >60 mL/min (ref >60)
Glucose, Bld: 72 mg/dL (ref 70–99)
Potassium: 4.1 mmol/L (ref 3.5–5.1)
Sodium: 139 mmol/L (ref 135–145)
Total Bilirubin: 0.2 mg/dL — ABNORMAL LOW (ref 0.3–1.2)
Total Protein: 7.6 g/dL (ref 6.5–8.1)

## 2022-09-11 LAB — CBC WITH DIFFERENTIAL/PLATELET
Abs Immature Granulocytes: 0.45 10*3/uL — ABNORMAL HIGH (ref 0.00–0.07)
Basophils Absolute: 0 10*3/uL (ref 0.0–0.1)
Basophils Relative: 0 %
Eosinophils Absolute: 0.2 10*3/uL (ref 0.0–0.5)
Eosinophils Relative: 1 %
HCT: 38.8 % (ref 36.0–46.0)
Hemoglobin: 12.1 g/dL (ref 12.0–15.0)
Immature Granulocytes: 3 %
Lymphocytes Relative: 17 %
Lymphs Abs: 2.3 10*3/uL (ref 0.7–4.0)
MCH: 29.1 pg (ref 26.0–34.0)
MCHC: 31.2 g/dL (ref 30.0–36.0)
MCV: 93.3 fL (ref 80.0–100.0)
Monocytes Absolute: 1.6 10*3/uL — ABNORMAL HIGH (ref 0.1–1.0)
Monocytes Relative: 12 %
Neutro Abs: 8.7 10*3/uL — ABNORMAL HIGH (ref 1.7–7.7)
Neutrophils Relative %: 67 %
Platelets: 407 10*3/uL — ABNORMAL HIGH (ref 150–400)
RBC: 4.16 MIL/uL (ref 3.87–5.11)
RDW: 14.5 % (ref 11.5–15.5)
WBC: 13.3 10*3/uL — ABNORMAL HIGH (ref 4.0–10.5)
nRBC: 0 % (ref 0.0–0.2)

## 2022-09-11 LAB — RESP PANEL BY RT-PCR (RSV, FLU A&B, COVID)  RVPGX2
Influenza A by PCR: NEGATIVE
Influenza B by PCR: NEGATIVE
Resp Syncytial Virus by PCR: NEGATIVE
SARS Coronavirus 2 by RT PCR: NEGATIVE

## 2022-09-11 MED ORDER — ALBUTEROL SULFATE HFA 108 (90 BASE) MCG/ACT IN AERS: 2.0000 | INHALATION_SPRAY | RESPIRATORY_TRACT | Status: DC | PRN

## 2022-09-11 MED ORDER — SODIUM CHLORIDE 0.9 % IV SOLN: INTRAVENOUS | Status: DC

## 2022-09-11 NOTE — ED Provider Notes (Signed)
[facility] DEPT Provider Note   CSN: [id number] Arrival date & time: 09/11/22  1928     History  Chief Complaint  Patient presents with   Shortness of Breath    Kaytlan Behrman is a 66 y.o. female.  Patient brought in by EMS for shortness of breath.  EMS said that her oxygen sats on room air were 76%.  She was placed on 10 L nonrebreather O2 sats increased to 97%.  Patient alert and oriented.  Here at the hospital oxygen sats went down to 86% on room air placed back on nonrebreather.  We contacted respiratory to come in and just that now she is on high flow nasal cannula with good oxygen sats.  Patient was admitted December 12 through December 15 initially with concerns for sepsis and ended up being right lower lobe pneumonia.  Was treated with ceftriaxone and azithromycin.  Was discharged home on cefpodoxime.  Patient was not discharged home on oxygen as best we can tell certainly was not using oxygen at home.  Patient is followed by [facility].  Dr. Lilia Pro.  Patient was positive for strep pneumoniae.  Blood cultures were negative.  Past medical history significant for asthma history of alcohol abuse history of drug abuse recovered hypertension.  Had an abdominal hysterectomy.  Gallbladder surgery.  Patient is a former smoker of tobacco products.  History of alcohol abuse but not currently drinking.       Home Medications Prior to Admission medications   Medication Sig Start Date End Date Taking? Authorizing Provider  albuterol (VENTOLIN HFA) 108 (90 Base) MCG/ACT inhaler Inhale 2 puffs into the lungs 2 (two) times daily as needed for wheezing or shortness of breath.    [provider]  ALOE VERA MOISTURIZING EX Apply 1 application  topically 2 (two) times daily as needed (to itchy areas of the scalp).    [provider]  BIOFREEZE 4 % GEL Apply 1 application  topically 3 (three) times daily as needed (to affected areas of the shoulders or  other painful sites).    [provider]  cefpodoxime (VANTIN) 200 MG tablet Take 1 tablet (200 mg total) by mouth 2 (two) times daily for 4 doses. 09/11/22 09/13/22  Shalhoub, Sherryll Burger, MD  cyclobenzaprine (FLEXERIL) 10 MG tablet Take 10 mg by mouth 2 (two) times daily as needed for muscle spasms.    [provider]  divalproex (DEPAKOTE ER) 500 MG 24 hr tablet Take 500 mg by mouth in the morning and at bedtime.    [provider]  levETIRAcetam (KEPPRA) 750 MG tablet Take 1 tablet (750 mg total) by mouth 2 (two) times daily. 07/20/19   Virgel Manifold, MD  losartan-hydrochlorothiazide (HYZAAR) 100-25 MG tablet Take 1 tablet by mouth daily.    [provider]  Nutritional Supplements (ENSURE PLUS HIGH PROTEIN) LIQD Take 237 mLs by mouth 2 (two) times daily.    [provider]  ondansetron (ZOFRAN) 4 MG tablet Take 4 mg by mouth every 8 (eight) hours as needed for nausea or vomiting.    [provider]  pantoprazole (PROTONIX) 40 MG tablet Take 40 mg by mouth daily as needed (for GERD).    [provider]  polyethylene glycol (MIRALAX / GLYCOLAX) 17 g packet Take 17 g by mouth daily as needed (constipation.).    [provider]  SALONPAS PAIN RELIEVING 4 % Place 2-3 patches onto the skin daily as needed (for back pain- after removal of  former ones).    [provider]  Skin Protectants, Misc. (AQUAGARD HYDRATING) 41 % OINT Apply 1 application  topically See admin instructions. Apply to affected areas of the neck and scalp two to three times a day    [provider]  TYLENOL 8 HOUR ARTHRITIS PAIN 650 MG CR tablet Take 650-1,300 mg by mouth every 8 (eight) hours as needed for pain (or aches- when not using the 500 mg strength).    [provider]      Allergies    Kiwi extract, Morphine and related, Penicillins, and Strawberry extract    Review of Systems   Review of Systems  Constitutional:  Negative  for chills and fever.  HENT:  Negative for ear pain and sore throat.   Eyes:  Negative for pain and visual disturbance.  Respiratory:  Positive for cough and shortness of breath.   Cardiovascular:  Negative for chest pain and palpitations.  Gastrointestinal:  Negative for abdominal pain and vomiting.  Genitourinary:  Negative for dysuria and hematuria.  Musculoskeletal:  Negative for arthralgias and back pain.  Skin:  Negative for color change and rash.  Neurological:  Negative for seizures and syncope.  All other systems reviewed and are negative.   Physical Exam Updated Vital Signs BP 118/71   Pulse 88   Temp 98.7 F (37.1 C) (Oral)   Resp 20   SpO2 100%  Physical Exam Vitals and nursing note reviewed.  Constitutional:      General: She is in acute distress.     Appearance: She is well-developed. She is ill-appearing.  HENT:     Head: Normocephalic and atraumatic.  Eyes:     Extraocular Movements: Extraocular movements intact.     Conjunctiva/sclera: Conjunctivae normal.     Pupils: Pupils are equal, round, and reactive to light.  Cardiovascular:     Rate and Rhythm: Normal rate and regular rhythm.     Heart sounds: No murmur heard. Pulmonary:     Effort: Pulmonary effort is normal. No respiratory distress.     Breath sounds: Examination of the right-upper field reveals decreased breath sounds and rhonchi. Examination of the left-upper field reveals decreased breath sounds and rhonchi. Examination of the right-middle field reveals decreased breath sounds and rhonchi. Examination of the left-middle field reveals decreased breath sounds and rhonchi. Examination of the right-lower field reveals decreased breath sounds and rhonchi. Examination of the left-lower field reveals decreased breath sounds and rhonchi. Decreased breath sounds and rhonchi present. No wheezing or rales.  Chest:     Chest wall: No tenderness.  Abdominal:     Palpations: Abdomen is soft.     Tenderness:  There is no abdominal tenderness.  Musculoskeletal:        General: No swelling.     Cervical back: Neck supple.     Right lower leg: No edema.     Left lower leg: No edema.  Skin:    General: Skin is warm and dry.     Capillary Refill: Capillary refill takes less than 2 seconds.  Neurological:     General: No focal deficit present.     Mental Status: She is alert and oriented to person, place, and time.  Psychiatric:        Mood and Affect: Mood normal.     ED Results / Procedures / Treatments   Labs (all labs ordered are listed, but only abnormal results are displayed) Labs Reviewed  CBC WITH DIFFERENTIAL/PLATELET - Abnormal; Notable  for the following components:      Result Value   WBC 13.3 (*)    Platelets 407 (*)    Neutro Abs 8.7 (*)    Monocytes Absolute 1.6 (*)    Abs Immature Granulocytes 0.45 (*)    All other components within normal limits  COMPREHENSIVE METABOLIC PANEL - Abnormal; Notable for the following components:   Albumin 2.9 (*)    Total Bilirubin 0.2 (*)    All other components within normal limits  RESP PANEL BY RT-PCR (RSV, FLU A&B, COVID)  [id number]  CULTURE, BLOOD (ROUTINE X 2)  CULTURE, BLOOD (ROUTINE X 2)  LACTIC ACID, PLASMA  LACTIC ACID, PLASMA  VALPROIC ACID LEVEL  D-DIMER, QUANTITATIVE    EKG EKG Interpretation  Date/Time:  Saturday September 11 2022 22:32:04 EST Ventricular Rate:  88 PR Interval:  141 QRS Duration: 96 QT Interval:  398 QTC Calculation: 482 R Axis:   56 Text Interpretation: Sinus rhythm Confirmed by Fredia Sorrow [phone]) on 09/11/2022 10:41:55 PM  Radiology DG Chest Port 1 View  Result Date: 09/11/2022 CLINICAL DATA:  Shortness of breath and history of pneumonia EXAM: PORTABLE CHEST 1 VIEW COMPARISON:  Radiographs 09/09/2022 FINDINGS: Suboptimal exam secondary to patient kyphosis. The left mid and upper lung are obscured. Bibasilar atelectasis or infiltrates greater on the left. Question small left pleural  effusion. Findings are similar to 09/09/2022. IMPRESSION: Basilar opacities may be due to atelectasis or pneumonia. Question left pleural effusion. Electronically Signed   By: Placido Sou M.D.   On: 09/11/2022 22:36    Procedures Procedures    Medications Ordered in ED Medications  albuterol (VENTOLIN HFA) 108 (90 Base) MCG/ACT inhaler 2 puff (has no administration in time range)  0.9 %  sodium chloride infusion (has no administration in time range)    ED Course/ Medical Decision Making/ A&P                           Medical Decision Making Amount and/or Complexity of Data Reviewed Labs: ordered. Radiology: ordered.  Risk Prescription drug management. Decision regarding hospitalization.   CRITICAL CARE Performed by: Fredia Sorrow Total critical care time: 40 minutes Critical care time was exclusive of separately billable procedures and treating other patients. Critical care was necessary to treat or prevent imminent or life-threatening deterioration. Critical care was time spent personally by me on the following activities: development of treatment plan with patient and/or surrogate as well as nursing, discussions with consultants, evaluation of patient's response to treatment, examination of patient, obtaining history from patient or surrogate, ordering and performing treatments and interventions, ordering and review of laboratory studies, ordering and review of radiographic studies, pulse oximetry and re-evaluation of patient's condition.  Patient with hypoxia on room air.  Patient on high flow nasal cannula with good oxygen sats.  Patient CBC white count 13.3 not significantly changed from where she was.  Hemoglobin is 12.  Complete metabolic panel without any acute findings renal function normal with GFR greater than 60 normal anion gap.  COVID influenza testing negative.  Blood cultures pending lactic acids pending and D-dimers pending portable chest x-ray basilar opacities  may be due to either atelectasis or pneumonia question left pleural effusion.  They made comment that suboptimal exam secondary to patient kyphosis the left mid and upper lungs are obscured by base right close infiltrates greater on the left question small pleural effusion.  Findings are similar to an x-ray that was done  on December 14.  However with the hypoxia do want to go ahead and proceed with a CT chest to further evaluate this.  But since she has such a significant change in her oxygen saturations felt that we need to rule out pulmonary embolus.  So D-dimer was ordered.  Also they had trouble getting an IV which they are doing now.  Since there will probably be a delay on this D-dimer and she needs CT chest either way we will go ahead and order CT angio of the chest to rule out PE and get further evaluation of her lung status.  Clearly patient will need admission because of this new hypoxia.  Patient doing much better on oxygen.  Final Clinical Impression(s) / ED Diagnoses Final diagnoses:  Hypoxia    Rx / DC Orders ED Discharge Orders     None         Fredia Sorrow, MD 09/12/22 0001

## 2022-09-11 NOTE — ED Triage Notes (Signed)
Pt was discharged a few days ago for pneumonia. Pt arrived via EMS for SOB. EMS reports 76% O2 sats on RA at time of arrival. Placed on 10L NRB and O2 sats increased to 97. Pt Aox4. At hospital, pt O2 sats 86% RA, placed back on NRB

## 2022-09-12 ENCOUNTER — Encounter (HOSPITAL_COMMUNITY): Payer: Self-pay

## 2022-09-12 ENCOUNTER — Other Ambulatory Visit: Payer: Self-pay

## 2022-09-12 ENCOUNTER — Emergency Department (HOSPITAL_COMMUNITY): Payer: Medicare (Managed Care)

## 2022-09-12 DIAGNOSIS — R4189 Other symptoms and signs involving cognitive functions and awareness: Secondary | ICD-10-CM | POA: Diagnosis present

## 2022-09-12 DIAGNOSIS — Z833 Family history of diabetes mellitus: Secondary | ICD-10-CM | POA: Diagnosis not present

## 2022-09-12 DIAGNOSIS — K449 Diaphragmatic hernia without obstruction or gangrene: Secondary | ICD-10-CM | POA: Diagnosis present

## 2022-09-12 DIAGNOSIS — K219 Gastro-esophageal reflux disease without esophagitis: Secondary | ICD-10-CM | POA: Diagnosis not present

## 2022-09-12 DIAGNOSIS — R1314 Dysphagia, pharyngoesophageal phase: Secondary | ICD-10-CM | POA: Diagnosis present

## 2022-09-12 DIAGNOSIS — R1319 Other dysphagia: Secondary | ICD-10-CM | POA: Diagnosis not present

## 2022-09-12 DIAGNOSIS — R569 Unspecified convulsions: Secondary | ICD-10-CM | POA: Diagnosis not present

## 2022-09-12 DIAGNOSIS — Z885 Allergy status to narcotic agent status: Secondary | ICD-10-CM | POA: Diagnosis not present

## 2022-09-12 DIAGNOSIS — I10 Essential (primary) hypertension: Secondary | ICD-10-CM | POA: Diagnosis present

## 2022-09-12 DIAGNOSIS — D638 Anemia in other chronic diseases classified elsewhere: Secondary | ICD-10-CM | POA: Diagnosis present

## 2022-09-12 DIAGNOSIS — Z91018 Allergy to other foods: Secondary | ICD-10-CM | POA: Diagnosis not present

## 2022-09-12 DIAGNOSIS — Z1152 Encounter for screening for COVID-19: Secondary | ICD-10-CM | POA: Diagnosis not present

## 2022-09-12 DIAGNOSIS — J9601 Acute respiratory failure with hypoxia: Secondary | ICD-10-CM | POA: Diagnosis present

## 2022-09-12 DIAGNOSIS — Z880 Allergy status to penicillin: Secondary | ICD-10-CM | POA: Diagnosis not present

## 2022-09-12 DIAGNOSIS — Z8249 Family history of ischemic heart disease and other diseases of the circulatory system: Secondary | ICD-10-CM | POA: Diagnosis not present

## 2022-09-12 DIAGNOSIS — G40909 Epilepsy, unspecified, not intractable, without status epilepticus: Secondary | ICD-10-CM | POA: Diagnosis present

## 2022-09-12 DIAGNOSIS — R933 Abnormal findings on diagnostic imaging of other parts of digestive tract: Secondary | ICD-10-CM | POA: Diagnosis not present

## 2022-09-12 DIAGNOSIS — Z5901 Sheltered homelessness: Secondary | ICD-10-CM | POA: Diagnosis not present

## 2022-09-12 DIAGNOSIS — R0902 Hypoxemia: Secondary | ICD-10-CM | POA: Diagnosis present

## 2022-09-12 DIAGNOSIS — Z87891 Personal history of nicotine dependence: Secondary | ICD-10-CM | POA: Diagnosis not present

## 2022-09-12 DIAGNOSIS — J189 Pneumonia, unspecified organism: Secondary | ICD-10-CM | POA: Diagnosis present

## 2022-09-12 DIAGNOSIS — R1310 Dysphagia, unspecified: Secondary | ICD-10-CM | POA: Diagnosis not present

## 2022-09-12 DIAGNOSIS — K222 Esophageal obstruction: Secondary | ICD-10-CM | POA: Diagnosis present

## 2022-09-12 DIAGNOSIS — K2100 Gastro-esophageal reflux disease with esophagitis, without bleeding: Secondary | ICD-10-CM | POA: Diagnosis present

## 2022-09-12 DIAGNOSIS — Z79899 Other long term (current) drug therapy: Secondary | ICD-10-CM | POA: Diagnosis not present

## 2022-09-12 LAB — LACTIC ACID, PLASMA
Lactic Acid, Venous: 0.8 mmol/L (ref 0.5–1.9)
Lactic Acid, Venous: 0.7 mmol/L (ref 0.5–1.9)

## 2022-09-12 LAB — CBC
HCT: 31.9 % — ABNORMAL LOW (ref 36.0–46.0)
Hemoglobin: 10.1 g/dL — ABNORMAL LOW (ref 12.0–15.0)
MCH: 29 pg (ref 26.0–34.0)
MCHC: 31.7 g/dL (ref 30.0–36.0)
MCV: 91.7 fL (ref 80.0–100.0)
Platelets: 418 10*3/uL — ABNORMAL HIGH (ref 150–400)
RBC: 3.48 MIL/uL — ABNORMAL LOW (ref 3.87–5.11)
RDW: 14.6 % (ref 11.5–15.5)
WBC: 11.5 10*3/uL — ABNORMAL HIGH (ref 4.0–10.5)
nRBC: 0 % (ref 0.0–0.2)

## 2022-09-12 LAB — BASIC METABOLIC PANEL
Anion gap: 9 (ref 5–15)
BUN: 8 mg/dL (ref 8–23)
CO2: 30 mmol/L (ref 22–32)
Calcium: 8.5 mg/dL — ABNORMAL LOW (ref 8.9–10.3)
Chloride: 100 mmol/L (ref 98–111)
Creatinine, Ser: 0.56 mg/dL (ref 0.44–1.00)
GFR, Estimated: >60 mL/min (ref >60)
Glucose, Bld: 75 mg/dL (ref 70–99)
Potassium: 3.5 mmol/L (ref 3.5–5.1)
Sodium: 139 mmol/L (ref 135–145)

## 2022-09-12 LAB — CULTURE, BLOOD (ROUTINE X 2): Culture: NO GROWTH

## 2022-09-12 LAB — D-DIMER, QUANTITATIVE: D-Dimer, Quant: 1.16 ug/mL-FEU — ABNORMAL HIGH (ref 0.00–0.50)

## 2022-09-12 LAB — MAGNESIUM: Magnesium: 1.9 mg/dL (ref 1.7–2.4)

## 2022-09-12 LAB — VALPROIC ACID LEVEL: Valproic Acid Lvl: 45 ug/mL — ABNORMAL LOW (ref 50.0–100.0)

## 2022-09-12 LAB — PHOSPHORUS: Phosphorus: 3.9 mg/dL (ref 2.5–4.6)

## 2022-09-12 MED ORDER — SODIUM CHLORIDE 0.9 % IV SOLN
2.0000 g | INTRAVENOUS | Status: DC
  Administered 2022-09-12 – 2022-09-14 (×3): 2 g via INTRAVENOUS
  Filled 2022-09-12 (×3): qty 20

## 2022-09-12 MED ORDER — CYCLOBENZAPRINE HCL 10 MG PO TABS
10.0000 mg | ORAL_TABLET | Freq: Two times a day (BID) | ORAL | Status: DC | PRN
  Administered 2022-09-12 – 2022-09-14 (×5): 10 mg via ORAL
  Filled 2022-09-12 (×5): qty 1

## 2022-09-12 MED ORDER — PROCHLORPERAZINE EDISYLATE 10 MG/2ML IJ SOLN: 5.0000 mg | Freq: Four times a day (QID) | INTRAMUSCULAR | Status: DC | PRN

## 2022-09-12 MED ORDER — GUAIFENESIN 100 MG/5ML PO LIQD
5.0000 mL | ORAL | Status: DC | PRN
  Administered 2022-09-13: 5 mL via ORAL
  Filled 2022-09-12: qty 10

## 2022-09-12 MED ORDER — ALBUTEROL SULFATE (2.5 MG/3ML) 0.083% IN NEBU: 2.5000 mg | INHALATION_SOLUTION | Freq: Four times a day (QID) | RESPIRATORY_TRACT | Status: DC | PRN

## 2022-09-12 MED ORDER — ENOXAPARIN SODIUM 40 MG/0.4ML IJ SOSY
40.0000 mg | PREFILLED_SYRINGE | INTRAMUSCULAR | Status: DC
  Administered 2022-09-12 – 2022-09-13 (×2): 40 mg via SUBCUTANEOUS
  Filled 2022-09-12 (×2): qty 0.4

## 2022-09-12 MED ORDER — SODIUM CHLORIDE 3 % IN NEBU
4.0000 mL | INHALATION_SOLUTION | Freq: Two times a day (BID) | RESPIRATORY_TRACT | Status: AC
  Administered 2022-09-12 – 2022-09-14 (×6): 4 mL via RESPIRATORY_TRACT
  Filled 2022-09-12 (×6): qty 4

## 2022-09-12 MED ORDER — GUAIFENESIN ER 600 MG PO TB12
600.0000 mg | ORAL_TABLET | Freq: Two times a day (BID) | ORAL | Status: AC
  Administered 2022-09-12 – 2022-09-14 (×5): 600 mg via ORAL
  Filled 2022-09-12 (×4): qty 1

## 2022-09-12 MED ORDER — METRONIDAZOLE 500 MG/100ML IV SOLN
500.0000 mg | Freq: Two times a day (BID) | INTRAVENOUS | Status: DC
  Administered 2022-09-12 – 2022-09-15 (×7): 500 mg via INTRAVENOUS
  Filled 2022-09-12 (×7): qty 100

## 2022-09-12 MED ORDER — ACETAMINOPHEN 325 MG PO TABS
650.0000 mg | ORAL_TABLET | Freq: Four times a day (QID) | ORAL | Status: DC | PRN
  Administered 2022-09-12 – 2022-09-13 (×2): 650 mg via ORAL
  Filled 2022-09-12 (×2): qty 2

## 2022-09-12 MED ORDER — IPRATROPIUM-ALBUTEROL 0.5-2.5 (3) MG/3ML IN SOLN
3.0000 mL | Freq: Four times a day (QID) | RESPIRATORY_TRACT | Status: DC
  Administered 2022-09-12 (×3): 3 mL via RESPIRATORY_TRACT
  Filled 2022-09-12 (×5): qty 3

## 2022-09-12 MED ORDER — LEVETIRACETAM 500 MG PO TABS
750.0000 mg | ORAL_TABLET | Freq: Two times a day (BID) | ORAL | Status: DC
  Administered 2022-09-12 – 2022-09-15 (×6): 750 mg via ORAL
  Filled 2022-09-12 (×6): qty 1

## 2022-09-12 MED ORDER — SODIUM CHLORIDE 0.9 % IV SOLN
1.0000 g | Freq: Once | INTRAVENOUS | Status: AC
  Administered 2022-09-12: 1 g via INTRAVENOUS
  Filled 2022-09-12: qty 10

## 2022-09-12 MED ORDER — IOHEXOL 350 MG/ML SOLN
75.0000 mL | Freq: Once | INTRAVENOUS | Status: AC | PRN
  Administered 2022-09-12: 75 mL via INTRAVENOUS

## 2022-09-12 MED ORDER — METRONIDAZOLE 500 MG/100ML IV SOLN
500.0000 mg | Freq: Once | INTRAVENOUS | Status: AC
  Administered 2022-09-12: 500 mg via INTRAVENOUS
  Filled 2022-09-12: qty 100

## 2022-09-12 MED ORDER — POLYETHYLENE GLYCOL 3350 17 G PO PACK: 17.0000 g | PACK | Freq: Every day | ORAL | Status: DC | PRN

## 2022-09-12 MED ORDER — DIVALPROEX SODIUM ER 500 MG PO TB24
500.0000 mg | ORAL_TABLET | Freq: Every day | ORAL | Status: DC
  Administered 2022-09-12 – 2022-09-13 (×2): 500 mg via ORAL
  Filled 2022-09-12 (×2): qty 1

## 2022-09-12 NOTE — ED Notes (Signed)
Patient passed swallow screen. She was able to finish water without choking, coughing or stopping, MD made aware.

## 2022-09-12 NOTE — Progress Notes (Signed)
Pt seen and examined at bedside.   Lindsey [name] is a 66 y.o. female with medical history significant for hypertension, seizure disorder, GERD, just discharged from the hospital yesterday, treated for pneumonia, who presented with complaints of worsening shortness of breath and a nonproductive cough.  States her medications were not delivered to her home.  EMS was activated.  Upon EMS arrival she was noted to be hypoxic with O2 saturation in the 70s and was placed on nonrebreather.   In the ED, workup revealed no CT evidence of any embolism, dependent airspace opacities in the lower lobes with bronchial wall thickening and mucous plugging.  Findings may represent pneumonia and/or aspiration.  The patient was started empirically on Rocephin and IV Flagyl, allergic to penicillin.  Admitted by [facility], hospitalist service.      Pt admitted earlier by Dr Nevada Crane , please see note by Dr Nevada Crane.    General exam: Appears calm and comfortable  Respiratory system: scattered rhonchi. On [place] oxygen.  Cardiovascular system: S1 & S2 heard, RRR.  Gastrointestinal system: Abdomen is nondistended, soft and nontender.  Central nervous system: Alert and oriented. No focal neurological deficits. Extremities: Symmetric 5 x 5 power. Skin: No rashes,  Psychiatry:  Mood & affect appropriate.    Hosie Poisson MD

## 2022-09-12 NOTE — H&P (Signed)
History and Physical  [name] ZOX:[id number] DOB: July 04, 1956 DOA: 09/11/2022  Referring physician: Dr. Dina Rich, [place]. PCP: [name], MD  Outpatient Specialists: GI, plastic surgery, general surgery. Patient coming from: Home. Chief Complaint: Shortness of breath.  HPI: Lindsey [name] is a 66 y.o. female with medical history significant for hypertension, seizure disorder, GERD, just discharged from the hospital yesterday, treated for pneumonia, who presented with complaints of worsening shortness of breath and a nonproductive cough.  States her medications were not delivered to her home.  EMS was activated.  Upon EMS arrival she was noted to be hypoxic with O2 saturation in the 70s and was placed on nonrebreather.  In the ED, workup revealed no CT evidence of any embolism, dependent airspace opacities in the lower lobes with bronchial wall thickening and mucous plugging.  Findings may represent pneumonia and/or aspiration.  The patient was started empirically on Rocephin and IV Flagyl, allergic to penicillin.  Admitted by [facility], hospitalist service.    ED Course: Tmax 98.8.  BP 136/76, pulse 86, respiratory 16, saturation 100% on room air.  Lab studies markable for lactic acid negative x 2.  WBC 13.3 from 12.6 the day before.  Review of Systems: Review of systems as noted in the HPI. All other systems reviewed and are negative.   Past Medical History:  Diagnosis Date   Arthritis    Asthma    Drug abuse ([place])    recovered   GERD (gastroesophageal reflux disease)    H/O ETOH abuse    HTN (hypertension)    Seizure ([place])    Past Surgical History:  Procedure Laterality Date   ABDOMINAL HYSTERECTOMY     COLONOSCOPY WITH PROPOFOL N/A 03/18/2022   Procedure: COLONOSCOPY WITH PROPOFOL;  Surgeon: Milus Banister, MD;  Location: [facility];  Service: Gastroenterology;  Laterality: N/A;   GALLBLADDER SURGERY     peptic ulcer surgery     POLYPECTOMY  03/18/2022   Procedure:  POLYPECTOMY;  Surgeon: Milus Banister, MD;  Location: [facility];  Service: Gastroenterology;;    Social History:  reports that she has quit smoking. Her smoking use included cigarettes. She has never used smokeless tobacco. She reports that she does not currently use drugs after having used the following drugs: Marijuana. She reports that she does not drink alcohol.   Allergies  Allergen Reactions   Kiwi Extract Hives and Itching   Morphine And Related Hives   Penicillins Hives and Other (See Comments)    Has patient had a PCN reaction causing immediate rash, facial/tongue/throat swelling, SOB or lightheadedness with hypotension: Yes Has patient had a PCN reaction causing severe rash involving mucus membranes or skin necrosis: No Has patient had a PCN reaction that required hospitalization No Has patient had a PCN reaction occurring within the last 10 years: No If all of the above answers are "NO", then may proceed with Cephalosporin use.   Strawberry Extract Hives and Itching    Family History  Problem Relation Age of Onset   Seizures Mother    Alcohol abuse Mother    Heart attack Father    Drug abuse Brother    Pancreatic cancer Brother    Cancer Daughter        type unknown   Diabetes Daughter    Hypertension Daughter       Prior to Admission medications   Medication Sig Start Date End Date Taking? Authorizing Provider  albuterol (VENTOLIN HFA) 108 (90 Base) MCG/ACT inhaler Inhale 2 puffs into  the lungs 2 (two) times daily as needed for wheezing or shortness of breath.    [provider]  ALOE VERA MOISTURIZING EX Apply 1 application  topically 2 (two) times daily as needed (to itchy areas of the scalp).    [provider]  BIOFREEZE 4 % GEL Apply 1 application  topically 3 (three) times daily as needed (to affected areas of the shoulders or other painful sites).    [provider]  cefpodoxime (VANTIN) 200 MG tablet Take 1 tablet (200 mg  total) by mouth 2 (two) times daily for 4 doses. 09/11/22 09/13/22  Shalhoub, Sherryll Burger, MD  cyclobenzaprine (FLEXERIL) 10 MG tablet Take 10 mg by mouth 2 (two) times daily as needed for muscle spasms.    [provider]  divalproex (DEPAKOTE ER) 500 MG 24 hr tablet Take 500 mg by mouth in the morning and at bedtime.    [provider]  levETIRAcetam (KEPPRA) 750 MG tablet Take 1 tablet (750 mg total) by mouth 2 (two) times daily. 07/20/19   Virgel Manifold, MD  losartan-hydrochlorothiazide (HYZAAR) 100-25 MG tablet Take 1 tablet by mouth daily.    [provider]  Nutritional Supplements (ENSURE PLUS HIGH PROTEIN) LIQD Take 237 mLs by mouth 2 (two) times daily.    [provider]  ondansetron (ZOFRAN) 4 MG tablet Take 4 mg by mouth every 8 (eight) hours as needed for nausea or vomiting.    [provider]  pantoprazole (PROTONIX) 40 MG tablet Take 40 mg by mouth daily as needed (for GERD).    [provider]  polyethylene glycol (MIRALAX / GLYCOLAX) 17 g packet Take 17 g by mouth daily as needed (constipation.).    [provider]  SALONPAS PAIN RELIEVING 4 % Place 2-3 patches onto the skin daily as needed (for back pain- after removal of former ones).    [provider]  Skin Protectants, Misc. (AQUAGARD HYDRATING) 41 % OINT Apply 1 application  topically See admin instructions. Apply to affected areas of the neck and scalp two to three times a day    [provider]  TYLENOL 8 HOUR ARTHRITIS PAIN 650 MG CR tablet Take 650-1,300 mg by mouth every 8 (eight) hours as needed for pain (or aches- when not using the 500 mg strength).    [provider]    Physical Exam: BP 136/76   Pulse 86   Temp 97.6 F (36.4 C) (Oral)   Resp 16   SpO2 100%   General: 66 y.o. year-old female well developed well nourished in no acute distress.  Alert and oriented x3. Cardiovascular: Regular rate and rhythm with no rubs or  gallops.  No thyromegaly or JVD noted.  No lower extremity edema. 2/4 pulses in all 4 extremities. Respiratory: Mild rales at bases.  No wheezing noted.  Poor inspiratory effort. Abdomen: Soft nontender nondistended with normal bowel sounds x4 quadrants. Muskuloskeletal: No cyanosis, clubbing or edema noted bilaterally Neuro: CN II-XII intact, strength, sensation, reflexes Skin: No ulcerative lesions noted or rashes Psychiatry: Judgement and insight appear normal. Mood is appropriate for condition and setting          Labs on Admission:  Basic Metabolic Panel: Recent Labs  Lab 09/07/22 2030 09/08/22 0615 09/08/22 0616 09/09/22 1032 09/10/22 1113 09/11/22 2255  NA 142  --  143 140 140 139  K 3.4*  --  3.9 4.4 3.9 4.1  CL 105  --  107 109 102 102  CO2 25  --  '28 27 30 28  '$ GLUCOSE 119*  --  103* 118* 110* 72  BUN 15  --  '13 14 11 9  '$ CREATININE 0.95  --  0.67 0.69 0.52 0.56  CALCIUM 9.5  --  8.7* 9.0 9.4 8.9  MG  --  1.6*  --  2.0 1.5*  --    Liver Function Tests: Recent Labs  Lab 09/07/22 2030 09/09/22 1032 09/10/22 1113 09/11/22 2255  AST 14* '20 16 18  '$ ALT '10 14 13 14  '$ ALKPHOS 86 70 67 74  BILITOT 0.5 0.1* 0.1* 0.2*  PROT 8.1 6.6 7.1 7.6  ALBUMIN 3.1* 2.4* 2.4* 2.9*   Recent Labs  Lab 09/10/22 1113  LIPASE 29   No results for input(s): "AMMONIA" in the last 168 hours. CBC: Recent Labs  Lab 09/07/22 2030 09/08/22 0615 09/09/22 1032 09/10/22 1007 09/11/22 2255  WBC 19.2* 16.7* 12.7* 12.6* 13.3*  NEUTROABS 15.4*  --  8.8* 8.4* 8.7*  HGB 12.8 10.7* 10.6* 11.8* 12.1  HCT 40.7 33.9* 33.3* 38.0 38.8  MCV 90.8 91.1 92.2 93.6 93.3  PLT 405* 330 340 370 407*   Cardiac Enzymes: No results for input(s): "CKTOTAL", "CKMB", "CKMBINDEX", "TROPONINI" in the last 168 hours.  BNP (last 3 results) Recent Labs    09/10/22 1113  BNP 81.3    ProBNP (last 3 results) No results for input(s): "PROBNP" in the last 8760 hours.  CBG: No results for input(s): "GLUCAP"  in the last 168 hours.  Radiological Exams on Admission: CT Angio Chest PE W/Cm &/Or Wo Cm  Result Date: 09/12/2022 CLINICAL DATA:  Shortness of breath and low oxygen saturations. PE suspected. Elevated D-dimer. EXAM: CT ANGIOGRAPHY CHEST WITH CONTRAST TECHNIQUE: Multidetector CT imaging of the chest was performed using the standard protocol during bolus administration of intravenous contrast. Multiplanar CT image reconstructions and MIPs were obtained to evaluate the vascular anatomy. RADIATION DOSE REDUCTION: This exam was performed according to the departmental dose-optimization program which includes automated exposure control, adjustment of the mA and/or kV according to patient size and/or use of iterative reconstruction technique. CONTRAST:  73m OMNIPAQUE IOHEXOL 350 MG/ML SOLN COMPARISON:  Radiographs 09/11/2022 and CT chest 09/07/2022 FINDINGS: Cardiovascular: Satisfactory opacification of the pulmonary arteries to the segmental level. No evidence of pulmonary embolism. Normal heart size. No pericardial effusion. Aortic calcification Mediastinum/Nodes: No thoracic adenopathy by size. Thyroid gland, trachea, and esophagus demonstrate no significant findings. Lungs/Pleura: Paraseptal emphysema in the lung apices. Peribronchial wall thickening and mucous plugging greatest in the bilateral lower lobes. Peribronchovascular atelectasis/infiltrates in the lower lobes. These findings have increased since 09/07/2022. No pleural effusion or pneumothorax. Upper Abdomen: No acute abnormality. Musculoskeletal: No chest wall abnormality. No acute osseous findings. Exaggerated kyphosis. Review of the MIP images confirms the above findings. IMPRESSION: Negative for acute pulmonary embolism. Dependent airspace opacities in the lower lobes with bronchial wall thickening and mucous plugging. Findings may represent pneumonia and/or aspiration. Aortic Atherosclerosis (ICD10-I70.0) and Emphysema (ICD10-J43.9).  Electronically Signed   By: TPlacido SouM.D.   On: 09/12/2022 01:53   DG Chest Port 1 View  Result Date: 09/11/2022 CLINICAL DATA:  Shortness of breath and history of pneumonia EXAM: PORTABLE CHEST 1 VIEW COMPARISON:  Radiographs 09/09/2022 FINDINGS: Suboptimal exam secondary to patient kyphosis. The left mid and upper lung are obscured. Bibasilar atelectasis or infiltrates greater on the left. Question small left pleural effusion. Findings are similar to 09/09/2022. IMPRESSION: Basilar opacities may be due to atelectasis or pneumonia.  Question left pleural effusion. Electronically Signed   By: Placido Sou M.D.   On: 09/11/2022 22:36    EKG: I independently viewed the EKG done and my findings are as followed: Sinus rhythm rate of 88.  Nonspecific ST-T changes.  QTc 482.  Assessment/Plan Present on Admission:  Hypoxia  Principal Problem:   Hypoxia  Acute hypoxic respiratory failure secondary to pneumonia Not on oxygen supplementation at baseline Currently on 2 L to maintain O2 saturation above 92%. Wean off oxygen supplementation as tolerated. DuoNebs every 6 hours Hypersaline nebs daily, Mucinex twice daily  Recent streptococcal pneumonia Rocephin, IV Flagyl added due to concern for aspiration Speech therapist evaluation Aspiration precautions  Seizure disorder Resume home AEDs  GERD Resume home PPI   DVT prophylaxis: Subcu Lovenox daily  Code Status: Full code  Family Communication: None at bedside  Disposition Plan: Admitted to progressive care unit  Consults called: None  Admission status: Inpatient status    Status is: Inpatient The patient requires at least 2 midnights for further evaluation and treatment of her present condition.   Kayleen Memos MD [facility] Pager [phone]  If 7PM-7AM, please contact night-coverage [email or web address] Password [facility]  09/12/2022, 4:56 AM

## 2022-09-12 NOTE — ED Notes (Addendum)
O2 probe removed. RN placed new O2 probe to L ear and increaed O2 to 6L patient O2 staying around 98-100%   O2 change back to 2L and patient remains in 96-100%

## 2022-09-12 NOTE — Plan of Care (Signed)
  Problem: Education: Goal: Knowledge of General Education information will improve Description Including pain rating scale, medication(s)/side effects and non-pharmacologic comfort measures Outcome: Progressing   Problem: Health Behavior/Discharge Planning: Goal: Ability to manage health-related needs will improve Outcome: Progressing   

## 2022-09-12 NOTE — ED Notes (Addendum)
While RN was in room patient sats dropped to 70s-80s while on [place] and on 2L. Nurse changed out O2 prob and increased O2 to 4L patient currently sat at 96-100%

## 2022-09-13 ENCOUNTER — Inpatient Hospital Stay (HOSPITAL_COMMUNITY): Payer: Medicare (Managed Care)

## 2022-09-13 DIAGNOSIS — R0902 Hypoxemia: Secondary | ICD-10-CM | POA: Diagnosis not present

## 2022-09-13 DIAGNOSIS — K219 Gastro-esophageal reflux disease without esophagitis: Secondary | ICD-10-CM | POA: Diagnosis not present

## 2022-09-13 DIAGNOSIS — I10 Essential (primary) hypertension: Secondary | ICD-10-CM

## 2022-09-13 DIAGNOSIS — R569 Unspecified convulsions: Secondary | ICD-10-CM

## 2022-09-13 DIAGNOSIS — R1310 Dysphagia, unspecified: Secondary | ICD-10-CM | POA: Diagnosis not present

## 2022-09-13 LAB — CULTURE, BLOOD (ROUTINE X 2)
Culture: NO GROWTH
Special Requests: ADEQUATE

## 2022-09-13 MED ORDER — DIVALPROEX SODIUM ER 500 MG PO TB24
500.0000 mg | ORAL_TABLET | Freq: Two times a day (BID) | ORAL | Status: DC
  Administered 2022-09-13 – 2022-09-15 (×3): 500 mg via ORAL
  Filled 2022-09-13 (×3): qty 1

## 2022-09-13 MED ORDER — ENOXAPARIN SODIUM 40 MG/0.4ML IJ SOSY
40.0000 mg | PREFILLED_SYRINGE | INTRAMUSCULAR | Status: DC
  Administered 2022-09-14: 40 mg via SUBCUTANEOUS
  Filled 2022-09-13: qty 0.4

## 2022-09-13 MED ORDER — OXYCODONE-ACETAMINOPHEN 5-325 MG PO TABS
1.0000 | ORAL_TABLET | ORAL | Status: DC | PRN
  Administered 2022-09-14 – 2022-09-15 (×2): 2 via ORAL
  Filled 2022-09-13: qty 1
  Filled 2022-09-13 (×2): qty 2

## 2022-09-13 MED ORDER — LIP MEDEX EX OINT
TOPICAL_OINTMENT | CUTANEOUS | Status: DC | PRN
  Filled 2022-09-13 (×2): qty 7

## 2022-09-13 MED ORDER — PANTOPRAZOLE SODIUM 40 MG IV SOLR
40.0000 mg | Freq: Two times a day (BID) | INTRAVENOUS | Status: DC
  Administered 2022-09-13 – 2022-09-15 (×4): 40 mg via INTRAVENOUS
  Filled 2022-09-13 (×4): qty 10

## 2022-09-13 MED ORDER — IPRATROPIUM-ALBUTEROL 0.5-2.5 (3) MG/3ML IN SOLN
3.0000 mL | Freq: Two times a day (BID) | RESPIRATORY_TRACT | Status: DC
  Administered 2022-09-13 – 2022-09-15 (×5): 3 mL via RESPIRATORY_TRACT
  Filled 2022-09-13 (×4): qty 3

## 2022-09-13 MED ORDER — IPRATROPIUM-ALBUTEROL 0.5-2.5 (3) MG/3ML IN SOLN: 3.0000 mL | Freq: Four times a day (QID) | RESPIRATORY_TRACT | Status: DC | PRN

## 2022-09-13 NOTE — Consult Note (Addendum)
Consultation  Referring Provider:   Dr. Karleen Hampshire   Primary Care Physician:  [name], MD Primary Gastroenterologist: Dr. Ardis Hughs        Reason for Consultation: Dysphagia             HPI:   Lindsey [name] is a 66 y.o. female with a past medical history significant for hypertension, seizure disorder, GERD and multiple others, who presented to the ER on 09/11/2022 for shortness of breath.  Apparently had been discharged from the hospital the day before after being treated for pneumonia but had worsening shortness of breath and a nonproductive cough.  We are consulted now in regards to dysphagia.    Per hospitalist note from today patient will be continued on IV antibiotics for another 24 hours, she was weaned off of her oxygen today with improving leukocytosis.  Patient reported dysphagia to solids.    Today, patient explains that over the past month she has had trouble with dysphagia explaining that any time she tries to eat something solid it seems to not even make it down to her stomach before it comes back up.  She is still able to keep down all liquids but cannot eat anything that has any solid part to it at all.  Does explain some chronic reflux but is not on medication for this normally.  No prior EGD in the past.  Associated symptoms include a vague right upper quadrant/right lower lung pain at the moment which is "right up under my breast", she describes it as a chronic discomfort that sometimes "makes me cry".  This also started when she was diagnosed with pneumonia.    Denies fever, chills, weight loss, change in bowel habits, nausea or vomiting.  ER course: No CT evidence of any pulm embolism, dependent airspace opacities in the lower lobes with bronchial wall thickening and mucous plugging, findings thought to represent pneumonia and/or aspiration, started on Rocephin and IV Flagyl, allergic to Penicillin  GI history: 03/18/2022 colonoscopy with Dr. Ardis Hughs with three 6-11 mm  polyps in the transverse and ascending colon; pathology showed tubular adenomas; recall recommended in 3 years  Past Medical History:  Diagnosis Date   Arthritis    Asthma    Drug abuse ([place])    recovered   GERD (gastroesophageal reflux disease)    H/O ETOH abuse    HTN (hypertension)    Seizure ([place])     Past Surgical History:  Procedure Laterality Date   ABDOMINAL HYSTERECTOMY     COLONOSCOPY WITH PROPOFOL N/A 03/18/2022   Procedure: COLONOSCOPY WITH PROPOFOL;  Surgeon: Milus Banister, MD;  Location: [facility];  Service: Gastroenterology;  Laterality: N/A;   GALLBLADDER SURGERY     peptic ulcer surgery     POLYPECTOMY  03/18/2022   Procedure: POLYPECTOMY;  Surgeon: Milus Banister, MD;  Location: [facility];  Service: Gastroenterology;;    Family History  Problem Relation Age of Onset   Seizures Mother    Alcohol abuse Mother    Heart attack Father    Drug abuse Brother    Pancreatic cancer Brother    Cancer Daughter        type unknown   Diabetes Daughter    Hypertension Daughter     Social History   Tobacco Use   Smoking status: Former    Types: Cigarettes   Smokeless tobacco: Never  Vaping Use   Vaping Use: Never used  Substance Use Topics   Alcohol use: No  Comment: Hx of alchol abuse, not currently drinking   Drug use: Not Currently    Types: Marijuana    Comment: "every now and then"    Prior to Admission medications   Medication Sig Start Date End Date Taking? Authorizing Provider  albuterol (VENTOLIN HFA) 108 (90 Base) MCG/ACT inhaler Inhale 2 puffs into the lungs 2 (two) times daily as needed for wheezing or shortness of breath.   Yes [provider]  ALOE VERA MOISTURIZING EX Apply 1 application  topically 2 (two) times daily as needed (to itchy areas of the scalp).   Yes [provider]  BIOFREEZE 4 % GEL Apply 1 application  topically 3 (three) times daily as needed (to affected areas of the shoulders or other painful  sites).   Yes [provider]  cyclobenzaprine (FLEXERIL) 10 MG tablet Take 10 mg by mouth 2 (two) times daily as needed for muscle spasms.   Yes [provider]  divalproex (DEPAKOTE ER) 500 MG 24 hr tablet Take 500 mg by mouth in the morning and at bedtime.   Yes [provider]  levETIRAcetam (KEPPRA) 750 MG tablet Take 1 tablet (750 mg total) by mouth 2 (two) times daily. 07/20/19  Yes Virgel Manifold, MD  losartan-hydrochlorothiazide (HYZAAR) 100-25 MG tablet Take 1 tablet by mouth daily.   Yes [provider]  ondansetron (ZOFRAN) 4 MG tablet Take 4 mg by mouth every 8 (eight) hours as needed for nausea or vomiting.   Yes [provider]  pantoprazole (PROTONIX) 40 MG tablet Take 40 mg by mouth daily as needed (for GERD).   Yes [provider]  SALONPAS PAIN RELIEVING 4 % Place 2-3 patches onto the skin daily as needed (for back pain- after removal of former ones).   Yes [provider]  Skin Protectants, Misc. (AQUAGARD HYDRATING) 41 % OINT Apply 1 application  topically See admin instructions. Apply to affected areas of the neck and scalp two to three times a day   Yes [provider]  TYLENOL 8 HOUR ARTHRITIS PAIN 650 MG CR tablet Take 650-1,300 mg by mouth every 8 (eight) hours as needed for pain (or aches- when not using the 500 mg strength).   Yes [provider]  cefpodoxime (VANTIN) 200 MG tablet Take 1 tablet (200 mg total) by mouth 2 (two) times daily for 4 doses. Patient not taking: Reported on 09/12/2022 09/11/22 09/13/22  Vernelle Emerald, MD  Nutritional Supplements (ENSURE PLUS HIGH PROTEIN) LIQD Take 237 mLs by mouth 2 (two) times daily.    [provider]    Current Facility-Administered Medications  Medication Dose Route Frequency Provider Last Rate Last Admin   0.9 %  sodium chloride infusion   Intravenous Continuous Fredia Sorrow, MD 100 mL/hr at 09/13/22 0510 New Bag at 09/13/22  0510   acetaminophen (TYLENOL) tablet 650 mg  650 mg Oral Q6H PRN Kayleen Memos, DO   650 mg at 09/12/22 2058   albuterol (PROVENTIL) (2.5 MG/3ML) 0.083% nebulizer solution 2.5 mg  2.5 mg Nebulization Q6H PRN Irene Pap N, DO       cefTRIAXone (ROCEPHIN) 2 g in sodium chloride 0.9 % 100 mL IVPB  2 g Intravenous Q24H Hall, Carole N, DO   Stopped at 09/12/22 2251   cyclobenzaprine (FLEXERIL) tablet 10 mg  10 mg Oral BID PRN Irene Pap N, DO   10 mg at 09/13/22 1023   divalproex (DEPAKOTE ER) 24 hr tablet 500 mg  500  mg Oral Daily Kayleen Memos, DO   500 mg at 09/13/22 1024   enoxaparin (LOVENOX) injection 40 mg  40 mg Subcutaneous Q24H Irene Pap N, DO   40 mg at 09/13/22 1024   guaiFENesin (MUCINEX) 12 hr tablet 600 mg  600 mg Oral BID Irene Pap N, DO   600 mg at 09/13/22 1024   guaiFENesin (ROBITUSSIN) 100 MG/5ML liquid 5 mL  5 mL Oral Q4H PRN Hall, Carole N, DO       ipratropium-albuterol (DUONEB) 0.5-2.5 (3) MG/3ML nebulizer solution 3 mL  3 mL Nebulization Q6H PRN Hosie Poisson, MD       ipratropium-albuterol (DUONEB) 0.5-2.5 (3) MG/3ML nebulizer solution 3 mL  3 mL Nebulization BID Hosie Poisson, MD   3 mL at 09/13/22 0939   levETIRAcetam (KEPPRA) tablet 750 mg  750 mg Oral BID Irene Pap N, DO   750 mg at 09/13/22 1024   lip balm (CARMEX) ointment   Topical PRN Hosie Poisson, MD       metroNIDAZOLE (FLAGYL) IVPB 500 mg  500 mg Intravenous Q12H Irene Pap N, DO 100 mL/hr at 09/13/22 1030 500 mg at 09/13/22 1030   polyethylene glycol (MIRALAX / GLYCOLAX) packet 17 g  17 g Oral Daily PRN Irene Pap N, DO       prochlorperazine (COMPAZINE) injection 5 mg  5 mg Intravenous Q6H PRN Hall, Carole N, DO       sodium chloride HYPERTONIC 3 % nebulizer solution 4 mL  4 mL Nebulization BID Irene Pap N, DO   4 mL at 09/13/22 0932    Allergies as of 09/11/2022 - Review Complete 09/11/2022  Allergen Reaction Noted   Kiwi extract Hives and Itching 11/03/2015   Morphine and related Hives  11/05/2013   Penicillins Hives and Other (See Comments) 02/25/2012   Strawberry extract Hives and Itching 04/25/2015     Review of Systems:    Constitutional: No weight loss, fever or chills Skin: No rash  Cardiovascular: No chest pain Respiratory: +SOB Gastrointestinal: See HPI and otherwise negative Genitourinary: No dysuria  Neurological: No headache, dizziness or syncope Musculoskeletal: No new muscle or joint pain Hematologic: No bleeding  Psychiatric: No history of depression or anxiety    Physical Exam:  Vital signs in last 24 hours: Temp:  [97.4 F (36.3 C)-98.6 F (37 C)] 98.6 F (37 C) (12/18 0537) Pulse Rate:  [70-97] 88 (12/18 0537) Resp:  [20-22] 20 (12/18 0537) BP: (116-141)/(57-69) 117/57 (12/18 0537) SpO2:  [76 %-100 %] 100 % (12/18 0914) FiO2 (%):  [28 %] 28 % (12/18 0537) Weight:  [80.9 kg] 80.9 kg (12/17 1507) Last BM Date : 09/12/22 General:   Pleasant AA female appears to be in NAD, Well developed, Well nourished, alert and cooperative Head:  Normocephalic and atraumatic. Eyes:   PEERL, EOMI. No icterus. Conjunctiva pink. Ears:  Normal auditory acuity. Neck:  Supple Throat: Oral cavity and pharynx without inflammation, swelling or lesion.+edentulous Lungs: Respirations even and unlabored. Lungs clear to auscultation bilaterally.   No wheezes, crackles, or rhonchi.  Heart: Normal S1, S2. No MRG. Regular rate and rhythm. No peripheral edema, cyanosis or pallor.  Abdomen:  Soft, nondistended, +mild RUQ ttp, No rebound or guarding. Normal bowel sounds. No appreciable masses or hepatomegaly. Rectal:  Not performed.  Msk:  Symmetrical without gross deformities. Peripheral pulses intact.  Extremities:  Without edema, no deformity or joint abnormality.  Neurologic:  Alert and  oriented x4;  grossly normal neurologically.  Skin:  Dry and intact without significant lesions or rashes. Psychiatric: Demonstrates good judgement and reason without abnormal affect  or behaviors.   LAB RESULTS: Recent Labs    09/11/22 2255 09/12/22 0515  WBC 13.3* 11.5*  HGB 12.1 10.1*  HCT 38.8 31.9*  PLT 407* 418*   BMET Recent Labs    09/10/22 1113 09/11/22 2255 09/12/22 0600  NA 140 139 139  K 3.9 4.1 3.5  CL 102 102 100  CO2 '30 28 30  '$ GLUCOSE 110* 72 75  BUN '11 9 8  '$ CREATININE 0.52 0.56 0.56  CALCIUM 9.4 8.9 8.5*   LFT Recent Labs    09/11/22 2255  PROT 7.6  ALBUMIN 2.9*  AST 18  ALT 14  ALKPHOS 74  BILITOT 0.2*    STUDIES: CT Angio Chest PE W/Cm &/Or Wo Cm  Result Date: 09/12/2022 CLINICAL DATA:  Shortness of breath and low oxygen saturations. PE suspected. Elevated D-dimer. EXAM: CT ANGIOGRAPHY CHEST WITH CONTRAST TECHNIQUE: Multidetector CT imaging of the chest was performed using the standard protocol during bolus administration of intravenous contrast. Multiplanar CT image reconstructions and MIPs were obtained to evaluate the vascular anatomy. RADIATION DOSE REDUCTION: This exam was performed according to the departmental dose-optimization program which includes automated exposure control, adjustment of the mA and/or kV according to patient size and/or use of iterative reconstruction technique. CONTRAST:  70m OMNIPAQUE IOHEXOL 350 MG/ML SOLN COMPARISON:  Radiographs 09/11/2022 and CT chest 09/07/2022 FINDINGS: Cardiovascular: Satisfactory opacification of the pulmonary arteries to the segmental level. No evidence of pulmonary embolism. Normal heart size. No pericardial effusion. Aortic calcification Mediastinum/Nodes: No thoracic adenopathy by size. Thyroid gland, trachea, and esophagus demonstrate no significant findings. Lungs/Pleura: Paraseptal emphysema in the lung apices. Peribronchial wall thickening and mucous plugging greatest in the bilateral lower lobes. Peribronchovascular atelectasis/infiltrates in the lower lobes. These findings have increased since 09/07/2022. No pleural effusion or pneumothorax. Upper Abdomen: No acute  abnormality. Musculoskeletal: No chest wall abnormality. No acute osseous findings. Exaggerated kyphosis. Review of the MIP images confirms the above findings. IMPRESSION: Negative for acute pulmonary embolism. Dependent airspace opacities in the lower lobes with bronchial wall thickening and mucous plugging. Findings may represent pneumonia and/or aspiration. Aortic Atherosclerosis (ICD10-I70.0) and Emphysema (ICD10-J43.9). Electronically Signed   By: TPlacido SouM.D.   On: 09/12/2022 01:53   DG Chest Port 1 View  Result Date: 09/11/2022 CLINICAL DATA:  Shortness of breath and history of pneumonia EXAM: PORTABLE CHEST 1 VIEW COMPARISON:  Radiographs 09/09/2022 FINDINGS: Suboptimal exam secondary to patient kyphosis. The left mid and upper lung are obscured. Bibasilar atelectasis or infiltrates greater on the left. Question small left pleural effusion. Findings are similar to 09/09/2022. IMPRESSION: Basilar opacities may be due to atelectasis or pneumonia. Question left pleural effusion. Electronically Signed   By: TPlacido SouM.D.   On: 09/11/2022 22:36     Impression / Plan:   Impression: 1.  Dysphagia: For the past month per the patient, longer symptoms of reflux which is untreated, also edentulous which is likely contributing; consider peptic stricture versus dysmotility versus other 2.  Acute hypoxic respiratory failure secondary to pneumonia: Not on O2 supplementation at baseline but currently on 2 L to maintain O2 saturation above 92% 3.  Recent streptococcal pneumonia: Currently on Rocephin, IV Flagyl added due to concern for aspiration 4.  Seizure disorder 5.  GERD: Patient reports she does not take medication for this at home, has been going on for years off-and-on 6.  Anemia of chronic  disease: Hemoglobin stable around 10  Plan: 1.  It appears her respiratory status is slowly improving, no longer needing continuous oxygen supplementation. 2.  Start her workup with a barium  esophagram with tablet for further evaluation of her dysphagia.  Pending this may need an EGD. 3.  For now patient can stay on clear/full liquid diet 4.  Started the patient on Pantoprazole 40 mg IV twice a day 5.  Treatment for her pneumonia per hospitalist team 6.  Will need to hold Lovenox if EGD is pursued  Thank you for your kind consultation, we will continue to follow.  Lavone Nian [facility]  09/13/2022, 11:09 AM     Attending Physician Note   I have taken a history, reviewed the chart and examined the patient. I performed a substantive portion of this encounter, including complete performance of at least one of the key components, in conjunction with the APP. I agree with the APP's note, impression and recommendations with my edits. My additional impressions and recommendations are as follows.   *Dysphagia, GERD. R/O stricture, esophageal dysmotility, esophagitis. Start pantoprazole 40 mg IV bid. Schedule barium esophagram. Pending esophagram results may need EGD.   *Acute hypoxia respiratory failure secondary to pneumonia  *Anemia of chronic disease  Lucio Edward, MD [facility] See [facility], [facility], for our on call provider

## 2022-09-13 NOTE — H&P (View-Only) (Signed)
Consultation  Referring Provider:   Dr. Karleen Hampshire   Primary Care Physician:  [name], MD Primary Gastroenterologist: Dr. Ardis Hughs        Reason for Consultation: Dysphagia             HPI:   Brieana Shimmin is a 66 y.o. female with a past medical history significant for hypertension, seizure disorder, GERD and multiple others, who presented to the ER on 09/11/2022 for shortness of breath.  Apparently had been discharged from the hospital the day before after being treated for pneumonia but had worsening shortness of breath and a nonproductive cough.  We are consulted now in regards to dysphagia.    Per hospitalist note from today patient will be continued on IV antibiotics for another 24 hours, she was weaned off of her oxygen today with improving leukocytosis.  Patient reported dysphagia to solids.    Today, patient explains that over the past month she has had trouble with dysphagia explaining that any time she tries to eat something solid it seems to not even make it down to her stomach before it comes back up.  She is still able to keep down all liquids but cannot eat anything that has any solid part to it at all.  Does explain some chronic reflux but is not on medication for this normally.  No prior EGD in the past.  Associated symptoms include a vague right upper quadrant/right lower lung pain at the moment which is "right up under my breast", she describes it as a chronic discomfort that sometimes "makes me cry".  This also started when she was diagnosed with pneumonia.    Denies fever, chills, weight loss, change in bowel habits, nausea or vomiting.  ER course: No CT evidence of any pulm embolism, dependent airspace opacities in the lower lobes with bronchial wall thickening and mucous plugging, findings thought to represent pneumonia and/or aspiration, started on Rocephin and IV Flagyl, allergic to Penicillin  GI history: 03/18/2022 colonoscopy with Dr. Ardis Hughs with three 6-11 mm  polyps in the transverse and ascending colon; pathology showed tubular adenomas; recall recommended in 3 years  Past Medical History:  Diagnosis Date   Arthritis    Asthma    Drug abuse ([place])    recovered   GERD (gastroesophageal reflux disease)    H/O ETOH abuse    HTN (hypertension)    Seizure ([place])     Past Surgical History:  Procedure Laterality Date   ABDOMINAL HYSTERECTOMY     COLONOSCOPY WITH PROPOFOL N/A 03/18/2022   Procedure: COLONOSCOPY WITH PROPOFOL;  Surgeon: Milus Banister, MD;  Location: [facility];  Service: Gastroenterology;  Laterality: N/A;   GALLBLADDER SURGERY     peptic ulcer surgery     POLYPECTOMY  03/18/2022   Procedure: POLYPECTOMY;  Surgeon: Milus Banister, MD;  Location: [facility];  Service: Gastroenterology;;    Family History  Problem Relation Age of Onset   Seizures Mother    Alcohol abuse Mother    Heart attack Father    Drug abuse Brother    Pancreatic cancer Brother    Cancer Daughter        type unknown   Diabetes Daughter    Hypertension Daughter     Social History   Tobacco Use   Smoking status: Former    Types: Cigarettes   Smokeless tobacco: Never  Vaping Use   Vaping Use: Never used  Substance Use Topics   Alcohol use: No  Comment: Hx of alchol abuse, not currently drinking   Drug use: Not Currently    Types: Marijuana    Comment: "every now and then"    Prior to Admission medications   Medication Sig Start Date End Date Taking? Authorizing Provider  albuterol (VENTOLIN HFA) 108 (90 Base) MCG/ACT inhaler Inhale 2 puffs into the lungs 2 (two) times daily as needed for wheezing or shortness of breath.   Yes [provider]  ALOE VERA MOISTURIZING EX Apply 1 application  topically 2 (two) times daily as needed (to itchy areas of the scalp).   Yes [provider]  BIOFREEZE 4 % GEL Apply 1 application  topically 3 (three) times daily as needed (to affected areas of the shoulders or other painful  sites).   Yes [provider]  cyclobenzaprine (FLEXERIL) 10 MG tablet Take 10 mg by mouth 2 (two) times daily as needed for muscle spasms.   Yes [provider]  divalproex (DEPAKOTE ER) 500 MG 24 hr tablet Take 500 mg by mouth in the morning and at bedtime.   Yes [provider]  levETIRAcetam (KEPPRA) 750 MG tablet Take 1 tablet (750 mg total) by mouth 2 (two) times daily. 07/20/19  Yes Virgel Manifold, MD  losartan-hydrochlorothiazide (HYZAAR) 100-25 MG tablet Take 1 tablet by mouth daily.   Yes [provider]  ondansetron (ZOFRAN) 4 MG tablet Take 4 mg by mouth every 8 (eight) hours as needed for nausea or vomiting.   Yes [provider]  pantoprazole (PROTONIX) 40 MG tablet Take 40 mg by mouth daily as needed (for GERD).   Yes [provider]  SALONPAS PAIN RELIEVING 4 % Place 2-3 patches onto the skin daily as needed (for back pain- after removal of former ones).   Yes [provider]  Skin Protectants, Misc. (AQUAGARD HYDRATING) 41 % OINT Apply 1 application  topically See admin instructions. Apply to affected areas of the neck and scalp two to three times a day   Yes [provider]  TYLENOL 8 HOUR ARTHRITIS PAIN 650 MG CR tablet Take 650-1,300 mg by mouth every 8 (eight) hours as needed for pain (or aches- when not using the 500 mg strength).   Yes [provider]  cefpodoxime (VANTIN) 200 MG tablet Take 1 tablet (200 mg total) by mouth 2 (two) times daily for 4 doses. Patient not taking: Reported on 09/12/2022 09/11/22 09/13/22  Vernelle Emerald, MD  Nutritional Supplements (ENSURE PLUS HIGH PROTEIN) LIQD Take 237 mLs by mouth 2 (two) times daily.    [provider]    Current Facility-Administered Medications  Medication Dose Route Frequency Provider Last Rate Last Admin   0.9 %  sodium chloride infusion   Intravenous Continuous Fredia Sorrow, MD 100 mL/hr at 09/13/22 0510 New Bag at 09/13/22  0510   acetaminophen (TYLENOL) tablet 650 mg  650 mg Oral Q6H PRN Kayleen Memos, DO   650 mg at 09/12/22 2058   albuterol (PROVENTIL) (2.5 MG/3ML) 0.083% nebulizer solution 2.5 mg  2.5 mg Nebulization Q6H PRN Irene Pap N, DO       cefTRIAXone (ROCEPHIN) 2 g in sodium chloride 0.9 % 100 mL IVPB  2 g Intravenous Q24H Hall, Carole N, DO   Stopped at 09/12/22 2251   cyclobenzaprine (FLEXERIL) tablet 10 mg  10 mg Oral BID PRN Irene Pap N, DO   10 mg at 09/13/22 1023   divalproex (DEPAKOTE ER) 24 hr tablet 500 mg  500  mg Oral Daily Kayleen Memos, DO   500 mg at 09/13/22 1024   enoxaparin (LOVENOX) injection 40 mg  40 mg Subcutaneous Q24H Irene Pap N, DO   40 mg at 09/13/22 1024   guaiFENesin (MUCINEX) 12 hr tablet 600 mg  600 mg Oral BID Irene Pap N, DO   600 mg at 09/13/22 1024   guaiFENesin (ROBITUSSIN) 100 MG/5ML liquid 5 mL  5 mL Oral Q4H PRN Hall, Carole N, DO       ipratropium-albuterol (DUONEB) 0.5-2.5 (3) MG/3ML nebulizer solution 3 mL  3 mL Nebulization Q6H PRN Hosie Poisson, MD       ipratropium-albuterol (DUONEB) 0.5-2.5 (3) MG/3ML nebulizer solution 3 mL  3 mL Nebulization BID Hosie Poisson, MD   3 mL at 09/13/22 0939   levETIRAcetam (KEPPRA) tablet 750 mg  750 mg Oral BID Irene Pap N, DO   750 mg at 09/13/22 1024   lip balm (CARMEX) ointment   Topical PRN Hosie Poisson, MD       metroNIDAZOLE (FLAGYL) IVPB 500 mg  500 mg Intravenous Q12H Irene Pap N, DO 100 mL/hr at 09/13/22 1030 500 mg at 09/13/22 1030   polyethylene glycol (MIRALAX / GLYCOLAX) packet 17 g  17 g Oral Daily PRN Irene Pap N, DO       prochlorperazine (COMPAZINE) injection 5 mg  5 mg Intravenous Q6H PRN Hall, Carole N, DO       sodium chloride HYPERTONIC 3 % nebulizer solution 4 mL  4 mL Nebulization BID Irene Pap N, DO   4 mL at 09/13/22 0932    Allergies as of 09/11/2022 - Review Complete 09/11/2022  Allergen Reaction Noted   Kiwi extract Hives and Itching 11/03/2015   Morphine and related Hives  11/05/2013   Penicillins Hives and Other (See Comments) 02/25/2012   Strawberry extract Hives and Itching 04/25/2015     Review of Systems:    Constitutional: No weight loss, fever or chills Skin: No rash  Cardiovascular: No chest pain Respiratory: +SOB Gastrointestinal: See HPI and otherwise negative Genitourinary: No dysuria  Neurological: No headache, dizziness or syncope Musculoskeletal: No new muscle or joint pain Hematologic: No bleeding  Psychiatric: No history of depression or anxiety    Physical Exam:  Vital signs in last 24 hours: Temp:  [97.4 F (36.3 C)-98.6 F (37 C)] 98.6 F (37 C) (12/18 0537) Pulse Rate:  [70-97] 88 (12/18 0537) Resp:  [20-22] 20 (12/18 0537) BP: (116-141)/(57-69) 117/57 (12/18 0537) SpO2:  [76 %-100 %] 100 % (12/18 0914) FiO2 (%):  [28 %] 28 % (12/18 0537) Weight:  [80.9 kg] 80.9 kg (12/17 1507) Last BM Date : 09/12/22 General:   Pleasant AA female appears to be in NAD, Well developed, Well nourished, alert and cooperative Head:  Normocephalic and atraumatic. Eyes:   PEERL, EOMI. No icterus. Conjunctiva pink. Ears:  Normal auditory acuity. Neck:  Supple Throat: Oral cavity and pharynx without inflammation, swelling or lesion.+edentulous Lungs: Respirations even and unlabored. Lungs clear to auscultation bilaterally.   No wheezes, crackles, or rhonchi.  Heart: Normal S1, S2. No MRG. Regular rate and rhythm. No peripheral edema, cyanosis or pallor.  Abdomen:  Soft, nondistended, +mild RUQ ttp, No rebound or guarding. Normal bowel sounds. No appreciable masses or hepatomegaly. Rectal:  Not performed.  Msk:  Symmetrical without gross deformities. Peripheral pulses intact.  Extremities:  Without edema, no deformity or joint abnormality.  Neurologic:  Alert and  oriented x4;  grossly normal neurologically.  Skin:  Dry and intact without significant lesions or rashes. Psychiatric: Demonstrates good judgement and reason without abnormal affect  or behaviors.   LAB RESULTS: Recent Labs    09/11/22 2255 09/12/22 0515  WBC 13.3* 11.5*  HGB 12.1 10.1*  HCT 38.8 31.9*  PLT 407* 418*   BMET Recent Labs    09/10/22 1113 09/11/22 2255 09/12/22 0600  NA 140 139 139  K 3.9 4.1 3.5  CL 102 102 100  CO2 '30 28 30  '$ GLUCOSE 110* 72 75  BUN '11 9 8  '$ CREATININE 0.52 0.56 0.56  CALCIUM 9.4 8.9 8.5*   LFT Recent Labs    09/11/22 2255  PROT 7.6  ALBUMIN 2.9*  AST 18  ALT 14  ALKPHOS 74  BILITOT 0.2*    STUDIES: CT Angio Chest PE W/Cm &/Or Wo Cm  Result Date: 09/12/2022 CLINICAL DATA:  Shortness of breath and low oxygen saturations. PE suspected. Elevated D-dimer. EXAM: CT ANGIOGRAPHY CHEST WITH CONTRAST TECHNIQUE: Multidetector CT imaging of the chest was performed using the standard protocol during bolus administration of intravenous contrast. Multiplanar CT image reconstructions and MIPs were obtained to evaluate the vascular anatomy. RADIATION DOSE REDUCTION: This exam was performed according to the departmental dose-optimization program which includes automated exposure control, adjustment of the mA and/or kV according to patient size and/or use of iterative reconstruction technique. CONTRAST:  84m OMNIPAQUE IOHEXOL 350 MG/ML SOLN COMPARISON:  Radiographs 09/11/2022 and CT chest 09/07/2022 FINDINGS: Cardiovascular: Satisfactory opacification of the pulmonary arteries to the segmental level. No evidence of pulmonary embolism. Normal heart size. No pericardial effusion. Aortic calcification Mediastinum/Nodes: No thoracic adenopathy by size. Thyroid gland, trachea, and esophagus demonstrate no significant findings. Lungs/Pleura: Paraseptal emphysema in the lung apices. Peribronchial wall thickening and mucous plugging greatest in the bilateral lower lobes. Peribronchovascular atelectasis/infiltrates in the lower lobes. These findings have increased since 09/07/2022. No pleural effusion or pneumothorax. Upper Abdomen: No acute  abnormality. Musculoskeletal: No chest wall abnormality. No acute osseous findings. Exaggerated kyphosis. Review of the MIP images confirms the above findings. IMPRESSION: Negative for acute pulmonary embolism. Dependent airspace opacities in the lower lobes with bronchial wall thickening and mucous plugging. Findings may represent pneumonia and/or aspiration. Aortic Atherosclerosis (ICD10-I70.0) and Emphysema (ICD10-J43.9). Electronically Signed   By: TPlacido SouM.D.   On: 09/12/2022 01:53   DG Chest Port 1 View  Result Date: 09/11/2022 CLINICAL DATA:  Shortness of breath and history of pneumonia EXAM: PORTABLE CHEST 1 VIEW COMPARISON:  Radiographs 09/09/2022 FINDINGS: Suboptimal exam secondary to patient kyphosis. The left mid and upper lung are obscured. Bibasilar atelectasis or infiltrates greater on the left. Question small left pleural effusion. Findings are similar to 09/09/2022. IMPRESSION: Basilar opacities may be due to atelectasis or pneumonia. Question left pleural effusion. Electronically Signed   By: TPlacido SouM.D.   On: 09/11/2022 22:36     Impression / Plan:   Impression: 1.  Dysphagia: For the past month per the patient, longer symptoms of reflux which is untreated, also edentulous which is likely contributing; consider peptic stricture versus dysmotility versus other 2.  Acute hypoxic respiratory failure secondary to pneumonia: Not on O2 supplementation at baseline but currently on 2 L to maintain O2 saturation above 92% 3.  Recent streptococcal pneumonia: Currently on Rocephin, IV Flagyl added due to concern for aspiration 4.  Seizure disorder 5.  GERD: Patient reports she does not take medication for this at home, has been going on for years off-and-on 6.  Anemia of chronic  disease: Hemoglobin stable around 10  Plan: 1.  It appears her respiratory status is slowly improving, no longer needing continuous oxygen supplementation. 2.  Start her workup with a barium  esophagram with tablet for further evaluation of her dysphagia.  Pending this may need an EGD. 3.  For now patient can stay on clear/full liquid diet 4.  Started the patient on Pantoprazole 40 mg IV twice a day 5.  Treatment for her pneumonia per hospitalist team 6.  Will need to hold Lovenox if EGD is pursued  Thank you for your kind consultation, we will continue to follow.  Lavone Nian [facility]  09/13/2022, 11:09 AM     Attending Physician Note   I have taken a history, reviewed the chart and examined the patient. I performed a substantive portion of this encounter, including complete performance of at least one of the key components, in conjunction with the APP. I agree with the APP's note, impression and recommendations with my edits. My additional impressions and recommendations are as follows.   *Dysphagia, GERD. R/O stricture, esophageal dysmotility, esophagitis. Start pantoprazole 40 mg IV bid. Schedule barium esophagram. Pending esophagram results may need EGD.   *Acute hypoxia respiratory failure secondary to pneumonia  *Anemia of chronic disease  Lucio Edward, MD [facility] AM[facility], [facility], for our on call provider

## 2022-09-13 NOTE — Plan of Care (Signed)
  Problem: Education: Goal: Knowledge of General Education information will improve Description Including pain rating scale, medication(s)/side effects and non-pharmacologic comfort measures Outcome: Progressing   Problem: Health Behavior/Discharge Planning: Goal: Ability to manage health-related needs will improve Outcome: Progressing   

## 2022-09-13 NOTE — Progress Notes (Signed)
[facility]                                                                               [name], is a 66 y.o. female, DOB - 22-Sep-1956, CBJ:[id number] Admit date - 09/11/2022    Outpatient Primary MD for the patient is Lindsey Market, MD  LOS - 1  days    Brief summary   Lindsey [name] is a 66 y.o. female with medical history significant for hypertension, seizure disorder, GERD, just discharged from the hospital yesterday, treated for pneumonia, who presented with complaints of worsening shortness of breath and a nonproductive cough.  States her medications were not delivered to her home.  EMS was activated.  Upon EMS arrival she was noted to be hypoxic with O2 saturation in the 70s and was placed on nonrebreather.   In the ED, workup revealed no CT evidence of any embolism, dependent airspace opacities in the lower lobes with bronchial wall thickening and mucous plugging.  Findings may represent pneumonia and/or aspiration.  The patient was started empirically on Rocephin and IV Flagyl, allergic to penicillin.  Admitted by [facility], hospitalist service.       Assessment & Plan    Assessment and Plan:  Basilar pneumonia: She was recently treated for RLL pneumonia and discharged on 09/10/22.  CT angio  chest shows dependent airspace opacities in the lower lobes with bronchial wall thickening and mucous plugging. Findings may represent pneumonia and/or aspiration. Continue with IV antibiotics for another 24 hours, .  Continue with IS and physical therapy.  Weaned her off oxygen today. Improving leukocytosis.  SLP eval recommending regular diet. Patient reports dysphagia to solids.  Will get GI eval to see if she needs EGD.     Hypertension:  Well controlled.    GERD; Stable.    Seizure disorder:  Continue with keppra and depakote.   Anemia of chronic disease:  Hemoglobin stable around 10.     [facility]     Estimated body mass index is 27.12 kg/m as calculated from the following:   Height as of this encounter: '5\' 8"'$  (1.727 m).   Weight as of this encounter: 80.9 kg.  Code Status: full code.  DVT Prophylaxis:  enoxaparin (LOVENOX) injection 40 mg Start: 09/12/22 1000   Level of Care: Level of care: Progressive Family Communication: none at bedside.   Disposition Plan:     Remains inpatient appropriate:  IV antibiotics.   Procedures:  None.   Consultants:   Gastroenterology   Antimicrobials:   Anti-infectives (From admission, onward)    Start     Dose/Rate Route Frequency Ordered Stop   09/12/22 2200  cefTRIAXone (ROCEPHIN) 2 g in sodium chloride 0.9 % 100 mL IVPB        2 g 200 mL/hr over 30 Minutes Intravenous Every 24 hours 09/12/22 0513     09/12/22 1000  metroNIDAZOLE (FLAGYL) IVPB 500 mg        500 mg 100 mL/hr over 60 Minutes Intravenous Every 12 hours 09/12/22 0513     09/12/22 0230  cefTRIAXone (ROCEPHIN) 1 g in sodium chloride 0.9 % 100 mL  IVPB        1 g 200 mL/hr over 30 Minutes Intravenous  Once 09/12/22 0229 09/12/22 0317   09/12/22 0230  metroNIDAZOLE (FLAGYL) IVPB 500 mg        500 mg 100 mL/hr over 60 Minutes Intravenous  Once 09/12/22 0229 09/12/22 0538        Medications  Scheduled Meds:  divalproex  500 mg Oral Daily   enoxaparin (LOVENOX) injection  40 mg Subcutaneous Q24H   guaiFENesin  600 mg Oral BID   ipratropium-albuterol  3 mL Nebulization BID   levETIRAcetam  750 mg Oral BID   sodium chloride HYPERTONIC  4 mL Nebulization BID   Continuous Infusions:  sodium chloride 100 mL/hr at 09/13/22 0510   cefTRIAXone (ROCEPHIN)  IV Stopped (09/12/22 2251)   metronidazole 500 mg (09/13/22 1030)   PRN Meds:.acetaminophen, albuterol, cyclobenzaprine, guaiFENesin, ipratropium-albuterol, lip balm, polyethylene glycol, prochlorperazine    Subjective:   Lindsey [name] was seen and examined today.  Reports feeling dysphagia to solids.  Can  take liquids without any issues.  No chest pain or sob. No nausea, vomiting .   Objective:   Vitals:   09/12/22 2030 09/12/22 2321 09/13/22 0537 09/13/22 0914  BP:  (!) 116/58 (!) 117/57   Pulse:  97 88   Resp:  20 20   Temp:  98.5 F (36.9 C) 98.6 F (37 C)   TempSrc:  Oral Oral   SpO2: 94% 99% 99% 100%  Weight:      Height:        Intake/Output Summary (Last 24 hours) at 09/13/2022 1044 Last data filed at 09/13/2022 0900 Gross per 24 hour  Intake 2173.81 ml  Output 170 ml  Net 2003.81 ml   Filed Weights   09/12/22 1507  Weight: 80.9 kg     Exam General exam: Appears calm and comfortable  Respiratory system: Clear to auscultation. Respiratory effort normal. Cardiovascular system: S1 & S2 heard, RRR. No JVD,  Gastrointestinal system: Abdomen is nondistended, soft and nontender.  Central nervous system: Alert and oriented. No focal neurological deficits. Extremities: Symmetric 5 x 5 power. Skin: No rashes, lesions or ulcers Psychiatry:  Mood & affect appropriate.    Data Reviewed:  I have personally reviewed following labs and imaging studies   CBC Lab Results  Component Value Date   WBC 11.5 (H) 09/12/2022   RBC 3.48 (L) 09/12/2022   HGB 10.1 (L) 09/12/2022   HCT 31.9 (L) 09/12/2022   MCV 91.7 09/12/2022   MCH 29.0 09/12/2022   PLT 418 (H) 09/12/2022   MCHC 31.7 09/12/2022   RDW 14.6 09/12/2022   LYMPHSABS 2.3 09/11/2022   MONOABS 1.6 (H) 09/11/2022   EOSABS 0.2 09/11/2022   BASOSABS 0.0 19/41/7408     Last metabolic panel Lab Results  Component Value Date   NA 139 09/12/2022   K 3.5 09/12/2022   CL 100 09/12/2022   CO2 30 09/12/2022   BUN 8 09/12/2022   CREATININE 0.56 09/12/2022   GLUCOSE 75 09/12/2022   GFRNONAA >60 09/12/2022   GFRAA >60 06/29/2020   CALCIUM 8.5 (L) 09/12/2022   PHOS 3.9 09/12/2022   PROT 7.6 09/11/2022   ALBUMIN 2.9 (L) 09/11/2022   BILITOT 0.2 (L) 09/11/2022   ALKPHOS 74 09/11/2022   AST 18 09/11/2022   ALT 14  09/11/2022   ANIONGAP 9 09/12/2022    CBG (last 3)  No results for input(s): "GLUCAP" in the last 72 hours.    Coagulation  Profile: No results for input(s): "INR", "PROTIME" in the last 168 hours.   Radiology Studies: CT Angio Chest PE W/Cm &/Or Wo Cm  Result Date: 09/12/2022 CLINICAL DATA:  Shortness of breath and low oxygen saturations. PE suspected. Elevated D-dimer. EXAM: CT ANGIOGRAPHY CHEST WITH CONTRAST TECHNIQUE: Multidetector CT imaging of the chest was performed using the standard protocol during bolus administration of intravenous contrast. Multiplanar CT image reconstructions and MIPs were obtained to evaluate the vascular anatomy. RADIATION DOSE REDUCTION: This exam was performed according to the departmental dose-optimization program which includes automated exposure control, adjustment of the mA and/or kV according to patient size and/or use of iterative reconstruction technique. CONTRAST:  36m OMNIPAQUE IOHEXOL 350 MG/ML SOLN COMPARISON:  Radiographs 09/11/2022 and CT chest 09/07/2022 FINDINGS: Cardiovascular: Satisfactory opacification of the pulmonary arteries to the segmental level. No evidence of pulmonary embolism. Normal heart size. No pericardial effusion. Aortic calcification Mediastinum/Nodes: No thoracic adenopathy by size. Thyroid gland, trachea, and esophagus demonstrate no significant findings. Lungs/Pleura: Paraseptal emphysema in the lung apices. Peribronchial wall thickening and mucous plugging greatest in the bilateral lower lobes. Peribronchovascular atelectasis/infiltrates in the lower lobes. These findings have increased since 09/07/2022. No pleural effusion or pneumothorax. Upper Abdomen: No acute abnormality. Musculoskeletal: No chest wall abnormality. No acute osseous findings. Exaggerated kyphosis. Review of the MIP images confirms the above findings. IMPRESSION: Negative for acute pulmonary embolism. Dependent airspace opacities in the lower lobes with  bronchial wall thickening and mucous plugging. Findings may represent pneumonia and/or aspiration. Aortic Atherosclerosis (ICD10-I70.0) and Emphysema (ICD10-J43.9). Electronically Signed   By: TPlacido SouM.D.   On: 09/12/2022 01:53   DG Chest Port 1 View  Result Date: 09/11/2022 CLINICAL DATA:  Shortness of breath and history of pneumonia EXAM: PORTABLE CHEST 1 VIEW COMPARISON:  Radiographs 09/09/2022 FINDINGS: Suboptimal exam secondary to patient kyphosis. The left mid and upper lung are obscured. Bibasilar atelectasis or infiltrates greater on the left. Question small left pleural effusion. Findings are similar to 09/09/2022. IMPRESSION: Basilar opacities may be due to atelectasis or pneumonia. Question left pleural effusion. Electronically Signed   By: TPlacido SouM.D.   On: 09/11/2022 22:36       VHosie PoissonM.D. [facility] 09/13/2022, 10:44 AM  Available via Epic secure chat 7am-7pm After 7 pm, please refer to night coverage provider listed on amion.

## 2022-09-14 ENCOUNTER — Encounter (HOSPITAL_COMMUNITY): Payer: Self-pay | Admitting: Internal Medicine

## 2022-09-14 ENCOUNTER — Encounter (HOSPITAL_COMMUNITY)
Admission: EM | Disposition: A | Discharge status: Home / Self Care | Payer: Self-pay | Source: Home / Self Care | Attending: Internal Medicine

## 2022-09-14 ENCOUNTER — Inpatient Hospital Stay (HOSPITAL_COMMUNITY): Payer: Medicare (Managed Care) | Admitting: Certified Registered"

## 2022-09-14 DIAGNOSIS — R1319 Other dysphagia: Secondary | ICD-10-CM

## 2022-09-14 DIAGNOSIS — R1310 Dysphagia, unspecified: Secondary | ICD-10-CM | POA: Diagnosis not present

## 2022-09-14 DIAGNOSIS — K2100 Gastro-esophageal reflux disease with esophagitis, without bleeding: Secondary | ICD-10-CM | POA: Diagnosis not present

## 2022-09-14 DIAGNOSIS — K219 Gastro-esophageal reflux disease without esophagitis: Secondary | ICD-10-CM | POA: Diagnosis not present

## 2022-09-14 DIAGNOSIS — K222 Esophageal obstruction: Secondary | ICD-10-CM

## 2022-09-14 DIAGNOSIS — K449 Diaphragmatic hernia without obstruction or gangrene: Secondary | ICD-10-CM

## 2022-09-14 DIAGNOSIS — I10 Essential (primary) hypertension: Secondary | ICD-10-CM

## 2022-09-14 DIAGNOSIS — Z87891 Personal history of nicotine dependence: Secondary | ICD-10-CM

## 2022-09-14 DIAGNOSIS — R569 Unspecified convulsions: Secondary | ICD-10-CM | POA: Diagnosis not present

## 2022-09-14 DIAGNOSIS — R0902 Hypoxemia: Secondary | ICD-10-CM | POA: Diagnosis not present

## 2022-09-14 DIAGNOSIS — R933 Abnormal findings on diagnostic imaging of other parts of digestive tract: Secondary | ICD-10-CM

## 2022-09-14 DIAGNOSIS — J449 Chronic obstructive pulmonary disease, unspecified: Secondary | ICD-10-CM

## 2022-09-14 HISTORY — PX: SAVORY DILATION: SHX5439

## 2022-09-14 HISTORY — PX: ESOPHAGOGASTRODUODENOSCOPY (EGD) WITH PROPOFOL: SHX5813

## 2022-09-14 HISTORY — PX: BIOPSY: SHX5522

## 2022-09-14 SURGERY — ESOPHAGOGASTRODUODENOSCOPY (EGD) WITH PROPOFOL
Anesthesia: Monitor Anesthesia Care

## 2022-09-14 MED ORDER — LACTATED RINGERS IV SOLN
INTRAVENOUS | Status: AC | PRN
  Administered 2022-09-14: 1000 mL via INTRAVENOUS

## 2022-09-14 MED ORDER — PROPOFOL 10 MG/ML IV BOLUS
INTRAVENOUS | Status: AC
  Filled 2022-09-14: qty 20

## 2022-09-14 MED ORDER — PROPOFOL 10 MG/ML IV BOLUS
INTRAVENOUS | Status: DC | PRN
  Administered 2022-09-14: 50 mg via INTRAVENOUS
  Administered 2022-09-14 (×2): 30 mg via INTRAVENOUS
  Administered 2022-09-14: 20 mg via INTRAVENOUS

## 2022-09-14 MED ORDER — LIDOCAINE 2% (20 MG/ML) 5 ML SYRINGE
INTRAMUSCULAR | Status: DC | PRN
  Administered 2022-09-14: 60 mg via INTRAVENOUS

## 2022-09-14 SURGICAL SUPPLY — 15 items

## 2022-09-14 NOTE — Evaluation (Signed)
Occupational Therapy Evaluation Patient Details Name: Lindsey [name] MRN: [id number] DOB: 07/25/1956 Today's Date: 09/14/2022   History of Present Illness Patient was admitted to the hospital with shortness of breath and cough, with a recent diagnosis of PNA. PMH: HTN, seizure disorder, arthritis   Clinical Impression   Patient presented with slight deconditioning, mild generalized weakness, and feelings of shortness of breath with progressive activity. She stood with min guard assist using a RW, required min assist to donn her underwear, and min guard assist to stand-pivot to the [facility] then bedside chair. She will benefit from further OT services to maximize her independence with ADLs & to decrease the risk for restricted participation in meaningful activities.      Recommendations for follow up therapy are one component of a multi-disciplinary discharge planning process, led by the attending physician.  Recommendations may be updated based on patient status, additional functional criteria and insurance authorization.   Follow Up Recommendations  Home health OT     Assistance Recommended at Discharge Intermittent Supervision/Assistance  Patient can return home with the following A little help with walking and/or transfers;A little help with bathing/dressing/bathroom;Direct supervision/assist for financial management;Direct supervision/assist for medications management;Assistance with cooking/housework    Functional Status Assessment  Patient has had a recent decline in their functional status and demonstrates the ability to make significant improvements in function in a reasonable and predictable amount of time.  Equipment Recommendations  BSC/3in1       Precautions / Restrictions Precautions Precautions: Fall Restrictions Weight Bearing Restrictions: No      Mobility Bed Mobility Overal bed mobility: Needs Assistance Bed Mobility: Supine to Sit     Supine to sit: HOB  elevated, Min guard          Transfers Overall transfer level: Needs assistance Equipment used: Rolling walker (2 wheels) Transfers: Sit to/from Stand, Bed to chair/wheelchair/BSC Sit to Stand: Min guard          Balance     Sitting balance-Leahy Scale: Good       Standing balance-Leahy Scale:  (min guard with RW)         ADL either performed or assessed with clinical judgement   ADL Overall ADL's : Needs assistance/impaired Eating/Feeding: Independent;Sitting Eating/Feeding Details (indicate cue type and reason): at chair level Grooming: Set up;Sitting Grooming Details (indicate cue type and reason): She performed hand washing in sitting at chair level.         Upper Body Dressing : Set up;Sitting   Lower Body Dressing: Minimal assistance;Sit to/from stand Lower Body Dressing Details (indicate cue type and reason): She required min assist to pull her underwear up over her hips in standing, using a RW for support in standing Toilet Transfer: Min guard;Rolling walker (2 wheels);BSC/3in1;Stand-pivot                    Pertinent Vitals/Pain Pain Assessment Pain Assessment: 0-10 Pain Score: 10-Worst pain ever Pain Location: generalized Pain Intervention(s): Limited activity within patient's tolerance; Nurse informed      Hand Dominance Right      Communication Communication Communication: No difficulties   Cognition Arousal/Alertness: Awake/alert Behavior During Therapy: WFL for tasks assessed/performed                   Home Living Family/patient expects to be discharged to:: She reports living in a motel recently Living Arrangements: Children/daughter      Home Access: Level entry      Home Equipment: Kasandra Knudsen -  single point;Wheelchair - manual   Additional Comments: reports she [facility] to [facility] 5 [facility] a [facility]      Prior Functioning/Environment Prior Level of Function : Independent/Modified Independent        Mobility Comments:  (Pt  reports she ambulates with a cane while in [facility] therapy, otherwise she uses a manual wheelchair for mobility & is able to stand-pivot into & out of the wheelchair) ADLs Comments:  (Pt reports generally MOD I with ADLs except occasional assist for bathing (sink level); assist for IADL)        OT Problem List: Decreased strength;Impaired balance (sitting and/or standing);Decreased activity tolerance;Pain      OT Treatment/Interventions: Self-care/ADL training;Therapeutic activities;Therapeutic exercise;Energy conservation;Patient/family education;DME and/or AE instruction;Balance training    OT Goals(Current goals can be found in the care plan section) Acute Rehab OT Goals Patient Stated Goal: to get better and discharge soon OT Goal Formulation: With patient Time For Goal Achievement: 09/28/22 Potential to Achieve Goals: Good  OT Frequency: Min 2X/week       AM-PAC OT "6 Clicks" Daily Activity     Outcome Measure Help from another person eating meals?: None Help from another person taking care of personal grooming?: None Help from another person toileting, which includes using toliet, bedpan, or urinal?: A Little Help from another person bathing (including washing, rinsing, drying)?: A Little Help from another person to put on and taking off regular upper body clothing?: A Little Help from another person to put on and taking off regular lower body clothing?: A Little 6 Click Score: 20   End of Session Equipment Utilized During Treatment: Rolling walker (2 wheels) Nurse Communication: Mobility status  Activity Tolerance: Patient tolerated treatment well Patient left: in chair;with call bell/phone within reach  OT Visit Diagnosis: Muscle weakness (generalized) (M62.81)                Time: 6294-7654 OT Time Calculation (min): 32 min Charges:  OT General Charges $OT Visit: 1 Visit OT Evaluation $OT Eval Low Complexity: 1 Low OT Treatments $Self Care/Home Management : 8-22  mins    Leota Sauers, OTR/L 09/14/2022, 5:26 PM

## 2022-09-14 NOTE — TOC Progression Note (Signed)
Transition of Care [facility]) - Progression Note    Patient Details  Name: Shantea Poulton MRN: [id number] Date of Birth: 04-23-1956  [facility] CM/SW Contact  Leeroy Cha, RN Phone Number: 09/14/2022, 10:21 AM  Clinical Narrative:     Update given to Ms. Glover at [facility].  Expected Discharge Plan: Home/Self Care Barriers to Discharge: Continued Medical Work up  Expected Discharge Plan and Services Expected Discharge Plan: Home/Self Care   Discharge Planning Services: CM Consult Post Acute Care Choice: Resumption of Svcs/PTA Provider Living arrangements for the past 2 months: Single Family Home                                       Social Determinants of Health (SDOH) Interventions    Readmission Risk Interventions   Row Labels 09/09/2022   12:02 PM 09/08/2022    1:22 PM 09/12/2020    1:31 PM  Readmission Risk Prevention Plan   Section Header. No data exists in this row.     Transportation Screening   Complete Complete Complete  PCP or Specialist Appt within 5-7 Days   Complete Complete Complete  Home Care Screening   Complete Complete Complete  Medication Review (RN CM)   Complete Complete Referral to Pharmacy

## 2022-09-14 NOTE — Transfer of Care (Signed)
Immediate Anesthesia Transfer of Care Note  Patient: Lindsey [name]  Procedure(s) Performed: ESOPHAGOGASTRODUODENOSCOPY (EGD) WITH PROPOFOL BIOPSY SAVORY DILATION  Patient Location: PACU  Anesthesia Type:MAC  Level of Consciousness: awake, alert , and oriented  Airway & Oxygen Therapy: Patient Spontanous Breathing and Patient connected to face mask oxygen  Post-op Assessment: Report given to RN and Post -op Vital signs reviewed and stable  Post vital signs: Reviewed and stable  Last Vitals:  Vitals Value Taken Time  BP    Temp    Pulse    Resp    SpO2      Last Pain:  Vitals:   09/14/22 1152  TempSrc: Tympanic  PainSc: 0-No pain      Patients Stated Pain Goal: 2 (65/53/74 8270)  Complications: No notable events documented.

## 2022-09-14 NOTE — Anesthesia Preprocedure Evaluation (Addendum)
Anesthesia Evaluation  Patient identified by MRN, date of birth, ID band Patient awake    Reviewed: Allergy & Precautions, NPO status , Patient's Chart, lab work & pertinent test results  History of Anesthesia Complications Negative for: history of anesthetic complications  Airway Mallampati: I  TM Distance: >3 FB Neck ROM: Full    Dental  (+) Edentulous Upper, Edentulous Lower   Pulmonary asthma , COPD, former smoker   Pulmonary exam normal        Cardiovascular hypertension, Normal cardiovascular exam     Neuro/Psych Seizures -,    Depression       GI/Hepatic ,GERD  ,,(+)     substance abuse  alcohol use  Endo/Other  negative endocrine ROS    Renal/GU negative Renal ROS  negative genitourinary   Musculoskeletal  (+) Arthritis ,    Abdominal   Peds  Hematology  (+) Blood dyscrasia, anemia   Anesthesia Other Findings Recently admitted for RLL pneumonia, discharged 09/10/22, readmitted 12/16 with hypoxia. Now weaned off O2. Concern for esophageal stricture.  Reproductive/Obstetrics                             Anesthesia Physical Anesthesia Plan  ASA: 3  Anesthesia Plan: MAC   Post-op Pain Management: Minimal or no pain anticipated   Induction: Intravenous  PONV Risk Score and Plan: 2 and Propofol infusion, TIVA and Treatment may vary due to age or medical condition  Airway Management Planned: Natural Airway, Nasal Cannula and Simple Face Mask  Additional Equipment: None  Intra-op Plan:   Post-operative Plan:   Informed Consent: I have reviewed the patients History and Physical, chart, labs and discussed the procedure including the risks, benefits and alternatives for the proposed anesthesia with the patient or authorized representative who has indicated his/her understanding and acceptance.       Plan Discussed with:   Anesthesia Plan Comments:        Anesthesia  Quick Evaluation

## 2022-09-14 NOTE — Op Note (Addendum)
[facility] Patient Name: Adanya Sosinski Procedure Date: 09/14/2022 MRN: [id number] Attending MD: Ladene Artist , MD, [id number] Date of Birth: 1955-10-14 CSN: [id number] Age: 66 Admit Type: Inpatient Procedure:                Upper GI endoscopy Indications:              Dysphagia, Abnormal esophagram Providers:                Pricilla Riffle. Fuller Plan, MD, Elmer Ramp. Hinson, RN, [employer], Technician, [employer], CRNA Referring MD:             [facility] Medicines:                Monitored Anesthesia Care Complications:            No immediate complications. Estimated Blood Loss:     Estimated blood loss was minimal. Procedure:                Pre-Anesthesia Assessment:                           - Prior to the procedure, a History and Physical                            was performed, and patient medications and                            allergies were reviewed. The patient's tolerance of                            previous anesthesia was also reviewed. The risks                            and benefits of the procedure and the sedation                            options and risks were discussed with the patient.                            All questions were answered, and informed consent                            was obtained. Prior Anticoagulants: The patient has                            taken Lovenox (enoxaparin), last dose was 1 day                            prior to procedure. ASA Grade Assessment: III - A                            patient with severe systemic disease. After  reviewing the risks and benefits, the patient was                            deemed in satisfactory condition to undergo the                            procedure.                           After obtaining informed consent, the endoscope was                            passed under direct vision. Throughout the                             procedure, the patient's blood pressure, pulse, and                            oxygen saturations were monitored continuously. The                            [id number]-H190 ([id number]) Olympus endoscope was introduced                            through the mouth, and advanced to the second part                            of duodenum. The upper GI endoscopy was                            accomplished without difficulty. The patient                            tolerated the procedure well. Scope In: Scope Out: Findings:      LA Grade B (one or more mucosal breaks greater than 5 mm, not extending       between the tops of two mucosal folds) esophagitis with no bleeding was       found in the distal esophagus. Biopsies were taken with a cold forceps       for histology.      One benign-appearing, intrinsic mild stenosis was found at the       gastroesophageal junction. This stenosis measured 1.4 cm (inner       diameter) x less than one cm (in length). The stenosis was traversed. A       guidewire was placed and the scope was withdrawn. Dilation was performed       with a Savary dilator with mild resistance at 16 mm.      The exam of the esophagus was otherwise normal.      A small hiatal hernia was present.      The exam of the stomach was otherwise normal.      The duodenal bulb and second portion of the duodenum were normal. Impression:               - LA Grade B reflux esophagitis with no bleeding.  Biopsied.                           - Benign-appearing esophageal stenosis. Dilated.                           - Small hiatal hernia.                           - Normal duodenal bulb and second portion of the                            duodenum. Moderate Sedation:      Not Applicable - Patient had care per Anesthesia. Recommendation:           - Return patient to hospital ward for ongoing care.                           - Clear liquid diet for 2 hours, then advance as                             tolerated to soft diet today.                           - Follow antireflux measures long term.                           - Pantoprazole 40 mg bid in hospital then 40 mg po                            qd long term at discharge.                           - Continue present medications.                           - OK to resume Lovenox tomorrow morning as                            indicated per primary service.                           - Await pathology results.                           - Outpatient GI follow up with Dr. Ardis Hughs prn.                           - GI signing off. Procedure Code(s):        --- Professional ---                           [phone], Esophagogastroduodenoscopy, flexible,                            transoral; with insertion of guide wire followed by  passage of dilator(s) through esophagus over guide                            wire                           43239, 59, Esophagogastroduodenoscopy, flexible,                            transoral; with biopsy, single or multiple Diagnosis Code(s):        --- Professional ---                           K21.00, Gastro-esophageal reflux disease with                            esophagitis, without bleeding                           K22.2, Esophageal obstruction                           K44.9, Diaphragmatic hernia without obstruction or                            gangrene                           R13.10, Dysphagia, unspecified                           R93.3, Abnormal findings on diagnostic imaging of                            other parts of digestive tract CPT copyright 2022 [employer]. All rights reserved. The codes documented in this report are preliminary and upon coder review may  be revised to meet current compliance requirements. Ladene Artist, MD 09/14/2022 1:09:39 PM This report has been signed electronically. Number of Addenda: 0

## 2022-09-14 NOTE — Interval H&P Note (Signed)
History and Physical Interval Note:  09/14/2022 12:33 PM  Lindsey [name]  has presented today for surgery, with the diagnosis of Dysphagia.  The various methods of treatment have been discussed with the patient and family. After consideration of risks, benefits and other options for treatment, the patient has consented to  Procedure(s): ESOPHAGOGASTRODUODENOSCOPY (EGD) WITH PROPOFOL (N/A) as a surgical intervention.  The patient's history has been reviewed, patient examined, no change in status, stable for surgery.  I have reviewed the patient's chart and labs.  Questions were answered to the patient's satisfaction.     Pricilla Riffle. Fuller Plan

## 2022-09-14 NOTE — Progress Notes (Signed)
**Note Lindsey via Obfuscation** [facility]                                                                               [place], is a 66 y.o. female, DOB - Dec 07, 1955, DJS:[id number] Admit date - 09/11/2022    Outpatient Primary MD for the patient is [name], MD  LOS - 2  days    Brief summary   Lindsey [name] is a 66 y.o. female with medical history significant for hypertension, seizure disorder, GERD, just discharged from the hospital yesterday, treated for pneumonia, who presented with complaints of worsening shortness of breath and a nonproductive cough.  States her medications were not delivered to her home.  EMS was activated.  Upon EMS arrival she was noted to be hypoxic with O2 saturation in the 70s and was placed on nonrebreather.   In the ED, workup revealed no CT evidence of any embolism, dependent airspace opacities in the lower lobes with bronchial wall thickening and mucous plugging.  Findings may represent pneumonia and/or aspiration.  The patient was started empirically on Rocephin and IV Flagyl, allergic to penicillin.  Admitted by [facility], hospitalist service.       Assessment & Plan    Assessment and Plan:  Basilar pneumonia: She was recently treated for RLL pneumonia and discharged on 09/10/22.  CT angio  chest shows dependent airspace opacities in the lower lobes with bronchial wall thickening and mucous plugging. Findings may represent pneumonia and/or aspiration. Continue with IV antibiotics for another 24 hours, .  Continue with IS and physical therapy.  Weaned her off oxygen today. Improving leukocytosis.  SLP eval recommending regular diet. Patient reports dysphagia to solids.  Esophagogram showed possible esophageal stricture distally. GI on board and plan for EGD.     Hypertension:  OPtimal BP parameters.    GERD; Stable.    Seizure disorder:  Continue with keppra and depakote.   Anemia of chronic disease:  Hemoglobin stable around 10.    Dysphagia:  GI consulted and plan for EGD.   [facility]    Estimated body mass index is 27.12 kg/m as calculated from the following:   Height as of this encounter: '5\' 8"'$  (1.727 m).   Weight as of this encounter: 80.9 kg.  Code Status: full code.  DVT Prophylaxis:  enoxaparin (LOVENOX) injection 40 mg Start: 09/14/22 2200   Level of Care: Level of care: Progressive Family Communication: none at bedside.   Disposition Plan:     Remains inpatient appropriate:  IV antibiotics.   Procedures:  None.   Consultants:   Gastroenterology   Antimicrobials:   Anti-infectives (From admission, onward)    Start     Dose/Rate Route Frequency Ordered Stop   09/12/22 2200  cefTRIAXone (ROCEPHIN) 2 g in sodium chloride 0.9 % 100 mL IVPB        2 g 200 mL/hr over 30 Minutes Intravenous Every 24 hours 09/12/22 0513     09/12/22 1000  metroNIDAZOLE (FLAGYL) IVPB 500 mg        500 mg 100 mL/hr over 60 Minutes Intravenous Every 12 hours 09/12/22 0513     09/12/22 0230  cefTRIAXone (ROCEPHIN) 1 g in sodium chloride 0.9 % 100 mL IVPB        1 g 200 mL/hr over 30 Minutes Intravenous  Once 09/12/22 0229 09/12/22 0317   09/12/22 0230  metroNIDAZOLE (FLAGYL) IVPB 500 mg        500 mg 100 mL/hr over 60 Minutes Intravenous  Once 09/12/22 0229 09/12/22 0538        Medications  Scheduled Meds:  divalproex  500 mg Oral BID   enoxaparin (LOVENOX) injection  40 mg Subcutaneous Q24H   guaiFENesin  600 mg Oral BID   ipratropium-albuterol  3 mL Nebulization BID   levETIRAcetam  750 mg Oral BID   pantoprazole (PROTONIX) IV  40 mg Intravenous Q12H   sodium chloride HYPERTONIC  4 mL Nebulization BID   Continuous Infusions:  sodium chloride 100 mL/hr at 09/14/22 7062   cefTRIAXone (ROCEPHIN)  IV Stopped (09/13/22 2239)   metronidazole 500 mg (09/14/22 0821)   PRN Meds:.acetaminophen, albuterol, cyclobenzaprine, guaiFENesin, ipratropium-albuterol, lip balm,  oxyCODONE-acetaminophen, polyethylene glycol, prochlorperazine    Subjective:   [name] was seen and examined today.  NO CHEST PAIN or sob. No nausea, vomiting.   Objective:   Vitals:   09/13/22 0914 09/13/22 2031 09/14/22 0505 09/14/22 0858  BP:  130/75 137/66   Pulse:  97 96   Resp:  19 18   Temp:  98.2 F (36.8 C) 98.7 F (37.1 C)   TempSrc:  Oral Oral   SpO2: 100% 98% 94% 94%  Weight:      Height:        Intake/Output Summary (Last 24 hours) at 09/14/2022 1139 Last data filed at 09/14/2022 0614 Gross per 24 hour  Intake 2874.19 ml  Output 1601 ml  Net 1273.19 ml    Filed Weights   09/12/22 1507  Weight: 80.9 kg     Exam General exam: Appears calm and comfortable  Respiratory system: Clear to auscultation. Respiratory effort normal. Cardiovascular system: S1 & S2 heard, RRR. No JVD,  Gastrointestinal system: Abdomen is nondistended, soft and nontender.  Central nervous system: Alert and oriented. No focal neurological deficits. Extremities: Symmetric 5 x 5 power. Skin: No rashes, lesions or ulcers Psychiatry:Mood & affect appropriate.     Data Reviewed:  I have personally reviewed following labs and imaging studies   CBC Lab Results  Component Value Date   WBC 11.5 (H) 09/12/2022   RBC 3.48 (L) 09/12/2022   HGB 10.1 (L) 09/12/2022   HCT 31.9 (L) 09/12/2022   MCV 91.7 09/12/2022   MCH 29.0 09/12/2022   PLT 418 (H) 09/12/2022   MCHC 31.7 09/12/2022   RDW 14.6 09/12/2022   LYMPHSABS 2.3 09/11/2022   MONOABS 1.6 (H) 09/11/2022   EOSABS 0.2 09/11/2022   BASOSABS 0.0 37/62/8315     Last metabolic panel Lab Results  Component Value Date   NA 139 09/12/2022   K 3.5 09/12/2022   CL 100 09/12/2022   CO2 30 09/12/2022   BUN 8 09/12/2022   CREATININE 0.56 09/12/2022   GLUCOSE 75 09/12/2022   GFRNONAA >60 09/12/2022   GFRAA >60 06/29/2020   CALCIUM 8.5 (L) 09/12/2022   PHOS 3.9 09/12/2022   PROT 7.6 09/11/2022   ALBUMIN 2.9 (L)  09/11/2022   BILITOT 0.2 (L) 09/11/2022   ALKPHOS 74 09/11/2022   AST 18 09/11/2022   ALT 14 09/11/2022   ANIONGAP 9 09/12/2022    CBG (last 3)  No results for input(s): "GLUCAP" in the last  72 hours.    Coagulation Profile: No results for input(s): "INR", "PROTIME" in the last 168 hours.   Radiology Studies: DG ESOPHAGUS W SINGLE CM (SOL OR THIN BA)  Result Date: 09/13/2022 CLINICAL DATA:  Provided history: Dysphagia. Additional history: The patient reports feeling as though foods are becoming stuck in her esophagus, sometimes with regurgitation. EXAM: ESOPHOGRAM/BARIUM SWALLOW TECHNIQUE: A single contrast examination was performed using thin liquid barium contrast. Additionally, the patient swallowed a 13 mm barium tablet under fluoroscopy. FLUOROSCOPY: Fluoroscopy time: 3 minutes (49.4 mGy). COMPARISON:  Chest CT 09/12/2022. FINDINGS: The examination was somewhat limited by the patient's limited ability to stand and limited ability to reposition on the fluoroscopy table. The examination was performed with the patient in the right lateral decubitus and semi-recumbent positions. There was delayed and intermittent passage of contrast from the distal esophagus into the stomach. Additionally, a swallowed 13 mm barium tablet did not pass beyond the level of the distal esophagus despite a prolonged period of observation, and despite the patient taking additional water and liquid barium contrast. These findings suggest the presence of a distal esophageal stricture. Moderate intermittent esophageal dysmotility with tertiary contractions. Possible small sliding hiatal hernia. No gastroesophageal reflux was observed. IMPRESSION: Examination somewhat limited by the patient's limited ability to stand and limited ability to reposition on the fluoroscopy table. The examination was performed with the patient in the right lateral decubitus and semi-recumbent positions. There was delayed and intermittent  passage of contrast from the distal esophagus into the stomach. Additionally, a swallowed 13 mm barium tablet did not pass beyond the level of the distal esophagus despite a prolonged period of observation, and despite the patient taking additional water and liquid barium contrast. These findings suggest the presence of a distal esophageal stricture, and endoscopy should be considered for further evaluation. Moderate esophageal dysmotility with tertiary contractions. Possible small sliding hiatal hernia. Electronically Signed   By: Kellie Simmering D.O.   On: 09/13/2022 16:16       Hosie Poisson M.D. [facility] 09/14/2022, 11:39 AM  Available via Epic secure chat 7am-7pm After 7 pm, please refer to night coverage provider listed on amion.
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
## 2022-09-14 NOTE — Anesthesia Postprocedure Evaluation (Signed)
Anesthesia Post Note  Patient: Lindsey [name]  Procedure(s) Performed: ESOPHAGOGASTRODUODENOSCOPY (EGD) WITH PROPOFOL BIOPSY SAVORY DILATION     Patient location during evaluation: Endoscopy Anesthesia Type: MAC Level of consciousness: awake and alert Pain management: pain level controlled Vital Signs Assessment: post-procedure vital signs reviewed and stable Respiratory status: spontaneous breathing, nonlabored ventilation and respiratory function stable Cardiovascular status: blood pressure returned to baseline and stable Postop Assessment: no apparent nausea or vomiting Anesthetic complications: no   No notable events documented.  Last Vitals:  Vitals:   09/14/22 1345 09/14/22 1401  BP: (!) 144/53 (!) 147/71  Pulse: 86 92  Resp: (!) 24 18  Temp:  36.8 C  SpO2: 95% 95%    Last Pain:  Vitals:   09/14/22 1401  TempSrc: Oral  PainSc:                  Lidia Collum

## 2022-09-14 NOTE — Progress Notes (Signed)
  PT Cancellation Note  Patient Details Name: Lindsey [name] MRN: [id number] DOB: August 15, 1956   Cancelled Treatment:    Reason Eval/Treat Not Completed: Patient at procedure or test/unavailable  Will check back another time. [place] Office [phone] Weekend [phone]    Claretha Cooper 09/14/2022, 12:03 PM

## 2022-09-15 DIAGNOSIS — R933 Abnormal findings on diagnostic imaging of other parts of digestive tract: Secondary | ICD-10-CM

## 2022-09-15 DIAGNOSIS — G40909 Epilepsy, unspecified, not intractable, without status epilepticus: Secondary | ICD-10-CM

## 2022-09-15 DIAGNOSIS — J189 Pneumonia, unspecified organism: Secondary | ICD-10-CM | POA: Insufficient documentation

## 2022-09-15 DIAGNOSIS — J9601 Acute respiratory failure with hypoxia: Secondary | ICD-10-CM | POA: Diagnosis not present

## 2022-09-15 DIAGNOSIS — R1319 Other dysphagia: Secondary | ICD-10-CM | POA: Diagnosis not present

## 2022-09-15 DIAGNOSIS — Z5900 Homelessness unspecified: Secondary | ICD-10-CM

## 2022-09-15 LAB — CBC WITH DIFFERENTIAL/PLATELET
Abs Immature Granulocytes: 0.14 10*3/uL — ABNORMAL HIGH (ref 0.00–0.07)
Basophils Absolute: 0.1 10*3/uL (ref 0.0–0.1)
Basophils Relative: 1 %
Eosinophils Absolute: 0.1 10*3/uL (ref 0.0–0.5)
Eosinophils Relative: 2 %
HCT: 31.7 % — ABNORMAL LOW (ref 36.0–46.0)
Hemoglobin: 9.8 g/dL — ABNORMAL LOW (ref 12.0–15.0)
Immature Granulocytes: 2 %
Lymphocytes Relative: 24 %
Lymphs Abs: 2 10*3/uL (ref 0.7–4.0)
MCH: 28.6 pg (ref 26.0–34.0)
MCHC: 30.9 g/dL (ref 30.0–36.0)
MCV: 92.4 fL (ref 80.0–100.0)
Monocytes Absolute: 1.3 10*3/uL — ABNORMAL HIGH (ref 0.1–1.0)
Monocytes Relative: 16 %
Neutro Abs: 4.7 10*3/uL (ref 1.7–7.7)
Neutrophils Relative %: 55 %
Platelets: 463 10*3/uL — ABNORMAL HIGH (ref 150–400)
RBC: 3.43 MIL/uL — ABNORMAL LOW (ref 3.87–5.11)
RDW: 14.4 % (ref 11.5–15.5)
WBC: 8.2 10*3/uL (ref 4.0–10.5)
nRBC: 0 % (ref 0.0–0.2)

## 2022-09-15 LAB — BASIC METABOLIC PANEL
Anion gap: 7 (ref 5–15)
BUN: 6 mg/dL — ABNORMAL LOW (ref 8–23)
CO2: 27 mmol/L (ref 22–32)
Calcium: 8.8 mg/dL — ABNORMAL LOW (ref 8.9–10.3)
Chloride: 104 mmol/L (ref 98–111)
Creatinine, Ser: 0.59 mg/dL (ref 0.44–1.00)
GFR, Estimated: >60 mL/min (ref >60)
Glucose, Bld: 112 mg/dL — ABNORMAL HIGH (ref 70–99)
Potassium: 4 mmol/L (ref 3.5–5.1)
Sodium: 138 mmol/L (ref 135–145)

## 2022-09-15 LAB — SURGICAL PATHOLOGY

## 2022-09-15 MED ORDER — PANTOPRAZOLE SODIUM 40 MG PO TBEC: 40.0000 mg | DELAYED_RELEASE_TABLET | Freq: Every day | ORAL | 0 refills | Status: DC

## 2022-09-15 MED ORDER — GUAIFENESIN ER 600 MG PO TB12: 600.0000 mg | ORAL_TABLET | Freq: Two times a day (BID) | ORAL | 0 refills | Status: AC

## 2022-09-15 MED ORDER — ONDANSETRON HCL 4 MG PO TABS: 4.0000 mg | ORAL_TABLET | Freq: Three times a day (TID) | ORAL | 0 refills | Status: DC | PRN

## 2022-09-15 MED ORDER — MAGIC MOUTHWASH W/LIDOCAINE
5.0000 mL | Freq: Four times a day (QID) | ORAL | Status: DC
  Filled 2022-09-15 (×4): qty 5
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
## 2022-09-15 NOTE — TOC Transition Note (Signed)
Transition of Care [facility]) - CM/SW Discharge Note   Patient Details  Name: Corbin Hott MRN: [id number] Date of Birth: Dec 06, 1955  [facility] CM/SW Contact:  Leeroy Cha, RN Phone Number: 09/15/2022, 10:04 AM   Clinical Narrative:    Patient dcd to return home.  [facility] notified. Message left on voice mail to please return call.     Barriers to Discharge: Continued Medical Work up   Patient Goals and CMS Choice Patient states their goals for this hospitalization and ongoing recovery are:: to get well and stay home CMS Medicare.gov Compare Post Acute Care list provided to:: Patient Choice offered to / list presented to : Adult Children    Discharge Placement                       Discharge Plan and Services   Discharge Planning Services: CM Consult Post Acute Care Choice: Resumption of Svcs/PTA Provider                               Social Determinants of Health (SDOH) Interventions     Readmission Risk Interventions   Row Labels 09/09/2022   12:02 PM 09/08/2022    1:22 PM 09/12/2020    1:31 PM  Readmission Risk Prevention Plan   Section Header. No data exists in this row.     Transportation Screening   Complete Complete Complete  PCP or Specialist Appt within 5-7 Days   Complete Complete Complete  Home Care Screening   Complete Complete Complete  Medication Review (RN CM)   Complete Complete Referral to Pharmacy

## 2022-09-15 NOTE — Care Management Important Message (Signed)
Important Message  Patient Details IM Letter given. Name: Lindsey [name] MRN: [id number] Date of Birth: Aug 21, 1956   Medicare Important Message Given:  Yes     Kerin Salen 09/15/2022, 10:42 AM

## 2022-09-15 NOTE — Discharge Summary (Signed)
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 inadequate volume of blood received in culture bottles Performed at [place] [address], [place], [place] [address]    Culture   Final    NO GROWTH 3 DAYS Performed at [facility], [place] [address], [place], [place] [address]    Report Status PENDING  Incomplete  Resp panel by RT-PCR (RSV, Flu A&B, Covid) Anterior Nasal Swab     Status: None   Collection Time: 09/11/22 10:43 PM   Specimen: Anterior Nasal Swab  Result Value Ref Range  Status   SARS Coronavirus 2 by RT PCR NEGATIVE NEGATIVE Final    Comment: (NOTE) SARS-CoV-2 target nucleic acids are NOT DETECTED.  The SARS-CoV-2 RNA is generally detectable in upper respiratory specimens during the  acute phase of infection. The lowest concentration of SARS-CoV-2 viral copies this assay can detect is 138 copies/mL. A negative result does not preclude SARS-Cov-2 infection and should not be used as the sole basis for treatment or other patient management decisions. A negative result may occur with  improper specimen collection/handling, submission of specimen other than nasopharyngeal swab, presence of viral mutation(s) within the areas targeted by this assay, and inadequate number of viral copies(<138 copies/mL). A negative result must be combined with clinical observations, patient history, and epidemiological information. The expected result is Negative.  Fact Sheet for Patients:  EntrepreneurPulse.com.au  Fact Sheet for Healthcare Providers:  IncredibleEmployment.be  This test is no t yet approved or cleared by the [place] FDA and  has been authorized for detection and/or diagnosis of SARS-CoV-2 by FDA under an Emergency Use Authorization (EUA). This EUA will remain  in effect (meaning this test can be used) for the duration of the COVID-19 declaration under Section 564(b)(1) of the Act, 21 U.S.C.section 360bbb-3(b)(1), unless the authorization is terminated  or revoked sooner.       Influenza A by PCR NEGATIVE NEGATIVE Final   Influenza B by PCR NEGATIVE NEGATIVE Final    Comment: (NOTE) The Xpert Xpress SARS-CoV-2/FLU/RSV plus assay is intended as an aid in the diagnosis of influenza from Nasopharyngeal swab specimens and should not be used as a sole basis for treatment. Nasal washings and aspirates are unacceptable for Xpert Xpress SARS-CoV-2/FLU/RSV testing.  Fact Sheet for  Patients: EntrepreneurPulse.com.au  Fact Sheet for Healthcare Providers: IncredibleEmployment.be  This test is not yet approved or cleared by the [place] FDA and has been authorized for detection and/or diagnosis of SARS-CoV-2 by FDA under an Emergency Use Authorization (EUA). This EUA will remain in effect (meaning this test can be used) for the duration of the COVID-19 declaration under Section 564(b)(1) of the Act, 21 U.S.C. section 360bbb-3(b)(1), unless the authorization is terminated or revoked.     Resp Syncytial Virus by PCR NEGATIVE NEGATIVE Final    Comment: (NOTE) Fact Sheet for Patients: EntrepreneurPulse.com.au  Fact Sheet for Healthcare Providers: IncredibleEmployment.be  This test is not yet approved or cleared by the [place] FDA and has been authorized for detection and/or diagnosis of SARS-CoV-2 by FDA under an Emergency Use Authorization (EUA). This EUA will remain in effect (meaning this test can be used) for the duration of the COVID-19 declaration under Section 564(b)(1) of the Act, 21 U.S.C. section 360bbb-3(b)(1), unless the authorization is terminated or revoked.  Performed at [facility], [place] [address], [place], [place] [address]   Culture, blood (Routine X 2) w Reflex to ID Panel     Status: None (Preliminary result)   Collection Time: 09/12/22 12:05 AM   Specimen: BLOOD  Result Value Ref Range Status   Specimen Description   Final    BLOOD LEFT ARM Performed at [place] [address] [place], [place] [address]    Special Requests   Final    BOTTLES DRAWN AEROBIC AND ANAEROBIC Blood Culture adequate volume Performed at [place] [address], [place], [place] [address]    Culture   Final    NO GROWTH 3 DAYS Performed at [facility], [place] [address], [place], [place] [address]     Report Status PENDING  Incomplete     Labs:  CBC: Recent Labs  Lab 09/09/22 1032 09/10/22 1007 09/11/22 2255 09/12/22 0515 09/15/22 0419  WBC  12.7* 12.6* 13.3* 11.5* 8.2  NEUTROABS 8.8* 8.4* 8.7*  --  4.7  HGB 10.6* 11.8* 12.1 10.1* 9.8*  HCT 33.3* 38.0 38.8 31.9* 31.7*  MCV 92.2 93.6 93.3 91.7 92.4  PLT 340 370 407* 418* 463*   BMP &GFR Recent Labs  Lab 09/09/22 1032 09/10/22 1113 09/11/22 2255 09/12/22 0515 09/12/22 0600 09/15/22 0419  NA 140 140 139  --  139 138  K 4.4 3.9 4.1  --  3.5 4.0  CL 109 102 102  --  100 104  CO2 '27 30 28  '$ --  30 27  GLUCOSE 118* 110* 72  --  75 112*  BUN '14 11 9  '$ --  8 6*  CREATININE 0.69 0.52 0.56  --  0.56 0.59  CALCIUM 9.0 9.4 8.9  --  8.5* 8.8*  MG 2.0 1.5*  --  1.9  --   --   PHOS  --   --   --  3.9  --   --    Estimated Creatinine Clearance: 78.2 mL/min (by C-G formula based on SCr of 0.59 mg/dL). Liver & Pancreas: Recent Labs  Lab 09/09/22 1032 09/10/22 1113 09/11/22 2255  AST '20 16 18  '$ ALT '14 13 14  '$ ALKPHOS 70 67 74  BILITOT 0.1* 0.1* 0.2*  PROT 6.6 7.1 7.6  ALBUMIN 2.4* 2.4* 2.9*   Recent Labs  Lab 09/10/22 1113  LIPASE 29   No results for input(s): "AMMONIA" in the last 168 hours. Diabetic: No results for input(s): "HGBA1C" in the last 72 hours. No results for input(s): "GLUCAP" in the last 168 hours. Cardiac Enzymes: No results for input(s): "CKTOTAL", "CKMB", "CKMBINDEX", "TROPONINI" in the last 168 hours. No results for input(s): "PROBNP" in the last 8760 hours. Coagulation Profile: No results for input(s): "INR", "PROTIME" in the last 168 hours. Thyroid Function Tests: No results for input(s): "TSH", "T4TOTAL", "FREET4", "T3FREE", "THYROIDAB" in the last 72 hours. Lipid Profile: No results for input(s): "CHOL", "HDL", "LDLCALC", "TRIG", "CHOLHDL", "LDLDIRECT" in the last 72 hours. Anemia Panel: No results for input(s): "VITAMINB12", "FOLATE", "FERRITIN", "TIBC", "IRON", "RETICCTPCT" in the last 72  hours. Urine analysis:    Component Value Date/Time   COLORURINE YELLOW 09/08/2022 0014   APPEARANCEUR CLEAR 09/08/2022 0014   APPEARANCEUR Clear 01/28/2014 1937   LABSPEC >1.046 (H) 09/08/2022 0014   LABSPEC 1.015 01/28/2014 1937   PHURINE 5.0 09/08/2022 0014   GLUCOSEU NEGATIVE 09/08/2022 0014   GLUCOSEU Negative 01/28/2014 1937   HGBUR SMALL (A) 09/08/2022 0014   BILIRUBINUR NEGATIVE 09/08/2022 0014   BILIRUBINUR Negative 01/28/2014 1937   KETONESUR 20 (A) 09/08/2022 0014   PROTEINUR >=300 (A) 09/08/2022 0014   UROBILINOGEN 0.2 03/01/2015 1935   NITRITE NEGATIVE 09/08/2022 0014   LEUKOCYTESUR NEGATIVE 09/08/2022 0014   LEUKOCYTESUR Negative 01/28/2014 1937   Sepsis Labs: Invalid input(s): "PROCALCITONIN", "LACTICIDVEN"   SIGNED:  Mercy Riding, MD  [facility] 09/15/2022, 5:00 PM

## 2022-09-15 NOTE — Progress Notes (Signed)
Pt discharged to home. Prior to dc, IV and tele were removed. Pt was given DC paperwork regarding medications, appointments, and condition. Pt verbalized understanding and stated no other concerns at this time. Pt stable at time of dc and left in vehicle driven by [facility] driver in their vehicle.

## 2022-09-17 LAB — CULTURE, BLOOD (ROUTINE X 2)
Culture: NO GROWTH
Culture: NO GROWTH
Special Requests: ADEQUATE

## 2022-09-20 ENCOUNTER — Encounter (HOSPITAL_COMMUNITY): Payer: Self-pay | Admitting: Gastroenterology

## 2022-10-04 ENCOUNTER — Encounter: Payer: Self-pay | Admitting: Gastroenterology

## 2022-11-22 ENCOUNTER — Ambulatory Visit: Payer: Medicaid Other

## 2022-12-08 ENCOUNTER — Other Ambulatory Visit (HOSPITAL_COMMUNITY): Payer: Self-pay

## 2022-12-08 DIAGNOSIS — R1310 Dysphagia, unspecified: Secondary | ICD-10-CM

## 2023-01-04 ENCOUNTER — Ambulatory Visit (HOSPITAL_COMMUNITY): Payer: Medicaid Other

## 2023-01-04 ENCOUNTER — Encounter (HOSPITAL_COMMUNITY): Payer: Medicaid Other

## 2023-01-10 ENCOUNTER — Other Ambulatory Visit: Payer: Self-pay | Admitting: Internal Medicine

## 2023-01-10 ENCOUNTER — Ambulatory Visit
Admission: RE | Admit: 2023-01-10 | Discharge: 2023-01-10 | Disposition: A | Discharge status: Home / Self Care | Payer: Medicaid Other | Source: Ambulatory Visit | Attending: Internal Medicine | Admitting: Internal Medicine

## 2023-01-10 ENCOUNTER — Ambulatory Visit (HOSPITAL_COMMUNITY)
Admission: RE | Admit: 2023-01-10 | Discharge: 2023-01-10 | Disposition: A | Discharge status: Home / Self Care | Payer: Medicare (Managed Care) | Source: Ambulatory Visit | Attending: Family Medicine | Admitting: Family Medicine

## 2023-01-10 ENCOUNTER — Ambulatory Visit (HOSPITAL_COMMUNITY)
Admission: RE | Admit: 2023-01-10 | Discharge: 2023-01-10 | Disposition: A | Discharge status: Home / Self Care | Payer: Medicare (Managed Care) | Source: Ambulatory Visit | Attending: Internal Medicine | Admitting: Internal Medicine

## 2023-01-10 DIAGNOSIS — J449 Chronic obstructive pulmonary disease, unspecified: Secondary | ICD-10-CM

## 2023-01-10 DIAGNOSIS — R1310 Dysphagia, unspecified: Secondary | ICD-10-CM | POA: Insufficient documentation

## 2023-01-12 ENCOUNTER — Inpatient Hospital Stay: Admission: RE | Admit: 2023-01-12 | Payer: Medicaid Other | Source: Ambulatory Visit

## 2023-03-21 ENCOUNTER — Other Ambulatory Visit: Payer: Self-pay | Admitting: Family Medicine

## 2023-03-21 DIAGNOSIS — Z0001 Encounter for general adult medical examination with abnormal findings: Secondary | ICD-10-CM

## 2023-04-12 ENCOUNTER — Emergency Department (HOSPITAL_COMMUNITY): Payer: Medicare (Managed Care)

## 2023-04-12 ENCOUNTER — Inpatient Hospital Stay (HOSPITAL_COMMUNITY)
Admission: EM | Admit: 2023-04-12 | Discharge: 2023-04-19 | DRG: 871 | Disposition: A | Discharge status: Home / Self Care | Payer: Medicare (Managed Care) | Attending: Internal Medicine | Admitting: Internal Medicine

## 2023-04-12 ENCOUNTER — Encounter (HOSPITAL_COMMUNITY): Payer: Self-pay | Admitting: Family Medicine

## 2023-04-12 DIAGNOSIS — Z79899 Other long term (current) drug therapy: Secondary | ICD-10-CM | POA: Diagnosis not present

## 2023-04-12 DIAGNOSIS — J45909 Unspecified asthma, uncomplicated: Secondary | ICD-10-CM | POA: Diagnosis present

## 2023-04-12 DIAGNOSIS — K219 Gastro-esophageal reflux disease without esophagitis: Secondary | ICD-10-CM | POA: Diagnosis present

## 2023-04-12 DIAGNOSIS — Z1152 Encounter for screening for COVID-19: Secondary | ICD-10-CM | POA: Diagnosis not present

## 2023-04-12 DIAGNOSIS — Z833 Family history of diabetes mellitus: Secondary | ICD-10-CM | POA: Diagnosis not present

## 2023-04-12 DIAGNOSIS — R509 Fever, unspecified: Secondary | ICD-10-CM | POA: Diagnosis present

## 2023-04-12 DIAGNOSIS — I10 Essential (primary) hypertension: Secondary | ICD-10-CM | POA: Diagnosis not present

## 2023-04-12 DIAGNOSIS — J4520 Mild intermittent asthma, uncomplicated: Secondary | ICD-10-CM

## 2023-04-12 DIAGNOSIS — Z90710 Acquired absence of both cervix and uterus: Secondary | ICD-10-CM | POA: Diagnosis not present

## 2023-04-12 DIAGNOSIS — N151 Renal and perinephric abscess: Secondary | ICD-10-CM | POA: Diagnosis present

## 2023-04-12 DIAGNOSIS — E875 Hyperkalemia: Secondary | ICD-10-CM | POA: Diagnosis not present

## 2023-04-12 DIAGNOSIS — Z880 Allergy status to penicillin: Secondary | ICD-10-CM

## 2023-04-12 DIAGNOSIS — R54 Age-related physical debility: Secondary | ICD-10-CM | POA: Diagnosis present

## 2023-04-12 DIAGNOSIS — Z87891 Personal history of nicotine dependence: Secondary | ICD-10-CM

## 2023-04-12 DIAGNOSIS — Z8249 Family history of ischemic heart disease and other diseases of the circulatory system: Secondary | ICD-10-CM | POA: Diagnosis not present

## 2023-04-12 DIAGNOSIS — Z885 Allergy status to narcotic agent status: Secondary | ICD-10-CM | POA: Diagnosis not present

## 2023-04-12 DIAGNOSIS — R6520 Severe sepsis without septic shock: Secondary | ICD-10-CM | POA: Diagnosis not present

## 2023-04-12 DIAGNOSIS — G40909 Epilepsy, unspecified, not intractable, without status epilepticus: Secondary | ICD-10-CM | POA: Diagnosis not present

## 2023-04-12 DIAGNOSIS — N179 Acute kidney failure, unspecified: Secondary | ICD-10-CM | POA: Diagnosis not present

## 2023-04-12 DIAGNOSIS — Z8711 Personal history of peptic ulcer disease: Secondary | ICD-10-CM

## 2023-04-12 DIAGNOSIS — A419 Sepsis, unspecified organism: Principal | ICD-10-CM | POA: Diagnosis present

## 2023-04-12 DIAGNOSIS — N12 Tubulo-interstitial nephritis, not specified as acute or chronic: Secondary | ICD-10-CM | POA: Diagnosis not present

## 2023-04-12 DIAGNOSIS — R112 Nausea with vomiting, unspecified: Secondary | ICD-10-CM | POA: Diagnosis present

## 2023-04-12 DIAGNOSIS — E876 Hypokalemia: Secondary | ICD-10-CM | POA: Diagnosis not present

## 2023-04-12 DIAGNOSIS — Z800 Family history of malignant neoplasm of digestive organs: Secondary | ICD-10-CM

## 2023-04-12 LAB — CBC WITH DIFFERENTIAL/PLATELET
Abs Immature Granulocytes: 0.05 10*3/uL (ref 0.00–0.07)
Basophils Absolute: 0.1 10*3/uL (ref 0.0–0.1)
Basophils Relative: 0 %
Eosinophils Absolute: 0.1 10*3/uL (ref 0.0–0.5)
Eosinophils Relative: 0 %
HCT: 41.7 % (ref 36.0–46.0)
Hemoglobin: 14 g/dL (ref 12.0–15.0)
Immature Granulocytes: 0 %
Lymphocytes Relative: 14 %
Lymphs Abs: 1.9 10*3/uL (ref 0.7–4.0)
MCH: 30.2 pg (ref 26.0–34.0)
MCHC: 33.6 g/dL (ref 30.0–36.0)
MCV: 89.9 fL (ref 80.0–100.0)
Monocytes Absolute: 1.4 10*3/uL — ABNORMAL HIGH (ref 0.1–1.0)
Monocytes Relative: 10 %
Neutro Abs: 10.4 10*3/uL — ABNORMAL HIGH (ref 1.7–7.7)
Neutrophils Relative %: 76 %
Platelets: 313 10*3/uL (ref 150–400)
RBC: 4.64 MIL/uL (ref 3.87–5.11)
RDW: 13.7 % (ref 11.5–15.5)
WBC: 13.9 10*3/uL — ABNORMAL HIGH (ref 4.0–10.5)
nRBC: 0 % (ref 0.0–0.2)

## 2023-04-12 LAB — COMPREHENSIVE METABOLIC PANEL
ALT: 16 U/L (ref 0–44)
AST: 27 U/L (ref 15–41)
Albumin: 3.5 g/dL (ref 3.5–5.0)
Alkaline Phosphatase: 100 U/L (ref 38–126)
Anion gap: 13 (ref 5–15)
BUN: 22 mg/dL (ref 8–23)
CO2: 22 mmol/L (ref 22–32)
Calcium: 9.3 mg/dL (ref 8.9–10.3)
Chloride: 101 mmol/L (ref 98–111)
Creatinine, Ser: 1.27 mg/dL — ABNORMAL HIGH (ref 0.44–1.00)
GFR, Estimated: 47 mL/min — ABNORMAL LOW (ref >60)
Glucose, Bld: 109 mg/dL — ABNORMAL HIGH (ref 70–99)
Potassium: 3.7 mmol/L (ref 3.5–5.1)
Sodium: 136 mmol/L (ref 135–145)
Total Bilirubin: 0.5 mg/dL (ref 0.3–1.2)
Total Protein: 8 g/dL (ref 6.5–8.1)

## 2023-04-12 LAB — URINALYSIS, W/ REFLEX TO CULTURE (INFECTION SUSPECTED)
Bilirubin Urine: NEGATIVE
Glucose, UA: NEGATIVE mg/dL
Hgb urine dipstick: NEGATIVE
Ketones, ur: NEGATIVE mg/dL
Nitrite: POSITIVE — AB
Protein, ur: NEGATIVE mg/dL
Specific Gravity, Urine: 1.017 (ref 1.005–1.030)
WBC, UA: >50 WBC/hpf (ref 0–5)
pH: 6 (ref 5.0–8.0)

## 2023-04-12 LAB — RESP PANEL BY RT-PCR (RSV, FLU A&B, COVID)  RVPGX2
Influenza A by PCR: NEGATIVE
Influenza B by PCR: NEGATIVE
Resp Syncytial Virus by PCR: NEGATIVE
SARS Coronavirus 2 by RT PCR: NEGATIVE

## 2023-04-12 LAB — PROTIME-INR
INR: 1.1 (ref 0.8–1.2)
Prothrombin Time: 14.7 seconds (ref 11.4–15.2)

## 2023-04-12 LAB — I-STAT CG4 LACTIC ACID, ED
Lactic Acid, Venous: 1.1 mmol/L (ref 0.5–1.9)
Lactic Acid, Venous: 1 mmol/L (ref 0.5–1.9)

## 2023-04-12 LAB — APTT: aPTT: 34 seconds (ref 24–36)

## 2023-04-12 MED ORDER — ALBUTEROL SULFATE HFA 108 (90 BASE) MCG/ACT IN AERS: 2.0000 | INHALATION_SPRAY | RESPIRATORY_TRACT | Status: DC | PRN

## 2023-04-12 MED ORDER — ACETAMINOPHEN 325 MG PO TABS
650.0000 mg | ORAL_TABLET | Freq: Four times a day (QID) | ORAL | Status: DC | PRN
  Administered 2023-04-17: 650 mg via ORAL
  Filled 2023-04-12: qty 2

## 2023-04-12 MED ORDER — METRONIDAZOLE 500 MG/100ML IV SOLN
500.0000 mg | Freq: Once | INTRAVENOUS | Status: AC
  Administered 2023-04-12: 500 mg via INTRAVENOUS
  Filled 2023-04-12: qty 100

## 2023-04-12 MED ORDER — IPRATROPIUM-ALBUTEROL 0.5-2.5 (3) MG/3ML IN SOLN
3.0000 mL | Freq: Once | RESPIRATORY_TRACT | Status: AC
  Administered 2023-04-12: 3 mL via RESPIRATORY_TRACT
  Filled 2023-04-12: qty 3

## 2023-04-12 MED ORDER — EPINEPHRINE 0.3 MG/0.3ML IJ SOAJ: 0.3000 mg | Freq: Once | INTRAMUSCULAR | Status: AC

## 2023-04-12 MED ORDER — ACETAMINOPHEN 325 MG PO TABS
650.0000 mg | ORAL_TABLET | Freq: Once | ORAL | Status: AC
  Administered 2023-04-12: 650 mg via ORAL
  Filled 2023-04-12: qty 2

## 2023-04-12 MED ORDER — SODIUM CHLORIDE 0.9% FLUSH
3.0000 mL | Freq: Two times a day (BID) | INTRAVENOUS | Status: DC
  Administered 2023-04-12 – 2023-04-19 (×12): 3 mL via INTRAVENOUS

## 2023-04-12 MED ORDER — FENTANYL CITRATE PF 50 MCG/ML IJ SOSY
12.5000 ug | PREFILLED_SYRINGE | INTRAMUSCULAR | Status: DC | PRN
  Administered 2023-04-14 – 2023-04-15 (×2): 50 ug via INTRAVENOUS
  Filled 2023-04-12 (×3): qty 1

## 2023-04-12 MED ORDER — SENNOSIDES-DOCUSATE SODIUM 8.6-50 MG PO TABS: 1.0000 | ORAL_TABLET | Freq: Every evening | ORAL | Status: DC | PRN

## 2023-04-12 MED ORDER — LACTATED RINGERS IV SOLN: INTRAVENOUS | Status: DC

## 2023-04-12 MED ORDER — PANTOPRAZOLE SODIUM 40 MG PO TBEC
40.0000 mg | DELAYED_RELEASE_TABLET | Freq: Every day | ORAL | Status: DC
  Administered 2023-04-13 – 2023-04-19 (×7): 40 mg via ORAL
  Filled 2023-04-12 (×7): qty 1

## 2023-04-12 MED ORDER — ACETAMINOPHEN 650 MG RE SUPP: 650.0000 mg | Freq: Four times a day (QID) | RECTAL | Status: DC | PRN

## 2023-04-12 MED ORDER — LEVETIRACETAM 500 MG PO TABS
750.0000 mg | ORAL_TABLET | Freq: Two times a day (BID) | ORAL | Status: DC
  Administered 2023-04-12 – 2023-04-19 (×14): 750 mg via ORAL
  Filled 2023-04-12 (×14): qty 1

## 2023-04-12 MED ORDER — ONDANSETRON HCL 4 MG PO TABS: 4.0000 mg | ORAL_TABLET | Freq: Four times a day (QID) | ORAL | Status: DC | PRN

## 2023-04-12 MED ORDER — OXYCODONE HCL 5 MG PO TABS
5.0000 mg | ORAL_TABLET | ORAL | Status: DC | PRN
  Administered 2023-04-12: 5 mg via ORAL
  Filled 2023-04-12: qty 1

## 2023-04-12 MED ORDER — ACETAMINOPHEN 325 MG PO TABS
ORAL_TABLET | ORAL | Status: AC
  Filled 2023-04-12: qty 2

## 2023-04-12 MED ORDER — ALBUTEROL SULFATE (2.5 MG/3ML) 0.083% IN NEBU: 2.5000 mg | INHALATION_SOLUTION | RESPIRATORY_TRACT | Status: DC | PRN

## 2023-04-12 MED ORDER — ONDANSETRON HCL 4 MG/2ML IJ SOLN: 4.0000 mg | Freq: Four times a day (QID) | INTRAMUSCULAR | Status: DC | PRN

## 2023-04-12 MED ORDER — DIVALPROEX SODIUM ER 500 MG PO TB24
500.0000 mg | ORAL_TABLET | Freq: Two times a day (BID) | ORAL | Status: DC
  Administered 2023-04-12 – 2023-04-19 (×14): 500 mg via ORAL
  Filled 2023-04-12 (×14): qty 1

## 2023-04-12 MED ORDER — IOHEXOL 350 MG/ML SOLN
60.0000 mL | Freq: Once | INTRAVENOUS | Status: AC | PRN
  Administered 2023-04-12: 60 mL via INTRAVENOUS

## 2023-04-12 MED ORDER — SODIUM CHLORIDE 0.9 % IV SOLN
2.0000 g | INTRAVENOUS | Status: AC
  Administered 2023-04-13 – 2023-04-19 (×7): 2 g via INTRAVENOUS
  Filled 2023-04-12 (×7): qty 20

## 2023-04-12 MED ORDER — VANCOMYCIN HCL IN DEXTROSE 1-5 GM/200ML-% IV SOLN: 1000.0000 mg | Freq: Once | INTRAVENOUS | Status: DC

## 2023-04-12 MED ORDER — DIPHENHYDRAMINE HCL 25 MG PO CAPS
25.0000 mg | ORAL_CAPSULE | Freq: Once | ORAL | Status: AC | PRN
  Administered 2023-04-13: 25 mg via ORAL
  Filled 2023-04-12: qty 1

## 2023-04-12 MED ORDER — VANCOMYCIN HCL 1750 MG/350ML IV SOLN
1750.0000 mg | Freq: Once | INTRAVENOUS | Status: AC
  Administered 2023-04-12: 1750 mg via INTRAVENOUS
  Filled 2023-04-12: qty 350

## 2023-04-12 MED ORDER — DIPHENHYDRAMINE HCL 50 MG/ML IJ SOLN
25.0000 mg | Freq: Once | INTRAMUSCULAR | Status: AC
  Administered 2023-04-12: 25 mg via INTRAVENOUS
  Filled 2023-04-12: qty 1

## 2023-04-12 MED ORDER — LACTATED RINGERS IV BOLUS (SEPSIS)
1000.0000 mL | Freq: Once | INTRAVENOUS | Status: AC
  Administered 2023-04-12: 1000 mL via INTRAVENOUS

## 2023-04-12 MED ORDER — FAMOTIDINE IN NACL 20-0.9 MG/50ML-% IV SOLN
20.0000 mg | Freq: Once | INTRAVENOUS | Status: AC
  Administered 2023-04-12: 20 mg via INTRAVENOUS
  Filled 2023-04-12: qty 50

## 2023-04-12 MED ORDER — LACTATED RINGERS IV BOLUS (SEPSIS)
500.0000 mL | Freq: Once | INTRAVENOUS | Status: AC
  Administered 2023-04-12: 500 mL via INTRAVENOUS

## 2023-04-12 MED ORDER — ONDANSETRON HCL 4 MG/2ML IJ SOLN
4.0000 mg | Freq: Once | INTRAMUSCULAR | Status: AC
  Administered 2023-04-12: 4 mg via INTRAVENOUS
  Filled 2023-04-12: qty 2

## 2023-04-12 MED ORDER — METHYLPREDNISOLONE SODIUM SUCC 125 MG IJ SOLR
125.0000 mg | Freq: Once | INTRAMUSCULAR | Status: AC
  Administered 2023-04-12: 125 mg via INTRAVENOUS
  Filled 2023-04-12: qty 2

## 2023-04-12 MED ORDER — FENTANYL CITRATE PF 50 MCG/ML IJ SOSY
50.0000 ug | PREFILLED_SYRINGE | Freq: Once | INTRAMUSCULAR | Status: AC
  Administered 2023-04-12: 50 ug via INTRAVENOUS
  Filled 2023-04-12: qty 1

## 2023-04-12 MED ORDER — SODIUM CHLORIDE 0.9 % IV SOLN
2.0000 g | Freq: Once | INTRAVENOUS | Status: AC
  Administered 2023-04-12: 2 g via INTRAVENOUS
  Filled 2023-04-12: qty 12.5

## 2023-04-12 MED ORDER — LACTATED RINGERS IV SOLN: INTRAVENOUS | Status: AC

## 2023-04-12 MED ORDER — ENOXAPARIN SODIUM 40 MG/0.4ML IJ SOSY
40.0000 mg | PREFILLED_SYRINGE | INTRAMUSCULAR | Status: DC
  Administered 2023-04-12 – 2023-04-18 (×7): 40 mg via SUBCUTANEOUS
  Filled 2023-04-12 (×7): qty 0.4

## 2023-04-12 MED ORDER — ROSUVASTATIN CALCIUM 5 MG PO TABS
10.0000 mg | ORAL_TABLET | Freq: Every day | ORAL | Status: DC
  Administered 2023-04-13 – 2023-04-19 (×7): 10 mg via ORAL
  Filled 2023-04-12 (×7): qty 2

## 2023-04-12 MED ORDER — EPINEPHRINE 0.3 MG/0.3ML IJ SOAJ
INTRAMUSCULAR | Status: AC
  Administered 2023-04-12: 0.3 mg via INTRAMUSCULAR
  Filled 2023-04-12: qty 0.3

## 2023-04-12 NOTE — ED Triage Notes (Signed)
Pt to ED via EMS from home. Pt states she went to [place] on Saturday and states she was bit by 3 mosquitos. Pt c/o N/V fever and chills since she was bitten. Pt states she has been unable to keep anything down since Saturday. Pt endorses diffuse abd pain. Pt c/o fevers at home since Saturday. Pt's daughter gave pt tylenol at home at [facility].   GCS 15  EMS Vitals: 110 HR 95% RA 99/60 26 RR 30 CO2

## 2023-04-12 NOTE — ED Provider Notes (Signed)
Care assumed from [facility], PA-C at shift change pending CT abdomen. See her note for full HPI.  In short, patient is a 67 year old female who presents to the ED due to fever, chills, nausea, vomiting, and abdominal pain x 3 days.  Also admits to constipation and generalized weakness.  Physical Exam  BP 119/61   Pulse 88   Temp 98.1 F (36.7 C) (Oral)   Resp 17   Ht 5\' 8"  (1.727 m)   Wt 75.8 kg   SpO2 94%   BMI 25.39 kg/m   Physical Exam Vitals and nursing note reviewed.  Constitutional:      General: She is not in acute distress.    Appearance: She is not ill-appearing.  HENT:     Head: Normocephalic.  Eyes:     Pupils: Pupils are equal, round, and reactive to light.  Cardiovascular:     Rate and Rhythm: Normal rate and regular rhythm.     Pulses: Normal pulses.     Heart sounds: Normal heart sounds. No murmur heard.    No friction rub. No gallop.  Pulmonary:     Effort: Pulmonary effort is normal.     Breath sounds: Normal breath sounds.  Abdominal:     General: Abdomen is flat. There is no distension.     Palpations: Abdomen is soft.     Tenderness: There is abdominal tenderness. There is no guarding or rebound.  Musculoskeletal:        General: Normal range of motion.     Cervical back: Neck supple.  Skin:    General: Skin is warm and dry.  Neurological:     General: No focal deficit present.     Mental Status: She is alert.  Psychiatric:        Mood and Affect: Mood normal.        Behavior: Behavior normal.     Procedures  .Critical Care  Performed by: Mannie Stabile, PA-C Authorized by: Mannie Stabile, PA-C   Critical care provider statement:    Critical care time (minutes):  33   Critical care was necessary to treat or prevent imminent or life-threatening deterioration of the following conditions:  Sepsis and respiratory failure   Critical care was time spent personally by me on the following activities:  Development of treatment plan  with patient or surrogate, discussions with consultants, evaluation of patient's response to treatment, examination of patient, ordering and review of laboratory studies, ordering and review of radiographic studies, ordering and performing treatments and interventions, pulse oximetry, re-evaluation of patient's condition and review of old charts   ED Course / MDM   Clinical Course as of 04/12/23 1754  Tue Apr 12, 2023  1511 WBC(!): 13.9 [CA]  1754 Nitrite(!): POSITIVE [CA]  1754 Glori Luis): MODERATE [CA]  1754 Bacteria, UA(!): MANY [CA]    Clinical Course User Index [CA] Mannie Stabile, PA-C   Medical Decision Making Amount and/or Complexity of Data Reviewed Labs: ordered. Decision-making details documented in ED Course. Radiology: ordered and independent interpretation performed. Decision-making details documented in ED Course.  Risk OTC drugs. Prescription drug management. Decision regarding hospitalization.   4:58 PM Reassessed patient bedside.  Patient admits to fever, abdominal pain, numerous episodes of nonbloody, nonbilious emesis x 3 days.  Patient attributes her symptoms to being bit by 3 mosquitoes on Saturday.  Also endorses diffuse abdominal pain.  No diarrhea.  Also admits to a cough.  Awaiting CT abdomen and admission.  IV fluids  and antibiotics given by previous provider.  UA positive for nitrates and leukocytes and many bacteria consistent with acute cystitis which is likely causing patient's symptoms.  Awaiting CT abdomen.  Patient will need to be admitted to the hospital for further treatment.  COVID/influenza negative.  Elevated creatinine 1.27.  6:38 PM called to room due to sudden onset of shortness of breath after CT scan.  Patient admits to difficulties breathing and full body pruritus.  Patient had a CTA in December 2023 with no documented reaction.  Decreased air movement throughout.  Patient with auditory wheeze.  Possible allergic reaction to IV  contrast.  Epinephrine, Pepcid, Benadryl, and Solu-Medrol given.  Patient also has a history of COPD.  DuoNeb treatment given. Patient shivering in bed. Possible fever?? Tylenol given.  Last rectal temp 100.2 F.  Patient will require admission for acute cystitis. Awaiting CT abdomen results.   CT abdomen personally reviewed and interpreted which demonstrates: IMPRESSION:  1. Left renal and ureteral stranding with heterogeneous nephrogram  likely representing pyelonephritis. Focal hypoenhancing area in the  left renal parenchyma may indicate a developing parenchymal abscess.  Follow-up to resolution is recommended to exclude underlying focal  neoplasm.  2. Aortic atherosclerosis.    7:36 PM Discussed with Dr. Antionette Char with [facility] who agrees to admit patient.  Discussed with Dr. Rubin Payor who evaluated patient at bedside and agrees with assessment and plan.      Jesusita Oka 04/12/23 Wynelle Fanny, MD 04/12/23 2255

## 2023-04-12 NOTE — ED Notes (Signed)
RN attempted to draw 2nd set of blood cultures x2 unsuccessfully. RN request phlebotomy to draw 2nd set of blood cultures.

## 2023-04-12 NOTE — Progress Notes (Signed)
ED Pharmacy Antibiotic Sign Off An antibiotic consult was received from an ED provider for Cefepime and Vancomycin per pharmacy dosing for Sepsis. A chart review was completed to assess appropriateness.   The following one time order(s) were placed:  Cefepime 2g IV x1 Vancomycin 1,750 mg IV x1  Further antibiotic and/or antibiotic pharmacy consults should be ordered by the admitting provider if indicated.   Thank you for allowing pharmacy to be a part of this patient's care.   Caprice Beaver, Student-PharmD  04/12/23 4:18 PM

## 2023-04-12 NOTE — Sepsis Progress Note (Signed)
Code sepsis protocol being monitored by eLink. 

## 2023-04-12 NOTE — ED Notes (Signed)
Patient seen by Hospitalist.

## 2023-04-12 NOTE — ED Notes (Addendum)
Pt c/o difficulty breathing. Pt's RR labored. Pt's airway patent. Pt in acute distress. RN notified [place], [place] and brought [place], [place] to pt's bedside to assess pt.
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
## 2023-04-12 NOTE — ED Notes (Signed)
RN assisted pt in ambulating to bathroom. Pt had BM in bathroom. Unable to obtain urine sample at this time d/t stool contamination in urine sample. RN ambulated pt back to bed. Pt back in bed resting with call bell in reach.

## 2023-04-12 NOTE — ED Notes (Signed)
Pt was stuck and was unsuccessful.

## 2023-04-12 NOTE — H&P (Signed)
History and Physical    Lindsey [name] ZOX:[id number] DOB: 24-Nov-1955 DOA: 04/12/2023  PCP: Evelene Croon, MD   Patient coming from: Home   Chief Complaint: Abdominal pain, N/V, fever/chills   HPI: Lindsey [name] is a 67 y.o. female with medical history significant for polysubstance abuse in remission since 2019, asthma, hypertension, GERD, and seizure disorder presenting with abdominal pain, nausea, vomiting, fever, and chills.  Patient reports that she was in her usual state until 04/09/2023 when she developed dysuria, abdominal pain, nausea, vomiting, fever, and chills.  Symptoms have persisted and have been worsening some.  She denies any chest pain, shortness of breath, or cough.  ED Course: Upon arrival to the ED, patient is found to be afebrile and saturating mid 90s on room air with elevated heart rate and systolic blood pressure of 103 and greater.  Labs are most notable for WBC 13,900, creatinine 1.27, and normal lactic acid.  Bacteriuria, pyuria, and nitrites noted on UA.  CT demonstrates left renal and ureteral stranding with heterogenous nephrogram concerning for pyelonephritis as well as findings suggestive of possible developing left renal abscess.  Blood and urine cultures were collected and the patient was given 2.5 L of LR, cefepime, vancomycin, and Flagyl.  She had developed pruritus and acute shortness of breath with wheezing shortly after receiving contrast for the CT scan and this was treated with IM epinephrine, Pepcid, Benadryl, Solu-Medrol, and DuoNeb.  Review of Systems:  All other systems reviewed and apart from HPI, are negative.  Past Medical History:  Diagnosis Date   Arthritis    Asthma    Drug abuse (HCC)    recovered   GERD (gastroesophageal reflux disease)    H/O ETOH abuse    HTN (hypertension)    Seizure (HCC)     Past Surgical History:  Procedure Laterality Date   ABDOMINAL HYSTERECTOMY     BIOPSY  09/14/2022   Procedure: BIOPSY;   Surgeon: Meryl Dare, MD;  Location: [facility];  Service: Gastroenterology;;   COLONOSCOPY WITH PROPOFOL N/A 03/18/2022   Procedure: COLONOSCOPY WITH PROPOFOL;  Surgeon: Rachael Fee, MD;  Location: [facility];  Service: Gastroenterology;  Laterality: N/A;   ESOPHAGOGASTRODUODENOSCOPY (EGD) WITH PROPOFOL N/A 09/14/2022   Procedure: ESOPHAGOGASTRODUODENOSCOPY (EGD) WITH PROPOFOL;  Surgeon: Meryl Dare, MD;  Location: [facility];  Service: Gastroenterology;  Laterality: N/A;   GALLBLADDER SURGERY     peptic ulcer surgery     POLYPECTOMY  03/18/2022   Procedure: POLYPECTOMY;  Surgeon: Rachael Fee, MD;  Location: [facility];  Service: Gastroenterology;;   Gaspar Bidding DILATION N/A 09/14/2022   Procedure: Gaspar Bidding DILATION;  Surgeon: Meryl Dare, MD;  Location: [facility];  Service: Gastroenterology;  Laterality: N/A;    Social History:   reports that she has quit smoking. Her smoking use included cigarettes. She has never used smokeless tobacco. She reports that she does not currently use drugs after having used the following drugs: Marijuana. She reports that she does not drink alcohol.  Allergies  Allergen Reactions   Iodinated Contrast Media Shortness Of Breath and Itching    Acute pruritus and SOB with wheezing after CT contrast in ED 04/12/23   Kiwi Extract Hives and Itching   Morphine And Codeine Hives   Penicillins Hives and Other (See Comments)    Has patient had a PCN reaction causing immediate rash, facial/tongue/throat swelling, SOB or lightheadedness with hypotension: Yes Has patient had a PCN reaction causing severe rash involving mucus membranes or skin necrosis:  No Has patient had a PCN reaction that required hospitalization No Has patient had a PCN reaction occurring within the last 10 years: No If all of the above answers are "NO", then may proceed with Cephalosporin use.   Strawberry Extract Hives and Itching    Family History  Problem Relation  Age of Onset   Seizures Mother    Alcohol abuse Mother    Heart attack Father    Drug abuse Brother    Pancreatic cancer Brother    Cancer Daughter        type unknown   Diabetes Daughter    Hypertension Daughter      Prior to Admission medications   Medication Sig Start Date End Date Taking? Authorizing Provider  albuterol (VENTOLIN HFA) 108 (90 Base) MCG/ACT inhaler Inhale 2 puffs into the lungs 2 (two) times daily as needed for wheezing or shortness of breath.   Yes [provider]  divalproex (DEPAKOTE ER) 500 MG 24 hr tablet Take 500 mg by mouth in the morning and at bedtime.   Yes [provider]  hydrochlorothiazide (HYDRODIURIL) 12.5 MG tablet Take 12.5 mg by mouth daily.   Yes [provider]  levETIRAcetam (KEPPRA) 750 MG tablet Take 1 tablet (750 mg total) by mouth 2 (two) times daily. 07/20/19  Yes Raeford Razor, MD  losartan (COZAAR) 50 MG tablet Take 50 mg by mouth daily.   Yes [provider]  pantoprazole (PROTONIX) 40 MG tablet Take 1 tablet (40 mg total) by mouth daily. 09/15/22  Yes Almon Hercules, MD  rosuvastatin (CRESTOR) 10 MG tablet Take 10 mg by mouth daily.   Yes [provider]  senna-docusate (SENOKOT-S) 8.6-50 MG tablet Take 1 tablet by mouth daily as needed for mild constipation.   Yes [provider]  TYLENOL 8 HOUR ARTHRITIS PAIN 650 MG CR tablet Take 650-1,300 mg by mouth 2 (two) times daily.   Yes [provider]    Physical Exam: Vitals:   04/12/23 1815 04/12/23 1851 04/12/23 1853 04/12/23 1900  BP: 135/84 (!) 171/90  (!) 149/86  Pulse: 77 (!) 124  (!) 121  Resp: (!) 22 (!) 29  (!) 24  Temp:   97.9 F (36.6 C)   TempSrc:   Oral   SpO2: 93% 100%  100%  Weight:      Height:         Constitutional: NAD, calm  Eyes: PERTLA, lids and conjunctivae normal ENMT: Mucous membranes are moist. Posterior pharynx clear of any exudate or lesions.   Neck: supple, no masses  Respiratory: no  wheezing, no crackles. No accessory muscle use.  Cardiovascular: S1 & S2 heard, regular rate and rhythm. No JVD. Abdomen: Soft, no distension, tender on left without rebound pain or guarding. Bowel sounds active.  Musculoskeletal: no clubbing / cyanosis. No joint deformity upper and lower extremities.   Skin: Hypopigmented patches. Warm, dry, well-perfused. Neurologic: CN 2-12 grossly intact. Moving all extremities. Alert and oriented.  Psychiatric: Pleasant. Cooperative.    Labs and Imaging on Admission: I have personally reviewed following labs and imaging studies  CBC: Recent Labs  Lab 04/12/23 1230  WBC 13.9*  NEUTROABS 10.4*  HGB 14.0  HCT 41.7  MCV 89.9  PLT 313   Basic Metabolic Panel: Recent Labs  Lab 04/12/23 1230  NA 136  K 3.7  CL 101  CO2 22  GLUCOSE 109*  BUN 22  CREATININE 1.27*  CALCIUM 9.3   GFR: Estimated Creatinine Clearance:  44 mL/min (A) (by C-G formula based on SCr of 1.27 mg/dL (H)). Liver Function Tests: Recent Labs  Lab 04/12/23 1230  AST 27  ALT 16  ALKPHOS 100  BILITOT 0.5  PROT 8.0  ALBUMIN 3.5   No results for input(s): "LIPASE", "AMYLASE" in the last 168 hours. No results for input(s): "AMMONIA" in the last 168 hours. Coagulation Profile: Recent Labs  Lab 04/12/23 1230  INR 1.1   Cardiac Enzymes: No results for input(s): "CKTOTAL", "CKMB", "CKMBINDEX", "TROPONINI" in the last 168 hours. BNP (last 3 results) No results for input(s): "PROBNP" in the last 8760 hours. HbA1C: No results for input(s): "HGBA1C" in the last 72 hours. CBG: No results for input(s): "GLUCAP" in the last 168 hours. Lipid Profile: No results for input(s): "CHOL", "HDL", "LDLCALC", "TRIG", "CHOLHDL", "LDLDIRECT" in the last 72 hours. Thyroid Function Tests: No results for input(s): "TSH", "T4TOTAL", "FREET4", "T3FREE", "THYROIDAB" in the last 72 hours. Anemia Panel: No results for input(s): "VITAMINB12", "FOLATE", "FERRITIN", "TIBC", "IRON",  "RETICCTPCT" in the last 72 hours. Urine analysis:    Component Value Date/Time   COLORURINE YELLOW 04/12/2023 1630   APPEARANCEUR HAZY (A) 04/12/2023 1630   APPEARANCEUR Clear 01/28/2014 1937   LABSPEC 1.017 04/12/2023 1630   LABSPEC 1.015 01/28/2014 1937   PHURINE 6.0 04/12/2023 1630   GLUCOSEU NEGATIVE 04/12/2023 1630   GLUCOSEU Negative 01/28/2014 1937   HGBUR NEGATIVE 04/12/2023 1630   BILIRUBINUR NEGATIVE 04/12/2023 1630   BILIRUBINUR Negative 01/28/2014 1937   KETONESUR NEGATIVE 04/12/2023 1630   PROTEINUR NEGATIVE 04/12/2023 1630   UROBILINOGEN 0.2 03/01/2015 1935   NITRITE POSITIVE (A) 04/12/2023 1630   LEUKOCYTESUR MODERATE (A) 04/12/2023 1630   LEUKOCYTESUR Negative 01/28/2014 1937   Sepsis Labs: @LABRCNTIP (procalcitonin:4,lacticidven:4) ) Recent Results (from the past 240 hour(s))  Resp panel by RT-PCR (RSV, Flu A&B, Covid) Anterior Nasal Swab     Status: None   Collection Time: 04/12/23  3:33 PM   Specimen: Anterior Nasal Swab  Result Value Ref Range Status   SARS Coronavirus 2 by RT PCR NEGATIVE NEGATIVE Final   Influenza A by PCR NEGATIVE NEGATIVE Final   Influenza B by PCR NEGATIVE NEGATIVE Final    Comment: (NOTE) The Xpert Xpress SARS-CoV-2/FLU/RSV plus assay is intended as an aid in the diagnosis of influenza from Nasopharyngeal swab specimens and should not be used as a sole basis for treatment. Nasal washings and aspirates are unacceptable for Xpert Xpress SARS-CoV-2/FLU/RSV testing.  Fact Sheet for Patients: BloggerCourse.com  Fact Sheet for Healthcare Providers: SeriousBroker.it  This test is not yet approved or cleared by the [place] FDA and has been authorized for detection and/or diagnosis of SARS-CoV-2 by FDA under an Emergency Use Authorization (EUA). This EUA will remain in effect (meaning this test can be used) for the duration of the COVID-19 declaration under Section 564(b)(1) of  the Act, 21 U.S.C. section 360bbb-3(b)(1), unless the authorization is terminated or revoked.     Resp Syncytial Virus by PCR NEGATIVE NEGATIVE Final    Comment: (NOTE) Fact Sheet for Patients: BloggerCourse.com  Fact Sheet for Healthcare Providers: SeriousBroker.it  This test is not yet approved or cleared by the [place] FDA and has been authorized for detection and/or diagnosis of SARS-CoV-2 by FDA under an Emergency Use Authorization (EUA). This EUA will remain in effect (meaning this test can be used) for the duration of the COVID-19 declaration under Section 564(b)(1) of the Act, 21 U.S.C. section 360bbb-3(b)(1), unless the authorization is terminated or revoked.  Performed  at [facility], [address] [place], [place] [address]   Urine Culture     Status: None (Preliminary result)   Collection Time: 04/12/23  4:30 PM   Specimen: Urine, Clean Catch  Result Value Ref Range Status   Specimen Description URINE, CLEAN CATCH  Final   Special Requests   Final    NONE Reflexed from [id number] Performed at [facility], [address] [place], [place] [address]    Culture PENDING  Incomplete   Report Status PENDING  Incomplete     Radiological Exams on Admission: CT ABDOMEN PELVIS W CONTRAST  Result Date: 04/12/2023 CLINICAL DATA:  Acute nonlocalized abdominal pain EXAM: CT ABDOMEN AND PELVIS WITH CONTRAST TECHNIQUE: Multidetector CT imaging of the abdomen and pelvis was performed using the standard protocol following bolus administration of intravenous contrast. RADIATION DOSE REDUCTION: This exam was performed according to the departmental dose-optimization program which includes automated exposure control, adjustment of the mA and/or kV according to patient size and/or use of iterative reconstruction technique. CONTRAST:  60mL OMNIPAQUE IOHEXOL 350 MG/ML SOLN COMPARISON:  09/01/2021 FINDINGS: Lower chest: Lung  bases are clear. Hepatobiliary: No focal liver abnormality is seen. Status post cholecystectomy. No biliary dilatation. Pancreas: Unremarkable. No pancreatic ductal dilatation or surrounding inflammatory changes. Spleen: Normal in size without focal abnormality. Adrenals/Urinary Tract: No adrenal gland nodules. Multiple renal cysts bilaterally. Largest is in the right upper pole measuring 4.7 cm diameter. No imaging follow-up is indicated. There is no hydronephrosis but there is evidence of stranding around the left kidney, pelvis, and ureter. Parenchymal lesion in the left kidney measuring 3.1 cm diameter with hazy hypo enhanced appearance on the delayed images. Mild striation of the nephrogram. Changes likely indicate pyelonephritis with possible parenchymal abscess. Follow-up after resolution of acute process is recommended to exclude underlying focal mass. Bladder is normal. Stomach/Bowel: Stomach, small bowel, and colon are not abnormally distended. No wall thickening or inflammatory changes. Appendix is not identified. Vascular/Lymphatic: Aortic atherosclerosis. No enlarged abdominal or pelvic lymph nodes. Reproductive: Status post hysterectomy. No adnexal masses. Other: No abdominal wall hernia or abnormality. No abdominopelvic ascites. Musculoskeletal: No acute or significant osseous findings. IMPRESSION: 1. Left renal and ureteral stranding with heterogeneous nephrogram likely representing pyelonephritis. Focal hypoenhancing area in the left renal parenchyma may indicate a developing parenchymal abscess. Follow-up to resolution is recommended to exclude underlying focal neoplasm. 2. Aortic atherosclerosis. Electronically Signed   By: Burman Nieves M.D.   On: 04/12/2023 18:54   DG Chest Port 1 View  Result Date: 04/12/2023 CLINICAL DATA:  Fever.  Possible sepsis. EXAM: PORTABLE CHEST 1 VIEW COMPARISON:  01/10/2023 FINDINGS: Artifact overlies the chest. Heart size is normal. Chronic aortic  atherosclerosis. The lungs are clear. I think there is artificial opacity over the upper chest. Bilateral symmetric apical pneumonia seems unlikely. No pleural effusion. No acute bone finding. IMPRESSION: No active disease suspected. Artifact overlies the upper chest. Bilateral symmetric apical pneumonia seems unlikely. Electronically Signed   By: Paulina Fusi M.D.   On: 04/12/2023 13:47    EKG: Independently reviewed. Sinus tachycardia, rate 107.  Assessment/Plan  1. Severe sepsis secondary to pyelonephritis; ?developing renal abscess  - Treat with Rocephin, follow cultures and clinical course, repeat imaging if fails to improve with conservative management    2. AKI  - SCr is 1.27 on admission, up from baseline of ~0.6  - She has been fluid-resuscitated with 2.5 liters LR in ED  - Hold hydrochlorothiazide and losartan,  renally-dose medications, continue IVF hydration, and repeat chem panel in am   3. Seizure disorder  - Continue valproate and levetiracetam   4. Hypertension  - Hold hydrochlorothiazide and losartan as above, treat as-needed only for now   5. Asthma  - Not in exacerbation on admission   - Continue as-needed albuterol    DVT prophylaxis: Lovenox  Code Status: Full  Level of Care: Level of care: Progressive Family Communication: Daughter updated by phone at patient's request   Disposition Plan:  Patient is from: Home  Anticipated d/c is to: Home Anticipated d/c date is: 04/15/23  Patient currently: Pending resolution of sepsis, cultures, may need repeat imaging and/or urology consultation  Consults called: None  Admission status: Inpatient     Briscoe Deutscher, MD [facility]  04/12/2023, 7:34 PM

## 2023-04-12 NOTE — ED Provider Notes (Signed)
[place] EMERGENCY DEPARTMENT AT [facility] Provider Note   CSN: [id number] Arrival date & time: 04/12/23  1205     History  Chief Complaint  Patient presents with   Fever    Lindsey [name] is a 67 y.o. female with past medical history significant for ETOH abuse, polysubstance abuse, seizure, hypertension, asthma, homelessness, malnutrition presents to the ED via EMS complaining of fever, chills, nausea, vomiting and diffuse abdominal pain since Saturday.  Patient states she may also be constipated.  She has generalized weakness.  Patient's daughter gave her Tylenol at home, last dose 0600.  She reports she has been compliant with all of her medications.  Denies shortness of breath, chest pain, dizziness, light-headedness, syncope.        Home Medications Prior to Admission medications   Medication Sig Start Date End Date Taking? Authorizing Provider  albuterol (VENTOLIN HFA) 108 (90 Base) MCG/ACT inhaler Inhale 2 puffs into the lungs 2 (two) times daily as needed for wheezing or shortness of breath.    [provider]  ALOE VERA MOISTURIZING EX Apply 1 application  topically 2 (two) times daily as needed (to itchy areas of the scalp).    [provider]  BIOFREEZE 4 % GEL Apply 1 application  topically 3 (three) times daily as needed (to affected areas of the shoulders or other painful sites).    [provider]  cyclobenzaprine (FLEXERIL) 10 MG tablet Take 10 mg by mouth 2 (two) times daily as needed for muscle spasms.    [provider]  divalproex (DEPAKOTE ER) 500 MG 24 hr tablet Take 500 mg by mouth in the morning and at bedtime.    [provider]  levETIRAcetam (KEPPRA) 750 MG tablet Take 1 tablet (750 mg total) by mouth 2 (two) times daily. 07/20/19   Raeford Razor, MD  Nutritional Supplements (ENSURE PLUS HIGH PROTEIN) LIQD Take 237 mLs by mouth 2 (two) times daily.    [provider]  ondansetron (ZOFRAN) 4  MG tablet Take 1 tablet (4 mg total) by mouth every 8 (eight) hours as needed for nausea or vomiting. 09/15/22   Almon Hercules, MD  pantoprazole (PROTONIX) 40 MG tablet Take 1 tablet (40 mg total) by mouth daily. 09/15/22   Almon Hercules, MD  SALONPAS PAIN RELIEVING 4 % Place 2-3 patches onto the skin daily as needed (for back pain- after removal of former ones).    [provider]  Skin Protectants, Misc. (AQUAGARD HYDRATING) 41 % OINT Apply 1 application  topically See admin instructions. Apply to affected areas of the neck and scalp two to three times a day    [provider]  TYLENOL 8 HOUR ARTHRITIS PAIN 650 MG CR tablet Take 650-1,300 mg by mouth every 8 (eight) hours as needed for pain (or aches- when not using the 500 mg strength).    [provider]      Allergies    Kiwi extract, Morphine and codeine, Penicillins, and Strawberry extract    Review of Systems   Review of Systems  Constitutional:  Positive for fever.  Respiratory:  Negative for shortness of breath.   Cardiovascular:  Negative for chest pain.  Gastrointestinal:  Positive for abdominal pain, constipation, nausea and vomiting. Negative for diarrhea.  Neurological:  Positive for weakness. Negative for dizziness, syncope and light-headedness.    Physical Exam Updated Vital Signs BP 119/61   Pulse 88   Temp 100.2 F (37.9 C) (Rectal)  Resp 17   Ht 5\' 8"  (1.727 m)   Wt 75.8 kg   SpO2 94%   BMI 25.39 kg/m  Physical Exam Vitals and nursing note reviewed.  Constitutional:      General: She is not in acute distress.    Appearance: She is not ill-appearing.  HENT:     Mouth/Throat:     Mouth: Mucous membranes are moist.     Pharynx: Oropharynx is clear.  Cardiovascular:     Rate and Rhythm: Regular rhythm. Tachycardia present.     Pulses: Normal pulses.     Heart sounds: Normal heart sounds.  Pulmonary:     Effort: Pulmonary effort is normal. Tachypnea present. No respiratory  distress.     Breath sounds: Normal breath sounds and air entry.     Comments: Mild tachypnea. Abdominal:     General: Abdomen is flat. Bowel sounds are normal. There is no distension.     Palpations: Abdomen is soft.     Tenderness: There is generalized abdominal tenderness. There is no guarding or rebound.  Skin:    General: Skin is warm and dry.     Capillary Refill: Capillary refill takes less than 2 seconds.  Neurological:     Mental Status: She is alert. Mental status is at baseline.  Psychiatric:        Mood and Affect: Mood normal.        Behavior: Behavior normal.     ED Results / Procedures / Treatments   Labs (all labs ordered are listed, but only abnormal results are displayed) Labs Reviewed  COMPREHENSIVE METABOLIC PANEL - Abnormal; Notable for the following components:      Result Value   Glucose, Bld 109 (*)    Creatinine, Ser 1.27 (*)    GFR, Estimated 47 (*)    All other components within normal limits  CBC WITH DIFFERENTIAL/PLATELET - Abnormal; Notable for the following components:   WBC 13.9 (*)    Neutro Abs 10.4 (*)    Monocytes Absolute 1.4 (*)    All other components within normal limits  CULTURE, BLOOD (ROUTINE X 2)  CULTURE, BLOOD (ROUTINE X 2)  RESP PANEL BY RT-PCR (RSV, FLU A&B, COVID)  RVPGX2  PROTIME-INR  APTT  URINALYSIS, W/ REFLEX TO CULTURE (INFECTION SUSPECTED)  I-STAT CG4 LACTIC ACID, ED  I-STAT CG4 LACTIC ACID, ED    EKG EKG Interpretation Date/Time:  Tuesday April 12 2023 12:13:36 EDT Ventricular Rate:  107 PR Interval:  133 QRS Duration:  92 QT Interval:  332 QTC Calculation: 443 R Axis:   57  Text Interpretation: Sinus tachycardia Borderline T wave abnormalities Confirmed by Gloris Manchester [phone]) on 04/12/2023 2:50:16 PM  Radiology DG Chest Port 1 View  Result Date: 04/12/2023 CLINICAL DATA:  Fever.  Possible sepsis. EXAM: PORTABLE CHEST 1 VIEW COMPARISON:  01/10/2023 FINDINGS: Artifact overlies the chest. Heart size is normal.  Chronic aortic atherosclerosis. The lungs are clear. I think there is artificial opacity over the upper chest. Bilateral symmetric apical pneumonia seems unlikely. No pleural effusion. No acute bone finding. IMPRESSION: No active disease suspected. Artifact overlies the upper chest. Bilateral symmetric apical pneumonia seems unlikely. Electronically Signed   By: Paulina Fusi M.D.   On: 04/12/2023 13:47    Procedures Procedures    Medications Ordered in ED Medications  lactated ringers infusion (has no administration in time range)  lactated ringers bolus 1,000 mL (has no administration in time range)    And  lactated ringers  bolus 500 mL (has no administration in time range)  ceFEPIme (MAXIPIME) 2 g in sodium chloride 0.9 % 100 mL IVPB (has no administration in time range)  metroNIDAZOLE (FLAGYL) IVPB 500 mg (has no administration in time range)  vancomycin (VANCOCIN) IVPB 1000 mg/200 mL premix (has no administration in time range)  lactated ringers bolus 1,000 mL (0 mLs Intravenous Stopped 04/12/23 1426)  ondansetron (ZOFRAN) injection 4 mg (4 mg Intravenous Given 04/12/23 1254)  fentaNYL (SUBLIMAZE) injection 50 mcg (50 mcg Intravenous Given 04/12/23 1254)    ED Course/ Medical Decision Making/ A&P Clinical Course as of 04/12/23 1536  Tue Apr 12, 2023  1511 WBC(!): 13.9 [CA]    Clinical Course User Index [CA] Mannie Stabile, PA-C                             Medical Decision Making Amount and/or Complexity of Data Reviewed Labs: ordered. Radiology: ordered.  Risk Prescription drug management.   This patient presents to the ED with chief complaint(s) of nausea, vomiting, abdominal pain with pertinent past medical history of polysubstance abuse, hypertension, asthma, seizure, ETOH abuse, GERD.  The complaint involves an extensive differential diagnosis and also carries with it a high risk of complications and morbidity.    The differential diagnosis includes sepsis, colitis,  diverticulitis, acute gastroenteritis, UTI, pneumonia, metabolic derangement, COVID, influenza, other infectious etiology  The initial plan is to obtain sepsis workup  Additional history obtained: Additional history obtained from EMS  - report 110 HR, 95% on RA, 99/60 BP, 26 RR, 30 CO2  Initial Assessment:   Exam significant for ill-appearing patient who is not in acute respiratory distress.  She is mildly tachypneic.  Lungs are clear to auscultation bilaterally.  Abdomen is soft with generalized tenderness.  No guarding or rebound tenderness.  Skin is warm and dry.  Heart rate is tachycardic around 110 with regular rhythm.  Sinus on the monitor.   Independent ECG/labs interpretation:  The following labs were independently interpreted:  CBC with leukocytosis, no anemia.  Metabolic panel with elevated Cr, decreased GFR suggestive of AKI.  No electrolyte disturbance.  PT-INR, APTT unremarkable.  Initial lactic normal. Blood cultures were collected.   Independent visualization and interpretation of imaging: I independently visualized the following imaging with scope of interpretation limited to determining acute life threatening conditions related to emergency care: chest x-ray, which revealed no evidence of pneumonia or other acute cardiopulmonary process.   CT abdomen pelvis ordered.   Treatment and Reassessment: Patient treated with IV fluids, Zofran and fentanyl.  Will also treat with empiric antibiotics (unknown source of infection).  Patient meets SIRS criteria.    Disposition:   3:36 PM Care of transferred to [facility], PA-C at the end of my shift as the patient will require reassessment once labs/imaging have resulted. Patient presentation, ED course, and plan of care discussed with review of all pertinent labs and imaging. Please see his/her note for further details regarding further ED course and disposition. Plan at time of handoff is follow up on CT imaging and admit to  hospital for suspected sepsis. This may be altered or completely changed at the discretion of the oncoming team pending results of further workup.   Social Determinants of Health:   Patient's  polysubstance abuse, alcohol abuse   increases the complexity of managing their presentation         Final Clinical Impression(s) / ED Diagnoses Final diagnoses:  Sepsis, due to unspecified organism, unspecified whether acute organ dysfunction present [facility])    Rx / DC Orders ED Discharge Orders     None         Lenard Simmer, PA-C 04/12/23 1537    Gloris Manchester, MD 04/12/23 1706

## 2023-04-12 NOTE — ED Notes (Addendum)
Pt's breathing regular and unlabored at this time. Pt's airway patent. Pt in no acute distress at this time.

## 2023-04-13 ENCOUNTER — Other Ambulatory Visit: Payer: Self-pay

## 2023-04-13 DIAGNOSIS — N12 Tubulo-interstitial nephritis, not specified as acute or chronic: Secondary | ICD-10-CM | POA: Diagnosis not present

## 2023-04-13 LAB — BASIC METABOLIC PANEL
Anion gap: 10 (ref 5–15)
BUN: 18 mg/dL (ref 8–23)
CO2: 21 mmol/L — ABNORMAL LOW (ref 22–32)
Calcium: 8.6 mg/dL — ABNORMAL LOW (ref 8.9–10.3)
Chloride: 102 mmol/L (ref 98–111)
Creatinine, Ser: 1.1 mg/dL — ABNORMAL HIGH (ref 0.44–1.00)
GFR, Estimated: 55 mL/min — ABNORMAL LOW (ref >60)
Glucose, Bld: 170 mg/dL — ABNORMAL HIGH (ref 70–99)
Potassium: 3.4 mmol/L — ABNORMAL LOW (ref 3.5–5.1)
Sodium: 133 mmol/L — ABNORMAL LOW (ref 135–145)

## 2023-04-13 LAB — URINE CULTURE: Culture: <10000 — AB

## 2023-04-13 LAB — CBC
HCT: 33.3 % — ABNORMAL LOW (ref 36.0–46.0)
Hemoglobin: 11 g/dL — ABNORMAL LOW (ref 12.0–15.0)
MCH: 29.8 pg (ref 26.0–34.0)
MCHC: 33 g/dL (ref 30.0–36.0)
MCV: 90.2 fL (ref 80.0–100.0)
Platelets: 259 10*3/uL (ref 150–400)
RBC: 3.69 MIL/uL — ABNORMAL LOW (ref 3.87–5.11)
RDW: 13.7 % (ref 11.5–15.5)
WBC: 17.1 10*3/uL — ABNORMAL HIGH (ref 4.0–10.5)
nRBC: 0 % (ref 0.0–0.2)

## 2023-04-13 MED ORDER — HYDROMORPHONE HCL 2 MG PO TABS
1.0000 mg | ORAL_TABLET | ORAL | Status: DC | PRN
  Administered 2023-04-13 – 2023-04-17 (×10): 1 mg via ORAL
  Filled 2023-04-13 (×10): qty 1

## 2023-04-13 MED ORDER — POTASSIUM CHLORIDE CRYS ER 20 MEQ PO TBCR
40.0000 meq | EXTENDED_RELEASE_TABLET | Freq: Four times a day (QID) | ORAL | Status: AC
  Administered 2023-04-13 (×2): 40 meq via ORAL
  Filled 2023-04-13 (×2): qty 2

## 2023-04-13 MED ORDER — LACTATED RINGERS IV SOLN: INTRAVENOUS | Status: DC

## 2023-04-13 MED ORDER — SUCRALFATE 1 GM/10ML PO SUSP
1.0000 g | Freq: Three times a day (TID) | ORAL | Status: AC
  Administered 2023-04-13 – 2023-04-16 (×11): 1 g via ORAL
  Filled 2023-04-13 (×10): qty 10

## 2023-04-13 MED ORDER — DIPHENHYDRAMINE HCL 50 MG/ML IJ SOLN
12.5000 mg | Freq: Three times a day (TID) | INTRAMUSCULAR | Status: DC | PRN
  Administered 2023-04-13 – 2023-04-15 (×3): 12.5 mg via INTRAVENOUS
  Filled 2023-04-13 (×4): qty 1

## 2023-04-13 NOTE — Plan of Care (Signed)
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
## 2023-04-13 NOTE — Progress Notes (Addendum)
PROGRESS NOTE    Pennelope [name]  OZD:[id number] DOB: 10/26/1955 DOA: 04/12/2023 PCP: [facility]   Chief Complaint  Patient presents with   Fever    Brief Narrative:   Lindsey [name] is a 67 y.o. female with medical history significant for polysubstance abuse in remission since 2019, asthma, hypertension, GERD, and seizure disorder presenting with abdominal pain, nausea, vomiting, fever, and chills.   Patient reports that she was in her usual state until 04/09/2023 when she developed dysuria, abdominal pain, nausea, vomiting, fever, and chills.  Symptoms have persisted and have been worsening some.  She denies any chest pain, shortness of breath, or cough.   ED Course: Upon arrival to the ED, patient is found to be afebrile and saturating mid 90s on room air with elevated heart rate and systolic blood pressure of 103 and greater.  Labs are most notable for WBC 13,900, creatinine 1.27, and normal lactic acid.  Bacteriuria, pyuria, and nitrites noted on UA.  CT demonstrates left renal and ureteral stranding with heterogenous nephrogram concerning for pyelonephritis as well as findings suggestive of possible developing left renal abscess.   Blood and urine cultures were collected and the patient was given 2.5 L of LR, cefepime, vancomycin, and Flagyl.  She had developed pruritus and acute shortness of breath with wheezing shortly after receiving contrast for the CT scan and this was treated with IM epinephrine, Pepcid, Benadryl, Solu-Medrol, and DuoNeb.    Assessment & Plan:   Principal Problem:   Pyelonephritis Active Problems:   Seizure disorder (HCC)   Asthma, chronic   Essential hypertension   Severe sepsis (HCC)   AKI (acute kidney injury) (HCC)   Severe sepsis secondary to pyelonephritis; ?developing renal abscess  -Continue with IV Rocephin 2 g, follow urine cultures, blood cultures, she will likely need repeat imaging sure  improvement/resolution of fluid collection which is suspicious for abscess . -Continue with IV fluids   AKI  - SCr is 1.27 on admission, up from baseline of ~0.6  - She has been fluid-resuscitated with 2.5 liters LR in ED  - Hold hydrochlorothiazide and losartan, renally-dose medications, continue IVF hydration, and repeat chem panel in am  -Blood pressure remains soft, continue with IV fluids   Seizure disorder  - Continue valproate and levetiracetam    Hypertension  Hold hydrochlorothiazide and losartan as above, treat as-needed only for now  -Continue with IV fluids as blood pressure remains soft  Hypokalemia - Repleted, recheck in a.m.   Asthma  - Not in exacerbation on admission   - Continue as-needed albuterol     GERD -continue with PPI, will add Carafate given some active complaints of reflux   DVT prophylaxis: Lovenox Code Status: Full Family Communication: none at bedside Disposition:   Status is: Inpatient    Consultants:  none   Subjective:  Reports of fatigue, weakness  Objective: Vitals:   04/13/23 0500 04/13/23 0600 04/13/23 0800 04/13/23 1103  BP:   (!) 110/95 101/64  Pulse: 87 78 76 83  Resp:   18 16  Temp:   97.7 F (36.5 C) 97.7 F (36.5 C)  TempSrc:   Oral Axillary  SpO2: 95% 98% 96% 98%  Weight:      Height:        Intake/Output Summary (Last 24 hours) at 04/13/2023 1202 Last data filed at 04/13/2023 1000 Gross per 24 hour  Intake 1133.23 ml  Output --  Net 1133.23 ml   [employer]  04/12/23 1417  Weight: 75.8 kg    Examination:  Awake Alert, Oriented X 3, frail Symmetrical Chest wall movement, Good air movement bilaterally, CTAB RRR,No Gallops,Rubs or new Murmurs, No Parasternal Heave +ve B.Sounds, Abd Soft, left CVA tenderness No Cyanosis, Clubbing or edema, No new Rash or bruise       Data Reviewed: I have personally reviewed following labs and imaging studies  CBC: Recent Labs  Lab 04/12/23 1230  04/13/23 0532  WBC 13.9* 17.1*  NEUTROABS 10.4*  --   HGB 14.0 11.0*  HCT 41.7 33.3*  MCV 89.9 90.2  PLT 313 259    Basic Metabolic Panel: Recent Labs  Lab 04/12/23 1230 04/13/23 0532  NA 136 133*  K 3.7 3.4*  CL 101 102  CO2 22 21*  GLUCOSE 109* 170*  BUN 22 18  CREATININE 1.27* 1.10*  CALCIUM 9.3 8.6*    GFR: Estimated Creatinine Clearance: 50.7 mL/min (A) (by C-G formula based on SCr of 1.1 mg/dL (H)).  Liver Function Tests: Recent Labs  Lab 04/12/23 1230  AST 27  ALT 16  ALKPHOS 100  BILITOT 0.5  PROT 8.0  ALBUMIN 3.5    CBG: No results for input(s): "GLUCAP" in the last 168 hours.   Recent Results (from the past 240 hour(s))  Blood Culture (routine x 2)     Status: None (Preliminary result)   Collection Time: 04/12/23 12:30 PM   Specimen: BLOOD RIGHT HAND  Result Value Ref Range Status   Specimen Description BLOOD RIGHT HAND  Final   Special Requests   Final    BOTTLES DRAWN AEROBIC AND ANAEROBIC Blood Culture results may not be optimal due to an excessive volume of blood received in culture bottles   Culture   Final    NO GROWTH < 24 HOURS Performed at [facility], [address] [place], [place] [address]    Report Status PENDING  Incomplete  Blood Culture (routine x 2)     Status: None (Preliminary result)   Collection Time: 04/12/23  3:10 PM   Specimen: BLOOD LEFT ARM  Result Value Ref Range Status   Specimen Description BLOOD LEFT ARM  Final   Special Requests   Final    BOTTLES DRAWN AEROBIC AND ANAEROBIC Blood Culture results may not be optimal due to an excessive volume of blood received in culture bottles   Culture   Final    NO GROWTH < 24 HOURS Performed at [facility], [address], [place], [place] [address]    Report Status PENDING  Incomplete  Resp panel by RT-PCR (RSV, Flu A&B, Covid) Anterior Nasal Swab     Status: None   Collection Time: 04/12/23  3:33 PM   Specimen: Anterior Nasal Swab  Result Value Ref  Range Status   SARS Coronavirus 2 by RT PCR NEGATIVE NEGATIVE Final   Influenza A by PCR NEGATIVE NEGATIVE Final   Influenza B by PCR NEGATIVE NEGATIVE Final    Comment: (NOTE) The Xpert Xpress SARS-CoV-2/FLU/RSV plus assay is intended as an aid in the diagnosis of influenza from Nasopharyngeal swab specimens and should not be used as a sole basis for treatment. Nasal washings and aspirates are unacceptable for Xpert Xpress SARS-CoV-2/FLU/RSV testing.  Fact Sheet for Patients: BloggerCourse.com  Fact Sheet for Healthcare Providers: SeriousBroker.it  This test is not yet approved or cleared by the [place] FDA and has been authorized for detection and/or diagnosis of SARS-CoV-2 by FDA under an Emergency  Use Authorization (EUA). This EUA will remain in effect (meaning this test can be used) for the duration of the COVID-19 declaration under Section 564(b)(1) of the Act, 21 U.S.C. section 360bbb-3(b)(1), unless the authorization is terminated or revoked.     Resp Syncytial Virus by PCR NEGATIVE NEGATIVE Final    Comment: (NOTE) Fact Sheet for Patients: BloggerCourse.com  Fact Sheet for Healthcare Providers: SeriousBroker.it  This test is not yet approved or cleared by the [place] FDA and has been authorized for detection and/or diagnosis of SARS-CoV-2 by FDA under an Emergency Use Authorization (EUA). This EUA will remain in effect (meaning this test can be used) for the duration of the COVID-19 declaration under Section 564(b)(1) of the Act, 21 U.S.C. section 360bbb-3(b)(1), unless the authorization is terminated or revoked.  Performed at [facility], [address] [place], [place] [address]   Urine Culture     Status: None (Preliminary result)   Collection Time: 04/12/23  4:30 PM   Specimen: Urine, Clean Catch  Result Value Ref Range Status   Specimen  Description URINE, CLEAN CATCH  Final   Special Requests   Final    NONE Reflexed from [id number] Performed at [facility], [address], [place], [place] [address]    Culture PENDING  Incomplete   Report Status PENDING  Incomplete         Radiology Studies: CT ABDOMEN PELVIS W CONTRAST  Result Date: 04/12/2023 CLINICAL DATA:  Acute nonlocalized abdominal pain EXAM: CT ABDOMEN AND PELVIS WITH CONTRAST TECHNIQUE: Multidetector CT imaging of the abdomen and pelvis was performed using the standard protocol following bolus administration of intravenous contrast. RADIATION DOSE REDUCTION: This exam was performed according to the departmental dose-optimization program which includes automated exposure control, adjustment of the mA and/or kV according to patient size and/or use of iterative reconstruction technique. CONTRAST:  60mL OMNIPAQUE IOHEXOL 350 MG/ML SOLN COMPARISON:  09/01/2021 FINDINGS: Lower chest: Lung bases are clear. Hepatobiliary: No focal liver abnormality is seen. Status post cholecystectomy. No biliary dilatation. Pancreas: Unremarkable. No pancreatic ductal dilatation or surrounding inflammatory changes. Spleen: Normal in size without focal abnormality. Adrenals/Urinary Tract: No adrenal gland nodules. Multiple renal cysts bilaterally. Largest is in the right upper pole measuring 4.7 cm diameter. No imaging follow-up is indicated. There is no hydronephrosis but there is evidence of stranding around the left kidney, pelvis, and ureter. Parenchymal lesion in the left kidney measuring 3.1 cm diameter with hazy hypo enhanced appearance on the delayed images. Mild striation of the nephrogram. Changes likely indicate pyelonephritis with possible parenchymal abscess. Follow-up after resolution of acute process is recommended to exclude underlying focal mass. Bladder is normal. Stomach/Bowel: Stomach, small bowel, and colon are not abnormally distended. No wall thickening or inflammatory  changes. Appendix is not identified. Vascular/Lymphatic: Aortic atherosclerosis. No enlarged abdominal or pelvic lymph nodes. Reproductive: Status post hysterectomy. No adnexal masses. Other: No abdominal wall hernia or abnormality. No abdominopelvic ascites. Musculoskeletal: No acute or significant osseous findings. IMPRESSION: 1. Left renal and ureteral stranding with heterogeneous nephrogram likely representing pyelonephritis. Focal hypoenhancing area in the left renal parenchyma may indicate a developing parenchymal abscess. Follow-up to resolution is recommended to exclude underlying focal neoplasm. 2. Aortic atherosclerosis. Electronically Signed   By: Burman Nieves M.D.   On: 04/12/2023 18:54   DG Chest Port 1 View  Result Date: 04/12/2023 CLINICAL DATA:  Fever.  Possible sepsis. EXAM: PORTABLE CHEST 1 VIEW COMPARISON:  01/10/2023 FINDINGS: Artifact overlies the chest.  Heart size is normal. Chronic aortic atherosclerosis. The lungs are clear. I think there is artificial opacity over the upper chest. Bilateral symmetric apical pneumonia seems unlikely. No pleural effusion. No acute bone finding. IMPRESSION: No active disease suspected. Artifact overlies the upper chest. Bilateral symmetric apical pneumonia seems unlikely. Electronically Signed   By: Paulina Fusi M.D.   On: 04/12/2023 13:47        Scheduled Meds:  divalproex  500 mg Oral BID   enoxaparin (LOVENOX) injection  40 mg Subcutaneous Q24H   levETIRAcetam  750 mg Oral BID   pantoprazole  40 mg Oral Daily   rosuvastatin  10 mg Oral Daily   sodium chloride flush  3 mL Intravenous Q12H   Continuous Infusions:  cefTRIAXone (ROCEPHIN)  IV 200 mL/hr at 04/13/23 0636     LOS: 1 day      Huey Bienenstock, MD [facility]   To contact the attending provider between 7A-7P or the covering provider during after hours 7P-7A, please log into the web site [email or web address] and access using universal [place] password for that  web site. If you do not have the password, please call the hospital operator.  04/13/2023, 12:02 PM

## 2023-04-14 DIAGNOSIS — N12 Tubulo-interstitial nephritis, not specified as acute or chronic: Secondary | ICD-10-CM | POA: Diagnosis not present

## 2023-04-14 LAB — BASIC METABOLIC PANEL
Anion gap: 16 — ABNORMAL HIGH (ref 5–15)
BUN: 18 mg/dL (ref 8–23)
CO2: 18 mmol/L — ABNORMAL LOW (ref 22–32)
Calcium: 9.2 mg/dL (ref 8.9–10.3)
Chloride: 100 mmol/L (ref 98–111)
Creatinine, Ser: 0.74 mg/dL (ref 0.44–1.00)
GFR, Estimated: >60 mL/min (ref >60)
Glucose, Bld: 80 mg/dL (ref 70–99)
Potassium: 5.6 mmol/L — ABNORMAL HIGH (ref 3.5–5.1)
Sodium: 134 mmol/L — ABNORMAL LOW (ref 135–145)

## 2023-04-14 LAB — CBC WITH DIFFERENTIAL/PLATELET
Abs Immature Granulocytes: 0.03 10*3/uL (ref 0.00–0.07)
Basophils Absolute: 0 10*3/uL (ref 0.0–0.1)
Basophils Relative: 0 %
Eosinophils Absolute: 0.1 10*3/uL (ref 0.0–0.5)
Eosinophils Relative: 1 %
HCT: 34.8 % — ABNORMAL LOW (ref 36.0–46.0)
Hemoglobin: 11.2 g/dL — ABNORMAL LOW (ref 12.0–15.0)
Immature Granulocytes: 0 %
Lymphocytes Relative: 9 %
Lymphs Abs: 1.1 10*3/uL (ref 0.7–4.0)
MCH: 29.1 pg (ref 26.0–34.0)
MCHC: 32.2 g/dL (ref 30.0–36.0)
MCV: 90.4 fL (ref 80.0–100.0)
Monocytes Absolute: 0.6 10*3/uL (ref 0.1–1.0)
Monocytes Relative: 5 %
Neutro Abs: 10.8 10*3/uL — ABNORMAL HIGH (ref 1.7–7.7)
Neutrophils Relative %: 85 %
Platelets: 289 10*3/uL (ref 150–400)
RBC: 3.85 MIL/uL — ABNORMAL LOW (ref 3.87–5.11)
RDW: 13.9 % (ref 11.5–15.5)
WBC: 12.6 10*3/uL — ABNORMAL HIGH (ref 4.0–10.5)
nRBC: 0 % (ref 0.0–0.2)

## 2023-04-14 LAB — MAGNESIUM: Magnesium: 1.8 mg/dL (ref 1.7–2.4)

## 2023-04-14 LAB — PHOSPHORUS: Phosphorus: 2 mg/dL — ABNORMAL LOW (ref 2.5–4.6)

## 2023-04-14 LAB — POTASSIUM: Potassium: 4.2 mmol/L (ref 3.5–5.1)

## 2023-04-14 MED ORDER — SODIUM PHOSPHATES 45 MMOLE/15ML IV SOLN
30.0000 mmol | Freq: Once | INTRAVENOUS | Status: AC
  Administered 2023-04-14: 30 mmol via INTRAVENOUS
  Filled 2023-04-14: qty 10

## 2023-04-14 NOTE — Progress Notes (Addendum)
PROGRESS NOTE    Lindsey [name]  ZOX:[id number] DOB: Feb 10, 1956 DOA: 04/12/2023 PCP: [facility]   Chief Complaint  Patient presents with   Fever    Brief Narrative:   Lindsey [name] is a 67 y.o. female with medical history significant for polysubstance abuse in remission since 2019, asthma, hypertension, GERD, and seizure disorder presenting with abdominal pain, nausea, vomiting, fever, and chills. Workup significant for severe sepsis secondary to pyelonephritis with possible developing renal abscess.  Assessment & Plan:   Principal Problem:   Pyelonephritis Active Problems:   Seizure disorder (HCC)   Asthma, chronic   Essential hypertension   Severe sepsis (HCC)   AKI (acute kidney injury) (HCC)   Severe sepsis secondary to pyelonephritis; ?developing renal abscess  -Continue with IV Rocephin 2 g, follow urine cultures, blood cultures. -I have discussed with urology Dr. Cardell Peach, he reviewed imaging, at this point no indication for drain placement, recommendation treat with antibiotics x 14 days, IV during hospital stay, can be transitioned to oral on discharge to follow outpatient with Dr. Cardell Peach regarding  repeat imaging -Continue with IV fluids   AKI  - SCr is 1.27 on admission, up from baseline of ~0.6  - She has been fluid-resuscitated with 2.5 liters LR in ED  - Hold hydrochlorothiazide and losartan, renally-dose medications, continue IVF hydration, and repeat chem panel in am  -Blood pressure remains soft, continue with IV fluids   Seizure disorder  - Continue valproate and levetiracetam    Hypertension  Hold hydrochlorothiazide and losartan as above, treat as-needed only for now  -Continue with IV fluids as blood pressure remains soft  Hypokalemia - Repleted, recheck in a.m.   Asthma  - Not in exacerbation on admission   - Continue as-needed albuterol   Hyperkalemia -Hemolyzed , Repeat level within normal limit.     GERD -continue with PPI, will add Carafate given some active complaints of reflux   DVT prophylaxis: Lovenox Code Status: Full Family Communication: none at bedside Disposition:   Status is: Inpatient    Consultants:  none   Subjective:  Complaints of left flank pain.  Objective: Vitals:   04/13/23 2121 04/14/23 0102 04/14/23 0608 04/14/23 0900  BP: 111/62 126/77 137/67 121/65  Pulse: 78     Resp: 16 12 19    Temp: (!) 97.5 F (36.4 C) (!) 97.3 F (36.3 C) (!) 97.3 F (36.3 C) 98 F (36.7 C)  TempSrc: Oral Oral Oral Oral  SpO2:  96%  98%  Weight:      Height:        Intake/Output Summary (Last 24 hours) at 04/14/2023 1435 Last data filed at 04/13/2023 2200 Gross per 24 hour  Intake 49.94 ml  Output 600 ml  Net -550.06 ml   [employer]   04/12/23 1417  Weight: 75.8 kg    Examination:  Awake Alert, Oriented X 3, frail Symmetrical Chest wall movement, Good air movement bilaterally, CTAB RRR,No Gallops,Rubs or new Murmurs, No Parasternal Heave +ve B.Sounds, Abd Soft, left CVA tenderness No Cyanosis, Clubbing or edema, No new Rash or bruise        Data Reviewed: I have personally reviewed following labs and imaging studies  CBC: Recent Labs  Lab 04/12/23 1230 04/13/23 0532 04/14/23 0440  WBC 13.9* 17.1* 12.6*  NEUTROABS 10.4*  --  10.8*  HGB 14.0 11.0* 11.2*  HCT 41.7 33.3* 34.8*  MCV 89.9 90.2 90.4  PLT 313 259 289    Basic  Metabolic Panel: Recent Labs  Lab 04/12/23 1230 04/13/23 0532 04/14/23 0440 04/14/23 1034  NA 136 133* 134*  --   K 3.7 3.4* 5.6* 4.2  CL 101 102 100  --   CO2 22 21* 18*  --   GLUCOSE 109* 170* 80  --   BUN 22 18 18   --   CREATININE 1.27* 1.10* 0.74  --   CALCIUM 9.3 8.6* 9.2  --   MG  --   --  1.8  --   PHOS  --   --  2.0*  --     GFR: Estimated Creatinine Clearance: 69.8 mL/min (by C-G formula based on SCr of 0.74 mg/dL).  Liver Function Tests: Recent Labs  Lab 04/12/23 1230  AST 27  ALT 16   ALKPHOS 100  BILITOT 0.5  PROT 8.0  ALBUMIN 3.5    CBG: No results for input(s): "GLUCAP" in the last 168 hours.   Recent Results (from the past 240 hour(s))  Blood Culture (routine x 2)     Status: None (Preliminary result)   Collection Time: 04/12/23 12:30 PM   Specimen: BLOOD RIGHT HAND  Result Value Ref Range Status   Specimen Description BLOOD RIGHT HAND  Final   Special Requests   Final    BOTTLES DRAWN AEROBIC AND ANAEROBIC Blood Culture results may not be optimal due to an excessive volume of blood received in culture bottles   Culture   Final    NO GROWTH 2 DAYS Performed at [facility], [address] [place], [place] [address]    Report Status PENDING  Incomplete  Blood Culture (routine x 2)     Status: None (Preliminary result)   Collection Time: 04/12/23  3:10 PM   Specimen: BLOOD LEFT ARM  Result Value Ref Range Status   Specimen Description BLOOD LEFT ARM  Final   Special Requests   Final    BOTTLES DRAWN AEROBIC AND ANAEROBIC Blood Culture results may not be optimal due to an excessive volume of blood received in culture bottles   Culture   Final    NO GROWTH 2 DAYS Performed at [facility], [address], [place], [place] [address]    Report Status PENDING  Incomplete  Resp panel by RT-PCR (RSV, Flu A&B, Covid) Anterior Nasal Swab     Status: None   Collection Time: 04/12/23  3:33 PM   Specimen: Anterior Nasal Swab  Result Value Ref Range Status   SARS Coronavirus 2 by RT PCR NEGATIVE NEGATIVE Final   Influenza A by PCR NEGATIVE NEGATIVE Final   Influenza B by PCR NEGATIVE NEGATIVE Final    Comment: (NOTE) The Xpert Xpress SARS-CoV-2/FLU/RSV plus assay is intended as an aid in the diagnosis of influenza from Nasopharyngeal swab specimens and should not be used as a sole basis for treatment. Nasal washings and aspirates are unacceptable for Xpert Xpress SARS-CoV-2/FLU/RSV testing.  Fact Sheet for  Patients: BloggerCourse.com  Fact Sheet for Healthcare Providers: SeriousBroker.it  This test is not yet approved or cleared by the [place] FDA and has been authorized for detection and/or diagnosis of SARS-CoV-2 by FDA under an Emergency Use Authorization (EUA). This EUA will remain in effect (meaning this test can be used) for the duration of the COVID-19 declaration under Section 564(b)(1) of the Act, 21 U.S.C. section 360bbb-3(b)(1), unless the authorization is terminated or revoked.     Resp Syncytial Virus by PCR NEGATIVE NEGATIVE Final  Comment: (NOTE) Fact Sheet for Patients: BloggerCourse.com  Fact Sheet for Healthcare Providers: SeriousBroker.it  This test is not yet approved or cleared by the [place] FDA and has been authorized for detection and/or diagnosis of SARS-CoV-2 by FDA under an Emergency Use Authorization (EUA). This EUA will remain in effect (meaning this test can be used) for the duration of the COVID-19 declaration under Section 564(b)(1) of the Act, 21 U.S.C. section 360bbb-3(b)(1), unless the authorization is terminated or revoked.  Performed at [facility], [address] [place], [place] [address]   Urine Culture     Status: Abnormal   Collection Time: 04/12/23  4:30 PM   Specimen: Urine, Clean Catch  Result Value Ref Range Status   Specimen Description URINE, CLEAN CATCH  Final   Special Requests NONE Reflexed from [id number]  Final   Culture (A)  Final    <10,000 COLONIES/mL INSIGNIFICANT GROWTH Performed at [facility], [address], [place], [place] [address]    Report Status 04/13/2023 FINAL  Final         Radiology Studies: CT ABDOMEN PELVIS W CONTRAST  Result Date: 04/12/2023 CLINICAL DATA:  Acute nonlocalized abdominal pain EXAM: CT ABDOMEN AND PELVIS WITH CONTRAST TECHNIQUE: Multidetector CT imaging of  the abdomen and pelvis was performed using the standard protocol following bolus administration of intravenous contrast. RADIATION DOSE REDUCTION: This exam was performed according to the departmental dose-optimization program which includes automated exposure control, adjustment of the mA and/or kV according to patient size and/or use of iterative reconstruction technique. CONTRAST:  60mL OMNIPAQUE IOHEXOL 350 MG/ML SOLN COMPARISON:  09/01/2021 FINDINGS: Lower chest: Lung bases are clear. Hepatobiliary: No focal liver abnormality is seen. Status post cholecystectomy. No biliary dilatation. Pancreas: Unremarkable. No pancreatic ductal dilatation or surrounding inflammatory changes. Spleen: Normal in size without focal abnormality. Adrenals/Urinary Tract: No adrenal gland nodules. Multiple renal cysts bilaterally. Largest is in the right upper pole measuring 4.7 cm diameter. No imaging follow-up is indicated. There is no hydronephrosis but there is evidence of stranding around the left kidney, pelvis, and ureter. Parenchymal lesion in the left kidney measuring 3.1 cm diameter with hazy hypo enhanced appearance on the delayed images. Mild striation of the nephrogram. Changes likely indicate pyelonephritis with possible parenchymal abscess. Follow-up after resolution of acute process is recommended to exclude underlying focal mass. Bladder is normal. Stomach/Bowel: Stomach, small bowel, and colon are not abnormally distended. No wall thickening or inflammatory changes. Appendix is not identified. Vascular/Lymphatic: Aortic atherosclerosis. No enlarged abdominal or pelvic lymph nodes. Reproductive: Status post hysterectomy. No adnexal masses. Other: No abdominal wall hernia or abnormality. No abdominopelvic ascites. Musculoskeletal: No acute or significant osseous findings. IMPRESSION: 1. Left renal and ureteral stranding with heterogeneous nephrogram likely representing pyelonephritis. Focal hypoenhancing area in the  left renal parenchyma may indicate a developing parenchymal abscess. Follow-up to resolution is recommended to exclude underlying focal neoplasm. 2. Aortic atherosclerosis. Electronically Signed   By: Burman Nieves M.D.   On: 04/12/2023 18:54        Scheduled Meds:  divalproex  500 mg Oral BID   enoxaparin (LOVENOX) injection  40 mg Subcutaneous Q24H   levETIRAcetam  750 mg Oral BID   pantoprazole  40 mg Oral Daily   rosuvastatin  10 mg Oral Daily   sodium chloride flush  3 mL Intravenous Q12H   sucralfate  1 g Oral TID WC & HS   Continuous Infusions:  cefTRIAXone (ROCEPHIN)  IV 2 g (04/14/23  1610)   lactated ringers 50 mL/hr at 04/13/23 1748   sodium phosphate 30 mmol in dextrose 5 % 250 mL infusion       LOS: 2 days      Huey Bienenstock, MD [facility]   To contact the attending provider between 7A-7P or the covering provider during after hours 7P-7A, please log into the web site [email or web address] and access using universal [place] password for that web site. If you do not have the password, please call the hospital operator.  04/14/2023, 2:35 PM

## 2023-04-14 NOTE — Progress Notes (Signed)
Ultrasound to entire Rt arm lateral and posterior with no vein suitable for cannulation for IV site. Veins are extremely tiny and deep. Able to place # 20, 1.88" Lt upper FA - nothing else visualized on Lt arm. RN made aware that this is the last IV site for patient.
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
## 2023-04-14 NOTE — Plan of Care (Signed)

## 2023-04-14 NOTE — Care Management Important Message (Signed)
Important Message  Patient Details  Name: Lindsey [name] MRN: [id number] Date of Birth: [birth year]   Medicare Important Message Given:  Yes     Shyla Gayheart Stefan Church 04/14/2023, 3:07 PM

## 2023-04-15 DIAGNOSIS — N12 Tubulo-interstitial nephritis, not specified as acute or chronic: Secondary | ICD-10-CM | POA: Diagnosis not present

## 2023-04-15 LAB — BASIC METABOLIC PANEL
Anion gap: 7 (ref 5–15)
BUN: 12 mg/dL (ref 8–23)
CO2: 25 mmol/L (ref 22–32)
Calcium: 8.8 mg/dL — ABNORMAL LOW (ref 8.9–10.3)
Chloride: 106 mmol/L (ref 98–111)
Creatinine, Ser: 0.63 mg/dL (ref 0.44–1.00)
GFR, Estimated: >60 mL/min (ref >60)
Glucose, Bld: 97 mg/dL (ref 70–99)
Potassium: 4 mmol/L (ref 3.5–5.1)
Sodium: 138 mmol/L (ref 135–145)

## 2023-04-15 LAB — CBC
HCT: 30 % — ABNORMAL LOW (ref 36.0–46.0)
Hemoglobin: 9.9 g/dL — ABNORMAL LOW (ref 12.0–15.0)
MCH: 28.8 pg (ref 26.0–34.0)
MCHC: 33 g/dL (ref 30.0–36.0)
MCV: 87.2 fL (ref 80.0–100.0)
Platelets: 330 10*3/uL (ref 150–400)
RBC: 3.44 MIL/uL — ABNORMAL LOW (ref 3.87–5.11)
RDW: 14 % (ref 11.5–15.5)
WBC: 7.6 10*3/uL (ref 4.0–10.5)
nRBC: 0 % (ref 0.0–0.2)

## 2023-04-15 LAB — CULTURE, BLOOD (ROUTINE X 2): Culture: NO GROWTH

## 2023-04-15 MED ORDER — DIPHENHYDRAMINE HCL 12.5 MG/5ML PO ELIX
12.5000 mg | ORAL_SOLUTION | Freq: Three times a day (TID) | ORAL | Status: DC | PRN
  Administered 2023-04-17 – 2023-04-18 (×2): 12.5 mg via ORAL
  Filled 2023-04-15 (×5): qty 5
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
## 2023-04-15 NOTE — Plan of Care (Signed)

## 2023-04-15 NOTE — Progress Notes (Signed)
PHARMACIST - PHYSICIAN COMMUNICATION  DR:   Elgergawy  CONCERNING: IV to Oral Route Change Policy  RECOMMENDATION: This patient is receiving benadryl by the intravenous route.  Based on criteria approved by the Pharmacy and Therapeutics Committee, the intravenous medication(s) is/are being converted to the equivalent oral dose form(s).   DESCRIPTION: These criteria include: The patient is eating (either orally or via tube) and/or has been taking other orally administered medications for a least 24 hours The patient has no evidence of active gastrointestinal bleeding or impaired GI absorption (gastrectomy, short bowel, patient on TNA or NPO).  If you have questions about this conversion, please contact the Pharmacy Department  []   [phone] )  [facility] []   [phone] )  [facility] [x]   [phone] )  [facility] []   [phone] )  [facility] []   [phone] )  [facility]

## 2023-04-15 NOTE — Progress Notes (Signed)
PROGRESS NOTE    Lindsey [name]  ZOX:[id number] DOB: 10/11/1955 DOA: 04/12/2023 [facility]CP: [facility]   Chief Complaint  Patient presents with   Fever   Brief Narrative:   Lindsey [name] is a 67 y.o. female with medical history significant for polysubstance abuse in remission since 2019, asthma, hypertension, GERD, and seizure disorder presenting with abdominal pain, nausea, vomiting, fever, and chills. Workup significant for severe sepsis secondary to pyelonephritis with possible developing renal abscess.  The patient continues to complain of severe pain. She is afebrile and WBC is decreasing. Urine cultures have had insignificant growth.   Status is: Inpatient   Consultants:  none  Subjective:  She is awake, alert, and oriented x 3. She continues to complain of left flank pain.  Objective: Vitals:   04/15/23 1200 04/15/23 1314 04/15/23 1535 04/15/23 1600  BP: (!) 131/101  132/80 (!) 134/111  Pulse: 77 78 73 80  Resp: (!) 24 (!) 22 16 19   Temp: 97.9 F (36.6 C)  98.1 F (36.7 C)   TempSrc: Oral  Oral   SpO2: 95% 99% 98% 99%  Weight:      Height:        Intake/Output Summary (Last 24 hours) at 04/15/2023 1817 Last data filed at 04/15/2023 1500 Gross per 24 hour  Intake 1631.51 ml  Output 450 ml  Net 1181.51 ml   Filed Weights   04/12/23 1417  Weight: 75.8 kg    Exam:  Constitutional:  The patient is awake, alert, and oriented x 3. Mild distress from left flank pain. Respiratory:  No increased work of breathing. No wheezes, rales, or rhonchi No tactile fremitus Cardiovascular:  Regular rate and rhythm No murmurs, ectopy, or gallups. No lateral PMI. No thrills. Abdomen:  Abdomen is soft, non-tender, non-distended No hernias, masses, or organomegaly Normoactive bowel sounds.  Musculoskeletal:  No cyanosis, clubbing, or edema Positive CA tenderness. Skin:  No rashes, lesions, ulcers palpation of skin: no induration or  nodules Neurologic:  CN 2-12 intact Sensation all 4 extremities intact Psychiatric:  Mental status Mood, affect appropriate Orientation to person, place, time  judgment and insight appear intact  Data Reviewed: I have personally reviewed following labs and imaging studies  CBC: Recent Labs  Lab 04/12/23 1230 04/13/23 0532 04/14/23 0440 04/15/23 0047  WBC 13.9* 17.1* 12.6* 7.6  NEUTROABS 10.4*  --  10.8*  --   HGB 14.0 11.0* 11.2* 9.9*  HCT 41.7 33.3* 34.8* 30.0*  MCV 89.9 90.2 90.4 87.2  PLT 313 259 289 330    Basic Metabolic Panel: Recent Labs  Lab 04/12/23 1230 04/13/23 0532 04/14/23 0440 04/14/23 1034 04/15/23 0047  NA 136 133* 134*  --  138  K 3.7 3.4* 5.6* 4.2 4.0  CL 101 102 100  --  106  CO2 22 21* 18*  --  25  GLUCOSE 109* 170* 80  --  97  BUN 22 18 18   --  12  CREATININE 1.27* 1.10* 0.74  --  0.63  CALCIUM 9.3 8.6* 9.2  --  8.8*  MG  --   --  1.8  --   --   PHOS  --   --  2.0*  --   --     GFR: Estimated Creatinine Clearance: 69.8 mL/min (by C-G formula based on SCr of 0.63 mg/dL).  Liver Function Tests: Recent Labs  Lab 04/12/23 1230  AST 27  ALT 16  ALKPHOS 100  BILITOT 0.5  PROT  8.0  ALBUMIN 3.5    CBG: No results for input(s): "GLUCAP" in the last 168 hours.   Recent Results (from the past 240 hour(s))  Blood Culture (routine x 2)     Status: None (Preliminary result)   Collection Time: 04/12/23 12:30 PM   Specimen: BLOOD RIGHT HAND  Result Value Ref Range Status   Specimen Description BLOOD RIGHT HAND  Final   Special Requests   Final    BOTTLES DRAWN AEROBIC AND ANAEROBIC Blood Culture results may not be optimal due to an excessive volume of blood received in culture bottles   Culture   Final    NO GROWTH 3 DAYS Performed at [facility], [address], [place], [place] [address]    Report Status PENDING  Incomplete  Blood Culture (routine x 2)     Status: None (Preliminary result)   Collection Time: 04/12/23  3:10  PM   Specimen: BLOOD LEFT ARM  Result Value Ref Range Status   Specimen Description BLOOD LEFT ARM  Final   Special Requests   Final    BOTTLES DRAWN AEROBIC AND ANAEROBIC Blood Culture results may not be optimal due to an excessive volume of blood received in culture bottles   Culture   Final    NO GROWTH 3 DAYS Performed at [facility], [address] [place], [place] [address]    Report Status PENDING  Incomplete  Resp panel by RT-PCR (RSV, Flu A&B, Covid) Anterior Nasal Swab     Status: None   Collection Time: 04/12/23  3:33 PM   Specimen: Anterior Nasal Swab  Result Value Ref Range Status   SARS Coronavirus 2 by RT PCR NEGATIVE NEGATIVE Final   Influenza A by PCR NEGATIVE NEGATIVE Final   Influenza B by PCR NEGATIVE NEGATIVE Final    Comment: (NOTE) The Xpert Xpress SARS-CoV-2/FLU/RSV plus assay is intended as an aid in the diagnosis of influenza from Nasopharyngeal swab specimens and should not be used as a sole basis for treatment. Nasal washings and aspirates are unacceptable for Xpert Xpress SARS-CoV-2/FLU/RSV testing.  Fact Sheet for Patients: BloggerCourse.com  Fact Sheet for Healthcare Providers: SeriousBroker.it  This test is not yet approved or cleared by the [place] FDA and has been authorized for detection and/or diagnosis of SARS-CoV-2 by FDA under an Emergency Use Authorization (EUA). This EUA will remain in effect (meaning this test can be used) for the duration of the COVID-19 declaration under Section 564(b)(1) of the Act, 21 U.S.C. section 360bbb-3(b)(1), unless the authorization is terminated or revoked.     Resp Syncytial Virus by PCR NEGATIVE NEGATIVE Final    Comment: (NOTE) Fact Sheet for Patients: BloggerCourse.com  Fact Sheet for Healthcare Providers: SeriousBroker.it  This test is not yet approved or cleared by the [place]  FDA and has been authorized for detection and/or diagnosis of SARS-CoV-2 by FDA under an Emergency Use Authorization (EUA). This EUA will remain in effect (meaning this test can be used) for the duration of the COVID-19 declaration under Section 564(b)(1) of the Act, 21 U.S.C. section 360bbb-3(b)(1), unless the authorization is terminated or revoked.  Performed at [facility], [address] [place], [place] [address]   Urine Culture     Status: Abnormal   Collection Time: 04/12/23  4:30 PM   Specimen: Urine, Clean Catch  Result Value Ref Range Status   Specimen Description URINE, CLEAN CATCH  Final   Special Requests NONE Reflexed from  [id number]  Final   Culture (A)  Final    <10,000 COLONIES/mL INSIGNIFICANT GROWTH Performed at [facility], [address] [place], [place] [address]    Report Status 04/13/2023 FINAL  Final    Radiology Studies: No results found.  Scheduled Meds:  divalproex  500 mg Oral BID   enoxaparin (LOVENOX) injection  40 mg Subcutaneous Q24H   levETIRAcetam  750 mg Oral BID   pantoprazole  40 mg Oral Daily   rosuvastatin  10 mg Oral Daily   sodium chloride flush  3 mL Intravenous Q12H   sucralfate  1 g Oral TID WC & HS   Continuous Infusions:  cefTRIAXone (ROCEPHIN)  IV 2 g (04/15/23 0653)   lactated ringers 50 mL/hr at 04/13/23 1748   Assessment & Plan:   Principal Problem:   Pyelonephritis Active Problems:   Seizure disorder (HCC)   Asthma, chronic   Essential hypertension   Severe sepsis (HCC)   AKI (acute kidney injury) (HCC)  Severe sepsis secondary to pyelonephritis; ?developing renal abscess  -Sepsis resolved. -Continue with IV Rocephin 2 g. -Urine culture has had insignificant growth -Blood cultures x 2 have had no growth. -The patient has been discussed with urology Dr. Cardell Peach, he reviewed imaging, at this point no indication for drain placement, recommendation treat with antibiotics x 14 days, IV during hospital stay,  can be transitioned to oral on discharge to follow outpatient with Dr. Cardell Peach regarding  repeat imaging -Continue with IV fluids   AKI  - SCr is 1.27 on admission, up from baseline of ~0.6  -It has resolved to 0.63 - She has been fluid-resuscitated with 2.5 liters LR in ED - Hold hydrochlorothiazide and losartan, renally-dose medications, continue IVF hydration, and repeat chem panel in am  -Blood pressures have normalized. - Continue with IV fluids due to pyelonephritis   Seizure disorder  - Continue valproate and levetiracetam    Hypertension  Hold hydrochlorothiazide and losartan as above, treat as-needed only for now  -Blood pressures have normalized.  Hypokalemia - Repleted, continue to monitor.   Asthma  - Not in exacerbation on admission   - Continue as-needed albuterol   GERD -continue with PPI, will add Carafate given some active complaints of reflux  I have seen and examined this patient myself. I have spent 34 minutes in her evaluation and care.  DVT prophylaxis: Lovenox Code Status: Full Family Communication: none at bedside Disposition:    LOS: 3 days   Muriel Wilber, MD [facility]   To contact the attending provider between 7A-7P or the covering provider during after hours 7P-7A, please log into the web site [email or web address] and access using universal  password for that web site. If you do not have the password, please call the hospital operator.  04/15/2023, 6:17 PM

## 2023-04-16 DIAGNOSIS — N12 Tubulo-interstitial nephritis, not specified as acute or chronic: Secondary | ICD-10-CM | POA: Diagnosis not present

## 2023-04-16 LAB — CBC
HCT: 33.2 % — ABNORMAL LOW (ref 36.0–46.0)
Hemoglobin: 11 g/dL — ABNORMAL LOW (ref 12.0–15.0)
MCH: 28.8 pg (ref 26.0–34.0)
MCHC: 33.1 g/dL (ref 30.0–36.0)
MCV: 86.9 fL (ref 80.0–100.0)
Platelets: 369 K/uL (ref 150–400)
RBC: 3.82 MIL/uL — ABNORMAL LOW (ref 3.87–5.11)
RDW: 13.8 % (ref 11.5–15.5)
WBC: 5.1 K/uL (ref 4.0–10.5)
nRBC: 0 % (ref 0.0–0.2)

## 2023-04-16 LAB — BASIC METABOLIC PANEL
Anion gap: 10 (ref 5–15)
BUN: 6 mg/dL — ABNORMAL LOW (ref 8–23)
CO2: 25 mmol/L (ref 22–32)
Calcium: 8.9 mg/dL (ref 8.9–10.3)
Chloride: 102 mmol/L (ref 98–111)
Creatinine, Ser: 0.66 mg/dL (ref 0.44–1.00)
GFR, Estimated: >60 mL/min (ref >60)
Glucose, Bld: 91 mg/dL (ref 70–99)
Potassium: 3.7 mmol/L (ref 3.5–5.1)
Sodium: 137 mmol/L (ref 135–145)

## 2023-04-16 LAB — CULTURE, BLOOD (ROUTINE X 2)

## 2023-04-16 NOTE — Plan of Care (Signed)

## 2023-04-16 NOTE — Progress Notes (Signed)
PROGRESS NOTE    Lindsey [name]  QMV:[id number] DOB: [birth year] DOA: 04/12/2023 PCP: [facility]   Chief Complaint  Patient presents with   Fever   Brief Narrative:   Lindsey [name] is a 67 y.o. female with medical history significant for polysubstance abuse in remission since 2019, asthma, hypertension, GERD, and seizure disorder presenting with abdominal pain, nausea, vomiting, fever, and chills. Workup significant for severe sepsis secondary to pyelonephritis with possible developing renal abscess.  The patient continues to complain of severe pain. She is afebrile and WBC is decreasing. Urine cultures have had insignificant growth.   Status is: Inpatient   Consultants:  none  Subjective:  She is awake, alert, and oriented x 3. She continues to complain of left flank pain.  Objective: Vitals:   04/16/23 0012 04/16/23 0917 04/16/23 1201 04/16/23 1557  BP: (!) 147/79  105/74   Pulse: 84     Resp: 15     Temp: 97.9 F (36.6 C)   98.1 F (36.7 C)  TempSrc: Oral Oral Oral Oral  SpO2: 100%     Weight:      Height:       No intake or output data in the 24 hours ending 04/16/23 1824  Filed Weights   04/12/23 1417  Weight: 75.8 kg    Exam:  Constitutional:  The patient is awake, alert, and oriented x 3. Mild distress from left flank pain. Respiratory:  No increased work of breathing. No wheezes, rales, or rhonchi No tactile fremitus Cardiovascular:  Regular rate and rhythm No murmurs, ectopy, or gallups. No lateral PMI. No thrills. Abdomen:  Abdomen is soft, non-tender, non-distended No hernias, masses, or organomegaly Normoactive bowel sounds.  Musculoskeletal:  No cyanosis, clubbing, or edema Positive CA tenderness. Skin:  No rashes, lesions, ulcers palpation of skin: no induration or nodules Neurologic:  CN 2-12 intact Sensation all 4 extremities intact Psychiatric:  Mental status Mood, affect  appropriate Orientation to person, place, time  judgment and insight appear intact  Data Reviewed: I have personally reviewed following labs and imaging studies  CBC: Recent Labs  Lab 04/12/23 1230 04/13/23 0532 04/14/23 0440 04/15/23 0047 04/16/23 0345  WBC 13.9* 17.1* 12.6* 7.6 5.1  NEUTROABS 10.4*  --  10.8*  --   --   HGB 14.0 11.0* 11.2* 9.9* 11.0*  HCT 41.7 33.3* 34.8* 30.0* 33.2*  MCV 89.9 90.2 90.4 87.2 86.9  PLT 313 259 289 330 369    Basic Metabolic Panel: Recent Labs  Lab 04/12/23 1230 04/13/23 0532 04/14/23 0440 04/14/23 1034 04/15/23 0047 04/16/23 0345  NA 136 133* 134*  --  138 137  K 3.7 3.4* 5.6* 4.2 4.0 3.7  CL 101 102 100  --  106 102  CO2 22 21* 18*  --  25 25  GLUCOSE 109* 170* 80  --  97 91  BUN 22 18 18   --  12 6*  CREATININE 1.27* 1.10* 0.74  --  0.63 0.66  CALCIUM 9.3 8.6* 9.2  --  8.8* 8.9  MG  --   --  1.8  --   --   --   PHOS  --   --  2.0*  --   --   --     GFR: Estimated Creatinine Clearance: 69.8 mL/min (by C-G formula based on SCr of 0.66 mg/dL).  Liver Function Tests: Recent Labs  Lab 04/12/23 1230  AST 27  ALT 16  ALKPHOS 100  BILITOT  0.5  PROT 8.0  ALBUMIN 3.5    CBG: No results for input(s): "GLUCAP" in the last 168 hours.   Recent Results (from the past 240 hour(s))  Blood Culture (routine x 2)     Status: None (Preliminary result)   Collection Time: 04/12/23 12:30 PM   Specimen: BLOOD RIGHT HAND  Result Value Ref Range Status   Specimen Description BLOOD RIGHT HAND  Final   Special Requests   Final    BOTTLES DRAWN AEROBIC AND ANAEROBIC Blood Culture results may not be optimal due to an excessive volume of blood received in culture bottles   Culture   Final    NO GROWTH 4 DAYS Performed at [facility], [address], [place], [place] [address]    Report Status PENDING  Incomplete  Blood Culture (routine x 2)     Status: None (Preliminary result)   Collection Time: 04/12/23  3:10 PM   Specimen:  BLOOD LEFT ARM  Result Value Ref Range Status   Specimen Description BLOOD LEFT ARM  Final   Special Requests   Final    BOTTLES DRAWN AEROBIC AND ANAEROBIC Blood Culture results may not be optimal due to an excessive volume of blood received in culture bottles   Culture   Final    NO GROWTH 4 DAYS Performed at [facility], [address], [place], [place] [address]    Report Status PENDING  Incomplete  Resp panel by RT-PCR (RSV, Flu A&B, Covid) Anterior Nasal Swab     Status: None   Collection Time: 04/12/23  3:33 PM   Specimen: Anterior Nasal Swab  Result Value Ref Range Status   SARS Coronavirus 2 by RT PCR NEGATIVE NEGATIVE Final   Influenza A by PCR NEGATIVE NEGATIVE Final   Influenza B by PCR NEGATIVE NEGATIVE Final    Comment: (NOTE) The Xpert Xpress SARS-CoV-2/FLU/RSV plus assay is intended as an aid in the diagnosis of influenza from Nasopharyngeal swab specimens and should not be used as a sole basis for treatment. Nasal washings and aspirates are unacceptable for Xpert Xpress SARS-CoV-2/FLU/RSV testing.  Fact Sheet for Patients: BloggerCourse.com  Fact Sheet for Healthcare Providers: SeriousBroker.it  This test is not yet approved or cleared by the [place] FDA and has been authorized for detection and/or diagnosis of SARS-CoV-2 by FDA under an Emergency Use Authorization (EUA). This EUA will remain in effect (meaning this test can be used) for the duration of the COVID-19 declaration under Section 564(b)(1) of the Act, 21 U.S.C. section 360bbb-3(b)(1), unless the authorization is terminated or revoked.     Resp Syncytial Virus by PCR NEGATIVE NEGATIVE Final    Comment: (NOTE) Fact Sheet for Patients: BloggerCourse.com  Fact Sheet for Healthcare Providers: SeriousBroker.it  This test is not yet approved or cleared by the [place] FDA and has  been authorized for detection and/or diagnosis of SARS-CoV-2 by FDA under an Emergency Use Authorization (EUA). This EUA will remain in effect (meaning this test can be used) for the duration of the COVID-19 declaration under Section 564(b)(1) of the Act, 21 U.S.C. section 360bbb-3(b)(1), unless the authorization is terminated or revoked.  Performed at [facility], [address] [place], [place] [address]   Urine Culture     Status: Abnormal   Collection Time: 04/12/23  4:30 PM   Specimen: Urine, Clean Catch  Result Value Ref Range Status   Specimen Description URINE, CLEAN CATCH  Final   Special Requests  NONE Reflexed from [id number]  Final   Culture (A)  Final    <10,000 COLONIES/mL INSIGNIFICANT GROWTH Performed at [facility], [name] [place], [place] [address]    Report Status 04/13/2023 FINAL  Final    Radiology Studies: No results found.  Scheduled Meds:  divalproex  500 mg Oral BID   enoxaparin (LOVENOX) injection  40 mg Subcutaneous Q24H   levETIRAcetam  750 mg Oral BID   pantoprazole  40 mg Oral Daily   rosuvastatin  10 mg Oral Daily   sodium chloride flush  3 mL Intravenous Q12H   Continuous Infusions:  cefTRIAXone (ROCEPHIN)  IV Stopped (04/16/23 0700)   lactated ringers 50 mL/hr at 04/13/23 1748   Assessment & Plan:   Principal Problem:   Pyelonephritis Active Problems:   Seizure disorder (HCC)   Asthma, chronic   Essential hypertension   Severe sepsis (HCC)   AKI (acute kidney injury) (HCC)  Severe sepsis secondary to pyelonephritis; ?developing renal abscess  -Sepsis resolved. -Continue with IV Rocephin 2 g. -Urine culture has had insignificant growth -Blood cultures x 2 have had no growth. -The patient has been discussed with urology Dr. Cardell Peach, he reviewed imaging, at this point no indication for drain placement, recommendation treat with antibiotics x 14 days, IV during hospital stay, can be transitioned to oral on discharge to  follow outpatient with Dr. Cardell Peach regarding  repeat imaging -Continue with IV fluids   AKI  - SCr is 1.27 on admission, up from baseline of ~0.6  -It has resolved to 0.63 - She has been fluid-resuscitated with 2.5 liters LR in ED - Hold hydrochlorothiazide and losartan, renally-dose medications, continue IVF hydration, and repeat chem panel in am  -Blood pressures have normalized. - Continue with IV fluids due to pyelonephritis   Seizure disorder  - Continue valproate and levetiracetam    Hypertension  Hold hydrochlorothiazide and losartan as above, treat as-needed only for now  -Blood pressures have normalized.  Hypokalemia - Repleted, continue to monitor.   Asthma  - Not in exacerbation on admission   - Continue as-needed albuterol   GERD -continue with PPI, will add Carafate given some active complaints of reflux  I have seen and examined this patient myself. I have spent 34 minutes in her evaluation and care.  DVT prophylaxis: Lovenox Code Status: Full Family Communication: none at bedside Disposition:    LOS: 4 days   Azaylea Maves, MD [facility]   To contact the attending provider between 7A-7P or the covering provider during after hours 7P-7A, please log into the web site [email or web address] and access using universal [place] password for that web site. If you do not have the password, please call the hospital operator.  04/16/2023, 6:24 PM

## 2023-04-17 DIAGNOSIS — N12 Tubulo-interstitial nephritis, not specified as acute or chronic: Secondary | ICD-10-CM | POA: Diagnosis not present

## 2023-04-17 LAB — BASIC METABOLIC PANEL
Anion gap: 8 (ref 5–15)
BUN: <5 mg/dL — ABNORMAL LOW (ref 8–23)
CO2: 26 mmol/L (ref 22–32)
Calcium: 8.6 mg/dL — ABNORMAL LOW (ref 8.9–10.3)
Chloride: 101 mmol/L (ref 98–111)
Creatinine, Ser: 0.59 mg/dL (ref 0.44–1.00)
GFR, Estimated: >60 mL/min (ref >60)
Glucose, Bld: 81 mg/dL (ref 70–99)
Potassium: 4.6 mmol/L (ref 3.5–5.1)
Sodium: 135 mmol/L (ref 135–145)

## 2023-04-17 LAB — CBC
HCT: 33.1 % — ABNORMAL LOW (ref 36.0–46.0)
Hemoglobin: 10.5 g/dL — ABNORMAL LOW (ref 12.0–15.0)
MCH: 29.1 pg (ref 26.0–34.0)
MCHC: 31.7 g/dL (ref 30.0–36.0)
MCV: 91.7 fL (ref 80.0–100.0)
Platelets: 204 10*3/uL (ref 150–400)
RBC: 3.61 MIL/uL — ABNORMAL LOW (ref 3.87–5.11)
RDW: 13.9 % (ref 11.5–15.5)
WBC: 7 10*3/uL (ref 4.0–10.5)
nRBC: 0 % (ref 0.0–0.2)

## 2023-04-17 LAB — CULTURE, BLOOD (ROUTINE X 2): Culture: NO GROWTH

## 2023-04-17 MED ORDER — HYDROMORPHONE HCL 2 MG PO TABS
1.0000 mg | ORAL_TABLET | Freq: Four times a day (QID) | ORAL | Status: DC | PRN
  Administered 2023-04-17 (×2): 1 mg via ORAL
  Filled 2023-04-17 (×2): qty 1

## 2023-04-17 MED ORDER — TRIAMCINOLONE 0.1 % CREAM:EUCERIN CREAM 1:1
TOPICAL_CREAM | Freq: Two times a day (BID) | CUTANEOUS | Status: DC
  Administered 2023-04-17: 1 via TOPICAL
  Filled 2023-04-17 (×2): qty 1

## 2023-04-17 MED ORDER — IBUPROFEN 600 MG PO TABS: 600.0000 mg | ORAL_TABLET | Freq: Three times a day (TID) | ORAL | Status: DC

## 2023-04-17 MED ORDER — ACETAMINOPHEN 500 MG PO TABS
1000.0000 mg | ORAL_TABLET | Freq: Four times a day (QID) | ORAL | Status: DC
  Administered 2023-04-17 – 2023-04-19 (×8): 1000 mg via ORAL
  Filled 2023-04-17 (×9): qty 2

## 2023-04-17 MED ORDER — ACETAMINOPHEN 650 MG RE SUPP: 650.0000 mg | Freq: Four times a day (QID) | RECTAL | Status: DC

## 2023-04-17 MED ORDER — HYDROMORPHONE HCL 2 MG PO TABS
1.0000 mg | ORAL_TABLET | ORAL | Status: DC | PRN
  Administered 2023-04-17 – 2023-04-18 (×3): 1 mg via ORAL
  Filled 2023-04-17 (×3): qty 1

## 2023-04-17 NOTE — Plan of Care (Signed)
  Problem: Respiratory: Goal: Ability to maintain adequate ventilation will improve Outcome: Progressing   Problem: Clinical Measurements: Goal: Ability to maintain clinical measurements within normal limits will improve Outcome: Progressing Goal: Will remain free from infection Outcome: Progressing   Problem: Activity: Goal: Risk for activity intolerance will decrease Outcome: Progressing   Problem: Elimination: Goal: Will not experience complications related to bowel motility Outcome: Progressing   Problem: Pain Managment: Goal: General experience of comfort will improve Outcome: Progressing   Problem: Skin Integrity: Goal: Risk for impaired skin integrity will decrease Outcome: Progressing

## 2023-04-17 NOTE — Progress Notes (Addendum)
PROGRESS NOTE    Lindsey [name]  ZOX:[id number] DOB: 1956-07-02 DOA: 04/12/2023 PCP: [facility]   Chief Complaint  Patient presents with   Fever   Brief Narrative:   Lindsey [name] is a 66 y.o. female with medical history significant for polysubstance abuse in remission since 2019, asthma, hypertension, GERD, and seizure disorder presenting with abdominal pain, nausea, vomiting, fever, and chills.  Workup significant for severe sepsis secondary to pyelonephritis with possible developing renal abscess.  The patient continues to complain of severe pain that is out of scale for her Dx at this point in her treatment. Repeat CT abdomen and pelvis suggests possible renal abscess. She is afebrile and WBC is decreasing. Urine cultures have had insignificant growth.   These CT findings were discussed with IR. Dr. Archer Asa identified an area on the posterior aspect of the superior pole of the left kidney that looked like an area where an abscess could developed. He referred to it as a "focal pyelonephritis". He recommended re-imaging in a couple of days if the patient's symptoms don't improve.  Status is: Inpatient   Consultants:  none  Subjective:  She is awake, alert, and oriented x 3. She continues to complain of severe flank pain. It actually seems worse than yesterday.   Objective: Vitals:   04/17/23 0431 04/17/23 0800 04/17/23 1200 04/17/23 1600  BP: (!) 141/67 (!) 151/76 (!) 147/68   Pulse: 76 82 84   Resp: 18 18 20    Temp: 97.8 F (36.6 C) 98 F (36.7 C) 98.7 F (37.1 C) 97.7 F (36.5 C)  TempSrc: Oral Oral Oral Oral  SpO2: 100% 100% 97%   Weight:      Height:        Intake/Output Summary (Last 24 hours) at 04/17/2023 1806 Last data filed at 04/17/2023 0756 Gross per 24 hour  Intake 749.94 ml  Output --  Net 749.94 ml    Filed Weights   04/12/23 1417  Weight: 75.8 kg    Exam:  Constitutional:  The patient is awake,  alert, and oriented x 3. Moderate stress from flank pain. Respiratory:  No increased work of breathing. No wheezes, rales, or rhonchi No tactile fremitus Cardiovascular:  Regular rate and rhythm No murmurs, ectopy, or gallups. No lateral PMI. No thrills. Abdomen:  Abdomen is soft, non-tender, non-distended No hernias, masses, or organomegaly Normoactive bowel sounds.  Musculoskeletal:  No cyanosis, clubbing, or edema Positive CA tenderness. Skin:  No rashes, lesions, ulcers palpation of skin: no induration or nodules Neurologic:  CN 2-12 intact Sensation all 4 extremities intact Psychiatric:  Mental status Mood, affect appropriate Orientation to person, place, time  judgment and insight appear intact  Data Reviewed: I have personally reviewed following labs and imaging studies  CBC: Recent Labs  Lab 04/12/23 1230 04/13/23 0532 04/14/23 0440 04/15/23 0047 04/16/23 0345 04/17/23 0617  WBC 13.9* 17.1* 12.6* 7.6 5.1 7.0  NEUTROABS 10.4*  --  10.8*  --   --   --   HGB 14.0 11.0* 11.2* 9.9* 11.0* 10.5*  HCT 41.7 33.3* 34.8* 30.0* 33.2* 33.1*  MCV 89.9 90.2 90.4 87.2 86.9 91.7  PLT 313 259 289 330 369 204    Basic Metabolic Panel: Recent Labs  Lab 04/13/23 0532 04/14/23 0440 04/14/23 1034 04/15/23 0047 04/16/23 0345 04/17/23 0617  NA 133* 134*  --  138 137 135  K 3.4* 5.6* 4.2 4.0 3.7 4.6  CL 102 100  --  106 102 101  CO2 21* 18*  --  25 25 26   GLUCOSE 170* 80  --  97 91 81  BUN 18 18  --  12 6* <5*  CREATININE 1.10* 0.74  --  0.63 0.66 0.59  CALCIUM 8.6* 9.2  --  8.8* 8.9 8.6*  MG  --  1.8  --   --   --   --   PHOS  --  2.0*  --   --   --   --     GFR: Estimated Creatinine Clearance: 69.8 mL/min (by C-G formula based on SCr of 0.59 mg/dL).  Liver Function Tests: Recent Labs  Lab 04/12/23 1230  AST 27  ALT 16  ALKPHOS 100  BILITOT 0.5  PROT 8.0  ALBUMIN 3.5    CBG: No results for input(s): "GLUCAP" in the last 168 hours.   Recent Results  (from the past 240 hour(s))  Blood Culture (routine x 2)     Status: None   Collection Time: 04/12/23 12:30 PM   Specimen: BLOOD RIGHT HAND  Result Value Ref Range Status   Specimen Description BLOOD RIGHT HAND  Final   Special Requests   Final    BOTTLES DRAWN AEROBIC AND ANAEROBIC Blood Culture results may not be optimal due to an excessive volume of blood received in culture bottles   Culture   Final    NO GROWTH 5 DAYS Performed at [facility], [address] [place], [place] [address]    Report Status 04/17/2023 FINAL  Final  Blood Culture (routine x 2)     Status: None   Collection Time: 04/12/23  3:10 PM   Specimen: BLOOD LEFT ARM  Result Value Ref Range Status   Specimen Description BLOOD LEFT ARM  Final   Special Requests   Final    BOTTLES DRAWN AEROBIC AND ANAEROBIC Blood Culture results may not be optimal due to an excessive volume of blood received in culture bottles   Culture   Final    NO GROWTH 5 DAYS Performed at [facility], [address], [place], [place] [address]    Report Status 04/17/2023 FINAL  Final  Resp panel by RT-PCR (RSV, Flu A&B, Covid) Anterior Nasal Swab     Status: None   Collection Time: 04/12/23  3:33 PM   Specimen: Anterior Nasal Swab  Result Value Ref Range Status   SARS Coronavirus 2 by RT PCR NEGATIVE NEGATIVE Final   Influenza A by PCR NEGATIVE NEGATIVE Final   Influenza B by PCR NEGATIVE NEGATIVE Final    Comment: (NOTE) The Xpert Xpress SARS-CoV-2/FLU/RSV plus assay is intended as an aid in the diagnosis of influenza from Nasopharyngeal swab specimens and should not be used as a sole basis for treatment. Nasal washings and aspirates are unacceptable for Xpert Xpress SARS-CoV-2/FLU/RSV testing.  Fact Sheet for Patients: BloggerCourse.com  Fact Sheet for Healthcare Providers: SeriousBroker.it  This test is not yet approved or cleared by the [place] FDA and has  been authorized for detection and/or diagnosis of SARS-CoV-2 by FDA under an Emergency Use Authorization (EUA). This EUA will remain in effect (meaning this test can be used) for the duration of the COVID-19 declaration under Section 564(b)(1) of the Act, 21 U.S.C. section 360bbb-3(b)(1), unless the authorization is terminated or revoked.     Resp Syncytial Virus by PCR NEGATIVE NEGATIVE Final    Comment: (NOTE) Fact Sheet for Patients: BloggerCourse.com  Fact Sheet for Healthcare Providers: SeriousBroker.it  This test  is not yet approved or cleared by the [place] and has been authorized for detection and/or diagnosis of SARS-CoV-2 by FDA under an Emergency Use Authorization (EUA). This EUA will remain in effect (meaning this test can be used) for the duration of the COVID-19 declaration under Section 564(b)(1) of the Act, 21 U.S.C. section 360bbb-3(b)(1), unless the authorization is terminated or revoked.  Performed at [facility], [address] [place], [place] [address]   Urine Culture     Status: Abnormal   Collection Time: 04/12/23  4:30 PM   Specimen: Urine, Clean Catch  Result Value Ref Range Status   Specimen Description URINE, CLEAN CATCH  Final   Special Requests NONE Reflexed from [id number]  Final   Culture (A)  Final    <10,000 COLONIES/mL INSIGNIFICANT GROWTH Performed at [facility], [address] [place], [place] [address]    Report Status 04/13/2023 FINAL  Final    Radiology Studies: No results found.  Scheduled Meds:  acetaminophen  1,000 mg Oral Q6H   Or   acetaminophen  650 mg Rectal Q6H   divalproex  500 mg Oral BID   enoxaparin (LOVENOX) injection  40 mg Subcutaneous Q24H   levETIRAcetam  750 mg Oral BID   pantoprazole  40 mg Oral Daily   rosuvastatin  10 mg Oral Daily   sodium chloride flush  3 mL Intravenous Q12H   triamcinolone 0.1 % cream : eucerin   Topical BID    Continuous Infusions:  cefTRIAXone (ROCEPHIN)  IV Stopped (04/17/23 1447)   lactated ringers 50 mL/hr at 04/17/23 1541   Assessment & Plan:   Principal Problem:   Pyelonephritis Active Problems:   Seizure disorder (HCC)   Asthma, chronic   Essential hypertension   Severe sepsis (HCC)   AKI (acute kidney injury) (HCC)  Severe sepsis secondary to pyelonephritis; ?developing renal abscess  -Sepsis resolved. -Continue with IV Rocephin 2 g. -Urine culture has had insignificant growth -Blood cultures x 2 have had no growth. -The patient has been discussed with urology Dr. Cardell Peach, he reviewed imaging, at this point no indication for drain placement, recommendation treat with antibiotics x 14 days, IV during hospital stay, can be transitioned to oral on discharge to follow outpatient with Dr. Cardell Peach regarding  repeat imaging -Continue with IV fluids -The patient continues to require round the clock oral dilaudid and actually seems to be in more pain today. Repeat CT abdomen and pelvis demonstrates possible renal abscess. I have discussed the patient with Dr. Archer Asa. He will evaluate the patient's scan to see if she may benefit from IR intervention.   AKI  - SCr is 1.27 on admission, up from baseline of ~0.6  -It has resolved to 0.59 - She has been fluid-resuscitated with 2.5 liters LR in ED - Hold hydrochlorothiazide and losartan, renally-dose medications, continue IVF hydration, and repeat chem panel in am  -Blood pressures have normalized. - Continue with IV fluids due to pyelonephritis   Seizure disorder  - Continue valproate and levetiracetam    Hypertension  Hold hydrochlorothiazide and losartan as above, treat as-needed only for now  -Blood pressures have normalized.  Hypokalemia - Repleted, continue to monitor.   Asthma  - Not in exacerbation on admission   - Continue as-needed albuterol   GERD -continue with PPI, will add Carafate given some active complaints of  reflux  I have seen and examined this patient myself. I have spent 42 minutes in her evaluation  and care.  DVT prophylaxis: Lovenox Code Status: Full Family Communication: none at bedside Disposition:    LOS: 5 days   Colin Ellers Gerri Lins, MD [facility]   To contact the attending provider between 7A-7P or the covering provider during after hours 7P-7A, please log into the web site [email or web address] and access using universal [place] password for that web site. If you do not have the password, please call the hospital operator.  04/17/2023, 6:06 PM

## 2023-04-18 ENCOUNTER — Inpatient Hospital Stay (HOSPITAL_COMMUNITY): Payer: Medicare (Managed Care)

## 2023-04-18 DIAGNOSIS — N12 Tubulo-interstitial nephritis, not specified as acute or chronic: Secondary | ICD-10-CM | POA: Diagnosis not present

## 2023-04-18 LAB — CBC
HCT: 30.8 % — ABNORMAL LOW (ref 36.0–46.0)
Hemoglobin: 10.1 g/dL — ABNORMAL LOW (ref 12.0–15.0)
MCH: 28.9 pg (ref 26.0–34.0)
MCHC: 32.8 g/dL (ref 30.0–36.0)
MCV: 88 fL (ref 80.0–100.0)
Platelets: 405 10*3/uL — ABNORMAL HIGH (ref 150–400)
RBC: 3.5 MIL/uL — ABNORMAL LOW (ref 3.87–5.11)
RDW: 13.9 % (ref 11.5–15.5)
WBC: 7 10*3/uL (ref 4.0–10.5)
nRBC: 0 % (ref 0.0–0.2)

## 2023-04-18 LAB — BASIC METABOLIC PANEL
Anion gap: 7 (ref 5–15)
BUN: 11 mg/dL (ref 8–23)
CO2: 30 mmol/L (ref 22–32)
Calcium: 9 mg/dL (ref 8.9–10.3)
Chloride: 100 mmol/L (ref 98–111)
Creatinine, Ser: 0.61 mg/dL (ref 0.44–1.00)
GFR, Estimated: >60 mL/min (ref >60)
Glucose, Bld: 91 mg/dL (ref 70–99)
Potassium: 4.6 mmol/L (ref 3.5–5.1)
Sodium: 137 mmol/L (ref 135–145)

## 2023-04-18 MED ORDER — LOSARTAN POTASSIUM 50 MG PO TABS
50.0000 mg | ORAL_TABLET | Freq: Every day | ORAL | Status: DC
  Administered 2023-04-18 – 2023-04-19 (×2): 50 mg via ORAL
  Filled 2023-04-18 (×2): qty 1

## 2023-04-18 MED ORDER — HYDROCHLOROTHIAZIDE 12.5 MG PO TABS
12.5000 mg | ORAL_TABLET | Freq: Every day | ORAL | Status: DC
  Administered 2023-04-18 – 2023-04-19 (×2): 12.5 mg via ORAL
  Filled 2023-04-18 (×2): qty 1

## 2023-04-18 NOTE — Progress Notes (Signed)
PROGRESS NOTE    Lindsey [name]  WUJ:[id number] DOB: [birth year] DOA: 04/12/2023 PCP: [facility]   Chief Complaint  Patient presents with   Fever    Brief Narrative:   Lindsey [name] is a 67 y.o. female with medical history significant for polysubstance abuse in remission since 2019, asthma, hypertension, GERD, and seizure disorder presenting with abdominal pain, nausea, vomiting, fever, and chills. Workup significant for severe sepsis secondary to pyelonephritis with possible developing renal abscess.  CT was discussed with urology who recommended outpatient workup, and previous MD discussed with IR Dr. Archer Asa identified an area on the posterior aspect of the superior pole of the left kidney that looked like an area where an abscess could developed. He referred to it as a "focal pyelonephritis". He recommended re-imaging in a couple of days if the patient's symptoms don't improve.   Assessment & Plan:   Principal Problem:   Pyelonephritis Active Problems:   Seizure disorder (HCC)   Asthma, chronic   Essential hypertension   Severe sepsis (HCC)   AKI (acute kidney injury) (HCC)   Severe sepsis secondary to pyelonephritis; ?developing renal abscess  -Continue with IV Rocephin 2 g, follow urine cultures, blood cultures. -I have discussed with urology Dr. Cardell Peach, he reviewed imaging, at this point no indication for drain placement, recommendation treat with antibiotics x 14 days, IV during hospital stay, can be transitioned to oral on discharge to follow outpatient with Dr. Cardell Peach regarding  repeat imaging -previous MD discussed with IR Dr. Archer Asa, findings more likely represent focal pyelonephritis, repeat imaging as an outpatient.   AKI  - SCr is 1.27 on admission, up from baseline of ~0.6  - She has been fluid-resuscitated with 2.5 liters LR in ED  - Hold hydrochlorothiazide and losartan, renally-dose medications, continue IVF hydration, and  repeat chem panel in am   Seizure disorder  - Continue valproate and levetiracetam    Hypertension  Hold hydrochlorothiazide and losartan as above, treat as-needed only for now  -Now blood pressure started to increase will resume home medications.  Hypokalemia - Repleted, recheck in a.m.   Asthma  - Not in exacerbation on admission   - Continue as-needed albuterol   Hyperkalemia -Hemolyzed , Repeat level within normal limit.    GERD -continue with PPI, will add Carafate given some active complaints of reflux   DVT prophylaxis: Lovenox Code Status: Full Family Communication: none at bedside with daughter by phone Disposition:   Status is: Inpatient    Consultants:  none   Subjective:  She is complaining of abdominal pain today, in the right side.  Objective: Vitals:   04/17/23 1932 04/17/23 2321 04/18/23 0310 04/18/23 0800  BP: (!) 141/71 (!) 144/69 (!) 147/74 (!) 158/86  Pulse: 83 82 82 78  Resp: 20 13 19 20   Temp: 98 F (36.7 C) 98 F (36.7 C) 97.9 F (36.6 C)   TempSrc: Oral Oral    SpO2: 100% 100% 100% 100%  Weight:      Height:        Intake/Output Summary (Last 24 hours) at 04/18/2023 1446 Last data filed at 04/17/2023 2133 Gross per 24 hour  Intake 2548.28 ml  Output --  Net 2548.28 ml   Filed Weights   04/12/23 1417  Weight: 75.8 kg    Examination:  Awake Alert, Oriented X 3, No new F.N deficits, Normal affect Symmetrical Chest wall movement, Good air movement bilaterally, CTAB RRR,No Gallops,Rubs or new Murmurs, No Parasternal  Heave +ve B.Sounds, Abd Soft, left abdomen tenderness to palpation No Cyanosis, Clubbing or edema, No new Rash or bruise        Data Reviewed: I have personally reviewed following labs and imaging studies  CBC: Recent Labs  Lab 04/12/23 1230 04/13/23 0532 04/14/23 0440 04/15/23 0047 04/16/23 0345 04/17/23 0617 04/18/23 0237  WBC 13.9*   < > 12.6* 7.6 5.1 7.0 7.0  NEUTROABS 10.4*  --  10.8*  --    --   --   --   HGB 14.0   < > 11.2* 9.9* 11.0* 10.5* 10.1*  HCT 41.7   < > 34.8* 30.0* 33.2* 33.1* 30.8*  MCV 89.9   < > 90.4 87.2 86.9 91.7 88.0  PLT 313   < > 289 330 369 204 405*   < > = values in this interval not displayed.    Basic Metabolic Panel: Recent Labs  Lab 04/14/23 0440 04/14/23 1034 04/15/23 0047 04/16/23 0345 04/17/23 0617 04/18/23 0237  NA 134*  --  138 137 135 137  K 5.6* 4.2 4.0 3.7 4.6 4.6  CL 100  --  106 102 101 100  CO2 18*  --  25 25 26 30   GLUCOSE 80  --  97 91 81 91  BUN 18  --  12 6* <5* 11  CREATININE 0.74  --  0.63 0.66 0.59 0.61  CALCIUM 9.2  --  8.8* 8.9 8.6* 9.0  MG 1.8  --   --   --   --   --   PHOS 2.0*  --   --   --   --   --     GFR: Estimated Creatinine Clearance: 69.8 mL/min (by C-G formula based on SCr of 0.61 mg/dL).  Liver Function Tests: Recent Labs  Lab 04/12/23 1230  AST 27  ALT 16  ALKPHOS 100  BILITOT 0.5  PROT 8.0  ALBUMIN 3.5    CBG: No results for input(s): "GLUCAP" in the last 168 hours.   Recent Results (from the past 240 hour(s))  Blood Culture (routine x 2)     Status: None   Collection Time: 04/12/23 12:30 PM   Specimen: BLOOD RIGHT HAND  Result Value Ref Range Status   Specimen Description BLOOD RIGHT HAND  Final   Special Requests   Final    BOTTLES DRAWN AEROBIC AND ANAEROBIC Blood Culture results may not be optimal due to an excessive volume of blood received in culture bottles   Culture   Final    NO GROWTH 5 DAYS Performed at [facility], [address] [place], [place] [address]    Report Status 04/17/2023 FINAL  Final  Blood Culture (routine x 2)     Status: None   Collection Time: 04/12/23  3:10 PM   Specimen: BLOOD LEFT ARM  Result Value Ref Range Status   Specimen Description BLOOD LEFT ARM  Final   Special Requests   Final    BOTTLES DRAWN AEROBIC AND ANAEROBIC Blood Culture results may not be optimal due to an excessive volume of blood received in culture bottles   Culture    Final    NO GROWTH 5 DAYS Performed at [facility], [address] [place], [place] [address]    Report Status 04/17/2023 FINAL  Final  Resp panel by RT-PCR (RSV, Flu A&B, Covid) Anterior Nasal Swab     Status: None   Collection Time: 04/12/23  3:33 PM  Specimen: Anterior Nasal Swab  Result Value Ref Range Status   SARS Coronavirus 2 by RT PCR NEGATIVE NEGATIVE Final   Influenza A by PCR NEGATIVE NEGATIVE Final   Influenza B by PCR NEGATIVE NEGATIVE Final    Comment: (NOTE) The Xpert Xpress SARS-CoV-2/FLU/RSV plus assay is intended as an aid in the diagnosis of influenza from Nasopharyngeal swab specimens and should not be used as a sole basis for treatment. Nasal washings and aspirates are unacceptable for Xpert Xpress SARS-CoV-2/FLU/RSV testing.  Fact Sheet for Patients: BloggerCourse.com  Fact Sheet for Healthcare Providers: SeriousBroker.it  This test is not yet approved or cleared by the [place] FDA and has been authorized for detection and/or diagnosis of SARS-CoV-2 by FDA under an Emergency Use Authorization (EUA). This EUA will remain in effect (meaning this test can be used) for the duration of the COVID-19 declaration under Section 564(b)(1) of the Act, 21 U.S.C. section 360bbb-3(b)(1), unless the authorization is terminated or revoked.     Resp Syncytial Virus by PCR NEGATIVE NEGATIVE Final    Comment: (NOTE) Fact Sheet for Patients: BloggerCourse.com  Fact Sheet for Healthcare Providers: SeriousBroker.it  This test is not yet approved or cleared by the [place] FDA and has been authorized for detection and/or diagnosis of SARS-CoV-2 by FDA under an Emergency Use Authorization (EUA). This EUA will remain in effect (meaning this test can be used) for the duration of the COVID-19 declaration under Section 564(b)(1) of the Act, 21 U.S.C. section  360bbb-3(b)(1), unless the authorization is terminated or revoked.  Performed at [facility], [address] [place], [place] [address]   Urine Culture     Status: Abnormal   Collection Time: 04/12/23  4:30 PM   Specimen: Urine, Clean Catch  Result Value Ref Range Status   Specimen Description URINE, CLEAN CATCH  Final   Special Requests NONE Reflexed from [id number]  Final   Culture (A)  Final    <10,000 COLONIES/mL INSIGNIFICANT GROWTH Performed at [facility], [address] [place], [place] [address]    Report Status 04/13/2023 FINAL  Final         Radiology Studies: No results found.      Scheduled Meds:  acetaminophen  1,000 mg Oral Q6H   Or   acetaminophen  650 mg Rectal Q6H   divalproex  500 mg Oral BID   enoxaparin (LOVENOX) injection  40 mg Subcutaneous Q24H   levETIRAcetam  750 mg Oral BID   pantoprazole  40 mg Oral Daily   rosuvastatin  10 mg Oral Daily   sodium chloride flush  3 mL Intravenous Q12H   triamcinolone 0.1 % cream : eucerin   Topical BID   Continuous Infusions:  cefTRIAXone (ROCEPHIN)  IV 2 g (04/18/23 0510)   lactated ringers 50 mL/hr at 04/17/23 1800     LOS: 6 days      Huey Bienenstock, MD [facility]   To contact the attending provider between 7A-7P or the covering provider during after hours 7P-7A, please log into the web site [email or web address] and access using universal [place] password for that web site. If you do not have the password, please call the hospital operator.  04/18/2023, 2:46 PM

## 2023-04-18 NOTE — Plan of Care (Signed)
  Problem: Clinical Measurements: Goal: Will remain free from infection Outcome: Progressing   Problem: Activity: Goal: Risk for activity intolerance will decrease Outcome: Progressing   Problem: Elimination: Goal: Will not experience complications related to bowel motility Outcome: Progressing   Problem: Pain Managment: Goal: General experience of comfort will improve Outcome: Progressing   Problem: Skin Integrity: Goal: Risk for impaired skin integrity will decrease Outcome: Progressing   

## 2023-04-18 NOTE — Plan of Care (Signed)

## 2023-04-18 NOTE — Progress Notes (Signed)
Physical Therapy Treatment Patient Details Name: Lindsey [name] MRN: [id number] DOB: [birth year] Today's Date: 04/18/2023   History of Present Illness Pt is 67 yo female admitted on 03/24/23 with sepsis secondary to pyelonephritis and potential renal abscess. Pt with hx including but not limited to polysubstance abuse in remission since 2019, asthma, HTN, GERD, seizures    PT Comments  Pt received in supine, c/o BLE discomfort and bed noted to be wet from urine, pt agreeable to therapy session with emphasis on transfers, standing balance, skin integrity protection and gait training. Pt needing up to min guard for longer household distance gait trial using RW and able to perform some static standing self-care tasks with no UE support and no overt LOB. Pt continues to benefit from PT services to progress toward functional mobility goals.     Assistance Recommended at Discharge PRN  If plan is discharge home, recommend the following:  Can travel by private vehicle    Assistance with cooking/housework;Help with stairs or ramp for entrance;Assist for transportation      Equipment Recommendations  None recommended by PT    Recommendations for Other Services       Precautions / Restrictions Precautions Precautions: Fall Precaution Comments: urinary incontinence Restrictions Weight Bearing Restrictions: No     Mobility  Bed Mobility Overal bed mobility: Needs Assistance Bed Mobility: Supine to Sit, Sit to Supine     Supine to sit: Supervision Sit to supine: Supervision   General bed mobility comments: supervision for lines    Transfers Overall transfer level: Needs assistance Equipment used: Rolling walker (2 wheels) Transfers: Sit to/from Stand Sit to Stand: Supervision           General transfer comment: EOB and recliner    Ambulation/Gait Ambulation/Gait assistance: Min guard, Supervision Gait Distance (Feet): 185 Feet Assistive device: Rolling walker (2  wheels) Gait Pattern/deviations: Step-through pattern, Decreased stride length Gait velocity: decr     General Gait Details: Min guard progressing to close supervision, tolerated well, reports feels near baseline walking. HR 80's bpm resting and no acute s/sx distress during gait trial.   Stairs Stairs:  (pt defers)           Wheelchair Mobility     Tilt Bed    Modified Rankin (Stroke Patients Only)       Balance Overall balance assessment: Needs assistance Sitting-balance support: No upper extremity supported Sitting balance-Leahy Scale: Good     Standing balance support: Single extremity supported, During functional activity, No upper extremity supported Standing balance-Leahy Scale: Good Standing balance comment: able to stand at sink and perform sink bath, benefits from RW for gait progression                            Cognition Arousal/Alertness: Awake/alert Behavior During Therapy: WFL for tasks assessed/performed Overall Cognitive Status: Within Functional Limits for tasks assessed                                 General Comments: Tangential        Exercises      General Comments General comments (skin integrity, edema, etc.): BP 143/83 after return to supine post-exertion      Pertinent Vitals/Pain Pain Assessment Pain Assessment: Faces Faces Pain Scale: Hurts even more Pain Location: peri area and R flank Pain Descriptors / Indicators: Discomfort, Sore, Grimacing, Guarding Pain Intervention(s): Monitored during  session, Limited activity within patient's tolerance, Repositioned    Home Living                          Prior Function            PT Goals (current goals can now be found in the care plan section) Acute Rehab PT Goals Patient Stated Goal: return to shelter at d/c PT Goal Formulation: With patient Time For Goal Achievement: 04/29/23 Progress towards PT goals: Progressing toward goals     Frequency    Min 3X/week      PT Plan Current plan remains appropriate    Co-evaluation              AM-PAC PT "6 Clicks" Mobility   Outcome Measure  Help needed turning from your back to your side while in a flat bed without using bedrails?: None Help needed moving from lying on your back to sitting on the side of a flat bed without using bedrails?: A Little Help needed moving to and from a bed to a chair (including a wheelchair)?: A Little Help needed standing up from a chair using your arms (e.g., wheelchair or bedside chair)?: A Little Help needed to walk in hospital room?: A Little Help needed climbing 3-5 steps with a railing? : A Little 6 Click Score: 19    End of Session Equipment Utilized During Treatment: Gait belt Activity Tolerance: Patient tolerated treatment well Patient left: with call bell/phone within reach;in bed;with nursing/sitter in room;Other (comment) (had been in recliner however RN requested PTA get her back to bed for ultrasound) Nurse Communication: Mobility status PT Visit Diagnosis: Other abnormalities of gait and mobility (R26.89)     Time: 2841-3244 PT Time Calculation (min) (ACUTE ONLY): 26 min  Charges:    $Gait Training: 8-22 mins $Therapeutic Activity: 8-22 mins PT General Charges $$ ACUTE PT VISIT: 1 Visit                     Lindsey [name] P., PTA Acute Rehabilitation Services Secure Chat Preferred 9a-5:30pm Office: [phone]    Lindsey [name] [facility] 04/18/2023, 5:30 PM

## 2023-04-19 ENCOUNTER — Other Ambulatory Visit (HOSPITAL_COMMUNITY): Payer: Self-pay

## 2023-04-19 DIAGNOSIS — G40909 Epilepsy, unspecified, not intractable, without status epilepticus: Secondary | ICD-10-CM | POA: Diagnosis not present

## 2023-04-19 DIAGNOSIS — N179 Acute kidney failure, unspecified: Secondary | ICD-10-CM | POA: Diagnosis not present

## 2023-04-19 DIAGNOSIS — I10 Essential (primary) hypertension: Secondary | ICD-10-CM | POA: Diagnosis not present

## 2023-04-19 DIAGNOSIS — N12 Tubulo-interstitial nephritis, not specified as acute or chronic: Secondary | ICD-10-CM | POA: Diagnosis not present

## 2023-04-19 LAB — CREATININE, SERUM
Creatinine, Ser: 0.62 mg/dL (ref 0.44–1.00)
GFR, Estimated: >60 mL/min (ref >60)

## 2023-04-19 MED ORDER — SODIUM CHLORIDE 0.9 % IV SOLN: 2.0000 g | INTRAVENOUS | Status: DC

## 2023-04-19 MED ORDER — ORAL CARE MOUTH RINSE: 15.0000 mL | OROMUCOSAL | Status: DC | PRN

## 2023-04-19 MED ORDER — ORAL CARE MOUTH RINSE
15.0000 mL | OROMUCOSAL | Status: DC
  Administered 2023-04-19 (×4): 15 mL via OROMUCOSAL

## 2023-04-19 MED ORDER — CEPHALEXIN 500 MG PO CAPS
500.0000 mg | ORAL_CAPSULE | Freq: Three times a day (TID) | ORAL | 0 refills | Status: DC
Start: 2023-04-19 — End: 2023-04-19
  Filled 2023-04-19: qty 21, 7d supply, fill #0

## 2023-04-19 MED ORDER — CEFADROXIL 500 MG PO CAPS
1000.0000 mg | ORAL_CAPSULE | Freq: Two times a day (BID) | ORAL | 0 refills | Status: AC
Start: 2023-04-19 — End: 2023-04-26
  Filled 2023-04-19: qty 28, 7d supply, fill #0

## 2023-04-19 NOTE — Discharge Summary (Signed)
Physician Discharge Summary  Lindsey [name] WUJ:[id number] DOB: [birth year] DOA: 04/12/2023  PCP: [facility]  Admit date: 04/12/2023 Discharge date: 04/19/2023  Admitted From: (Home) Disposition:  (Home)  Recommendations for Outpatient Follow-up:  Patient will need repeat CT abdomen pelvis with IV contrast in 2 weeks to ensure resolution of the fluid collection in the left kidney Patient will need to follow-up with urology as an outpatient Dr. Cardell Peach, please schedule an appointment.   Discharge Condition: (Stable) CODE STATUS: (FULL) Diet recommendation: Heart Healthy   Brief/Interim Summary:  Lindsey [name] is a 67 y.o. female with medical history significant for polysubstance abuse in remission since 2019, asthma, hypertension, GERD, and seizure disorder presenting with abdominal pain, nausea, vomiting, fever, and chills. Workup significant for severe sepsis secondary to pyelonephritis with possible developing renal abscess.  CT findings were  discussed with urology Dr Cardell Peach who recommended outpatient workup, as well  previous MD discussed with IR Dr. Archer Asa identified an area on the posterior aspect of the superior pole of the left kidney that looked like an area where an abscess could developed. He referred to it as a "focal pyelonephritis". He recommended re-imaging i if the patient's symptoms don't improve.  Patient was treated with IV Rocephin during hospital stay, her blood cultures has been negative, as well her urine culture has been unhelpful, so she will be discharged on another 7 days of oral Keflex as an outpatient, with recommendation to follow-up with urology as an outpatient, and repeat imaging as an outpatient, she is afebrile, no leukocytosis, she has no left CVA tenderness (only significant finding on exam was right CVA tenderness likely musculoskeletal).    Severe sepsis secondary to pyelonephritis; with questionable developing renal  abscess  -Treated with IV Rocephin during hospital stay, to continue another week of Keflex as an outpatient.   -I have discussed with urology Dr. Cardell Peach, he reviewed imaging, at this point no indication for drain placement, recommendation treat with antibiotics x 14 days, IV during hospital stay, can be transitioned to oral on discharge to follow outpatient with Dr. Cardell Peach regarding  repeat imaging -previous MD discussed with IR Dr. Archer Asa, findings more likely represent focal pyelonephritis, repeat imaging as an outpatient.   AKI  - SCr is 1.27 on admission, up from baseline of ~0.6  - resolved  Seizure disorder  - Continue valproate and levetiracetam    Hypertension -Medication has been held initially due to soft blood pressure and AKI, but now blood pressure started to increase will resume home medications.   Hypokalemia - Repleted   Asthma  - Not in exacerbation on admission   - Continue as-needed albuterol    Hyperkalemia -Hemolyzed , Repeat level within normal limit.     GERD -continue with PPI    Discharge Diagnoses:  Principal Problem:   Pyelonephritis Active Problems:   Seizure disorder (HCC)   Asthma, chronic   Essential hypertension   Severe sepsis (HCC)   AKI (acute kidney injury) [facility])    Discharge Instructions  Discharge Instructions     Diet - low sodium heart healthy   Complete by: As directed    Discharge instructions   Complete by: As directed    Follow with Primary [facility] in 7 days   Get CBC, CMP,  checked  by Primary MD next visit.    Activity: As tolerated with Full fall precautions use walker/cane & assistance as needed   Disposition Home  Diet: Heart Healthy    On your next visit with your primary care physician please Get Medicines reviewed and adjusted.   Please request your Prim.MD to go over all Hospital Tests and Procedure/Radiological results at the follow up, please get all  Hospital records sent to your Prim MD by signing hospital release before you go home.   If you experience worsening of your admission symptoms, develop shortness of breath, life threatening emergency, suicidal or homicidal thoughts you must seek medical attention immediately by calling 911 or calling your MD immediately  if symptoms less severe.  You Must read complete instructions/literature along with all the possible adverse reactions/side effects for all the Medicines you take and that have been prescribed to you. Take any new Medicines after you have completely understood and accpet all the possible adverse reactions/side effects.   Do not drive, operating heavy machinery, perform activities at heights, swimming or participation in water activities or provide baby sitting services if your were admitted for syncope or siezures until you have seen by Primary MD or a Neurologist and advised to do so again.  Do not drive when taking Pain medications.    Do not take more than prescribed Pain, Sleep and Anxiety Medications  Special Instructions: If you have smoked or chewed Tobacco  in the last 2 yrs please stop smoking, stop any regular Alcohol  and or any Recreational drug use.  Wear Seat belts while driving.   Please note  You were cared for by a hospitalist during your hospital stay. If you have any questions about your discharge medications or the care you received while you were in the hospital after you are discharged, you can call the unit and asked to speak with the hospitalist on call if the hospitalist that took care of you is not available. Once you are discharged, your primary care physician will handle any further medical issues. Please note that NO REFILLS for any discharge medications will be authorized once you are discharged, as it is imperative that you return to your primary care physician (or establish a relationship with a primary care physician if you do not have one) for  your aftercare needs so that they can reassess your need for medications and monitor your lab values.   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    Ibuprofen Swelling   Kiwi Extract Hives, Itching   Morphine And Codeine Hives   Penicillins Hives, Other (See Comments)   Has patient had a PCN reaction causing immediate rash, facial/tongue/throat swelling, SOB or lightheadedness with hypotension: Yes Has patient had a PCN reaction causing severe rash involving mucus membranes or skin necrosis: No Has patient had a PCN reaction that required hospitalization No Has patient had a PCN reaction occurring within the last 10 years: No If all of the above answers are "NO", then may proceed with Cephalosporin use.   Strawberry Extract Hives, Itching        Medication List     TAKE these medications    albuterol 108 (90 Base) MCG/ACT inhaler Commonly known as: VENTOLIN HFA Inhale 2 puffs into the lungs 2 (two) times daily  as needed for wheezing or shortness of breath.   cephALEXin 500 MG capsule Commonly known as: KEFLEX Take 1 capsule (500 mg total) by mouth 3 (three) times daily for 7 days.   divalproex 500 MG 24 hr tablet Commonly known as: DEPAKOTE ER Take 500 mg by mouth in the morning and at bedtime.   hydrochlorothiazide 12.5 MG tablet Commonly known as: HYDRODIURIL Take 12.5 mg by mouth daily.   levETIRAcetam 750 MG tablet Commonly known as: KEPPRA Take 1 tablet (750 mg total) by mouth 2 (two) times daily.   losartan 50 MG tablet Commonly known as: COZAAR Take 50 mg by mouth daily.   pantoprazole 40 MG tablet Commonly known as: PROTONIX Take 1 tablet (40 mg total) by mouth daily.    rosuvastatin 10 MG tablet Commonly known as: CRESTOR Take 10 mg by mouth daily.   senna-docusate 8.6-50 MG tablet Commonly known as: Senokot-S Take 1 tablet by mouth daily as needed for mild constipation.   Tylenol 8 Hour Arthritis Pain 650 MG CR tablet Generic drug: acetaminophen Take 650-1,300 mg by mouth 2 (two) times daily.        Follow-up [employer], [facility] Follow up.   Contact information: [address] [place] [address] [phone]         Jannifer Hick, MD Follow up.   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    Ibuprofen Swelling   Kiwi Extract Hives and Itching   Morphine And Codeine Hives   Penicillins Hives and Other (See Comments)    Has patient had a PCN reaction causing immediate rash, facial/tongue/throat swelling, SOB or lightheadedness with hypotension: Yes Has patient had a PCN reaction causing severe rash involving mucus membranes or skin necrosis: No Has patient had a PCN reaction that required hospitalization No Has patient had a PCN reaction occurring within the last 10 years: No If all of the above answers are "NO", then may proceed with Cephalosporin use.   Strawberry Extract Hives and Itching    Consultations: Case was discussed by phone with urology and IR.   Procedures/Studies: US RENAL  Result Date: 04/18/2023 CLINICAL DATA:  Right flank pain EXAM: RENAL / URINARY TRACT ULTRASOUND COMPLETE COMPARISON:  CT 04/12/2023 FINDINGS: Right Kidney: Renal measurements: 8.0 x 4.2 x 4.3 cm = volume: 75 mL. Preserved parenchyma. Multiple simple appearing cysts  identified in the right kidney such as in the upper pole measuring 4.3 cm. Please correlate with prior contrast CT scan Left Kidney: Renal measurements: 9.5 x 5.0 x 5.3 cm = volume: 130 mL. Echogenicity within normal limits. No mass or hydronephrosis visualized. Bladder: Poorly seen. Other: None. IMPRESSION: No collecting system dilatation. Benign right-sided renal cyst as seen on prior CT. If there is further concern of the sequela of pyelonephritis, follow up contrast CT could be considered when clinically appropriate to further delineate Electronically Signed   By: Karen Kays M.D.   On: 04/18/2023 17:02   CT ABDOMEN PELVIS W CONTRAST  Result Date: 04/12/2023 CLINICAL DATA:  Acute nonlocalized abdominal  pain EXAM: CT ABDOMEN AND PELVIS WITH CONTRAST TECHNIQUE: Multidetector CT imaging of the abdomen and pelvis was performed using the standard protocol following bolus administration of intravenous contrast. RADIATION DOSE REDUCTION: This exam was performed according to the departmental dose-optimization program which includes automated exposure control, adjustment of the mA and/or kV according to patient size and/or use of iterative reconstruction technique. CONTRAST:  60mL OMNIPAQUE IOHEXOL 350 MG/ML SOLN COMPARISON:  09/01/2021 FINDINGS: Lower chest: Lung bases are clear. Hepatobiliary: No focal liver abnormality is seen. Status post cholecystectomy. No biliary dilatation. Pancreas: Unremarkable. No pancreatic ductal dilatation or surrounding inflammatory changes. Spleen: Normal in size without focal abnormality. Adrenals/Urinary Tract: No adrenal gland nodules. Multiple renal cysts bilaterally. Largest is in the right upper pole measuring 4.7 cm diameter. No imaging follow-up is indicated. There is no hydronephrosis but there is evidence of stranding around the left kidney, pelvis, and ureter. Parenchymal lesion in the left kidney measuring 3.1 cm diameter with hazy hypo enhanced appearance on the delayed  images. Mild striation of the nephrogram. Changes likely indicate pyelonephritis with possible parenchymal abscess. Follow-up after resolution of acute process is recommended to exclude underlying focal mass. Bladder is normal. Stomach/Bowel: Stomach, small bowel, and colon are not abnormally distended. No wall thickening or inflammatory changes. Appendix is not identified. Vascular/Lymphatic: Aortic atherosclerosis. No enlarged abdominal or pelvic lymph nodes. Reproductive: Status post hysterectomy. No adnexal masses. Other: No abdominal wall hernia or abnormality. No abdominopelvic ascites. Musculoskeletal: No acute or significant osseous findings. IMPRESSION: 1. Left renal and ureteral stranding with heterogeneous nephrogram likely representing pyelonephritis. Focal hypoenhancing area in the left renal parenchyma may indicate a developing parenchymal abscess. Follow-up to resolution is recommended to exclude underlying focal neoplasm. 2. Aortic atherosclerosis. Electronically Signed   By: Burman Nieves M.D.   On: 04/12/2023 18:54   DG Chest Port 1 View  Result Date: 04/12/2023 CLINICAL DATA:  Fever.  Possible sepsis. EXAM: PORTABLE CHEST 1 VIEW COMPARISON:  01/10/2023 FINDINGS: Artifact overlies the chest. Heart size is normal. Chronic aortic atherosclerosis. The lungs are clear. I think there is artificial opacity over the upper chest. Bilateral symmetric apical pneumonia seems unlikely. No pleural effusion. No acute bone finding. IMPRESSION: No active disease suspected. Artifact overlies the upper chest. Bilateral symmetric apical pneumonia seems unlikely. Electronically Signed   By: Paulina Fusi M.D.   On: 04/12/2023 13:47      Subjective: No significant events overnight, she denies any complaints today, she is eager to go home  Discharge Exam: Vitals:   04/19/23 0456 04/19/23 0800  BP: 130/69 (!) 149/114  Pulse: 82 82  Resp: 16 (!) 22  Temp: 97.7 F (36.5 C) 97.7 F (36.5 C)  SpO2: 95%  97%   Vitals:   04/18/23 1937 04/18/23 2256 04/19/23 0456 04/19/23 0800  BP: (!) 149/66 (!) 157/80 130/69 (!) 149/114  Pulse: 80 80 82 82  Resp: (!) 23 (!) 22 16 (!) 22  Temp: 98 F (36.7 C) 97.6 F (36.4 C) 97.7 F (36.5 C) 97.7 F (36.5 C)  TempSrc: Oral Oral Oral   SpO2: 94% 100% 95% 97%  Weight:      Height:        General: Pt is alert, awake, not in acute distress Cardiovascular: RRR, S1/S2 +, no rubs, no gallops Respiratory: CTA bilaterally, no wheezing, no rhonchi Abdominal: Soft, NT, ND, bowel sounds + Extremities: no edema, no cyanosis    The results of significant diagnostics from this hospitalization (including imaging, microbiology, ancillary and  laboratory) are listed below for reference.     Microbiology: Recent Results (from the past 240 hour(s))  Blood Culture (routine x 2)     Status: None   Collection Time: 04/12/23 12:30 PM   Specimen: BLOOD RIGHT HAND  Result Value Ref Range Status   Specimen Description BLOOD RIGHT HAND  Final   Special Requests   Final    BOTTLES DRAWN AEROBIC AND ANAEROBIC Blood Culture results may not be optimal due to an excessive volume of blood received in culture bottles   Culture   Final    NO GROWTH 5 DAYS Performed at [facility], [address], [place], [place] [address]    Report Status 04/17/2023 FINAL  Final  Blood Culture (routine x 2)     Status: None   Collection Time: 04/12/23  3:10 PM   Specimen: BLOOD LEFT ARM  Result Value Ref Range Status   Specimen Description BLOOD LEFT ARM  Final   Special Requests   Final    BOTTLES DRAWN AEROBIC AND ANAEROBIC Blood Culture results may not be optimal due to an excessive volume of blood received in culture bottles   Culture   Final    NO GROWTH 5 DAYS Performed at [facility], [address], [place], [place] [address]    Report Status 04/17/2023 FINAL  Final  Resp panel by RT-PCR (RSV, Flu A&B, Covid) Anterior Nasal Swab     Status: None   Collection  Time: 04/12/23  3:33 PM   Specimen: Anterior Nasal Swab  Result Value Ref Range Status   SARS Coronavirus 2 by RT PCR NEGATIVE NEGATIVE Final   Influenza A by PCR NEGATIVE NEGATIVE Final   Influenza B by PCR NEGATIVE NEGATIVE Final    Comment: (NOTE) The Xpert Xpress SARS-CoV-2/FLU/RSV plus assay is intended as an aid in the diagnosis of influenza from Nasopharyngeal swab specimens and should not be used as a sole basis for treatment. Nasal washings and aspirates are unacceptable for Xpert Xpress SARS-CoV-2/FLU/RSV testing.  Fact Sheet for Patients: BloggerCourse.com  Fact Sheet for Healthcare Providers: SeriousBroker.it  This test is not yet approved or cleared by the [place] FDA and has been authorized for detection and/or diagnosis of SARS-CoV-2 by FDA under an Emergency Use Authorization (EUA). This EUA will remain in effect (meaning this test can be used) for the duration of the COVID-19 declaration under Section 564(b)(1) of the Act, 21 U.S.C. section 360bbb-3(b)(1), unless the authorization is terminated or revoked.     Resp Syncytial Virus by PCR NEGATIVE NEGATIVE Final    Comment: (NOTE) Fact Sheet for Patients: BloggerCourse.com  Fact Sheet for Healthcare Providers: SeriousBroker.it  This test is not yet approved or cleared by the [place] FDA and has been authorized for detection and/or diagnosis of SARS-CoV-2 by FDA under an Emergency Use Authorization (EUA). This EUA will remain in effect (meaning this test can be used) for the duration of the COVID-19 declaration under Section 564(b)(1) of the Act, 21 U.S.C. section 360bbb-3(b)(1), unless the authorization is terminated or revoked.  Performed at [facility], [address] [place], [place] [address]   Urine Culture     Status: Abnormal   Collection Time: 04/12/23  4:30 PM   Specimen:  Urine, Clean Catch  Result Value Ref Range Status   Specimen Description URINE, CLEAN CATCH  Final   Special Requests NONE Reflexed from [id number]  Final   Culture (A)  Final    <  10,000 COLONIES/mL INSIGNIFICANT GROWTH Performed at [facility], [address], [place], [place] [address]    Report Status 04/13/2023 FINAL  Final     Labs: BNP (last 3 results) Recent Labs    09/10/22 1113  BNP 81.3   Basic Metabolic Panel: Recent Labs  Lab 04/14/23 0440 04/14/23 1034 04/15/23 0047 04/16/23 0345 04/17/23 0617 04/18/23 0237 04/19/23 0624  NA 134*  --  138 137 135 137  --   K 5.6* 4.2 4.0 3.7 4.6 4.6  --   CL 100  --  106 102 101 100  --   CO2 18*  --  25 25 26 30   --   GLUCOSE 80  --  97 91 81 91  --   BUN 18  --  12 6* <5* 11  --   CREATININE 0.74  --  0.63 0.66 0.59 0.61 0.62  CALCIUM 9.2  --  8.8* 8.9 8.6* 9.0  --   MG 1.8  --   --   --   --   --   --   PHOS 2.0*  --   --   --   --   --   --    Liver Function Tests: Recent Labs  Lab 04/12/23 1230  AST 27  ALT 16  ALKPHOS 100  BILITOT 0.5  PROT 8.0  ALBUMIN 3.5   No results for input(s): "LIPASE", "AMYLASE" in the last 168 hours. No results for input(s): "AMMONIA" in the last 168 hours. CBC: Recent Labs  Lab 04/12/23 1230 04/13/23 0532 04/14/23 0440 04/15/23 0047 04/16/23 0345 04/17/23 0617 04/18/23 0237  WBC 13.9*   < > 12.6* 7.6 5.1 7.0 7.0  NEUTROABS 10.4*  --  10.8*  --   --   --   --   HGB 14.0   < > 11.2* 9.9* 11.0* 10.5* 10.1*  HCT 41.7   < > 34.8* 30.0* 33.2* 33.1* 30.8*  MCV 89.9   < > 90.4 87.2 86.9 91.7 88.0  PLT 313   < > 289 330 369 204 405*   < > = values in this interval not displayed.   Cardiac Enzymes: No results for input(s): "CKTOTAL", "CKMB", "CKMBINDEX", "TROPONINI" in the last 168 hours. BNP: Invalid input(s): "POCBNP" CBG: No results for input(s): "GLUCAP" in the last 168 hours. D-Dimer No results for input(s): "DDIMER" in the last 72 hours. Hgb A1c No results for  input(s): "HGBA1C" in the last 72 hours. Lipid Profile No results for input(s): "CHOL", "HDL", "LDLCALC", "TRIG", "CHOLHDL", "LDLDIRECT" in the last 72 hours. Thyroid function studies No results for input(s): "TSH", "T4TOTAL", "T3FREE", "THYROIDAB" in the last 72 hours.  Invalid input(s): "FREET3" Anemia work up No results for input(s): "VITAMINB12", "FOLATE", "FERRITIN", "TIBC", "IRON", "RETICCTPCT" in the last 72 hours. Urinalysis    Component Value Date/Time   COLORURINE YELLOW 04/12/2023 1630   APPEARANCEUR HAZY (A) 04/12/2023 1630   APPEARANCEUR Clear 01/28/2014 1937   LABSPEC 1.017 04/12/2023 1630   LABSPEC 1.015 01/28/2014 1937   PHURINE 6.0 04/12/2023 1630   GLUCOSEU NEGATIVE 04/12/2023 1630   GLUCOSEU Negative 01/28/2014 1937   HGBUR NEGATIVE 04/12/2023 1630   BILIRUBINUR NEGATIVE 04/12/2023 1630   BILIRUBINUR Negative 01/28/2014 1937   KETONESUR NEGATIVE 04/12/2023 1630   PROTEINUR NEGATIVE 04/12/2023 1630   UROBILINOGEN 0.2 03/01/2015 1935   NITRITE POSITIVE (A) 04/12/2023 1630   LEUKOCYTESUR MODERATE (A) 04/12/2023 1630   LEUKOCYTESUR Negative 01/28/2014 1937   Sepsis Labs Recent Labs  Lab 04/15/23 0047 04/16/23 0345 04/17/23 0617 04/18/23 0237  WBC 7.6 5.1 7.0 7.0   Microbiology Recent Results (from the past 240 hour(s))  Blood Culture (routine x 2)     Status: None   Collection Time: 04/12/23 12:30 PM   Specimen: BLOOD RIGHT HAND  Result Value Ref Range Status   Specimen Description BLOOD RIGHT HAND  Final   Special Requests   Final    BOTTLES DRAWN AEROBIC AND ANAEROBIC Blood Culture results may not be optimal due to an excessive volume of blood received in culture bottles   Culture   Final    NO GROWTH 5 DAYS Performed at [facility], [address], [place], [place] [address]    Report Status 04/17/2023 FINAL  Final  Blood Culture (routine x 2)     Status: None   Collection Time: 04/12/23  3:10 PM   Specimen: BLOOD LEFT ARM  Result Value  Ref Range Status   Specimen Description BLOOD LEFT ARM  Final   Special Requests   Final    BOTTLES DRAWN AEROBIC AND ANAEROBIC Blood Culture results may not be optimal due to an excessive volume of blood received in culture bottles   Culture   Final    NO GROWTH 5 DAYS Performed at [facility], [address] [place], [place] [address]    Report Status 04/17/2023 FINAL  Final  Resp panel by RT-PCR (RSV, Flu A&B, Covid) Anterior Nasal Swab     Status: None   Collection Time: 04/12/23  3:33 PM   Specimen: Anterior Nasal Swab  Result Value Ref Range Status   SARS Coronavirus 2 by RT PCR NEGATIVE NEGATIVE Final   Influenza A by PCR NEGATIVE NEGATIVE Final   Influenza B by PCR NEGATIVE NEGATIVE Final    Comment: (NOTE) The Xpert Xpress SARS-CoV-2/FLU/RSV plus assay is intended as an aid in the diagnosis of influenza from Nasopharyngeal swab specimens and should not be used as a sole basis for treatment. Nasal washings and aspirates are unacceptable for Xpert Xpress SARS-CoV-2/FLU/RSV testing.  Fact Sheet for Patients: BloggerCourse.com  Fact Sheet for Healthcare Providers: SeriousBroker.it  This test is not yet approved or cleared by the [place] FDA and has been authorized for detection and/or diagnosis of SARS-CoV-2 by FDA under an Emergency Use Authorization (EUA). This EUA will remain in effect (meaning this test can be used) for the duration of the COVID-19 declaration under Section 564(b)(1) of the Act, 21 U.S.C. section 360bbb-3(b)(1), unless the authorization is terminated or revoked.     Resp Syncytial Virus by PCR NEGATIVE NEGATIVE Final    Comment: (NOTE) Fact Sheet for Patients: BloggerCourse.com  Fact Sheet for Healthcare Providers: SeriousBroker.it  This test is not yet approved or cleared by the [place] FDA and has been authorized for detection  and/or diagnosis of SARS-CoV-2 by FDA under an Emergency Use Authorization (EUA). This EUA will remain in effect (meaning this test can be used) for the duration of the COVID-19 declaration under Section 564(b)(1) of the Act, 21 U.S.C. section 360bbb-3(b)(1), unless the authorization is terminated or revoked.  Performed at [facility], [address] [place], [place] [address]   Urine Culture     Status: Abnormal   Collection Time: 04/12/23  4:30 PM   Specimen: Urine, Clean Catch  Result Value Ref Range Status   Specimen Description URINE, CLEAN CATCH  Final   Special Requests NONE Reflexed from [id number]  Final  Culture (A)  Final    <10,000 COLONIES/mL INSIGNIFICANT GROWTH Performed at [facility], [address], [place], [place] [address]    Report Status 04/13/2023 FINAL  Final     Time coordinating discharge: Over 30 minutes  SIGNED:   Huey Bienenstock, MD  [facility] 04/19/2023, 9:33 AM Pager   If 7PM-7AM, please contact night-coverage [email or web address] Password [id number]

## 2023-04-19 NOTE — TOC Transition Note (Addendum)
Transition of Care [facility]) - CM/SW Discharge Note   Patient Details  Name: Lindsey [name] MRN: [id number] Date of Birth: September 04, 1956  Transition of Care [facility]) CM/SW Contact:  Lawerance Sabal, RN Phone Number: 04/19/2023, 11:28 AM   Clinical Narrative:     Spoke w patient at bedside, confirmed she needed [facility] transport to return home.  She called her daughter on her cell phone, we spoke and she states that she will be home ready to receive her mother when she is discharged, estimated at this afternoon. Waiting to hear back from nurse to call [facility] for transport. Spoke w Lucky Rathke [phone]) CSW and told her I would call her back when patient is ready to be picked up.  Faxed DC note [facility].    11:53 I spoke w [facility] CS[facility] and let her know the patient is ready to be picked up, per her nurse.    Final next level of care: Home/Self Care Barriers to Discharge: No Barriers Identified   Patient Goals and CMS Choice      Discharge Placement                         Discharge Plan and Services Additional resources added to the After Visit Summary for                  DME Arranged: N/A             Date HH Agency Contacted: 04/19/23 Time HH Agency Contacted: 1000 Representative spoke with at [facility] Agency: [facility] CSW [place] [phone])  Social Determinants of Health (SDOH) Interventions SDOH Screenings   Food Insecurity: Food Insecurity Present (04/12/2023)  Housing: High Risk (04/12/2023)  Transportation Needs: Unmet Transportation Needs (04/12/2023)  Utilities: Not At Risk (04/12/2023)  Tobacco Use: Medium Risk (04/12/2023)     Readmission Risk Interventions    09/09/2022   12:02 PM 09/08/2022    1:22 PM 09/12/2020    1:31 PM  Readmission Risk Prevention Plan  Transportation Screening Complete Complete Complete  PCP or Specialist Appt within 5-7 Days Complete Complete Complete  Home Care Screening Complete Complete Complete  Medication Review (RN CM) Complete  Complete Referral to Pharmacy

## 2023-04-19 NOTE — Plan of Care (Signed)
  Problem: Education: Goal: Knowledge of General Education information will improve Description: Including pain rating scale, medication(s)/side effects and non-pharmacologic comfort measures Outcome: Progressing   Problem: Activity: Goal: Risk for activity intolerance will decrease Outcome: Progressing   Problem: Coping: Goal: Level of anxiety will decrease Outcome: Progressing   Problem: Elimination: Goal: Will not experience complications related to bowel motility Outcome: Progressing   Problem: Pain Managment: Goal: General experience of comfort will improve Outcome: Progressing   Problem: Skin Integrity: Goal: Risk for impaired skin integrity will decrease Outcome: Progressing

## 2023-04-19 NOTE — Discharge Instructions (Signed)
Follow with [facility] in 7 days   Get CBC, CMP,  checked  by Primary MD next visit.    Activity: As tolerated with Full fall precautions use walker/cane & assistance as needed   Disposition Home    Diet: Heart Healthy    On your next visit with your primary care physician please Get Medicines reviewed and adjusted.   Please request your Prim.MD to go over all Hospital Tests and Procedure/Radiological results at the follow up, please get all Hospital records sent to your Prim MD by signing hospital release before you go home.   If you experience worsening of your admission symptoms, develop shortness of breath, life threatening emergency, suicidal or homicidal thoughts you must seek medical attention immediately by calling 911 or calling your MD immediately  if symptoms less severe.  You Must read complete instructions/literature along with all the possible adverse reactions/side effects for all the Medicines you take and that have been prescribed to you. Take any new Medicines after you have completely understood and accpet all the possible adverse reactions/side effects.   Do not drive, operating heavy machinery, perform activities at heights, swimming or participation in water activities or provide baby sitting services if your were admitted for syncope or siezures until you have seen by Primary MD or a Neurologist and advised to do so again.  Do not drive when taking Pain medications.    Do not take more than prescribed Pain, Sleep and Anxiety Medications  Special Instructions: If you have smoked or chewed Tobacco  in the last 2 yrs please stop smoking, stop any regular Alcohol  and or any Recreational drug use.  Wear Seat belts while driving.   Please note  You were cared for by a hospitalist during your hospital stay. If you have any questions about your discharge medications or the care you received while you were in the  hospital after you are discharged, you can call the unit and asked to speak with the hospitalist on call if the hospitalist that took care of you is not available. Once you are discharged, your primary care physician will handle any further medical issues. Please note that NO REFILLS for any discharge medications will be authorized once you are discharged, as it is imperative that you return to your primary care physician (or establish a relationship with a primary care physician if you do not have one) for your aftercare needs so that they can reassess your need for medications and monitor your lab values.

## 2023-04-20 ENCOUNTER — Ambulatory Visit: Payer: Medicaid Other

## 2023-04-21 ENCOUNTER — Other Ambulatory Visit: Payer: Self-pay | Admitting: Internal Medicine

## 2023-04-21 DIAGNOSIS — N151 Renal and perinephric abscess: Secondary | ICD-10-CM

## 2023-04-21 DIAGNOSIS — N10 Acute tubulo-interstitial nephritis: Secondary | ICD-10-CM

## 2023-05-04 ENCOUNTER — Ambulatory Visit (HOSPITAL_COMMUNITY)
Admission: RE | Admit: 2023-05-04 | Discharge: 2023-05-04 | Disposition: A | Discharge status: Home / Self Care | Payer: Medicare (Managed Care) | Source: Ambulatory Visit | Attending: Internal Medicine | Admitting: Internal Medicine

## 2023-05-04 ENCOUNTER — Encounter (HOSPITAL_COMMUNITY): Payer: Self-pay

## 2023-05-04 DIAGNOSIS — N151 Renal and perinephric abscess: Secondary | ICD-10-CM

## 2023-05-04 DIAGNOSIS — N10 Acute tubulo-interstitial nephritis: Secondary | ICD-10-CM

## 2023-05-10 ENCOUNTER — Ambulatory Visit (HOSPITAL_COMMUNITY): Payer: Medicaid Other

## 2023-05-10 ENCOUNTER — Ambulatory Visit (HOSPITAL_COMMUNITY)
Admission: RE | Admit: 2023-05-10 | Discharge: 2023-05-10 | Disposition: A | Discharge status: Home / Self Care | Payer: Medicare (Managed Care) | Source: Ambulatory Visit | Attending: Internal Medicine | Admitting: Internal Medicine

## 2023-05-10 DIAGNOSIS — N151 Renal and perinephric abscess: Secondary | ICD-10-CM | POA: Diagnosis present

## 2023-05-10 DIAGNOSIS — N10 Acute tubulo-interstitial nephritis: Secondary | ICD-10-CM | POA: Insufficient documentation

## 2023-05-10 MED ORDER — IOHEXOL 300 MG/ML  SOLN
100.0000 mL | Freq: Once | INTRAMUSCULAR | Status: AC | PRN
  Administered 2023-05-10: 100 mL via INTRAVENOUS

## 2023-05-12 ENCOUNTER — Other Ambulatory Visit: Payer: Medicaid Other

## 2023-06-07 ENCOUNTER — Other Ambulatory Visit (HOSPITAL_COMMUNITY): Payer: Self-pay

## 2023-06-07 ENCOUNTER — Encounter: Payer: Self-pay | Admitting: Gastroenterology

## 2023-06-07 ENCOUNTER — Ambulatory Visit (INDEPENDENT_AMBULATORY_CARE_PROVIDER_SITE_OTHER): Payer: Medicare (Managed Care) | Admitting: Gastroenterology

## 2023-06-07 VITALS — BP 136/78 | HR 76 | Ht 68.0 in | Wt 178.2 lb

## 2023-06-07 DIAGNOSIS — K2100 Gastro-esophageal reflux disease with esophagitis, without bleeding: Secondary | ICD-10-CM

## 2023-06-07 DIAGNOSIS — Z86010 Personal history of colonic polyps: Secondary | ICD-10-CM

## 2023-06-07 MED ORDER — PANTOPRAZOLE SODIUM 40 MG PO TBEC
40.0000 mg | DELAYED_RELEASE_TABLET | Freq: Two times a day (BID) | ORAL | 11 refills | Status: DC
  Filled 2023-06-07: qty 60, 30d supply, fill #0

## 2023-06-07 NOTE — Patient Instructions (Signed)
We have sent the following medications to your pharmacy for you to pick up at your convenience: pantoprazole.   The [facility] providers would like to encourage you to use MYCHART to communicate with providers for non-urgent requests or questions.  Due to long hold times on the telephone, sending your provider a message by MYCHART may be a faster and more efficient way to get a response.  Please allow 48 business hours for a response.  Please remember that this is for non-urgent requests.   Thank you for choosing me and [facility].  Malcolm T. Stark, Jr., MD., FACG   

## 2023-06-07 NOTE — Progress Notes (Signed)
Assessment     GERD with LA grade B esophagitis and an esophageal stricture, active reflux symptoms and solid food dysphagia Personal history of adenomatous colon polyps   Recommendations    Resume pantoprazole at 40 mg bid, follow antireflux measures.  If reflux symptoms and dysphagia do not significantly improve on pantoprazole consider repeat EGD with dilation. Surveillance colonoscopy recommended in June 2026 REV in 1 year   HPI    This is a 67 year old female returning for follow-up of GERD and difficulty swallowing.  She states she is no longer on a medication for acid reflux and her symptoms have worsened with heartburn regurgitation and difficulty swallowing solid foods.  EGD Dec 2023 - LA Grade B reflux esophagitis with no bleeding. Biopsied.  - Benign-appearing esophageal stenosis. Dilated.  - Small hiatal hernia.  - Normal duodenal bulb and second portion of the duodenum.  Colonoscopy Jun 2023 - Three 6 to 11 mm polyps in the transverse colon and in the ascending colon, removed with a cold snare. Resected and retrieved.  - The examination was otherwise normal on direct and retroflexion views.   Labs / Imaging       Latest Ref Rng & Units 04/12/2023   12:30 PM 09/11/2022   10:55 PM 09/10/2022   11:13 AM  Hepatic Function  Total Protein 6.5 - 8.1 g/dL 8.0  7.6  7.1   Albumin 3.5 - 5.0 g/dL 3.5  2.9  2.4   AST 15 - 41 U/L 27  18  16    ALT 0 - 44 U/L 16  14  13    Alk Phosphatase 38 - 126 U/L 100  74  67   Total Bilirubin 0.3 - 1.2 mg/dL 0.5  0.2  0.1        Latest Ref Rng & Units 04/18/2023    2:37 AM 04/17/2023    6:17 AM 04/16/2023    3:45 AM  CBC  WBC 4.0 - 10.5 K/uL 7.0  7.0  5.1   Hemoglobin 12.0 - 15.0 g/dL 93.2  67.1  24.5   Hematocrit 36.0 - 46.0 % 30.8  33.1  33.2   Platelets 150 - 400 K/uL 405  204  369      CT ABDOMEN PELVIS W CONTRAST CLINICAL DATA:  Pyelonephritis.  EXAM: CT ABDOMEN AND PELVIS WITH  CONTRAST  TECHNIQUE: Multidetector CT imaging of the abdomen and pelvis was performed using the standard protocol following bolus administration of intravenous contrast.  RADIATION DOSE REDUCTION: This exam was performed according to the departmental dose-optimization program which includes automated exposure control, adjustment of the mA and/or kV according to patient size and/or use of iterative reconstruction technique.  CONTRAST:  OMNIPAQUE IOHEXOL 300 MG/ML  SOLN  COMPARISON:  04/12/2023  FINDINGS: Lower chest: Lung bases clear.  No pleural or pericardial effusion  Hepatobiliary: There are few tiny hepatic lesions consistent with cysts, too small to definitively characterize by CT. No biliary ductal dilatation. Status post cholecystectomy.  Pancreas: Unremarkable. No pancreatic ductal dilatation or surrounding inflammatory changes.  Spleen: Normal in size without focal abnormality.  Adrenals/Urinary Tract: No adrenal lesions. Right kidney upper pole cyst measures 5 cm. Additional smaller renal cysts bilaterally. No hydronephrosis or nephrolithiasis. No perinephric stranding. Changes of the pyelonephritis observed previously have resolved. Unremarkable urinary bladder.  Stomach/Bowel: Stomach is within normal limits. Appendix has been removed. No evidence of bowel wall thickening, distention, or inflammatory changes.  Vascular/Lymphatic: Aortic atherosclerosis. No enlarged abdominal or pelvic  lymph nodes.  Reproductive: Status post hysterectomy. No adnexal masses.  Other: No abdominal wall hernia or abnormality. No abdominopelvic ascites.  Musculoskeletal: No acute or significant osseous findings.  IMPRESSION: 1. Cysts in the liver and kidneys. 2. Left-sided pyelonephritis identified previously has resolved. 3. No acute abdominal or pelvic pathology.  Electronically Signed   By: Layla Maw M.D.   On: 05/18/2023 15:24   Current Medications,  Allergies, Past Medical History, Past Surgical History, Family History and Social History were reviewed in [employer] record.   Physical Exam: General: Well developed, well nourished, no acute distress Head: Normocephalic and atraumatic Eyes: Sclerae anicteric, EOMI Ears: Normal auditory acuity Mouth: No deformities or lesions noted Lungs: Clear throughout to auscultation Heart: Regular rate and rhythm; No murmurs, rubs or bruits Abdomen: Soft, non tender and non distended. No masses, hepatosplenomegaly or hernias noted. Normal Bowel sounds Rectal: Not done Musculoskeletal: Symmetrical with no gross deformities  Pulses:  Normal pulses noted Extremities: No edema or deformities noted Neurological: Alert oriented x 4, grossly nonfocal Psychological:  Alert and cooperative. Normal mood and affect   Izick Gasbarro T. Russella Dar, MD 06/07/2023, 10:59 AM

## 2023-06-08 ENCOUNTER — Other Ambulatory Visit (HOSPITAL_COMMUNITY): Payer: Self-pay

## 2023-07-04 ENCOUNTER — Other Ambulatory Visit (HOSPITAL_COMMUNITY): Payer: Self-pay

## 2023-08-17 ENCOUNTER — Telehealth: Payer: Self-pay

## 2023-08-17 ENCOUNTER — Other Ambulatory Visit (HOSPITAL_COMMUNITY): Payer: Self-pay

## 2023-08-17 ENCOUNTER — Ambulatory Visit (INDEPENDENT_AMBULATORY_CARE_PROVIDER_SITE_OTHER): Payer: Medicare (Managed Care) | Admitting: Internal Medicine

## 2023-08-17 ENCOUNTER — Encounter: Payer: Self-pay | Admitting: Internal Medicine

## 2023-08-17 VITALS — BP 130/70 | HR 100 | Ht 68.0 in | Wt 175.0 lb

## 2023-08-17 DIAGNOSIS — K222 Esophageal obstruction: Secondary | ICD-10-CM | POA: Diagnosis not present

## 2023-08-17 DIAGNOSIS — R1310 Dysphagia, unspecified: Secondary | ICD-10-CM | POA: Diagnosis not present

## 2023-08-17 DIAGNOSIS — K2100 Gastro-esophageal reflux disease with esophagitis, without bleeding: Secondary | ICD-10-CM

## 2023-08-17 DIAGNOSIS — R1319 Other dysphagia: Secondary | ICD-10-CM

## 2023-08-17 DIAGNOSIS — Z860101 Personal history of adenomatous and serrated colon polyps: Secondary | ICD-10-CM

## 2023-08-17 MED ORDER — PANTOPRAZOLE SODIUM 40 MG PO TBEC
40.0000 mg | DELAYED_RELEASE_TABLET | Freq: Every day | ORAL | 3 refills | Status: DC
  Filled 2023-08-17: qty 90, 90d supply, fill #0

## 2023-08-17 NOTE — Progress Notes (Signed)
HISTORY OF PRESENT ILLNESS:  Lindsey [name] is a pleasant 67 y.o. female with past medical history as listed below who was referred by [facility], regarding dysphagia.  She is a patient of Dr. Ardell Isaacs, but was added onto my schedule as an urgent patient in Dr. Ardell Isaacs absence (as he is working in the hospital this week).  Actually, she was recently seen by Dr. Russella Dar on June 07, 2023 regarding GERD, dysphagia, and a history of adenomatous colon polyps.   The recent office visit with Dr. Russella Dar June 07, 2023.  His assessment as follows: Assessment     GERD with LA grade B esophagitis and an esophageal stricture, active reflux symptoms and solid food dysphagia Personal history of adenomatous colon polyps     Recommendations    Resume pantoprazole at 40 mg bid, follow antireflux measures.  If reflux symptoms and dysphagia do not significantly improve on pantoprazole consider repeat EGD with dilation. Surveillance colonoscopy recommended in June 2026 REV in 1 year     HPI    This is a 67 year old female returning for follow-up of GERD and difficulty swallowing.  She states she is no longer on a medication for acid reflux and her symptoms have worsened with heartburn regurgitation and difficulty swallowing solid foods.   EGD Dec 2023 - LA Grade B reflux esophagitis with no bleeding. Biopsied.  - Benign-appearing esophageal stenosis. Dilated.  - Small hiatal hernia.  - Normal duodenal bulb and second portion of the duodenum.   Colonoscopy Jun 2023 - Three 6 to 11 mm polyps in the transverse colon and in the ascending colon, removed with a cold snare. Resected and retrieved.  - The examination was otherwise normal on direct and retroflexion views.   The patient arrives unaccompanied.  Fair historian.  She she reports intermittent dysphagia with solids.  She is a dentulous.  Prior esophagram revealed distal esophageal stricture.  As a hospital inpatient, she underwent  upper endoscopy with Dr. Russella Dar June 15, 2022.  She was found to have esophagitis and a distal esophageal stricture which was dilated with a 16 mm Maloney dilator.  Patient states that she feels that this helped.  Problems have been worse over the past 6 months.  She cannot confirm that she has been compliant with we had a PPI.  She invites [place] to talk with her daughter telephone.  Other medical problems are stable.  Last ejection fraction 55 to 60%.  No evidence for airway difficulties.  Not on oxygen, GLP-1 agonist or anticoagulants.    REVIEW OF SYSTEMS:  All non-GI ROS negative except for arthritis  Past Medical History:  Diagnosis Date   Arthritis    Asthma    Drug abuse (HCC)    recovered   GERD (gastroesophageal reflux disease)    H/O ETOH abuse    HTN (hypertension)    Seizure (HCC)     Past Surgical History:  Procedure Laterality Date   ABDOMINAL HYSTERECTOMY     BIOPSY  09/14/2022   Procedure: BIOPSY;  Surgeon: Meryl Dare, MD;  Location: [facility];  Service: Gastroenterology;;   COLONOSCOPY WITH PROPOFOL N/A 03/18/2022   Procedure: COLONOSCOPY WITH PROPOFOL;  Surgeon: Rachael Fee, MD;  Location: [facility];  Service: Gastroenterology;  Laterality: N/A;   ESOPHAGOGASTRODUODENOSCOPY (EGD) WITH PROPOFOL N/A 09/14/2022   Procedure: ESOPHAGOGASTRODUODENOSCOPY (EGD) WITH PROPOFOL;  Surgeon: Meryl Dare, MD;  Location: [facility];  Service: Gastroenterology;  Laterality: N/A;   GALLBLADDER SURGERY  peptic ulcer surgery     POLYPECTOMY  03/18/2022   Procedure: POLYPECTOMY;  Surgeon: Rachael Fee, MD;  Location: [facility];  Service: Gastroenterology;;   Gaspar Bidding DILATION N/A 09/14/2022   Procedure: Gaspar Bidding DILATION;  Surgeon: Meryl Dare, MD;  Location: [facility];  Service: Gastroenterology;  Laterality: N/A;    Social History Runette Sontag  reports that she has quit smoking. Her smoking use included cigarettes. She has never used  smokeless tobacco. She reports that she does not currently use drugs after having used the following drugs: Marijuana. She reports that she does not drink alcohol.  family history includes Alcohol abuse in her mother; Cancer in her daughter; Diabetes in her daughter; Drug abuse in her brother; Heart attack in her brother and father; Hypertension in her daughter; Pancreatic cancer in her brother; Seizures in her mother.  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    Ibuprofen Swelling   Kiwi Extract Hives and Itching   Morphine And Codeine Hives   Penicillins Hives and Other (See Comments)    Has patient had a PCN reaction causing immediate rash, facial/tongue/throat swelling, SOB or lightheadedness with hypotension: Yes Has patient had a PCN reaction causing severe rash involving mucus membranes or skin necrosis: No Has patient had a PCN reaction that required hospitalization No Has patient had a PCN reaction occurring within the last 10 years: No If all of the above answers are "NO", then may proceed with Cephalosporin use.   Strawberry Extract Hives and Itching       PHYSICAL EXAMINATION: Vital signs: BP 130/70 (BP Location: Left Arm, Patient Position: Sitting, Cuff Size: Normal)   Pulse 100   Ht 5\' 8"  (1.727 m)   Wt 175 lb (79.4 kg)   SpO2 96%   BMI 26.61 kg/m  General: Pleasant, well-developed, well-nourished, no acute distress HEENT: Sclerae are anicteric, conjunctiva pink. Oral mucosa intact.  Edentulous Lungs: Clear Heart: Regular Abdomen: soft, nontender, nondistended, no obvious ascites, no peritoneal signs, normal bowel sounds. No organomegaly. Extremities: No clubbing, cyanosis, or edema Psychiatric: alert and oriented x3. Cooperative  ASSESSMENT:  1.   History of GERD complicated by esophagitis and peptic stricture with prior esophageal dilation. 2.  Recurrent intermittent solid food dysphagia.  Likely secondary to distal esophageal stricture recurrence exacerbated by edentulous state 3.  General Medical problems.  Stable.  Hospitalization 4 months ago with pyelonephritis.  Resolved.   PLAN:  1.  Reflux precautions 2.  Continue pantoprazole 40 mg twice daily.  Will confirm with the patient's daughter that she is compliant. 3.  Schedule upper endoscopy with esophageal dilation with Dr. Russella Dar in the [facility].The nature of the procedure, as well as the risks, benefits, and alternatives were carefully and thoroughly reviewed with the patient AND HER DAUGHTER. Ample time for discussion and questions allowed. The patient understood, was satisfied, and agreed to proceed. 4.  Ongoing general medical care with her PCP A total time of 40 minutes was spent preparing to see the patient, reviewing the myriad of records and data, obtaining comprehensive history, performing medically appropriate physical examination, counseling and educating the patient and her daughter regarding the above listed issues, ordering therapeutic endoscopic procedure, and documenting clinical information in the health record.

## 2023-08-17 NOTE — Patient Instructions (Signed)
We have sent the following medications to your pharmacy for you to pick up at your convenience:  Protonix - twice a day.  You have been scheduled for an endoscopy. Please follow written instructions given to you at your visit today.  If you use inhalers (even only as needed), please bring them with you on the day of your procedure.  If you take any of the following medications, they will need to be adjusted prior to your procedure:   DO NOT TAKE 7 DAYS PRIOR TO TEST- Trulicity (dulaglutide) Ozempic, Wegovy (semaglutide) Mounjaro (tirzepatide) Bydureon Bcise (exanatide extended release)  DO NOT TAKE 1 DAY PRIOR TO YOUR TEST Rybelsus (semaglutide) Adlyxin (lixisenatide) Victoza (liraglutide) Byetta (exanatide) ___________________________________________________________________________

## 2023-08-17 NOTE — Telephone Encounter (Signed)
Spoke with daughter and confirmed the date and time for egd.  She is going to have [facility] bring pt for her procedure but her boyfriend will accompany them.  I told her I would send pt's instructions home with pt so daughter could review them with patient as patient seems to have some cognitive difficulties

## 2023-09-20 ENCOUNTER — Encounter: Payer: Medicare (Managed Care) | Admitting: Gastroenterology

## 2023-09-23 ENCOUNTER — Encounter: Payer: Self-pay | Admitting: Gastroenterology

## 2023-09-23 ENCOUNTER — Ambulatory Visit (AMBULATORY_SURGERY_CENTER): Payer: Medicare (Managed Care) | Admitting: Gastroenterology

## 2023-09-23 VITALS — BP 142/75 | HR 71 | Temp 97.5°F | Resp 28 | Ht 68.0 in | Wt 175.0 lb

## 2023-09-23 DIAGNOSIS — R1310 Dysphagia, unspecified: Secondary | ICD-10-CM

## 2023-09-23 DIAGNOSIS — K222 Esophageal obstruction: Secondary | ICD-10-CM

## 2023-09-23 DIAGNOSIS — K449 Diaphragmatic hernia without obstruction or gangrene: Secondary | ICD-10-CM

## 2023-09-23 DIAGNOSIS — K2100 Gastro-esophageal reflux disease with esophagitis, without bleeding: Secondary | ICD-10-CM

## 2023-09-23 MED ORDER — SODIUM CHLORIDE 0.9 % IV SOLN: 500.0000 mL | Freq: Once | INTRAVENOUS | Status: DC

## 2023-09-23 NOTE — Patient Instructions (Signed)
    Clear liquid diet for 2 hours then advance to soft foods rest of today- due to dilatation of your esophagus today  Resume prior diet tomorrow as tolerated   Follow anti reflux instructions  Continue present medications    GI follow up as needed    YOU HAD AN ENDOSCOPIC PROCEDURE TODAY AT THE [facility]:   Refer to the procedure report that was given to you for any specific questions about what was found during the examination.  If the procedure report does not answer your questions, please call your gastroenterologist to clarify.  If you requested that your care partner not be given the details of your procedure findings, then the procedure report has been included in a sealed envelope for you to review at your convenience later.  YOU SHOULD EXPECT: Some feelings of bloating in the abdomen. Passage of more gas than usual.  Walking can help get rid of the air that was put into your GI tract during the procedure and reduce the bloating. If you had a lower endoscopy (such as a colonoscopy or flexible sigmoidoscopy) you may notice spotting of blood in your stool or on the toilet paper. If you underwent a bowel prep for your procedure, you may not have a normal bowel movement for a few days.  Please Note:  You might notice some irritation and congestion in your nose or some drainage.  This is from the oxygen used during your procedure.  There is no need for concern and it should clear up in a day or so.  SYMPTOMS TO REPORT IMMEDIATELY:    Following upper endoscopy (EGD)  Vomiting of blood or coffee ground material  New chest pain or pain under the shoulder blades  Painful or persistently difficult swallowing  New shortness of breath  Fever of 100F or higher  Black, tarry-looking stools  For urgent or emergent issues, a gastroenterologist can be reached at any hour by calling [phone]. Do not use MyChart messaging for urgent concerns.    DIET:  We do recommend a  small meal at first, but then you may proceed to your regular diet.  Drink plenty of fluids but you should avoid alcoholic beverages for 24 hours.  ACTIVITY:  You should plan to take it easy for the rest of today and you should NOT DRIVE or use heavy machinery until tomorrow (because of the sedation medicines used during the test).    FOLLOW UP: Our staff will call the number listed on your records the next business day following your procedure.  We will call around 7:15- 8:00 am to check on you and address any questions or concerns that you may have regarding the information given to you following your procedure. If we do not reach you, we will leave a message.     If any biopsies were taken you will be contacted by phone or by letter within the next 1-3 weeks.  Please call us at [phone] if you have not heard about the biopsies in 3 weeks.    SIGNATURES/CONFIDENTIALITY: You and/or your care partner have signed paperwork which will be entered into your electronic medical record.  These signatures attest to the fact that that the information above on your After Visit Summary has been reviewed and is understood.  Full responsibility of the confidentiality of this discharge information lies with you and/or your care-partner.

## 2023-09-23 NOTE — Progress Notes (Signed)
History & Physical  Primary Care Physician:  [facility] Primary Gastroenterologist: Claudette Head, MD  Impression / Plan:  Dysphagia, GERD, history of esophageal stricture for EGD possible dilation.  CHIEF COMPLAINT: Dysphagia, GERD, history of esophageal stricture  HPI: Lindsey [name] is a 67 y.o. female with dysphagia, GERD, history of esophageal stricture for EGD possible dilation.   Past Medical History:  Diagnosis Date   Arthritis    Asthma    Drug abuse (HCC)    recovered   GERD (gastroesophageal reflux disease)    H/O ETOH abuse    HTN (hypertension)    Seizure (HCC)     Past Surgical History:  Procedure Laterality Date   ABDOMINAL HYSTERECTOMY     BIOPSY  09/14/2022   Procedure: BIOPSY;  Surgeon: Meryl Dare, MD;  Location: [facility];  Service: Gastroenterology;;   COLONOSCOPY WITH PROPOFOL N/A 03/18/2022   Procedure: COLONOSCOPY WITH PROPOFOL;  Surgeon: Rachael Fee, MD;  Location: [facility];  Service: Gastroenterology;  Laterality: N/A;   ESOPHAGOGASTRODUODENOSCOPY (EGD) WITH PROPOFOL N/A 09/14/2022   Procedure: ESOPHAGOGASTRODUODENOSCOPY (EGD) WITH PROPOFOL;  Surgeon: Meryl Dare, MD;  Location: [facility];  Service: Gastroenterology;  Laterality: N/A;   GALLBLADDER SURGERY     peptic ulcer surgery     POLYPECTOMY  03/18/2022   Procedure: POLYPECTOMY;  Surgeon: Rachael Fee, MD;  Location: [facility];  Service: Gastroenterology;;   Gaspar Bidding DILATION N/A 09/14/2022   Procedure: Gaspar Bidding DILATION;  Surgeon: Meryl Dare, MD;  Location: [facility];  Service: Gastroenterology;  Laterality: N/A;    Prior to Admission medications   Medication Sig Start Date End Date Taking? Authorizing Provider  divalproex (DEPAKOTE ER) 500 MG 24 hr tablet Take 500 mg by mouth in the morning and at bedtime.   Yes [provider]  levETIRAcetam (KEPPRA) 750 MG tablet Take 1 tablet (750 mg total) by mouth 2  (two) times daily. 07/20/19  Yes Raeford Razor, MD  albuterol (VENTOLIN HFA) 108 (90 Base) MCG/ACT inhaler Inhale 2 puffs into the lungs 2 (two) times daily as needed for wheezing or shortness of breath.    [provider]  hydrochlorothiazide (HYDRODIURIL) 12.5 MG tablet Take 12.5 mg by mouth daily. Patient not taking: Reported on 08/17/2023    [provider]  losartan (COZAAR) 50 MG tablet Take 50 mg by mouth daily. Patient not taking: Reported on 08/17/2023    [provider]  pantoprazole (PROTONIX) 40 MG tablet Take 1 tablet (40 mg total) by mouth 2 (two) times daily. 06/07/23   Meryl Dare, MD  pantoprazole (PROTONIX) 40 MG tablet Take 1 tablet (40 mg total) by mouth daily. 08/17/23   Hilarie Fredrickson, MD  rosuvastatin (CRESTOR) 10 MG tablet Take 10 mg by mouth daily. Patient not taking: Reported on 08/17/2023    [provider]  senna-docusate (SENOKOT-S) 8.6-50 MG tablet Take 1 tablet by mouth daily as needed for mild constipation. Patient not taking: Reported on 09/23/2023    [provider]  TYLENOL 8 HOUR ARTHRITIS PAIN 650 MG CR tablet Take 650-1,300 mg by mouth 2 (two) times daily. Patient not taking: Reported on 08/17/2023    [provider]    Current Outpatient Medications  Medication Sig Dispense Refill   divalproex (DEPAKOTE ER) 500 MG 24 hr tablet Take 500 mg by mouth in the morning and at bedtime.     levETIRAcetam (KEPPRA) 750 MG tablet Take 1 tablet (750 mg  total) by mouth 2 (two) times daily. 60 tablet 0   albuterol (VENTOLIN HFA) 108 (90 Base) MCG/ACT inhaler Inhale 2 puffs into the lungs 2 (two) times daily as needed for wheezing or shortness of breath.     hydrochlorothiazide (HYDRODIURIL) 12.5 MG tablet Take 12.5 mg by mouth daily. (Patient not taking: Reported on 08/17/2023)     losartan (COZAAR) 50 MG tablet Take 50 mg by mouth daily. (Patient not taking: Reported on 08/17/2023)     pantoprazole (PROTONIX) 40  MG tablet Take 1 tablet (40 mg total) by mouth 2 (two) times daily. 60 tablet 11   pantoprazole (PROTONIX) 40 MG tablet Take 1 tablet (40 mg total) by mouth daily. 90 tablet 3   rosuvastatin (CRESTOR) 10 MG tablet Take 10 mg by mouth daily. (Patient not taking: Reported on 08/17/2023)     senna-docusate (SENOKOT-S) 8.6-50 MG tablet Take 1 tablet by mouth daily as needed for mild constipation. (Patient not taking: Reported on 09/23/2023)     TYLENOL 8 HOUR ARTHRITIS PAIN 650 MG CR tablet Take 650-1,300 mg by mouth 2 (two) times daily. (Patient not taking: Reported on 08/17/2023)     Current Facility-Administered Medications  Medication Dose Route Frequency Provider Last Rate Last Admin   0.9 %  sodium chloride infusion  500 mL Intravenous Once Meryl Dare, MD        Allergies as of 09/23/2023 - Review Complete 09/23/2023  Allergen Reaction Noted   Iodinated contrast media Shortness Of Breath and Itching 04/12/2023   Oxycodone Rash 04/13/2023   Ibuprofen Swelling 04/17/2023   Kiwi extract Hives and Itching 11/03/2015   Morphine and codeine Hives 11/05/2013   Penicillins Hives and Other (See Comments) 02/25/2012   Strawberry extract Hives and Itching 04/25/2015    Family History  Problem Relation Age of Onset   Seizures Mother    Alcohol abuse Mother    Heart attack Father    Drug abuse Brother    Heart attack Brother    Pancreatic cancer Brother    Cancer Daughter        type unknown   Diabetes Daughter    Hypertension Daughter     Social History   Socioeconomic History   Marital status: Legally Separated    Spouse name: Not on file   Number of children: 2   Years of education: 12   Highest education level: Not on file  Occupational History   Occupation: Disability  Tobacco Use   Smoking status: Former    Types: Cigarettes   Smokeless tobacco: Never  Vaping Use   Vaping status: Never Used  Substance and Sexual Activity   Alcohol use: No    Comment: Hx of  alchol abuse, not currently drinking   Drug use: Not Currently    Types: Marijuana    Comment: "every now and then"   Sexual activity: Not on file  Other Topics Concern   Not on file  Social History Narrative   Lives at home w/ her daughter   Right-handed   Caffeine: several coffees and sodas daily   Social Drivers of Corporate investment banker Strain: Not on file  Food Insecurity: Food Insecurity Present (04/12/2023)   Hunger Vital Sign    Worried About Running Out of Food in the Last Year: Sometimes true    Ran Out of Food in the Last Year: Sometimes true  Transportation Needs: Unmet Transportation Needs (04/12/2023)   PRAPARE - Transportation    Lack of  Transportation (Medical): Yes    Lack of Transportation (Non-Medical): No  Physical Activity: Not on file  Stress: Not on file  Social Connections: Not on file  Intimate Partner Violence: Not At Risk (04/12/2023)   Humiliation, Afraid, Rape, and Kick questionnaire    Fear of Current or Ex-Partner: No    Emotionally Abused: No    Physically Abused: No    Sexually Abused: No    Review of Systems:  All systems reviewed were negative except where noted in HPI.   Physical Exam:  General:  Alert, well-developed, in NAD Head:  Normocephalic and atraumatic. Eyes:  Sclera clear, no icterus.   Conjunctiva pink. Ears:  Normal auditory acuity. Mouth:  No deformity or lesions.  Neck:  Supple; no masses. Lungs:  Clear throughout to auscultation.   No wheezes, crackles, or rhonchi.  Heart:  Regular rate and rhythm; no murmurs. Abdomen:  Soft, nondistended, nontender. No masses, hepatomegaly. No palpable masses.  Normal bowel sounds.    Rectal:  Deferred   Msk:  Symmetrical without gross deformities. Extremities:  Without edema. Neurologic:  Alert and  oriented x 4; grossly normal neurologically. Skin:  Intact without significant lesions or rashes. Psych:  Alert and cooperative. Normal mood and affect.   Venita Lick. Russella Dar   09/23/2023, 11:34 AM See Loretha Stapler, [facility], to contact our on call provider

## 2023-09-23 NOTE — Progress Notes (Signed)
Pt's states no medical or surgical changes since previsit or office visit. 

## 2023-09-23 NOTE — Op Note (Signed)
[facility] Patient Name: Declan Kinstler Procedure Date: 09/23/2023 11:17 AM MRN: [id number] Endoscopist: Meryl Dare , MD, [phone] Age: 67 Referring MD:  Date of Birth: [birth year] Gender: Female Account #: [id number] Procedure:                Upper GI endoscopy Indications:              Dysphagia, Gastroesophageal reflux disease Medicines:                Monitored Anesthesia Care Procedure:                Pre-Anesthesia Assessment:                           - Prior to the procedure, a History and Physical                            was performed, and patient medications and                            allergies were reviewed. The patient's tolerance of                            previous anesthesia was also reviewed. The risks                            and benefits of the procedure and the sedation                            options and risks were discussed with the patient.                            All questions were answered, and informed consent                            was obtained. Prior Anticoagulants: The patient has                            taken no anticoagulant or antiplatelet agents. ASA                            Grade Assessment: III - A patient with severe                            systemic disease. After reviewing the risks and                            benefits, the patient was deemed in satisfactory                            condition to undergo the procedure.                           After obtaining informed consent, the endoscope was  passed under direct vision. Throughout the                            procedure, the patient's blood pressure, pulse, and                            oxygen saturations were monitored continuously. The                            GIF [id number] #[id number] was introduced through the                            mouth, and advanced to the second part of duodenum.                            The  upper GI endoscopy was accomplished without                            difficulty. The patient tolerated the procedure                            well. Scope In: Scope Out: Findings:                 One benign-appearing, intrinsic mild stenosis was                            found at the gastroesophageal junction. This                            stenosis measured 1.4 cm (inner diameter) x less                            than one cm (in length). The stenosis was                            traversed. A guidewire was placed and the scope was                            withdrawn. Dilation was performed with a Savary                            dilator with mild resistance at 17 mm.                           The exam of the esophagus was otherwise normal.                           A small hiatal hernia was present.                           The gastroesophageal flap valve was visualized                            endoscopically and  classified as Hill Grade II                            (fold present, opens with respiration).                           The exam of the stomach was otherwise normal.                           The duodenal bulb and second portion of the                            duodenum were normal. Complications:            No immediate complications. Estimated Blood Loss:     Estimated blood loss: none. Impression:               - Benign-appearing esophageal stenosis. Dilated.                           - Small hiatal hernia.                           - Gastroesophageal flap valve classified as Hill                            Grade II (fold present, opens with respiration).                           - Normal duodenal bulb and second portion of the                            duodenum.                           - No specimens collected. Recommendation:           - Patient has a contact number available for                            emergencies. The signs and symptoms of potential                             delayed complications were discussed with the                            patient. Return to normal activities tomorrow.                            Written discharge instructions were provided to the                            patient.                           - Clear liquid diet for 2 hours, then advance as  tolerated to soft diet today.                           - Resume prior diet tomorrow.                           - Follow antireflux measures.                           - Continue present medications.                           - GI follow up prn. Meryl Dare, MD 09/23/2023 11:46:38 AM This report has been signed electronically.

## 2023-09-23 NOTE — Progress Notes (Signed)
Sedate, gd SR, tolerated procedure well, VSS, report to RN 

## 2023-09-26 ENCOUNTER — Telehealth: Payer: Self-pay | Admitting: *Deleted

## 2023-09-26 NOTE — Telephone Encounter (Signed)
Left message on f/u call 

## 2024-01-18 ENCOUNTER — Other Ambulatory Visit: Payer: Self-pay | Admitting: Student

## 2024-01-18 DIAGNOSIS — F17210 Nicotine dependence, cigarettes, uncomplicated: Secondary | ICD-10-CM

## 2024-02-02 ENCOUNTER — Ambulatory Visit
Admission: RE | Admit: 2024-02-02 | Discharge: 2024-02-02 | Disposition: A | Discharge status: Home / Self Care | Payer: Medicare (Managed Care) | Source: Ambulatory Visit | Attending: Student | Admitting: Student

## 2024-02-02 DIAGNOSIS — F17210 Nicotine dependence, cigarettes, uncomplicated: Secondary | ICD-10-CM

## 2024-02-14 ENCOUNTER — Encounter: Payer: Self-pay | Admitting: Student

## 2024-02-15 ENCOUNTER — Other Ambulatory Visit: Payer: Self-pay | Admitting: Student

## 2024-02-15 ENCOUNTER — Encounter: Payer: Self-pay | Admitting: Student

## 2024-02-15 DIAGNOSIS — R1032 Left lower quadrant pain: Secondary | ICD-10-CM

## 2024-02-16 ENCOUNTER — Encounter: Payer: Self-pay | Admitting: Family Medicine

## 2024-02-16 DIAGNOSIS — Z0001 Encounter for general adult medical examination with abnormal findings: Secondary | ICD-10-CM

## 2024-02-17 ENCOUNTER — Ambulatory Visit
Admission: RE | Admit: 2024-02-17 | Discharge: 2024-02-17 | Disposition: A | Discharge status: Home / Self Care | Source: Ambulatory Visit

## 2024-02-17 DIAGNOSIS — Z0001 Encounter for general adult medical examination with abnormal findings: Secondary | ICD-10-CM

## 2024-02-19 ENCOUNTER — Emergency Department (HOSPITAL_COMMUNITY)

## 2024-02-19 ENCOUNTER — Encounter (HOSPITAL_COMMUNITY): Payer: Self-pay

## 2024-02-19 ENCOUNTER — Emergency Department (HOSPITAL_COMMUNITY)
Admission: EM | Admit: 2024-02-19 | Discharge: 2024-02-19 | Disposition: A | Discharge status: Home / Self Care | Attending: Emergency Medicine | Admitting: Emergency Medicine

## 2024-02-19 ENCOUNTER — Other Ambulatory Visit: Payer: Self-pay

## 2024-02-19 DIAGNOSIS — Z79899 Other long term (current) drug therapy: Secondary | ICD-10-CM | POA: Insufficient documentation

## 2024-02-19 DIAGNOSIS — I10 Essential (primary) hypertension: Secondary | ICD-10-CM | POA: Diagnosis not present

## 2024-02-19 DIAGNOSIS — J449 Chronic obstructive pulmonary disease, unspecified: Secondary | ICD-10-CM | POA: Diagnosis not present

## 2024-02-19 DIAGNOSIS — M7989 Other specified soft tissue disorders: Secondary | ICD-10-CM | POA: Insufficient documentation

## 2024-02-19 DIAGNOSIS — M79606 Pain in leg, unspecified: Secondary | ICD-10-CM

## 2024-02-19 DIAGNOSIS — M79604 Pain in right leg: Secondary | ICD-10-CM | POA: Diagnosis present

## 2024-02-19 DIAGNOSIS — R9082 White matter disease, unspecified: Secondary | ICD-10-CM | POA: Diagnosis not present

## 2024-02-19 DIAGNOSIS — W19XXXA Unspecified fall, initial encounter: Secondary | ICD-10-CM | POA: Diagnosis not present

## 2024-02-19 LAB — COMPREHENSIVE METABOLIC PANEL WITH GFR
ALT: 7 U/L (ref 0–44)
AST: 16 U/L (ref 15–41)
Albumin: 3.3 g/dL — ABNORMAL LOW (ref 3.5–5.0)
Alkaline Phosphatase: 67 U/L (ref 38–126)
Anion gap: 6 (ref 5–15)
BUN: 12 mg/dL (ref 8–23)
CO2: 33 mmol/L — ABNORMAL HIGH (ref 22–32)
Calcium: 9.1 mg/dL (ref 8.9–10.3)
Chloride: 103 mmol/L (ref 98–111)
Creatinine, Ser: 0.91 mg/dL (ref 0.44–1.00)
GFR, Estimated: >60 mL/min (ref >60)
Glucose, Bld: 84 mg/dL (ref 70–99)
Potassium: 3.4 mmol/L — ABNORMAL LOW (ref 3.5–5.1)
Sodium: 142 mmol/L (ref 135–145)
Total Bilirubin: 0.2 mg/dL (ref 0.0–1.2)
Total Protein: 6.9 g/dL (ref 6.5–8.1)

## 2024-02-19 LAB — CBC
HCT: 39.7 % (ref 36.0–46.0)
Hemoglobin: 12.6 g/dL (ref 12.0–15.0)
MCH: 29.1 pg (ref 26.0–34.0)
MCHC: 31.7 g/dL (ref 30.0–36.0)
MCV: 91.7 fL (ref 80.0–100.0)
Platelets: 387 10*3/uL (ref 150–400)
RBC: 4.33 MIL/uL (ref 3.87–5.11)
RDW: 13.2 % (ref 11.5–15.5)
WBC: 6.1 10*3/uL (ref 4.0–10.5)
nRBC: 0 % (ref 0.0–0.2)

## 2024-02-19 LAB — ETHANOL: Alcohol, Ethyl (B): <15 mg/dL (ref <15)
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
## 2024-02-19 NOTE — Discharge Instructions (Signed)
 Follow-up with your doctor to be rechecked.  Continue your home medications.  Return to the ED for worsening or recurrent symptoms.

## 2024-02-19 NOTE — ED Notes (Signed)
 The patient requested assistance unlocking her cell phone for use.  I assisted her with this task.
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
## 2024-03-01 ENCOUNTER — Emergency Department (HOSPITAL_COMMUNITY)
Admission: EM | Admit: 2024-03-01 | Discharge: 2024-03-01 | Disposition: A | Discharge status: Home / Self Care | Attending: Emergency Medicine | Admitting: Emergency Medicine

## 2024-03-01 ENCOUNTER — Other Ambulatory Visit: Payer: Self-pay

## 2024-03-01 ENCOUNTER — Encounter (HOSPITAL_COMMUNITY): Payer: Self-pay | Admitting: *Deleted

## 2024-03-01 ENCOUNTER — Emergency Department (HOSPITAL_COMMUNITY)

## 2024-03-01 DIAGNOSIS — R531 Weakness: Secondary | ICD-10-CM | POA: Diagnosis present

## 2024-03-01 DIAGNOSIS — R9431 Abnormal electrocardiogram [ECG] [EKG]: Secondary | ICD-10-CM | POA: Diagnosis not present

## 2024-03-01 DIAGNOSIS — Z79899 Other long term (current) drug therapy: Secondary | ICD-10-CM | POA: Diagnosis not present

## 2024-03-01 DIAGNOSIS — I10 Essential (primary) hypertension: Secondary | ICD-10-CM | POA: Insufficient documentation

## 2024-03-01 DIAGNOSIS — I6782 Cerebral ischemia: Secondary | ICD-10-CM | POA: Insufficient documentation

## 2024-03-01 LAB — I-STAT CHEM 8, ED
BUN: 23 mg/dL (ref 8–23)
Calcium, Ion: 1.03 mmol/L — ABNORMAL LOW (ref 1.15–1.40)
Chloride: 104 mmol/L (ref 98–111)
Creatinine, Ser: 0.8 mg/dL (ref 0.44–1.00)
Glucose, Bld: 84 mg/dL (ref 70–99)
HCT: 45 % (ref 36.0–46.0)
Hemoglobin: 15.3 g/dL — ABNORMAL HIGH (ref 12.0–15.0)
Potassium: 4 mmol/L (ref 3.5–5.1)
Sodium: 137 mmol/L (ref 135–145)
TCO2: 25 mmol/L (ref 22–32)

## 2024-03-01 LAB — CBC
HCT: 40.3 % (ref 36.0–46.0)
Hemoglobin: 13 g/dL (ref 12.0–15.0)
MCH: 29.1 pg (ref 26.0–34.0)
MCHC: 32.3 g/dL (ref 30.0–36.0)
MCV: 90.4 fL (ref 80.0–100.0)
Platelets: 326 10*3/uL (ref 150–400)
RBC: 4.46 MIL/uL (ref 3.87–5.11)
RDW: 13.2 % (ref 11.5–15.5)
WBC: 9.1 10*3/uL (ref 4.0–10.5)
nRBC: 0 % (ref 0.0–0.2)

## 2024-03-01 LAB — DIFFERENTIAL
Abs Immature Granulocytes: 0.04 10*3/uL (ref 0.00–0.07)
Basophils Absolute: 0 10*3/uL (ref 0.0–0.1)
Basophils Relative: 0 %
Eosinophils Absolute: 0.1 10*3/uL (ref 0.0–0.5)
Eosinophils Relative: 1 %
Immature Granulocytes: 0 %
Lymphocytes Relative: 29 %
Lymphs Abs: 2.6 10*3/uL (ref 0.7–4.0)
Monocytes Absolute: 0.7 10*3/uL (ref 0.1–1.0)
Monocytes Relative: 8 %
Neutro Abs: 5.6 10*3/uL (ref 1.7–7.7)
Neutrophils Relative %: 62 %

## 2024-03-01 LAB — COMPREHENSIVE METABOLIC PANEL WITH GFR
ALT: 10 U/L (ref 0–44)
AST: 17 U/L (ref 15–41)
Albumin: 3.4 g/dL — ABNORMAL LOW (ref 3.5–5.0)
Alkaline Phosphatase: 86 U/L (ref 38–126)
Anion gap: 9 (ref 5–15)
BUN: 18 mg/dL (ref 8–23)
CO2: 30 mmol/L (ref 22–32)
Calcium: 9.4 mg/dL (ref 8.9–10.3)
Chloride: 100 mmol/L (ref 98–111)
Creatinine, Ser: 0.83 mg/dL (ref 0.44–1.00)
GFR, Estimated: >60 mL/min (ref >60)
Glucose, Bld: 99 mg/dL (ref 70–99)
Potassium: 3.4 mmol/L — ABNORMAL LOW (ref 3.5–5.1)
Sodium: 139 mmol/L (ref 135–145)
Total Bilirubin: 0.3 mg/dL (ref 0.0–1.2)
Total Protein: 7.2 g/dL (ref 6.5–8.1)

## 2024-03-01 LAB — PROTIME-INR
INR: 1 (ref 0.8–1.2)
Prothrombin Time: 13.2 s (ref 11.4–15.2)

## 2024-03-01 LAB — APTT: aPTT: 30 s (ref 24–36)

## 2024-03-01 LAB — ETHANOL: Alcohol, Ethyl (B): <15 mg/dL (ref <15)

## 2024-03-01 MED ORDER — SODIUM CHLORIDE 0.9% FLUSH
3.0000 mL | Freq: Once | INTRAVENOUS | Status: AC
  Administered 2024-03-01: 3 mL via INTRAVENOUS

## 2024-03-01 MED ORDER — DIPHENHYDRAMINE HCL 50 MG/ML IJ SOLN
25.0000 mg | Freq: Once | INTRAMUSCULAR | Status: AC
  Administered 2024-03-01: 25 mg via INTRAVENOUS
  Filled 2024-03-01: qty 1

## 2024-03-01 MED ORDER — METHYLPREDNISOLONE SODIUM SUCC 125 MG IJ SOLR
125.0000 mg | Freq: Once | INTRAMUSCULAR | Status: AC
  Administered 2024-03-01: 125 mg via INTRAVENOUS
  Filled 2024-03-01: qty 2

## 2024-03-01 MED ORDER — PROCHLORPERAZINE EDISYLATE 10 MG/2ML IJ SOLN
10.0000 mg | INTRAMUSCULAR | Status: AC
  Administered 2024-03-01: 10 mg via INTRAVENOUS
  Filled 2024-03-01: qty 2

## 2024-03-01 NOTE — ED Notes (Signed)
Pt was taken to CT

## 2024-03-01 NOTE — Discharge Instructions (Signed)
 Follow up with your family doc in the office.  I have place a referral for you to be seen in the neurologists office. Please return for worsening symptoms.

## 2024-03-01 NOTE — ED Notes (Signed)
 [facility] called, second in line

## 2024-03-01 NOTE — ED Notes (Signed)
 Patient placed on cardiac monitor. CCMD called.
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
## 2024-03-01 NOTE — ED Provider Notes (Signed)
 Received patient in turnover from Dr. Synetta Eves.  Please see their note for further details of Hx, PE.  Briefly patient is a 68 y.o. female with a Weakness .  Reported to be much more weak than normal per daughter.  Patient without specific complaints.  Interactive alert here.  Plan for blood work reassess.  Blood work is resulted without any obvious acute change.  No anemia no leukocytosis, no significant electrolyte abnormalities.  Renal function appears to be at baseline.  EtOH negative.  I discussed the results with the patient and family.  It sounds like this problem has been off and on for some time.  Family would like her observed overnight in the hospital.  I discussed with them they do not see any obvious indication for observation, patient with symptoms that have come and gone for some time.  I encouraged them to follow-up with her family doctor in the office.  They tell me that their family doctor is unwilling to see them back in the office.  I will refer them to neurology.    Lindsey Hughs, DO 03/01/24 Lindsey [name]

## 2024-03-01 NOTE — ED Notes (Signed)
 Attempt IV placement x2 without success. EMT attempted blood draw.  PA aware

## 2024-03-01 NOTE — ED Provider Triage Note (Signed)
 Emergency Medicine Provider Triage Evaluation Note  Quiana Cobaugh , a 68 y.o. female  was evaluated in triage.  Pt complains of worsening of dementia/cognitive decline since last week when she had seizure. Also with right sided weakness x2 days. Daughter on phone able to provide better hx.  Review of Systems  Positive:  Negative:   Physical Exam  BP (!) 145/94   Temp 97.6 F (36.4 C) (Oral)   Resp 16   SpO2 98%  Gen:   Awake, no distress   Resp:  Normal effort  MSK:   Moves extremities without difficulty  Other:   Medical Decision Making  Medically screening exam initiated at 1:19 PM.  Appropriate orders placed.  Marisue Canion was informed that the remainder of the evaluation will be completed by another provider, this initial triage assessment does not replace that evaluation, and the importance of remaining in the ED until their evaluation is complete.    Dorisann Garre F, [place] 03/01/24 1320
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
## 2024-03-01 NOTE — ED Notes (Signed)
 Spoke with her daughter on the phone who states that pt has dementia and she has had more confusion, unable to care for herself and some agitation at times since she had her seizure last week.  The generalized weakness (right more than left) has been ongoing since seizure as well).  Daughter states that pt usually declines after seizure.  PA saw pt in EMS triage.

## 2024-03-02 LAB — CBG MONITORING, ED: Glucose-Capillary: 92 mg/dL (ref 70–99)

## 2024-03-12 ENCOUNTER — Emergency Department (HOSPITAL_COMMUNITY)

## 2024-03-12 ENCOUNTER — Encounter (HOSPITAL_COMMUNITY): Payer: Self-pay | Admitting: Emergency Medicine

## 2024-03-12 ENCOUNTER — Inpatient Hospital Stay (HOSPITAL_COMMUNITY)
Admission: EM | Admit: 2024-03-12 | Discharge: 2024-03-15 | DRG: 101 | Disposition: A | Discharge status: Home / Self Care | Attending: Internal Medicine | Admitting: Internal Medicine

## 2024-03-12 ENCOUNTER — Other Ambulatory Visit: Payer: Self-pay

## 2024-03-12 DIAGNOSIS — B9689 Other specified bacterial agents as the cause of diseases classified elsewhere: Secondary | ICD-10-CM | POA: Diagnosis present

## 2024-03-12 DIAGNOSIS — N3000 Acute cystitis without hematuria: Secondary | ICD-10-CM | POA: Diagnosis present

## 2024-03-12 DIAGNOSIS — G40909 Epilepsy, unspecified, not intractable, without status epilepticus: Secondary | ICD-10-CM

## 2024-03-12 DIAGNOSIS — Z9102 Food additives allergy status: Secondary | ICD-10-CM

## 2024-03-12 DIAGNOSIS — Z800 Family history of malignant neoplasm of digestive organs: Secondary | ICD-10-CM

## 2024-03-12 DIAGNOSIS — R55 Syncope and collapse: Principal | ICD-10-CM

## 2024-03-12 DIAGNOSIS — R531 Weakness: Secondary | ICD-10-CM

## 2024-03-12 DIAGNOSIS — Z8249 Family history of ischemic heart disease and other diseases of the circulatory system: Secondary | ICD-10-CM

## 2024-03-12 DIAGNOSIS — G40509 Epileptic seizures related to external causes, not intractable, without status epilepticus: Principal | ICD-10-CM | POA: Diagnosis present

## 2024-03-12 DIAGNOSIS — Z885 Allergy status to narcotic agent status: Secondary | ICD-10-CM

## 2024-03-12 DIAGNOSIS — Z811 Family history of alcohol abuse and dependence: Secondary | ICD-10-CM

## 2024-03-12 DIAGNOSIS — I10 Essential (primary) hypertension: Secondary | ICD-10-CM | POA: Diagnosis present

## 2024-03-12 DIAGNOSIS — Z833 Family history of diabetes mellitus: Secondary | ICD-10-CM

## 2024-03-12 DIAGNOSIS — Z7951 Long term (current) use of inhaled steroids: Secondary | ICD-10-CM

## 2024-03-12 DIAGNOSIS — Z860100 Personal history of colon polyps, unspecified: Secondary | ICD-10-CM

## 2024-03-12 DIAGNOSIS — R569 Unspecified convulsions: Secondary | ICD-10-CM | POA: Diagnosis not present

## 2024-03-12 DIAGNOSIS — Z880 Allergy status to penicillin: Secondary | ICD-10-CM

## 2024-03-12 DIAGNOSIS — Z886 Allergy status to analgesic agent status: Secondary | ICD-10-CM

## 2024-03-12 DIAGNOSIS — Z993 Dependence on wheelchair: Secondary | ICD-10-CM

## 2024-03-12 DIAGNOSIS — E669 Obesity, unspecified: Secondary | ICD-10-CM | POA: Diagnosis present

## 2024-03-12 DIAGNOSIS — Z87891 Personal history of nicotine dependence: Secondary | ICD-10-CM

## 2024-03-12 DIAGNOSIS — Z91041 Radiographic dye allergy status: Secondary | ICD-10-CM

## 2024-03-12 DIAGNOSIS — J4489 Other specified chronic obstructive pulmonary disease: Secondary | ICD-10-CM | POA: Diagnosis present

## 2024-03-12 DIAGNOSIS — Z813 Family history of other psychoactive substance abuse and dependence: Secondary | ICD-10-CM

## 2024-03-12 DIAGNOSIS — R296 Repeated falls: Secondary | ICD-10-CM

## 2024-03-12 DIAGNOSIS — Z79899 Other long term (current) drug therapy: Secondary | ICD-10-CM

## 2024-03-12 DIAGNOSIS — Z820 Family history of epilepsy and other diseases of the nervous system: Secondary | ICD-10-CM

## 2024-03-12 DIAGNOSIS — K219 Gastro-esophageal reflux disease without esophagitis: Secondary | ICD-10-CM

## 2024-03-12 DIAGNOSIS — J449 Chronic obstructive pulmonary disease, unspecified: Secondary | ICD-10-CM

## 2024-03-12 DIAGNOSIS — Z809 Family history of malignant neoplasm, unspecified: Secondary | ICD-10-CM

## 2024-03-12 LAB — COMPREHENSIVE METABOLIC PANEL WITH GFR
ALT: 11 U/L (ref 0–44)
AST: 14 U/L — ABNORMAL LOW (ref 15–41)
Albumin: 3.4 g/dL — ABNORMAL LOW (ref 3.5–5.0)
Alkaline Phosphatase: 99 U/L (ref 38–126)
Anion gap: 8 (ref 5–15)
BUN: 14 mg/dL (ref 8–23)
CO2: 28 mmol/L (ref 22–32)
Calcium: 9.1 mg/dL (ref 8.9–10.3)
Chloride: 102 mmol/L (ref 98–111)
Creatinine, Ser: 0.88 mg/dL (ref 0.44–1.00)
GFR, Estimated: >60 mL/min (ref >60)
Glucose, Bld: 79 mg/dL (ref 70–99)
Potassium: 4.2 mmol/L (ref 3.5–5.1)
Sodium: 138 mmol/L (ref 135–145)
Total Bilirubin: 0.6 mg/dL (ref 0.0–1.2)
Total Protein: 6.9 g/dL (ref 6.5–8.1)

## 2024-03-12 LAB — URINALYSIS, ROUTINE W REFLEX MICROSCOPIC
Bilirubin Urine: NEGATIVE
Glucose, UA: NEGATIVE mg/dL
Ketones, ur: NEGATIVE mg/dL
Nitrite: NEGATIVE
Protein, ur: NEGATIVE mg/dL
Specific Gravity, Urine: 1.002 — ABNORMAL LOW (ref 1.005–1.030)
WBC, UA: >50 WBC/hpf (ref 0–5)
pH: 7 (ref 5.0–8.0)

## 2024-03-12 LAB — CBC WITH DIFFERENTIAL/PLATELET
Abs Immature Granulocytes: 0.03 10*3/uL (ref 0.00–0.07)
Basophils Absolute: 0 10*3/uL (ref 0.0–0.1)
Basophils Relative: 1 %
Eosinophils Absolute: 0.1 10*3/uL (ref 0.0–0.5)
Eosinophils Relative: 1 %
HCT: 36.4 % (ref 36.0–46.0)
Hemoglobin: 11.4 g/dL — ABNORMAL LOW (ref 12.0–15.0)
Immature Granulocytes: 0 %
Lymphocytes Relative: 24 %
Lymphs Abs: 2.1 10*3/uL (ref 0.7–4.0)
MCH: 29.1 pg (ref 26.0–34.0)
MCHC: 31.3 g/dL (ref 30.0–36.0)
MCV: 92.9 fL (ref 80.0–100.0)
Monocytes Absolute: 0.9 10*3/uL (ref 0.1–1.0)
Monocytes Relative: 11 %
Neutro Abs: 5.5 10*3/uL (ref 1.7–7.7)
Neutrophils Relative %: 63 %
Platelets: 390 10*3/uL (ref 150–400)
RBC: 3.92 MIL/uL (ref 3.87–5.11)
RDW: 13.9 % (ref 11.5–15.5)
WBC: 8.6 10*3/uL (ref 4.0–10.5)
nRBC: 0 % (ref 0.0–0.2)

## 2024-03-12 LAB — AMMONIA: Ammonia: 48 umol/L — ABNORMAL HIGH (ref 9–35)

## 2024-03-12 LAB — VALPROIC ACID LEVEL: Valproic Acid Lvl: 21 ug/mL — ABNORMAL LOW (ref 50–100)

## 2024-03-12 MED ORDER — ONDANSETRON HCL 4 MG/2ML IJ SOLN: 4.0000 mg | Freq: Four times a day (QID) | INTRAMUSCULAR | Status: DC | PRN

## 2024-03-12 MED ORDER — DIVALPROEX SODIUM ER 500 MG PO TB24: 1000.0000 mg | ORAL_TABLET | Freq: Two times a day (BID) | ORAL | Status: DC

## 2024-03-12 MED ORDER — SODIUM CHLORIDE 0.9 % IV SOLN
1.0000 g | INTRAVENOUS | Status: DC
  Administered 2024-03-12 – 2024-03-13 (×2): 1 g via INTRAVENOUS
  Filled 2024-03-12 (×2): qty 10

## 2024-03-12 MED ORDER — ENOXAPARIN SODIUM 40 MG/0.4ML IJ SOSY
40.0000 mg | PREFILLED_SYRINGE | INTRAMUSCULAR | Status: DC
  Administered 2024-03-13 – 2024-03-14 (×2): 40 mg via SUBCUTANEOUS
  Filled 2024-03-12 (×3): qty 0.4

## 2024-03-12 MED ORDER — ONDANSETRON HCL 4 MG PO TABS: 4.0000 mg | ORAL_TABLET | Freq: Four times a day (QID) | ORAL | Status: DC | PRN

## 2024-03-12 MED ORDER — ACETAMINOPHEN 325 MG PO TABS
650.0000 mg | ORAL_TABLET | Freq: Four times a day (QID) | ORAL | Status: DC | PRN
  Administered 2024-03-13 – 2024-03-14 (×3): 650 mg via ORAL
  Filled 2024-03-12 (×5): qty 2

## 2024-03-12 MED ORDER — LEVETIRACETAM 500 MG PO TABS
1500.0000 mg | ORAL_TABLET | Freq: Two times a day (BID) | ORAL | Status: DC
  Administered 2024-03-13 – 2024-03-15 (×5): 1500 mg via ORAL
  Filled 2024-03-12 (×6): qty 3

## 2024-03-12 MED ORDER — PANTOPRAZOLE SODIUM 40 MG PO TBEC
40.0000 mg | DELAYED_RELEASE_TABLET | Freq: Every day | ORAL | Status: DC
  Administered 2024-03-12 – 2024-03-15 (×4): 40 mg via ORAL
  Filled 2024-03-12 (×4): qty 1

## 2024-03-12 MED ORDER — DIVALPROEX SODIUM ER 500 MG PO TB24: 500.0000 mg | ORAL_TABLET | Freq: Two times a day (BID) | ORAL | Status: DC

## 2024-03-12 MED ORDER — LEVETIRACETAM 500 MG PO TABS: 750.0000 mg | ORAL_TABLET | Freq: Two times a day (BID) | ORAL | Status: DC

## 2024-03-12 MED ORDER — LEVETIRACETAM 500 MG PO TABS: 1000.0000 mg | ORAL_TABLET | Freq: Two times a day (BID) | ORAL | Status: DC

## 2024-03-12 MED ORDER — LEVETIRACETAM (KEPPRA) 500 MG/5 ML ADULT IV PUSH
1500.0000 mg | Freq: Once | INTRAVENOUS | Status: AC
  Filled 2024-03-12: qty 15

## 2024-03-12 MED ORDER — DIVALPROEX SODIUM ER 500 MG PO TB24
750.0000 mg | ORAL_TABLET | Freq: Two times a day (BID) | ORAL | Status: DC
  Administered 2024-03-12 – 2024-03-15 (×6): 750 mg via ORAL
  Filled 2024-03-12 (×7): qty 1

## 2024-03-12 MED ORDER — ALBUTEROL SULFATE (2.5 MG/3ML) 0.083% IN NEBU: 3.0000 mL | INHALATION_SOLUTION | Freq: Two times a day (BID) | RESPIRATORY_TRACT | Status: DC | PRN

## 2024-03-12 NOTE — ED Provider Notes (Signed)
 68 yo female presenting with gait difficulty, had seizure last night, syncope this morning with weakness  CT head unremarkable, patient alert on arrival UA possible infection (?)   Known hx of seizures, on Keppra , pt reports compliance with medications  Her daughter reports patient takes Depakote  750 mg BID and Keppra  1000 mg BID - they are reliable in taking them daily because daughter gives them.  These were recently increased 6 days ago because patient was still having seizures. Patient is not drinking etoh in past 5-6 years.  Physical Exam  BP (!) 158/99   Pulse (!) 112   Temp 97.6 F (36.4 C) (Oral)   Resp (!) 29   SpO2 100%   Physical Exam  Procedures  Procedures  ED Course / MDM   Clinical Course as of 03/12/24 1644  Mon Mar 12, 2024  1237 No acute findings noted on humerus tib-fib head and C-spine CT [JK]  1314 CBC WITH DIFFERENTIAL(!) [JK]  1315 CBC WITH DIFFERENTIAL(!) Hgb decreased from previous values, [JK]  1435 Metabolic panel normal.  Urinalysis with possible UTI [JK]  1548 Case discussed with Dr Mikle Alexanders [JK]  1635 Dr Murvin Arthurs neurology recommending increasing Keppra  to 1500 mg BID and maintain depakote  at 750 mg BID, check levels of both medication, check ammonia level. [MT]    Clinical Course User Index [JK] Trish Furl, MD [MT] Gordon Latus Janalyn Me, MD   Medical Decision Making Amount and/or Complexity of Data Reviewed Labs: ordered. Decision-making details documented in ED Course. Radiology: ordered.  Risk Decision regarding hospitalization.          Arvilla Birmingham, MD 03/12/24 [phone]

## 2024-03-12 NOTE — H&P (Addendum)
 History and Physical    Lindsey [name] VHQ:[id number] DOB: 02-Jul-1956 DOA: 03/12/2024  I have briefly reviewed the patient's prior medical records in [facility]  PCP: Pcp, No  Patient coming from: Long stay hotel  Chief Complaint: Weakness/falls/seizures  HPI: Lindsey [name] is a 68 y.o. female with medical history significant of drug abuse/alcohol abuse, seizures, hypertension, GERD.  Patient in the last month has been seen several times in the ER.  First time was 5/25 for a fall.  She reported that time she was wheelchair-bound and complaining of lower extremity pain.  Patient states she was trying to get out of bed and fell.  Workup done and patient was sent out of the ER.  Patient return to the ER on 6/5 due to increased weakness.  All laboratory studies were normal at that time.  Family requested that she be admitted but there was no reason for that.  Patient is brought back to the ER on 6/16 with complaints of seizure and fall.  Daughter is not available currently and patient is not a very good historian and unable to provide information.  Per chart review shows the patient stays in extended stay hotel and daughter reports that she had a seizure last night with a fall and currently patient is at her neurologic baseline.  Patient was seen by the ER doctor with multiple x-rays and CT scans done without any findings.  Laboratory data was done as well showing a possible urinary tract infection.  Patient denies any current symptoms of UTI.   Asked ER doctor to call neurology to see if patient needs to be at [facility] with her continued seizures if any adjustments need to be made to her medications  Review of Systems: Unable to be done as patient is not able to participate  Past Medical History:  Diagnosis Date   Arthritis    Asthma    Drug abuse (HCC)    recovered   GERD (gastroesophageal reflux disease)    H/O ETOH abuse    HTN (hypertension)    Seizure (HCC)     Past Surgical  History:  Procedure Laterality Date   ABDOMINAL HYSTERECTOMY     BIOPSY  09/14/2022   Procedure: BIOPSY;  Surgeon: Asencion Blacksmith, MD;  Location: [facility];  Service: Gastroenterology;;   COLONOSCOPY WITH PROPOFOL  N/A 03/18/2022   Procedure: COLONOSCOPY WITH PROPOFOL ;  Surgeon: Janel Medford, MD;  Location: [facility];  Service: Gastroenterology;  Laterality: N/A;   ESOPHAGOGASTRODUODENOSCOPY (EGD) WITH PROPOFOL  N/A 09/14/2022   Procedure: ESOPHAGOGASTRODUODENOSCOPY (EGD) WITH PROPOFOL ;  Surgeon: Asencion Blacksmith, MD;  Location: [facility];  Service: Gastroenterology;  Laterality: N/A;   GALLBLADDER SURGERY     peptic ulcer surgery     POLYPECTOMY  03/18/2022   Procedure: POLYPECTOMY;  Surgeon: Janel Medford, MD;  Location: [facility];  Service: Gastroenterology;;   Dixie Frederickson DILATION N/A 09/14/2022   Procedure: Dixie Frederickson DILATION;  Surgeon: Asencion Blacksmith, MD;  Location: [facility];  Service: Gastroenterology;  Laterality: N/A;     reports that she has quit smoking. Her smoking use included cigarettes. She has never used smokeless tobacco. She reports that she does not currently use drugs after having used the following drugs: Marijuana. She reports that she does not drink alcohol.  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    Ibuprofen  Swelling   Kiwi Extract Hives and Itching   Morphine  And Codeine  Hives   Penicillins Hives and Other (See Comments)    Has patient had a PCN reaction causing immediate rash, facial/tongue/throat swelling, SOB or lightheadedness with hypotension: Yes Has patient had a PCN reaction causing severe rash involving mucus membranes or skin necrosis: No Has patient had a PCN reaction that required hospitalization No Has patient had a  PCN reaction occurring within the last 10 years: No If all of the above answers are NO, then may proceed with Cephalosporin use.   Strawberry Extract Hives and Itching    Family History  Problem Relation Age of Onset   Seizures Mother    Alcohol abuse Mother    Heart attack Father    Cancer Daughter        type unknown   Diabetes Daughter    Hypertension Daughter    Drug abuse Brother    Heart attack Brother    Pancreatic cancer Brother    BRCA 1/2 Neg Hx    Breast cancer Neg Hx     Prior to Admission medications   Medication Sig Start Date End Date Taking? Authorizing Provider  albuterol  (VENTOLIN  HFA) 108 (90 Base) MCG/ACT inhaler Inhale 2 puffs into the lungs 2 (two) times daily as needed for wheezing or shortness of breath.    [provider]  divalproex  (DEPAKOTE  ER) 500 MG 24 hr tablet Take 500 mg by mouth in the morning and at bedtime.    [provider]  hydrochlorothiazide  (HYDRODIURIL ) 12.5 MG tablet Take 12.5 mg by mouth daily. Patient not taking: Reported on 08/17/2023    [provider]  levETIRAcetam  (KEPPRA ) 750 MG tablet Take 1 tablet (750 mg total) by mouth 2 (two) times daily. 07/20/19   Bart Born, MD  losartan  (COZAAR ) 50 MG tablet Take 50 mg by mouth daily. Patient not taking: Reported on 08/17/2023    [provider]  pantoprazole  (PROTONIX ) 40 MG tablet Take 1 tablet (40 mg total) by mouth 2 (two) times daily. 06/07/23   Asencion Blacksmith, MD  pantoprazole  (PROTONIX ) 40 MG tablet Take 1 tablet (40 mg total) by mouth daily. 08/17/23   Tobin Forts, MD  rosuvastatin  (CRESTOR ) 10 MG tablet Take 10 mg by mouth daily. Patient not taking: Reported on 08/17/2023    [provider]  senna-docusate (SENOKOT-S) 8.6-50 MG tablet Take 1 tablet by mouth daily as needed for mild constipation. Patient not taking: Reported on 09/23/2023    [provider]  TYLENOL  8 HOUR ARTHRITIS PAIN 650 MG CR tablet Take  650-1,300 mg by mouth 2 (two) times daily. Patient not taking: Reported on 08/17/2023    [provider]    Physical Exam: Vitals:   03/12/24 1200 03/12/24 1230 03/12/24 1330 03/12/24 1530  BP: (!) 142/87 (!) 151/80 (!) 167/104 (!) 158/99  Pulse: 92 93 92 (!) 112  Resp:   (!) 22 (!) 29  Temp:    97.6 F (36.4 C)  TempSrc:    Oral  SpO2: 95% 95% 97% 100%      Constitutional: NAD, calm, comfortable Eyes: PERRL, lids and conjunctivae normal Neck: normal, supple, no masses, no thyromegaly Respiratory: clear to auscultation bilaterally, no wheezing, no crackles. Normal respiratory effort. No accessory muscle use.  Cardiovascular: Regular rate and rhythm, no murmurs / rubs / gallops. No extremity edema. 2+ pedal  pulses.  Abdomen: no tenderness, no masses palpated. Bowel sounds positive.  Musculoskeletal: no clubbing / cyanosis. Normal muscle tone.  Skin:  alopecia and discoloration of skin on head Neurologic: Oriented to person but not situation Psychiatric:  Alert to self but unable to give history-cooperative  Labs on Admission: I have personally reviewed following labs and imaging studies  CBC: Recent Labs  Lab 03/12/24 1253  WBC 8.6  NEUTROABS 5.5  HGB 11.4*  HCT 36.4  MCV 92.9  PLT 390   Basic Metabolic Panel: Recent Labs  Lab 03/12/24 1253  NA 138  K 4.2  CL 102  CO2 28  GLUCOSE 79  BUN 14  CREATININE 0.88  CALCIUM  9.1   GFR: CrCl cannot be calculated (Unknown ideal weight.). Liver Function Tests: Recent Labs  Lab 03/12/24 1253  AST 14*  ALT 11  ALKPHOS 99  BILITOT 0.6  PROT 6.9  ALBUMIN 3.4*   No results for input(s): LIPASE, AMYLASE in the last 168 hours. No results for input(s): AMMONIA in the last 168 hours. Coagulation Profile: No results for input(s): INR, PROTIME in the last 168 hours. Cardiac Enzymes: No results for input(s): CKTOTAL, CKMB, CKMBINDEX, TROPONINI in the last 168 hours. BNP (last 3 results) No  results for input(s): PROBNP in the last 8760 hours. HbA1C: No results for input(s): HGBA1C in the last 72 hours. CBG: No results for input(s): GLUCAP in the last 168 hours. Lipid Profile: No results for input(s): CHOL, HDL, LDLCALC, TRIG, CHOLHDL, LDLDIRECT in the last 72 hours. Thyroid Function Tests: No results for input(s): TSH, T4TOTAL, FREET4, T3FREE, THYROIDAB in the last 72 hours. Anemia Panel: No results for input(s): VITAMINB12, FOLATE, FERRITIN, TIBC, IRON, RETICCTPCT in the last 72 hours. Urine analysis:    Component Value Date/Time   COLORURINE STRAW (A) 03/12/2024 1344   APPEARANCEUR HAZY (A) 03/12/2024 1344   APPEARANCEUR Clear 01/28/2014 1937   LABSPEC 1.002 (L) 03/12/2024 1344   LABSPEC 1.015 01/28/2014 1937   PHURINE 7.0 03/12/2024 1344   GLUCOSEU NEGATIVE 03/12/2024 1344   GLUCOSEU Negative 01/28/2014 1937   HGBUR SMALL (A) 03/12/2024 1344   BILIRUBINUR NEGATIVE 03/12/2024 1344   BILIRUBINUR Negative 01/28/2014 1937   KETONESUR NEGATIVE 03/12/2024 1344   PROTEINUR NEGATIVE 03/12/2024 1344   UROBILINOGEN 0.2 03/01/2015 1935   NITRITE NEGATIVE 03/12/2024 1344   LEUKOCYTESUR LARGE (A) 03/12/2024 1344   LEUKOCYTESUR Negative 01/28/2014 1937     Radiological Exams on Admission: DG Shoulder Right Result Date: 03/12/2024 CLINICAL DATA:  Pain after fall EXAM: RIGHT SHOULDER - 4 VIEW COMPARISON:  None Available. FINDINGS: Osteopenia. Preserved joint spaces. Small osteophytes seen of the [place] joint and glenohumeral joint. Overlapping cardiac leads and gown snaps. IMPRESSION: Osteopenia.  Mild degenerative changes. Electronically Signed   By: Adrianna Horde M.D.   On: 03/12/2024 14:05   DG Tibia/Fibula Right Result Date: 03/12/2024 CLINICAL DATA:  Lower leg pain after falling. EXAM: RIGHT TIBIA AND FIBULA - 2 VIEW COMPARISON:  Radiographs 02/19/2024. FINDINGS: Stable postsurgical changes from right tibial intramedullary nail fixation.  The more distal of the 2 proximal interlocking screws is chronically fracture. There is no hardware loosening. Stable posttraumatic deformities of the mid tibial and distal fibular shafts from old healed fractures. No evidence of acute fracture, dislocation or unexpected foreign body. IMPRESSION: No acute osseous findings. Stable postsurgical and posttraumatic changes as described. Electronically Signed   By: Elmon Hagedorn M.D.   On: 03/12/2024 12:34   DG Humerus Right Result Date: 03/12/2024 CLINICAL DATA:  Upper arm pain after falling.  Seizure last night. EXAM: RIGHT HUMERUS - 2+ VIEW COMPARISON:  Right shoulder radiographs 02/06/2015. FINDINGS: There is little difference in the projection of the proximal upper arm on the submitted two views. The mineralization and alignment are normal. There is no evidence of acute fracture or dislocation. Mild glenohumeral and acromioclavicular degenerative changes. IMPRESSION: No evidence of acute fracture or dislocation. Dedicated shoulder radiographs should be considered given the limitations of this study. Electronically Signed   By: Elmon Hagedorn M.D.   On: 03/12/2024 12:31   CT HEAD WO CONTRAST Result Date: 03/12/2024 CLINICAL DATA:  Head trauma, seizure and syncope last night. Fall with right arm pain. EXAM: CT HEAD WITHOUT CONTRAST CT CERVICAL SPINE WITHOUT CONTRAST TECHNIQUE: Multidetector CT imaging of the head and cervical spine was performed following the standard protocol without intravenous contrast. Multiplanar CT image reconstructions of the cervical spine were also generated. RADIATION DOSE REDUCTION: This exam was performed according to the departmental dose-optimization program which includes automated exposure control, adjustment of the mA and/or kV according to patient size and/or use of iterative reconstruction technique. COMPARISON:  CT head 03/01/2024, CT cervical spine 02/19/2024. FINDINGS: CT HEAD FINDINGS Brain: No acute intracranial  hemorrhage. No CT evidence of acute infarct. Nonspecific hypoattenuation in the periventricular and subcortical white matter favored to reflect chronic microvascular ischemic changes. Similar parenchymal volume loss. No edema, mass effect, or midline shift. The basilar cisterns are patent. Ventricles: The ventricles are normal. Vascular: No hyperdense vessel or unexpected calcification. Skull: No acute or aggressive finding. Orbits: Orbits are symmetric. Sinuses: Secretions in the left sphenoid sinus similar to prior. Other: Mastoid air cells are clear. Mild soft tissue swelling in the right forehead. CT CERVICAL SPINE FINDINGS Alignment: Cervical lordosis is maintained. Similar trace retrolisthesis of C4 on C5. No facet subluxation or dislocation. Skull base and vertebrae: No compression fracture or displaced fracture in the cervical spine. Similar chronic ossified fragments extending into the spinal canal at the level of C5. No suspicious osseous lesion. Soft tissues and spinal canal: No prevertebral fluid or swelling. No visible canal hematoma. Disc levels: Mild intervertebral disc space narrowing. Osseous fragments at C5 along the right dorsal aspect of the spinal canal with similar degree of spinal canal stenosis. Facet arthrosis at multiple levels. Foraminal narrowing is most pronounced on the left at C4-5. Upper chest: Negative. Other: None. IMPRESSION: No CT evidence of acute intracranial abnormality. No acute fracture or traumatic malalignment of the cervical spine. Mild soft tissue swelling in the right forehead. 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 Chronic microvascular ischemic changes and mild parenchymal volume loss. Electronically Signed   By: Denny Flack M.D.   On: 03/12/2024 12:22   CT CERVICAL SPINE WO CONTRAST Result Date: 03/12/2024 CLINICAL DATA:  Head trauma, seizure and syncope last night. Fall with right arm pain. EXAM: CT  HEAD WITHOUT CONTRAST CT CERVICAL SPINE WITHOUT CONTRAST TECHNIQUE: Multidetector CT imaging of the head and cervical spine was performed following the standard protocol without intravenous contrast. Multiplanar CT image reconstructions of the cervical spine were also generated. RADIATION DOSE REDUCTION: This exam was performed according to the departmental dose-optimization program which includes automated exposure control, adjustment of the mA and/or kV according to patient size and/or use of iterative reconstruction technique. COMPARISON:  CT head 03/01/2024, CT cervical spine 02/19/2024. FINDINGS: CT HEAD FINDINGS Brain: No acute intracranial hemorrhage. No CT evidence of acute infarct. Nonspecific hypoattenuation in  the periventricular and subcortical white matter favored to reflect chronic microvascular ischemic changes. Similar parenchymal volume loss. No edema, mass effect, or midline shift. The basilar cisterns are patent. Ventricles: The ventricles are normal. Vascular: No hyperdense vessel or unexpected calcification. Skull: No acute or aggressive finding. Orbits: Orbits are symmetric. Sinuses: Secretions in the left sphenoid sinus similar to prior. Other: Mastoid air cells are clear. Mild soft tissue swelling in the right forehead. CT CERVICAL SPINE FINDINGS Alignment: Cervical lordosis is maintained. Similar trace retrolisthesis of C4 on C5. No facet subluxation or dislocation. Skull base and vertebrae: No compression fracture or displaced fracture in the cervical spine. Similar chronic ossified fragments extending into the spinal canal at the level of C5. No suspicious osseous lesion. Soft tissues and spinal canal: No prevertebral fluid or swelling. No visible canal hematoma. Disc levels: Mild intervertebral disc space narrowing. Osseous fragments at C5 along the right dorsal aspect of the spinal canal with similar degree of spinal canal stenosis. Facet arthrosis at multiple levels. Foraminal narrowing  is most pronounced on the left at C4-5. Upper chest: Negative. Other: None. IMPRESSION: No CT evidence of acute intracranial abnormality. No acute fracture or traumatic malalignment of the cervical spine. Mild soft tissue swelling in the right forehead. 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 Chronic microvascular ischemic changes and mild parenchymal volume loss. Electronically Signed   By: Denny Flack M.D.   On: 03/12/2024 12:22      Assessment/Plan     Seizures - Known history On Keppra  and Depakote -patient unable to say if she is taking them daily as her daughter does her medications and daughter was not available - Seizure precautions - Treat possible UTI --Valproic  acid level pending -neurology recommendations:  checking keppra  and depakote  and ammonia levels.  increase Keppra  from 1000 mg BID to 1500 mg BID, and keep her depakote  on its current dose at 750 mg BID. He did not think she needed to be transferred to [facility] unless she has ongoing seizures.  - Verified with pharmacy that most recent doses are the following:  Keppra  1000mg  po BID and Depakote  500mg  BID.    Possible  UTI - Culture pending - Rocephin  started  Falls - Appears to be related to her seizures - Multiple x-rays and CT scans were done with no apparent injury other than soft tissue swelling - PT OT eval   Enrig[facility]   How to contact the [facility] Attending or Consulting provider 7A - 7P or covering provider during after hours 7P -7A, for this patient?  Check the care team in [facility] and look for a) attending/consulting [facility] provider listed and b) the [facility] team listed Log into [email or web address] and use [facility]'s universal password to access. If you do not have the password, please contact the hospital operator. Locate the [facility] provider you are looking for under [facility] and page to a number that you can be directly reached. If  you still have difficulty reaching the provider, please page the [facility] (Director on Call) for the Hospitalists listed on amion for assistance.  03/12/2024, 4:06 PM

## 2024-03-12 NOTE — ED Notes (Addendum)
 Due to possible bed bugs pt is showering before entering room   Vitals will be obtained when pt is done showering.

## 2024-03-12 NOTE — ED Triage Notes (Signed)
 Per EMS, Pt, from [facility], c/o seizure last night and syncope w/ fall and R arm pain today.  Pt's daughter reported to EMS that the Pt is at her neuro baseline.    Pt is noted to be covered in bed bugs.

## 2024-03-12 NOTE — ED Notes (Signed)
 Pt doesn't have bug beds

## 2024-03-12 NOTE — ED Notes (Signed)
 Pt stood with walker but was unable to take any steps. Complained of pain and weakness in right leg.

## 2024-03-12 NOTE — ED Provider Notes (Signed)
 [facility] AT [facility] Provider Note   CSN: [id number] Arrival date & time: 03/12/24  1017     Patient presents with: Seizures, Arm Injury, and Fall   Lindsey [name] is a 68 y.o. female.  {Add pertinent medical, surgical, social history, OB history to HPI:32947}  Seizures Arm Injury Fall     Patient has a history of asthma alcohol abuse seizure disorder acid reflux arthritis hypertension.  Patient has been having some issues with gait instability and falls.  Patient was seen in the emergency room last month after a fall.  Patient has been referred to neurology.  Patient had a seizure last evening.  Patient states she has been compliant with her medications.  Daughter states this morning she also had a fall and a syncopal episode.  .  She is not have any chest pain or shortness of breath.  No headache.  Patient states she is having soreness in her right arm in her lower leg after the fall.  Prior to Admission medications   Medication Sig Start Date End Date Taking? Authorizing Provider  albuterol  (VENTOLIN  HFA) 108 (90 Base) MCG/ACT inhaler Inhale 2 puffs into the lungs 2 (two) times daily as needed for wheezing or shortness of breath.    [provider]  divalproex  (DEPAKOTE  ER) 500 MG 24 hr tablet Take 500 mg by mouth in the morning and at bedtime.    [provider]  hydrochlorothiazide  (HYDRODIURIL ) 12.5 MG tablet Take 12.5 mg by mouth daily. Patient not taking: Reported on 08/17/2023    [provider]  levETIRAcetam  (KEPPRA ) 750 MG tablet Take 1 tablet (750 mg total) by mouth 2 (two) times daily. 07/20/19   Bart Born, MD  losartan  (COZAAR ) 50 MG tablet Take 50 mg by mouth daily. Patient not taking: Reported on 08/17/2023    [provider]  pantoprazole  (PROTONIX ) 40 MG tablet Take 1 tablet (40 mg total) by mouth 2 (two) times daily. 06/07/23   Asencion Blacksmith, MD  pantoprazole  (PROTONIX ) 40 MG tablet  Take 1 tablet (40 mg total) by mouth daily. 08/17/23   Tobin Forts, MD  rosuvastatin  (CRESTOR ) 10 MG tablet Take 10 mg by mouth daily. Patient not taking: Reported on 08/17/2023    [provider]  senna-docusate (SENOKOT-S) 8.6-50 MG tablet Take 1 tablet by mouth daily as needed for mild constipation. Patient not taking: Reported on 09/23/2023    [provider]  TYLENOL  8 HOUR ARTHRITIS PAIN 650 MG CR tablet Take 650-1,300 mg by mouth 2 (two) times daily. Patient not taking: Reported on 08/17/2023    [provider]    Allergies: Iodinated contrast media, Oxycodone , Ibuprofen , Kiwi extract, Morphine  and codeine , Penicillins, and Strawberry extract    Review of Systems  Neurological:  Positive for seizures.    Updated Vital Signs BP (!) 175/96 (BP Location: Right Arm)   Pulse 91   Temp (!) 97.4 F (36.3 C) (Oral)   Resp 19   SpO2 (!) 89%   Physical Exam Vitals and nursing note reviewed.  Constitutional:      General: She is not in acute distress.    Appearance: She is well-developed.  HENT:     Head: Normocephalic and atraumatic.     Comments: Alopecia, dandruff    Right Ear: External ear normal.     Left Ear: External ear normal.   Eyes:     General: No scleral icterus.       Right  eye: No discharge.        Left eye: No discharge.     Conjunctiva/sclera: Conjunctivae normal.   Neck:     Trachea: No tracheal deviation.   Cardiovascular:     Rate and Rhythm: Normal rate and regular rhythm.  Pulmonary:     Effort: Pulmonary effort is normal. No respiratory distress.     Breath sounds: Normal breath sounds. No stridor. No wheezing or rales.  Abdominal:     General: Bowel sounds are normal. There is no distension.     Palpations: Abdomen is soft.     Tenderness: There is no abdominal tenderness. There is no guarding or rebound.   Musculoskeletal:        General: Tenderness present. No deformity.     Cervical back: Neck supple.      Comments: Mild tenderness palpation proximal right humerus, right lower extremity below the knee no spinal tenderness   Skin:    General: Skin is warm and dry.     Findings: No rash.   Neurological:     General: No focal deficit present.     Mental Status: She is alert.     Cranial Nerves: No cranial nerve deficit, dysarthria or facial asymmetry.     Sensory: No sensory deficit.     Motor: No abnormal muscle tone or seizure activity.     Coordination: Coordination normal.   Psychiatric:        Mood and Affect: Mood normal.     (all labs ordered are listed, but only abnormal results are displayed) Labs Reviewed - No data to display  EKG: None  Radiology: No results found.  {Document cardiac monitor, telemetry assessment procedure when appropriate:32947} Procedures   Medications Ordered in the ED - No data to display  Clinical Course as of 03/12/24 1556  Mon Mar 12, 2024  1237 No acute findings noted on humerus tib-fib head and C-spine CT [JK]  1314 CBC WITH DIFFERENTIAL(!) [JK]  1315 CBC WITH DIFFERENTIAL(!) Hgb decreased from previous values, [JK]  1435 Metabolic panel normal.  Urinalysis with possible UTI [JK]  1548 Case discussed with Dr Mikle Alexanders [JK]    Clinical Course User Index [JK] Trish Furl, MD   {Click here for ABCD2, HEART and other calculators REFRESH Note before signing:1}                              Medical Decision Making Problems Addressed: Seizure [facility]): acute illness or injury that poses a threat to life or bodily functions Syncope, unspecified syncope type: acute illness or injury that poses a threat to life or bodily functions Weakness: acute illness or injury that poses a threat to life or bodily functions  Amount and/or Complexity of Data Reviewed Labs: ordered. Decision-making details documented in ED Course. Radiology: ordered.   ***  {Document critical care time when appropriate  Document review of labs and clinical decision tools ie  CHADS2VASC2, etc  Document your independent review of radiology images and any outside records  Document your discussion with family members, caretakers and with consultants  Document social determinants of health affecting pt's care  Document your decision making why or why not admission, treatments were needed:32947:::1}   Final diagnoses:  None    ED Discharge Orders     None

## 2024-03-13 DIAGNOSIS — Z860100 Personal history of colon polyps, unspecified: Secondary | ICD-10-CM | POA: Diagnosis not present

## 2024-03-13 DIAGNOSIS — Z886 Allergy status to analgesic agent status: Secondary | ICD-10-CM | POA: Diagnosis not present

## 2024-03-13 DIAGNOSIS — N3000 Acute cystitis without hematuria: Secondary | ICD-10-CM | POA: Diagnosis present

## 2024-03-13 DIAGNOSIS — Z813 Family history of other psychoactive substance abuse and dependence: Secondary | ICD-10-CM | POA: Diagnosis not present

## 2024-03-13 DIAGNOSIS — Z820 Family history of epilepsy and other diseases of the nervous system: Secondary | ICD-10-CM | POA: Diagnosis not present

## 2024-03-13 DIAGNOSIS — R569 Unspecified convulsions: Secondary | ICD-10-CM | POA: Diagnosis not present

## 2024-03-13 DIAGNOSIS — J449 Chronic obstructive pulmonary disease, unspecified: Secondary | ICD-10-CM | POA: Diagnosis not present

## 2024-03-13 DIAGNOSIS — Z9102 Food additives allergy status: Secondary | ICD-10-CM | POA: Diagnosis not present

## 2024-03-13 DIAGNOSIS — Z993 Dependence on wheelchair: Secondary | ICD-10-CM | POA: Diagnosis not present

## 2024-03-13 DIAGNOSIS — E669 Obesity, unspecified: Secondary | ICD-10-CM | POA: Diagnosis present

## 2024-03-13 DIAGNOSIS — K219 Gastro-esophageal reflux disease without esophagitis: Secondary | ICD-10-CM | POA: Diagnosis present

## 2024-03-13 DIAGNOSIS — R531 Weakness: Secondary | ICD-10-CM | POA: Diagnosis not present

## 2024-03-13 DIAGNOSIS — R296 Repeated falls: Secondary | ICD-10-CM | POA: Diagnosis not present

## 2024-03-13 DIAGNOSIS — Z811 Family history of alcohol abuse and dependence: Secondary | ICD-10-CM | POA: Diagnosis not present

## 2024-03-13 DIAGNOSIS — Z8249 Family history of ischemic heart disease and other diseases of the circulatory system: Secondary | ICD-10-CM | POA: Diagnosis not present

## 2024-03-13 DIAGNOSIS — Z91041 Radiographic dye allergy status: Secondary | ICD-10-CM | POA: Diagnosis not present

## 2024-03-13 DIAGNOSIS — Z7951 Long term (current) use of inhaled steroids: Secondary | ICD-10-CM | POA: Diagnosis not present

## 2024-03-13 DIAGNOSIS — Z809 Family history of malignant neoplasm, unspecified: Secondary | ICD-10-CM | POA: Diagnosis not present

## 2024-03-13 DIAGNOSIS — B9689 Other specified bacterial agents as the cause of diseases classified elsewhere: Secondary | ICD-10-CM | POA: Diagnosis present

## 2024-03-13 DIAGNOSIS — Z885 Allergy status to narcotic agent status: Secondary | ICD-10-CM | POA: Diagnosis not present

## 2024-03-13 DIAGNOSIS — Z87891 Personal history of nicotine dependence: Secondary | ICD-10-CM | POA: Diagnosis not present

## 2024-03-13 DIAGNOSIS — Z880 Allergy status to penicillin: Secondary | ICD-10-CM | POA: Diagnosis not present

## 2024-03-13 DIAGNOSIS — Z79899 Other long term (current) drug therapy: Secondary | ICD-10-CM | POA: Diagnosis not present

## 2024-03-13 DIAGNOSIS — Z800 Family history of malignant neoplasm of digestive organs: Secondary | ICD-10-CM | POA: Diagnosis not present

## 2024-03-13 DIAGNOSIS — I10 Essential (primary) hypertension: Secondary | ICD-10-CM | POA: Diagnosis present

## 2024-03-13 DIAGNOSIS — G40509 Epileptic seizures related to external causes, not intractable, without status epilepticus: Secondary | ICD-10-CM | POA: Diagnosis present

## 2024-03-13 DIAGNOSIS — J4489 Other specified chronic obstructive pulmonary disease: Secondary | ICD-10-CM | POA: Diagnosis present

## 2024-03-13 DIAGNOSIS — Z833 Family history of diabetes mellitus: Secondary | ICD-10-CM | POA: Diagnosis not present

## 2024-03-13 LAB — COMPREHENSIVE METABOLIC PANEL WITH GFR
ALT: 9 U/L (ref 0–44)
AST: 14 U/L — ABNORMAL LOW (ref 15–41)
Albumin: 2.8 g/dL — ABNORMAL LOW (ref 3.5–5.0)
Alkaline Phosphatase: 103 U/L (ref 38–126)
Anion gap: 10 (ref 5–15)
BUN: 11 mg/dL (ref 8–23)
CO2: 26 mmol/L (ref 22–32)
Calcium: 8.6 mg/dL — ABNORMAL LOW (ref 8.9–10.3)
Chloride: 107 mmol/L (ref 98–111)
Creatinine, Ser: 0.87 mg/dL (ref 0.44–1.00)
GFR, Estimated: >60 mL/min (ref >60)
Glucose, Bld: 97 mg/dL (ref 70–99)
Potassium: 4 mmol/L (ref 3.5–5.1)
Sodium: 143 mmol/L (ref 135–145)
Total Bilirubin: 0.3 mg/dL (ref 0.0–1.2)
Total Protein: 5.9 g/dL — ABNORMAL LOW (ref 6.5–8.1)

## 2024-03-13 LAB — CBC
HCT: 33.8 % — ABNORMAL LOW (ref 36.0–46.0)
Hemoglobin: 10.6 g/dL — ABNORMAL LOW (ref 12.0–15.0)
MCH: 29 pg (ref 26.0–34.0)
MCHC: 31.4 g/dL (ref 30.0–36.0)
MCV: 92.6 fL (ref 80.0–100.0)
Platelets: 380 10*3/uL (ref 150–400)
RBC: 3.65 MIL/uL — ABNORMAL LOW (ref 3.87–5.11)
RDW: 14.2 % (ref 11.5–15.5)
WBC: 7.8 10*3/uL (ref 4.0–10.5)
nRBC: 0 % (ref 0.0–0.2)

## 2024-03-13 LAB — HIV ANTIBODY (ROUTINE TESTING W REFLEX): HIV Screen 4th Generation wRfx: NONREACTIVE

## 2024-03-13 MED ORDER — FLUTICASONE FUROATE-VILANTEROL 100-25 MCG/ACT IN AEPB
1.0000 | INHALATION_SPRAY | Freq: Every day | RESPIRATORY_TRACT | Status: DC
  Administered 2024-03-14 – 2024-03-15 (×2): 1 via RESPIRATORY_TRACT
  Filled 2024-03-13 (×2): qty 28

## 2024-03-13 MED ORDER — LORATADINE 10 MG PO TABS
10.0000 mg | ORAL_TABLET | Freq: Every day | ORAL | Status: DC
  Administered 2024-03-13 – 2024-03-14 (×2): 10 mg via ORAL
  Filled 2024-03-13 (×2): qty 1

## 2024-03-13 MED ORDER — DICLOFENAC SODIUM 1 % EX GEL
2.0000 g | Freq: Four times a day (QID) | CUTANEOUS | Status: DC
  Administered 2024-03-13 – 2024-03-15 (×8): 2 g via TOPICAL
  Filled 2024-03-13: qty 100

## 2024-03-13 MED ORDER — HYDROCODONE-ACETAMINOPHEN 5-325 MG PO TABS
1.0000 | ORAL_TABLET | Freq: Four times a day (QID) | ORAL | Status: DC | PRN
  Administered 2024-03-13 – 2024-03-15 (×5): 1 via ORAL
  Filled 2024-03-13 (×5): qty 1

## 2024-03-13 MED ORDER — FLUTICASONE FUROATE-VILANTEROL 100-25 MCG/ACT IN AEPB
1.0000 | INHALATION_SPRAY | Freq: Every day | RESPIRATORY_TRACT | Status: DC
  Filled 2024-03-13: qty 28

## 2024-03-13 MED ORDER — CALCIUM CARBONATE ANTACID 500 MG PO CHEW
1.0000 | CHEWABLE_TABLET | Freq: Three times a day (TID) | ORAL | Status: DC
  Administered 2024-03-13 – 2024-03-15 (×6): 200 mg via ORAL
  Filled 2024-03-13 (×6): qty 1

## 2024-03-13 MED ORDER — GABAPENTIN 100 MG PO CAPS
100.0000 mg | ORAL_CAPSULE | Freq: Three times a day (TID) | ORAL | Status: DC
  Administered 2024-03-13 – 2024-03-15 (×7): 100 mg via ORAL
  Filled 2024-03-13 (×7): qty 1

## 2024-03-13 MED ORDER — LACTULOSE 10 GM/15ML PO SOLN
10.0000 g | Freq: Two times a day (BID) | ORAL | Status: DC
  Administered 2024-03-13 – 2024-03-14 (×4): 10 g via ORAL
  Filled 2024-03-13 (×5): qty 15

## 2024-03-13 NOTE — Progress Notes (Addendum)
 PROGRESS NOTE    Lindsey [name]  ZOX:[id number] DOB: May 24, 1956 DOA: 03/12/2024 PCP: Pcp, No    Brief Narrative:  Lindsey [name] is a 68 y.o. female with medical history significant of drug abuse/alcohol abuse, seizures, hypertension, GERD.  Patient in the last month has been seen several times in the ER.  First time was 5/25 for a fall.  She reported that time she was wheelchair-bound and complaining of lower extremity pain.  Patient states she was trying to get out of bed and fell.  Workup done and patient was sent out of the ER.  Patient return to the ER on 6/5 due to increased weakness.  All laboratory studies were normal at that time.  Family requested that she be admitted but there was no reason for that.  Patient is brought back to the ER on 6/16 with complaints of seizure and fall.  Daughter is not available currently and patient is not a very good historian and unable to provide information.  Per chart review shows the patient stays in extended stay hotel and daughter reports that she had a seizure last night with a fall and currently patient is at her neurologic baseline.  Patient was seen by the ER doctor with multiple x-rays and CT scans done without any findings.  Laboratory data was done as well showing a possible urinary tract infection.  Patient denies any current symptoms of UTI.       Assessment and Plan: Seizures - Known history - Seizure precautions - Treat possible UTI --Valproic  acid level 21 --Keppra  level pending - Ammonia slightly elevated so we will use as needed lactulose -neurology recommendations:  checking keppra  and depakote  and ammonia levels.  increase Keppra  from 1000 mg BID to 1500 mg BID, and depakote  at 750 mg BID. He did not think she needed to be transferred to [facility] unless she has ongoing seizures.  -No further seizures in hospital - Verified with pharmacy that most recent doses are the following:  Keppra  1000mg  po BID and Depakote  500mg  BID.   -avoid  tramadol  (on home med list)   Possible  UTI - Culture pending - Rocephin  started   Falls - Appears to be related to her seizures - Multiple x-rays and CT scans were done with no apparent injury other than soft tissue swelling - PT OT eval-currently lives in an extended stage hotel and I have concerns about family being able to care for her so may need short-term rehab       DVT prophylaxis: enoxaparin  (LOVENOX ) injection 40 mg Start: 03/12/24 2200    Code Status: Full Code   Disposition Plan:  Level of care: Med-Surg Status is: Inpatient     Consultants:  Neurology (phone)   Subjective: Continues to complain of thigh pain   Objective: Vitals:   03/12/24 1751 03/12/24 2025 03/13/24 0023 03/13/24 0457  BP:  138/74 126/63 (!) 128/58  Pulse:  (!) 101 (!) 106 99  Resp:  20 20 16   Temp:  (!) 97.4 F (36.3 C) 98.2 F (36.8 C) (!) 97.5 F (36.4 C)  TempSrc:   Oral Oral  SpO2:  100% 95% 93%  Weight: 93.9 kg     Height: 5' 8 (1.727 m)       Intake/Output Summary (Last 24 hours) at 03/13/2024 1020 Last data filed at 03/13/2024 0900 Gross per 24 hour  Intake 165 ml  Output --  Net 165 ml   Filed Weights   03/12/24 1751  Weight: 93.9 kg  Examination:   General: Appearance:    Obese female in no acute distress     Lungs:      respirations unlabored  Heart:    Normal heart rate.    MS:   All extremities are intact.    Neurologic:   Awake, alert       Data Reviewed: I have personally reviewed following labs and imaging studies  CBC: Recent Labs  Lab 03/12/24 1253 03/13/24 0553  WBC 8.6 7.8  NEUTROABS 5.5  --   HGB 11.4* 10.6*  HCT 36.4 33.8*  MCV 92.9 92.6  PLT 390 380   Basic Metabolic Panel: Recent Labs  Lab 03/12/24 1253 03/13/24 0553  NA 138 143  K 4.2 4.0  CL 102 107  CO2 28 26  GLUCOSE 79 97  BUN 14 11  CREATININE 0.88 0.87  CALCIUM  9.1 8.6*   GFR: Estimated Creatinine Clearance: 76.2 mL/min (by C-G formula based on SCr  of 0.87 mg/dL). Liver Function Tests: Recent Labs  Lab 03/12/24 1253 03/13/24 0553  AST 14* 14*  ALT 11 9  ALKPHOS 99 103  BILITOT 0.6 0.3  PROT 6.9 5.9*  ALBUMIN 3.4* 2.8*   No results for input(s): LIPASE, AMYLASE in the last 168 hours. Recent Labs  Lab 03/12/24 2048  AMMONIA 48*   Coagulation Profile: No results for input(s): INR, PROTIME in the last 168 hours. Cardiac Enzymes: No results for input(s): CKTOTAL, CKMB, CKMBINDEX, TROPONINI in the last 168 hours. BNP (last 3 results) No results for input(s): PROBNP in the last 8760 hours. HbA1C: No results for input(s): HGBA1C in the last 72 hours. CBG: No results for input(s): GLUCAP in the last 168 hours. Lipid Profile: No results for input(s): CHOL, HDL, LDLCALC, TRIG, CHOLHDL, LDLDIRECT in the last 72 hours. Thyroid Function Tests: No results for input(s): TSH, T4TOTAL, FREET4, T3FREE, THYROIDAB in the last 72 hours. Anemia Panel: No results for input(s): VITAMINB12, FOLATE, FERRITIN, TIBC, IRON, RETICCTPCT in the last 72 hours. Sepsis Labs: No results for input(s): PROCALCITON, LATICACIDVEN in the last 168 hours.  No results found for this or any previous visit (from the past 240 hours).       Radiology Studies: DG Shoulder Right Result Date: 03/12/2024 CLINICAL DATA:  Pain after fall EXAM: RIGHT SHOULDER - 4 VIEW COMPARISON:  None Available. FINDINGS: Osteopenia. Preserved joint spaces. Small osteophytes seen of the [facility] joint and glenohumeral joint. Overlapping cardiac leads and gown snaps. IMPRESSION: Osteopenia.  Mild degenerative changes. Electronically Signed   By: Adrianna Horde M.D.   On: 03/12/2024 14:05   DG Tibia/Fibula Right Result Date: 03/12/2024 CLINICAL DATA:  Lower leg pain after falling. EXAM: RIGHT TIBIA AND FIBULA - 2 VIEW COMPARISON:  Radiographs 02/19/2024. FINDINGS: Stable postsurgical changes from right tibial intramedullary nail  fixation. The more distal of the 2 proximal interlocking screws is chronically fracture. There is no hardware loosening. Stable posttraumatic deformities of the mid tibial and distal fibular shafts from old healed fractures. No evidence of acute fracture, dislocation or unexpected foreign body. IMPRESSION: No acute osseous findings. Stable postsurgical and posttraumatic changes as described. Electronically Signed   By: Elmon Hagedorn M.D.   On: 03/12/2024 12:34   DG Humerus Right Result Date: 03/12/2024 CLINICAL DATA:  Upper arm pain after falling.  Seizure last night. EXAM: RIGHT HUMERUS - 2+ VIEW COMPARISON:  Right shoulder radiographs 02/06/2015. FINDINGS: There is little difference in the projection of the proximal upper arm on the submitted two views. The mineralization  and alignment are normal. There is no evidence of acute fracture or dislocation. Mild glenohumeral and acromioclavicular degenerative changes. IMPRESSION: No evidence of acute fracture or dislocation. Dedicated shoulder radiographs should be considered given the limitations of this study. Electronically Signed   By: Elmon Hagedorn M.D.   On: 03/12/2024 12:31   CT HEAD WO CONTRAST Result Date: 03/12/2024 CLINICAL DATA:  Head trauma, seizure and syncope last night. Fall with right arm pain. EXAM: CT HEAD WITHOUT CONTRAST CT CERVICAL SPINE WITHOUT CONTRAST TECHNIQUE: Multidetector CT imaging of the head and cervical spine was performed following the standard protocol without intravenous contrast. Multiplanar CT image reconstructions of the cervical spine were also generated. RADIATION DOSE REDUCTION: This exam was performed according to the departmental dose-optimization program which includes automated exposure control, adjustment of the mA and/or kV according to patient size and/or use of iterative reconstruction technique. COMPARISON:  CT head 03/01/2024, CT cervical spine 02/19/2024. FINDINGS: CT HEAD FINDINGS Brain: No acute  intracranial hemorrhage. No CT evidence of acute infarct. Nonspecific hypoattenuation in the periventricular and subcortical white matter favored to reflect chronic microvascular ischemic changes. Similar parenchymal volume loss. No edema, mass effect, or midline shift. The basilar cisterns are patent. Ventricles: The ventricles are normal. Vascular: No hyperdense vessel or unexpected calcification. Skull: No acute or aggressive finding. Orbits: Orbits are symmetric. Sinuses: Secretions in the left sphenoid sinus similar to prior. Other: Mastoid air cells are clear. Mild soft tissue swelling in the right forehead. CT CERVICAL SPINE FINDINGS Alignment: Cervical lordosis is maintained. Similar trace retrolisthesis of C4 on C5. No facet subluxation or dislocation. Skull base and vertebrae: No compression fracture or displaced fracture in the cervical spine. Similar chronic ossified fragments extending into the spinal canal at the level of C5. No suspicious osseous lesion. Soft tissues and spinal canal: No prevertebral fluid or swelling. No visible canal hematoma. Disc levels: Mild intervertebral disc space narrowing. Osseous fragments at C5 along the right dorsal aspect of the spinal canal with similar degree of spinal canal stenosis. Facet arthrosis at multiple levels. Foraminal narrowing is most pronounced on the left at C4-5. Upper chest: Negative. Other: None. IMPRESSION: No CT evidence of acute intracranial abnormality. No acute fracture or traumatic malalignment of the cervical spine. Mild soft tissue swelling in the right forehead. 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 Chronic microvascular ischemic changes and mild parenchymal volume loss. Electronically Signed   By: Denny Flack M.D.   On: 03/12/2024 12:22   CT CERVICAL SPINE WO CONTRAST Result Date: 03/12/2024 CLINICAL DATA:  Head trauma, seizure and syncope last night. Fall with right arm  pain. EXAM: CT HEAD WITHOUT CONTRAST CT CERVICAL SPINE WITHOUT CONTRAST TECHNIQUE: Multidetector CT imaging of the head and cervical spine was performed following the standard protocol without intravenous contrast. Multiplanar CT image reconstructions of the cervical spine were also generated. RADIATION DOSE REDUCTION: This exam was performed according to the departmental dose-optimization program which includes automated exposure control, adjustment of the mA and/or kV according to patient size and/or use of iterative reconstruction technique. COMPARISON:  CT head 03/01/2024, CT cervical spine 02/19/2024. FINDINGS: CT HEAD FINDINGS Brain: No acute intracranial hemorrhage. No CT evidence of acute infarct. Nonspecific hypoattenuation in the periventricular and subcortical white matter favored to reflect chronic microvascular ischemic changes. Similar parenchymal volume loss. No edema, mass effect, or midline shift. The basilar cisterns are patent. Ventricles: The ventricles are normal. Vascular: No hyperdense vessel or unexpected calcification.  Skull: No acute or aggressive finding. Orbits: Orbits are symmetric. Sinuses: Secretions in the left sphenoid sinus similar to prior. Other: Mastoid air cells are clear. Mild soft tissue swelling in the right forehead. CT CERVICAL SPINE FINDINGS Alignment: Cervical lordosis is maintained. Similar trace retrolisthesis of C4 on C5. No facet subluxation or dislocation. Skull base and vertebrae: No compression fracture or displaced fracture in the cervical spine. Similar chronic ossified fragments extending into the spinal canal at the level of C5. No suspicious osseous lesion. Soft tissues and spinal canal: No prevertebral fluid or swelling. No visible canal hematoma. Disc levels: Mild intervertebral disc space narrowing. Osseous fragments at C5 along the right dorsal aspect of the spinal canal with similar degree of spinal canal stenosis. Facet arthrosis at multiple levels.  Foraminal narrowing is most pronounced on the left at C4-5. Upper chest: Negative. Other: None. IMPRESSION: No CT evidence of acute intracranial abnormality. No acute fracture or traumatic malalignment of the cervical spine. Mild soft tissue swelling in the right forehead. 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 Chronic microvascular ischemic changes and mild parenchymal volume loss. Electronically Signed   By: Denny Flack M.D.   On: 03/12/2024 12:22        Scheduled Meds:  divalproex   750 mg Oral BID   enoxaparin  (LOVENOX ) injection  40 mg Subcutaneous Q24H   lactulose  10 g Oral BID   levETIRAcetam   1,500 mg Oral BID   pantoprazole   40 mg Oral Daily   Continuous Infusions:  cefTRIAXone  (ROCEPHIN )  IV 1 g (03/12/24 1712)     LOS: 0 days    Time spent: 45 minutes spent on chart review, discussion with nursing staff, consultants, updating family and interview/physical exam; more than 50% of that time was spent in counseling and/or coordination of care.    Enrigue Harvard, DO [facility] Available via Epic secure chat 7am-7pm After these hours, please refer to coverage provider listed on [email or web address] 03/13/2024, 10:20 AM
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
## 2024-03-14 DIAGNOSIS — J449 Chronic obstructive pulmonary disease, unspecified: Secondary | ICD-10-CM | POA: Diagnosis not present

## 2024-03-14 DIAGNOSIS — I10 Essential (primary) hypertension: Secondary | ICD-10-CM | POA: Diagnosis not present

## 2024-03-14 DIAGNOSIS — K219 Gastro-esophageal reflux disease without esophagitis: Secondary | ICD-10-CM | POA: Diagnosis not present

## 2024-03-14 DIAGNOSIS — R296 Repeated falls: Secondary | ICD-10-CM

## 2024-03-14 DIAGNOSIS — R569 Unspecified convulsions: Secondary | ICD-10-CM | POA: Diagnosis not present

## 2024-03-14 DIAGNOSIS — N3000 Acute cystitis without hematuria: Secondary | ICD-10-CM

## 2024-03-14 LAB — BASIC METABOLIC PANEL WITH GFR
Anion gap: 8 (ref 5–15)
BUN: 15 mg/dL (ref 8–23)
CO2: 24 mmol/L (ref 22–32)
Calcium: 8.6 mg/dL — ABNORMAL LOW (ref 8.9–10.3)
Chloride: 103 mmol/L (ref 98–111)
Creatinine, Ser: 0.67 mg/dL (ref 0.44–1.00)
GFR, Estimated: >60 mL/min (ref >60)
Glucose, Bld: 99 mg/dL (ref 70–99)
Potassium: 3.6 mmol/L (ref 3.5–5.1)
Sodium: 135 mmol/L (ref 135–145)

## 2024-03-14 LAB — URINE CULTURE: Culture: 60000 — AB

## 2024-03-14 LAB — CBC
HCT: 39.3 % (ref 36.0–46.0)
Hemoglobin: 12.3 g/dL (ref 12.0–15.0)
MCH: 29.2 pg (ref 26.0–34.0)
MCHC: 31.3 g/dL (ref 30.0–36.0)
MCV: 93.3 fL (ref 80.0–100.0)
Platelets: 387 10*3/uL (ref 150–400)
RBC: 4.21 MIL/uL (ref 3.87–5.11)
RDW: 14.5 % (ref 11.5–15.5)
WBC: 9.6 10*3/uL (ref 4.0–10.5)
nRBC: 0 % (ref 0.0–0.2)

## 2024-03-14 MED ORDER — SODIUM CHLORIDE 0.9 % IV SOLN
2.0000 g | INTRAVENOUS | Status: DC
  Administered 2024-03-14: 2 g via INTRAVENOUS
  Filled 2024-03-14: qty 20

## 2024-03-14 MED ORDER — FOSFOMYCIN TROMETHAMINE 3 G PO PACK
3.0000 g | PACK | Freq: Once | ORAL | Status: AC
  Administered 2024-03-14: 3 g via ORAL
  Filled 2024-03-14: qty 3

## 2024-03-14 NOTE — TOC PASRR Note (Signed)
 Transition of Care (TOC) -30 day Note      MUST [id number]     To Whom it May Concern:     Please be advised that the above patient will require a short-term nursing home stay, anticipated 30 days or less rehabilitation and strengthening. The plan is for return home.

## 2024-03-14 NOTE — Plan of Care (Signed)
   Problem: Nutrition: Goal: Adequate nutrition will be maintained Outcome: Progressing   Problem: Pain Managment: Goal: General experience of comfort will improve and/or be controlled Outcome: Progressing   Problem: Safety: Goal: Ability to remain free from injury will improve Outcome: Progressing
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
## 2024-03-14 NOTE — Progress Notes (Signed)
 PROGRESS NOTE    Lindsey [name]  ZOX:[id number] DOB: August 23, 1956 DOA: 03/12/2024 PCP: Pcp, No    Chief Complaint  Patient presents with   Seizures   Arm Injury   Fall    Brief Narrative:  Lindsey [name] is a 68 y.o. female with medical history significant of drug abuse/alcohol abuse, seizures, hypertension, GERD.  Patient in the last month has been seen several times in the ER.  First time was 5/25 for a fall.  She reported that time she was wheelchair-bound and complaining of lower extremity pain.  Patient states she was trying to get out of bed and fell.  Workup done and patient was sent out of the ER.  Patient return to the ER on 6/5 due to increased weakness.  All laboratory studies were normal at that time.  Family requested that she be admitted but there was no reason for that.  Patient is brought back to the ER on 6/16 with complaints of seizure and fall.  Daughter is not available currently and patient is not a very good historian and unable to provide information.  Per chart review shows the patient stays in extended stay hotel and daughter reports that she had a seizure last night with a fall and currently patient is at her neurologic baseline.  Patient was seen by the ER doctor with multiple x-rays and CT scans done without any findings.  Laboratory data was done as well showing a possible urinary tract infection.  Patient denies any current symptoms of UTI.    Assessment & Plan:   Principal Problem:   Seizures (HCC) Active Problems:   Essential hypertension   Falls   GERD (gastroesophageal reflux disease)   COPD (chronic obstructive pulmonary disease) (HCC)  #1 seizures -Patient with history of seizures. - Patient with concerns for UTI - Valproic  acid level noted at 21. - Keppra  levels not ordered and as such we will order. - Dr. Mikle Alexanders spoke with neurology who recommended checking Keppra , Depakote  and ammonia levels - Neurology also recommending increasing Keppra  from  1000 mg twice daily to 1500 mg twice daily and Depakote  at 750 mg twice daily. - Patient with no further seizures noted. - Per Dr. Bevin Bucks who verified with pharmacy patient's most recent dosage prior to admission with Keppra  1000 mg twice daily and Depakote  500 mg twice daily. - Avoid tramadol . - Outpatient follow-up with neurology.  2.  Enterobacter clocae UTI -Urine cultures with 60,000 colonies of Enterobacter CLO clear. - Patient on IV Rocephin  with received 3 days of IV Rocephin  during the hospitalization. - Will give a dose of fosfomycin x 1.  3.  Falls -Patient noted to have falls felt likely secondary to seizures. - Patient with plain films done and CT scans with no acute abnormalities noted other than some soft tissue swelling. - Patient noted to be living in an extended stay hotel prior to admission and concerns about family being able to care for her and as such will need short-term rehab per PT recommendations. - TOC consulted for SNF placement.  4.  GERD -PPI.  5.  Hypertension -Patient noted to be on Hyzaar and Norvasc  prior to admission and per med rec was not taking them.. - BP currently stable. - Continue to hold antihypertensive medications.  6.  COPD -Stable. - Continue Breo ellipta - Nebs as needed.   DVT prophylaxis: Lovenox  Code Status: Full Family Communication: Updated patient.  Updated daughter on telephone. Disposition: SNF when bed available  Status is: Inpatient  Remains inpatient appropriate because: Unsafe disposition   Consultants:  Curb sided neurology  Procedures: CT head CT C-spine 03/12/2024 Plain films of the right tib-fib 03/12/2024 Plain films of the right humerus 03/12/2024 Plain films of the right shoulder 03/12/2024  Antimicrobials:  Anti-infectives (From admission, onward)    Start     Dose/Rate Route Frequency Ordered Stop   03/14/24 1600  fosfomycin (MONUROL) packet 3 g        3 g Oral  Once 03/14/24 1503 03/14/24 1614    03/14/24 1000  cefTRIAXone  (ROCEPHIN ) 2 g in sodium chloride  0.9 % 100 mL IVPB  Status:  Discontinued        2 g 200 mL/hr over 30 Minutes Intravenous Every 24 hours 03/14/24 0832 03/14/24 1503   03/12/24 1600  cefTRIAXone  (ROCEPHIN ) 1 g in sodium chloride  0.9 % 100 mL IVPB  Status:  Discontinued        1 g 200 mL/hr over 30 Minutes Intravenous Every 24 hours 03/12/24 1547 03/14/24 0832         Subjective: Patient laying in bed, on the telephone with her daughter.  Denies any chest pain or shortness of breath.  No abdominal pain.  Denies any seizure-like activity.  Objective: Vitals:   03/14/24 0258 03/14/24 0602 03/14/24 0842 03/14/24 1215  BP: 129/68 133/69 (!) 117/103 (!) 121/108  Pulse: (!) 105 (!) 109 (!) 105 (!) 105  Resp: 15 16  20   Temp: 98.1 F (36.7 C) 98.6 F (37 C) 97.8 F (36.6 C) 98.2 F (36.8 C)  TempSrc: Oral Oral Oral Oral  SpO2: 96% 93% 96% 97%  Weight:      Height:        Intake/Output Summary (Last 24 hours) at 03/14/2024 1843 Last data filed at 03/14/2024 1500 Gross per 24 hour  Intake 100 ml  Output 300 ml  Net -200 ml   Filed Weights   03/12/24 1751  Weight: 93.9 kg    Examination:  General exam: Appears calm and comfortable  Respiratory system: Clear to auscultation. Respiratory effort normal. Cardiovascular system: S1 & S2 heard, RRR. No JVD, murmurs, rubs, gallops or clicks.  Trace bilateral lower extremity edema.  Gastrointestinal system: Abdomen is nondistended, soft and nontender. No organomegaly or masses felt. Normal bowel sounds heard. Central nervous system: Alert and oriented. No focal neurological deficits. Extremities: Symmetric 5 x 5 power. Skin: No rashes, lesions or ulcers Psychiatry: Judgement and insight appear normal. Mood & affect appropriate.     Data Reviewed: I have personally reviewed following labs and imaging studies  CBC: Recent Labs  Lab 03/12/24 1253 03/13/24 0553 03/14/24 0503  WBC 8.6 7.8 9.6   NEUTROABS 5.5  --   --   HGB 11.4* 10.6* 12.3  HCT 36.4 33.8* 39.3  MCV 92.9 92.6 93.3  PLT 390 380 387    Basic Metabolic Panel: Recent Labs  Lab 03/12/24 1253 03/13/24 0553 03/14/24 0503  NA 138 143 135  K 4.2 4.0 3.6  CL 102 107 103  CO2 28 26 24   GLUCOSE 79 97 99  BUN 14 11 15   CREATININE 0.88 0.87 0.67  CALCIUM  9.1 8.6* 8.6*    GFR: Estimated Creatinine Clearance: 82.9 mL/min (by C-G formula based on SCr of 0.67 mg/dL).  Liver Function Tests: Recent Labs  Lab 03/12/24 1253 03/13/24 0553  AST 14* 14*  ALT 11 9  ALKPHOS 99 103  BILITOT 0.6 0.3  PROT 6.9 5.9*  ALBUMIN 3.4* 2.8*  CBG: No results for input(s): GLUCAP in the last 168 hours.   Recent Results (from the past 240 hours)  Urine Culture     Status: Abnormal   Collection Time: 03/12/24  1:44 PM   Specimen: Urine, Clean Catch  Result Value Ref Range Status   Specimen Description   Final    URINE, CLEAN CATCH Performed at [facility], [address] [place], [place] [address]    Special Requests   Final    NONE Performed at [facility], [address] [place], [place] [address]    Culture 60,000 COLONIES/mL ENTEROBACTER CLOACAE (A)  Final   Report Status 03/14/2024 FINAL  Final   Organism ID, Bacteria ENTEROBACTER CLOACAE (A)  Final      Susceptibility   Enterobacter cloacae - MIC*    CEFEPIME  <=0.12 SENSITIVE Sensitive     CIPROFLOXACIN <=0.25 SENSITIVE Sensitive     GENTAMICIN <=1 SENSITIVE Sensitive     IMIPENEM 1 SENSITIVE Sensitive     NITROFURANTOIN 64 INTERMEDIATE Intermediate     TRIMETH/SULFA <=20 SENSITIVE Sensitive     PIP/TAZO <=4 SENSITIVE Sensitive ug/mL    * 60,000 COLONIES/mL ENTEROBACTER CLOACAE         Radiology Studies: No results found.      Scheduled Meds:  calcium  carbonate  1 tablet Oral TID   diclofenac  Sodium  2 g Topical QID   divalproex   750 mg Oral BID   enoxaparin  (LOVENOX ) injection  40 mg  Subcutaneous Q24H   fluticasone  furoate-vilanterol  1 puff Inhalation Daily   gabapentin  100 mg Oral TID   lactulose  10 g Oral BID   levETIRAcetam   1,500 mg Oral BID   loratadine  10 mg Oral QHS   pantoprazole   40 mg Oral Daily   Continuous Infusions:     LOS: 1 day    Time spent: 35 minutes    Hilda Lovings, MD [facility]   To contact the attending provider between 7A-7P or the covering provider during after hours 7P-7A, please log into the web site [email or web address] and access using universal Donna password for that web site. If you do not have the password, please call the hospital operator.  03/14/2024, 6:43 PM

## 2024-03-14 NOTE — TOC Progression Note (Addendum)
 Transition of Care [facility]) - Progression Note    Patient Details  Name: Lindsey [name] MRN: [id number] Date of Birth: 1956/04/27  [facility]) CM/SW Contact  Domonic Hiscox, Thersia Flax, RN Phone Number: 03/14/2024, 2:30 PM  Clinical Narrative: Patient/dtr [name] agree to  ST SNf-faxed out await bed offers & level 2 pasrr.Has gone to [facility] in past.  ST SNF choices  [address] [facility] Provider Request Status Stars Address Phone Fax Patient Preferred  [facility] Preferred SNF  Accepted   [address], [name] [address] [phone] --  [facility] SNF  Accepted   [address], [place] [address] [phone] --  [facility] Preferred SNF  Accepted 1 [address], [place] [address] [phone] --  [facility] CARE SNF  Accepted 1 [address] [place] [address] [phone]       -patient has been given bed offers-she will discuss with her dtr [name]-awaiting choice & pasrr.  Expected Discharge Plan: Skilled Nursing Facility Barriers to Discharge: Awaiting State Approval Sherel Dikes)  Expected Discharge Plan and Services   Discharge Planning Services: CM Consult   Living arrangements for the past 2 months: Hotel/Motel                                       Social Determinants of Health (SDOH) Interventions SDOH Screenings   Food Insecurity: Food Insecurity Present (03/12/2024)  Housing: High Risk (03/12/2024)  Transportation Needs: Unmet Transportation Needs (03/12/2024)  Utilities: Not At Risk (03/12/2024)  Social Connections: Moderately Isolated (03/12/2024)  Tobacco Use: Medium Risk (03/12/2024)    Readmission Risk Interventions    03/13/2024   12:02 PM 09/09/2022   12:02 PM 09/08/2022    1:22 PM  Readmission Risk Prevention Plan  Transportation Screening Complete Complete Complete  PCP or Specialist Appt within 5-7  Days  Complete Complete  PCP or Specialist Appt within 3-5 Days Complete    Home Care Screening  Complete Complete  Medication Review (RN CM)  Complete Complete  Social Work Consult for Recovery Care Planning/Counseling Complete    Palliative Care Screening Not Applicable    Medication Review Oceanographer) Complete

## 2024-03-15 DIAGNOSIS — J449 Chronic obstructive pulmonary disease, unspecified: Secondary | ICD-10-CM | POA: Diagnosis not present

## 2024-03-15 DIAGNOSIS — R296 Repeated falls: Secondary | ICD-10-CM | POA: Diagnosis not present

## 2024-03-15 DIAGNOSIS — K219 Gastro-esophageal reflux disease without esophagitis: Secondary | ICD-10-CM | POA: Diagnosis not present

## 2024-03-15 DIAGNOSIS — N3000 Acute cystitis without hematuria: Secondary | ICD-10-CM | POA: Diagnosis not present

## 2024-03-15 DIAGNOSIS — G40909 Epilepsy, unspecified, not intractable, without status epilepticus: Secondary | ICD-10-CM

## 2024-03-15 LAB — RENAL FUNCTION PANEL
Albumin: 2.8 g/dL — ABNORMAL LOW (ref 3.5–5.0)
Anion gap: 12 (ref 5–15)
BUN: 10 mg/dL (ref 8–23)
CO2: 21 mmol/L — ABNORMAL LOW (ref 22–32)
Calcium: 8.9 mg/dL (ref 8.9–10.3)
Chloride: 103 mmol/L (ref 98–111)
Creatinine, Ser: 0.66 mg/dL (ref 0.44–1.00)
GFR, Estimated: >60 mL/min (ref >60)
Glucose, Bld: 102 mg/dL — ABNORMAL HIGH (ref 70–99)
Phosphorus: 2.8 mg/dL (ref 2.5–4.6)
Potassium: 3.3 mmol/L — ABNORMAL LOW (ref 3.5–5.1)
Sodium: 136 mmol/L (ref 135–145)

## 2024-03-15 LAB — CBC
HCT: 34.5 % — ABNORMAL LOW (ref 36.0–46.0)
Hemoglobin: 10.7 g/dL — ABNORMAL LOW (ref 12.0–15.0)
MCH: 28.4 pg (ref 26.0–34.0)
MCHC: 31 g/dL (ref 30.0–36.0)
MCV: 91.5 fL (ref 80.0–100.0)
Platelets: 360 10*3/uL (ref 150–400)
RBC: 3.77 MIL/uL — ABNORMAL LOW (ref 3.87–5.11)
RDW: 14.5 % (ref 11.5–15.5)
WBC: 7.1 10*3/uL (ref 4.0–10.5)
nRBC: 0 % (ref 0.0–0.2)

## 2024-03-15 LAB — LEVETIRACETAM LEVEL: Levetiracetam Lvl: 96.2 ug/mL — ABNORMAL HIGH (ref 10.0–40.0)

## 2024-03-15 LAB — MAGNESIUM: Magnesium: 1.5 mg/dL — ABNORMAL LOW (ref 1.7–2.4)

## 2024-03-15 MED ORDER — LACTULOSE 10 GM/15ML PO SOLN: 10.0000 g | Freq: Two times a day (BID) | ORAL | 0 refills | Status: DC

## 2024-03-15 MED ORDER — MAGNESIUM SULFATE 4 GM/100ML IV SOLN
4.0000 g | Freq: Once | INTRAVENOUS | Status: AC
  Administered 2024-03-15: 4 g via INTRAVENOUS
  Filled 2024-03-15: qty 100

## 2024-03-15 MED ORDER — MAGNESIUM OXIDE -MG SUPPLEMENT 400 (240 MG) MG PO TABS: 400.0000 mg | ORAL_TABLET | Freq: Two times a day (BID) | ORAL | Status: DC

## 2024-03-15 MED ORDER — LEVETIRACETAM 750 MG PO TABS: 1500.0000 mg | ORAL_TABLET | Freq: Two times a day (BID) | ORAL | 1 refills | Status: DC

## 2024-03-15 MED ORDER — OXYCODONE HCL 5 MG PO CAPS: 5.0000 mg | ORAL_CAPSULE | ORAL | 0 refills | Status: DC | PRN

## 2024-03-15 MED ORDER — MAGNESIUM OXIDE -MG SUPPLEMENT 400 (240 MG) MG PO TABS: 400.0000 mg | ORAL_TABLET | Freq: Two times a day (BID) | ORAL | Status: AC

## 2024-03-15 MED ORDER — DICLOFENAC SODIUM 1 % EX GEL: 2.0000 g | Freq: Four times a day (QID) | CUTANEOUS | Status: DC

## 2024-03-15 MED ORDER — POTASSIUM CHLORIDE CRYS ER 10 MEQ PO TBCR
40.0000 meq | EXTENDED_RELEASE_TABLET | Freq: Once | ORAL | Status: AC
  Administered 2024-03-15: 40 meq via ORAL
  Filled 2024-03-15: qty 4

## 2024-03-15 MED ORDER — LORATADINE 10 MG PO TABS: 10.0000 mg | ORAL_TABLET | Freq: Every day | ORAL | Status: AC

## 2024-03-15 MED ORDER — DIVALPROEX SODIUM ER 250 MG PO TB24: 750.0000 mg | ORAL_TABLET | Freq: Two times a day (BID) | ORAL | 1 refills | Status: DC

## 2024-03-15 MED ORDER — PANTOPRAZOLE SODIUM 40 MG PO TBEC: 40.0000 mg | DELAYED_RELEASE_TABLET | Freq: Every day | ORAL | 0 refills | Status: AC
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
## 2024-03-15 NOTE — Discharge Summary (Signed)
 Physician Discharge Summary  Aunika Kirsten CBJ:[id number] DOB: May 16, 1956 DOA: 03/12/2024  PCP: Pcp, No  Admit date: 03/12/2024 Discharge date: 03/15/2024  Time spent: 60 minutes  Recommendations for Outpatient Follow-up:  Follow-up with MD at SNF.  On follow-up patient will need a basic metabolic profile, magnesium  level checked in 1 week to follow-up on electrolytes and renal function.  Blood pressure also needs to be followed up upon. Follow-up with [facility] in 2 weeks for follow-up on seizures and further medication management.   Discharge Diagnoses:  Principal Problem:   Seizures (HCC) Active Problems:   Essential hypertension   Falls   GERD (gastroesophageal reflux disease)   COPD (chronic obstructive pulmonary disease) (HCC)   Acute cystitis without hematuria   Discharge Condition: Stable and improved.  Diet recommendation: Regular  Filed Weights   03/12/24 1751  Weight: 93.9 kg    History of present illness:  HPI per Dr. Gaynelle Keeling Hickox is a 68 y.o. female with medical history significant of drug abuse/alcohol abuse, seizures, hypertension, GERD.  Patient in the last month has been seen several times in the ER.  First time was 5/25 for a fall.  She reported that time she was wheelchair-bound and complaining of lower extremity pain.  Patient states she was trying to get out of bed and fell.  Workup done and patient was sent out of the ER.  Patient return to the ER on 6/5 due to increased weakness.  All laboratory studies were normal at that time.  Family requested that she be admitted but there was no reason for that.  Patient is brought back to the ER on 6/16 with complaints of seizure and fall.  Daughter is not available currently and patient is not a very good historian and unable to provide information.  Per chart review shows the patient stays in extended stay hotel and daughter reports that she had a seizure last night with a fall and  currently patient is at her neurologic baseline.  Patient was seen by the ER doctor with multiple x-rays and CT scans done without any findings.  Laboratory data was done as well showing a possible urinary tract infection.  Patient denies any current symptoms of UTI.     Asked ER doctor to call neurology to see if patient needs to be at [facility] with her continued seizures if any adjustments need to be made to her medications.  Hospital Course:  #1 seizures -Patient with history of seizures. - Patient with concerns for UTI - Valproic  acid level noted at 21. - Keppra  levels not ordered and were pending at time of discharge.  - Dr. Mikle Alexanders spoke with neurology who recommended checking Keppra , Depakote  and ammonia levels - Neurology also recommended increasing Keppra  from 1000 mg twice daily to 1500 mg twice daily and Depakote  at 750 mg twice daily. - Patient with no further seizures noted throughout the hospitalization. - Per Dr. Mikle Alexanders who verified with pharmacy patient's most recent dosage prior to admission with Keppra  1000 mg twice daily and Depakote  500 mg twice daily. -Patient noted to have been on tramadol  prior to admission which was subsequently discontinued and will not be resumed on discharge. - Outpatient follow-up with neurology.   2.  Enterobacter clocae UTI -Urine cultures with 60,000 colonies of Enterobacter CLO clear. - Patient was on IV Rocephin  with received 3 days of IV Rocephin  during the hospitalization. - Status post 1 dose of fosfomycin.   - No further antibiotics needed.  3.  Falls -Patient noted to have falls felt likely secondary to seizures. - Patient with plain films done and CT scans with no acute abnormalities noted other than some soft tissue swelling. - Patient noted to be living in an extended stay hotel prior to admission and concerns about family being able to care for her and as such will need short-term rehab per PT recommendations. - TOC consulted for SNF  placement. -Patient will be discharged to SNF.   4.  GERD - Patient maintained on PPI.   5.  Hypertension -Patient noted to be on Hyzaar and Norvasc  prior to admission and per med rec however it was noted was not taking them.. - BP remains stable off antihypertensive medications.   - Patient be discharged off antihypertensive medications with outpatient follow-up.    6.  COPD -Stable. - Patient maintained on Breo ellipta - Nebs as needed.    Procedures: CT head CT C-spine 03/12/2024 Plain films of the right tib-fib 03/12/2024 Plain films of the right humerus 03/12/2024 Plain films of the right shoulder 03/12/2024  Consultations: Curb sided neurology    Discharge Exam: Vitals:   03/15/24 0547 03/15/24 0753  BP: 134/78   Pulse: 95   Resp: (!) 22   Temp: 97.7 F (36.5 C)   SpO2: 95% 95%    General: NAD Cardiovascular: RRR no murmurs rubs or gallops.  No JVD.  No lower extremity edema. Respiratory: Clear to auscultation bilaterally.  No wheezes, no crackles, no rhonchi.  Fair air movement.  Speaking in full sentences.  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    Ibuprofen  Swelling   Kiwi Extract Hives, Itching   Morphine  And Codeine  Hives   Penicillins Hives, Other (See Comments)   Strawberry Extract Hives, Itching        Medication List     STOP taking these medications    amLODipine  5 MG tablet Commonly known as:  NORVASC    cyclobenzaprine  5 MG tablet Commonly known as: FLEXERIL    losartan -hydrochlorothiazide  50-12.5 MG tablet Commonly known as: HYZAAR   rosuvastatin  10 MG tablet Commonly known as: CRESTOR    traMADol  50 MG tablet Commonly known as: ULTRAM        TAKE these medications    albuterol  108 (90 Base) MCG/ACT inhaler Commonly known as: VENTOLIN  HFA Inhale 2 puffs into the lungs every 4 (four) hours as needed for wheezing or shortness of breath.   Breo Ellipta 100-25 MCG/ACT Aepb Generic drug: fluticasone  furoate-vilanterol Inhale 1 puff into the lungs daily.   diclofenac  Sodium 1 % Gel Commonly known as: VOLTAREN  Apply 2 g topically 4 (four) times daily. Apply to knees/thighs   divalproex  250 MG 24 hr tablet Commonly known as: DEPAKOTE  ER Take 3 tablets (750 mg total) by mouth 2 (two) times daily. What changed:  medication strength  when to take this   gabapentin 100 MG capsule Commonly known as: NEURONTIN Take 100 mg by mouth 3 (three) times daily.   IRON PO Take 1 tablet by mouth daily.   lactulose 10 GM/15ML solution Commonly known as: CHRONULAC Take 15 mLs (10 g total) by mouth 2 (two) times daily.   levETIRAcetam  750 MG tablet Commonly known as: KEPPRA  Take 2 tablets (1,500 mg total) by mouth 2 (two) times daily. What changed:  how much to take Another medication with the same name was removed. Continue taking this medication, and follow the directions you see here.   loratadine 10 MG tablet Commonly known as: CLARITIN Take 1 tablet (10 mg total) by mouth at bedtime.   magnesium  oxide 400 (240 Mg) MG tablet Commonly known as: MAG-OX Take 1 tablet (400 mg total) by mouth 2 (two) times daily for 5 days. Start taking on: March 16, 2024   mirtazapine  7.5 MG tablet Commonly known as: REMERON  Take 7.5 mg by mouth at bedtime.   oxycodone  5 MG capsule Commonly known as: OXY-IR Take 1 capsule (5 mg total) by mouth every 4 (four) hours as needed.    pantoprazole  40 MG tablet Commonly known as: PROTONIX  Take 1 tablet (40 mg total) by mouth daily. What changed: Another medication with the same name was removed. Continue taking this medication, and follow the directions you see here.   Tylenol  8 Hour Arthritis Pain 650 MG CR tablet Generic drug: acetaminophen  Take 1,300 mg by mouth daily as needed for pain.       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    Ibuprofen  Swelling   Kiwi Extract Hives and Itching   Morphine  And Codeine  Hives   Penicillins Hives and Other (See Comments)   Strawberry Extract Hives and Itching    Contact information for follow-up providers     MD AT SNF Follow up.          [facility]. Schedule an appointment as soon as possible for a visit in 2 week(s).   Specialty: Neurology Contact information: [address] [place]  [address] [phone]             Contact information for after-discharge care     Destination     [facility] .   Service: Skilled Nursing Contact information: [address] [place]  [address] [phone]                      The results of significant diagnostics from this hospitalization (including imaging, microbiology, ancillary and laboratory) are listed below for reference.    Significant Diagnostic Studies: DG Shoulder Right Result Date: 03/12/2024 CLINICAL DATA:  Pain after fall EXAM: RIGHT SHOULDER - 4 VIEW COMPARISON:  None Available. FINDINGS: Osteopenia. Preserved joint spaces. Small osteophytes seen of the [facility] joint and glenohumeral joint. Overlapping cardiac leads and gown snaps. IMPRESSION: Osteopenia.  Mild degenerative changes. Electronically  Signed   By: Adrianna Horde M.D.   On: 03/12/2024 14:05   DG Tibia/Fibula Right Result Date: 03/12/2024 CLINICAL DATA:  Lower leg pain after falling. EXAM: RIGHT TIBIA AND FIBULA - 2 VIEW COMPARISON:  Radiographs 02/19/2024. FINDINGS: Stable postsurgical changes from right tibial intramedullary  nail fixation. The more distal of the 2 proximal interlocking screws is chronically fracture. There is no hardware loosening. Stable posttraumatic deformities of the mid tibial and distal fibular shafts from old healed fractures. No evidence of acute fracture, dislocation or unexpected foreign body. IMPRESSION: No acute osseous findings. Stable postsurgical and posttraumatic changes as described. Electronically Signed   By: Elmon Hagedorn M.D.   On: 03/12/2024 12:34   DG Humerus Right Result Date: 03/12/2024 CLINICAL DATA:  Upper arm pain after falling.  Seizure last night. EXAM: RIGHT HUMERUS - 2+ VIEW COMPARISON:  Right shoulder radiographs 02/06/2015. FINDINGS: There is little difference in the projection of the proximal upper arm on the submitted two views. The mineralization and alignment are normal. There is no evidence of acute fracture or dislocation. Mild glenohumeral and acromioclavicular degenerative changes. IMPRESSION: No evidence of acute fracture or dislocation. Dedicated shoulder radiographs should be considered given the limitations of this study. Electronically Signed   By: Elmon Hagedorn M.D.   On: 03/12/2024 12:31   CT HEAD WO CONTRAST Result Date: 03/12/2024 CLINICAL DATA:  Head trauma, seizure and syncope last night. Fall with right arm pain. EXAM: CT HEAD WITHOUT CONTRAST CT CERVICAL SPINE WITHOUT CONTRAST TECHNIQUE: Multidetector CT imaging of the head and cervical spine was performed following the standard protocol without intravenous contrast. Multiplanar CT image reconstructions of the cervical spine were also generated. RADIATION DOSE REDUCTION: This exam was performed according to the  departmental dose-optimization program which includes automated exposure control, adjustment of the mA and/or kV according to patient size and/or use of iterative reconstruction technique. COMPARISON:  CT head 03/01/2024, CT cervical spine 02/19/2024. FINDINGS: CT HEAD FINDINGS Brain: No acute intracranial hemorrhage. No CT evidence of acute infarct. Nonspecific hypoattenuation in the periventricular and subcortical white matter favored to reflect chronic microvascular ischemic changes. Similar parenchymal volume loss. No edema, mass effect, or midline shift. The basilar cisterns are patent. Ventricles: The ventricles are normal. Vascular: No hyperdense vessel or unexpected calcification. Skull: No acute or aggressive finding. Orbits: Orbits are symmetric. Sinuses: Secretions in the left sphenoid sinus similar to prior. Other: Mastoid air cells are clear. Mild soft tissue swelling in the right forehead. CT CERVICAL SPINE FINDINGS Alignment: Cervical lordosis is maintained. Similar trace retrolisthesis of C4 on C5. No facet subluxation or dislocation. Skull base and vertebrae: No compression fracture or displaced fracture in the cervical spine. Similar chronic ossified fragments extending into the spinal canal at the level of C5. No suspicious osseous lesion. Soft tissues and spinal canal: No prevertebral fluid or swelling. No visible canal hematoma. Disc levels: Mild intervertebral disc space narrowing. Osseous fragments at C5 along the right dorsal aspect of the spinal canal with similar degree of spinal canal stenosis. Facet arthrosis at multiple levels. Foraminal narrowing is most pronounced on the left at C4-5. Upper chest: Negative. Other: None. IMPRESSION: No CT evidence of acute intracranial abnormality. No acute fracture or traumatic malalignment of the cervical spine. Mild soft tissue swelling in the right forehead. 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 Chronic microvascular ischemic changes and mild parenchymal volume loss. Electronically Signed   By: Denny Flack M.D.   On: 03/12/2024 12:22   CT CERVICAL SPINE WO CONTRAST Result Date: 03/12/2024 CLINICAL DATA:  Head trauma, seizure and syncope last night. Fall with right arm pain. EXAM: CT HEAD WITHOUT CONTRAST CT CERVICAL SPINE WITHOUT CONTRAST TECHNIQUE: Multidetector CT imaging of the head and cervical spine  was performed following the standard protocol without intravenous contrast. Multiplanar CT image reconstructions of the cervical spine were also generated. RADIATION DOSE REDUCTION: This exam was performed according to the departmental dose-optimization program which includes automated exposure control, adjustment of the mA and/or kV according to patient size and/or use of iterative reconstruction technique. COMPARISON:  CT head 03/01/2024, CT cervical spine 02/19/2024. FINDINGS: CT HEAD FINDINGS Brain: No acute intracranial hemorrhage. No CT evidence of acute infarct. Nonspecific hypoattenuation in the periventricular and subcortical white matter favored to reflect chronic microvascular ischemic changes. Similar parenchymal volume loss. No edema, mass effect, or midline shift. The basilar cisterns are patent. Ventricles: The ventricles are normal. Vascular: No hyperdense vessel or unexpected calcification. Skull: No acute or aggressive finding. Orbits: Orbits are symmetric. Sinuses: Secretions in the left sphenoid sinus similar to prior. Other: Mastoid air cells are clear. Mild soft tissue swelling in the right forehead. CT CERVICAL SPINE FINDINGS Alignment: Cervical lordosis is maintained. Similar trace retrolisthesis of C4 on C5. No facet subluxation or dislocation. Skull base and vertebrae: No compression fracture or displaced fracture in the cervical spine. Similar chronic ossified fragments extending into the spinal canal at the level of C5. No suspicious osseous lesion. Soft tissues  and spinal canal: No prevertebral fluid or swelling. No visible canal hematoma. Disc levels: Mild intervertebral disc space narrowing. Osseous fragments at C5 along the right dorsal aspect of the spinal canal with similar degree of spinal canal stenosis. Facet arthrosis at multiple levels. Foraminal narrowing is most pronounced on the left at C4-5. Upper chest: Negative. Other: None. IMPRESSION: No CT evidence of acute intracranial abnormality. No acute fracture or traumatic malalignment of the cervical spine. Mild soft tissue swelling in the right forehead. 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 Chronic microvascular ischemic changes and mild parenchymal volume loss. Electronically Signed   By: Denny Flack M.D.   On: 03/12/2024 12:22   CT HEAD WO CONTRAST Result Date: 03/01/2024 CLINICAL DATA:  Transient ischemic attack. EXAM: CT HEAD WITHOUT CONTRAST TECHNIQUE: Contiguous axial images were obtained from the base of the skull through the vertex without intravenous contrast. RADIATION DOSE REDUCTION: This exam was performed according to the departmental dose-optimization program which includes automated exposure control, adjustment of the mA and/or kV according to patient size and/or use of iterative reconstruction technique. COMPARISON:  Head CT 02/19/2024 FINDINGS: Brain: No intracranial hemorrhage, mass effect, or midline shift. No hydrocephalus. The basilar cisterns are patent. Stable degree of atrophy and chronic small vessel ischemia from prior exam. No evidence of territorial infarct or acute ischemia. No extra-axial or intracranial fluid collection. Vascular: Atherosclerosis of skullbase vasculature without hyperdense vessel or abnormal calcification. Skull: No fracture or focal lesion. Sinuses/Orbits: Minimal bubbly debris in left side of sphenoid sinus. No sinus fluid levels. No mastoid effusion. Other: None. IMPRESSION: 1. No acute  intracranial abnormality. 2. Stable atrophy and chronic small vessel ischemia. Electronically Signed   By: Chadwick Colonel M.D.   On: 03/01/2024 15:13   MM 3D SCREENING MAMMOGRAM BILATERAL BREAST Result Date: 02/22/2024 CLINICAL DATA:  Screening. EXAM: DIGITAL SCREENING BILATERAL MAMMOGRAM WITH TOMOSYNTHESIS AND CAD TECHNIQUE: Bilateral screening digital craniocaudal and mediolateral oblique mammograms were obtained. Bilateral screening digital breast tomosynthesis was performed. The images were evaluated with computer-aided detection. COMPARISON:  Previous exam(s). ACR Breast Density Category b: There are scattered areas of fibroglandular density. FINDINGS: There are no findings suspicious for malignancy. IMPRESSION: No mammographic evidence of malignancy. A  result letter of this screening mammogram will be mailed directly to the patient. RECOMMENDATION: Screening mammogram in one year. (Code:SM-B-01Y) BI-RADS CATEGORY  1: Negative. Electronically Signed   By: Anna Barnes M.D.   On: 02/22/2024 18:10   VAS US  LOWER EXTREMITY VENOUS (DVT) (ONLY [facility] & [facility]) Result Date: 02/19/2024  Lower Venous DVT Study Patient Name:  PETER DAQUILA  Date of Exam:   02/19/2024 Medical Rec #: [id number]        Accession #:    [id number] Date of Birth: 10-22-1955        Patient Gender: F Patient Age:   69 years Exam Location:  [facility] Procedure:      VAS US  LOWER EXTREMITY VENOUS (DVT) Referring Phys: JON KNAPP --------------------------------------------------------------------------------  Indications: Swelling.  Comparison Study: NA Performing Technologist: Gelene Kelly RDCS  Examination Guidelines: A complete evaluation includes B-mode imaging, spectral Doppler, color Doppler, and power Doppler as needed of all accessible portions of each vessel. Bilateral testing is considered an integral part of a complete examination. Limited examinations for reoccurring indications may be performed as noted. The reflux  portion of the exam is performed with the patient in reverse Trendelenburg.  +---------+---------------+---------+-----------+----------+--------------+ RIGHT    CompressibilityPhasicitySpontaneityPropertiesThrombus Aging +---------+---------------+---------+-----------+----------+--------------+ CFV      Full           Yes      Yes                                 +---------+---------------+---------+-----------+----------+--------------+ SFJ      Full           Yes      Yes                                 +---------+---------------+---------+-----------+----------+--------------+ FV Prox  Full           Yes      Yes                                 +---------+---------------+---------+-----------+----------+--------------+ FV Mid   Full           Yes      Yes                                 +---------+---------------+---------+-----------+----------+--------------+ FV DistalFull           Yes      Yes                                 +---------+---------------+---------+-----------+----------+--------------+ PFV      Full                                                        +---------+---------------+---------+-----------+----------+--------------+ POP      Full           Yes      Yes                                 +---------+---------------+---------+-----------+----------+--------------+  PTV      Full           Yes      Yes                                 +---------+---------------+---------+-----------+----------+--------------+ PERO     Full           Yes      Yes                                 +---------+---------------+---------+-----------+----------+--------------+ Gastroc  Full                                                        +---------+---------------+---------+-----------+----------+--------------+ GSV      Full           Yes      Yes                                  +---------+---------------+---------+-----------+----------+--------------+   +----+---------------+---------+-----------+----------+--------------+ LEFTCompressibilityPhasicitySpontaneityPropertiesThrombus Aging +----+---------------+---------+-----------+----------+--------------+ CFV Full           Yes      Yes                                 +----+---------------+---------+-----------+----------+--------------+    Findings reported to Dr. Monnie Anthony at 3:35.  Summary: RIGHT: - No evidence of deep vein thrombosis in the lower extremity. No indirect evidence of obstruction proximal to the inguinal ligament.  - No cystic structure found in the popliteal fossa.  LEFT: - No evidence of common femoral vein obstruction.   *See table(s) above for measurements and observations. Electronically signed by Irvin Mantel on 02/19/2024 at 7:42:24 PM.    Final    CT Cervical Spine Wo Contrast Result Date: 02/19/2024 EXAM: CT CERVICAL SPINE WITHOUT CONTRAST 02/19/2024 01:01:35 PM TECHNIQUE: CT of the cervical was performed without the administration of intravenous contrast. Multiplanar reformatted images are provided for review. Automated exposure control, iterative reconstruction, and/or weight based adjustment of the mA/kV was utilized to reduce the radiation dose to as low as reasonably achievable. COMPARISON: CT of the cervical spine 02/27/2022. CLINICAL HISTORY: Neck trauma (Age >= 65y). Fall. FINDINGS: CERVICAL SPINE: BONES/ALIGNMENT: There is no acute fracture or traumatic malalignment. DEGENERATIVE CHANGES: Slight degenerative retrolisthesis again seen at C4-5. Chronic ossified fragments extending into the canal at C4-5 are stable. SOFT TISSUES: There is no prevertebral soft tissue swelling. IMPRESSION: 1. No acute fracture or traumatic malalignment of the cervical spine. 2. Stable slight degenerative retrolisthesis at C4-5 with chronic ossified fragments extending into the canal. Electronically signed by:  Audree Leas MD 02/19/2024 01:38 PM EDT RP Workstation: [id number]   CT Head Wo Contrast Result Date: 02/19/2024 EXAM: CT HEAD WITHOUT 06/29/2020 TECHNIQUE: CT of the head was performed without the administration of intravenous contrast. Automated exposure control, iterative reconstruction, and/or weight based adjustment of the mA/kV was utilized to reduce the radiation dose to as low as reasonably achievable. COMPARISON: None available. CLINICAL HISTORY: Head trauma, minor (Age >= 65y).  Fall. FINDINGS: BRAIN AND VENTRICLES: There is no acute  intracranial hemorrhage, mass effect or midline shift. No abnormal extra-axial fluid collection. The gray-white differentiation is maintained without evidence of an acute infarct. There is no evidence of hydrocephalus. White matter disease and atrophy are mildly advanced for age with slight progression since the prior exam. ORBITS: The visualized portion of the orbits demonstrate no acute abnormality. SINUSES: Secretions are present in the left sphenoid sinus. The paranasal sinuses are otherwise clear. SOFT TISSUES AND SKULL: No acute abnormality of the visualized skull or soft tissues. IMPRESSION: 1. No acute intracranial abnormality. 2. Mildly advanced white matter disease and atrophy with slight progression since the prior exam. Electronically signed by: Audree Leas MD 02/19/2024 01:34 PM EDT RP Workstation: [id number]   DG Tibia/Fibula Right Result Date: 02/19/2024 CLINICAL DATA:  Status post fall.  Pain. EXAM: RIGHT TIBIA AND FIBULA - 2 VIEW COMPARISON:  09/16/2021 FINDINGS: Previous rod and screw fixation of the right tibia with signs of remote healed fracture deformity involving the proximal and mid diaphysis. Remote healed fracture deformity involving the distal shaft of the fibula is identified. No signs of acute fracture or dislocation. IMPRESSION: 1. No acute findings. 2. Previous rod and screw fixation of the right tibia. 3. Remote healed  fracture deformities of the proximal and mid diaphysis of the tibia and distal shaft of the fibula. Electronically Signed   By: Kimberley Penman M.D.   On: 02/19/2024 12:19   DG Foot 2 Views Right Result Date: 02/19/2024 CLINICAL DATA:  Fall with right foot pain. EXAM: RIGHT FOOT - 2 VIEW COMPARISON:  Right ankle 09/16/2021 FINDINGS: Mild diffuse decreased bone mineralization is present. Bony alignment is normal. No evidence of acute fracture or dislocation. Minimal degenerative changes over the midfoot. Partially visualized hardware over the distal tibia intact and unchanged. IMPRESSION: 1. No acute findings. 2. Minimal degenerative changes over the midfoot. Electronically Signed   By: Roda Cirri M.D.   On: 02/19/2024 12:18    Microbiology: Recent Results (from the past 240 hours)  Urine Culture     Status: Abnormal   Collection Time: 03/12/24  1:44 PM   Specimen: Urine, Clean Catch  Result Value Ref Range Status   Specimen Description   Final    URINE, CLEAN CATCH Performed at [facility], [address], [place], [place] [address]    Special Requests   Final    NONE Performed at [facility], [address] [place], [place] [address]    Culture 60,000 COLONIES/mL ENTEROBACTER CLOACAE (A)  Final   Report Status 03/14/2024 FINAL  Final   Organism ID, Bacteria ENTEROBACTER CLOACAE (A)  Final      Susceptibility   Enterobacter cloacae - MIC*    CEFEPIME  <=0.12 SENSITIVE Sensitive     CIPROFLOXACIN <=0.25 SENSITIVE Sensitive     GENTAMICIN <=1 SENSITIVE Sensitive     IMIPENEM 1 SENSITIVE Sensitive     NITROFURANTOIN 64 INTERMEDIATE Intermediate     TRIMETH/SULFA <=20 SENSITIVE Sensitive     PIP/TAZO <=4 SENSITIVE Sensitive ug/mL    * 60,000 COLONIES/mL ENTEROBACTER CLOACAE     Labs: Basic Metabolic Panel: Recent Labs  Lab 03/12/24 1253 03/13/24 0553 03/14/24 0503 03/15/24 0607  NA 138 143 135 136  K 4.2 4.0 3.6 3.3*  CL 102 107 103  103  CO2 28 26 24  21*  GLUCOSE 79 97 99 102*  BUN 14 11 15 10   CREATININE 0.88 0.87 0.67 0.66  CALCIUM  9.1 8.6* 8.6* 8.9  MG  --   --   --  1.5*  PHOS  --   --   --  2.8   Liver Function Tests: Recent Labs  Lab 03/12/24 1253 03/13/24 0553 03/15/24 0607  AST 14* 14*  --   ALT 11 9  --   ALKPHOS 99 103  --   BILITOT 0.6 0.3  --   PROT 6.9 5.9*  --   ALBUMIN 3.4* 2.8* 2.8*   No results for input(s): LIPASE, AMYLASE in the last 168 hours. Recent Labs  Lab 03/12/24 2048  AMMONIA 48*   CBC: Recent Labs  Lab 03/12/24 1253 03/13/24 0553 03/14/24 0503 03/15/24 0607  WBC 8.6 7.8 9.6 7.1  NEUTROABS 5.5  --   --   --   HGB 11.4* 10.6* 12.3 10.7*  HCT 36.4 33.8* 39.3 34.5*  MCV 92.9 92.6 93.3 91.5  PLT 390 380 387 360   Cardiac Enzymes: No results for input(s): CKTOTAL, CKMB, CKMBINDEX, TROPONINI in the last 168 hours. BNP: BNP (last 3 results) No results for input(s): BNP in the last 8760 hours.  ProBNP (last 3 results) No results for input(s): PROBNP in the last 8760 hours.  CBG: No results for input(s): GLUCAP in the last 168 hours.     Signed:  Hilda Lovings MD.  [facility] 03/15/2024, 11:10 AM

## 2024-03-15 NOTE — TOC Progression Note (Signed)
 Transition of Care [facility]) - Progression Note    Patient Details  Name: Emanuelle Hammerstrom MRN: [id number] Date of Birth: [birth year]  Transition of Care [facility]) CM/SW Contact  Dezra Mandella, Thersia Flax, RN Phone Number: 03/15/2024, 8:45 AM  Clinical Narrative: Received pasrr [id number] E added to fl2.      Expected Discharge Plan: Skilled Nursing Facility Barriers to Discharge: Continued Medical Work up  Expected Discharge Plan and Services   Discharge Planning Services: CM Consult   Living arrangements for the past 2 months: Hotel/Motel                                       Social Determinants of Health (SDOH) Interventions SDOH Screenings   Food Insecurity: Food Insecurity Present (03/12/2024)  Housing: High Risk (03/12/2024)  Transportation Needs: Unmet Transportation Needs (03/12/2024)  Utilities: Not At Risk (03/12/2024)  Social Connections: Moderately Isolated (03/12/2024)  Tobacco Use: Medium Risk (03/12/2024)    Readmission Risk Interventions    03/13/2024   12:02 PM 09/09/2022   12:02 PM 09/08/2022    1:22 PM  Readmission Risk Prevention Plan  Transportation Screening Complete Complete Complete  PCP or Specialist Appt within 5-7 Days  Complete Complete  PCP or Specialist Appt within 3-5 Days Complete    Home Care Screening  Complete Complete  Medication Review (RN CM)  Complete Complete  Social Work Consult for Recovery Care Planning/Counseling Complete    Palliative Care Screening Not Applicable    Medication Review Oceanographer) Complete

## 2024-03-15 NOTE — TOC Progression Note (Signed)
 Transition of Care [facility]) - Progression Note    Patient Details  Name: Lindsey [name] MRN: [id number] Date of Birth: [birth year]  [facility]) CM/SW Contact  Marty Sleet, LCSW Phone Number: 03/15/2024, 10:00 AM  Clinical Narrative:    Met with pt who for bed choice. Pt has accepted bed offer for [facility]. Pt reports having no clothes with her and asked if she would be able to run home to get clothes prior to going to SNF. CSW informed pt that she would have to go directly to SNF from hospital. CSW spoke with pt's daughter, [name], who confirmed bed choice and shares that she will try to have family bring clothes to the hospital for pt. CSW confirmed bed w/ Kia at [facility]. Insurance [employer] has been requested and is currently pending.  CSW reviewed SDOH concerns with pt. She shares that she has an EBT card and receives $350 a month for food. She lives at a hotel with her daughter and has no transportation. She has groceries delivered to hotel. Pt is able to get transportation to her PCP appointments through her primary care office. Additional resources for food, housing, and transportation have bee placed on pt's AVS.    Expected Discharge Plan: Skilled Nursing Facility Barriers to Discharge: Continued Medical Work up  Expected Discharge Plan and Services   Discharge Planning Services: CM Consult   Living arrangements for the past 2 months: Hotel/Motel                                       Social Determinants of Health (SDOH) Interventions SDOH Screenings   Food Insecurity: Food Insecurity Present (03/12/2024)  Housing: High Risk (03/12/2024)  Transportation Needs: Unmet Transportation Needs (03/12/2024)  Utilities: Not At Risk (03/12/2024)  Social Connections: Moderately Isolated (03/12/2024)  Tobacco Use: Medium Risk (03/12/2024)    Readmission Risk Interventions    03/13/2024   12:02 PM 09/09/2022   12:02 PM 09/08/2022    1:22 PM  Readmission  Risk Prevention Plan  Transportation Screening Complete Complete Complete  PCP or Specialist Appt within 5-7 Days  Complete Complete  PCP or Specialist Appt within 3-5 Days Complete    Home Care Screening  Complete Complete  Medication Review (RN CM)  Complete Complete  Social Work Consult for Recovery Care Planning/Counseling Complete    Palliative Care Screening Not Applicable    Medication Review Oceanographer) Complete

## 2024-03-15 NOTE — TOC Transition Note (Signed)
 Transition of Care [facility]) - Discharge Note   Patient Details  Name: Lindsey [name] MRN: [id number] Date of Birth: 1956/08/23  Transition of Care [facility]) CM/SW Contact:  Marty Sleet, LCSW Phone Number: 03/15/2024, 12:47 PM   Clinical Narrative:    Insurance auth approved for SNF. Pt able to transfer to [facility] today. Pt will be going to room 119a. RN to call report to [phone]. DC packet with signed scripts placed at RN station. Pt and daughter agreeable to discharge plans. PTAR called at 2:14pm for transportation.    Final next level of care: Skilled Nursing Facility Barriers to Discharge: Barriers Resolved   Patient Goals and CMS Choice Patient states their goals for this hospitalization and ongoing recovery are:: To go to SNF CMS Medicare.gov Compare Post Acute Care list provided to:: Patient Choice offered to / list presented to : Patient [place] ownership interest in [facility].provided to:: Patient    Discharge Placement PASRR number recieved: 03/15/24            Patient chooses bed at: [facility] Patient to be transferred to facility by: PTAR Name of family member notified: Patient and daughter Lindsey [name] Patient and family notified of of transfer: 03/15/24  Discharge Plan and Services Additional resources added to the After Visit Summary for     Discharge Planning Services: CM Consult            DME Arranged: N/A DME Agency: NA                  Social Drivers of Health (SDOH) Interventions SDOH Screenings   Food Insecurity: Food Insecurity Present (03/12/2024)  Housing: High Risk (03/12/2024)  Transportation Needs: Unmet Transportation Needs (03/12/2024)  Utilities: Not At Risk (03/12/2024)  Social Connections: Moderately Isolated (03/12/2024)  Tobacco Use: Medium Risk (03/12/2024)     Readmission Risk Interventions    03/13/2024   12:02 PM 09/09/2022   12:02 PM 09/08/2022    1:22 PM  Readmission Risk  Prevention Plan  Transportation Screening Complete Complete Complete  PCP or Specialist Appt within 5-7 Days  Complete Complete  PCP or Specialist Appt within 3-5 Days Complete    Home Care Screening  Complete Complete  Medication Review (RN CM)  Complete Complete  Social Work Consult for Recovery Care Planning/Counseling Complete    Palliative Care Screening Not Applicable    Medication Review Oceanographer) Complete

## 2024-03-15 NOTE — Plan of Care (Signed)
  Problem: Pain Managment: Goal: General experience of comfort will improve and/or be controlled Outcome: Progressing   Problem: Safety: Goal: Ability to remain free from injury will improve Outcome: Progressing   Problem: Education: Goal: Expressions of having a comfortable level of knowledge regarding the disease process will increase Outcome: Progressing   Problem: Coping: Goal: Ability to adjust to condition or change in health will improve Outcome: Progressing Goal: Ability to identify appropriate support needs will improve Outcome: Progressing   Problem: Health Behavior/Discharge Planning: Goal: Compliance with prescribed medication regimen will improve Outcome: Progressing   Problem: Clinical Measurements: Goal: Diagnostic test results will improve Outcome: Progressing   Problem: Safety: Goal: Verbalization of understanding the information provided will improve Outcome: Progressing   Problem: Self-Concept: Goal: Level of anxiety will decrease Outcome: Progressing Goal: Ability to verbalize feelings about condition will improve Outcome: Progressing

## 2024-03-15 NOTE — Discharge Instructions (Signed)
 FOOD PANTRY [employer] [facility] [address] [phone]  [facility] [address] [place] [phone]  [facility] [address] [place] [phone]  [facility] [address] [phone]  [facility] [address] [place], [place] [address] [phone]

## 2024-03-16 LAB — LEVETIRACETAM LEVEL: Levetiracetam Lvl: 87.9 ug/mL — ABNORMAL HIGH (ref 10.0–40.0)

## 2024-03-23 NOTE — Progress Notes (Signed)
 INR is part of stroke order set

## 2024-03-26 ENCOUNTER — Encounter: Payer: Self-pay | Admitting: *Deleted

## 2024-03-26 NOTE — Progress Notes (Unsigned)
 Referred for lung mass to pulmonology and oncology. Spoke with Lindsey [name] in pulmonary nodule clinic who said she spoke with daughter and they would like to defer referral at this time due to pt in SNF. Deferred oncology referral pending pulm nodule clinic work up

## 2024-05-11 ENCOUNTER — Emergency Department (HOSPITAL_COMMUNITY)

## 2024-05-11 ENCOUNTER — Other Ambulatory Visit: Payer: Self-pay

## 2024-05-11 ENCOUNTER — Encounter (HOSPITAL_COMMUNITY): Payer: Self-pay

## 2024-05-11 ENCOUNTER — Emergency Department (HOSPITAL_COMMUNITY)
Admission: EM | Admit: 2024-05-11 | Discharge: 2024-05-11 | Disposition: A | Discharge status: Home / Self Care | Attending: Emergency Medicine | Admitting: Emergency Medicine

## 2024-05-11 DIAGNOSIS — J449 Chronic obstructive pulmonary disease, unspecified: Secondary | ICD-10-CM | POA: Diagnosis not present

## 2024-05-11 DIAGNOSIS — W19XXXA Unspecified fall, initial encounter: Secondary | ICD-10-CM | POA: Insufficient documentation

## 2024-05-11 DIAGNOSIS — R600 Localized edema: Secondary | ICD-10-CM | POA: Diagnosis not present

## 2024-05-11 DIAGNOSIS — I10 Essential (primary) hypertension: Secondary | ICD-10-CM | POA: Insufficient documentation

## 2024-05-11 DIAGNOSIS — R0602 Shortness of breath: Secondary | ICD-10-CM | POA: Diagnosis present

## 2024-05-11 DIAGNOSIS — R519 Headache, unspecified: Secondary | ICD-10-CM | POA: Diagnosis not present

## 2024-05-11 LAB — CBC WITH DIFFERENTIAL/PLATELET
Abs Immature Granulocytes: 0.02 K/uL (ref 0.00–0.07)
Basophils Absolute: 0 K/uL (ref 0.0–0.1)
Basophils Relative: 0 %
Eosinophils Absolute: 0.1 K/uL (ref 0.0–0.5)
Eosinophils Relative: 2 %
HCT: 38.2 % (ref 36.0–46.0)
Hemoglobin: 12 g/dL (ref 12.0–15.0)
Immature Granulocytes: 0 %
Lymphocytes Relative: 17 %
Lymphs Abs: 1.3 K/uL (ref 0.7–4.0)
MCH: 29 pg (ref 26.0–34.0)
MCHC: 31.4 g/dL (ref 30.0–36.0)
MCV: 92.3 fL (ref 80.0–100.0)
Monocytes Absolute: 0.6 K/uL (ref 0.1–1.0)
Monocytes Relative: 7 %
Neutro Abs: 5.7 K/uL (ref 1.7–7.7)
Neutrophils Relative %: 74 %
Platelets: 356 K/uL (ref 150–400)
RBC: 4.14 MIL/uL (ref 3.87–5.11)
RDW: 14.3 % (ref 11.5–15.5)
Smear Review: NORMAL
WBC: 7.7 K/uL (ref 4.0–10.5)
nRBC: 0 % (ref 0.0–0.2)

## 2024-05-11 LAB — COMPREHENSIVE METABOLIC PANEL WITH GFR
ALT: 8 U/L (ref 0–44)
AST: 22 U/L (ref 15–41)
Albumin: 3.4 g/dL — ABNORMAL LOW (ref 3.5–5.0)
Alkaline Phosphatase: 97 U/L (ref 38–126)
Anion gap: 11 (ref 5–15)
BUN: 11 mg/dL (ref 8–23)
CO2: 22 mmol/L (ref 22–32)
Calcium: 9.3 mg/dL (ref 8.9–10.3)
Chloride: 109 mmol/L (ref 98–111)
Creatinine, Ser: 0.6 mg/dL (ref 0.44–1.00)
GFR, Estimated: >60 mL/min (ref >60)
Glucose, Bld: 80 mg/dL (ref 70–99)
Potassium: 5.3 mmol/L — ABNORMAL HIGH (ref 3.5–5.1)
Sodium: 142 mmol/L (ref 135–145)
Total Bilirubin: 0.7 mg/dL (ref 0.0–1.2)
Total Protein: 7.2 g/dL (ref 6.5–8.1)

## 2024-05-11 LAB — TROPONIN I (HIGH SENSITIVITY)
Troponin I (High Sensitivity): 12 ng/L (ref <18)
Troponin I (High Sensitivity): 12 ng/L (ref <18)

## 2024-05-11 LAB — BRAIN NATRIURETIC PEPTIDE: B Natriuretic Peptide: 66.9 pg/mL (ref 0.0–100.0)

## 2024-05-11 NOTE — ED Triage Notes (Addendum)
 [facility]- states she hit her head, denies blood thinners. Endorses SOB and leg swelling. Hx of COPD. Denies chest pain. Patient is poor historian and requesting her daughter be called for hx.

## 2024-05-11 NOTE — ED Notes (Signed)
 Ptar called pt is up next for pick up

## 2024-05-11 NOTE — ED Provider Notes (Signed)
 [place] EMERGENCY DEPARTMENT AT [facility] Provider Note  MDM   HPI/ROS:  Lindsey [name] is a 68 y.o. female with a medical history as below who presents with progressive bilateral lower extremity swelling, shortness of breath has been going on for some time.  She also endorses 2 mechanical falls this morning due to difficulty getting out of bed secondary to weakness.  She is endorsing some pain in the back of her head and neck that is chronic but worsened after her fall.  She denies any recent episodes of chest pain, nausea or vomiting, fevers, cough or other sick symptoms.  She does endorse some orthopnea.  Physical exam is notable for: - 1+ pitting edema bilaterally.  Diminished lung sounds bilateral bases.  No significant increased work of breathing.  Currently on 2 L nasal cannula for comfort.    On my initial evaluation, patient is:  -Vital signs stable. Patient afebrile, hemodynamically stable, and non-toxic appearing. -Additional history obtained from EMS   Differentials include CHF, ACS, pneumonia, PE, dissection, pneumothorax, COPD, asthma, virus, DVT.    On physical exam patient has no significant increased work of breathing.  She is on 2 L nasal cannula but saturating 100%.  Placed by EMS for comfort.  I do not appreciate any wheezing.  will wean and reassess respiratory status.  No reported desaturations in the field.  She does have 1+ pitting edema to bilateral lower extremities.  Given her presentation and falls do believe imaging and labs are reasonable.  Results of these are as below and are reassuring.  She is no supplemental O2 requirement.  Continues to breathe without increased work.  Do not believe she requires a further emergent workup or intervention so she was discharged in stable condition with instructions to follow-up with her PCP and strict return precautions. Of note I did not attempt to call family to gain collateral information however unable to reach  them.  Did attempt to leave a message however unable.  Interpretations, interventions, and the patient's course of care are documented below.    Clinical Course as of 05/11/24 2050  Fri May 11, 2024  1514 EKG 12-Lead NSR with normal axis and intervals.  No STE or depression.  Occasional PVC. [RC]  1747 Potassium(!): 5.3 Likely hemolysis no EKG changes [RC]  1747 CBC without leukocytosis or anemia [RC]  1747 Troponin I (High Sensitivity): 12 [RC]  1747 B Natriuretic Peptide: 66.9 [RC]  1747 CT Head Wo Contrast No acute injury [RC]  1747 CT Cervical Spine Wo Contrast No acute injury [RC]  1747 DG Chest Portable 1 View No pneumonia, pulmonary edema or other acute intrathoracic pathology [RC]  2049 Troponin I (High Sensitivity): 12 [RC]    Clinical Course User Index [RC] Sharyne Darina RAMAN, MD      Disposition:  I discussed the plan for discharge with the patient and/or their surrogate at bedside prior to discharge and they were in agreement with the plan and verbalized understanding of the return precautions provided. All questions answered to the best of my ability. Ultimately, the patient was discharged in stable condition with stable vital signs. I am reassured that they are capable of close follow up and good social support at home.   Clinical Impression:  1. Shortness of breath   2. Fall, initial encounter   3. Bilateral lower extremity edema      The plan for this patient was discussed with Dr. Dean, who voiced agreement and who oversaw evaluation  and treatment of this patient.   Clinical Complexity A medically appropriate history, review of systems, and physical exam was performed.  My independent interpretations of EKG, labs, and radiology are documented in the ED course above.   If decision rules were used in this patient's evaluation, they are listed below.   Click here for ABCD2, HEART and other calculatorsREFRESH Note before signing   Patient's presentation  is most consistent with acute presentation with potential threat to life or bodily function.  Medical Decision Making Amount and/or Complexity of Data Reviewed Labs: ordered. Decision-making details documented in ED Course. Radiology: ordered. Decision-making details documented in ED Course. ECG/medicine tests:  Decision-making details documented in ED Course.    HPI/ROS      See MDM section for pertinent HPI and ROS. A complete ROS was performed with pertinent positives/negatives noted above.   Past Medical History:  Diagnosis Date   Arthritis    Asthma    Drug abuse (HCC)    recovered   GERD (gastroesophageal reflux disease)    H/O ETOH abuse    HTN (hypertension)    Seizure (HCC)     Past Surgical History:  Procedure Laterality Date   ABDOMINAL HYSTERECTOMY     BIOPSY  09/14/2022   Procedure: BIOPSY;  Surgeon: Aneita Gwendlyn DASEN, MD;  Location: [facility];  Service: Gastroenterology;;   COLONOSCOPY WITH PROPOFOL  N/A 03/18/2022   Procedure: COLONOSCOPY WITH PROPOFOL ;  Surgeon: Teressa Toribio SQUIBB, MD;  Location: [facility];  Service: Gastroenterology;  Laterality: N/A;   ESOPHAGOGASTRODUODENOSCOPY (EGD) WITH PROPOFOL  N/A 09/14/2022   Procedure: ESOPHAGOGASTRODUODENOSCOPY (EGD) WITH PROPOFOL ;  Surgeon: Aneita Gwendlyn DASEN, MD;  Location: [facility];  Service: Gastroenterology;  Laterality: N/A;   GALLBLADDER SURGERY     peptic ulcer surgery     POLYPECTOMY  03/18/2022   Procedure: POLYPECTOMY;  Surgeon: Teressa Toribio SQUIBB, MD;  Location: [facility];  Service: Gastroenterology;;   HARLEY DILATION N/A 09/14/2022   Procedure: HARLEY DILATION;  Surgeon: Aneita Gwendlyn DASEN, MD;  Location: [facility];  Service: Gastroenterology;  Laterality: N/A;      Physical Exam   Vitals:   05/11/24 1504 05/11/24 1534  BP: (!) 152/88   Pulse: 93   Resp: 20   Temp: 98 F (36.7 C)   TempSrc: Oral   SpO2: 97% 99%  Weight: 83.9 kg   Height: 5' 8 (1.727 m)     Physical Exam Vitals and  nursing note reviewed.  Constitutional:      General: She is not in acute distress.    Appearance: She is well-developed. She is not toxic-appearing.  HENT:     Head: Normocephalic and atraumatic.  Eyes:     Conjunctiva/sclera: Conjunctivae normal.  Cardiovascular:     Rate and Rhythm: Normal rate and regular rhythm.     Heart sounds: No murmur heard. Pulmonary:     Effort: Pulmonary effort is normal. No respiratory distress.     Breath sounds: Normal breath sounds. No wheezing.     Comments: Diminished lung sounds bilateral bases Abdominal:     Palpations: Abdomen is soft.     Tenderness: There is no abdominal tenderness.  Musculoskeletal:        General: No swelling.     Cervical back: Neck supple.     Right lower leg: 1+ Edema present.     Left lower leg: 1+ Edema present.  Skin:    General: Skin is warm and dry.     Capillary Refill: Capillary refill  takes less than 2 seconds.  Neurological:     Mental Status: She is alert.  Psychiatric:        Mood and Affect: Mood normal.      Procedures   If procedures were preformed on this patient, they are listed below:  Procedures   @BBSIG @   Please note that this documentation was produced with the assistance of voice-to-text technology and may contain errors.    Sharyne Darina RAMAN, MD 05/11/24 7945    Dean Clarity, MD 05/11/24 2325

## 2024-05-11 NOTE — ED Notes (Signed)
 Phlebotomy requested for second trop after two failed attempts

## 2024-05-11 NOTE — ED Notes (Signed)
 Pt's brief changed.

## 2024-05-11 NOTE — Discharge Instructions (Signed)
 You were seen today for shortness of breath and falls. While you were here we monitored your vitals, preformed a physical exam, and labs and CT scan. These were all reassuring and there is no indication for any further testing or intervention in the emergency department at this time.   Things to do:  - Follow up with your primary care provider within the next 1-2 weeks  Return to the emergency department if you have any new or worsening symptoms including worsening shortness of breath, chest pain, or if you have any other concerns.

## 2024-05-11 NOTE — ED Notes (Signed)
 Pt daughter Thurnell called and updated per pt request, informed daughter pt is ready for dc and PTAR will be called for transport to return home.

## 2024-05-16 ENCOUNTER — Emergency Department (HOSPITAL_COMMUNITY)
Admission: EM | Admit: 2024-05-16 | Discharge: 2024-05-18 | Disposition: A | Discharge status: Skilled Nursing Facility | Attending: Emergency Medicine | Admitting: Emergency Medicine

## 2024-05-16 ENCOUNTER — Other Ambulatory Visit: Payer: Self-pay

## 2024-05-16 DIAGNOSIS — Z79899 Other long term (current) drug therapy: Secondary | ICD-10-CM | POA: Insufficient documentation

## 2024-05-16 DIAGNOSIS — G40919 Epilepsy, unspecified, intractable, without status epilepticus: Secondary | ICD-10-CM

## 2024-05-16 DIAGNOSIS — G309 Alzheimer's disease, unspecified: Secondary | ICD-10-CM | POA: Diagnosis not present

## 2024-05-16 DIAGNOSIS — F0280 Dementia in other diseases classified elsewhere without behavioral disturbance: Secondary | ICD-10-CM | POA: Diagnosis not present

## 2024-05-16 DIAGNOSIS — G4089 Other seizures: Secondary | ICD-10-CM | POA: Insufficient documentation

## 2024-05-16 DIAGNOSIS — I10 Essential (primary) hypertension: Secondary | ICD-10-CM | POA: Insufficient documentation

## 2024-05-16 DIAGNOSIS — R569 Unspecified convulsions: Secondary | ICD-10-CM | POA: Diagnosis present

## 2024-05-16 DIAGNOSIS — J449 Chronic obstructive pulmonary disease, unspecified: Secondary | ICD-10-CM | POA: Insufficient documentation

## 2024-05-16 LAB — DIFFERENTIAL
Abs Immature Granulocytes: 0.01 K/uL (ref 0.00–0.07)
Basophils Absolute: 0 K/uL (ref 0.0–0.1)
Basophils Relative: 0 %
Eosinophils Absolute: 0.1 K/uL (ref 0.0–0.5)
Eosinophils Relative: 1 %
Immature Granulocytes: 0 %
Lymphocytes Relative: 23 %
Lymphs Abs: 2 K/uL (ref 0.7–4.0)
Monocytes Absolute: 1 K/uL (ref 0.1–1.0)
Monocytes Relative: 11 %
Neutro Abs: 5.9 K/uL (ref 1.7–7.7)
Neutrophils Relative %: 65 %

## 2024-05-16 LAB — CBG MONITORING, ED: Glucose-Capillary: 75 mg/dL (ref 70–99)

## 2024-05-16 LAB — COMPREHENSIVE METABOLIC PANEL WITH GFR
ALT: 5 U/L (ref 0–44)
AST: 27 U/L (ref 15–41)
Albumin: 3.8 g/dL (ref 3.5–5.0)
Alkaline Phosphatase: 135 U/L — ABNORMAL HIGH (ref 38–126)
Anion gap: 12 (ref 5–15)
BUN: 21 mg/dL (ref 8–23)
CO2: 28 mmol/L (ref 22–32)
Calcium: 9.5 mg/dL (ref 8.9–10.3)
Chloride: 99 mmol/L (ref 98–111)
Creatinine, Ser: 0.81 mg/dL (ref 0.44–1.00)
GFR, Estimated: >60 mL/min (ref >60)
Glucose, Bld: 81 mg/dL (ref 70–99)
Potassium: 4.6 mmol/L (ref 3.5–5.1)
Sodium: 139 mmol/L (ref 135–145)
Total Bilirubin: 0.3 mg/dL (ref 0.0–1.2)
Total Protein: 8.7 g/dL — ABNORMAL HIGH (ref 6.5–8.1)

## 2024-05-16 LAB — CBC
HCT: 38.2 % (ref 36.0–46.0)
Hemoglobin: 11.6 g/dL — ABNORMAL LOW (ref 12.0–15.0)
MCH: 28.2 pg (ref 26.0–34.0)
MCHC: 30.4 g/dL (ref 30.0–36.0)
MCV: 92.9 fL (ref 80.0–100.0)
Platelets: 382 K/uL (ref 150–400)
RBC: 4.11 MIL/uL (ref 3.87–5.11)
RDW: 14.1 % (ref 11.5–15.5)
WBC: 9 K/uL (ref 4.0–10.5)
nRBC: 0 % (ref 0.0–0.2)

## 2024-05-16 LAB — MAGNESIUM: Magnesium: 1.4 mg/dL — ABNORMAL LOW (ref 1.7–2.4)

## 2024-05-16 MED ORDER — LEVETIRACETAM 500 MG PO TABS
1000.0000 mg | ORAL_TABLET | Freq: Once | ORAL | Status: AC
  Administered 2024-05-16: 1000 mg via ORAL
  Filled 2024-05-16: qty 2

## 2024-05-16 MED ORDER — MAGNESIUM OXIDE -MG SUPPLEMENT 400 (240 MG) MG PO TABS
200.0000 mg | ORAL_TABLET | Freq: Every day | ORAL | Status: DC
  Administered 2024-05-16 – 2024-05-18 (×4): 200 mg via ORAL
  Filled 2024-05-16 (×3): qty 1

## 2024-05-16 MED ORDER — LEVETIRACETAM (KEPPRA) 500 MG/5 ML ADULT IV PUSH: 1000.0000 mg | Freq: Once | INTRAVENOUS | Status: DC

## 2024-05-16 MED ORDER — DIVALPROEX SODIUM ER 500 MG PO TB24
1000.0000 mg | ORAL_TABLET | ORAL | Status: AC
  Administered 2024-05-16: 1000 mg via ORAL
  Filled 2024-05-16: qty 2

## 2024-05-16 NOTE — ED Triage Notes (Addendum)
 BIB [facility], at [facility] yesterday for seizures, hx of seizures. In the lobby waiting to go home from [facility] but had a period of unresponsiveness in the lobby. Alert on EMS arrival, did not report any seizure activity to EMS. Somnolent at baseline. HR80 BP160/90 CBG106 o2-98% RA.  Per daughter, pt had multiple falls on Friday.

## 2024-05-16 NOTE — ED Notes (Addendum)
 Brother Ozell [phone] would like updates

## 2024-05-16 NOTE — Discharge Instructions (Addendum)
 You were seen in the emergency room for seizure.  We recommend that you follow-up with your doctor for seizure management.

## 2024-05-16 NOTE — ED Provider Notes (Signed)
 [place] EMERGENCY DEPARTMENT AT [facility] Provider Note   CSN: [id number] Arrival date & time: 05/16/24  1121     Patient presents with: Loss of Consciousness   Lindsey [name] is a 68 y.o. female.  {Add pertinent medical, surgical, social history, OB history to HPI:32947} HPI     68 year old female comes in with chief complaint of seizure. Patient has past medical history of seizure disorder.  Patient was at her PCP clinic, and she had a 5-minute episode of seizure.  Collateral history provided by Patient's daughter.  I called patient's daughter, who states that patient was feeling fine when she went to the appointment today.  She had just spoken with the care team at the clinic.  5 minutes later, she received a call that patient went unresponsive and started having seizure-like activity.  Patient has history of seizures, hypertension, COPD. Per daughter, patient used to be on Depakote  and Keppra , however she was admitted to a rehab facility.  While there, Keppra  was increased to 1 g but Depakote  was discontinued.  This change occurred in July.  Patient's last seizure was also in July.  Prior to that, she had not had seizures in several months.  Patient has no recent illnesses, nausea, vomiting and she is compliant with her medications.  Prior to Admission medications   Medication Sig Start Date End Date Taking? Authorizing Provider  albuterol  (VENTOLIN  HFA) 108 (90 Base) MCG/ACT inhaler Inhale 2 puffs into the lungs every 4 (four) hours as needed for wheezing or shortness of breath.    [provider]  diclofenac  Sodium (VOLTAREN ) 1 % GEL Apply 2 g topically 4 (four) times daily. Apply to knees/thighs 03/15/24   Sebastian Toribio GAILS, MD  divalproex  (DEPAKOTE  ER) 250 MG 24 hr tablet Take 3 tablets (750 mg total) by mouth 2 (two) times daily. 03/15/24   Sebastian Toribio GAILS, MD  Ferrous Sulfate (IRON PO) Take 1 tablet by mouth daily.    [provider]   fluticasone  furoate-vilanterol (BREO ELLIPTA ) 100-25 MCG/ACT AEPB Inhale 1 puff into the lungs daily.    [provider]  gabapentin  (NEURONTIN ) 100 MG capsule Take 100 mg by mouth 3 (three) times daily.    [provider]  lactulose  (CHRONULAC ) 10 GM/15ML solution Take 15 mLs (10 g total) by mouth 2 (two) times daily. 03/15/24   Sebastian Toribio GAILS, MD  levETIRAcetam  (KEPPRA ) 750 MG tablet Take 2 tablets (1,500 mg total) by mouth 2 (two) times daily. 03/15/24   Sebastian Toribio GAILS, MD  loratadine  (CLARITIN ) 10 MG tablet Take 1 tablet (10 mg total) by mouth at bedtime. 03/15/24   Sebastian Toribio GAILS, MD  mirtazapine  (REMERON ) 7.5 MG tablet Take 7.5 mg by mouth at bedtime.    [provider]  oxycodone  (OXY-IR) 5 MG capsule Take 1 capsule (5 mg total) by mouth every 4 (four) hours as needed. 03/15/24   Sebastian Toribio GAILS, MD  pantoprazole  (PROTONIX ) 40 MG tablet Take 1 tablet (40 mg total) by mouth daily. 03/15/24   Sebastian Toribio GAILS, MD  TYLENOL  8 HOUR ARTHRITIS PAIN 650 MG CR tablet Take 1,300 mg by mouth daily as needed for pain.    [provider]    Allergies: Iodinated contrast media, Oxycodone , Ibuprofen , Kiwi extract, Morphine  and codeine , Penicillins, and Strawberry extract    Review of Systems  All other systems reviewed and are negative.   Updated Vital Signs BP (!) 146/86 (BP Location: Right Arm)   Pulse 76  Temp (!) 97.5 F (36.4 C) (Oral)   Resp 16   SpO2 97%   Physical Exam Vitals and nursing note reviewed.  Constitutional:      Appearance: She is well-developed.  HENT:     Head: Atraumatic.  Cardiovascular:     Rate and Rhythm: Normal rate.  Pulmonary:     Effort: Pulmonary effort is normal.  Musculoskeletal:     Cervical back: Normal range of motion and neck supple.  Skin:    General: Skin is warm and dry.  Neurological:     Mental Status: She is alert and oriented to person, place, and time.     (all labs ordered are listed,  but only abnormal results are displayed) Labs Reviewed  COMPREHENSIVE METABOLIC PANEL WITH GFR  CBC WITH DIFFERENTIAL/PLATELET  MAGNESIUM   CBG MONITORING, ED    EKG: EKG Interpretation Date/Time:  Wednesday May 16 2024 13:55:07 EDT Ventricular Rate:  90 PR Interval:  157 QRS Duration:  90 QT Interval:  342 QTC Calculation: 419 R Axis:   19  Text Interpretation: Sinus rhythm Ventricular premature complex Borderline T wave abnormalities No acute changes No significant change since last tracing Confirmed by Charlyn Sora [phone]) on 05/16/2024 2:37:23 PM  Radiology: No results found.  {Document cardiac monitor, telemetry assessment procedure when appropriate:32947} Procedures   Medications Ordered in the ED  levETIRAcetam  (KEPPRA ) tablet 1,000 mg (1,000 mg Oral Given 05/16/24 1448)      {Click here for ABCD2, HEART and other calculators REFRESH Note before signing:1}                              Medical Decision Making Amount and/or Complexity of Data Reviewed Labs: ordered.  Risk Prescription drug management.   68 year old patient comes in with chief complaint of seizures. Patient has history of seizure disorder, but it appears that there has been changes to the medications.  Differential diagnosis includes: -Seizure disorder -Meningitis -Trauma -ICH -Electrolyte abnormality -Metabolic derangement -Stroke -Toxin induced seizures -Medication side effects -Hypoxia -Hypoglycemia   Plan is to get basic labs.  Reassessment: Nursing staff had difficulty with IV access.  Patient just had labs a few days ago, which were reassuring.  Potassium was slightly high.  We will get labs still.  But, I do not think IV Keppra  is necessarily indicated.  Patient has been in the ER for over 3 hours without seizures.  We will give her oral Keppra .  Anticipate discharge.   Final diagnoses:  Breakthrough seizure [facility])    ED Discharge Orders     None

## 2024-05-16 NOTE — ED Provider Notes (Signed)
 3:50 PM Patient signed out to me by previous ED physician. Patient is a 68 yo female with pmh of seizures presenting for seizure with her pcp. Was previously on Depakote . Pcp took her off and increased Keppra  dose.   Physical Exam  BP (!) 146/86 (BP Location: Right Arm)   Pulse 76   Temp (!) 97.5 F (36.4 C) (Oral)   Resp (!) 24   SpO2 97%   Physical Exam  Procedures  Procedures  ED Course / MDM    Medical Decision Making Amount and/or Complexity of Data Reviewed Labs: ordered.  Risk OTC drugs. Prescription drug management.   I spoke with the patient's daughter Thurnell states that the patient has had continued cognitive decline, multiple environmental changes that has worsened her dementia including currently living in a hotel with her daughter.  She states she does not have eyes on her 24/7 and is unable to stay at the house to even help her get to the bathroom at times.  It looks like on patient's discharge summary the patient was recommended to continue Depakote  and Keppra  however the patient's daughter states that her primary care physician has taken her off of Depakote  due to possibly worsening cognitive function.  Patient has only been taking Depakote  at this time.  At hospital discharge she was so recommended for follow-up in 2 weeks with [facility] neurology and has not done so.  I spoke with our on-call neurologist Dr. Michaela who recommends restarting the Depakote  1000 mg now and then 750 mg twice daily outpatient in addition to continuing Keppra  as she was previously recommended.  In addition to this the patient's daughter states that she is unsafe to be taken home at this time due to continued cognitive decline, and inability to stay with the patient 24/7. And currently staying at hotel.  I assume all of this is adding to the patient's worsening dementia status.  Order for PT and OT evaluation has been placed.  TOC order placed.  Family is requesting rehabilitation. I am  not sure at this point what we are rehabilitating or if possibly a memory care unit would be better.  Patient signed out to oncoming provider at shift change.       Elnor Hila P, DO 05/16/24 1735

## 2024-05-16 NOTE — ED Notes (Signed)
 Patient moved to room 29. Walker in [place] with patient label on it.

## 2024-05-17 ENCOUNTER — Ambulatory Visit (HOSPITAL_BASED_OUTPATIENT_CLINIC_OR_DEPARTMENT_OTHER): Admitting: Pulmonary Disease

## 2024-05-17 DIAGNOSIS — G4089 Other seizures: Secondary | ICD-10-CM | POA: Diagnosis not present

## 2024-05-17 LAB — URINALYSIS, ROUTINE W REFLEX MICROSCOPIC
Bacteria, UA: NONE SEEN
Bilirubin Urine: NEGATIVE
Glucose, UA: NEGATIVE mg/dL
Hgb urine dipstick: NEGATIVE
Ketones, ur: NEGATIVE mg/dL
Nitrite: NEGATIVE
Protein, ur: NEGATIVE mg/dL
Specific Gravity, Urine: 1.029 (ref 1.005–1.030)
pH: 6 (ref 5.0–8.0)

## 2024-05-17 MED ORDER — GABAPENTIN 100 MG PO CAPS
100.0000 mg | ORAL_CAPSULE | Freq: Three times a day (TID) | ORAL | Status: DC
  Administered 2024-05-17 – 2024-05-18 (×3): 100 mg via ORAL
  Filled 2024-05-17 (×3): qty 1

## 2024-05-17 MED ORDER — LORATADINE 10 MG PO TABS
10.0000 mg | ORAL_TABLET | Freq: Every day | ORAL | Status: DC
  Administered 2024-05-17: 10 mg via ORAL
  Filled 2024-05-17: qty 1

## 2024-05-17 MED ORDER — ACETAMINOPHEN 500 MG PO TABS: 1000.0000 mg | ORAL_TABLET | Freq: Every day | ORAL | Status: DC | PRN

## 2024-05-17 MED ORDER — DIVALPROEX SODIUM ER 500 MG PO TB24
750.0000 mg | ORAL_TABLET | Freq: Two times a day (BID) | ORAL | Status: DC
  Administered 2024-05-17 – 2024-05-18 (×3): 750 mg via ORAL
  Filled 2024-05-17 (×3): qty 1

## 2024-05-17 MED ORDER — MIRTAZAPINE 7.5 MG PO TABS
7.5000 mg | ORAL_TABLET | Freq: Every day | ORAL | Status: DC
  Administered 2024-05-17: 7.5 mg via ORAL
  Filled 2024-05-17: qty 1

## 2024-05-17 MED ORDER — ALBUTEROL SULFATE HFA 108 (90 BASE) MCG/ACT IN AERS: 2.0000 | INHALATION_SPRAY | RESPIRATORY_TRACT | Status: DC | PRN

## 2024-05-17 MED ORDER — DICLOFENAC SODIUM 1 % EX GEL
2.0000 g | Freq: Four times a day (QID) | CUTANEOUS | Status: DC
  Administered 2024-05-17 – 2024-05-18 (×5): 2 g via TOPICAL
  Filled 2024-05-17: qty 100

## 2024-05-17 MED ORDER — PANTOPRAZOLE SODIUM 40 MG PO TBEC
40.0000 mg | DELAYED_RELEASE_TABLET | Freq: Every day | ORAL | Status: DC
  Administered 2024-05-17 – 2024-05-18 (×2): 40 mg via ORAL
  Filled 2024-05-17 (×2): qty 1

## 2024-05-17 MED ORDER — LEVETIRACETAM 500 MG PO TABS
1500.0000 mg | ORAL_TABLET | Freq: Two times a day (BID) | ORAL | Status: DC
  Administered 2024-05-17 – 2024-05-18 (×3): 1500 mg via ORAL
  Filled 2024-05-17 (×3): qty 3

## 2024-05-17 NOTE — ED Notes (Signed)
 Patient resting well in bed at this time

## 2024-05-17 NOTE — Progress Notes (Signed)
 CSW spoke with pt's daughter who stated she and the pt have been staying in a motel for 6 months. She states they currently reside at [facility]. Daughter reports pt has not used alcohol since 2019.   Daughter verbalized understanding of barrier to placement is possibly that pt is currently experiencing homelessness. Awaiting PT eval to evaluate pt's performance. Per daughter, pt has access to their room and knows how to get inside.
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
## 2024-05-17 NOTE — Progress Notes (Addendum)
 Info uploaded to [employer]. PASRR pending.   Pt accepted [facility]. [facility] notified. Auth pending.

## 2024-05-17 NOTE — ED Provider Notes (Signed)
 Emergency Medicine Observation Re-evaluation Note  Lindsey [name] is a 68 y.o. female, seen on rounds today.  Pt initially presented to the ED for complaints of Loss of Consciousness Currently, the patient is resting comfortably. She is awaiting physical therapy evaluation.  Patient came yesterday to the ER because of seizure.  She had breakthrough episode.  It appears that her medications have been adjusted recently.  Patient currently resides at a motel.  This is not new.  She has been in the motel for over 6 months.  Daughter wanted patient to be evaluated for placement given some cognitive decline and to see if she would be eligible for placement.  Physical Exam  BP (!) 144/97 (BP Location: Right Arm)   Pulse 77   Temp (!) 97.5 F (36.4 C)   Resp 18   SpO2 99%  Physical Exam General: No acute distress   ED Course / MDM  EKG:EKG Interpretation Date/Time:  Wednesday May 16 2024 13:55:07 EDT Ventricular Rate:  90 PR Interval:  157 QRS Duration:  90 QT Interval:  342 QTC Calculation: 419 R Axis:   19  Text Interpretation: Sinus rhythm Ventricular premature complex Borderline T wave abnormalities No acute changes No significant change since last tracing Confirmed by Charlyn Sora [phone]) on 05/16/2024 2:37:23 PM  I have reviewed the labs performed to date as well as medications administered while in observation.  Recent changes in the last 24 hours include -no new changes.  Awaiting PT evaluation.  Comprehensive social work evaluation to be completed thereafter..  Plan  Current plan is for holding patient for social work evaluation.    Charlyn Sora, MD 05/17/24 [phone]
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
## 2024-05-17 NOTE — Progress Notes (Addendum)
 30 Day PASRR Note   Patient Details  Name: Lindsey [name] Date of Birth: 11-Sep-1956   Transition of Care [facility]) CM/SW Contact:    Kari JONETTA Daisy, LCSW Phone Number: 05/17/2024, 11:49 AM  To Whom It May Concern:  Please be advised that this patient will require a short-term nursing home stay - anticipated 30 days or less for rehabilitation and strengthening.   The plan is for return home.

## 2024-05-18 DIAGNOSIS — G4089 Other seizures: Secondary | ICD-10-CM | POA: Diagnosis not present

## 2024-05-18 NOTE — ED Notes (Signed)
 Patient off unit to facility per provider. Patient alert, calm, cooperative, confused, no s/s of distress at time of discharge. Discharge information and belonging a given to patient. Patient off unit in w/c. Patient transported by transport service.

## 2024-05-18 NOTE — Progress Notes (Addendum)
 Lindsey [name] is pending.   Addend @ 11:33AM Auth approved. EDP and RN notified via secure chat. Kia notified. CSW left HIPAA Compliant voicemail for daughter. [facility]AR to transport.   PASRR: [id number]  Addend @ 1:37PM CSW left daughter a voicemail to request she pick up wheelchair. If she is unable, will see if [facility] can pick it up and deliver it to motel room.  Addend @ 2:49PM Engineer, technical sales will take pt in her wheelchair to [facility]. Kia notified.

## 2024-05-18 NOTE — ED Provider Notes (Signed)
 Emergency Medicine Observation Re-evaluation Note  Lindsey [name] is a 68 y.o. female, seen on rounds today.  Pt initially presented to the ED for complaints of Loss of Consciousness Currently, the patient is sitting in bed.  No complaints.  Physical Exam  BP (!) 141/69 (BP Location: Right Arm)   Pulse 72   Temp 97.7 F (36.5 C) (Oral)   Resp 14   SpO2 94%  Physical Exam   ED Course / MDM  EKG:EKG Interpretation Date/Time:  Wednesday May 16 2024 13:55:07 EDT Ventricular Rate:  90 PR Interval:  157 QRS Duration:  90 QT Interval:  342 QTC Calculation: 419 R Axis:   19  Text Interpretation: Sinus rhythm Ventricular premature complex Borderline T wave abnormalities No acute changes No significant change since last tracing Confirmed by Charlyn Sora [phone]) on 05/16/2024 2:37:23 PM  I have reviewed the labs performed to date as well as medications administered while in observation.  Recent changes in the last 24 hours include none.  Plan  Current plan is for awaiting authorization for placement.    Lindsey Faden, MD 05/18/24 [phone]

## 2024-06-04 ENCOUNTER — Emergency Department (HOSPITAL_COMMUNITY)

## 2024-06-04 ENCOUNTER — Inpatient Hospital Stay (HOSPITAL_COMMUNITY)
Admission: EM | Admit: 2024-06-04 | Discharge: 2024-06-06 | DRG: 091 | Disposition: A | Discharge status: Skilled Nursing Facility | Attending: Internal Medicine | Admitting: Internal Medicine

## 2024-06-04 ENCOUNTER — Other Ambulatory Visit: Payer: Self-pay

## 2024-06-04 ENCOUNTER — Encounter (HOSPITAL_COMMUNITY): Payer: Self-pay

## 2024-06-04 DIAGNOSIS — Z635 Disruption of family by separation and divorce: Secondary | ICD-10-CM

## 2024-06-04 DIAGNOSIS — Z87891 Personal history of nicotine dependence: Secondary | ICD-10-CM

## 2024-06-04 DIAGNOSIS — F439 Reaction to severe stress, unspecified: Secondary | ICD-10-CM | POA: Diagnosis present

## 2024-06-04 DIAGNOSIS — Z5982 Transportation insecurity: Secondary | ICD-10-CM

## 2024-06-04 DIAGNOSIS — G309 Alzheimer's disease, unspecified: Secondary | ICD-10-CM | POA: Diagnosis present

## 2024-06-04 DIAGNOSIS — Z91018 Allergy to other foods: Secondary | ICD-10-CM

## 2024-06-04 DIAGNOSIS — Z811 Family history of alcohol abuse and dependence: Secondary | ICD-10-CM

## 2024-06-04 DIAGNOSIS — D72829 Elevated white blood cell count, unspecified: Secondary | ICD-10-CM | POA: Diagnosis present

## 2024-06-04 DIAGNOSIS — Z91148 Patient's other noncompliance with medication regimen for other reason: Secondary | ICD-10-CM

## 2024-06-04 DIAGNOSIS — F1010 Alcohol abuse, uncomplicated: Secondary | ICD-10-CM | POA: Diagnosis present

## 2024-06-04 DIAGNOSIS — R000 Tachycardia, unspecified: Secondary | ICD-10-CM | POA: Diagnosis present

## 2024-06-04 DIAGNOSIS — E785 Hyperlipidemia, unspecified: Secondary | ICD-10-CM | POA: Diagnosis present

## 2024-06-04 DIAGNOSIS — Z79899 Other long term (current) drug therapy: Secondary | ICD-10-CM

## 2024-06-04 DIAGNOSIS — Z800 Family history of malignant neoplasm of digestive organs: Secondary | ICD-10-CM

## 2024-06-04 DIAGNOSIS — R4182 Altered mental status, unspecified: Secondary | ICD-10-CM | POA: Diagnosis present

## 2024-06-04 DIAGNOSIS — Z91041 Radiographic dye allergy status: Secondary | ICD-10-CM

## 2024-06-04 DIAGNOSIS — R569 Unspecified convulsions: Secondary | ICD-10-CM

## 2024-06-04 DIAGNOSIS — Z5941 Food insecurity: Secondary | ICD-10-CM

## 2024-06-04 DIAGNOSIS — Z5901 Sheltered homelessness: Secondary | ICD-10-CM

## 2024-06-04 DIAGNOSIS — Z813 Family history of other psychoactive substance abuse and dependence: Secondary | ICD-10-CM

## 2024-06-04 DIAGNOSIS — E8809 Other disorders of plasma-protein metabolism, not elsewhere classified: Secondary | ICD-10-CM | POA: Diagnosis present

## 2024-06-04 DIAGNOSIS — K219 Gastro-esophageal reflux disease without esophagitis: Secondary | ICD-10-CM | POA: Diagnosis present

## 2024-06-04 DIAGNOSIS — T424X5A Adverse effect of benzodiazepines, initial encounter: Secondary | ICD-10-CM | POA: Diagnosis present

## 2024-06-04 DIAGNOSIS — J9811 Atelectasis: Secondary | ICD-10-CM | POA: Diagnosis present

## 2024-06-04 DIAGNOSIS — R252 Cramp and spasm: Secondary | ICD-10-CM | POA: Diagnosis present

## 2024-06-04 DIAGNOSIS — R680 Hypothermia, not associated with low environmental temperature: Secondary | ICD-10-CM | POA: Diagnosis present

## 2024-06-04 DIAGNOSIS — F0284 Dementia in other diseases classified elsewhere, unspecified severity, with anxiety: Secondary | ICD-10-CM | POA: Diagnosis present

## 2024-06-04 DIAGNOSIS — T50915A Adverse effect of multiple unspecified drugs, medicaments and biological substances, initial encounter: Secondary | ICD-10-CM | POA: Diagnosis present

## 2024-06-04 DIAGNOSIS — J45909 Unspecified asthma, uncomplicated: Secondary | ICD-10-CM | POA: Diagnosis present

## 2024-06-04 DIAGNOSIS — G40909 Epilepsy, unspecified, not intractable, without status epilepticus: Secondary | ICD-10-CM | POA: Diagnosis present

## 2024-06-04 DIAGNOSIS — Z90710 Acquired absence of both cervix and uterus: Secondary | ICD-10-CM

## 2024-06-04 DIAGNOSIS — Z8711 Personal history of peptic ulcer disease: Secondary | ICD-10-CM

## 2024-06-04 DIAGNOSIS — G928 Other toxic encephalopathy: Secondary | ICD-10-CM | POA: Diagnosis not present

## 2024-06-04 DIAGNOSIS — W06XXXA Fall from bed, initial encounter: Secondary | ICD-10-CM | POA: Diagnosis present

## 2024-06-04 DIAGNOSIS — J9601 Acute respiratory failure with hypoxia: Secondary | ICD-10-CM | POA: Diagnosis present

## 2024-06-04 DIAGNOSIS — R0902 Hypoxemia: Principal | ICD-10-CM | POA: Diagnosis present

## 2024-06-04 DIAGNOSIS — Z8249 Family history of ischemic heart disease and other diseases of the circulatory system: Secondary | ICD-10-CM

## 2024-06-04 DIAGNOSIS — F02811 Dementia in other diseases classified elsewhere, unspecified severity, with agitation: Secondary | ICD-10-CM | POA: Diagnosis present

## 2024-06-04 DIAGNOSIS — Z880 Allergy status to penicillin: Secondary | ICD-10-CM

## 2024-06-04 DIAGNOSIS — Z833 Family history of diabetes mellitus: Secondary | ICD-10-CM

## 2024-06-04 DIAGNOSIS — Z885 Allergy status to narcotic agent status: Secondary | ICD-10-CM

## 2024-06-04 DIAGNOSIS — I10 Essential (primary) hypertension: Secondary | ICD-10-CM | POA: Diagnosis present

## 2024-06-04 DIAGNOSIS — M549 Dorsalgia, unspecified: Secondary | ICD-10-CM | POA: Diagnosis present

## 2024-06-04 LAB — COMPREHENSIVE METABOLIC PANEL WITH GFR
ALT: 13 U/L (ref 0–44)
AST: 37 U/L (ref 15–41)
Albumin: 3.2 g/dL — ABNORMAL LOW (ref 3.5–5.0)
Alkaline Phosphatase: 96 U/L (ref 38–126)
Anion gap: 10 (ref 5–15)
BUN: 15 mg/dL (ref 8–23)
CO2: 31 mmol/L (ref 22–32)
Calcium: 9.7 mg/dL (ref 8.9–10.3)
Chloride: 97 mmol/L — ABNORMAL LOW (ref 98–111)
Creatinine, Ser: 0.71 mg/dL (ref 0.44–1.00)
GFR, Estimated: >60 mL/min (ref >60)
Glucose, Bld: 91 mg/dL (ref 70–99)
Potassium: 3.5 mmol/L (ref 3.5–5.1)
Sodium: 138 mmol/L (ref 135–145)
Total Bilirubin: 0.6 mg/dL (ref 0.0–1.2)
Total Protein: 7.6 g/dL (ref 6.5–8.1)

## 2024-06-04 LAB — URINALYSIS, W/ REFLEX TO CULTURE (INFECTION SUSPECTED)
Bilirubin Urine: NEGATIVE
Glucose, UA: NEGATIVE mg/dL
Hgb urine dipstick: NEGATIVE
Ketones, ur: NEGATIVE mg/dL
Leukocytes,Ua: NEGATIVE
Nitrite: NEGATIVE
Protein, ur: NEGATIVE mg/dL
Specific Gravity, Urine: 1.023 (ref 1.005–1.030)
pH: 7 (ref 5.0–8.0)

## 2024-06-04 LAB — CBC WITH DIFFERENTIAL/PLATELET
Abs Immature Granulocytes: 0.06 K/uL (ref 0.00–0.07)
Basophils Absolute: 0 K/uL (ref 0.0–0.1)
Basophils Relative: 0 %
Eosinophils Absolute: 0.1 K/uL (ref 0.0–0.5)
Eosinophils Relative: 1 %
HCT: 39.7 % (ref 36.0–46.0)
Hemoglobin: 12.9 g/dL (ref 12.0–15.0)
Immature Granulocytes: 0 %
Lymphocytes Relative: 10 %
Lymphs Abs: 1.6 K/uL (ref 0.7–4.0)
MCH: 28.9 pg (ref 26.0–34.0)
MCHC: 32.5 g/dL (ref 30.0–36.0)
MCV: 89 fL (ref 80.0–100.0)
Monocytes Absolute: 1.4 K/uL — ABNORMAL HIGH (ref 0.1–1.0)
Monocytes Relative: 9 %
Neutro Abs: 11.8 K/uL — ABNORMAL HIGH (ref 1.7–7.7)
Neutrophils Relative %: 80 %
Platelets: 435 K/uL — ABNORMAL HIGH (ref 150–400)
RBC: 4.46 MIL/uL (ref 3.87–5.11)
RDW: 13.6 % (ref 11.5–15.5)
WBC: 15 K/uL — ABNORMAL HIGH (ref 4.0–10.5)
nRBC: 0 % (ref 0.0–0.2)

## 2024-06-04 LAB — ETHANOL: Alcohol, Ethyl (B): <15 mg/dL (ref <15)

## 2024-06-04 LAB — VITAMIN B12: Vitamin B-12: 737 pg/mL (ref 180–914)

## 2024-06-04 LAB — RAPID URINE DRUG SCREEN, HOSP PERFORMED
Amphetamines: NOT DETECTED
Barbiturates: NOT DETECTED
Benzodiazepines: POSITIVE — AB
Cocaine: NOT DETECTED
Opiates: NOT DETECTED
Tetrahydrocannabinol: NOT DETECTED

## 2024-06-04 LAB — RESP PANEL BY RT-PCR (RSV, FLU A&B, COVID)  RVPGX2
Influenza A by PCR: NEGATIVE
Influenza B by PCR: NEGATIVE
Resp Syncytial Virus by PCR: NEGATIVE
SARS Coronavirus 2 by RT PCR: NEGATIVE

## 2024-06-04 LAB — TROPONIN I (HIGH SENSITIVITY)
Troponin I (High Sensitivity): 16 ng/L (ref <18)
Troponin I (High Sensitivity): 15 ng/L (ref <18)

## 2024-06-04 LAB — CK: Total CK: 1482 U/L — ABNORMAL HIGH (ref 38–234)

## 2024-06-04 LAB — PROTIME-INR
INR: 1.1 (ref 0.8–1.2)
Prothrombin Time: 14.4 s (ref 11.4–15.2)

## 2024-06-04 LAB — TSH: TSH: 0.394 u[IU]/mL (ref 0.350–4.500)

## 2024-06-04 LAB — I-STAT CG4 LACTIC ACID, ED
Lactic Acid, Venous: 1.4 mmol/L (ref 0.5–1.9)
Lactic Acid, Venous: 1.2 mmol/L (ref 0.5–1.9)

## 2024-06-04 LAB — VALPROIC ACID LEVEL: Valproic Acid Lvl: 14 ug/mL — ABNORMAL LOW (ref 50–100)

## 2024-06-04 MED ORDER — ROSUVASTATIN CALCIUM 5 MG PO TABS
5.0000 mg | ORAL_TABLET | Freq: Every day | ORAL | Status: DC
  Administered 2024-06-04 – 2024-06-05 (×2): 5 mg via ORAL
  Filled 2024-06-04 (×2): qty 1

## 2024-06-04 MED ORDER — ORAL CARE MOUTH RINSE: 15.0000 mL | OROMUCOSAL | Status: DC | PRN

## 2024-06-04 MED ORDER — ENSURE PLUS HIGH PROTEIN PO LIQD
237.0000 mL | Freq: Two times a day (BID) | ORAL | Status: DC
  Administered 2024-06-05 – 2024-06-06 (×3): 237 mL via ORAL

## 2024-06-04 MED ORDER — LORAZEPAM 1 MG PO TABS: 1.0000 mg | ORAL_TABLET | ORAL | Status: DC | PRN

## 2024-06-04 MED ORDER — SODIUM CHLORIDE 0.9 % IV BOLUS
1000.0000 mL | Freq: Once | INTRAVENOUS | Status: AC
  Administered 2024-06-04: 1000 mL via INTRAVENOUS

## 2024-06-04 MED ORDER — THIAMINE MONONITRATE 100 MG PO TABS
100.0000 mg | ORAL_TABLET | Freq: Every day | ORAL | Status: DC
  Administered 2024-06-04 – 2024-06-06 (×3): 100 mg via ORAL
  Filled 2024-06-04 (×3): qty 1

## 2024-06-04 MED ORDER — DIAZEPAM 5 MG PO TABS: 0.0000 mg | ORAL_TABLET | ORAL | Status: DC | PRN

## 2024-06-04 MED ORDER — SODIUM CHLORIDE 0.9 % IV SOLN
2.0000 g | Freq: Once | INTRAVENOUS | Status: AC
  Administered 2024-06-04: 2 g via INTRAVENOUS
  Filled 2024-06-04: qty 20

## 2024-06-04 MED ORDER — FLUTICASONE FUROATE-VILANTEROL 100-25 MCG/ACT IN AEPB
1.0000 | INHALATION_SPRAY | Freq: Every day | RESPIRATORY_TRACT | Status: DC
  Administered 2024-06-05 – 2024-06-06 (×2): 1 via RESPIRATORY_TRACT
  Filled 2024-06-04: qty 28

## 2024-06-04 MED ORDER — LEVETIRACETAM 500 MG PO TABS
1000.0000 mg | ORAL_TABLET | Freq: Two times a day (BID) | ORAL | Status: DC
  Administered 2024-06-05 – 2024-06-06 (×3): 1000 mg via ORAL
  Filled 2024-06-04 (×3): qty 2

## 2024-06-04 MED ORDER — LACTATED RINGERS IV SOLN: INTRAVENOUS | Status: AC

## 2024-06-04 MED ORDER — THIAMINE HCL 100 MG/ML IJ SOLN
100.0000 mg | Freq: Every day | INTRAMUSCULAR | Status: DC
Start: 2024-06-04 — End: 2024-06-04

## 2024-06-04 MED ORDER — DIVALPROEX SODIUM ER 500 MG PO TB24
500.0000 mg | ORAL_TABLET | Freq: Two times a day (BID) | ORAL | Status: DC
  Administered 2024-06-04 – 2024-06-06 (×4): 500 mg via ORAL
  Filled 2024-06-04 (×5): qty 1

## 2024-06-04 MED ORDER — AMLODIPINE BESYLATE 5 MG PO TABS
5.0000 mg | ORAL_TABLET | Freq: Every day | ORAL | Status: DC
  Administered 2024-06-04 – 2024-06-06 (×3): 5 mg via ORAL
  Filled 2024-06-04 (×3): qty 1

## 2024-06-04 MED ORDER — LORAZEPAM 2 MG/ML IJ SOLN: 1.0000 mg | INTRAMUSCULAR | Status: DC | PRN

## 2024-06-04 MED ORDER — ALBUTEROL SULFATE (2.5 MG/3ML) 0.083% IN NEBU: 2.5000 mg | INHALATION_SOLUTION | RESPIRATORY_TRACT | Status: DC | PRN

## 2024-06-04 MED ORDER — DIAZEPAM 5 MG/ML IJ SOLN: 0.0000 mg | INTRAMUSCULAR | Status: DC | PRN

## 2024-06-04 MED ORDER — ENOXAPARIN SODIUM 40 MG/0.4ML IJ SOSY
40.0000 mg | PREFILLED_SYRINGE | INTRAMUSCULAR | Status: DC
Start: 2024-06-04 — End: 2024-06-06
  Administered 2024-06-04 – 2024-06-05 (×2): 40 mg via SUBCUTANEOUS
  Filled 2024-06-04: qty 0.4

## 2024-06-04 MED ORDER — FOLIC ACID 1 MG PO TABS
1.0000 mg | ORAL_TABLET | Freq: Every day | ORAL | Status: DC
  Administered 2024-06-04 – 2024-06-06 (×3): 1 mg via ORAL
  Filled 2024-06-04 (×3): qty 1

## 2024-06-04 MED ORDER — HYDROCHLOROTHIAZIDE 25 MG PO TABS
25.0000 mg | ORAL_TABLET | Freq: Every day | ORAL | Status: DC
  Administered 2024-06-04 – 2024-06-06 (×3): 25 mg via ORAL
  Filled 2024-06-04 (×4): qty 1

## 2024-06-04 MED ORDER — LEVETIRACETAM (KEPPRA) 500 MG/5 ML ADULT IV PUSH
1500.0000 mg | Freq: Once | INTRAVENOUS | Status: AC
  Administered 2024-06-04: 1500 mg via INTRAVENOUS
  Filled 2024-06-04: qty 15

## 2024-06-04 MED ORDER — ACETAMINOPHEN 500 MG PO TABS
1000.0000 mg | ORAL_TABLET | Freq: Four times a day (QID) | ORAL | Status: DC | PRN
  Administered 2024-06-05 (×4): 1000 mg via ORAL
  Filled 2024-06-04 (×4): qty 2

## 2024-06-04 MED ORDER — ADULT MULTIVITAMIN W/MINERALS CH
1.0000 | ORAL_TABLET | Freq: Every day | ORAL | Status: DC
  Administered 2024-06-04 – 2024-06-06 (×3): 1 via ORAL
  Filled 2024-06-04 (×3): qty 1

## 2024-06-04 NOTE — ED Triage Notes (Signed)
 Patient is coming from a motel. Patient was found between the beds. Daughter on scene was unhelpful and unable to provide information. Daughter gave patient xanax , unknown amount and is not prescribed the medication. Current GCS of 14. No obvious injury EMS VS 110/60 BP 89 CBG 90 HR

## 2024-06-04 NOTE — ED Provider Notes (Signed)
 [facility] Emergency Department Provider Note MRN:  [id number]  Arrival date & time: 06/04/24     Chief Complaint   Fall   History of Present Illness   Lindsey [name] is a 68 y.o. year-old female with a history of hypertension, drug and alcohol use, seizure disorder presenting to the ED with chief complaint of fall.  Patient reportedly fell out of bed and was found on the ground.  Patient is altered.  Reportedly daughter gave her a Xanax  to help her sleep.  Review of Systems  I was unable to obtain a full/accurate HPI, PMH, or ROS due to the patient's altered mental status.  Patient's Health History    Past Medical History:  Diagnosis Date   Arthritis    Asthma    Drug abuse (HCC)    recovered   GERD (gastroesophageal reflux disease)    H/O ETOH abuse    HTN (hypertension)    Seizure (HCC)     Past Surgical History:  Procedure Laterality Date   ABDOMINAL HYSTERECTOMY     BIOPSY  09/14/2022   Procedure: BIOPSY;  Surgeon: Aneita Gwendlyn DASEN, MD;  Location: [facility];  Service: Gastroenterology;;   COLONOSCOPY WITH PROPOFOL  N/A 03/18/2022   Procedure: COLONOSCOPY WITH PROPOFOL ;  Surgeon: Teressa Toribio SQUIBB, MD;  Location: [facility];  Service: Gastroenterology;  Laterality: N/A;   ESOPHAGOGASTRODUODENOSCOPY (EGD) WITH PROPOFOL  N/A 09/14/2022   Procedure: ESOPHAGOGASTRODUODENOSCOPY (EGD) WITH PROPOFOL ;  Surgeon: Aneita Gwendlyn DASEN, MD;  Location: [facility];  Service: Gastroenterology;  Laterality: N/A;   GALLBLADDER SURGERY     peptic ulcer surgery     POLYPECTOMY  03/18/2022   Procedure: POLYPECTOMY;  Surgeon: Teressa Toribio SQUIBB, MD;  Location: [facility];  Service: Gastroenterology;;   HARLEY DILATION N/A 09/14/2022   Procedure: HARLEY DILATION;  Surgeon: Aneita Gwendlyn DASEN, MD;  Location: [facility];  Service: Gastroenterology;  Laterality: N/A;    Family History  Problem Relation Age of Onset   Seizures Mother    Alcohol abuse Mother     Heart attack Father    Cancer Daughter        type unknown   Diabetes Daughter    Hypertension Daughter    Drug abuse Brother    Heart attack Brother    Pancreatic cancer Brother    BRCA 1/2 Neg Hx    Breast cancer Neg Hx     Social History   Socioeconomic History   Marital status: Legally Separated    Spouse name: Not on file   Number of children: 2   Years of education: 12   Highest education level: Not on file  Occupational History   Occupation: Disability  Tobacco Use   Smoking status: Some Days    Types: Cigarettes   Smokeless tobacco: Never  Vaping Use   Vaping status: Never Used  Substance and Sexual Activity   Alcohol use: No    Comment: Hx of alchol abuse, not currently drinking   Drug use: Not Currently    Types: Marijuana    Comment: every now and then   Sexual activity: Not on file  Other Topics Concern   Not on file  Social History Narrative   Lives at home w/ her daughter   Right-handed   Caffeine: several coffees and sodas daily   Social Drivers of Corporate investment banker Strain: Not on file  Food Insecurity: Food Insecurity Present (03/12/2024)   Hunger Vital Sign    Worried About Running Out  of Food in the Last Year: Sometimes true    Ran Out of Food in the Last Year: Sometimes true  Transportation Needs: Unmet Transportation Needs (03/12/2024)   PRAPARE - Administrator, Civil Service (Medical): Yes    Lack of Transportation (Non-Medical): No  Physical Activity: Not on file  Stress: Not on file  Social Connections: Moderately Isolated (03/12/2024)   Social Connection and Isolation Panel    Frequency of Communication with Friends and Family: Once a week    Frequency of Social Gatherings with Friends and Family: Once a week    Attends Religious Services: More than 4 times per year    Active Member of [employer] or Organizations: Yes    Attends Banker Meetings: Never    Marital Status: Never married  Intimate Partner  Violence: Not At Risk (03/12/2024)   Humiliation, Afraid, Rape, and Kick questionnaire    Fear of Current or Ex-Partner: No    Emotionally Abused: No    Physically Abused: No    Sexually Abused: No     Physical Exam   Vitals:   06/04/24 0557 06/04/24 0608  BP:    Pulse:    Resp:    Temp: (!) 97.4 F (36.3 C) 98.6 F (37 C)  SpO2:      CONSTITUTIONAL: Ill-appearing, NAD, cool to the touch NEURO/PSYCH: Somnolent wakes to voice occasionally follows commands EYES:  eyes equal and reactive ENT/NECK:  no LAD, no JVD CARDIO: Regular rate PULM:  CTAB no wheezing or rhonchi GI/GU:  non-distended, non-tender MSK/SPINE:  No gross deformities, no edema SKIN:  no rash, atraumatic   *Additional and/or pertinent findings included in MDM below  Diagnostic and Interventional Summary    EKG Interpretation Date/Time:  Monday June 04 2024 06:44:13 EDT Ventricular Rate:  89 PR Interval:  159 QRS Duration:  84 QT Interval:  398 QTC Calculation: 485 R Axis:   45  Text Interpretation: Sinus rhythm Borderline T wave abnormalities Confirmed by Theadore Sharper [phone]) on 06/04/2024 6:47:53 AM       Labs Reviewed  CBC WITH DIFFERENTIAL/PLATELET - Abnormal; Notable for the following components:      Result Value   WBC 15.0 (*)    Platelets 435 (*)    Neutro Abs 11.8 (*)    Monocytes Absolute 1.4 (*)    All other components within normal limits  CULTURE, BLOOD (ROUTINE X 2)  CULTURE, BLOOD (ROUTINE X 2)  RESP PANEL BY RT-PCR (RSV, FLU A&B, COVID)  RVPGX2  COMPREHENSIVE METABOLIC PANEL WITH GFR  PROTIME-INR  URINALYSIS, W/ REFLEX TO CULTURE (INFECTION SUSPECTED)  RAPID URINE DRUG SCREEN, HOSP PERFORMED  ETHANOL  CK  I-STAT CG4 LACTIC ACID, ED    DG Chest Port 1 View    (Results Pending)  CT HEAD WO CONTRAST ( )    (Results Pending)  CT CERVICAL SPINE WO CONTRAST    (Results Pending)    Medications  cefTRIAXone  (ROCEPHIN ) 2 g in sodium chloride  0.9 % 100 mL IVPB (has no  administration in time range)  sodium chloride  0.9 % bolus 1,000 mL (has no administration in time range)     Procedures  /  Critical Care .Critical Care  Performed by: Theadore Sharper HERO, MD Authorized by: Theadore Sharper HERO, MD   Critical care provider statement:    Critical care time (minutes):  32   Critical care was necessary to treat or prevent imminent or life-threatening deterioration of the following conditions:  Sepsis  Critical care was time spent personally by me on the following activities:  Development of treatment plan with patient or surrogate, discussions with consultants, evaluation of patient's response to treatment, examination of patient, ordering and review of laboratory studies, ordering and review of radiographic studies, ordering and performing treatments and interventions, pulse oximetry, re-evaluation of patient's condition and review of old charts   ED Course and Medical Decision Making  Initial Impression and Ddx Differential diagnosis is broad and includes sepsis, intracranial bleeding from trauma, postictal state, electrolyte disturbance, drug or alcohol intoxication.  Given her soft blood pressures, tachypnea, altered mental status, code sepsis initiated.  Has had a recent UTI that she was hospitalized for.  Past medical/surgical history that increases complexity of ED encounter: Drug and alcohol use, hypertension  Interpretation of Diagnostics I personally reviewed the EKG and my interpretation is as follows: Sinus rhythm  Labs, CT pending  Patient Reassessment and Ultimate Disposition/Management     Patient is a code sepsis receiving IV antibiotics, anticipating admission.  Signed out to oncoming provider at shift change.  Patient management required discussion with the following services or consulting groups:  None  Complexity of Problems Addressed Acute illness or injury that poses threat of life of bodily function  Additional Data Reviewed and  Analyzed Further history obtained from: Recent discharge summary and Prior labs/imaging results  Additional Factors Impacting ED Encounter Risk Consideration of hospitalization  Ozell HERO. Theadore, MD [facility] Emergency Medicine [facility] [email or web address]  Final Clinical Impressions(s) / ED Diagnoses  No diagnosis found.  ED Discharge Orders     None        Discharge Instructions Discussed with and Provided to Patient:   Discharge Instructions   None      Theadore Ozell HERO, MD 06/04/24 [phone]

## 2024-06-04 NOTE — Hospital Course (Signed)
 All she knows is that she fell. Tells us  to call her daughter. Was on the floor, and daughter said why she isnt inher bed, hasn't been her floor all night? Has a history of asthma, uses her inhaler ome in the morning and one at night. Had two seizures.

## 2024-06-04 NOTE — Progress Notes (Signed)
 Substance use resources added to AVS

## 2024-06-04 NOTE — H&P (Signed)
 Date: 06/04/2024               Patient Name:  Lindsey [name] MRN: [id number]  DOB: [birth year] Age / Sex: 68 y.o., female   PCP: [facility], [facility]         Medical Service: Internal Medicine Teaching Service         Attending Physician: Dr. Dayton Eastern      First Contact: Letha Cheadle, MD    Second Contact: Dr. Roetta Chars, MD          Pager Information: First Contact Pager: [phone]   Second Contact Pager: [phone]   SUBJECTIVE   Chief Complaint: AMS  History of Present Illness  Lindsey [name] is a 68 y.o. female with PMHx of seizure disorder, asthma, hypertension, and GERD who presents to the ED for altered mental status. The patient was a poor historian and still a little confused so most of the history is obtained from her daughter and EMS report.  The patient does complain of chest pain to us  but denies any shortness of breath.  She reports taking her Keppra  and Depakote  and did not miss any doses.  Per her daughter Thurnell: The patient has had trouble walking recently and had surgical placement of a rod in one of her legs.  She recently came home from a rehabilitation center on Saturday.  On Sunday, before bed, the patient's daughter gave her medicine when she went to bed.  At around 0230-0400 in the morning, the patient woke up and tried to sit up at which point she fell to the ground.  The patient was reportedly still half-asleep, confused, and had problems with her speech.  The daughter does not think that the patient had a seizure because she was not shaking.  She has previously witnessed prior seizure episodes.  The patient has had cognitive issues recently and sometimes her speech has not been completely audible or intelligible.  The patient's daughter was unable to lift her from the ground, so she called EMS.  The patient was reportedly complaining of pain to her back and legs.  Per EMS, patient's daughter had given the patient a Xanax  to help her sleep.  The daughter was  unable to explain how the patient fell.  EMS found the patient in a very unclean and cluttered hotel room with unsuitable living conditions.  Upon arrival to the ED, ED staff noticed the patient covered in bedbugs.  ED Course:  Vitals: Found to be hypothermic with rectal temperature of 96.8, tachycardic up to 114 Labs significant for WBC of 16 Imaging: CT head negative for acute process, showing chronic changes.  CXR negative for acute abnormalities.  CT chest showing atelectasis in the right lower lobe with airway plugging in bilateral lower lobes. Received Keppra  1500mg , ceftriaxone  2g, 1L bolus of NS Consults: None  Meds  No outpatient medications have been marked as taking for the 06/04/24 encounter [facility] Encounter).     Allergies  Allergies as of 06/04/2024 - Review Complete 06/04/2024  Allergen Reaction Noted   Iodinated contrast media Shortness Of Breath, Itching, and Other (See Comments) 04/12/2023   Oxycodone  Rash and Other (See Comments) 04/13/2023   Ibuprofen  Swelling and Other (See Comments) 04/17/2023   Kiwi extract Hives and Itching 11/03/2015   Morphine  and codeine  Hives and Other (See Comments) 11/05/2013   Penicillins Hives and Other (See Comments) 02/25/2012   Strawberry extract Hives and Itching 04/25/2015     Past Medical History Asthma GERD Hypertension Seizure  disorder Substance use disorder Tobacco use disorder Alcohol abuse   Past Surgical History Past Surgical History:  Procedure Laterality Date   ABDOMINAL HYSTERECTOMY     BIOPSY  09/14/2022   Procedure: BIOPSY;  Surgeon: Aneita Gwendlyn DASEN, MD;  Location: [facility];  Service: Gastroenterology;;   COLONOSCOPY WITH PROPOFOL  N/A 03/18/2022   Procedure: COLONOSCOPY WITH PROPOFOL ;  Surgeon: Teressa Toribio SQUIBB, MD;  Location: [facility];  Service: Gastroenterology;  Laterality: N/A;   ESOPHAGOGASTRODUODENOSCOPY (EGD) WITH PROPOFOL  N/A 09/14/2022   Procedure: ESOPHAGOGASTRODUODENOSCOPY (EGD) WITH  PROPOFOL ;  Surgeon: Aneita Gwendlyn DASEN, MD;  Location: [facility];  Service: Gastroenterology;  Laterality: N/A;   GALLBLADDER SURGERY     peptic ulcer surgery     POLYPECTOMY  03/18/2022   Procedure: POLYPECTOMY;  Surgeon: Teressa Toribio SQUIBB, MD;  Location: [facility];  Service: Gastroenterology;;   HARLEY DILATION N/A 09/14/2022   Procedure: HARLEY DILATION;  Surgeon: Aneita Gwendlyn DASEN, MD;  Location: [facility];  Service: Gastroenterology;  Laterality: N/A;     Social History  Living Situation: Homeless, was previously living in a hotel Occupation: N/A Support: Daughter lives in the area with the patient; they do not own a car Level of Function: Independent, able to complete all ADLs and IADLs PCP: Health, [address]  Substances: -Tobacco: Former smoker, denies current smoking -Alcohol: Former drinker, last drink 10 years ago -Recreational Drug: Cocaine, last use 10 years ago  Family History  Family History  Problem Relation Age of Onset   Seizures Mother    Alcohol abuse Mother    Heart attack Father    Cancer Daughter        type unknown   Diabetes Daughter    Hypertension Daughter    Drug abuse Brother    Heart attack Brother    Pancreatic cancer Brother    BRCA 1/2 Neg Hx    Breast cancer Neg Hx      Review of Systems  A complete ROS was negative except as per HPI.   OBJECTIVE:   Physical Exam: Blood pressure 120/63, pulse (!) 112, temperature 98.7 F (37.1 C), temperature source Oral, resp. rate 20, SpO2 100%.  Constitutional: well-appearing, well-nourished, in no acute distress HENT: hypopigmented scar tissue with continuous patches of missing hair on head, mucous membranes moist Eyes: conjunctiva non-erythematous, PERRL, no scleral icterus Cardiovascular: tachycardic with regular rhythm, no m/r/g Pulmonary/Chest: normal work of breathing on 3L [place], lungs clear to auscultation bilaterally Abdominal: soft, non-tender, non-distended, bowel sounds normal MSK:  normal bulk and tone Neurological: alert & oriented to person and place but not situation; she is confused but with intelligible speech, requiring redirection.  Follows commands; able to lift bilateral legs against gravity but required assistance to sit up in bed Skin: warm and dry Extremities: Mild bilateral lower extremity edema, no cyanosis; peripheral pulses intact Psych: normal mood and affect, thought content normal  Labs: CBC    Component Value Date/Time   WBC 15.0 (H) 06/04/2024 0629   RBC 4.46 06/04/2024 0629   HGB 12.9 06/04/2024 0629   HGB 13.8 01/28/2014 1826   HCT 39.7 06/04/2024 0629   HCT 41.9 01/28/2014 1826   PLT 435 (H) 06/04/2024 0629   PLT 414 01/28/2014 1826   MCV 89.0 06/04/2024 0629   MCV 94 01/28/2014 1826   MCH 28.9 06/04/2024 0629   MCHC 32.5 06/04/2024 0629   RDW 13.6 06/04/2024 0629   RDW 14.2 01/28/2014 1826   LYMPHSABS 1.6 06/04/2024 0629   LYMPHSABS 2.5  10/16/2013 0439   MONOABS 1.4 (H) 06/04/2024 0629   MONOABS 0.9 10/16/2013 0439   EOSABS 0.1 06/04/2024 0629   EOSABS 0.0 10/16/2013 0439   BASOSABS 0.0 06/04/2024 0629   BASOSABS 0.0 10/16/2013 0439     CMP     Component Value Date/Time   NA 138 06/04/2024 0629   NA 137 01/28/2014 1826   K 3.5 06/04/2024 0629   K 4.8 01/28/2014 1826   CL 97 (L) 06/04/2024 0629   CL 105 01/28/2014 1826   CO2 31 06/04/2024 0629   CO2 23 01/28/2014 1826   GLUCOSE 91 06/04/2024 0629   GLUCOSE 82 01/28/2014 1826   BUN 15 06/04/2024 0629   BUN 15 01/28/2014 1826   CREATININE 0.71 06/04/2024 0629   CREATININE 0.77 01/28/2014 1826   CALCIUM  9.7 06/04/2024 0629   CALCIUM  9.4 01/28/2014 1826   PROT 7.6 06/04/2024 0629   PROT 8.2 01/28/2014 1826   ALBUMIN 3.2 (L) 06/04/2024 0629   ALBUMIN 3.9 01/28/2014 1826   AST 37 06/04/2024 0629   AST 38 (H) 01/28/2014 1826   ALT 13 06/04/2024 0629   ALT 24 01/28/2014 1826   ALKPHOS 96 06/04/2024 0629   ALKPHOS 112 01/28/2014 1826   BILITOT 0.6 06/04/2024 0629    BILITOT 0.8 01/28/2014 1826   GFRNONAA >60 06/04/2024 0629   GFRNONAA >60 01/28/2014 1826   GFRAA >60 06/29/2020 1315   GFRAA >60 01/28/2014 1826    Imaging: CT CHEST ABDOMEN PELVIS WO CONTRAST Result Date: 06/04/2024 CLINICAL DATA:  Sepsis EXAM: CT CHEST, ABDOMEN AND PELVIS WITHOUT CONTRAST TECHNIQUE: Multidetector CT imaging of the chest, abdomen and pelvis was performed following the standard protocol without IV contrast. RADIATION DOSE REDUCTION: This exam was performed according to the departmental dose-optimization program which includes automated exposure control, adjustment of the mA and/or kV according to patient size and/or use of iterative reconstruction technique. COMPARISON:  05/10/2023 and 02/02/2024 CT scans FINDINGS: CT CHEST FINDINGS Cardiovascular: Coronary, aortic arch, and branch vessel atherosclerotic vascular disease. Mediastinum/Nodes: No significant findings Lungs/Pleura: Paraseptal emphysema. Volume loss and bandlike atelectasis in the right lower lobe with airway plugging in both lower lobes. Lesser peripheral atelectasis posteriorly in the lingula posterior basal segment left lower lobe. Stable 3 mm right upper lobe nodule on image 32 series 5. Medial right upper lobe nodule on image 38 series 5 measures 8 by 6 by 4 mm (volume = 100 mm^3) with somewhat ill-defined margins. This was deemed a suspicious nodule on 02/02/2024 ; the appearance is relatively stable on today's four-month follow up. Musculoskeletal: Mild thoracic spondylosis.  Thoracic kyphosis. CT ABDOMEN PELVIS FINDINGS Hepatobiliary: Cholecystectomy. Pancreas: Unremarkable Spleen: Unremarkable Adrenals/Urinary Tract: Renal cysts as on prior exams. No further imaging workup of these lesions is indicated. Adrenal glands unremarkable. Stomach/Bowel: Prominent stool throughout the colon favors constipation. Vascular/Lymphatic: Atherosclerosis is present, including aortoiliac atherosclerotic disease. Reproductive: Uterus absent.   Adnexa unremarkable. Other: No supplemental non-categorized findings. Musculoskeletal: There is likely moderate bilateral foraminal impingement at L5-S1 due to intervertebral and facet spurring. Moderate degenerative hip arthropathy on the left. IMPRESSION: 1. A specific cause for the patient's sepsis is not identified. 2. Volume loss and bandlike atelectasis in the right lower lobe with airway plugging in both lower lobes. Lesser peripheral atelectasis posteriorly in the lingula and posterior basal segment left lower lobe. 3. Stable 100 cubic mm (mean diameter 7 mm) medial right upper lobe nodule with somewhat ill-defined margins. This was deemed a suspicious nodule on 02/02/2024 ; the appearance  is relatively stable on today's four-month follow up. I recommend either close CT surveillance in 3-6 months time, or PET-CT for further characterization. 4. Prominent stool throughout the colon favors constipation. 5. Moderate bilateral foraminal impingement at L5-S1 due to intervertebral and facet spurring. 6. Moderate degenerative hip arthropathy on the left. 7. Aortic Atherosclerosis (ICD10-I70.0) and Emphysema (ICD10-J43.9). Electronically Signed   By: Ryan Salvage M.D.   On: 06/04/2024 14:05   DG Chest Port 1 View Result Date: 06/04/2024 CLINICAL DATA:  68 year old female found down next to bed. Altered mental status. Possible sepsis. EXAM: PORTABLE CHEST 1 VIEW COMPARISON:  Portable chest 05/11/2024 and earlier. FINDINGS: Portable AP semi upright view at 0713 hours. Mildly improved lung volumes from last month. Calcified aortic atherosclerosis. Normal cardiac size and mediastinal contours. Visualized tracheal air column is within normal limits. Lung markings appear stable from last year. No pneumothorax, pulmonary edema, pleural effusion or confluent lung opacity. No acute osseous abnormality identified. Paucity of bowel gas the visible abdomen. IMPRESSION: No acute cardiopulmonary abnormality. Electronically  Signed   By: VEAR Hurst M.D.   On: 06/04/2024 07:45   CT CERVICAL SPINE WO CONTRAST Result Date: 06/04/2024 CLINICAL DATA:  68 year old female found down next to bed. Altered mental status. EXAM: CT CERVICAL SPINE WITHOUT CONTRAST TECHNIQUE: Multidetector CT imaging of the cervical spine was performed without intravenous contrast. Multiplanar CT image reconstructions were also generated. RADIATION DOSE REDUCTION: This exam was performed according to the departmental dose-optimization program which includes automated exposure control, adjustment of the mA and/or kV according to patient size and/or use of iterative reconstruction technique. COMPARISON:  Head CT today. Cervical spine CT 05/11/2024 and earlier. FINDINGS: Alignment: Increased cervical lordosis. Mildly increased degenerative appearing retrolisthesis of C4 on C5, C7 on T1. Alignment similar to 02/19/2024 comparison. Maintained bilateral posterior element alignment. Skull base and vertebrae: Chronic hyperostosis. Bone mineralization remains within normal limits. Visualized skull base is intact. No atlanto-occipital dissociation. C1 and C2 appear intact, aligned. Evidence of chronic traumatic injury at C5-C6 with bulky chronic heterotopic ossification there (series 3, image 49), unchanged. No acute osseous abnormality identified. Soft tissues and spinal canal: No prevertebral fluid or swelling. No visible canal hematoma. Bulky chronic heterotopic ossification at the right C5-C6 level extending into the spinal canal is unchanged (series 3, image 49). Disc levels: Stable. Outside of chronic changes on the right at C5-C6, disc and endplate degeneration is maximal at C4-C5. Upper chest: Visible upper thoracic levels appear intact. Motion artifact at the noncontrast thoracic inlet which otherwise appears negative. IMPRESSION: 1. No acute traumatic injury identified in the cervical spine. 2. Stable chronic posttraumatic appearance of C5-C6 on the right with bulky  chronic heterotopic ossification in the spinal canal there. Electronically Signed   By: VEAR Hurst M.D.   On: 06/04/2024 07:13   CT HEAD WO CONTRAST ( ) Result Date: 06/04/2024 CLINICAL DATA:  68 year old female found down next to bed. Altered mental status. EXAM: CT HEAD WITHOUT CONTRAST TECHNIQUE: Contiguous axial images were obtained from the base of the skull through the vertex without intravenous contrast. RADIATION DOSE REDUCTION: This exam was performed according to the departmental dose-optimization program which includes automated exposure control, adjustment of the mA and/or kV according to patient size and/or use of iterative reconstruction technique. COMPARISON:  Head CT 05/11/2024 and earlier. FINDINGS: Brain: Stable cerebral volume. No midline shift, ventriculomegaly, mass effect, evidence of mass lesion, intracranial hemorrhage or evidence of cortically based acute infarction. Patchy mild to moderate for age periventricular white  matter hypodensity, most pronounced in the left corona radiata and stable. Vascular: Calcified atherosclerosis at the skull base. No suspicious intracranial vascular hyperdensity. Skull: Stable, intact.  No acute osseous abnormality identified. Sinuses/Orbits: Visualized paranasal sinuses and mastoids are stable and well aerated. Left sphenoid sinus bubbly opacity, regressed from last month. Other: No acute orbit or scalp soft tissue finding identified. IMPRESSION: 1. No acute intracranial abnormality or acute traumatic injury identified. 2. Stable non contrast CT appearance of chronic white matter disease, most pronounced in the left corona radiata. Electronically Signed   By: VEAR Hurst M.D.   On: 06/04/2024 07:09    EKG: personally reviewed my interpretation is NSR which is consistent with prior EKG.  ASSESSMENT & PLAN:   Assessment & Plan by Problem: Active Problems:   Altered mental status   Lindsey [name] is a female living with a history of seizure disorder,  asthma, hypertension, substance use disorder and GERD who presented with altered mental status with witnessed fall and was admitted for acute encephalopathy on hospital day 0.  #Acute encephalopathy #Cognitive decline #Seizure disorder #Fall Patient brought in by EMS after reported fall witnessed by daughter.  Unclear if she experienced a post-ictal state, however the patient is unable to provide good history and appears confused.  Initially hypothermic with rectal temperature.  UA negative.  On exam she appears encephalopathic.  Valproic  acid level is low, may be etiology for a breakthrough seizure/fall.  UDS positive for benzodiazepines which is consistent with reported history.  Encephalopathy and fall are likely multifactorial in nature including use of benzodiazepines and breakthrough seizure from medication non-adherence. Elevated CK indicating the patient may have been down on the ground for a long period of time; renal function intact.  We will hold her centrally acting medications.  She received 1 dose of Rocephin  in the ED due to concern for UTI, but patient is asymptomatic from this with minimal bacteria on UA, so we will discontinue antibiotics. - Cardiac telemetry - Keppra  level pending -TSH wnl, vitamin B12 level pending - Start home Depakote  ER 750 mg twice daily - Start home Keppra  1000 mg daily (received 1500 mg IV injection in the ED) - Pain regimen: - Acetaminophen  1000 mg every 6 hours for mild/moderate pain - Hold home escitalopram, gabapentin , tramadol     #Chest Pain Patient reporting sharp, knife-like chest pain in the ED.  Unable to give clear history due to mental status.  EKG without T wave/ST changes.  In the setting of recent fall, this is most likely musculoskeletal in nature.  However, since the patient is unable to provide good history we will order troponins to follow. - Troponin 16-> - Telemetry as above  #Tachycardia #Leukocytosis #Atelectasis Patient with  increased oxygen requirement and shortness of breath.  The patient does not use oxygen at baseline.  She does have a reported history of asthma.  On exam, the lungs are CTAB.  CT imaging showing right lower lobe atelectasis and airway plugging in both lower lobes.  Her symptoms are likely in the setting of atelectasis due to positional compression of the lungs.  Although WBCs are elevated, lower suspicion for pulmonary infection due to lack of CT imaging findings.  Initially considered PE given Wells score of 3 and PERC score of 2.  Will order D-dimer and only further evaluate with CT angiography if elevated.  Tachycardia has also improved without intervention and she has been weaned to room air.  Without intervention.  Lactate within normal limits, low  suspicion for sepsis but we will follow-up blood cultures and initiate antibiotics if positive. - Blood cultures x2 pending - Start maintenance fluids at 100 cc/hour for 10 hours - COVID/flu negative - D-dimer pending - Flutter valve - Incentive spirometry  #Asthma Not currently wheezing and lungs are CTAB, however reports increased inhaler use at home.  Low concern for asthma exacerbation at this time, will continue home inhaler and monitor. - Breo Ellipta  inhaler daily - Albuterol  nebulizer solution every 4 hours as needed  #Substance Use Disorder #Alcohol Use Disorder Patient has reported history of cocaine and alcohol use greater than 10 years ago.  Even though patient reports no recent alcohol use and ethyl alcohol levels less than 15, we will still start CIWA protocol as cautionary measure.  Will not use Ativan  as her current clinical state may be in part due to benzodiazepine use. - CIWA protocol without Ativan  - Thiamine  supplementation - Folate 1 mg  #Hypoalbuminemia Albumin decreased to 3.2 on admission.  Likely in the setting of poor p.o. intake given history of housing insecurity.  Will give her protein supplementation. - Ensure with  high-protein supplementation - Daily multivitamin  #Hypertension BP normotensive on arrival, will continue home medications and monitor. - Continue home amlodipine  5 mg daily - Continue home hydrochlorothiazide  25 mg daily  #Hyperlipidemia No lipid panel seen during chart review.  Consider outpatient follow-up. - Continue Crestor  5 mg daily  Best practice: Diet: Soft diet VTE: Enoxaparin  IVF: LR,100cc/hr Code: Full  Disposition planning: Prior to Admission Living Arrangement: Hotel with daughter Anticipated Discharge Location: Home  Dispo: Admit patient to Observation with expected length of stay less than 2 midnights.  Signed: Rishab Stoudt, MD [facility]  PGY-1 06/04/2024, 6:11 PM  Please contact IM Residency On-Call Pager at: [phone] or [phone].

## 2024-06-04 NOTE — ED Notes (Signed)
 Patient transported to CT

## 2024-06-04 NOTE — ED Notes (Signed)
 Pt rectal temp 96.8. Bair hugger applied.

## 2024-06-04 NOTE — ED Notes (Signed)
 Phlebotomy attempted to collect second set of blood cultures three times, unable to collect. EDP Bero, MD made aware.

## 2024-06-04 NOTE — Sepsis Progress Note (Signed)
 Elink following code sepsis

## 2024-06-04 NOTE — ED Provider Notes (Signed)
  Provider Note MRN:  [id number]  Arrival date & time: 06/04/24    ED Course and Medical Decision Making  Assumed care from Dr Theadore at shift change.  See note from prior team for complete details, in brief:  Clinical Course as of 06/04/24 1513  Mon Jun 04, 2024  0705 Handoff MB 68 yo/f hx seizure Clemens out of bed/ altered. Daughter gave her xanax  Bed bugs Presumed UTI [SG]  1000 Hypoxia noted to 80%, placed on 4LNC w/ improvement [SG]    Clinical Course User Index [SG] Elnor Jayson LABOR, DO   Patient hypothermic, she was postictal on arrival.  She has leukocytosis, possibly demargination from seizure versus infectious etiology.  She is tachycardic, she is HDS, temperature has normalized with Bair hugger.  She is hypoxic, does not usually wear home oxygen but is requiring nasal cannula here in the ER.  Mental status seems to have improved. There was report of seizure PTA, no seizure while in ED. Send AED levels, give keppra , continue seizure prec. No overt evidence of infection on imaging or exam.  Given new oxygen requirement will recommend patient for admission. EMS is making APS report as apparently pt was removed from SNF by daughter and has been living at hotel with daughter in apparent squalor per EMS.   Admit hospitalist        .Critical Care  Performed by: Elnor Jayson LABOR, DO Authorized by: Elnor Jayson LABOR, DO   Critical care provider statement:    Critical care time (minutes):  40   Critical care time was exclusive of:  Separately billable procedures and treating other patients   Critical care was necessary to treat or prevent imminent or life-threatening deterioration of the following conditions:  Respiratory failure   Critical care was time spent personally by me on the following activities:  Development of treatment plan with patient or surrogate, discussions with consultants, evaluation of patient's response to treatment, examination of patient, ordering and review of  laboratory studies, ordering and review of radiographic studies, ordering and performing treatments and interventions, pulse oximetry, re-evaluation of patient's condition, review of old charts and obtaining history from patient or surrogate   Care discussed with: admitting provider     Final Clinical Impressions(s) / ED Diagnoses     ICD-10-CM   1. Hypoxia  R09.02     2. Seizure (HCC)  R56.9     3. Altered mental status, unspecified altered mental status type  R41.82       ED Discharge Orders     None       Discharge Instructions   None        Elnor Jayson LABOR, DO 06/04/24 1513

## 2024-06-04 NOTE — ED Notes (Signed)
 Pt I/O cath'ed with Devin NT

## 2024-06-05 DIAGNOSIS — J9811 Atelectasis: Secondary | ICD-10-CM

## 2024-06-05 DIAGNOSIS — E8809 Other disorders of plasma-protein metabolism, not elsewhere classified: Secondary | ICD-10-CM

## 2024-06-05 DIAGNOSIS — I10 Essential (primary) hypertension: Secondary | ICD-10-CM

## 2024-06-05 DIAGNOSIS — R4182 Altered mental status, unspecified: Secondary | ICD-10-CM | POA: Diagnosis present

## 2024-06-05 DIAGNOSIS — E785 Hyperlipidemia, unspecified: Secondary | ICD-10-CM | POA: Diagnosis present

## 2024-06-05 DIAGNOSIS — W06XXXA Fall from bed, initial encounter: Secondary | ICD-10-CM | POA: Diagnosis present

## 2024-06-05 DIAGNOSIS — Z87891 Personal history of nicotine dependence: Secondary | ICD-10-CM | POA: Diagnosis not present

## 2024-06-05 DIAGNOSIS — F0284 Dementia in other diseases classified elsewhere, unspecified severity, with anxiety: Secondary | ICD-10-CM | POA: Diagnosis present

## 2024-06-05 DIAGNOSIS — Z8659 Personal history of other mental and behavioral disorders: Secondary | ICD-10-CM

## 2024-06-05 DIAGNOSIS — G40909 Epilepsy, unspecified, not intractable, without status epilepticus: Secondary | ICD-10-CM

## 2024-06-05 DIAGNOSIS — G9340 Encephalopathy, unspecified: Secondary | ICD-10-CM | POA: Diagnosis not present

## 2024-06-05 DIAGNOSIS — R000 Tachycardia, unspecified: Secondary | ICD-10-CM | POA: Diagnosis present

## 2024-06-05 DIAGNOSIS — K219 Gastro-esophageal reflux disease without esophagitis: Secondary | ICD-10-CM | POA: Diagnosis present

## 2024-06-05 DIAGNOSIS — T50915A Adverse effect of multiple unspecified drugs, medicaments and biological substances, initial encounter: Secondary | ICD-10-CM | POA: Diagnosis present

## 2024-06-05 DIAGNOSIS — D72829 Elevated white blood cell count, unspecified: Secondary | ICD-10-CM | POA: Diagnosis present

## 2024-06-05 DIAGNOSIS — M6282 Rhabdomyolysis: Secondary | ICD-10-CM | POA: Diagnosis not present

## 2024-06-05 DIAGNOSIS — F439 Reaction to severe stress, unspecified: Secondary | ICD-10-CM | POA: Diagnosis present

## 2024-06-05 DIAGNOSIS — Z833 Family history of diabetes mellitus: Secondary | ICD-10-CM | POA: Diagnosis not present

## 2024-06-05 DIAGNOSIS — G309 Alzheimer's disease, unspecified: Secondary | ICD-10-CM | POA: Diagnosis present

## 2024-06-05 DIAGNOSIS — J45909 Unspecified asthma, uncomplicated: Secondary | ICD-10-CM | POA: Diagnosis present

## 2024-06-05 DIAGNOSIS — T424X5A Adverse effect of benzodiazepines, initial encounter: Secondary | ICD-10-CM | POA: Diagnosis present

## 2024-06-05 DIAGNOSIS — J9601 Acute respiratory failure with hypoxia: Secondary | ICD-10-CM | POA: Diagnosis present

## 2024-06-05 DIAGNOSIS — Z5901 Sheltered homelessness: Secondary | ICD-10-CM | POA: Diagnosis not present

## 2024-06-05 DIAGNOSIS — F1010 Alcohol abuse, uncomplicated: Secondary | ICD-10-CM | POA: Diagnosis present

## 2024-06-05 DIAGNOSIS — R079 Chest pain, unspecified: Secondary | ICD-10-CM

## 2024-06-05 DIAGNOSIS — Z8249 Family history of ischemic heart disease and other diseases of the circulatory system: Secondary | ICD-10-CM | POA: Diagnosis not present

## 2024-06-05 DIAGNOSIS — Z79899 Other long term (current) drug therapy: Secondary | ICD-10-CM

## 2024-06-05 DIAGNOSIS — R680 Hypothermia, not associated with low environmental temperature: Secondary | ICD-10-CM | POA: Diagnosis present

## 2024-06-05 DIAGNOSIS — F02811 Dementia in other diseases classified elsewhere, unspecified severity, with agitation: Secondary | ICD-10-CM | POA: Diagnosis present

## 2024-06-05 DIAGNOSIS — G928 Other toxic encephalopathy: Secondary | ICD-10-CM | POA: Diagnosis present

## 2024-06-05 LAB — LEVETIRACETAM LEVEL: Levetiracetam Lvl: 3 ug/mL — ABNORMAL LOW (ref 10.0–40.0)

## 2024-06-05 LAB — CK: Total CK: 792 U/L — ABNORMAL HIGH (ref 38–234)

## 2024-06-05 LAB — CBC
HCT: 31.3 % — ABNORMAL LOW (ref 36.0–46.0)
Hemoglobin: 10.2 g/dL — ABNORMAL LOW (ref 12.0–15.0)
MCH: 29.1 pg (ref 26.0–34.0)
MCHC: 32.6 g/dL (ref 30.0–36.0)
MCV: 89.4 fL (ref 80.0–100.0)
Platelets: 324 K/uL (ref 150–400)
RBC: 3.5 MIL/uL — ABNORMAL LOW (ref 3.87–5.11)
RDW: 13.8 % (ref 11.5–15.5)
WBC: 8.7 K/uL (ref 4.0–10.5)
nRBC: 0 % (ref 0.0–0.2)

## 2024-06-05 LAB — BASIC METABOLIC PANEL WITH GFR
Anion gap: 10 (ref 5–15)
BUN: 13 mg/dL (ref 8–23)
CO2: 29 mmol/L (ref 22–32)
Calcium: 8.9 mg/dL (ref 8.9–10.3)
Chloride: 102 mmol/L (ref 98–111)
Creatinine, Ser: 0.66 mg/dL (ref 0.44–1.00)
GFR, Estimated: >60 mL/min (ref >60)
Glucose, Bld: 89 mg/dL (ref 70–99)
Potassium: 3.6 mmol/L (ref 3.5–5.1)
Sodium: 141 mmol/L (ref 135–145)

## 2024-06-05 MED ORDER — DICLOFENAC SODIUM 1 % EX GEL
4.0000 g | Freq: Two times a day (BID) | CUTANEOUS | Status: DC
  Administered 2024-06-05 – 2024-06-06 (×3): 4 g via TOPICAL
  Filled 2024-06-05: qty 100

## 2024-06-05 MED ORDER — BISACODYL 10 MG RE SUPP
10.0000 mg | Freq: Every day | RECTAL | Status: DC | PRN
  Filled 2024-06-05: qty 1

## 2024-06-05 MED ORDER — LIDOCAINE 5 % EX PTCH
1.0000 | MEDICATED_PATCH | CUTANEOUS | Status: DC
  Administered 2024-06-05 – 2024-06-06 (×2): 1 via TRANSDERMAL
  Filled 2024-06-05 (×2): qty 1

## 2024-06-05 NOTE — Progress Notes (Signed)
 HD#0 SUBJECTIVE:  Patient Summary: Lindsey [name] is a 68 y.o. with a pertinent PMH of seizure disorder, asthma, hypertension, and GERD who presented with AMS + witnessed fall and admitted for acute encephalopathy.   Overnight Events: Complaining of some back pain early this morning.  Interim History: Patient complaining of back pain this morning.  Does not feel as confused as when she first came into the hospital.  OBJECTIVE:  Vital Signs: Vitals:   06/05/24 0627 06/05/24 0819 06/05/24 0916 06/05/24 1141  BP:  (!) 101/53  127/65  Pulse: 82 76 75 66  Resp:  18 17 18   Temp:  98.6 F (37 C)  98.5 F (36.9 C)  TempSrc:  Oral  Oral  SpO2: 100% 98% 99%     There were no vitals filed for this visit.   Intake/Output Summary (Last 24 hours) at 06/05/2024 1319 Last data filed at 06/05/2024 0500 Gross per 24 hour  Intake 1091.41 ml  Output 300 ml  Net 791.41 ml   Net IO Since Admission: 791.41 mL [06/05/24 1319]  Physical Exam: Constitutional: well-appearing, well-nourished, in no acute distress HENT: Hypopigmented scar tissue with continuous patches of missing hair on head, mucous membranes moist Eyes: conjunctiva non-erythematous, PERRL, no scleral icterus Neck: supple without lesions, thyroid non-enlarged and non-tender Cardiovascular: regular rate and rhythm, no m/r/g Pulmonary/Chest: normal work of breathing on room air, lungs clear to auscultation bilaterally Abdominal: soft, non-tender, non-distended, bowel sounds normal MSK: normal bulk and tone Neurological: Alert and oriented to person, place, date with reorientation; follows commands, is able to tell for the president is; moving extremities equally Skin: warm and dry Extremities: Mild bilateral lower extremity pitting edema without cyanosis; peripheral pulses intact Psych: normal mood and affect, thought content normal  Patient Lines/Drains/Airways Status     Active Line/Drains/Airways     Name Placement date  Placement time Site Days   Peripheral IV 06/04/24 20 G 1.88 Anterior;Left;Upper Arm 06/04/24  0626  Arm  1   External Urinary Catheter 06/04/24  2000  --  1   Wound Other (Comment) Thigh Anterior;Right;Left --  --  Thigh  --   Wound 03/12/24 1852 Other (Comment) Sacrum Medial 03/12/24  1852  Sacrum  85            Pertinent labs and imaging:     Latest Ref Rng & Units 06/05/2024    4:33 AM 06/04/2024    6:29 AM 05/16/2024    3:55 PM  CBC  WBC 4.0 - 10.5 K/uL 8.7  15.0  9.0   Hemoglobin 12.0 - 15.0 g/dL 89.7  87.0  88.3   Hematocrit 36.0 - 46.0 % 31.3  39.7  38.2   Platelets 150 - 400 K/uL 324  435  382        Latest Ref Rng & Units 06/05/2024    4:33 AM 06/04/2024    6:29 AM 05/16/2024    3:55 PM  CMP  Glucose 70 - 99 mg/dL 89  91  81   BUN 8 - 23 mg/dL 13  15  21    Creatinine 0.44 - 1.00 mg/dL 9.33  9.28  9.18   Sodium 135 - 145 mmol/L 141  138  139   Potassium 3.5 - 5.1 mmol/L 3.6  3.5  4.6   Chloride 98 - 111 mmol/L 102  97  99   CO2 22 - 32 mmol/L 29  31  28    Calcium  8.9 - 10.3 mg/dL 8.9  9.7  9.5   Total Protein 6.5 - 8.1 g/dL  7.6  8.7   Total Bilirubin 0.0 - 1.2 mg/dL  0.6  0.3   Alkaline Phos 38 - 126 U/L  96  135   AST 15 - 41 U/L  37  27   ALT 0 - 44 U/L  13  5     CT CHEST ABDOMEN PELVIS WO CONTRAST Result Date: 06/04/2024 CLINICAL DATA:  Sepsis EXAM: CT CHEST, ABDOMEN AND PELVIS WITHOUT CONTRAST TECHNIQUE: Multidetector CT imaging of the chest, abdomen and pelvis was performed following the standard protocol without IV contrast. RADIATION DOSE REDUCTION: This exam was performed according to the departmental dose-optimization program which includes automated exposure control, adjustment of the mA and/or kV according to patient size and/or use of iterative reconstruction technique. COMPARISON:  05/10/2023 and 02/02/2024 CT scans FINDINGS: CT CHEST FINDINGS Cardiovascular: Coronary, aortic arch, and branch vessel atherosclerotic vascular disease. Mediastinum/Nodes: No  significant findings Lungs/Pleura: Paraseptal emphysema. Volume loss and bandlike atelectasis in the right lower lobe with airway plugging in both lower lobes. Lesser peripheral atelectasis posteriorly in the lingula posterior basal segment left lower lobe. Stable 3 mm right upper lobe nodule on image 32 series 5. Medial right upper lobe nodule on image 38 series 5 measures 8 by 6 by 4 mm (volume = 100 mm^3) with somewhat ill-defined margins. This was deemed a suspicious nodule on 02/02/2024 ; the appearance is relatively stable on today's four-month follow up. Musculoskeletal: Mild thoracic spondylosis.  Thoracic kyphosis. CT ABDOMEN PELVIS FINDINGS Hepatobiliary: Cholecystectomy. Pancreas: Unremarkable Spleen: Unremarkable Adrenals/Urinary Tract: Renal cysts as on prior exams. No further imaging workup of these lesions is indicated. Adrenal glands unremarkable. Stomach/Bowel: Prominent stool throughout the colon favors constipation. Vascular/Lymphatic: Atherosclerosis is present, including aortoiliac atherosclerotic disease. Reproductive: Uterus absent.  Adnexa unremarkable. Other: No supplemental non-categorized findings. Musculoskeletal: There is likely moderate bilateral foraminal impingement at L5-S1 due to intervertebral and facet spurring. Moderate degenerative hip arthropathy on the left. IMPRESSION: 1. A specific cause for the patient's sepsis is not identified. 2. Volume loss and bandlike atelectasis in the right lower lobe with airway plugging in both lower lobes. Lesser peripheral atelectasis posteriorly in the lingula and posterior basal segment left lower lobe. 3. Stable 100 cubic mm (mean diameter 7 mm) medial right upper lobe nodule with somewhat ill-defined margins. This was deemed a suspicious nodule on 02/02/2024 ; the appearance is relatively stable on today's four-month follow up. I recommend either close CT surveillance in 3-6 months time, or PET-CT for further characterization. 4. Prominent stool  throughout the colon favors constipation. 5. Moderate bilateral foraminal impingement at L5-S1 due to intervertebral and facet spurring. 6. Moderate degenerative hip arthropathy on the left. 7. Aortic Atherosclerosis (ICD10-I70.0) and Emphysema (ICD10-J43.9). Electronically Signed   By: Ryan Salvage M.D.   On: 06/04/2024 14:05    ASSESSMENT/PLAN:  Assessment: Active Problems:   Altered mental status   Plan: #Acute encephalopathy #?Alzheimer's Dementia #Seizure disorder #Fall Patient's mental status has improved since yesterday.  She appears more oriented. Valproic  acid level is low, may be etiology for a breakthrough seizure/fall.  UDS positive for benzodiazepines which is consistent with reported history.  Encephalopathy and fall are likely multifactorial in nature including use of benzodiazepines, breakthrough seizure from medication non-adherence, and acute worsening of Alzheimer's. Elevated CK indicating the patient may have been down on the ground for a long period of time, this has been downtrending.  TSH and vitamin B12 within normal limits.  We will  hold her centrally acting medications. Will have PT and OT evaluate and work with her during her admission. - Cardiac telemetry - Keppra  level pending - Start home Depakote  ER 750 mg twice daily - Start home Keppra  1000 mg daily (received 1500 mg IV injection in the ED) - Pain regimen: - Acetaminophen  1000 mg every 6 hours for mild/moderate pain - Hold home escitalopram, gabapentin , tramadol  - PT/OT evaluation appreciated       #Chest Pain Patient reporting sharp, knife-like chest pain in the ED.  Unable to give clear history due to mental status.  EKG without T wave/ST changes.  In the setting of recent fall, this is most likely musculoskeletal in nature.  However, since the patient is unable to provide good history we will order troponins to follow. - Troponin 16-> - Telemetry as above    #Tachycardia #Leukocytosis #Atelectasis Patient is without shortness of breath lungs remain CTAB.  CT imaging showing right lower lobe atelectasis and airway plugging in both lower lobes.  Initial concern for sepsis with leukocytosis, hypothermia, tachycardia.  Leukocytosis is probably due to stress response in the setting of acute fall.  This is now resolved. Her previous symptom is shortness of breath are likely in the setting of atelectasis due to positional compression of the lungs.  Although WBCs were previously elevated, lower suspicion for pulmonary infection due to lack of CT imaging findings or symptoms.  Initially considered PE but tachycardia has improved improved without intervention and she has been weaned to room air.  Low suspicion for sepsis as mentioned previously but we will follow-up blood cultures. - Blood cultures x2 NGTD - Discontinue maintenance fluids  - Flutter valve - Incentive spirometry   #Asthma Not currently wheezing and lungs are CTAB, however reports increased inhaler use at home.  Low concern for asthma exacerbation at this time, will continue home inhaler and monitor. - Breo Ellipta  inhaler daily - Albuterol  nebulizer solution every 4 hours as needed   #Substance Use Disorder #Alcohol Use Disorder Patient has reported history of cocaine and alcohol use greater than 10 years ago.  CIWA scores have been 0 since admission.  Will not use Ativan  as her reason for admission may be in part due to benzodiazepine use. - CIWA protocol without Ativan  - Thiamine  supplementation - Folate 1 mg   #Hypoalbuminemia Albumin decreased to 3.2 on admission.  Likely in the setting of poor p.o. intake given history of housing insecurity.  Will give her protein supplementation. - Ensure with high-protein supplementation - Daily multivitamin   #Hypertension BP normotensive on arrival, will continue home medications and monitor. - Continue home amlodipine  5 mg daily - Continue  home hydrochlorothiazide  25 mg daily   #Hyperlipidemia No lipid panel seen during chart review.  Consider outpatient follow-up. - Continue Crestor  5 mg daily  Best Practice: Diet: Soft IVF: Fluids: none, Rate: None VTE: enoxaparin  (LOVENOX ) injection 40 mg Start: 06/04/24 1600 Code: Full  Disposition planning: Therapy Recs: SNF, DME: none Family Contact: Thurnell Gavel (daughter), called and notified. DISPO: Anticipated discharge tomorrow to Skilled nursing facility pending insurance authorization.  Signature:  Letha Cheadle, MD [facility]  PGY-1 06/05/2024, 1:19 PM  On Call pager [phone]

## 2024-06-05 NOTE — Care Management Obs Status (Signed)
 MEDICARE OBSERVATION STATUS NOTIFICATION   Patient Details  Name: Lindsey [name] MRN: [id number] Date of Birth: [birth year]   Medicare Observation Status Notification Given:     Verbally reviewed observation notice with [name] telephonically at [phone]. A copy of the notice will be delivered to patient room.   [name] 06/05/2024, 9:14 AM
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
## 2024-06-05 NOTE — Discharge Instructions (Signed)
 To [name] or their caretakers,  You were recently admitted to [facility] for altered mental status.  We think this is due to a number of causes including benzodiazepine use (Xanax ), acute worsening of Alzheimer's dementia, not taking antiepileptic medications.  Please continue taking your Depakote  and Keppra  as prescribed.  It is unclear if you had a breakthrough seizure, but we are glad that you are feeling better and back to baseline.  Try to avoid benzodiazepine use including medications like Xanax , Valium .  Continue taking your home medications with the following changes:  Start taking Acetaminophen  (Tylenol ) 500 mg - take 2 tablets by mouth every 6 hours as needed for mild pain Bisacodyl  (Dulcolax) 10 mg suppository daily as needed for constipation Folic acid  (Folvite ) 1 mg daily Lidocaine  (Lidoderm ) 5% patch daily Multivitamin Thiamine  (vitamin B1) 100 mg daily Stop taking Alprazolam  (Xanax ) 1 mg Tramadol  (Ultram ) 50 mg Continue taking Albuterol  (Ventolin  HFA) inhaler Amlodipine  (Norvasc ) 5 mg daily Fluticasone  furoate-vilanterol (Breo Ellipta ) 100-25 Cyclobenzaprine  (Flexeril ) 10 mg daily as needed for muscle spasms Diclofenac  sodium (Voltaren ) 1% gel Divalproex  (Depakote  ER) 500 mg 24-hour tablet daily Escitalopram (Lexapro) 5 mg at bedtime Ferrous sulfate 325 (65 Fe) milligrams daily Gabapentin  (Neurontin ) 100 mg 3 times daily Hydrochlorothiazide  (HydroDIURIL ) 25 daily Naloxone HCl (Kloxxado) 8 mg / 0.1 mL liquid Lactulose  (Chronulac ) 10 GM/15 mL Levetiracetam  (Keppra ) 1000 mg in the morning and at bedtime Loratadine  (Claritin ) 10 mg at bedtime Mirtazapine  (Remeron ) 7.5 mg at bedtime Pantoprazole  (Protonix ) 40 mg daily Rosuvastatin  (Crestor ) 5 mg at bedtime   You should seek further medical care if you experience worsening altered mental status, fevers, chest pain, shortness of breath.  We recommend that you also see your primary care doctor in about a  week to make sure that you continue to improve. We are so glad that you are feeling better.  Sincerely,  [facility]

## 2024-06-05 NOTE — Discharge Summary (Incomplete)
 Name: Lindsey [name] MRN: [id number] DOB: [birth year] 68 y.o. PCP: [facility] [address]  Date of Admission: 06/04/2024  5:49 AM Date of Discharge: 06/05/2024  Attending Physician: Dr. 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 DAUGHTER SAID THIS IS TOLERATED IN 2025.   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  At the hospital follow up visit please address:  1.  Follow-up:  a.  Acute encephalopathy - please reassess use of centrally acting medications including tramadol , gabapentin , benzodiazepines.  The patient takes Xanax  for anxiety, however, this may contribute to worsening agitation and the patient with Alzheimer's disease.  Additionally, the patient has a history of seizure disorder.  Unclear if this patient had a breakthrough seizure.  Please ensure the patient is taking her Keppra  and Depakote .    b.  Mechanical fall in the elderly - please ensure the patient has adequate physical  therapy work as she has had a recent decrease in mobility.  This may have contributed to her fall as well.   c.  Chest/back pain - in the setting of acute fall, the patient was endorsing chest and back pain.  She may need a good pain regimen for this.  During her hospitalization she received a lidocaine  patch for her chest pain.  Troponins were negative, this is likely MSK related.  CTs were negative for acute fracture.   d. Rhabdomyolysis - after fall, the patient was found down until she was transported to hospital by EMS.  CK was significantly elevated to 1400s.  May be clinically indicated to order BMP with CK for further resolution of rhabdomyolysis.  She was symptomatic from this.  2.  Labs / imaging needed at time of follow-up: BMP, CK  3.  Pending labs/ test needing follow-up: none   Follow-up Appointments:  Contact information for after-discharge care     Destination     [facility]   Service: Skilled Nursing Contact information: [address] [place]  [address] [phone]                     Hospital Course by problem list: #Acute encephalopathy #?Alzheimer's Dementia #Seizure disorder #Fall Patient presented acute worsening of mental status after witnessed fall.  Patient has a history of Alzheimer's disease, seizure disorder, and was given Xanax  a few hours prior to her fall.  During her hospitalization, she was found to have a low valproic  acid level which may have indicated a breakthrough seizure.  UDS was positive for benzodiazepine  which is consistent with her reported history.  Her fall was likely multifactorial in nature including benzodiazepine use, breakthrough seizure from medication non-adherence, and acute worsening of chronic dementia.  CT studies were negative for fractures or acute intracranial pathologies.  CT did show stable appearance of chronic white matter disease, most pronounced in the left corona  radiata.  Other findings were normal TSH, vitamin B12 level.  During her hospitalization, she received 1 dose of ceftriaxone  and fluid bolus with maintenance fluids.  During her hospitalization, we kept the patient on cardiac telemetry and restarted her home antiepileptics while holding centrally acting medications.  On hospital day 1, the patient had significant improvement to her mental status.  PT and OT evaluated her and recommended further rehabilitation at a skilled nursing facility.   #Chest Pain Upon presentation the patient was reporting sharp, knife-like chest pain.  She was unable to give a clear history due to her mental status.  EKG was without ST or T wave changes.  Troponin was not within normal limits.  Given her fall, it is likely that her pain was musculoskeletal in nature.  We were not concerned for ACS.   #Atelectasis The patient initially presented to the hospital and was on 3-4 L of oxygen with using nasal cannula.  Upon initial evaluation by the internal medicine, we were able to wean her off onto room air without any desaturations or subjective shortness of breath.  She was initially hypothermic via rectal temperature, tachycardic, and had leukocytosis prompting a sepsis workup.  Blood cultures were negative and her leukocytosis improved without any further interventions besides 1 dose of ceftriaxone .  Leukocytosis was likely in the setting of stress response to fall.  CT imaging did show right lower lobe atelectasis with airway plugging and bilateral lower lobes.  She was given incentive spirometry and flutter valve.  #Rhabdomyolysis Initial CK of in the 1400s which downtrended to 700s.  Patient was endorsing some pain but this was likely in the setting of fall rather than muscle cramps induced by rhabdomyolysis.  This is concordant with reported history of patient's fall and caregiver (daughter) unable to pick her up.  #Asthma We did not have any acute concerns for her asthma  during this admission.  She was not wheezing and had clear lungs upon auscultation.  No concerns for any asthma exacerbation.  We continued her home medications.   #Substance Use Disorder #Alcohol Use Disorder During this admission we kept the patient on CIWA protocol without Ativan  given her significant history of cocaine and alcohol use.  Ethanol level was normal upon admission.  CIWA scores did not increase from 0.  We also started her on thiamine  and folate supplementation out of abundance of caution.  #Hypoalbuminemia On admission albumin was decreased to 3.2.  This is likely in the setting of poor p.o. intake given history of housing insecurity and potential food insecurity.  We ordered Ensure protein drinks for her while she was in the hospital.   #Hypertension BP normotensive on arrival, we continued home medications and did not have any acute concerns.   #Hyperlipidemia No lipid panel seen during chart review.  We continued her home Crestor .  Consider outpatient follow-up.   Discharge Subjective: [name] reports feeling much better than when she came in.  She feels less confused.  She is okay with plans to go to skilled nursing facility for further rehabilitation. Patient is medically ready for discharge.  Discharge Exam:   BP 132/74 (BP Location: Right  Arm)   Pulse 90   Temp 98.1 F (36.7 C) (Oral)   Resp 18   SpO2 99%  Physical Exam: Constitutional: well-appearing, well-nourished, in no acute distress HENT: Hypopigmented scar tissue with continuous patches of missing hair on head, mucous membranes moist Eyes: conjunctiva non-erythematous, PERRL, no scleral icterus Neck: supple without lesions, thyroid non-enlarged and non-tender Cardiovascular: regular rate and rhythm, no m/r/g Pulmonary/Chest: normal work of breathing on room air, lungs clear to auscultation bilaterally Abdominal: soft, non-tender, non-distended, bowel sounds normal MSK: normal bulk and tone;  mild TTP over lumbar spine, no palpable step-offs Neurological: Alert and oriented to person, place, date with reorientation; follows commands, is able to tell for the president is; moving extremities equally Skin: warm and dry Extremities: Mild bilateral lower extremity pitting edema without cyanosis; peripheral pulses intact Psych: normal mood and affect, thought content normal   Pertinent Labs, Studies, and Procedures:     Latest Ref Rng & Units 06/05/2024    4:33 AM 06/04/2024    6:29 AM 05/16/2024    3:55 PM  CBC  WBC 4.0 - 10.5 K/uL 8.7  15.0  9.0   Hemoglobin 12.0 - 15.0 g/dL 89.7  87.0  88.3   Hematocrit 36.0 - 46.0 % 31.3  39.7  38.2   Platelets 150 - 400 K/uL 324  435  382        Latest Ref Rng & Units 06/05/2024    4:33 AM 06/04/2024    6:29 AM 05/16/2024    3:55 PM  CMP  Glucose 70 - 99 mg/dL 89  91  81   BUN 8 - 23 mg/dL 13  15  21    Creatinine 0.44 - 1.00 mg/dL 9.33  9.28  9.18   Sodium 135 - 145 mmol/L 141  138  139   Potassium 3.5 - 5.1 mmol/L 3.6  3.5  4.6   Chloride 98 - 111 mmol/L 102  97  99   CO2 22 - 32 mmol/L 29  31  28    Calcium  8.9 - 10.3 mg/dL 8.9  9.7  9.5   Total Protein 6.5 - 8.1 g/dL  7.6  8.7   Total Bilirubin 0.0 - 1.2 mg/dL  0.6  0.3   Alkaline Phos 38 - 126 U/L  96  135   AST 15 - 41 U/L  37  27   ALT 0 - 44 U/L  13  5     CT CHEST ABDOMEN PELVIS WO CONTRAST Result Date: 06/04/2024 CLINICAL DATA:  Sepsis EXAM: CT CHEST, ABDOMEN AND PELVIS WITHOUT CONTRAST TECHNIQUE: Multidetector CT imaging of the chest, abdomen and pelvis was performed following the standard protocol without IV contrast. RADIATION DOSE REDUCTION: This exam was performed according to the departmental dose-optimization program which includes automated exposure control, adjustment of the mA and/or kV according to patient size and/or use of iterative reconstruction technique. COMPARISON:  05/10/2023 and 02/02/2024 CT scans FINDINGS: CT CHEST FINDINGS Cardiovascular: Coronary, aortic arch,  and branch vessel atherosclerotic vascular disease. Mediastinum/Nodes: No significant findings Lungs/Pleura: Paraseptal emphysema. Volume loss and bandlike atelectasis in the right lower lobe with airway plugging in both lower lobes. Lesser peripheral atelectasis posteriorly in the lingula posterior basal segment left lower lobe. Stable 3 mm right upper lobe nodule on image 32 series 5. Medial right upper lobe nodule on image 38 series 5 measures 8 by 6 by 4 mm (volume = 100 mm^3) with somewhat ill-defined margins. This was deemed a suspicious nodule on 02/02/2024 ;  the appearance is relatively stable on today's four-month follow up. Musculoskeletal: Mild thoracic spondylosis.  Thoracic kyphosis. CT ABDOMEN PELVIS FINDINGS Hepatobiliary: Cholecystectomy. Pancreas: Unremarkable Spleen: Unremarkable Adrenals/Urinary Tract: Renal cysts as on prior exams. No further imaging workup of these lesions is indicated. Adrenal glands unremarkable. Stomach/Bowel: Prominent stool throughout the colon favors constipation. Vascular/Lymphatic: Atherosclerosis is present, including aortoiliac atherosclerotic disease. Reproductive: Uterus absent.  Adnexa unremarkable. Other: No supplemental non-categorized findings. Musculoskeletal: There is likely moderate bilateral foraminal impingement at L5-S1 due to intervertebral and facet spurring. Moderate degenerative hip arthropathy on the left. IMPRESSION: 1. A specific cause for the patient's sepsis is not identified. 2. Volume loss and bandlike atelectasis in the right lower lobe with airway plugging in both lower lobes. Lesser peripheral atelectasis posteriorly in the lingula and posterior basal segment left lower lobe. 3. Stable 100 cubic mm (mean diameter 7 mm) medial right upper lobe nodule with somewhat ill-defined margins. This was deemed a suspicious nodule on 02/02/2024 ; the appearance is relatively stable on today's four-month follow up. I recommend either close CT surveillance in  3-6 months time, or PET-CT for further characterization. 4. Prominent stool throughout the colon favors constipation. 5. Moderate bilateral foraminal impingement at L5-S1 due to intervertebral and facet spurring. 6. Moderate degenerative hip arthropathy on the left. 7. Aortic Atherosclerosis (ICD10-I70.0) and Emphysema (ICD10-J43.9). Electronically Signed   By: Ryan Salvage M.D.   On: 06/04/2024 14:05   DG Chest Port 1 View Result Date: 06/04/2024 CLINICAL DATA:  68 year old female found down next to bed. Altered mental status. Possible sepsis. EXAM: PORTABLE CHEST 1 VIEW COMPARISON:  Portable chest 05/11/2024 and earlier. FINDINGS: Portable AP semi upright view at 0713 hours. Mildly improved lung volumes from last month. Calcified aortic atherosclerosis. Normal cardiac size and mediastinal contours. Visualized tracheal air column is within normal limits. Lung markings appear stable from last year. No pneumothorax, pulmonary edema, pleural effusion or confluent lung opacity. No acute osseous abnormality identified. Paucity of bowel gas the visible abdomen. IMPRESSION: No acute cardiopulmonary abnormality. Electronically Signed   By: VEAR Hurst M.D.   On: 06/04/2024 07:45   CT CERVICAL SPINE WO CONTRAST Result Date: 06/04/2024 CLINICAL DATA:  68 year old female found down next to bed. Altered mental status. EXAM: CT CERVICAL SPINE WITHOUT CONTRAST TECHNIQUE: Multidetector CT imaging of the cervical spine was performed without intravenous contrast. Multiplanar CT image reconstructions were also generated. RADIATION DOSE REDUCTION: This exam was performed according to the departmental dose-optimization program which includes automated exposure control, adjustment of the mA and/or kV according to patient size and/or use of iterative reconstruction technique. COMPARISON:  Head CT today. Cervical spine CT 05/11/2024 and earlier. FINDINGS: Alignment: Increased cervical lordosis. Mildly increased degenerative  appearing retrolisthesis of C4 on C5, C7 on T1. Alignment similar to 02/19/2024 comparison. Maintained bilateral posterior element alignment. Skull base and vertebrae: Chronic hyperostosis. Bone mineralization remains within normal limits. Visualized skull base is intact. No atlanto-occipital dissociation. C1 and C2 appear intact, aligned. Evidence of chronic traumatic injury at C5-C6 with bulky chronic heterotopic ossification there (series 3, image 49), unchanged. No acute osseous abnormality identified. Soft tissues and spinal canal: No prevertebral fluid or swelling. No visible canal hematoma. Bulky chronic heterotopic ossification at the right C5-C6 level extending into the spinal canal is unchanged (series 3, image 49). Disc levels: Stable. Outside of chronic changes on the right at C5-C6, disc and endplate degeneration is maximal at C4-C5. Upper chest: Visible upper thoracic levels appear intact. Motion artifact at the noncontrast  thoracic inlet which otherwise appears negative. IMPRESSION: 1. No acute traumatic injury identified in the cervical spine. 2. 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 Electronically Signed   By: VEAR Hurst M.D.   On: 06/04/2024 07:13   CT HEAD WO CONTRAST ( ) Result Date: 06/04/2024 CLINICAL DATA:  68 year old female found down next to bed. Altered mental status. EXAM: CT HEAD WITHOUT CONTRAST TECHNIQUE: Contiguous axial images were obtained from the base of the skull through the vertex without intravenous contrast. RADIATION DOSE REDUCTION: This exam was performed according to the departmental dose-optimization program which includes automated exposure control, adjustment of the mA and/or kV according to patient size and/or use of iterative reconstruction technique. COMPARISON:  Head CT 05/11/2024 and earlier. FINDINGS: Brain: Stable cerebral volume. No midline shift, ventriculomegaly,  mass effect, evidence of mass lesion, intracranial hemorrhage or evidence of cortically based acute infarction. Patchy mild to moderate for age periventricular white matter hypodensity, most pronounced in the left corona radiata and stable. Vascular: Calcified atherosclerosis at the skull base. No suspicious intracranial vascular hyperdensity. Skull: Stable, intact.  No acute osseous abnormality identified. Sinuses/Orbits: Visualized paranasal sinuses and mastoids are stable and well aerated. Left sphenoid sinus bubbly opacity, regressed from last month. Other: No acute orbit or scalp soft tissue finding identified. IMPRESSION: 1. No acute intracranial abnormality or acute traumatic injury identified. 2. Stable non contrast CT appearance of chronic white matter disease, most pronounced in the left corona radiata. Electronically Signed   By: VEAR Hurst M.D.   On: 06/04/2024 07:09     Discharge Instructions:   Discharge Instructions      To Lindsey [name] or their caretakers,  You were recently admitted to [facility] for altered mental status.  We think this is due to a number of causes including benzodiazepine use (Xanax ), acute worsening of Alzheimer's dementia, not taking antiepileptic medications.  Please continue taking your Depakote  and Keppra  as prescribed.  It is unclear if you had a breakthrough seizure, but we are glad that you are feeling better and back to baseline.  Try to avoid benzodiazepine use including medications like Xanax , Valium .  Continue taking your home medications with the following changes:  Start taking Acetaminophen  (Tylenol ) 500 mg - take 2 tablets by mouth every 6 hours as needed for mild pain Bisacodyl  (Dulcolax) 10 mg suppository daily as needed for constipation Folic acid  (Folvite ) 1 mg daily Lidocaine  (Lidoderm ) 5% patch daily Multivitamin Thiamine  (vitamin B1) 100 mg daily Stop taking Alprazolam  (Xanax ) 1 mg Tramadol  (Ultram ) 50 mg Continue  taking Albuterol  (Ventolin  HFA) inhaler Amlodipine  (Norvasc ) 5 mg daily Fluticasone  furoate-vilanterol (Breo Ellipta ) 100-25 Cyclobenzaprine  (Flexeril ) 10 mg daily as needed for muscle spasms Diclofenac  sodium (Voltaren ) 1% gel Divalproex  (Depakote  ER) 500 mg 24-hour tablet daily Escitalopram (Lexapro) 5 mg at bedtime Ferrous sulfate 325 (65 Fe) milligrams daily Gabapentin  (Neurontin ) 100 mg 3 times daily Hydrochlorothiazide  (HydroDIURIL ) 25 daily Naloxone HCl (Kloxxado) 8 mg / 0.1 mL liquid Lactulose  (Chronulac ) 10 GM/15 mL Levetiracetam  (Keppra ) 1000 mg in the morning and at bedtime Loratadine  (Claritin ) 10 mg at bedtime Mirtazapine  (Remeron ) 7.5 mg at bedtime Pantoprazole  (Protonix ) 40 mg daily Rosuvastatin  (Crestor ) 5 mg at bedtime   You should seek further medical care if you experience worsening altered mental status, fevers, chest pain, shortness of breath.  We recommend that you also see your primary care doctor in about a week to make sure that you continue  to improve. We are so glad that you are feeling better.  Sincerely,  [facility]      Signed:  Letha Cheadle, MD Internal Medicine Resident, PGY-1 06/05/2024, 4:56 PM Please contact the on call pager after 5 pm and on weekends at [phone].

## 2024-06-05 NOTE — Plan of Care (Signed)

## 2024-06-06 ENCOUNTER — Other Ambulatory Visit (HOSPITAL_COMMUNITY): Payer: Self-pay

## 2024-06-06 DIAGNOSIS — J45909 Unspecified asthma, uncomplicated: Secondary | ICD-10-CM | POA: Diagnosis not present

## 2024-06-06 DIAGNOSIS — M6282 Rhabdomyolysis: Secondary | ICD-10-CM

## 2024-06-06 DIAGNOSIS — Z9181 History of falling: Secondary | ICD-10-CM

## 2024-06-06 DIAGNOSIS — J9811 Atelectasis: Secondary | ICD-10-CM | POA: Diagnosis not present

## 2024-06-06 DIAGNOSIS — G9340 Encephalopathy, unspecified: Secondary | ICD-10-CM | POA: Diagnosis not present

## 2024-06-06 LAB — BASIC METABOLIC PANEL WITH GFR
Anion gap: 10 (ref 5–15)
BUN: 7 mg/dL — ABNORMAL LOW (ref 8–23)
CO2: 28 mmol/L (ref 22–32)
Calcium: 9 mg/dL (ref 8.9–10.3)
Chloride: 99 mmol/L (ref 98–111)
Creatinine, Ser: 0.69 mg/dL (ref 0.44–1.00)
GFR, Estimated: >60 mL/min (ref >60)
Glucose, Bld: 87 mg/dL (ref 70–99)
Potassium: 3.6 mmol/L (ref 3.5–5.1)
Sodium: 137 mmol/L (ref 135–145)

## 2024-06-06 LAB — CBC
HCT: 30.4 % — ABNORMAL LOW (ref 36.0–46.0)
Hemoglobin: 10 g/dL — ABNORMAL LOW (ref 12.0–15.0)
MCH: 28.7 pg (ref 26.0–34.0)
MCHC: 32.9 g/dL (ref 30.0–36.0)
MCV: 87.4 fL (ref 80.0–100.0)
Platelets: 345 K/uL (ref 150–400)
RBC: 3.48 MIL/uL — ABNORMAL LOW (ref 3.87–5.11)
RDW: 13.6 % (ref 11.5–15.5)
WBC: 6.1 K/uL (ref 4.0–10.5)
nRBC: 0 % (ref 0.0–0.2)

## 2024-06-06 MED ORDER — THIAMINE HCL 100 MG PO TABS
100.0000 mg | ORAL_TABLET | Freq: Every day | ORAL | 0 refills | Status: DC
  Filled 2024-06-06: qty 30, 30d supply, fill #0

## 2024-06-06 MED ORDER — ADULT MULTIVITAMIN W/MINERALS CH
1.0000 | ORAL_TABLET | Freq: Every day | ORAL | 0 refills | Status: AC
  Filled 2024-06-06: qty 30, 30d supply, fill #0

## 2024-06-06 MED ORDER — LIDOCAINE 5 % EX PTCH
1.0000 | MEDICATED_PATCH | CUTANEOUS | 0 refills | Status: AC
  Filled 2024-06-06: qty 30, 30d supply, fill #0

## 2024-06-06 MED ORDER — BISACODYL 10 MG RE SUPP
10.0000 mg | Freq: Every day | RECTAL | 0 refills | Status: DC | PRN
  Filled 2024-06-06: qty 12, 12d supply, fill #0

## 2024-06-06 MED ORDER — ACETAMINOPHEN 500 MG PO TABS
1000.0000 mg | ORAL_TABLET | Freq: Four times a day (QID) | ORAL | 0 refills | Status: AC | PRN
  Filled 2024-06-06: qty 30, 4d supply, fill #0

## 2024-06-06 MED ORDER — FOLIC ACID 1 MG PO TABS
1.0000 mg | ORAL_TABLET | Freq: Every day | ORAL | 0 refills | Status: AC
  Filled 2024-06-06: qty 30, 30d supply, fill #0

## 2024-06-06 NOTE — Progress Notes (Signed)
 Report has been given to Rachael, LPN at [facility]. All questions answered.

## 2024-06-06 NOTE — Progress Notes (Signed)
 Pt leaving with [facility]. All lines removed, and discharge instructions reviewed. Vitals stable for DC. Pt A&O x4, daughter aware of leaving.

## 2024-06-06 NOTE — Plan of Care (Signed)

## 2024-06-06 NOTE — TOC Transition Note (Signed)
 Transition of Care [facility]) - Discharge Note   Patient Details  Name: Lindsey [name] MRN: [id number] Date of Birth: 18-Aug-1956  Transition of [facility]) CM/SW Contact:  Almarie CHRISTELLA Goodie, LCSW Phone Number: 06/06/2024, 1:56 PM   Clinical Narrative:   Patient received insurance approval to admit to [facility], and they have a bed available. CSW updated MD, sent discharge information to [facility]. CSW spoke with daughter, Thurnell, she is in agreement. Transport arranged with [facility] for next available.  Nurse to call report to Vernell at [phone], Room 216.  CSW also received call from [facility]PS, report has been picked up for investigation. They will continue to follow at [facility].    Final next level of care: Skilled Nursing Facility Barriers to Discharge: Barriers Resolved   Patient Goals and CMS Choice Patient states their goals for this hospitalization and ongoing recovery are:: patient unable to participate in goal setting, not fully oriented CMS Medicare.gov Compare Post Acute Care list provided to:: Patient Represenative (must comment) Choice offered to / list presented to : Adult Children [place] ownership interest in [facility].provided to:: Adult Children    Discharge Placement              Patient chooses bed at: [facility] Patient to be transferred to facility by: [facility] Name of family member notified: [name] Patient and family notified of of transfer: 06/06/24  Discharge Plan and Services Additional resources added to the After Visit Summary for       Post Acute Care Choice: Skilled Nursing Facility                               Social Drivers of Health (SDOH) Interventions SDOH Screenings   Food Insecurity: Food Insecurity Present (06/05/2024)  Housing: High Risk (06/05/2024)  Transportation Needs: Unmet Transportation Needs (06/05/2024)  Utilities: Not At Risk (06/05/2024)  Social Connections: Unknown (06/05/2024)  Recent  Concern: Social Connections - Moderately Isolated (03/12/2024)  Tobacco Use: High Risk (06/04/2024)     Readmission Risk Interventions    03/13/2024   12:02 PM 09/09/2022   12:02 PM 09/08/2022    1:22 PM  Readmission Risk Prevention Plan  Transportation Screening Complete Complete Complete  PCP or Specialist Appt within 5-7 Days  Complete Complete  PCP or Specialist Appt within 3-5 Days Complete    Home Care Screening  Complete Complete  Medication Review (RN CM)  Complete Complete  Social Work Consult for Recovery Care Planning/Counseling Complete    Palliative Care Screening Not Applicable    Medication Review Oceanographer) Complete

## 2024-06-09 LAB — CULTURE, BLOOD (ROUTINE X 2)
Culture: NO GROWTH
Culture: NO GROWTH
Special Requests: ADEQUATE

## 2024-06-11 ENCOUNTER — Ambulatory Visit: Payer: Self-pay | Admitting: Internal Medicine

## 2024-06-16 NOTE — Progress Notes (Signed)
 Documentation clarification:  Patient has Acute Hypoxic respiratory failure, identified by O2 Sat of 82% on room air which required supplemental O2.  Rosan Dayton BROCKS, DO

## 2024-06-21 ENCOUNTER — Ambulatory Visit

## 2024-06-27 ENCOUNTER — Ambulatory Visit

## 2024-07-23 ENCOUNTER — Ambulatory Visit

## 2024-08-09 ENCOUNTER — Ambulatory Visit

## 2024-08-09 VITALS — BP 132/72 | HR 102 | Temp 97.9°F | Ht 68.0 in | Wt 187.0 lb

## 2024-08-09 DIAGNOSIS — J449 Chronic obstructive pulmonary disease, unspecified: Secondary | ICD-10-CM

## 2024-08-09 DIAGNOSIS — Z87891 Personal history of nicotine dependence: Secondary | ICD-10-CM

## 2024-08-09 DIAGNOSIS — R0602 Shortness of breath: Secondary | ICD-10-CM

## 2024-08-09 DIAGNOSIS — Z09 Encounter for follow-up examination after completed treatment for conditions other than malignant neoplasm: Secondary | ICD-10-CM

## 2024-08-09 DIAGNOSIS — R000 Tachycardia, unspecified: Secondary | ICD-10-CM

## 2024-08-09 DIAGNOSIS — R911 Solitary pulmonary nodule: Secondary | ICD-10-CM | POA: Diagnosis not present

## 2024-08-09 MED ORDER — IPRATROPIUM-ALBUTEROL 0.5-2.5 (3) MG/3ML IN SOLN: 3.0000 mL | Freq: Four times a day (QID) | RESPIRATORY_TRACT | 1 refills | Status: DC | PRN

## 2024-08-09 NOTE — Progress Notes (Signed)
 New Patient Pulmonology Office Visit   Subjective:  Patient ID: Lindsey [name], female    DOB: 1955-10-28  MRN: [id number]  Referred by: Campbell Reynolds, NP  CC:  Chief Complaint  Patient presents with   Consult    COPD, emphysema, lung nodule      HPI Lindsey [name] is a 68 y.o. female who is here to establish care.  Discussed the use of AI scribe software for clinical note transcription with the patient, who gave verbal consent to proceed.  History of Present Illness Lindsey [name] is a 68 year old female with emphysema who presents with shortness of breath. Pt has hx of dementia. History is corroborated through the phone by her daughter, who is also her primary caregiver. She was referred by her primary care doctor for evaluation of a lung spot and breathing difficulties.  She has a history of emphysema and experienced increased shortness of breath in September, coinciding with a hospital visit for low oxygen levels and altered mental status. She quit smoking in August, which led to some improvement in her breathing. She has never used oxygen at home but has been on oxygen during hospital stays. She uses an albuterol  inhaler as needed and Breo once daily, although she feels the [place] does not help much. However daughter reports breo has helped her overall. She experiences coughing and wheezing, especially when active, and often overexerts herself, leading to shortness of breath.  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 During a seizure in September, she may have aspirated, which was noted on a CT scan as changes in the lower part of her lung. She takes medication for her seizures, although specific medications are not mentioned.  A CT scan from May 2025 showed a spot on the upper part of her right lung, which was still present in a follow-up scan in September 2025. The spot has not grown significantly between scans.  She experiences swelling  in her legs, for which she wears compression socks. She denies excessive salt intake, except when cooked by her son-in-law. She does not have a nebulizer at home but has received breathing treatments in the hospital.  She has a family history of asthma, with both her daughter and herself having the condition. She also reports having allergies, which exacerbate her breathing issues. She does not have any known heart problems. She quit smoking in August 2025 and has a history of working in various jobs, including in mills and bathrooms.  She lives with her daughter.     ROS Review of symptoms negative except mentioned above   Allergies: Iodinated contrast media, Oxycodone , Ibuprofen , Kiwi extract, Morphine  and codeine , Penicillins, and Strawberry extract  Current Outpatient Medications:    acetaminophen  (TYLENOL ) 500 MG tablet, Take 2 tablets (1,000 mg total) by mouth every 6 (six) hours as needed for mild pain (pain score 1-3) or moderate pain (pain score 4-6)., Disp: 30 tablet, Rfl: 0   albuterol  (VENTOLIN  HFA) 108 (90 Base) MCG/ACT inhaler, Inhale 2 puffs into the lungs every 4 (four) hours as needed for wheezing or shortness of breath., Disp: , Rfl:    BREO ELLIPTA  100-25 MCG/ACT AEPB, Inhale 1 puff into the lungs daily., Disp: , Rfl:    cyclobenzaprine  (FLEXERIL ) 10 MG tablet, Take 10 mg by mouth daily as needed for muscle spasms., Disp: , Rfl:    divalproex  (DEPAKOTE  ER) 500 MG 24 hr tablet, Take 500 mg by mouth daily.,  Disp: , Rfl:    escitalopram (LEXAPRO) 5 MG tablet, Take 5 mg by mouth at bedtime., Disp: , Rfl:    ferrous sulfate 325 (65 FE) MG tablet, Take 325 mg by mouth daily with breakfast., Disp: , Rfl:    gabapentin  (NEURONTIN ) 100 MG capsule, Take 100 mg by mouth 3 (three) times daily., Disp: , Rfl:    hydrochlorothiazide  (HYDRODIURIL ) 25 MG tablet, Take 25 mg by mouth daily., Disp: , Rfl:    ipratropium-albuterol  (DUONEB) 0.5-2.5 (3) MG/3ML SOLN, Take 3 mLs by nebulization  every 6 (six) hours as needed., Disp: 360 mL, Rfl: 1   levETIRAcetam  (KEPPRA ) 1000 MG tablet, Take 1,000 mg by mouth in the morning and at bedtime., Disp: , Rfl:    lidocaine  (LIDODERM ) 5 %, Place 1 patch onto the skin daily. Remove & Discard patch within 12 hours or as directed by MD, Disp: 30 patch, Rfl: 0   loratadine  (CLARITIN ) 10 MG tablet, Take 1 tablet (10 mg total) by mouth at bedtime., Disp: , Rfl:    mirtazapine  (REMERON ) 7.5 MG tablet, Take 7.5 mg by mouth at bedtime., Disp: , Rfl:    Multiple Vitamin (MULTIVITAMIN WITH MINERALS) TABS tablet, Take 1 tablet by mouth daily., Disp: 30 tablet, Rfl: 0   pantoprazole  (PROTONIX ) 40 MG tablet, Take 1 tablet (40 mg total) by mouth daily., Disp: 90 tablet, Rfl: 0   rosuvastatin  (CRESTOR ) 5 MG tablet, Take 5 mg by mouth at bedtime., Disp: , Rfl:    [Paused] traMADol  (ULTRAM ) 50 MG tablet, Take 25 mg by mouth 2 (two) times daily as needed (for pain)., Disp: , Rfl:    amLODipine  (NORVASC ) 5 MG tablet, Take 5 mg by mouth daily. (Patient not taking: Reported on 08/09/2024), Disp: , Rfl:    folic acid  (FOLVITE ) 1 MG tablet, Take 1 tablet (1 mg total) by mouth daily. (Patient not taking: Reported on 08/09/2024), Disp: 30 tablet, Rfl: 0 Past Medical History:  Diagnosis Date   Arthritis    Asthma    Drug abuse (HCC)    recovered   GERD (gastroesophageal reflux disease)    H/O ETOH abuse    HTN (hypertension)    Seizure (HCC)    Past Surgical History:  Procedure Laterality Date   ABDOMINAL HYSTERECTOMY     BIOPSY  09/14/2022   Procedure: BIOPSY;  Surgeon: Aneita Gwendlyn DASEN, MD;  Location: [facility];  Service: Gastroenterology;;   COLONOSCOPY WITH PROPOFOL  N/A 03/18/2022   Procedure: COLONOSCOPY WITH PROPOFOL ;  Surgeon: Teressa Toribio SQUIBB, MD;  Location: [facility];  Service: Gastroenterology;  Laterality: N/A;   ESOPHAGOGASTRODUODENOSCOPY (EGD) WITH PROPOFOL  N/A 09/14/2022   Procedure: ESOPHAGOGASTRODUODENOSCOPY (EGD) WITH PROPOFOL ;  Surgeon:  Aneita Gwendlyn DASEN, MD;  Location: [facility];  Service: Gastroenterology;  Laterality: N/A;   GALLBLADDER SURGERY     peptic ulcer surgery     POLYPECTOMY  03/18/2022   Procedure: POLYPECTOMY;  Surgeon: Teressa Toribio SQUIBB, MD;  Location: [facility];  Service: Gastroenterology;;   HARLEY DILATION N/A 09/14/2022   Procedure: HARLEY DILATION;  Surgeon: Aneita Gwendlyn DASEN, MD;  Location: [facility];  Service: Gastroenterology;  Laterality: N/A;   Family History  Problem Relation Age of Onset   Seizures Mother    Alcohol abuse Mother    Heart attack Father    Cancer Daughter        type unknown   Diabetes Daughter    Hypertension Daughter    Drug abuse Brother    Heart attack Brother  Pancreatic cancer Brother    BRCA 1/2 Neg Hx    Breast cancer Neg Hx    Social History   Socioeconomic History   Marital status: Legally Separated    Spouse name: Not on file   Number of children: 2   Years of education: 12   Highest education level: Not on file  Occupational History   Occupation: Disability  Tobacco Use   Smoking status: Some Days    Types: Cigarettes   Smokeless tobacco: Never   Tobacco comments:    Quit 04/2024  Vaping Use   Vaping status: Never Used  Substance and Sexual Activity   Alcohol use: No    Comment: Hx of alchol abuse, not currently drinking   Drug use: Not Currently    Types: Marijuana    Comment: every now and then   Sexual activity: Not on file  Other Topics Concern   Not on file  Social History Narrative   Lives at home w/ her daughter   Right-handed   Caffeine: several coffees and sodas daily   Social Drivers of Corporate Investment Banker Strain: Not on file  Food Insecurity: Food Insecurity Present (06/05/2024)   Hunger Vital Sign    Worried About Running Out of Food in the Last Year: Sometimes true    Ran Out of Food in the Last Year: Sometimes true  Transportation Needs: Unmet Transportation Needs (06/05/2024)   PRAPARE - Therapist, Art (Medical): Yes    Lack of Transportation (Non-Medical): No  Physical Activity: Not on file  Stress: Not on file  Social Connections: Unknown (06/05/2024)   Social Connection and Isolation Panel    Frequency of Communication with Friends and Family: Once a week    Frequency of Social Gatherings with Friends and Family: Once a week    Attends Religious Services: More than 4 times per year    Active Member of [employer] or Organizations: Yes    Attends Banker Meetings: Never    Marital Status: Patient declined  Recent Concern: Social Connections - Moderately Isolated (03/12/2024)   Social Connection and Isolation Panel    Frequency of Communication with Friends and Family: Once a week    Frequency of Social Gatherings with Friends and Family: Once a week    Attends Religious Services: More than 4 times per year    Active Member of [employer] or Organizations: Yes    Attends Banker Meetings: Never    Marital Status: Never married  Intimate Partner Violence: Not At Risk (06/05/2024)   Humiliation, Afraid, Rape, and Kick questionnaire    Fear of Current or Ex-Partner: No    Emotionally Abused: No    Physically Abused: No    Sexually Abused: No         Objective:  BP 132/72 (BP Location: Right Arm, Patient Position: Sitting)   Pulse (!) 102   Temp 97.9 F (36.6 C) (Oral)   Ht 5' 8 (1.727 m)   Wt 187 lb (84.8 kg)   SpO2 94% Comment: RA  BMI 28.43 kg/m    Physical Exam Constitutional:      General: She is not in acute distress.    Appearance: Normal appearance.  HENT:     Mouth/Throat:     Mouth: Mucous membranes are moist.  Cardiovascular:     Rate and Rhythm: Normal rate.  Pulmonary:     Effort: No respiratory distress.     Breath  sounds: No wheezing or rales.     Comments: Decreased breath sounds b/l chest Musculoskeletal:     Right lower leg: No edema.     Left lower leg: No edema.  Skin:    General: Skin is warm.   Neurological:     Mental Status: She is alert and oriented to person, place, and time.  Psychiatric:        Mood and Affect: Mood normal.     Diagnostic Review:    Pft     No data to display               Results RADIOLOGY personally reviewed Chest CT (05/2024): Emphysema, nodule in the apex of the right lung, changes in the lower lung suggestive of aspiration. Volume loss and bandlike atelectasis in the right lower lobe with airway plugging in both lower lobes.  Chest CT (01/2024): Emphysema, nodule in the apex of the lung. Chest CT (08/2022): Nodule in the right upper  lung.    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   For home use only DME Nebulizer machine  Former smoker Congratulated on quitting smoking    Nodule of right lung 7 mm nodule Present in 02/2024 and 05/2024 scan Upon closer look, this nodule was present in 2023 ct chest as well Given significant smoking hx, upper lobe location and spiculated appearance will need closer follow up Will plan for ct chest in around 11/2024    SOB (shortness of breath) Likely multifactorial Deconditioning, copd, likely asthma and ?HFpEF    Hospital discharge follow-up Back to baseline Oxygen saturation 94% today      Tachycardia When she walked into the clinic HR settled down after she had rested a while Likely due to deconditioning      Thank you for the opportunity to take part in the care of Lindsey [name]   Return  in about 3 weeks (around 08/30/2024).   Gabrien Mentink Pleas, MD [facility] Office: [phone]

## 2024-08-09 NOTE — Addendum Note (Signed)
 Addended by: ROLANDA POWELL SAILOR on: 08/09/2024 04:26 PM   Modules accepted: Orders

## 2024-08-09 NOTE — Patient Instructions (Addendum)
 It was a pleasure to see you today. Your pulmonary function test will be scheduled closer to your next follow up visit.

## 2024-08-09 NOTE — Progress Notes (Deleted)
 New Patient Pulmonology Office Visit   Subjective:  Patient ID: Lindsey [name], female    DOB: 1956-09-04  MRN: [id number]  Referred by: Campbell Reynolds, NP  CC:  Chief Complaint  Patient presents with   Consult    COPD, emphysema, lung nodule      HPI Lindsey [name] is a 68 y.o. female with ***  Discussed the use of AI scribe software for clinical note transcription with the patient, who gave verbal consent to proceed.  History of Present Illness      {PULM QUESTIONNAIRES (Optional):33196}  ROS  Allergies: Iodinated contrast media, Oxycodone , Ibuprofen , Kiwi extract, Morphine  and codeine , Penicillins, and Strawberry extract  Current Outpatient Medications:    acetaminophen  (TYLENOL ) 500 MG tablet, Take 2 tablets (1,000 mg total) by mouth every 6 (six) hours as needed for mild pain (pain score 1-3) or moderate pain (pain score 4-6)., Disp: 30 tablet, Rfl: 0   albuterol  (VENTOLIN  HFA) 108 (90 Base) MCG/ACT inhaler, Inhale 2 puffs into the lungs every 4 (four) hours as needed for wheezing or shortness of breath., Disp: , Rfl:    BREO ELLIPTA  100-25 MCG/ACT AEPB, Inhale 1 puff into the lungs daily., Disp: , Rfl:    cyclobenzaprine  (FLEXERIL ) 10 MG tablet, Take 10 mg by mouth daily as needed for muscle spasms., Disp: , Rfl:    divalproex  (DEPAKOTE  ER) 500 MG 24 hr tablet, Take 500 mg by mouth daily., Disp: , Rfl:    escitalopram (LEXAPRO) 5 MG tablet, Take 5 mg by mouth at bedtime., Disp: , Rfl:    ferrous sulfate 325 (65 FE) MG tablet, Take 325 mg by mouth daily with breakfast., Disp: , Rfl:    gabapentin  (NEURONTIN ) 100 MG capsule, Take 100 mg by mouth 3 (three) times daily., Disp: , Rfl:    hydrochlorothiazide  (HYDRODIURIL ) 25 MG tablet, Take 25 mg by mouth daily., Disp: , Rfl:    ipratropium-albuterol  (DUONEB) 0.5-2.5 (3) MG/3ML SOLN, Take 3 mLs by nebulization every 6 (six) hours as needed., Disp: 360 mL, Rfl: 1   levETIRAcetam  (KEPPRA ) 1000 MG tablet, Take 1,000 mg by  mouth in the morning and at bedtime., Disp: , Rfl:    lidocaine  (LIDODERM ) 5 %, Place 1 patch onto the skin daily. Remove & Discard patch within 12 hours or as directed by MD, Disp: 30 patch, Rfl: 0   loratadine  (CLARITIN ) 10 MG tablet, Take 1 tablet (10 mg total) by mouth at bedtime., Disp: , Rfl:    mirtazapine  (REMERON ) 7.5 MG tablet, Take 7.5 mg by mouth at bedtime., Disp: , Rfl:    Multiple Vitamin (MULTIVITAMIN WITH MINERALS) TABS tablet, Take 1 tablet by mouth daily., Disp: 30 tablet, Rfl: 0   pantoprazole  (PROTONIX ) 40 MG tablet, Take 1 tablet (40 mg total) by mouth daily., Disp: 90 tablet, Rfl: 0   rosuvastatin  (CRESTOR ) 5 MG tablet, Take 5 mg by mouth at bedtime., Disp: , Rfl:    [Paused] traMADol  (ULTRAM ) 50 MG tablet, Take 25 mg by mouth 2 (two) times daily as needed (for pain)., Disp: , Rfl:    amLODipine  (NORVASC ) 5 MG tablet, Take 5 mg by mouth daily. (Patient not taking: Reported on 08/09/2024), Disp: , Rfl:    folic acid  (FOLVITE ) 1 MG tablet, Take 1 tablet (1 mg total) by mouth daily. (Patient not taking: Reported on 08/09/2024), Disp: 30 tablet, Rfl: 0 Past Medical History:  Diagnosis Date   Arthritis    Asthma    Drug abuse (HCC)    recovered  GERD (gastroesophageal reflux disease)    H/O ETOH abuse    HTN (hypertension)    Seizure [facility])    Past Surgical History:  Procedure Laterality Date   ABDOMINAL HYSTERECTOMY     BIOPSY  09/14/2022   Procedure: BIOPSY;  Surgeon: Aneita Gwendlyn DASEN, MD;  Location: [facility];  Service: Gastroenterology;;   COLONOSCOPY WITH PROPOFOL  N/A 03/18/2022   Procedure: COLONOSCOPY WITH PROPOFOL ;  Surgeon: Teressa Toribio SQUIBB, MD;  Location: [facility];  Service: Gastroenterology;  Laterality: N/A;   ESOPHAGOGASTRODUODENOSCOPY (EGD) WITH PROPOFOL  N/A 09/14/2022   Procedure: ESOPHAGOGASTRODUODENOSCOPY (EGD) WITH PROPOFOL ;  Surgeon: Aneita Gwendlyn DASEN, MD;  Location: [facility];  Service: Gastroenterology;  Laterality: N/A;   GALLBLADDER  SURGERY     peptic ulcer surgery     POLYPECTOMY  03/18/2022   Procedure: POLYPECTOMY;  Surgeon: Teressa Toribio SQUIBB, MD;  Location: [facility];  Service: Gastroenterology;;   HARLEY DILATION N/A 09/14/2022   Procedure: HARLEY DILATION;  Surgeon: Aneita Gwendlyn DASEN, MD;  Location: [facility];  Service: Gastroenterology;  Laterality: N/A;   Family History  Problem Relation Age of Onset   Seizures Mother    Alcohol abuse Mother    Heart attack Father    Cancer Daughter        type unknown   Diabetes Daughter    Hypertension Daughter    Drug abuse Brother    Heart attack Brother    Pancreatic cancer Brother    BRCA 1/2 Neg Hx    Breast cancer Neg Hx    Social History   Socioeconomic History   Marital status: Legally Separated    Spouse name: Not on file   Number of children: 2   Years of education: 12   Highest education level: Not on file  Occupational History   Occupation: Disability  Tobacco Use   Smoking status: Some Days    Types: Cigarettes   Smokeless tobacco: Never   Tobacco comments:    Quit 04/2024  Vaping Use   Vaping status: Never Used  Substance and Sexual Activity   Alcohol use: No    Comment: Hx of alchol abuse, not currently drinking   Drug use: Not Currently    Types: Marijuana    Comment: every now and then   Sexual activity: Not on file  Other Topics Concern   Not on file  Social History Narrative   Lives at home w/ her daughter   Right-handed   Caffeine: several coffees and sodas daily   Social Drivers of Corporate Investment Banker Strain: Not on file  Food Insecurity: Food Insecurity Present (06/05/2024)   Hunger Vital Sign    Worried About Running Out of Food in the Last Year: Sometimes true    Ran Out of Food in the Last Year: Sometimes true  Transportation Needs: Unmet Transportation Needs (06/05/2024)   PRAPARE - Administrator, Civil Service (Medical): Yes    Lack of Transportation (Non-Medical): No  Physical Activity: Not  on file  Stress: Not on file  Social Connections: Unknown (06/05/2024)   Social Connection and Isolation Panel    Frequency of Communication with Friends and Family: Once a week    Frequency of Social Gatherings with Friends and Family: Once a week    Attends Religious Services: More than 4 times per year    Active Member of [employer] or Organizations: Yes    Attends Banker Meetings: Never    Marital Status: Patient declined  Recent Concern: Social Connections - Moderately Isolated (03/12/2024)   Social Connection and Isolation Panel    Frequency of Communication with Friends and Family: Once a week    Frequency of Social Gatherings with Friends and Family: Once a week    Attends Religious Services: More than 4 times per year    Active Member of [employer] or Organizations: Yes    Attends Banker Meetings: Never    Marital Status: Never married  Intimate Partner Violence: Not At Risk (06/05/2024)   Humiliation, Afraid, Rape, and Kick questionnaire    Fear of Current or Ex-Partner: No    Emotionally Abused: No    Physically Abused: No    Sexually Abused: No         Objective:  BP 132/72 (BP Location: Right Arm, Patient Position: Sitting)   Pulse (!) 102   Temp 97.9 F (36.6 C) (Oral)   Ht 5' 8 (1.727 m)   Wt 187 lb (84.8 kg)   SpO2 94% Comment: RA  BMI 28.43 kg/m  {Pulm Vitals (Optional):32837}  Physical Exam  Diagnostic Review:  {Labs (Optional):32838} Eosinophils 100 absolute 05/2024  Pft     No data to display             Reviewed CT chest 02/2024, 05/2024, 08/2022  Results   Echo 2016 - Left ventricle: The cavity size was normal. Systolic function was    normal. The estimated ejection fraction was in the range of 55%    to 60%. Wall motion was normal; there were no regional wall    motion abnormalities.  - Pulmonary arteries: Systolic pressure was mildly increased. PA    peak pressure: 33 mm Hg (S).      Assessment & Plan:    Assessment & Plan Chronic obstructive pulmonary disease, unspecified COPD type (HCC)  Orders:   Pulmonary function test; Future  Former smoker     Nodule of right lung     SOB (shortness of breath)     Hospital discharge follow-up       Thank you for the opportunity to take part in the care of Lindsey [name]   Return in about 3 weeks (around 08/30/2024).   Lindsey Rentfrow Pleas, MD [facility] Office: [phone]

## 2024-08-09 NOTE — Assessment & Plan Note (Deleted)
 Orders:    Pulmonary function test; Future

## 2024-08-09 NOTE — Assessment & Plan Note (Addendum)
 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   For home use only DME Nebulizer machine

## 2024-09-12 ENCOUNTER — Emergency Department (HOSPITAL_COMMUNITY)

## 2024-09-12 ENCOUNTER — Other Ambulatory Visit: Payer: Self-pay

## 2024-09-12 ENCOUNTER — Emergency Department (HOSPITAL_COMMUNITY)
Admission: EM | Admit: 2024-09-12 | Discharge: 2024-09-13 | Disposition: A | Discharge status: Home / Self Care | Attending: Emergency Medicine | Admitting: Emergency Medicine

## 2024-09-12 ENCOUNTER — Encounter (HOSPITAL_COMMUNITY): Payer: Self-pay

## 2024-09-12 DIAGNOSIS — R000 Tachycardia, unspecified: Secondary | ICD-10-CM | POA: Diagnosis not present

## 2024-09-12 DIAGNOSIS — R1084 Generalized abdominal pain: Secondary | ICD-10-CM | POA: Diagnosis not present

## 2024-09-12 DIAGNOSIS — R1030 Lower abdominal pain, unspecified: Secondary | ICD-10-CM | POA: Diagnosis present

## 2024-09-12 LAB — URINALYSIS, ROUTINE W REFLEX MICROSCOPIC
Bacteria, UA: NONE SEEN
Bilirubin Urine: NEGATIVE
Glucose, UA: NEGATIVE mg/dL
Hgb urine dipstick: NEGATIVE
Ketones, ur: NEGATIVE mg/dL
Nitrite: NEGATIVE
Protein, ur: NEGATIVE mg/dL
Specific Gravity, Urine: 1.017 (ref 1.005–1.030)
pH: 5 (ref 5.0–8.0)

## 2024-09-12 LAB — COMPREHENSIVE METABOLIC PANEL WITH GFR
ALT: 18 U/L (ref 0–44)
AST: 32 U/L (ref 15–41)
Albumin: 4.3 g/dL (ref 3.5–5.0)
Alkaline Phosphatase: 164 U/L — ABNORMAL HIGH (ref 38–126)
Anion gap: 16 — ABNORMAL HIGH (ref 5–15)
BUN: 22 mg/dL (ref 8–23)
CO2: 25 mmol/L (ref 22–32)
Calcium: 10.6 mg/dL — ABNORMAL HIGH (ref 8.9–10.3)
Chloride: 98 mmol/L (ref 98–111)
Creatinine, Ser: 1.03 mg/dL — ABNORMAL HIGH (ref 0.44–1.00)
GFR, Estimated: 59 mL/min — ABNORMAL LOW (ref >60)
Glucose, Bld: 95 mg/dL (ref 70–99)
Potassium: 3.9 mmol/L (ref 3.5–5.1)
Sodium: 138 mmol/L (ref 135–145)
Total Bilirubin: 0.7 mg/dL (ref 0.0–1.2)
Total Protein: 9.5 g/dL — ABNORMAL HIGH (ref 6.5–8.1)

## 2024-09-12 LAB — CBC
HCT: 52.6 % — ABNORMAL HIGH (ref 36.0–46.0)
Hemoglobin: 16.3 g/dL — ABNORMAL HIGH (ref 12.0–15.0)
MCH: 28.1 pg (ref 26.0–34.0)
MCHC: 31 g/dL (ref 30.0–36.0)
MCV: 90.5 fL (ref 80.0–100.0)
Platelets: 461 K/uL — ABNORMAL HIGH (ref 150–400)
RBC: 5.81 MIL/uL — ABNORMAL HIGH (ref 3.87–5.11)
RDW: 13.7 % (ref 11.5–15.5)
WBC: 11 K/uL — ABNORMAL HIGH (ref 4.0–10.5)
nRBC: 0 % (ref 0.0–0.2)

## 2024-09-12 LAB — LIPASE, BLOOD: Lipase: 22 U/L (ref 11–51)

## 2024-09-12 MED ORDER — ONDANSETRON HCL 4 MG/2ML IJ SOLN
4.0000 mg | Freq: Once | INTRAMUSCULAR | Status: AC
  Administered 2024-09-12: 21:00:00 4 mg via INTRAVENOUS
  Filled 2024-09-12: qty 2

## 2024-09-12 MED ORDER — POLYETHYLENE GLYCOL 3350 17 G PO PACK: 17.0000 g | PACK | Freq: Every day | ORAL | 0 refills | Status: AC | PRN

## 2024-09-12 MED ORDER — HYDROMORPHONE HCL 1 MG/ML IJ SOLN
1.0000 mg | Freq: Once | INTRAMUSCULAR | Status: AC
  Administered 2024-09-12: 21:00:00 1 mg via INTRAVENOUS
  Filled 2024-09-12: qty 1

## 2024-09-12 MED ORDER — SODIUM CHLORIDE 0.9 % IV BOLUS
1000.0000 mL | Freq: Once | INTRAVENOUS | Status: AC
  Administered 2024-09-12: 21:00:00 1000 mL via INTRAVENOUS

## 2024-09-12 NOTE — ED Triage Notes (Addendum)
 BIB EMS from [facility] for abdominal pain for a week, constipation for a week. Pt is passing gas. UC sent pt over for evaluation. Pt has not had any laxative or stool softener

## 2024-09-12 NOTE — ED Provider Notes (Signed)
 [place] EMERGENCY DEPARTMENT AT [facility] Provider Note   CSN: [id number] Arrival date & time: 09/12/24  1552     Patient presents with: Constipation   Lindsey [name] is a 68 y.o. female presented to ED with complaint of constipation.  Patient ports she has had difficulty bowel movements about 7 days which is usual for her.  She reports she passed a very large stool maybe 1 or 2 days ago but cannot exactly recall.  She has been taking laxative gelcaps from her PCP.  She says she is been very nauseated and persistently vomiting.  She went to urgent care today and was told to come to ED for further evaluation.  She has diffuse pain across her lower abdomen.  {Add pertinent medical, surgical, social history, OB history to HPI:32947} HPI     Prior to Admission medications  Medication Sig Start Date End Date Taking? Authorizing Provider  polyethylene glycol (MIRALAX ) 17 g packet Take 17 g by mouth daily as needed. 09/12/24  Yes Ashyia Schraeder, Donnice PARAS, MD  acetaminophen  (TYLENOL ) 500 MG tablet Take 2 tablets (1,000 mg total) by mouth every 6 (six) hours as needed for mild pain (pain score 1-3) or moderate pain (pain score 4-6). 06/06/24   Waymond Cart, MD  albuterol  (VENTOLIN  HFA) 108 (90 Base) MCG/ACT inhaler Inhale 2 puffs into the lungs every 4 (four) hours as needed for wheezing or shortness of breath.    [provider]  amLODipine  (NORVASC ) 5 MG tablet Take 5 mg by mouth daily. Patient not taking: Reported on 08/09/2024    [provider]  BREO ELLIPTA  100-25 MCG/ACT AEPB Inhale 1 puff into the lungs daily.    [provider]  cyclobenzaprine  (FLEXERIL ) 10 MG tablet Take 10 mg by mouth daily as needed for muscle spasms.    [provider]  divalproex  (DEPAKOTE  ER) 500 MG 24 hr tablet Take 500 mg by mouth daily.    [provider]  escitalopram (LEXAPRO) 5 MG tablet Take 5 mg by mouth at bedtime.    [provider]  ferrous  sulfate 325 (65 FE) MG tablet Take 325 mg by mouth daily with breakfast.    [provider]  folic acid  (FOLVITE ) 1 MG tablet Take 1 tablet (1 mg total) by mouth daily. Patient not taking: Reported on 08/09/2024 06/06/24   Waymond Cart, MD  gabapentin  (NEURONTIN ) 100 MG capsule Take 100 mg by mouth 3 (three) times daily.    [provider]  hydrochlorothiazide  (HYDRODIURIL ) 25 MG tablet Take 25 mg by mouth daily.    [provider]  ipratropium-albuterol  (DUONEB) 0.5-2.5 (3) MG/3ML SOLN Take 3 mLs by nebulization every 6 (six) hours as needed. 08/09/24   Baral, Dipti, MD  levETIRAcetam  (KEPPRA ) 1000 MG tablet Take 1,000 mg by mouth in the morning and at bedtime.    [provider]  lidocaine  (LIDODERM ) 5 % Place 1 patch onto the skin daily. Remove & Discard patch within 12 hours or as directed by MD 06/06/24   Waymond Cart, MD  loratadine  (CLARITIN ) 10 MG tablet Take 1 tablet (10 mg total) by mouth at bedtime. 03/15/24   Sebastian Toribio GAILS, MD  mirtazapine  (REMERON ) 7.5 MG tablet Take 7.5 mg by mouth at bedtime.    [provider]  Multiple Vitamin (MULTIVITAMIN WITH MINERALS) TABS tablet Take 1 tablet by mouth daily. 06/06/24   Waymond Cart, MD  pantoprazole  (PROTONIX ) 40 MG tablet Take 1 tablet (40 mg total) by mouth  daily. 03/15/24   Sebastian Toribio GAILS, MD  rosuvastatin  (CRESTOR ) 5 MG tablet Take 5 mg by mouth at bedtime.    [provider]  [Paused] traMADol  (ULTRAM ) 50 MG tablet Take 25 mg by mouth 2 (two) times daily as needed (for pain). Wait to take this until your doctor or other care provider tells you to start again.    [provider]    Allergies: Iodinated contrast media, Oxycodone , Ibuprofen , Kiwi extract, Morphine  and codeine , Penicillins, and Strawberry extract    Review of Systems  Updated Vital Signs BP (!) 148/70   Pulse 92   Temp 98.5 F (36.9 C)   Resp 12   Ht 5' 8 (1.727 m)   Wt 85.3 kg   SpO2 96%   BMI 28.59  kg/m   Physical Exam Constitutional:      General: She is not in acute distress. HENT:     Head: Normocephalic and atraumatic.  Eyes:     Conjunctiva/sclera: Conjunctivae normal.     Pupils: Pupils are equal, round, and reactive to light.  Cardiovascular:     Rate and Rhythm: Regular rhythm. Tachycardia present.  Pulmonary:     Effort: Pulmonary effort is normal. No respiratory distress.  Abdominal:     General: There is no distension.     Tenderness: There is abdominal tenderness.  Skin:    General: Skin is warm and dry.  Neurological:     General: No focal deficit present.     Mental Status: She is alert. Mental status is at baseline.  Psychiatric:        Mood and Affect: Mood normal.        Behavior: Behavior normal.     (all labs ordered are listed, but only abnormal results are displayed) Labs Reviewed  COMPREHENSIVE METABOLIC PANEL WITH GFR - Abnormal; Notable for the following components:      Result Value   Creatinine, Ser 1.03 (*)    Calcium  10.6 (*)    Total Protein 9.5 (*)    Alkaline Phosphatase 164 (*)    GFR, Estimated 59 (*)    Anion gap 16 (*)    All other components within normal limits  CBC - Abnormal; Notable for the following components:   WBC 11.0 (*)    RBC 5.81 (*)    Hemoglobin 16.3 (*)    HCT 52.6 (*)    Platelets 461 (*)    All other components within normal limits  LIPASE, BLOOD  URINALYSIS, ROUTINE W REFLEX MICROSCOPIC    EKG: None  Radiology: CT ABDOMEN PELVIS WO CONTRAST Result Date: 09/12/2024 EXAM: CT ABDOMEN AND PELVIS WITHOUT CONTRAST 09/12/2024 08:43:16 PM TECHNIQUE: CT of the abdomen and pelvis was performed without the administration of intravenous contrast. Multiplanar reformatted images are provided for review. Automated exposure control, iterative reconstruction, and/or weight-based adjustment of the mA/kV was utilized to reduce the radiation dose to as low as reasonably achievable. COMPARISON: 06/04/2024 CLINICAL  HISTORY: Nausea and vomiting. FINDINGS: LOWER CHEST: Lung bases are clear. LIVER: The liver is within normal limits. GALLBLADDER AND BILE DUCTS: The gallbladder has been surgically removed. No biliary ductal dilatation. SPLEEN: No acute abnormality. PANCREAS: No acute abnormality. ADRENAL GLANDS: No acute abnormality. KIDNEYS, URETERS AND BLADDER: Cysts are noted in the kidneys bilaterally, similar to that seen on the prior exam. No follow-up further evaluation of the cystic lesions is recommended. No renal calculi. No obstructive changes are seen. No perinephric or periureteral stranding. The bladder is decompressed.  GI AND BOWEL: Stomach is within normal limits. Small bowel is within normal limits. No obstructive or inflammatory changes of the colon are seen. The appendix is not well visualized. Surgical clips are seen in the right lower quadrant likely related to prior appendectomy. No inflammatory changes are seen to suggest appendicitis. PERITONEUM AND RETROPERITONEUM: No ascites. No free air. VASCULATURE: Aortic calcifications are noted without aneurysmal dilatation. LYMPH NODES: No lymphadenopathy. REPRODUCTIVE ORGANS: The uterus has been surgically removed. BONES AND SOFT TISSUES: No acute osseous abnormality. No focal soft tissue abnormality. IMPRESSION: 1. No acute findings in the abdomen or pelvis. Electronically signed by: Oneil Devonshire MD 09/12/2024 08:52 PM EST RP Workstation: [id number]    {Document cardiac monitor, telemetry assessment procedure when appropriate:32947} Procedures   Medications Ordered in the ED  HYDROmorphone  (DILAUDID ) injection 1 mg (1 mg Intravenous Given 09/12/24 2052)  ondansetron  (ZOFRAN ) injection 4 mg (4 mg Intravenous Given 09/12/24 2054)  sodium chloride  0.9 % bolus 1,000 mL (1,000 mLs Intravenous New Bag/Given 09/12/24 2053)      {Click here for ABCD2, HEART and other calculators REFRESH Note before signing:1}                              Medical Decision  Making Amount and/or Complexity of Data Reviewed Labs: ordered. Radiology: ordered.  Risk Prescription drug management.   This patient presents to the ED with concern for lower abdominal pain. This involves an extensive number of treatment options, and is a complaint that carries with it a high risk of complications and morbidity.  The differential diagnosis includes diverticulitis versus colitis versus ileus versus obstruction versus functional constipation versus pancreatitis versus other  I ordered and personally interpreted labs.  The pertinent results include: White blood cell count 11.0.  Minor anion gap elevation.  LFTs largely unremarkable.  Lipase normal.  I ordered imaging studies including CT abdomen pelvis without contrast given iodine allergies I independently visualized and interpreted imaging which showed no emergent findings I agree with the radiologist interpretation  I ordered medication including IV fluids, pain and nausea medication  I have reviewed the patients home medicines and have made adjustments as needed  Test Considered: low suspicion for PID, pelvic infection; s/p hysterectomy  After the interventions noted above, I reevaluated the patient and found that they have: improved   Disposition:  After consideration of the diagnostic results and the patient's response to treatment, I feel that the patent would benefit from outpatient follow up   {Document critical care time when appropriate  Document review of labs and clinical decision tools ie CHADS2VASC2, etc  Document your independent review of radiology images and any outside records  Document your discussion with family members, caretakers and with consultants  Document social determinants of health affecting pt's care  Document your decision making why or why not admission, treatments were needed:32947:::1}   Final diagnoses:  Generalized abdominal pain    ED Discharge Orders          Ordered     polyethylene glycol (MIRALAX ) 17 g packet  Daily PRN        09/12/24 2322

## 2024-09-12 NOTE — ED Provider Notes (Signed)
 Patient signed out pending urinalysis.  Urinalysis is not consistent with UTI.  Will be discharged per Dr. Axel discharge instructions.   Bari Charmaine FALCON, MD 09/12/24 [phone]

## 2024-09-12 NOTE — ED Notes (Signed)
 Pt given water for PO challenge.

## 2024-09-30 ENCOUNTER — Other Ambulatory Visit (HOSPITAL_BASED_OUTPATIENT_CLINIC_OR_DEPARTMENT_OTHER): Payer: Self-pay | Admitting: Family

## 2024-09-30 DIAGNOSIS — E2839 Other primary ovarian failure: Secondary | ICD-10-CM

## 2024-09-30 DIAGNOSIS — Z780 Asymptomatic menopausal state: Secondary | ICD-10-CM

## 2024-10-05 ENCOUNTER — Ambulatory Visit: Admitting: *Deleted

## 2024-10-05 DIAGNOSIS — J449 Chronic obstructive pulmonary disease, unspecified: Secondary | ICD-10-CM | POA: Diagnosis not present

## 2024-10-05 LAB — PULMONARY FUNCTION TEST
DL/VA % pred: 75 %
DL/VA: 3.04 ml/min/mmHg/L
DLCO cor % pred: 36 %
DLCO cor: 8.2 ml/min/mmHg
DLCO unc % pred: 39 %
DLCO unc: 8.85 ml/min/mmHg
FEF 25-75 Post: 0.38 L/s
FEF 25-75 Pre: 0.32 L/s
FEF2575-%Change-Post: 20 %
FEF2575-%Pred-Post: 16 %
FEF2575-%Pred-Pre: 14 %
FEV1-%Change-Post: 5 %
FEV1-%Pred-Post: 27 %
FEV1-%Pred-Pre: 25 %
FEV1-Post: 0.75 L
FEV1-Pre: 0.71 L
FEV1FVC-%Change-Post: 2 %
FEV1FVC-%Pred-Pre: 67 %
FEV6-%Change-Post: 5 %
FEV6-%Pred-Post: 40 %
FEV6-%Pred-Pre: 38 %
FEV6-Post: 1.41 L
FEV6-Pre: 1.33 L
FEV6FVC-%Change-Post: 2 %
FEV6FVC-%Pred-Post: 104 %
FEV6FVC-%Pred-Pre: 101 %
FVC-%Change-Post: 3 %
FVC-%Pred-Post: 39 %
FVC-%Pred-Pre: 37 %
FVC-Post: 1.41 L
FVC-Pre: 1.36 L
Post FEV1/FVC ratio: 53 %
Post FEV6/FVC ratio: 100 %
Pre FEV1/FVC ratio: 52 %
Pre FEV6/FVC Ratio: 97 %
RV % pred: 353 %
RV: 8.24 L
TLC % pred: 169 %
TLC: 9.57 L

## 2024-10-05 NOTE — Progress Notes (Signed)
 Full PFT performed today.

## 2024-10-05 NOTE — Patient Instructions (Signed)
 Full PFT performed today.

## 2024-10-09 ENCOUNTER — Other Ambulatory Visit: Payer: Self-pay

## 2024-10-09 ENCOUNTER — Ambulatory Visit

## 2024-10-09 VITALS — BP 147/83 | HR 98 | Temp 97.7°F | Ht 68.0 in | Wt 188.0 lb

## 2024-10-09 DIAGNOSIS — J439 Emphysema, unspecified: Secondary | ICD-10-CM

## 2024-10-09 DIAGNOSIS — R911 Solitary pulmonary nodule: Secondary | ICD-10-CM | POA: Diagnosis not present

## 2024-10-09 DIAGNOSIS — Z87891 Personal history of nicotine dependence: Secondary | ICD-10-CM | POA: Diagnosis not present

## 2024-10-09 DIAGNOSIS — J449 Chronic obstructive pulmonary disease, unspecified: Secondary | ICD-10-CM

## 2024-10-09 MED ORDER — IPRATROPIUM-ALBUTEROL 0.5-2.5 (3) MG/3ML IN SOLN: 3.0000 mL | Freq: Four times a day (QID) | RESPIRATORY_TRACT | 1 refills | Status: AC | PRN

## 2024-10-09 NOTE — Assessment & Plan Note (Signed)
 Discussed the symptoms, etiology, pathophysiology, diagnostic test, treatment, flare ups,  prognosis of copd NO BD response Symptoms well controlled on breo Continue breo for now Continue as needed albuterol  and duonebs

## 2024-10-09 NOTE — Patient Instructions (Signed)
 It was a pleasure to see you today. We reviewed the results of your pulmonary function test today which confirmed severe COPD diagnosis Continue to use Breo daily- Please rinse your mouth after inhaler use.  Use albuterol  as needed You will receive a call for CT chest scheduling.

## 2024-10-09 NOTE — Progress Notes (Signed)
 "  New Patient Pulmonology Office Visit   Subjective:  Patient ID: Lindsey [name], female    DOB: May 20, 1956  MRN: [id number]  Referred by: Health, Izell Rubens  CC:  Chief Complaint  Patient presents with   COPD   HPI from 08/09/2024: COPD Her past medical history is significant for COPD.   Lindsey [name] is a 69 y.o. female who is here to establish care.  Discussed the use of AI scribe software for clinical note transcription with the patient, who gave verbal consent to proceed.  History of Present Illness Lindsey [name] is a 69 year old female with emphysema who presents with shortness of breath. Pt has hx of dementia. History is corroborated through the phone by her daughter, who is also her primary caregiver. She was referred by her primary care doctor for evaluation of a lung spot and breathing difficulties.  She has a history of emphysema and experienced increased shortness of breath in September, coinciding with a hospital visit for low oxygen levels and altered mental status. She quit smoking in August, which led to some improvement in her breathing. She has never used oxygen at home but has been on oxygen during hospital stays. She uses an albuterol  inhaler as needed and Breo once daily, although she feels the [place] does not help much. However daughter reports breo has helped her overall. She experiences coughing and wheezing, especially when active, and often overexerts herself, leading to shortness of breath.  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 During a seizure in September, she may have aspirated, which was noted on a CT scan as changes in the lower part of her lung. She takes medication for her seizures, although specific medications are not mentioned.  A CT scan from May 2025 showed a spot on the upper part of her right lung, which was still present in a follow-up scan in September 2025. The spot has not grown significantly between  scans.  She experiences swelling in her legs, for which she wears compression socks. She denies excessive salt intake, except when cooked by her son-in-law. She does not have a nebulizer at home but has received breathing treatments in the hospital.  She has a family history of asthma, with both her daughter and herself having the condition. She also reports having allergies, which exacerbate her breathing issues. She does not have any known heart problems. She quit smoking in August 2025 and has a history of working in various jobs, including in mills and bathrooms.  She lives with her daughter.   Interval hx: 10/09/2024: Denies new /worsening symptoms Uses breo daily Uses albuterol  daily- wasn't sure how often to use it Was in ED Dec 17 for abd pain No pulm related ed visits. Urgent care visits in the interim Completed PFT, here to review results   ROS Review of symptoms negative except mentioned above   Allergies: Iodinated contrast media, Oxycodone , Ibuprofen , Kiwi extract, Morphine  and codeine , Penicillins, and Strawberry extract  Current Outpatient Medications:    acetaminophen  (TYLENOL ) 500 MG tablet, Take 2 tablets (1,000 mg total) by mouth every 6 (six) hours as needed for mild pain (pain score 1-3) or moderate pain (pain score 4-6)., Disp: 30 tablet, Rfl: 0   albuterol  (VENTOLIN  HFA) 108 (90 Base) MCG/ACT inhaler, Inhale 2 puffs into the lungs every 4 (four) hours as needed for wheezing or shortness of breath., Disp: , Rfl:    BREO ELLIPTA  100-25 MCG/ACT AEPB,  Inhale 1 puff into the lungs daily., Disp: , Rfl:    cyclobenzaprine  (FLEXERIL ) 10 MG tablet, Take 10 mg by mouth daily as needed for muscle spasms., Disp: , Rfl:    divalproex  (DEPAKOTE  ER) 500 MG 24 hr tablet, Take 500 mg by mouth daily., Disp: , Rfl:    escitalopram (LEXAPRO) 5 MG tablet, Take 5 mg by mouth at bedtime., Disp: , Rfl:    ferrous sulfate 325 (65 FE) MG tablet, Take 325 mg by mouth daily with breakfast.,  Disp: , Rfl:    gabapentin  (NEURONTIN ) 100 MG capsule, Take 100 mg by mouth 3 (three) times daily., Disp: , Rfl:    hydrochlorothiazide  (HYDRODIURIL ) 25 MG tablet, Take 25 mg by mouth daily., Disp: , Rfl:    ipratropium-albuterol  (DUONEB) 0.5-2.5 (3) MG/3ML SOLN, Take 3 mLs by nebulization every 6 (six) hours as needed., Disp: 360 mL, Rfl: 1   levETIRAcetam  (KEPPRA ) 1000 MG tablet, Take 1,000 mg by mouth in the morning and at bedtime., Disp: , Rfl:    lidocaine  (LIDODERM ) 5 %, Place 1 patch onto the skin daily. Remove & Discard patch within 12 hours or as directed by MD, Disp: 30 patch, Rfl: 0   loratadine  (CLARITIN ) 10 MG tablet, Take 1 tablet (10 mg total) by mouth at bedtime., Disp: , Rfl:    mirtazapine  (REMERON ) 7.5 MG tablet, Take 7.5 mg by mouth at bedtime., Disp: , Rfl:    Multiple Vitamin (MULTIVITAMIN WITH MINERALS) TABS tablet, Take 1 tablet by mouth daily., Disp: 30 tablet, Rfl: 0   pantoprazole  (PROTONIX ) 40 MG tablet, Take 1 tablet (40 mg total) by mouth daily., Disp: 90 tablet, Rfl: 0   polyethylene glycol (MIRALAX ) 17 g packet, Take 17 g by mouth daily as needed., Disp: 14 each, Rfl: 0   rosuvastatin  (CRESTOR ) 5 MG tablet, Take 5 mg by mouth at bedtime., Disp: , Rfl:    [Paused] traMADol  (ULTRAM ) 50 MG tablet, Take 25 mg by mouth 2 (two) times daily as needed (for pain)., Disp: , Rfl:    amLODipine  (NORVASC ) 5 MG tablet, Take 5 mg by mouth daily. (Patient not taking: Reported on 10/09/2024), Disp: , Rfl:    folic acid  (FOLVITE ) 1 MG tablet, Take 1 tablet (1 mg total) by mouth daily. (Patient not taking: Reported on 10/09/2024), Disp: 30 tablet, Rfl: 0 Past Medical History:  Diagnosis Date   Arthritis    Asthma    Drug abuse (HCC)    recovered   GERD (gastroesophageal reflux disease)    H/O ETOH abuse    HTN (hypertension)    Seizure (HCC)    Past Surgical History:  Procedure Laterality Date   ABDOMINAL HYSTERECTOMY     BIOPSY  09/14/2022   Procedure: BIOPSY;  Surgeon:  Aneita Gwendlyn DASEN, MD;  Location: [facility];  Service: Gastroenterology;;   COLONOSCOPY WITH PROPOFOL  N/A 03/18/2022   Procedure: COLONOSCOPY WITH PROPOFOL ;  Surgeon: Teressa Toribio SQUIBB, MD;  Location: [facility];  Service: Gastroenterology;  Laterality: N/A;   ESOPHAGOGASTRODUODENOSCOPY (EGD) WITH PROPOFOL  N/A 09/14/2022   Procedure: ESOPHAGOGASTRODUODENOSCOPY (EGD) WITH PROPOFOL ;  Surgeon: Aneita Gwendlyn DASEN, MD;  Location: [facility];  Service: Gastroenterology;  Laterality: N/A;   GALLBLADDER SURGERY     peptic ulcer surgery     POLYPECTOMY  03/18/2022   Procedure: POLYPECTOMY;  Surgeon: Teressa Toribio SQUIBB, MD;  Location: [facility];  Service: Gastroenterology;;   HARLEY DILATION N/A 09/14/2022   Procedure: HARLEY DILATION;  Surgeon: Aneita Gwendlyn DASEN, MD;  Location: [facility];  Service: Gastroenterology;  Laterality: N/A;   Family History  Problem Relation Age of Onset   Seizures Mother    Alcohol abuse Mother    Heart attack Father    Cancer Daughter        type unknown   Diabetes Daughter    Hypertension Daughter    Drug abuse Brother    Heart attack Brother    Pancreatic cancer Brother    BRCA 1/2 Neg Hx    Breast cancer Neg Hx    Social History   Socioeconomic History   Marital status: Legally Separated    Spouse name: Not on file   Number of children: 2   Years of education: 12   Highest education level: Not on file  Occupational History   Occupation: Disability  Tobacco Use   Smoking status: Some Days    Types: Cigarettes   Smokeless tobacco: Never   Tobacco comments:    Quit 04/2024.  Vaping Use   Vaping status: Never Used  Substance and Sexual Activity   Alcohol use: No    Comment: Hx of alchol abuse, not currently drinking   Drug use: Not Currently    Types: Marijuana    Comment: every now and then   Sexual activity: Not on file  Other Topics Concern   Not on file  Social History Narrative   Lives at home w/ her daughter   Right-handed    Caffeine: several coffees and sodas daily   Social Drivers of Health   Tobacco Use: High Risk (10/09/2024)   Patient History    Smoking Tobacco Use: Some Days    Smokeless Tobacco Use: Never    Passive Exposure: Not on file  Financial Resource Strain: Not on file  Food Insecurity: Food Insecurity Present (06/05/2024)   Epic    Worried About Programme Researcher, Broadcasting/film/video in the Last Year: Sometimes true    [employer] of Food in the Last Year: Sometimes true  Transportation Needs: Unmet Transportation Needs (06/05/2024)   Epic    Lack of Transportation (Medical): Yes    Lack of Transportation (Non-Medical): No  Physical Activity: Not on file  Stress: Not on file  Social Connections: Unknown (06/05/2024)   Social Connection and Isolation Panel    Frequency of Communication with Friends and Family: Once a week    Frequency of Social Gatherings with Friends and Family: Once a week    Attends Religious Services: More than 4 times per year    Active Member of [employer] or Organizations: Yes    Attends Banker Meetings: Never    Marital Status: Patient declined  Recent Concern: Social Connections - Moderately Isolated (03/12/2024)   Social Connection and Isolation Panel    Frequency of Communication with Friends and Family: Once a week    Frequency of Social Gatherings with Friends and Family: Once a week    Attends Religious Services: More than 4 times per year    Active Member of [employer] or Organizations: Yes    Attends Banker Meetings: Never    Marital Status: Never married  Intimate Partner Violence: Not At Risk (06/05/2024)   Epic    Fear of Current or Ex-Partner: No    Emotionally Abused: No    Physically Abused: No    Sexually Abused: No  Depression (PHQ2-9): Not on file  Alcohol Screen: Not on file  Housing: High Risk (06/05/2024)   Epic    Unable to Pay for  Housing in the Last Year: No    Number of Times Moved in the Last Year: 0    Homeless in the Last Year: Yes   Utilities: Not At Risk (06/05/2024)   Epic    Threatened with loss of utilities: No  Health Literacy: Not on file         Objective:  BP (!) 147/83   Pulse 98   Temp 97.7 F (36.5 C) (Oral)   Ht 5' 8 (1.727 m)   Wt 188 lb (85.3 kg)   SpO2 94%   BMI 28.59 kg/m    Physical Exam Constitutional:      General: She is not in acute distress.    Appearance: Normal appearance.  HENT:     Mouth/Throat:     Mouth: Mucous membranes are moist.  Cardiovascular:     Rate and Rhythm: Normal rate.  Pulmonary:     Effort: No respiratory distress.     Breath sounds: No wheezing or rales.     Comments: Decreased breath sounds b/l chest Musculoskeletal:     Right lower leg: No edema.     Left lower leg: No edema.  Skin:    General: Skin is warm.  Neurological:     Mental Status: She is alert and oriented to person, place, and time.  Psychiatric:        Mood and Affect: Mood normal.     Diagnostic Review:    Pft    Latest Ref Rng & Units 10/05/2024    1:00 PM  PFT Results  FVC-Pre L 1.36  P  FVC-Predicted Pre % 37  P  FVC-Post L 1.41  P  FVC-Predicted Post % 39  P  Pre FEV1/FVC % % 52  P  Post FEV1/FCV % % 53  P  FEV1-Pre L 0.71  P  FEV1-Predicted Pre % 25  P  FEV1-Post L 0.75  P  DLCO uncorrected ml/min/mmHg 8.85  P  DLCO UNC% % 39  P  DLCO corrected ml/min/mmHg 8.20  P  DLCO COR %Predicted % 36  P  DLVA Predicted % 75  P  TLC L 9.57  P  TLC % Predicted % 169  P  RV % Predicted % 353  P    P Preliminary result  Severe obstruction, no BD response Evidence of air trapping and hyperinflation Diffusion capacity severely reduced       Results RADIOLOGY personally reviewed Chest CT (05/2024): Emphysema, nodule in the apex of the right lung, changes in the lower lung suggestive of aspiration. Volume loss and bandlike atelectasis in the right lower lobe with airway plugging in both lower lobes.  Chest CT (01/2024): Emphysema, nodule in the apex of the  lung. Chest CT (08/2022): Nodule in the right upper  lung.    Reviewed discharge notes from 05/2024 hospital admission    Assessment & Plan:   Assessment & Plan Nodule of right lung 7 mm nodule Present in 02/2024 and 05/2024 scan Upon closer look, this nodule was present in 2023 ct chest as well Given significant smoking hx, upper lobe location and spiculated appearance will need closer follow up Schedule CT chest now for f/up Orders:   CT SUPER D CHEST WO CONTRAST; Future  Chronic obstructive pulmonary disease with emphysema, unspecified emphysema type (HCC) Discussed the symptoms, etiology, pathophysiology, diagnostic test, treatment, flare ups,  prognosis of copd NO BD response Symptoms well controlled on breo Continue breo for now Continue as needed albuterol  and duonebs  Former smoker 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   Lindsey Yusuf Pleas, MD [facility] Office: [phone] "

## 2024-10-25 ENCOUNTER — Telehealth: Payer: Self-pay

## 2024-10-25 NOTE — Telephone Encounter (Signed)
 As per radiology unable to contact the pt for CT chest. Please attempt to schedule a f/up visit- non urgent.

## 2024-10-25 NOTE — Telephone Encounter (Signed)
 ATC patient x1.  LVM to return call.  She needs a f/u with Dr. Pleas when she calls back after 2/11 CT scan (2 weeks out).  Thank you.  It looks like she is scheduled for her CT scan on 2/11.  Will await return call.

## 2024-10-25 NOTE — Telephone Encounter (Signed)
 Copied from CRM #[id number]. Topic: Appointments - Appointment Scheduling >> Oct 25, 2024  1:32 PM Chantha C wrote: Patient/patient representative is calling to schedule an appointment. Refer to attachments for appointment information.  Patient [phone] is returning the office call, not sure what the call is about. Scheduled F/U CT scan with Dr. Pleas 2 weeks out. Patient has an appointment for  01/07/25 at 2 pm with Dr. Pleas. Unable to reschedule appointment, next available with Dr. Pleas is 01/02/25. Unable to reach [facility], tried a few times, please call back.   Attempted to call pt to offer sooner apt. Left voicemail for pt to call office to get scheduled.

## 2024-11-01 NOTE — Telephone Encounter (Signed)
 Pt has ov 4/13

## 2024-11-07 ENCOUNTER — Other Ambulatory Visit

## 2025-01-07 ENCOUNTER — Ambulatory Visit

## 2025-02-21 ENCOUNTER — Other Ambulatory Visit (HOSPITAL_BASED_OUTPATIENT_CLINIC_OR_DEPARTMENT_OTHER)
